# Patient Record
Sex: Male | Born: 1940 | Race: Black or African American | Hispanic: No | Smoking: Never smoker
Health system: Southern US, Community
[De-identification: ages and names within clinical notes are randomized; demographics above are authoritative.]

## PROBLEM LIST (undated history)

## (undated) DIAGNOSIS — G4730 Sleep apnea, unspecified: Secondary | ICD-10-CM

## (undated) DIAGNOSIS — M109 Gout, unspecified: Secondary | ICD-10-CM

## (undated) DIAGNOSIS — N529 Male erectile dysfunction, unspecified: Secondary | ICD-10-CM

## (undated) DIAGNOSIS — K219 Gastro-esophageal reflux disease without esophagitis: Secondary | ICD-10-CM

## (undated) DIAGNOSIS — E785 Hyperlipidemia, unspecified: Secondary | ICD-10-CM

## (undated) DIAGNOSIS — I10 Essential (primary) hypertension: Secondary | ICD-10-CM

## (undated) DIAGNOSIS — J449 Chronic obstructive pulmonary disease, unspecified: Secondary | ICD-10-CM

## (undated) DIAGNOSIS — I2510 Atherosclerotic heart disease of native coronary artery without angina pectoris: Secondary | ICD-10-CM

## (undated) DIAGNOSIS — K6289 Other specified diseases of anus and rectum: Secondary | ICD-10-CM

## (undated) DIAGNOSIS — R0600 Dyspnea, unspecified: Secondary | ICD-10-CM

## (undated) DIAGNOSIS — E119 Type 2 diabetes mellitus without complications: Secondary | ICD-10-CM

## (undated) DIAGNOSIS — D649 Anemia, unspecified: Secondary | ICD-10-CM

## (undated) DIAGNOSIS — Z87891 Personal history of nicotine dependence: Secondary | ICD-10-CM

## (undated) DIAGNOSIS — F411 Generalized anxiety disorder: Secondary | ICD-10-CM

## (undated) DIAGNOSIS — M1990 Unspecified osteoarthritis, unspecified site: Secondary | ICD-10-CM

## (undated) DIAGNOSIS — K921 Melena: Secondary | ICD-10-CM

## (undated) DIAGNOSIS — J4489 Other specified chronic obstructive pulmonary disease: Secondary | ICD-10-CM

## (undated) DIAGNOSIS — I25810 Atherosclerosis of coronary artery bypass graft(s) without angina pectoris: Secondary | ICD-10-CM

## (undated) HISTORY — PX: KNEE SURGERY: SHX244

## (undated) HISTORY — DX: Gastro-esophageal reflux disease without esophagitis: K21.9

## (undated) HISTORY — DX: Other specified chronic obstructive pulmonary disease: J44.89

## (undated) HISTORY — PX: ROTATOR CUFF REPAIR: SHX139

## (undated) HISTORY — DX: Hyperlipidemia, unspecified: E78.5

## (undated) HISTORY — DX: Generalized anxiety disorder: F41.1

## (undated) HISTORY — DX: Unspecified osteoarthritis, unspecified site: M19.90

## (undated) HISTORY — DX: Type 2 diabetes mellitus without complications: E11.9

## (undated) HISTORY — DX: Essential (primary) hypertension: I10

## (undated) HISTORY — DX: Chronic obstructive pulmonary disease, unspecified: J44.9

## (undated) HISTORY — DX: Personal history of nicotine dependence: Z87.891

## (undated) HISTORY — DX: Male erectile dysfunction, unspecified: N52.9

## (undated) HISTORY — DX: Atherosclerosis of coronary artery bypass graft(s) without angina pectoris: I25.810

## (undated) HISTORY — DX: Gout, unspecified: M10.9

## (undated) HISTORY — DX: Other specified diseases of anus and rectum: K62.89

## (undated) HISTORY — DX: Melena: K92.1

---

## 1995-04-10 HISTORY — PX: CORONARY ARTERY BYPASS GRAFT: SHX141

## 1995-12-05 ENCOUNTER — Encounter: Payer: Self-pay | Admitting: Gastroenterology

## 1995-12-26 ENCOUNTER — Encounter: Payer: Self-pay | Admitting: Gastroenterology

## 2000-03-27 ENCOUNTER — Encounter: Payer: Self-pay | Admitting: Internal Medicine

## 2000-03-27 ENCOUNTER — Inpatient Hospital Stay (HOSPITAL_COMMUNITY): Admission: AD | Admit: 2000-03-27 | Discharge: 2000-03-28 | Payer: Self-pay | Admitting: Internal Medicine

## 2000-03-28 ENCOUNTER — Encounter: Payer: Self-pay | Admitting: Internal Medicine

## 2001-06-19 ENCOUNTER — Ambulatory Visit (HOSPITAL_BASED_OUTPATIENT_CLINIC_OR_DEPARTMENT_OTHER): Admission: RE | Admit: 2001-06-19 | Discharge: 2001-06-19 | Payer: Self-pay | Admitting: Pulmonary Disease

## 2004-02-18 ENCOUNTER — Ambulatory Visit: Payer: Self-pay | Admitting: Pulmonary Disease

## 2004-03-14 ENCOUNTER — Ambulatory Visit: Payer: Self-pay | Admitting: Internal Medicine

## 2004-04-05 ENCOUNTER — Ambulatory Visit: Payer: Self-pay | Admitting: Internal Medicine

## 2004-04-11 ENCOUNTER — Ambulatory Visit: Payer: Self-pay | Admitting: *Deleted

## 2004-04-19 ENCOUNTER — Ambulatory Visit: Payer: Self-pay

## 2004-06-12 ENCOUNTER — Ambulatory Visit: Payer: Self-pay | Admitting: Internal Medicine

## 2004-07-13 ENCOUNTER — Ambulatory Visit: Payer: Self-pay | Admitting: Internal Medicine

## 2004-07-18 ENCOUNTER — Ambulatory Visit: Payer: Self-pay | Admitting: Pulmonary Disease

## 2004-10-12 ENCOUNTER — Ambulatory Visit: Payer: Self-pay | Admitting: Internal Medicine

## 2004-10-16 ENCOUNTER — Ambulatory Visit: Payer: Self-pay | Admitting: *Deleted

## 2004-11-28 ENCOUNTER — Ambulatory Visit: Payer: Self-pay | Admitting: *Deleted

## 2005-01-11 ENCOUNTER — Ambulatory Visit: Payer: Self-pay | Admitting: Internal Medicine

## 2005-01-16 ENCOUNTER — Ambulatory Visit: Payer: Self-pay | Admitting: Pulmonary Disease

## 2005-04-11 ENCOUNTER — Ambulatory Visit: Payer: Self-pay | Admitting: Internal Medicine

## 2005-04-18 ENCOUNTER — Ambulatory Visit: Payer: Self-pay | Admitting: *Deleted

## 2005-04-20 ENCOUNTER — Ambulatory Visit: Payer: Self-pay | Admitting: *Deleted

## 2005-07-17 ENCOUNTER — Ambulatory Visit: Payer: Self-pay | Admitting: Internal Medicine

## 2005-09-12 ENCOUNTER — Ambulatory Visit: Payer: Self-pay | Admitting: Pulmonary Disease

## 2005-10-03 ENCOUNTER — Ambulatory Visit (HOSPITAL_COMMUNITY): Admission: RE | Admit: 2005-10-03 | Discharge: 2005-10-03 | Payer: Self-pay | Admitting: Orthopedic Surgery

## 2005-10-16 ENCOUNTER — Ambulatory Visit: Payer: Self-pay | Admitting: Internal Medicine

## 2005-11-06 ENCOUNTER — Ambulatory Visit: Payer: Self-pay | Admitting: *Deleted

## 2005-11-07 ENCOUNTER — Ambulatory Visit: Payer: Self-pay

## 2005-11-12 ENCOUNTER — Ambulatory Visit: Payer: Self-pay | Admitting: Internal Medicine

## 2005-12-17 ENCOUNTER — Ambulatory Visit: Payer: Self-pay | Admitting: *Deleted

## 2005-12-18 ENCOUNTER — Ambulatory Visit: Payer: Self-pay | Admitting: *Deleted

## 2006-01-28 ENCOUNTER — Ambulatory Visit: Payer: Self-pay | Admitting: *Deleted

## 2006-01-28 LAB — CONVERTED CEMR LAB
ALT: 29 units/L (ref 0–40)
AST: 32 units/L (ref 0–37)
Albumin: 3.9 g/dL (ref 3.5–5.2)
Alkaline Phosphatase: 77 units/L (ref 39–117)
BUN: 8 mg/dL (ref 6–23)
CO2: 31 meq/L (ref 19–32)
Calcium: 8.6 mg/dL (ref 8.4–10.5)
Chloride: 110 meq/L (ref 96–112)
Chol/HDL Ratio, serum: 4.1
Cholesterol: 122 mg/dL (ref 0–200)
Creatinine, Ser: 0.9 mg/dL (ref 0.4–1.5)
GFR calc non Af Amer: 90 mL/min
Glomerular Filtration Rate, Af Am: 109 mL/min/{1.73_m2}
Glucose, Bld: 142 mg/dL — ABNORMAL HIGH (ref 70–99)
HDL: 29.8 mg/dL — ABNORMAL LOW (ref >39.0)
LDL Cholesterol: 70 mg/dL (ref 0–99)
Potassium: 4.1 meq/L (ref 3.5–5.1)
Sodium: 146 meq/L — ABNORMAL HIGH (ref 135–145)
Total Bilirubin: 0.5 mg/dL (ref 0.3–1.2)
Total Protein: 6.3 g/dL (ref 6.0–8.3)
Triglyceride fasting, serum: 109 mg/dL (ref 0–149)
VLDL: 22 mg/dL (ref 0–40)

## 2006-03-04 ENCOUNTER — Ambulatory Visit: Payer: Self-pay | Admitting: Internal Medicine

## 2006-05-17 ENCOUNTER — Ambulatory Visit: Payer: Self-pay | Admitting: *Deleted

## 2006-06-11 ENCOUNTER — Ambulatory Visit: Payer: Self-pay | Admitting: Internal Medicine

## 2006-08-21 ENCOUNTER — Ambulatory Visit: Payer: Self-pay | Admitting: *Deleted

## 2006-08-21 LAB — CONVERTED CEMR LAB
Albumin: 3.8 g/dL (ref 3.5–5.2)
Alkaline Phosphatase: 82 units/L (ref 39–117)
BUN: 9 mg/dL (ref 6–23)
CO2: 32 meq/L (ref 19–32)
Cholesterol: 118 mg/dL (ref 0–200)
GFR calc Af Amer: 109 mL/min
GFR calc non Af Amer: 90 mL/min
HDL: 25.1 mg/dL — ABNORMAL LOW (ref >39.0)
Potassium: 3.6 meq/L (ref 3.5–5.1)
Total Protein: 6.3 g/dL (ref 6.0–8.3)
VLDL: 17 mg/dL (ref 0–40)

## 2006-08-28 ENCOUNTER — Ambulatory Visit: Payer: Self-pay

## 2006-09-11 ENCOUNTER — Ambulatory Visit: Payer: Self-pay | Admitting: Internal Medicine

## 2006-09-13 ENCOUNTER — Ambulatory Visit: Payer: Self-pay | Admitting: Pulmonary Disease

## 2006-12-03 ENCOUNTER — Ambulatory Visit: Payer: Self-pay | Admitting: Internal Medicine

## 2007-02-01 ENCOUNTER — Encounter: Payer: Self-pay | Admitting: Internal Medicine

## 2007-02-01 DIAGNOSIS — M109 Gout, unspecified: Secondary | ICD-10-CM | POA: Insufficient documentation

## 2007-02-01 DIAGNOSIS — K219 Gastro-esophageal reflux disease without esophagitis: Secondary | ICD-10-CM

## 2007-02-01 DIAGNOSIS — E785 Hyperlipidemia, unspecified: Secondary | ICD-10-CM | POA: Insufficient documentation

## 2007-02-01 DIAGNOSIS — M1990 Unspecified osteoarthritis, unspecified site: Secondary | ICD-10-CM

## 2007-02-01 DIAGNOSIS — I10 Essential (primary) hypertension: Secondary | ICD-10-CM | POA: Insufficient documentation

## 2007-03-04 ENCOUNTER — Ambulatory Visit: Payer: Self-pay | Admitting: Internal Medicine

## 2007-03-04 DIAGNOSIS — G4700 Insomnia, unspecified: Secondary | ICD-10-CM

## 2007-03-05 LAB — CONVERTED CEMR LAB
Albumin: 4 g/dL (ref 3.5–5.2)
Creatinine, Ser: 0.9 mg/dL (ref 0.4–1.5)
HDL: 26.3 mg/dL — ABNORMAL LOW (ref >39.0)
Hgb A1c MFr Bld: 6.5 % — ABNORMAL HIGH (ref 4.6–6.0)
Potassium: 4.2 meq/L (ref 3.5–5.1)
Sodium: 146 meq/L — ABNORMAL HIGH (ref 135–145)
TSH: 0.55 microintl units/mL (ref 0.35–5.50)
Total Bilirubin: 1.1 mg/dL (ref 0.3–1.2)
Total CHOL/HDL Ratio: 4.5
Total Protein: 6.7 g/dL (ref 6.0–8.3)
Triglycerides: 127 mg/dL (ref 0–149)
VLDL: 25 mg/dL (ref 0–40)

## 2007-04-25 ENCOUNTER — Ambulatory Visit: Payer: Self-pay | Admitting: Internal Medicine

## 2007-04-28 ENCOUNTER — Ambulatory Visit: Payer: Self-pay | Admitting: Internal Medicine

## 2007-04-30 ENCOUNTER — Ambulatory Visit: Payer: Self-pay | Admitting: Cardiovascular Disease

## 2007-05-01 LAB — CONVERTED CEMR LAB
AST: 19 units/L (ref 0–37)
Albumin: 3.7 g/dL (ref 3.5–5.2)
Alkaline Phosphatase: 81 units/L (ref 39–117)
BUN: 10 mg/dL (ref 6–23)
Bacteria, UA: NEGATIVE
Chloride: 105 meq/L (ref 96–112)
Creatinine, Ser: 1 mg/dL (ref 0.4–1.5)
Leukocytes, UA: NEGATIVE
Nitrite: NEGATIVE
PSA: 1.28 ng/mL (ref 0.10–4.00)
Potassium: 4.3 meq/L (ref 3.5–5.1)
Specific Gravity, Urine: 1.02 (ref 1.000–1.03)
Total Bilirubin: 0.9 mg/dL (ref 0.3–1.2)
Total Protein, Urine: 100 mg/dL — AB
Triglycerides: 169 mg/dL — ABNORMAL HIGH (ref 0–149)

## 2007-05-20 DIAGNOSIS — F411 Generalized anxiety disorder: Secondary | ICD-10-CM | POA: Insufficient documentation

## 2007-05-21 ENCOUNTER — Ambulatory Visit: Payer: Self-pay | Admitting: Pulmonary Disease

## 2007-05-21 DIAGNOSIS — J449 Chronic obstructive pulmonary disease, unspecified: Secondary | ICD-10-CM | POA: Insufficient documentation

## 2007-05-25 ENCOUNTER — Encounter: Payer: Self-pay | Admitting: Internal Medicine

## 2007-06-06 ENCOUNTER — Encounter: Payer: Self-pay | Admitting: Internal Medicine

## 2007-08-25 ENCOUNTER — Ambulatory Visit: Payer: Self-pay | Admitting: Internal Medicine

## 2007-08-25 DIAGNOSIS — R498 Other voice and resonance disorders: Secondary | ICD-10-CM

## 2007-09-29 ENCOUNTER — Encounter: Payer: Self-pay | Admitting: Internal Medicine

## 2007-09-30 ENCOUNTER — Telehealth (INDEPENDENT_AMBULATORY_CARE_PROVIDER_SITE_OTHER): Payer: Self-pay | Admitting: *Deleted

## 2007-10-06 ENCOUNTER — Encounter: Payer: Self-pay | Admitting: Internal Medicine

## 2007-12-01 ENCOUNTER — Encounter (INDEPENDENT_AMBULATORY_CARE_PROVIDER_SITE_OTHER): Payer: Self-pay | Admitting: *Deleted

## 2007-12-01 ENCOUNTER — Ambulatory Visit: Payer: Self-pay | Admitting: Internal Medicine

## 2007-12-03 LAB — CONVERTED CEMR LAB
GFR calc Af Amer: 96 mL/min
Glucose, Bld: 209 mg/dL — ABNORMAL HIGH (ref 70–99)
Hgb A1c MFr Bld: 6.6 % — ABNORMAL HIGH (ref 4.6–6.0)
Potassium: 4.6 meq/L (ref 3.5–5.1)
Sodium: 150 meq/L — ABNORMAL HIGH (ref 135–145)
TSH: 0.66 microintl units/mL (ref 0.35–5.50)

## 2007-12-12 ENCOUNTER — Encounter: Payer: Self-pay | Admitting: Internal Medicine

## 2008-01-21 ENCOUNTER — Ambulatory Visit: Payer: Self-pay | Admitting: Internal Medicine

## 2008-01-21 DIAGNOSIS — L600 Ingrowing nail: Secondary | ICD-10-CM

## 2008-01-30 ENCOUNTER — Ambulatory Visit: Payer: Self-pay | Admitting: Gastroenterology

## 2008-02-02 ENCOUNTER — Telehealth: Payer: Self-pay | Admitting: Gastroenterology

## 2008-02-24 ENCOUNTER — Encounter: Payer: Self-pay | Admitting: Gastroenterology

## 2008-02-24 ENCOUNTER — Ambulatory Visit: Payer: Self-pay | Admitting: Gastroenterology

## 2008-02-24 LAB — HM COLONOSCOPY

## 2008-03-01 ENCOUNTER — Encounter: Payer: Self-pay | Admitting: Gastroenterology

## 2008-03-07 ENCOUNTER — Emergency Department (HOSPITAL_COMMUNITY): Admission: EM | Admit: 2008-03-07 | Discharge: 2008-03-07 | Payer: Self-pay | Admitting: Family Medicine

## 2008-03-16 ENCOUNTER — Ambulatory Visit: Payer: Self-pay | Admitting: Pulmonary Disease

## 2008-04-30 ENCOUNTER — Ambulatory Visit: Payer: Self-pay | Admitting: Cardiovascular Disease

## 2008-04-30 ENCOUNTER — Encounter: Payer: Self-pay | Admitting: Cardiovascular Disease

## 2008-04-30 DIAGNOSIS — I2510 Atherosclerotic heart disease of native coronary artery without angina pectoris: Secondary | ICD-10-CM | POA: Insufficient documentation

## 2008-04-30 DIAGNOSIS — F528 Other sexual dysfunction not due to a substance or known physiological condition: Secondary | ICD-10-CM | POA: Insufficient documentation

## 2008-06-18 ENCOUNTER — Encounter: Payer: Self-pay | Admitting: Internal Medicine

## 2008-10-15 ENCOUNTER — Ambulatory Visit: Payer: Self-pay | Admitting: Internal Medicine

## 2008-10-15 DIAGNOSIS — H9190 Unspecified hearing loss, unspecified ear: Secondary | ICD-10-CM | POA: Insufficient documentation

## 2008-10-15 DIAGNOSIS — H6122 Impacted cerumen, left ear: Secondary | ICD-10-CM

## 2008-10-26 ENCOUNTER — Telehealth: Payer: Self-pay | Admitting: Internal Medicine

## 2008-11-02 ENCOUNTER — Telehealth: Payer: Self-pay | Admitting: Internal Medicine

## 2008-11-16 ENCOUNTER — Encounter: Payer: Self-pay | Admitting: Internal Medicine

## 2009-02-08 ENCOUNTER — Encounter: Payer: Self-pay | Admitting: Internal Medicine

## 2009-02-11 ENCOUNTER — Ambulatory Visit: Payer: Self-pay | Admitting: Internal Medicine

## 2009-02-11 LAB — CONVERTED CEMR LAB
Alkaline Phosphatase: 92 units/L (ref 39–117)
Basophils Absolute: 0 10*3/uL (ref 0.0–0.1)
Bilirubin, Direct: 0.1 mg/dL (ref 0.0–0.3)
CO2: 30 meq/L (ref 19–32)
Calcium: 9.3 mg/dL (ref 8.4–10.5)
Eosinophils Absolute: 0.1 10*3/uL (ref 0.0–0.7)
GFR calc non Af Amer: 95.61 mL/min (ref >60)
HDL: 29.9 mg/dL — ABNORMAL LOW (ref >39.00)
Hgb A1c MFr Bld: 5.8 % (ref 4.6–6.5)
LDL Cholesterol: 70 mg/dL (ref 0–99)
Lymphocytes Relative: 41 % (ref 12.0–46.0)
MCHC: 34.4 g/dL (ref 30.0–36.0)
Monocytes Relative: 10.2 % (ref 3.0–12.0)
Neutrophils Relative %: 45.8 % (ref 43.0–77.0)
Platelets: 140 10*3/uL — ABNORMAL LOW (ref 150.0–400.0)
RDW: 15 % — ABNORMAL HIGH (ref 11.5–14.6)
Sodium: 146 meq/L — ABNORMAL HIGH (ref 135–145)
TSH: 0.55 microintl units/mL (ref 0.35–5.50)
Total Bilirubin: 1 mg/dL (ref 0.3–1.2)
Total CHOL/HDL Ratio: 4
Total Protein: 6.7 g/dL (ref 6.0–8.3)
VLDL: 27.4 mg/dL (ref 0.0–40.0)

## 2009-02-16 ENCOUNTER — Ambulatory Visit: Payer: Self-pay | Admitting: Internal Medicine

## 2009-02-16 DIAGNOSIS — R05 Cough: Secondary | ICD-10-CM | POA: Insufficient documentation

## 2009-02-23 ENCOUNTER — Ambulatory Visit: Payer: Self-pay | Admitting: Internal Medicine

## 2009-02-23 DIAGNOSIS — K6289 Other specified diseases of anus and rectum: Secondary | ICD-10-CM | POA: Insufficient documentation

## 2009-02-23 DIAGNOSIS — Z87891 Personal history of nicotine dependence: Secondary | ICD-10-CM

## 2009-03-08 ENCOUNTER — Telehealth: Payer: Self-pay | Admitting: Internal Medicine

## 2009-03-15 ENCOUNTER — Ambulatory Visit: Payer: Self-pay | Admitting: Internal Medicine

## 2009-04-11 ENCOUNTER — Ambulatory Visit: Payer: Self-pay | Admitting: Cardiovascular Disease

## 2009-06-07 ENCOUNTER — Telehealth: Payer: Self-pay | Admitting: Cardiovascular Disease

## 2009-06-09 ENCOUNTER — Telehealth (INDEPENDENT_AMBULATORY_CARE_PROVIDER_SITE_OTHER): Payer: Self-pay | Admitting: *Deleted

## 2009-07-01 ENCOUNTER — Ambulatory Visit: Payer: Self-pay | Admitting: Pulmonary Disease

## 2009-07-02 DIAGNOSIS — K921 Melena: Secondary | ICD-10-CM | POA: Insufficient documentation

## 2009-07-08 ENCOUNTER — Telehealth (INDEPENDENT_AMBULATORY_CARE_PROVIDER_SITE_OTHER): Payer: Self-pay | Admitting: *Deleted

## 2009-07-11 ENCOUNTER — Ambulatory Visit: Payer: Self-pay | Admitting: Cardiovascular Disease

## 2009-07-13 LAB — CONVERTED CEMR LAB
Chloride: 106 meq/L (ref 96–112)
GFR calc non Af Amer: 95.5 mL/min (ref >60)
Potassium: 3.9 meq/L (ref 3.5–5.1)

## 2009-12-14 ENCOUNTER — Ambulatory Visit: Payer: Self-pay | Admitting: Internal Medicine

## 2009-12-14 LAB — CONVERTED CEMR LAB
CO2: 29 meq/L (ref 19–32)
Calcium: 9.1 mg/dL (ref 8.4–10.5)
Chloride: 103 meq/L (ref 96–112)
Creatinine, Ser: 1 mg/dL (ref 0.4–1.5)
Hgb A1c MFr Bld: 6.7 % — ABNORMAL HIGH (ref 4.6–6.5)
Sodium: 141 meq/L (ref 135–145)

## 2009-12-27 ENCOUNTER — Telehealth (INDEPENDENT_AMBULATORY_CARE_PROVIDER_SITE_OTHER): Payer: Self-pay | Admitting: *Deleted

## 2010-01-11 ENCOUNTER — Telehealth (INDEPENDENT_AMBULATORY_CARE_PROVIDER_SITE_OTHER): Payer: Self-pay | Admitting: *Deleted

## 2010-02-01 ENCOUNTER — Telehealth: Payer: Self-pay | Admitting: Internal Medicine

## 2010-02-15 ENCOUNTER — Telehealth (INDEPENDENT_AMBULATORY_CARE_PROVIDER_SITE_OTHER): Payer: Self-pay | Admitting: *Deleted

## 2010-02-20 ENCOUNTER — Ambulatory Visit: Payer: Self-pay | Admitting: Internal Medicine

## 2010-02-20 DIAGNOSIS — M7710 Lateral epicondylitis, unspecified elbow: Secondary | ICD-10-CM | POA: Insufficient documentation

## 2010-03-20 ENCOUNTER — Ambulatory Visit: Payer: Self-pay | Admitting: Internal Medicine

## 2010-03-21 ENCOUNTER — Ambulatory Visit: Payer: Self-pay | Admitting: Internal Medicine

## 2010-03-23 LAB — CONVERTED CEMR LAB
AST: 20 units/L (ref 0–37)
Alkaline Phosphatase: 63 units/L (ref 39–117)
BUN: 12 mg/dL (ref 6–23)
Bilirubin Urine: NEGATIVE
Bilirubin, Direct: 0.1 mg/dL (ref 0.0–0.3)
Chloride: 108 meq/L (ref 96–112)
Cholesterol: 105 mg/dL (ref 0–200)
Eosinophils Absolute: 0.1 10*3/uL (ref 0.0–0.7)
Eosinophils Relative: 2 % (ref 0.0–5.0)
GFR calc non Af Amer: 102.35 mL/min (ref >60.00)
Hgb A1c MFr Bld: 6.4 % (ref 4.6–6.5)
Ketones, ur: NEGATIVE mg/dL
LDL Cholesterol: 64 mg/dL (ref 0–99)
Lymphocytes Relative: 38.7 % (ref 12.0–46.0)
MCHC: 33.8 g/dL (ref 30.0–36.0)
MCV: 82.6 fL (ref 78.0–100.0)
Monocytes Absolute: 0.5 10*3/uL (ref 0.1–1.0)
Neutrophils Relative %: 49.4 % (ref 43.0–77.0)
PSA: 1.31 ng/mL (ref 0.10–4.00)
Platelets: 146 10*3/uL — ABNORMAL LOW (ref 150.0–400.0)
Potassium: 3.9 meq/L (ref 3.5–5.1)
Sodium: 143 meq/L (ref 135–145)
Total Bilirubin: 0.7 mg/dL (ref 0.3–1.2)
Total CHOL/HDL Ratio: 4
Total Protein, Urine: 100 mg/dL
VLDL: 16.8 mg/dL (ref 0.0–40.0)
WBC: 5 10*3/uL (ref 4.5–10.5)
pH: 6 (ref 5.0–8.0)

## 2010-04-18 ENCOUNTER — Encounter: Payer: Self-pay | Admitting: Cardiovascular Disease

## 2010-04-18 ENCOUNTER — Ambulatory Visit
Admission: RE | Admit: 2010-04-18 | Discharge: 2010-04-18 | Payer: Self-pay | Source: Home / Self Care | Attending: Cardiovascular Disease | Admitting: Cardiovascular Disease

## 2010-05-10 ENCOUNTER — Telehealth: Payer: Self-pay | Admitting: Internal Medicine

## 2010-05-11 NOTE — Progress Notes (Signed)
   Request recieved from [facility] sent to [facility]. Joelene Millin Mesiemore  December 27, 2009 10:44 AM     Appended Document:  Request for records received from [facility]. Request forwarded to [facility]. (Plotnikov)  Appended Document:  Requese for Records recieved from [facility] sent to [facility]

## 2010-05-11 NOTE — Assessment & Plan Note (Signed)
Summary: rov/apc   Primary Care Provider:  Tyrone Apple Plotnikov,MD  CC:  61 month ROV & f/u COPD....  History of Present Illness: 70 y/o BM here for a follow up visit... he is followed by DrPlotnikov for primary care, and DrCooper for Cardiology...   ~  March 16, 2008:  returns for Pulm f/u- doing well, no new complaints or concerns... he has stopped his Advair in the interim due to $$$... we discussed switching to [facility] + QVAR to save money...   ~  July 01, 2009:  Pulm f/u & he continues to do well on QVar80 & Proair HFA... he saw DrTJones 11/10 w/ acute bronchitic episode requiring Avelox & cough syrup> resolved... he is an ex-smoker, quit 1997, see prev PFTs below... denies cough, sputum, dyspnea, CP, etc...    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 baseline CXR w/ evid of prev CABG and mild interstitial fibrosis... prev PFT's 11/05 showed FVC=3.22 (59%), FEV1=2.30 (53%), %1sec=71 & mid-flow=40%>> all c/w mild obstruction & poss mild superimposed restrictive dis... he has been stable on QVAR 80- 2sp Bid & PROAIR HFA- 2 sp Bid... states breathing well-  he has min DOE and he walks freq going 2x around the block in 83min, no CP, palpit, etc... denies cough, sputum, hemoptysis, worsening dyspnea,  wheezing, chest pains, snoring, daytime hypersomnolence, etc...  ~  CXR 3/11 showed prior CABG, clear lungs, NAD...  HYPERTENSION (ICD-401.9) - on COREG 25mg Bid, AMLODIPINE 10mg /d, BENAZEPRIL 20mg /d, FUROSEMIDE 20mg /d... CLONIDINE 0.1mg  Bid added 11/10 by DrCooper. CORONARY ARTERY DISEASE (ICD-414.00) - he also takes ASA 81mg /d, & followed by DrCooper- last seen 1/11 (note reviewed)... s/p CABG 1997.  HYPERLIPIDEMIA (ICD-272.4) - on SIMVASTATIN 80mg /d... followed by DrPlotnikov. DIABETES MELLITUS (ICD-250.00) - on METFORMIN 500mg  Bid...  ***GERD (ICD-530.81) - he also has a hx of GERD and LER... he takes  PRILOSEC 20mg Qam and Zantac 300mg Qhs... he has the [facility] elevated and denies reflux symptoms, nocturnal cough, etc...  OSTEOARTHRITIS (ICD-715.90) - on INDOCIN under the direction of DrPlotnikov... GOUT (ICD-274.9) INSOMNIA, PERSISTENT (ICD-307.42) ANXIETY (ICD-300.00)   Allergies (verified): No Known Drug Allergies  Comments:  Nurse/Medical Assistant: The patient's medications and allergies were reviewed with the patient and were updated in the Medication and Allergy Lists.  Past History:  Past Medical History:  COPD, MILD (ICD-496) TOBACCO USE, QUIT (ICD-V15.82) HYPERTENSION (ICD-401.9) CAD, ARTERY BYPASS GRAFT (ICD-414.04) HYPERLIPIDEMIA (ICD-272.4) DIABETES MELLITUS (ICD-250.00) GERD (ICD-530.81) Hx of HEMATOCHEZIA (ICD-578.1) PROCTITIS (ICD-569.49) ERECTILE DYSFUNCTION (ICD-302.72) OSTEOARTHRITIS (ICD-715.90) GOUT (ICD-274.9) ANXIETY (ICD-300.00) INSOMNIA, PERSISTENT (ICD-307.42)  Past Surgical History: Coronary artery bypass graft  97.  LIMA to LAD, sequential saphenous vein graft to the first and second diagonal, sequential saphenous vein graft to the intermediate OM and circumflex and SVG to RCA. Rotator cuff repair Bilateral knee surgery  Family History: Reviewed history from 03/28/2009 and no changes required. Family History of CAD Male 1st degree relative <50 Family History Hypertension No FH of Colon Cancer: Family History of Diabetes:  Sister Family History of Heart Disease: Sister  Social History: Reviewed history from 03/28/2009 and no changes required. Retired Single widower Former Smoker- stopped 1997 Alcohol Use - no-stopped in 1997 Daily Caffeine Use-6 Illicit Drug Use - no Patient does not get regular exercise.   Review of Systems      See HPI  The patient denies anorexia, fever, weight loss, weight gain, vision loss, decreased hearing, hoarseness, chest pain,  syncope, dyspnea on exertion, peripheral edema, prolonged cough, headaches,  hemoptysis, abdominal pain, melena, hematochezia, severe indigestion/heartburn, hematuria, incontinence, muscle weakness, suspicious skin lesions, transient blindness, difficulty walking, depression, unusual weight change, abnormal bleeding, enlarged lymph nodes, and angioedema.    Vital Signs:  Patient profile:   70 year old male Height:      73 inches Weight:      206.50 pounds BMI:     27.34 O2 Sat:      97 % on Room air Temp:     97.6 degrees F oral Pulse rate:   74 / minute BP sitting:   144 / 84  (left arm) Cuff size:   regular  Vitals Entered By: Elita Boone CMA (July 01, 2009 11:53 AM)  O2 Sat at Rest %:  97 O2 Flow:  Room air CC: 16 month ROV & f/u COPD... Is Patient Diabetic? Yes Pain Assessment Patient in pain? no      Comments meds updated today   Physical Exam  Additional Exam:  WD, WN, 70 y/o BM in NAD... GENERAL:  Alert & oriented; pleasant & cooperative... HEENT:  [place]/AT, EOM-wnl, PERRLA, EACs-clear, TMs-wnl, NOSE-clear, THROAT-clear & wnl. NECK:  Supple w/ fairROM; no JVD; normal carotid impulses w/o bruits; no thyromegaly or nodules palpated; no lymphadenopathy. CHEST:  Clear to P & A; without wheezes/ rales/ or rhonchi heard... HEART:  Regular Rhythm; without murmurs/ rubs/ or gallops detected... ABDOMEN:  Soft & nontender; normal bowel sounds; no organomegaly or masses palpated... EXT: without deformities or arthritic changes; no varicose veins/ venous insuffic/ or edema. NEURO:  CN's intact; motor testing normal; sensory testing normal; gait normal & balance OK. DERM:  No lesions noted; no rash etc...    CXR  Procedure date:  07/01/2009  Findings:      CHEST - 2 VIEW Comparison: 02/16/2009   Findings: Heart size appears normal. No pleural effusion or pulmonary edema. No airspace consolidation identified.   IMPRESSION: 1.  No active cardiopulmonary disease.   Read By:  Angelita Ingles,  M.D.       Impression &  Recommendations:  Problem # 1:  COPD, MILD (ICD-496) Clinically stable... continue inhalers... we reviewed his inhaler technique today & decided to go w/ an AEROCHAMBER to affect better deposition & efficacy...  His updated medication list for this problem includes:    Proair Hfa 108 (90 Base) Mcg/act Aers (Albuterol sulfate) .Marland Kitchen... 2 inhalations two times a day    Qvar 80 Mcg/act Aers (Beclomethasone dipropionate) .Marland Kitchen... 2 inhalations two times a day  Orders: T-2 View CXR (71020TC) >> clear, NAD...  Problem # 2:  TOBACCO USE, QUIT (ICD-V15.82) Quit smoking 1997 (at time of his CABG)...   Problem # 3:  CARDIAC>>> Hx HBP, CAD, s/p CABG >> followed by DrCooper & his note 04/11/09 is reviewed...  Problem # 4:  MEDICAL PROBLEMS>>> Hx DM, Hyperchol, DJD, etc >> followed by DrPlotnikov & his note from 02/16/09 & 02/23/09 are reviewed...  Complete Medication List: 1)  Proair Hfa 108 (90 Base) Mcg/act Aers (Albuterol sulfate) .... 2 inhalations two times a day 2)  Qvar 80 Mcg/act Aers (Beclomethasone dipropionate) .... 2 inhalations two times a day 3)  Adult Aspirin Low Strength 81 Mg Tbdp (Aspirin) .... Take 1 tablet by mouth once a day 4)  Carvedilol 25 Mg Tabs (Carvedilol) .Marland Kitchen.. 1 pill by mouth 2 times daily 5)  Amlodipine Besylate 10 Mg Tabs (Amlodipine besylate) .Marland Kitchen.. 1 once daily 6)  Benazepril Hcl 40 Mg  Tabs (Benazepril hcl) .... Take one tablet by mouth daily 7)  Furosemide 20 Mg Tabs (Furosemide) .... Take 1 tablet by mouth once a day 8)  Simvastatin 80 Mg Tabs (Simvastatin) .... Take 1 tab by mouth at bedtime 9)  Metformin Hcl 500 Mg Tabs (Metformin hcl) .Marland Kitchen.. 1 by mouth bid 10)  Omeprazole 20 Mg Cpdr (Omeprazole) .Marland Kitchen.. 1 tab by mouth once daily - taken 30 min before first meal of the day. 11)  Ranitidine Hcl 300 Mg Caps (Ranitidine hcl) .Marland Kitchen.. 1 tab by mouth at bedtime 12)  Indomethacin 25 Mg Caps (Indomethacin) .... 2 tabs po tid as needed for gout attacks 13)  Vitamin D3 1000 Unit Tabs  (Cholecalciferol) .... Take 2 tablets by mouth once daily 14)  Cialis 10 Mg Tabs (Tadalafil) .... Take one tablet by mouth as needed  Patient Instructions: 1)  Today we updated your med list- see below.... 2)  We refilled your Proair & QVar... plus we gave you a spacer device called an Aerochamber to help w/ your inhalers... 3)  We also did a follow up CXR today... please call the "phone tree" in a few days for your results.Marland KitchenMarland Kitchen 4)  Call for any problems.Marland KitchenMarland Kitchen 5)  Please schedule a follow-up appointment in 1 year, sooner as needed. Prescriptions: QVAR 80 MCG/ACT AERS (BECLOMETHASONE DIPROPIONATE) 2 inhalations two times a day  #1 x prn   Entered and Authorized by:   Noralee Space MD   Signed by:   Noralee Space MD on 07/01/2009   Method used:   Print then Give to Patient   RxID:   [id number] PROAIR HFA 108 (90 BASE) MCG/ACT AERS (ALBUTEROL SULFATE) 2 inhalations two times a day  #1 x prn   Entered and Authorized by:   Noralee Space MD   Signed by:   Noralee Space MD on 07/01/2009   Method used:   Print then Give to Patient   RxID:   [id number]

## 2010-05-11 NOTE — Progress Notes (Signed)
Summary: refill  Phone Note Call from Patient Call back at Home Phone [phone]   Caller: Patient Reason for Call: Refill Medication Summary of Call: request refill on indomethacin 25mg , please fax to [facility] on [address], please make pt aware once sent Initial call taken by: Darnell Level,  February 01, 2010 11:36 AM  Follow-up for Phone Call        ok  Additional Follow-up for Phone Call Additional follow up Details #1::        Left vm for patient that rx was called in Additional Follow-up by: Charlsie Quest, CMA,  February 01, 2010 5:42 PM    Prescriptions: INDOMETHACIN 25 MG  CAPS (INDOMETHACIN) 2 tabs po tid as needed for gout attacks  #90 x 3   Entered and Authorized by:   Cassandria Anger MD   Signed by:   Charlsie Quest, CMA on 02/01/2010   Method used:   Electronically to        [facility] (retail)       [address] [place], [place]  [address]       Ph: [phone]       Fax: [phone]   RxID:   [id number]

## 2010-05-11 NOTE — Progress Notes (Signed)
Summary: omeprazole- refilled x 1 only  Phone Note Call from Patient Call back at [facility] Phone [phone]   Caller: Patient Call For: nadel Reason for Call: Refill Medication Summary of Call: Omeprazole rx needs to be called in to [facility] has appt 03/25 Initial call taken by: Zigmund Gottron,  June 09, 2009 8:31 AM  Follow-up for Phone Call        Spoke with pt and made aware that we refilled med x 1 only and he needs to keep appt this month for future refills.  Pt verbalized understanding. Follow-up by: Tilden Dome,  June 09, 2009 9:26 AM    Prescriptions: OMEPRAZOLE 20 MG  CPDR (OMEPRAZOLE) 1 tab by mouth once daily - taken 30 min before first meal of the day.  #30 x 0   Entered by:   Tilden Dome   Authorized by:   Noralee Space MD   Signed by:   Tilden Dome on 06/09/2009   Method used:   Electronically to        [facility] (retail)       [address] [place], [place]  [address]       Ph: [phone]       Fax: [phone]   RxID:   [id number]

## 2010-05-11 NOTE — Assessment & Plan Note (Signed)
Summary: cpx-lb   Vital Signs:  Patient profile:   70 year old male Height:      73 inches Weight:      203 pounds BMI:     26.88 Temp:     96.9 degrees F oral Pulse rate:   72 / minute Pulse rhythm:   regular Resp:     16 per minute BP sitting:   140 / 70  (left arm) Cuff size:   regular  Vitals Entered By: Jonathon Resides, Gabrielle Dare) (March 20, 2010 10:47 AM) CC: [facility]V Is Patient Diabetic? Yes   Primary Care Provider:  Tyrone Apple Plotnikov,MD  CC:  [facility].  History of Present Illness: The patient presents for a preventive health examination  Patient past medical history, social history, and family history reviewed in detail no significant changes.  Patient is physically active. Depression is negative and mood is good. Hearing is normal, and able to perform activities of daily living. Risk of falling is negligible and home safety has been reviewed and is appropriate. Patient has normal height, he is a little overweight, and visual acuity is nl. Patient has been counseled on age-appropriate routine health concerns for screening and prevention. Education, counseling done.  Cognition is  nl. The patient presents for a follow up of hypertension, diabetes, hyperlipidemia, CAD C/o insomnia  Preventive Screening-Counseling & Management  Alcohol-Tobacco     Alcohol drinks/day: 0     Smoking Status: quit     Year Quit: 1997  Caffeine-Diet-Exercise     Caffeine use/day: 1-2     Does Patient Exercise: yes     Type of exercise: walking   Hep-HIV-STD-Contraception     Hepatitis Risk: no risk noted     Dental Visit-last 6 months yes     TSE monthly: no     Sun Exposure-Excessive: no  Safety-Violence-Falls     Seat Belt Use: yes     Helmet Use: n/a     Firearms in the Home: firearms in the home     Smoke Detectors: yes     Violence in the Home: no risk noted     Sexual Abuse: no     Fall Risk: no  Current Medications (verified): 1)  Proair Hfa 108 (90 Base) Mcg/act Aers  (Albuterol Sulfate) .... 2 Inhalations Two Times A Day 2)  Qvar 80 Mcg/act Aers (Beclomethasone Dipropionate) .... 2 Inhalations Two Times A Day 3)  Adult Aspirin Low Strength 81 Mg  Tbdp (Aspirin) .... Take 1 Tablet By Mouth Once A Day 4)  Carvedilol 25 Mg  Tabs (Carvedilol) .Marland Kitchen.. 1 Pill By Mouth 2 Times Daily 5)  Amlodipine Besylate 10 Mg  Tabs (Amlodipine Besylate) .Marland Kitchen.. 1 Once Daily 6)  Benazepril Hcl 40 Mg Tabs (Benazepril Hcl) .... Take One Tablet By Mouth Daily 7)  Furosemide 20 Mg Tabs (Furosemide) .... Take 1 Tablet By Mouth Once A Day 8)  Simvastatin 80 Mg  Tabs (Simvastatin) .... Take 1 Tab By Mouth At Bedtime 9)  Metformin Hcl 500 Mg Tabs (Metformin Hcl) .Marland Kitchen.. 1 By Mouth Bid 10)  Omeprazole 20 Mg  Cpdr (Omeprazole) .Marland Kitchen.. 1 Tab By Mouth Once Daily - Taken 30 Min Before First Meal of The Day. 11)  Ranitidine Hcl 300 Mg  Caps (Ranitidine Hcl) .Marland Kitchen.. 1 Tab By Mouth At Bedtime 12)  Indomethacin 25 Mg  Caps (Indomethacin) .... 2 Tabs Po Tid As Needed For Gout Attacks 13)  Vitamin D3 1000 Unit  Tabs (Cholecalciferol) .... Take 2 Tablets By Mouth  Once Daily 14)  Cialis 10 Mg Tabs (Tadalafil) .... Take One Tablet By Mouth As Needed  Allergies (verified): No Known Drug Allergies  Past History:  Past Medical History: Last updated: 02/20/2010 COPD, MILD (ICD-496) TOBACCO USE, QUIT (ICD-V15.82) HYPERTENSION (ICD-401.9) CAD, ARTERY BYPASS GRAFT (ICD-414.04) HYPERLIPIDEMIA (ICD-272.4) DIABETES MELLITUS (ICD-250.00) GERD (ICD-530.81) Hx of HEMATOCHEZIA (ICD-578.1) PROCTITIS [id number]) ERECTILE DYSFUNCTION (ICD-302.72) OSTEOARTHRITIS (ICD-715.90) GOUT (ICD-274.9) ANXIETY (ICD-300.00)  Past Surgical History: Last updated: 07/01/2009 Coronary artery bypass graft  97.  LIMA to LAD, sequential saphenous vein graft to the first and second diagonal, sequential saphenous vein graft to the intermediate OM and circumflex and SVG to RCA. Rotator cuff repair Bilateral knee surgery  Family  History: Last updated: 03/28/2009 Family History of CAD Male 1st degree relative <50 Family History Hypertension No FH of Colon Cancer: Family History of Diabetes:  Sister Family History of Heart Disease: Sister  Social History: Last updated: 03/28/2009 Retired Single widower Former Smoker- stopped 1997 Alcohol Use - no-stopped in 1997 Daily Caffeine Use-6 Illicit Drug Use - no Patient does not get regular exercise.   Social History: Caffeine use/day:  1-2 Does Patient Exercise:  yes Dental Care w/in 6 mos.:  yes Sun Exposure-Excessive:  no Seat Belt Use:  yes Fall Risk:  no Hepatitis Risk:  no risk noted  Review of Systems  The patient denies anorexia, fever, weight loss, weight gain, vision loss, decreased hearing, hoarseness, chest pain, syncope, dyspnea on exertion, peripheral edema, prolonged cough, headaches, hemoptysis, abdominal pain, melena, hematochezia, severe indigestion/heartburn, hematuria, incontinence, genital sores, muscle weakness, suspicious skin lesions, transient blindness, difficulty walking, depression, unusual weight change, abnormal bleeding, enlarged lymph nodes, angioedema, and breast masses.    Physical Exam  General:  alert, well-developed, well-nourished, well-hydrated, appropriate dress, normal appearance, healthy-appearing, cooperative to examination, and good hygiene.  His voice is raspy but there is no distress. Head:  normocephalic and atraumatic.   Eyes:  No corneal or conjunctival inflammation noted. EOMI. Perrla.  Ears:  R ear normal and L ear normal.   Nose:  no nasal polyps, no nasal mucosal lesions, no mucosal friability, no active bleeding or clots, no sinus percussion tenderness, no septum abnormalities, nasal dischargemucosal pallor, and mucosal edema.   Mouth:  WNL Neck:  supple, full ROM, no masses, no thyromegaly, no JVD, no carotid bruits, no cervical lymphadenopathy, and no neck tenderness.   Lungs:  normal respiratory effort,  no intercostal retractions, no accessory muscle use, and normal breath sounds.   Heart:  normal rate, regular rhythm, no murmur, no gallop, and no rub.   Abdomen:  soft, non-tender, normal bowel sounds, no distention, no masses, no guarding, no hepatomegaly, and no splenomegaly.   Rectal:  No external abnormalities noted. Normal sphincter tone. No rectal masses or tenderness. Genitalia:  Testes bilaterally descended without nodularity, tenderness or masses. No scrotal masses or lesions. No penis lesions or urethral discharge. Prostate:  Prostate gland firm and smooth, no enlargement, nodularity, tenderness, mass, asymmetry or induration. Msk:  normal ROM, no joint tenderness, and no joint swelling.   Pulses:  R and L carotid,radial,femoral,dorsalis pedis and posterior tibial pulses are full and equal bilaterally Extremities:  No clubbing, cyanosis, edema, or deformity noted with normal full range of motion of all joints.   Neurologic:  alert & oriented X3 and cranial nerves II-XII intact.   Skin:  turgor normal, color normal, no rashes, and no suspicious lesions.   Cervical Nodes:  no anterior cervical adenopathy and no posterior cervical adenopathy.  Inguinal Nodes:  no R inguinal adenopathy and no L inguinal adenopathy.   Psych:  Cognition and judgment appear intact. Alert and cooperative with normal attention span and concentration. No apparent delusions, illusions, hallucinations   Impression & Recommendations:  Problem # 1:  HEALTH MAINTENANCE EXAM (ICD-V70.0) Assessment New  Overall doing well, age appropriate education and counseling updated and referral for appropriate preventive services done unless declined, immunizations up to date or declined, diet counseling done if overweight, urged to quit smoking if smokes, most recent labs reviewed and current ordered if appropriate, ecg reviewed or declined (interpretation per ECG scanned in the EMR if done); information regarding Medicare  Preventation requirements given if appropriate.  I have personally reviewed the Medicare Annual Wellness questionnaire and have noted 1.   The patient's medical and social history 2.   Their use of alcohol, tobacco or illicit drugs 3.   Their current medications and supplements 4.   The patient's functional ability including ADL's, fall risks, home safety risks and hearing or visual             impairment. 5.   Diet and physical activities 6.   Evidence for depression or mood disorders The patients weight, height, BMI and visual acuity have been recorded in the chart I have made referrals, counseling and provided education to the patient based review of the above and I have provided the pt with a written personalized care plan for preventive services. He had a colon about 6 years ago  Orders: Medicare -1st Annual Wellness Visit [phone])  Problem # 2:  HYPERLIPIDEMIA (ICD-272.4) Assessment: Unchanged  His updated medication list for this problem includes:    Simvastatin 80 Mg Tabs (Simvastatin) .Marland Kitchen... Take 1 tab by mouth at bedtime  Problem # 3:  HYPERTENSION (ICD-401.9) Assessment: Unchanged  His updated medication list for this problem includes:    Carvedilol 25 Mg Tabs (Carvedilol) .Marland Kitchen... 1 pill by mouth 2 times daily    Amlodipine Besylate 10 Mg Tabs (Amlodipine besylate) .Marland Kitchen... 1 once daily    Benazepril Hcl 40 Mg Tabs (Benazepril hcl) .Marland Kitchen... Take one tablet by mouth daily    Furosemide 20 Mg Tabs (Furosemide) .Marland Kitchen... Take 1 tablet by mouth once a day  Problem # 4:  DIABETES MELLITUS (ICD-250.00) Assessment: Unchanged  His updated medication list for this problem includes:    Adult Aspirin Low Strength 81 Mg Tbdp (Aspirin) .Marland Kitchen... Take 1 tablet by mouth once a day    Benazepril Hcl 40 Mg Tabs (Benazepril hcl) .Marland Kitchen... Take one tablet by mouth daily    Metformin Hcl 500 Mg Tabs (Metformin hcl) .Marland Kitchen... 1 by mouth bid  Labs Reviewed: Creat: 1.0 (12/14/2009)    Reviewed HgBA1c results: 6.7  (12/14/2009)  5.8 (02/11/2009)  Problem # 5:  CORONARY ATHEROSCLEROSIS NATIVE CORONARY ARTERY (ICD-414.01) Assessment: Unchanged Card appt is pending in Jan His updated medication list for this problem includes:    Adult Aspirin Low Strength 81 Mg Tbdp (Aspirin) .Marland Kitchen... Take 1 tablet by mouth once a day    Carvedilol 25 Mg Tabs (Carvedilol) .Marland Kitchen... 1 pill by mouth 2 times daily    Amlodipine Besylate 10 Mg Tabs (Amlodipine besylate) .Marland Kitchen... 1 once daily    Benazepril Hcl 40 Mg Tabs (Benazepril hcl) .Marland Kitchen... Take one tablet by mouth daily    Furosemide 20 Mg Tabs (Furosemide) .Marland Kitchen... Take 1 tablet by mouth once a day  Problem # 6:  GOUT (ICD-274.9) Assessment: Improved  Complete Medication List: 1)  Proair Hfa  108 (90 Base) Mcg/act Aers (Albuterol sulfate) .... 2 inhalations two times a day 2)  Qvar 80 Mcg/act Aers (Beclomethasone dipropionate) .... 2 inhalations two times a day 3)  Adult Aspirin Low Strength 81 Mg Tbdp (Aspirin) .... Take 1 tablet by mouth once a day 4)  Carvedilol 25 Mg Tabs (Carvedilol) .Marland Kitchen.. 1 pill by mouth 2 times daily 5)  Amlodipine Besylate 10 Mg Tabs (Amlodipine besylate) .Marland Kitchen.. 1 once daily 6)  Benazepril Hcl 40 Mg Tabs (Benazepril hcl) .... Take one tablet by mouth daily 7)  Furosemide 20 Mg Tabs (Furosemide) .... Take 1 tablet by mouth once a day 8)  Simvastatin 80 Mg Tabs (Simvastatin) .... Take 1 tab by mouth at bedtime 9)  Metformin Hcl 500 Mg Tabs (Metformin hcl) .Marland Kitchen.. 1 by mouth bid 10)  Omeprazole 20 Mg Cpdr (Omeprazole) .Marland Kitchen.. 1 tab by mouth once daily - taken 30 min before first meal of the day. 11)  Ranitidine Hcl 300 Mg Caps (Ranitidine hcl) .Marland Kitchen.. 1 tab by mouth at bedtime 12)  Indomethacin 25 Mg Caps (Indomethacin) .... 2 tabs po tid as needed for gout attacks 13)  Vitamin D3 1000 Unit Tabs (Cholecalciferol) .... Take 2 tablets by mouth once daily 14)  Cialis 10 Mg Tabs (Tadalafil) .... Take one tablet by mouth as needed 15)  Nortriptyline Hcl 10 Mg Caps  (Nortriptyline hcl) .Marland Kitchen.. 1-2 by mouth qhs 16)  Cialis 5 Mg Tabs (Tadalafil) .Marland Kitchen.. 1 by mouth once daily prn   Patient Instructions: 1)  Please schedule a follow-up appointment in 4 months. 2)  BMP prior to visit, ICD-9: 3)  HbgA1C prior to visit, ICD-9:250.00 4)  Labs this week 5)  v70.0  250.00 6)  BMP prior to visit, ICD-9: 7)  Hepatic Panel prior to visit, ICD-9: 8)  Lipid Panel prior to visit, ICD-9: 9)  TSH prior to visit, ICD-9: 10)  CBC w/ Diff prior to visit, ICD-9: 11)  Urine-dip prior to visit, ICD-9: 12)  PSA prior to visit, ICD-9: 13)  HbgA1C prior to visit, ICD-9: 14)  Urine Microalbumin prior to visit, ICD-9: Prescriptions: NORTRIPTYLINE HCL 10 MG CAPS (NORTRIPTYLINE HCL) 1-2 by mouth qhs  #60 x 6   Entered and Authorized by:   Cassandria Anger MD   Signed by:   Cassandria Anger MD on 03/20/2010   Method used:   Electronically to        [facility] (retail)       [address] [place], [place]  [address]       Ph: [phone]       Fax: [phone]   RxID:   [id number] CIALIS 5 MG TABS (TADALAFIL) 1 by mouth once daily prn  #30 x 0   Entered and Authorized by:   Cassandria Anger MD   Signed by:   Cassandria Anger MD on 03/20/2010   Method used:   Print then Give to Patient   RxID:   [id number] NORTRIPTYLINE HCL 10 MG CAPS (NORTRIPTYLINE HCL) 1-2 by mouth qhs  #60 x 6   Entered and Authorized by:   Cassandria Anger MD   Signed by:   Cassandria Anger MD on 03/20/2010   Method used:   Print then Give to Patient   RxID:   [id number]    Orders Added: 1)  Medicare -1st Annual Wellness Visit [id number] 2)  Est. Patient Level IV [  [id number]   Immunization History:  Pneumovax Immunization History:    Pneumovax:  historical (12/20/2004)   Immunization History:  Pneumovax Immunization History:    Pneumovax:  Historical (12/20/2004)

## 2010-05-11 NOTE — Progress Notes (Signed)
Summary: BP meds  Medications Added FUROSEMIDE 20 MG TABS (FUROSEMIDE)  ADULT ASPIRIN LOW STRENGTH 81 MG  TBDP (ASPIRIN) Take 1 tablet by mouth once a day NIASPAN 500 MG TBCR (NIACIN (ANTIHYPERLIPIDEMIC))  NIASPAN 500 MG TBCR (NIACIN (ANTIHYPERLIPIDEMIC))  PRILOSEC 20 MG CPDR (OMEPRAZOLE)  PRILOSEC 20 MG CPDR (OMEPRAZOLE)  TOPROL XL 100 MG TB24 (METOPROLOL SUCCINATE) Take 1 tablet by mouth once a day TOPROL XL 100 MG TB24 (METOPROLOL SUCCINATE) Take 1 tablet by mouth once a day TOPROL XL 100 MG TB24 (METOPROLOL SUCCINATE)  TOPROL XL 50 MG TB24 (METOPROLOL SUCCINATE)  TOPROL XL 50 MG TB24 (METOPROLOL SUCCINATE)  TRAZODONE HCL 150 MG TABS (TRAZODONE HCL)  TRAZODONE HCL 150 MG TABS (TRAZODONE HCL)  CARVEDILOL 25 MG  TABS (CARVEDILOL) 1 pill by mouth 2 times daily AMLODIPINE BESYLATE 10 MG  TABS (AMLODIPINE BESYLATE) 1 once daily OMEPRAZOLE 20 MG  CPDR (OMEPRAZOLE) once daily RANITIDINE HCL 300 MG  CAPS (RANITIDINE HCL) once daily LOTREL 10-20 MG  CAPS (AMLODIPINE BESY-BENAZEPRIL HCL) once daily LOTREL 10-20 MG  CAPS (AMLODIPINE BESY-BENAZEPRIL HCL) once daily ZOLOFT 50 MG  TABS (SERTRALINE HCL) once daily ZOLOFT 50 MG  TABS (SERTRALINE HCL) once daily BENAZEPRIL HCL 40 MG TABS (BENAZEPRIL HCL) take one tablet by mouth daily SIMVASTATIN 80 MG  TABS (SIMVASTATIN) once daily BENAZEPRIL HCL 20 MG  TABS (BENAZEPRIL HCL) 1 by mouth daily FUROSEMIDE 20 MG TABS (FUROSEMIDE) Take 1 tablet by mouth once a day INDOMETHACIN 25 MG  CAPS (INDOMETHACIN) as needed SIMVASTATIN 80 MG  TABS (SIMVASTATIN) Take 1 tab by mouth at bedtime METFORMIN HCL 500 MG TABS (METFORMIN HCL) 1 by mouth bid NAPROXEN DR 500 MG  TBEC (NAPROXEN) as needed NAPROXEN DR 500 MG  TBEC (NAPROXEN) as needed ROBAXIN 500 MG  TABS (METHOCARBAMOL) as needed ROBAXIN 500 MG  TABS (METHOCARBAMOL) as needed PERCOCET 5-325 MG  TABS (OXYCODONE-ACETAMINOPHEN) as needed PERCOCET 5-325 MG  TABS (OXYCODONE-ACETAMINOPHEN) as needed TEMAZEPAM  30 MG  CAPS (TEMAZEPAM) at bedtime as needed TEMAZEPAM 30 MG  CAPS (TEMAZEPAM) at bedtime as needed ADVAIR DISKUS 250-50 MCG/DOSE  MISC (FLUTICASONE-SALMETEROL) 1 inhalation two times a day ADVAIR DISKUS 250-50 MCG/DOSE  MISC (FLUTICASONE-SALMETEROL) 1 inhalation two times a day OMEPRAZOLE 20 MG  CPDR (OMEPRAZOLE) 1 tab by mouth once daily - taken 30 min before first meal of the day. METFORMIN HCL 500 MG TABS (METFORMIN HCL) 1 by mouth qam NAPROSYN 500 MG TABS (NAPROXEN) 1 two times a day pc prn NAPROSYN 500 MG TABS (NAPROXEN) 1 two times a day pc prn RANITIDINE HCL 300 MG  CAPS (RANITIDINE HCL) 1 tab by mouth at bedtime INDOMETHACIN 25 MG  CAPS (INDOMETHACIN) 2 tabs po tid as needed for gout attacks ADULT ASPIRIN LOW STRENGTH 81 MG  TBDP (ASPIRIN) once daily PROPOXYPHENE N-APAP 100-650 MG  TABS (PROPOXYPHENE N-APAP) as needed PROPOXYPHENE N-APAP 100-650 MG  TABS (PROPOXYPHENE N-APAP) as needed VITAMIN D3 1000 UNIT  TABS (CHOLECALCIFEROL) 1 qd DARVOCET-N 100 100-650 MG TABS (PROPOXYPHENE N-APAP) 1 by mouth four times a day as needed VIAGRA 100 MG  TABS (SILDENAFIL CITRATE) use as directed... VITAMIN D3 1000 UNIT  TABS (CHOLECALCIFEROL) Take 2 tablets by mouth once daily AUGMENTIN 875-125 MG TABS (AMOXICILLIN-POT CLAVULANATE) 1 by mouth bid AUGMENTIN 875-125 MG TABS (AMOXICILLIN-POT CLAVULANATE) 1 by mouth bid MIRALAX   POWD (POLYETHYLENE GLYCOL 3350) As per prep  instructions. REGLAN 10 MG  TABS (METOCLOPRAMIDE HCL) As per prep instructions. DULCOLAX 5 MG  TBEC (BISACODYL) Day before procedure take 2  at 3pm and 2 at 8pm. QVAR 80 MCG/ACT AERS (BECLOMETHASONE DIPROPIONATE) 2 inhalations two times a day PROAIR HFA 108 (90 BASE) MCG/ACT AERS (ALBUTEROL SULFATE) 2 inhalations two times a day AVELOX ABC PACK 400 MG TABS (MOXIFLOXACIN HCL) once daily for 5 days AVELOX ABC PACK 400 MG TABS (MOXIFLOXACIN HCL) once daily for 5 days MYTUSSIN AC 100-10 MG/5ML SYRP (GUAIFENESIN-CODEINE) 5-10 ml by mouth  QID as needed for cough MYTUSSIN AC 100-10 MG/5ML SYRP (GUAIFENESIN-CODEINE) 5-10 ml by mouth QID as needed for cough METRONIDAZOLE 250 MG TABS (METRONIDAZOLE) 1 by mouth qid x 7 d ANUSOL-HC 2.5 % CREA (HYDROCORTISONE) apply to rectum prn ANUSOL-HC 2.5 % CREA (HYDROCORTISONE) apply to rectum prn ANUSOL-HC 25 MG SUPP (HYDROCORTISONE ACETATE) apply to rectum bid ANUSOL-HC 25 MG SUPP (HYDROCORTISONE ACETATE) apply to rectum bid CLONIDINE HCL 0.1 MG TABS (CLONIDINE HCL) Take one tablet by mouth twice a day CLONIDINE HCL 0.1 MG TABS (CLONIDINE HCL) Take one tablet by mouth twice a day CIALIS 10 MG TABS (TADALAFIL) take one tablet by mouth as needed       Phone Note Call from Patient Call back at Home Phone [phone]   Caller: Patient Reason for Call: Talk to Nurse Summary of Call: stopped taking clonidine due to dryness in the mouth and sleepy all the time, since he stopped taking this med all problems have stopped Initial call taken by: Darnell Level,  June 07, 2009 10:45 AM  Follow-up for Phone Call        I spoke with the pt and he stopped Clonidine 3 weeks ago due to dry mouth and drowsiness. Symptoms have resolved since stopping this medication. The pt does not have a way to monitor his BP at home.  I told the pt that I would speak with Dr Burt Knack about his medications and call him back if a medication adjustment was needed.  Theodosia Quay, RN, BSN  June 07, 2009 11:47 AM  Additional Follow-up for Phone Call Additional follow up Details #1::        recommend increase benazepril to 40 mg daily and discontinue clonidine. repeat bmet in one month with dose change of ace-inhibitor Additional Follow-up by: Neale Burly, MD,  June 07, 2009 4:58 PM    Additional Follow-up for Phone Call Additional follow up Details #2::    I spoke with the pt and increased Benazepril to 40mg  once a day.  The pt will have a BMP rechecked on 07/11/09.  Follow-up by: Theodosia Quay, RN, BSN,   June 08, 2009 7:16 PM  New/Updated Medications: BENAZEPRIL HCL 40 MG TABS (BENAZEPRIL HCL) take one tablet by mouth daily Prescriptions: BENAZEPRIL HCL 40 MG TABS (BENAZEPRIL HCL) take one tablet by mouth daily  #30 x 6   Entered by:   Theodosia Quay, RN, BSN   Authorized by:   Neale Burly, MD   Signed by:   Theodosia Quay, RN, BSN on 06/08/2009   Method used:   Electronically to        [facility] (retail)       [address] [place], [place]  [address]       Ph: [phone]       Fax: [phone]   RxID:   [id number]

## 2010-05-11 NOTE — Assessment & Plan Note (Signed)
Summary: f/u appt/#/cd   Vital Signs:  Patient profile:   70 year old male Height:      73 inches Weight:      203 pounds BMI:     26.88 O2 Sat:      97 % on Room air Temp:     98.1 degrees F oral Pulse rate:   67 / minute Pulse rhythm:   regular Resp:     16 per minute BP sitting:   140 / 78  (left arm) Cuff size:   regular  Vitals Entered By: Jonathon Resides, CMA(AAMA) (December 14, 2009 1:11 PM)  O2 Flow:  Room air CC: f/u Is Patient Diabetic? Yes Comments pt is not taking ProAir or Qvar   Primary Care Provider:  Tyrone Apple Plotnikov,MD  CC:  f/u.  History of Present Illness: The patient presents for a follow up of hypertension, CAD, hyperlipidemia, DM   Current Medications (verified): 1)  Proair Hfa 108 (90 Base) Mcg/act Aers (Albuterol Sulfate) .... 2 Inhalations Two Times A Day 2)  Qvar 80 Mcg/act Aers (Beclomethasone Dipropionate) .... 2 Inhalations Two Times A Day 3)  Adult Aspirin Low Strength 81 Mg  Tbdp (Aspirin) .... Take 1 Tablet By Mouth Once A Day 4)  Carvedilol 25 Mg  Tabs (Carvedilol) .Marland Kitchen.. 1 Pill By Mouth 2 Times Daily 5)  Amlodipine Besylate 10 Mg  Tabs (Amlodipine Besylate) .Marland Kitchen.. 1 Once Daily 6)  Benazepril Hcl 40 Mg Tabs (Benazepril Hcl) .... Take One Tablet By Mouth Daily 7)  Furosemide 20 Mg Tabs (Furosemide) .... Take 1 Tablet By Mouth Once A Day 8)  Simvastatin 80 Mg  Tabs (Simvastatin) .... Take 1 Tab By Mouth At Bedtime 9)  Metformin Hcl 500 Mg Tabs (Metformin Hcl) .Marland Kitchen.. 1 By Mouth Bid 10)  Omeprazole 20 Mg  Cpdr (Omeprazole) .Marland Kitchen.. 1 Tab By Mouth Once Daily - Taken 30 Min Before First Meal of The Day. 11)  Ranitidine Hcl 300 Mg  Caps (Ranitidine Hcl) .Marland Kitchen.. 1 Tab By Mouth At Bedtime 12)  Indomethacin 25 Mg  Caps (Indomethacin) .... 2 Tabs Po Tid As Needed For Gout Attacks 13)  Vitamin D3 1000 Unit  Tabs (Cholecalciferol) .... Take 2 Tablets By Mouth Once Daily 14)  Cialis 10 Mg Tabs (Tadalafil) .... Take One Tablet By Mouth As Needed  Allergies  (verified): No Known Drug Allergies  Past History:  Past Medical History: Last updated: 07/01/2009  COPD, MILD (ICD-496) TOBACCO USE, QUIT (ICD-V15.82) HYPERTENSION (ICD-401.9) CAD, ARTERY BYPASS GRAFT (ICD-414.04) HYPERLIPIDEMIA (ICD-272.4) DIABETES MELLITUS (ICD-250.00) GERD (ICD-530.81) Hx of HEMATOCHEZIA (ICD-578.1) PROCTITIS [id number]) ERECTILE DYSFUNCTION (ICD-302.72) OSTEOARTHRITIS (ICD-715.90) GOUT (ICD-274.9) ANXIETY (ICD-300.00) INSOMNIA, PERSISTENT (ICD-307.42)  Social History: Last updated: 03/28/2009 Retired Single widower Former Smoker- stopped 1997 Alcohol Use - no-stopped in 1997 Daily Caffeine Use-6 Illicit Drug Use - no Patient does not get regular exercise.   Review of Systems  The patient denies fever, hoarseness, chest pain, dyspnea on exertion, prolonged cough, abdominal pain, and melena.    Physical Exam  General:  alert, well-developed, well-nourished, well-hydrated, appropriate dress, normal appearance, healthy-appearing, cooperative to examination, and good hygiene.  His voice is raspy but there is no distress. Ears:  R ear normal and L ear normal.   Nose:  no nasal polyps, no nasal mucosal lesions, no mucosal friability, no active bleeding or clots, no sinus percussion tenderness, no septum abnormalities, nasal dischargemucosal pallor, and mucosal edema.   Mouth:  WNL Neck:  supple, full ROM, no masses, no thyromegaly, no  JVD, no carotid bruits, no cervical lymphadenopathy, and no neck tenderness.   Lungs:  normal respiratory effort, no intercostal retractions, no accessory muscle use, and normal breath sounds.   Heart:  normal rate, regular rhythm, no murmur, no gallop, and no rub.   Abdomen:  soft, non-tender, normal bowel sounds, no distention, no masses, no guarding, no hepatomegaly, and no splenomegaly.   Msk:  normal ROM, no joint tenderness, and no joint swelling.   Extremities:  No clubbing, cyanosis, edema, or deformity noted with  normal full range of motion of all joints.   Neurologic:  alert & oriented X3 and cranial nerves II-XII intact.   Skin:  turgor normal, color normal, no rashes, and no suspicious lesions.   Psych:  Cognition and judgment appear intact. Alert and cooperative with normal attention span and concentration. No apparent delusions, illusions, hallucinations   Impression & Recommendations:  Problem # 1:  CORONARY ATHEROSCLEROSIS NATIVE CORONARY ARTERY (ICD-414.01) Assessment Unchanged  His updated medication list for this problem includes:    Adult Aspirin Low Strength 81 Mg Tbdp (Aspirin) .Marland Kitchen... Take 1 tablet by mouth once a day    Carvedilol 25 Mg Tabs (Carvedilol) .Marland Kitchen... 1 pill by mouth 2 times daily    Amlodipine Besylate 10 Mg Tabs (Amlodipine besylate) .Marland Kitchen... 1 once daily    Benazepril Hcl 40 Mg Tabs (Benazepril hcl) .Marland Kitchen... Take one tablet by mouth daily    Furosemide 20 Mg Tabs (Furosemide) .Marland Kitchen... Take 1 tablet by mouth once a day  Problem # 2:  HYPERTENSION (ICD-401.9) Assessment: Unchanged BP OK at home His updated medication list for this problem includes:    Carvedilol 25 Mg Tabs (Carvedilol) .Marland Kitchen... 1 pill by mouth 2 times daily    Amlodipine Besylate 10 Mg Tabs (Amlodipine besylate) .Marland Kitchen... 1 once daily    Benazepril Hcl 40 Mg Tabs (Benazepril hcl) .Marland Kitchen... Take one tablet by mouth daily    Furosemide 20 Mg Tabs (Furosemide) .Marland Kitchen... Take 1 tablet by mouth once a day  Problem # 3:  DIABETES MELLITUS (ICD-250.00) Assessment: Unchanged  His updated medication list for this problem includes:    Adult Aspirin Low Strength 81 Mg Tbdp (Aspirin) .Marland Kitchen... Take 1 tablet by mouth once a day    Benazepril Hcl 40 Mg Tabs (Benazepril hcl) .Marland Kitchen... Take one tablet by mouth daily    Metformin Hcl 500 Mg Tabs (Metformin hcl) .Marland Kitchen... 1 by mouth bid  Orders: TLB-A1C / Hgb A1C (Glycohemoglobin) (83036-A1C) TLB-BMP (Basic Metabolic Panel-BMET) (99991111)  Problem # 4:  GOUT (ICD-274.9) Assessment:  Improved  Problem # 5:  GERD (ICD-530.81) Assessment: Unchanged  His updated medication list for this problem includes:    Omeprazole 20 Mg Cpdr (Omeprazole) .Marland Kitchen... 1 tab by mouth once daily - taken 30 min before first meal of the day.    Ranitidine Hcl 300 Mg Caps (Ranitidine hcl) .Marland Kitchen... 1 tab by mouth at bedtime  Complete Medication List: 1)  Proair Hfa 108 (90 Base) Mcg/act Aers (Albuterol sulfate) .... 2 inhalations two times a day 2)  Qvar 80 Mcg/act Aers (Beclomethasone dipropionate) .... 2 inhalations two times a day 3)  Adult Aspirin Low Strength 81 Mg Tbdp (Aspirin) .... Take 1 tablet by mouth once a day 4)  Carvedilol 25 Mg Tabs (Carvedilol) .Marland Kitchen.. 1 pill by mouth 2 times daily 5)  Amlodipine Besylate 10 Mg Tabs (Amlodipine besylate) .Marland Kitchen.. 1 once daily 6)  Benazepril Hcl 40 Mg Tabs (Benazepril hcl) .... Take one tablet by mouth daily 7)  Furosemide 20 Mg Tabs (Furosemide) .... Take 1 tablet by mouth once a day 8)  Simvastatin 80 Mg Tabs (Simvastatin) .... Take 1 tab by mouth at bedtime 9)  Metformin Hcl 500 Mg Tabs (Metformin hcl) .Marland Kitchen.. 1 by mouth bid 10)  Omeprazole 20 Mg Cpdr (Omeprazole) .Marland Kitchen.. 1 tab by mouth once daily - taken 30 min before first meal of the day. 11)  Ranitidine Hcl 300 Mg Caps (Ranitidine hcl) .Marland Kitchen.. 1 tab by mouth at bedtime 12)  Indomethacin 25 Mg Caps (Indomethacin) .... 2 tabs po tid as needed for gout attacks 13)  Vitamin D3 1000 Unit Tabs (Cholecalciferol) .... Take 2 tablets by mouth once daily 14)  Cialis 10 Mg Tabs (Tadalafil) .... Take one tablet by mouth as needed  Other Orders: Flu Vaccine 31yrs + MEDICARE PATIENTS [id number]) Administration Flu vaccine - MCR [id number])  Patient Instructions: 1)  Please schedule a follow-up appointment in 3 months well w/labs. 2)  HbgA1C prior to visit, ICD-9: 3)  uric acid 995.20  .[email or web address]   Flu Vaccine Consent Questions     Do you have a history of severe allergic reactions to this vaccine? no    Any prior history of  allergic reactions to egg and/or gelatin? no    Do you have a sensitivity to the preservative Thimersol? no    Do you have a past history of Guillan-Barre Syndrome? no    Do you currently have an acute febrile illness? no    Have you ever had a severe reaction to latex? no    Vaccine information given and explained to patient? yes    Are you currently pregnant? no    Lot Number:[id number]   Exp Date:10/07/2010   Site Given  Left Deltoid IM Jonathon Resides, [facility])  December 14, 2009 3:58 PM

## 2010-05-11 NOTE — Assessment & Plan Note (Signed)
Summary: DR AVP PT/NO SLOT--PER PT D/T--L SWOLLEN ELBOW  STC   Vital Signs:  Patient profile:   70 year old male Height:      73 inches (185.42 cm) Weight:      203.8 pounds (92.64 kg) O2 Sat:      96 % on Room air Temp:     98.6 degrees F (37.00 degrees C) oral Pulse rate:   68 / minute BP sitting:   128 / 80  (left arm) Cuff size:   large  Vitals Entered By: Tomma Lightning RMA (February 20, 2010 9:58 AM)  O2 Flow:  Room air CC: Swellign (L) elbow Is Patient Diabetic? No Pain Assessment Patient in pain? no      Comments Pt states about month ago (L) elbow starting swelling & hurting. Sxs subside then 2 weeks ago swelling/pain started again. Pt ? gout. At this moment elbow is not swollen   Primary Care Provider:  Tyrone Apple Plotnikov,MD  CC:  Swellign (L) elbow.  History of Present Illness: here with c/o left elbow pain has occured x 2 in past 4 weeks but no symptoms at present time (last was 1 week ago) desrcibes as pain and swelling  over lateral elbow - no precipitating trauma or repetitive activity - improved with aleve or advil - different pain than usual gout symptoms  no other joints affected when elbow swells  Current Medications (verified): 1)  Proair Hfa 108 (90 Base) Mcg/act Aers (Albuterol Sulfate) .... 2 Inhalations Two Times A Day 2)  Qvar 80 Mcg/act Aers (Beclomethasone Dipropionate) .... 2 Inhalations Two Times A Day 3)  Adult Aspirin Low Strength 81 Mg  Tbdp (Aspirin) .... Take 1 Tablet By Mouth Once A Day 4)  Carvedilol 25 Mg  Tabs (Carvedilol) .Marland Kitchen.. 1 Pill By Mouth 2 Times Daily 5)  Amlodipine Besylate 10 Mg  Tabs (Amlodipine Besylate) .Marland Kitchen.. 1 Once Daily 6)  Benazepril Hcl 40 Mg Tabs (Benazepril Hcl) .... Take One Tablet By Mouth Daily 7)  Furosemide 20 Mg Tabs (Furosemide) .... Take 1 Tablet By Mouth Once A Day 8)  Simvastatin 80 Mg  Tabs (Simvastatin) .... Take 1 Tab By Mouth At Bedtime 9)  Metformin Hcl 500 Mg Tabs (Metformin Hcl) .Marland Kitchen.. 1 By Mouth Bid 10)   Omeprazole 20 Mg  Cpdr (Omeprazole) .Marland Kitchen.. 1 Tab By Mouth Once Daily - Taken 30 Min Before First Meal of The Day. 11)  Ranitidine Hcl 300 Mg  Caps (Ranitidine Hcl) .Marland Kitchen.. 1 Tab By Mouth At Bedtime 12)  Indomethacin 25 Mg  Caps (Indomethacin) .... 2 Tabs Po Tid As Needed For Gout Attacks 13)  Vitamin D3 1000 Unit  Tabs (Cholecalciferol) .... Take 2 Tablets By Mouth Once Daily 14)  Cialis 10 Mg Tabs (Tadalafil) .... Take One Tablet By Mouth As Needed  Allergies (verified): No Known Drug Allergies  Past History:  Past Medical History: COPD, MILD (ICD-496) TOBACCO USE, QUIT (ICD-V15.82) HYPERTENSION (ICD-401.9) CAD, ARTERY BYPASS GRAFT (ICD-414.04) HYPERLIPIDEMIA (ICD-272.4) DIABETES MELLITUS (ICD-250.00) GERD (ICD-530.81) Hx of HEMATOCHEZIA (ICD-578.1) PROCTITIS (ICD-569.49) ERECTILE DYSFUNCTION (ICD-302.72) OSTEOARTHRITIS (ICD-715.90) GOUT (ICD-274.9) ANXIETY (ICD-300.00)  Review of Systems  The patient denies fever, chest pain, and headaches.    Physical Exam  General:  alert, well-developed, well-nourished, well-hydrated, appropriate dress, normal appearance, healthy-appearing, cooperative to examination, and good hygiene.  His voice is raspy but there is no distress. Lungs:  normal respiratory effort, no intercostal retractions, no accessory muscle use, and normal breath sounds.   Heart:  normal rate, regular  rhythm, no murmur, no gallop, and no rub.     Shoulder/Elbow Exam  Elbow Exam:    Right:    Inspection:  Normal    Palpation:  Normal    Stability:  stable    Tenderness:  no    Swelling:  no    Erythema:  no    Range of Motion:       Flexion-Active: 135       Extension-Active: 0       Flexion-Passive: 135       Extension-Passive: 0       Elbow Flexion: > 60 seconds    Left:    Inspection:  Normal    Palpation:  Normal    Stability:  stable    Tenderness:  left lateral epicondyle    Swelling:  no    Erythema:  no    Range of Motion:        Flexion-Active: 135       Extension-Active: 0       Flexion-Passive: 135       Extension-Passive: 0       Elbow Flexion: > 60 seconds   Impression & Recommendations:  Problem # 1:  LATERAL EPICONDYLITIS, LEFT (ICD-726.32)  dx described - xray today r/o bony abn - FROM and min pain to palp today explained use elbow band and use indomethacin in place of OTC meds next episode - pt understands and agrees  Orders: T-Elbow Left 2 View (73070TC)  Complete Medication List: 1)  Proair Hfa 108 (90 Base) Mcg/act Aers (Albuterol sulfate) .... 2 inhalations two times a day 2)  Qvar 80 Mcg/act Aers (Beclomethasone dipropionate) .... 2 inhalations two times a day 3)  Adult Aspirin Low Strength 81 Mg Tbdp (Aspirin) .... Take 1 tablet by mouth once a day 4)  Carvedilol 25 Mg Tabs (Carvedilol) .Marland Kitchen.. 1 pill by mouth 2 times daily 5)  Amlodipine Besylate 10 Mg Tabs (Amlodipine besylate) .Marland Kitchen.. 1 once daily 6)  Benazepril Hcl 40 Mg Tabs (Benazepril hcl) .... Take one tablet by mouth daily 7)  Furosemide 20 Mg Tabs (Furosemide) .... Take 1 tablet by mouth once a day 8)  Simvastatin 80 Mg Tabs (Simvastatin) .... Take 1 tab by mouth at bedtime 9)  Metformin Hcl 500 Mg Tabs (Metformin hcl) .Marland Kitchen.. 1 by mouth bid 10)  Omeprazole 20 Mg Cpdr (Omeprazole) .Marland Kitchen.. 1 tab by mouth once daily - taken 30 min before first meal of the day. 11)  Ranitidine Hcl 300 Mg Caps (Ranitidine hcl) .Marland Kitchen.. 1 tab by mouth at bedtime 12)  Indomethacin 25 Mg Caps (Indomethacin) .... 2 tabs po tid as needed for gout attacks 13)  Vitamin D3 1000 Unit Tabs (Cholecalciferol) .... Take 2 tablets by mouth once daily 14)  Cialis 10 Mg Tabs (Tadalafil) .... Take one tablet by mouth as needed  Patient Instructions: 1)  it was good to see you today. 2)  xray ordered today - your results will be called to you after review in 48 hours from the time of test completion 3)  use elbow band as described and indomethacin for next episode or flare - call if  symptoms not improved or getting worse with this treatment 4)  Please schedule a follow-up appointment as needed.   Orders Added: 1)  Est. Patient Level IV [id number] 2)  T-Elbow Left 2 View [id number]

## 2010-05-11 NOTE — Assessment & Plan Note (Signed)
Summary: yearly  Medications Added PRAVASTATIN SODIUM 80 MG TABS (PRAVASTATIN SODIUM) Take one tablet by mouth daily at bedtime HYDRALAZINE HCL 25 MG TABS (HYDRALAZINE HCL) Take one tablet by mouth three times a day      Allergies Added: NKDA  Visit Type:  1 year follow up Primary Provider:  Tyrone Apple Plotnikov,MD  CC:  No complaints.  History of Present Illness: 70 year old gentleman with coronary artery disease and previous bypass surgery presenting for routine followup. Harold Pittman has done remarkably well since his surgery  in 1997. He underwent multivessel CABG that included a LIMA to LAD.   The patient denies chest pain, dyspnea, orthopnea, PND, edema, palpitations, lightheadedness, or syncope.  He walks regularly and has no symptoms with activity.  Current Medications (verified): 1)  Proair Hfa 108 (90 Base) Mcg/act Aers (Albuterol Sulfate) .... 2 Inhalations Two Times A Day 2)  Qvar 80 Mcg/act Aers (Beclomethasone Dipropionate) .... 2 Inhalations Two Times A Day 3)  Adult Aspirin Low Strength 81 Mg  Tbdp (Aspirin) .... Take 1 Tablet By Mouth Once A Day 4)  Carvedilol 25 Mg  Tabs (Carvedilol) .Marland Kitchen.. 1 Pill By Mouth 2 Times Daily 5)  Amlodipine Besylate 10 Mg  Tabs (Amlodipine Besylate) .Marland Kitchen.. 1 Once Daily 6)  Benazepril Hcl 40 Mg Tabs (Benazepril Hcl) .... Take One Tablet By Mouth Daily 7)  Furosemide 20 Mg Tabs (Furosemide) .... Take 1 Tablet By Mouth Once A Day 8)  Simvastatin 80 Mg  Tabs (Simvastatin) .... Take 1 Tab By Mouth At Bedtime 9)  Metformin Hcl 500 Mg Tabs (Metformin Hcl) .Marland Kitchen.. 1 By Mouth Bid 10)  Omeprazole 20 Mg  Cpdr (Omeprazole) .Marland Kitchen.. 1 Tab By Mouth Once Daily - Taken 30 Min Before First Meal of The Day. 11)  Ranitidine Hcl 300 Mg  Caps (Ranitidine Hcl) .Marland Kitchen.. 1 Tab By Mouth At Bedtime 12)  Indomethacin 25 Mg  Caps (Indomethacin) .... 2 Tabs Po Tid As Needed For Gout Attacks 13)  Vitamin D3 1000 Unit  Tabs (Cholecalciferol) .... Take 2 Tablets By Mouth Once  Daily  Allergies (verified): No Known Drug Allergies  Past History:  Past medical history reviewed for relevance to current acute and chronic problems.  Past Medical History: Reviewed history from 02/20/2010 and no changes required. COPD, MILD (ICD-496) TOBACCO USE, QUIT (ICD-V15.82) HYPERTENSION (ICD-401.9) CAD, ARTERY BYPASS GRAFT (ICD-414.04) HYPERLIPIDEMIA (ICD-272.4) DIABETES MELLITUS (ICD-250.00) GERD (ICD-530.81) Hx of HEMATOCHEZIA (ICD-578.1) PROCTITIS (ICD-569.49) ERECTILE DYSFUNCTION (ICD-302.72) OSTEOARTHRITIS (ICD-715.90) GOUT (ICD-274.9) ANXIETY (ICD-300.00)  Review of Systems       Negative except as per HPI   Vital Signs:  Patient profile:   70 year old male Height:      73 inches Weight:      203.50 pounds BMI:     26.95 Pulse rate:   66 / minute Pulse rhythm:   regular Resp:     18 per minute BP sitting:   153 / 82  (left arm) Cuff size:   large  Vitals Entered By: Sidney Ace (April 18, 2010 11:16 AM)  Physical Exam  General:  Pt is alert and oriented, in no acute distress. HEENT: normal Neck: normal carotid upstrokes without bruits, JVP normal Lungs: CTA CV: RRR without murmur or gallop Abd: soft, NT, positive BS, no bruit, no organomegaly Ext: no clubbing, cyanosis, or edema. peripheral pulses 2+ and equal Skin: warm and dry without rash    EKG  Procedure date:  04/18/2010  Findings:      NSR 65 bpm,  nonspecific T wave abnormality.  Impression & Recommendations:  Problem # 1:  CAD, ARTERY BYPASS GRAFT (ICD-414.04) Stable without angina. Continue current medical program as below. See other problems below for med changes.  His updated medication list for this problem includes:    Adult Aspirin Low Strength 81 Mg Tbdp (Aspirin) .Marland Kitchen... Take 1 tablet by mouth once a day    Carvedilol 25 Mg Tabs (Carvedilol) .Marland Kitchen... 1 pill by mouth 2 times daily    Amlodipine Besylate 10 Mg Tabs (Amlodipine besylate) .Marland Kitchen... 1 once daily     Benazepril Hcl 40 Mg Tabs (Benazepril hcl) .Marland Kitchen... Take one tablet by mouth daily  Problem # 2:  HYPERLIPIDEMIA (ICD-272.4) Change simvastatin to pravastatin as he is on amlodipine.  His updated medication list for this problem includes:    Pravastatin Sodium 80 Mg Tabs (Pravastatin sodium) .Marland Kitchen... Take one tablet by mouth daily at bedtime  Orders: EKG w/ Interpretation (93000)  Problem # 3:  HYPERTENSION (ICD-401.9) BP elevated. Pt on multidrug Rx. Add hydralazine 25 mg three times a day.  His updated medication list for this problem includes:    Adult Aspirin Low Strength 81 Mg Tbdp (Aspirin) .Marland Kitchen... Take 1 tablet by mouth once a day    Carvedilol 25 Mg Tabs (Carvedilol) .Marland Kitchen... 1 pill by mouth 2 times daily    Amlodipine Besylate 10 Mg Tabs (Amlodipine besylate) .Marland Kitchen... 1 once daily    Benazepril Hcl 40 Mg Tabs (Benazepril hcl) .Marland Kitchen... Take one tablet by mouth daily    Furosemide 20 Mg Tabs (Furosemide) .Marland Kitchen... Take 1 tablet by mouth once a day    Hydralazine Hcl 25 Mg Tabs (Hydralazine hcl) .Marland Kitchen... Take one tablet by mouth three times a day  Orders: EKG w/ Interpretation (93000)  BP today: 153/82 Prior BP: 140/70 (03/20/2010)  Labs Reviewed: K+: 3.9 (03/22/2010) Creat: : 0.9 (03/22/2010)   Chol: 105 (03/22/2010)   HDL: 24.20 (03/22/2010)   LDL: 64 (03/22/2010)   TG: 84.0 (03/22/2010)  Patient Instructions: 1)  Your physician recommends that you return for a FASTING LIPID AND LIVER Profile in 3 MONTHS (414.02, 272.0, 401.1)--Nothing to eat or drink after midnight, lab opens at 8:30--due 07/24/10--change to 07/11/10 due to labs with Plotnikov 2)  Your physician has recommended you make the following change in your medication: START Hydralazine 25mg  take one tablet by mouth three times a day, STOP Simvastatin, START Pravastatin 80mg  take one by mouth at bedtime  3)  Your physician wants you to follow-up in:  1 YEAR.  You will receive a reminder letter in the mail two months in advance. If you don't  receive a letter, please call our office to schedule the follow-up appointment. Prescriptions: HYDRALAZINE HCL 25 MG TABS (HYDRALAZINE HCL) Take one tablet by mouth three times a day  #270 x 3   Entered by:   Theodosia Quay, RN, BSN   Authorized by:   Neale Burly, MD   Signed by:   Theodosia Quay, RN, BSN on 04/18/2010   Method used:   Electronically to        [facility] (retail)       [address] [place], [place]  [address]       Ph: [phone]       Fax: [phone]   RxID:   [id number] PRAVASTATIN SODIUM 80 MG TABS (PRAVASTATIN SODIUM) Take one tablet by mouth daily at bedtime  #90  x 3   Entered by:   Theodosia Quay, RN, BSN   Authorized by:   Neale Burly, MD   Signed by:   Theodosia Quay, RN, BSN on 04/18/2010   Method used:   Electronically to        [facility] (retail)       [address] [place], [place]  [address]       Ph: [phone]       Fax: [phone]   RxID:   [id number]

## 2010-05-11 NOTE — Assessment & Plan Note (Signed)
Summary: f1y  Medications Added CLONIDINE HCL 0.1 MG TABS (CLONIDINE HCL) Take one tablet by mouth twice a day CIALIS 10 MG TABS (TADALAFIL) take one tablet by mouth as needed      Allergies Added: NKDA  Visit Type:  Follow-up Primary Provider:  Tyrone Apple Plotnikov,MD  CC:  none.  History of Present Illness: 70 year old gentleman with coronary artery disease and previous bypass surgery presenting for routine followup. Harold Pittman has done remarkably well since his surgery  in 1997. He underwent multivessel CABG that included a LIMA to LAD.   The patient denies chest pain, dyspnea, orthopnea, PND, edema, palpitations, lightheadedness, or syncope.  He does not participate in regular exercise.  Current Medications (verified): 1)  Adult Aspirin Low Strength 81 Mg  Tbdp (Aspirin) .... Take 1 Tablet By Mouth Once A Day 2)  Carvedilol 25 Mg  Tabs (Carvedilol) .Marland Kitchen.. 1 Pill By Mouth 2 Times Daily 3)  Amlodipine Besylate 10 Mg  Tabs (Amlodipine Besylate) .Marland Kitchen.. 1 Once Daily 4)  Benazepril Hcl 20 Mg  Tabs (Benazepril Hcl) .Marland Kitchen.. 1 By Mouth Daily 5)  Furosemide 20 Mg Tabs (Furosemide) .... Take 1 Tablet By Mouth Once A Day 6)  Simvastatin 80 Mg  Tabs (Simvastatin) .... Take 1 Tab By Mouth At Bedtime 7)  Metformin Hcl 500 Mg Tabs (Metformin Hcl) .Marland Kitchen.. 1 By Mouth Bid 8)  Omeprazole 20 Mg  Cpdr (Omeprazole) .Marland Kitchen.. 1 Tab By Mouth Once Daily - Taken 30 Min Before First Meal of The Day. 9)  Ranitidine Hcl 300 Mg  Caps (Ranitidine Hcl) .Marland Kitchen.. 1 Tab By Mouth At Bedtime 10)  Indomethacin 25 Mg  Caps (Indomethacin) .... 2 Tabs Po Tid As Needed For Gout Attacks 11)  Darvocet-N 100 100-650 Mg Tabs (Propoxyphene N-Apap) .Marland Kitchen.. 1 By Mouth Four Times A Day As Needed 12)  Viagra 100 Mg  Tabs (Sildenafil Citrate) .... Use As Directed... 13)  Vitamin D3 1000 Unit  Tabs (Cholecalciferol) .... Take 2 Tablets By Mouth Once Daily 14)  Qvar 80 Mcg/act Aers (Beclomethasone Dipropionate) .... 2 Inhalations Two Times A Day 15)   Proair Hfa 108 (90 Base) Mcg/act Aers (Albuterol Sulfate) .... 2 Inhalations Two Times A Day 16)  Metronidazole 250 Mg Tabs (Metronidazole) .Marland Kitchen.. 1 By Mouth Qid X 7 D  Allergies (verified): No Known Drug Allergies  Past History:  Past medical history reviewed for relevance to current acute and chronic problems.  Past Medical History: Current Problems:  CAD, ARTERY BYPASS GRAFT (ICD-414.04) 1997 HYPERTENSION (ICD-401.9) HYPERLIPIDEMIA (ICD-272.4) GERD (ICD-530.81) DIABETES MELLITUS (ICD-250.00) TOBACCO USE, QUIT (ICD-V15.82) ANXIETY (ICD-300.00) HEMATOCHEZIA (ICD-578.1) PROCTITIS (ICD-569.49) COUGH (ICD-786.2) CERUMEN IMPACTION (ICD-380.4) DECREASED HEARING (ICD-389.9) ERECTILE DYSFUNCTION (ICD-302.72) SPECIAL SCREENING FOR MALIGNANT NEOPLASMS COLON (ICD-V76.51) INGROWN TOENAIL, INFECTED (ICD-703.0) WELL ADULT EXAM (ICD-V70.0) HOARSENESS (ICD-784.49) COPD, MILD (ICD-496) OSTEOARTHRITIS (ICD-715.90) GOUT (ICD-274.9) INSOMNIA, PERSISTENT (ICD-307.42)  Review of Systems       positive for erectile dysfunction, otherwise negative except as per history of present illness  Vital Signs:  Patient profile:   70 year old male Height:      73 inches Weight:      205 pounds BMI:     27.14 Pulse rate:   68 / minute Resp:     16 per minute BP sitting:   154 / 86  (right arm)  Vitals Entered By: Levora Angel, CNA (April 11, 2009 9:51 AM)  Physical Exam  General:  Pt is alert and oriented, in no acute distress. HEENT: normal Neck: normal carotid upstrokes  without bruits, JVP normal Lungs: CTA CV: RRR without murmur or gallop Abd: soft, NT, positive BS, no bruit, no organomegaly Ext: no clubbing, cyanosis, or edema. peripheral pulses 2+ and equal Skin: warm and dry without rash    EKG  Procedure date:  04/11/2009  Findings:      Normal sinus rhythm, nonspecific T wave abnormality, heart rate 68  Impression & Recommendations:  Problem # 1:  CAD, ARTERY BYPASS  GRAFT (ICD-414.04) The patient is stable without angina. He is on appropriate medical regimen, which includes aspirin, beta blocker, and ACE inhibitor. Encouraged increased activity and exercise. His updated medication list for this problem includes:    Adult Aspirin Low Strength 81 Mg Tbdp (Aspirin) .Marland Kitchen... Take 1 tablet by mouth once a day    Carvedilol 25 Mg Tabs (Carvedilol) .Marland Kitchen... 1 pill by mouth 2 times daily    Amlodipine Besylate 10 Mg Tabs (Amlodipine besylate) .Marland Kitchen... 1 once daily    Benazepril Hcl 20 Mg Tabs (Benazepril hcl) .Marland Kitchen... 1 by mouth daily  Orders: EKG w/ Interpretation (93000)  Problem # 2:  HYPERTENSION (ICD-401.9) Blood pressure control remains suboptimal. Add clonidine 0.1 mg twice daily. His updated medication list for this problem includes:    Adult Aspirin Low Strength 81 Mg Tbdp (Aspirin) .Marland Kitchen... Take 1 tablet by mouth once a day    Carvedilol 25 Mg Tabs (Carvedilol) .Marland Kitchen... 1 pill by mouth 2 times daily    Amlodipine Besylate 10 Mg Tabs (Amlodipine besylate) .Marland Kitchen... 1 once daily    Benazepril Hcl 20 Mg Tabs (Benazepril hcl) .Marland Kitchen... 1 by mouth daily    Furosemide 20 Mg Tabs (Furosemide) .Marland Kitchen... Take 1 tablet by mouth once a day    Clonidine Hcl 0.1 Mg Tabs (Clonidine hcl) .Marland Kitchen... Take one tablet by mouth twice a day  BP today: 154/86 Prior BP: 164/98 (02/23/2009)  Labs Reviewed: K+: 4.6 (02/11/2009) Creat: : 1.0 (02/11/2009)   Chol: 127 (02/11/2009)   HDL: 29.90 (02/11/2009)   LDL: 70 (02/11/2009)   TG: 137.0 (02/11/2009)  Problem # 3:  HYPERLIPIDEMIA (ICD-272.4) Limited goal with an LDL of 70. His updated medication list for this problem includes:    Simvastatin 80 Mg Tabs (Simvastatin) .Marland Kitchen... Take 1 tab by mouth at bedtime  CHOL: 127 (02/11/2009)   LDL: 70 (02/11/2009)   HDL: 29.90 (02/11/2009)   TG: 137.0 (02/11/2009)  Patient Instructions: 1)  Your physician recommends that you schedule a follow-up appointment in: 12 months.The office will mail a reminder 2 months  prior appointment date. 2)  Your physician has recommended you make the following change in your medication: Start new meication, Clonidine 0.1 mg twice a day. Prescriptions: CIALIS 10 MG TABS (TADALAFIL) take one tablet by mouth as needed  #10 x 6   Entered by:   Carollee Sires, RN, BSN   Authorized by:   Neale Burly, MD   Signed by:   Carollee Sires, RN, BSN on 04/11/2009   Method used:   Print then Give to Patient   RxID:   [id number] CLONIDINE HCL 0.1 MG TABS (CLONIDINE HCL) Take one tablet by mouth twice a day  #60 x 6   Entered by:   Carollee Sires, RN, BSN   Authorized by:   Neale Burly, MD   Signed by:   Carollee Sires, RN, BSN on 04/11/2009   Method used:   Print then Give to Patient   RxID:   [id number]

## 2010-05-11 NOTE — Progress Notes (Signed)
Summary: refill  Phone Note Call from Patient Call back at [facility] Phone [phone]   Caller: Patient Call For: nadel Summary of Call: Need refill for omeprazole 20 mg./[facility] Initial call taken by: Netta Neat,  July 08, 2009 3:41 PM  Follow-up for Phone Call        ov w/ SN 07-01-09.  rx sent to pharmacy, pt is aware. Parke Poisson CNA  July 08, 2009 3:44 PM     Prescriptions: OMEPRAZOLE 20 MG  CPDR (OMEPRAZOLE) 1 tab by mouth once daily - taken 30 min before first meal of the day.  #30 x 6   Entered by:   Parke Poisson CNA   Authorized by:   Noralee Space MD   Signed by:   Parke Poisson CNA on 07/08/2009   Method used:   Electronically to        [facility] (retail)       [address] [place],   [address]       Ph: [phone]       Fax: [phone]   RxID:   [id number]

## 2010-05-11 NOTE — Progress Notes (Signed)
  Phone Note Other Incoming   Request: Send information Summary of Call: Request for records received from [facility]. Request forwarded to [facility]. ( Plotnikov )

## 2010-05-11 NOTE — Progress Notes (Signed)
  Phone Note Other Incoming   Request: Send information Summary of Call: Request for records received from [facility]. Request forwarded to [facility].     

## 2010-05-17 NOTE — Progress Notes (Signed)
Summary: RF   Phone Note Call from Patient   Summary of Call: Pt left vm - req refill to [facility] to [facility]. Says pharm has req refill but med did not give name of med.  Initial call taken by: Charlsie Quest, [place],  May 10, 2010 4:55 PM  Follow-up for Phone Call        left another vm - wants refill - again no name of med.............Marland KitchenCharlsie Quest, CMA  May 10, 2010 4:56 PM     Prescriptions: AMLODIPINE BESYLATE 10 MG  TABS (AMLODIPINE BESYLATE) 1 once daily  #90 x 1   Entered by:   Charlsie Quest, CMA   Authorized by:   Cassandria Anger MD   Signed by:   Charlsie Quest, CMA on 05/10/2010   Method used:   Electronically to        [facility] (retail)       [address] [place], [place]  [address]       Ph: [phone]       Fax: [phone]   RxID:   [id number]

## 2010-05-23 ENCOUNTER — Encounter: Payer: Self-pay | Admitting: Internal Medicine

## 2010-05-30 ENCOUNTER — Telehealth: Payer: Self-pay | Admitting: Internal Medicine

## 2010-05-31 NOTE — Medication Information (Signed)
Summary: Metformin / [facility]  Metformin / [facility]   Imported By: Rise Patience 05/25/2010 16:32:05  _____________________________________________________________________  External Attachment:    Type:   Image     Comment:   External Document

## 2010-06-06 NOTE — Progress Notes (Signed)
Summary: REQ FOR RX  Phone Note Call from Patient Call back at [facility] Phone [phone]   Summary of Call: Patient is requesting rx for cough med. He has "cold" and cough at  night.  Initial call taken by: Charlsie Quest, [place],  May 30, 2010 9:53 AM  Follow-up for Phone Call        OK Follow-up by: Cassandria Anger MD,  May 30, 2010 9:18 PM  Additional Follow-up for Phone Call Additional follow up Details #1::        left vm on pt's hm # Additional Follow-up by: Charlsie Quest, CMA,  May 31, 2010 2:38 PM    New/Updated Medications: PROMETHAZINE-CODEINE 6.25-10 MG/5ML SYRP (PROMETHAZINE-CODEINE) 5-10 ml by mouth q id as needed cough Prescriptions: PROMETHAZINE-CODEINE 6.25-10 MG/5ML SYRP (PROMETHAZINE-CODEINE) 5-10 ml by mouth q id as needed cough  #300 ml x 0   Entered by:   Charlsie Quest, CMA   Authorized by:   Cassandria Anger MD   Signed by:   Charlsie Quest, CMA on 05/31/2010   Method used:   Telephoned to ...       [facility] (retail)       [address] [place], [place]  [address]       Ph: [phone]       Fax: [phone]   RxID:   [id number]

## 2010-06-09 ENCOUNTER — Encounter: Payer: Self-pay | Admitting: Internal Medicine

## 2010-06-15 ENCOUNTER — Encounter: Payer: Self-pay | Admitting: Internal Medicine

## 2010-06-20 NOTE — Medication Information (Signed)
Summary: [facility]  [facility]   Imported By: Phillis Knack 06/12/2010 09:58:03  _____________________________________________________________________  External Attachment:    Type:   Image     Comment:   External Document

## 2010-06-27 NOTE — Letter (Signed)
Summary: [facility]  [facility]   Imported By: Phillis Knack 06/20/2010 12:22:32  _____________________________________________________________________  External Attachment:    Type:   Image     Comment:   External Document

## 2010-07-10 ENCOUNTER — Other Ambulatory Visit (INDEPENDENT_AMBULATORY_CARE_PROVIDER_SITE_OTHER): Payer: Medicare Other

## 2010-07-10 DIAGNOSIS — E119 Type 2 diabetes mellitus without complications: Secondary | ICD-10-CM

## 2010-07-10 LAB — BASIC METABOLIC PANEL
BUN: 17 mg/dL (ref 6–23)
Chloride: 106 mEq/L (ref 96–112)
Potassium: 3.8 mEq/L (ref 3.5–5.1)
Sodium: 141 mEq/L (ref 135–145)

## 2010-07-10 LAB — HEMOGLOBIN A1C: Hgb A1c MFr Bld: 6.8 % — ABNORMAL HIGH (ref 4.6–6.5)

## 2010-07-11 ENCOUNTER — Other Ambulatory Visit: Payer: Medicare Other | Admitting: *Deleted

## 2010-07-17 ENCOUNTER — Other Ambulatory Visit: Payer: Self-pay | Admitting: Cardiovascular Disease

## 2010-07-17 ENCOUNTER — Ambulatory Visit (INDEPENDENT_AMBULATORY_CARE_PROVIDER_SITE_OTHER): Payer: Medicare Other | Admitting: Internal Medicine

## 2010-07-17 ENCOUNTER — Encounter: Payer: Self-pay | Admitting: Internal Medicine

## 2010-07-17 DIAGNOSIS — M109 Gout, unspecified: Secondary | ICD-10-CM

## 2010-07-17 DIAGNOSIS — I25810 Atherosclerosis of coronary artery bypass graft(s) without angina pectoris: Secondary | ICD-10-CM

## 2010-07-17 DIAGNOSIS — I10 Essential (primary) hypertension: Secondary | ICD-10-CM

## 2010-07-17 DIAGNOSIS — E119 Type 2 diabetes mellitus without complications: Secondary | ICD-10-CM

## 2010-07-17 NOTE — Progress Notes (Signed)
  Subjective:    Patient ID: Harold Pittman, male    DOB: Jan 17, 1941, 70 y.o.   MRN: [id number]  HPI  The patient presents for a follow-up of  chronic hypertension, chronic dyslipidemia, type 2 diabetes controlled with medicines    Review of Systems  Constitutional: Negative for diaphoresis.  HENT: Negative for neck stiffness and postnasal drip.   Eyes: Negative for pain.  Respiratory: Negative for chest tightness, shortness of breath and wheezing.   Gastrointestinal: Negative for abdominal distention.  Genitourinary: Negative for dysuria.  Musculoskeletal: Negative for arthralgias.  Neurological: Positive for dizziness.  Psychiatric/Behavioral: Negative for self-injury.       Objective:   Physical Exam  Constitutional: He is oriented to person, place, and time. He appears well-developed.  HENT:  Mouth/Throat: Oropharynx is clear and moist.  Eyes: Conjunctivae are normal. Pupils are equal, round, and reactive to light.  Neck: Normal range of motion. No JVD present. No thyromegaly present.  Cardiovascular: Normal rate, regular rhythm, normal heart sounds and intact distal pulses.  Exam reveals no gallop and no friction rub.   No murmur heard. Pulmonary/Chest: Effort normal and breath sounds normal. No respiratory distress. He has no wheezes. He has no rales. He exhibits no tenderness.  Abdominal: Soft. Bowel sounds are normal. He exhibits no distension and no mass. There is no tenderness. There is no rebound and no guarding.  Musculoskeletal: Normal range of motion. He exhibits no edema and no tenderness.  Lymphadenopathy:    He has no cervical adenopathy.  Neurological: He is alert and oriented to person, place, and time. He has normal reflexes. No cranial nerve deficit. He exhibits normal muscle tone. Coordination normal.  Skin: Skin is warm and dry. No rash noted.  Psychiatric: He has a normal mood and affect. His behavior is normal. Judgment and thought content normal.         Assessment & Plan:  CAD, ARTERY BYPASS GRAFT Cont with meds  DIABETES MELLITUS Cont with meds - labs reviewed  GOUT No gout flare-ups lately  HYPERTENSION Suboptimal control. BP is nl at home per pt

## 2010-07-17 NOTE — Assessment & Plan Note (Signed)
Cont with meds - labs reviewed

## 2010-07-17 NOTE — Assessment & Plan Note (Signed)
Suboptimal control. BP is nl at home per pt

## 2010-07-17 NOTE — Assessment & Plan Note (Signed)
Cont with meds

## 2010-07-17 NOTE — Assessment & Plan Note (Signed)
No gout flare-ups lately

## 2010-07-19 ENCOUNTER — Telehealth: Payer: Self-pay | Admitting: Cardiovascular Disease

## 2010-07-19 MED ORDER — BENAZEPRIL HCL 40 MG PO TABS: ORAL_TABLET | ORAL | Status: DC

## 2010-07-24 ENCOUNTER — Other Ambulatory Visit: Payer: Self-pay | Admitting: Internal Medicine

## 2010-08-22 NOTE — Assessment & Plan Note (Signed)
[place] OFFICE NOTE   NAME:Northrup, WEBBER CURREN                      MRN:          [id number]  DATE:08/21/2006                            DOB:          1941-03-30    Mr. Mcmoore is a very pleasant 70 year old black male with coronary artery  disease, status post coronary artery bypass graft surgery by Dr. Merleen Nicely.  He also has hypertension and hyperlipidemia.  The surgery  included the following.  LIMA to LAD, sequential saphenous vein graft to  the first and second diagonal, sequential saphenous vein graft to the  intermediate OM and circumflex and SVG to RCA.   Most recent stress test, November 07, 2005, revealed low-risk nuclear study  with inferior thinning, but no ischemia.  The EF was 56%.  Patient has  no cardiac symptoms, feeling quite well and remaining quite active.   He was changed from Crestor 20 to Simvastatin 80 last time.  He is also  on omeprazole 81, metoprolol ER 100, Niaspan 500, ranitidine 300,  furosemide 20, Zoloft 50.   He had a BMP, lipids and LFTs performed this morning.  The results are  pending.   PHYSICAL EXAM:  Reveals blood pressure 138/84, pulse 57, normal sinus  rhythm.  GENERAL APPEARANCE:  Normal.  JVP is not elevated.  Carotid pulses palpable and equal, without bruits.  LUNGS:  Clear.  CARDIAC EXAM:  Reveals no murmur or gallop.  ABDOMINAL EXAM:  Unremarkable, except for slight fullness in  epigastrium.  There is no bruit.  EXTREMITIES:  Normal.   EKG:  Sinus bradycardia, minor nonspecific T changes.  This is improved  from the previous EKG of December 18, 2005.   IMPRESSION:  Diagnoses as above.  Patient is one and a half years post-  CABG and is asymptomatic.  His lipids and hypertension are well-  controlled.   We plan abdominal ultrasound to rule out abdominal aneurysm, and I will  have him see Dr. Sherren Mocha for followup in three to four months.     Signa Kell, MD, [facility]     EJL/MedQ  DD: 08/21/2006  DT: 08/21/2006  Job #: [id number]

## 2010-08-22 NOTE — Assessment & Plan Note (Signed)
Harold Pittman   NAME:Harold Pittman, Harold Pittman                      MRN:          [id number]  DATE:04/30/2007                            DOB:          30-Jul-1940    Harold Pittman was seen in follow-up at [facility] office on  April 30, 2007.  Harold Pittman is a very nice 70 year old gentleman with  coronary artery disease status post CABG approximately 10 years ago.  He  had multivessel bypass with a LIMA to LAD and multiple saphenous vein  grafts.  He has done extremely well since bypass surgery.  He is  currently asymptomatic.  He specifically denies chest pain, dyspnea,  edema, orthopnea, PND, palpitations, lightheadedness or syncope.  He is  relatively active.  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  He does not do much regular exercise, but occasionally  walks for exercise.  He had a Myoview stress study in August 2007 that  was low risk.  His LVEF was calculated at 56%.  Harold Pittman follows  regularly with Dr. Alain Marion and was just seen yesterday and had blood  work done.   CURRENT MEDICATIONS:  1. Omeprazole 20 mg.  2. Aspirin 81 mg.  3. Metoprolol ER 100 mg.  4. Ranitidine 300 mg.  5. Furosemide 20 mg.  6. Simvastatin 80 mg.  7. Vitamin D 1000 units.  8. Amlodipine, question dose.   ALLERGIES:  NKDA.   PHYSICAL EXAMINATION:  The patient is alert and oriented.  He is in no  acute distress. Weight is 212, blood pressure 118/64, heart rate 60,  respiratory rate 16.  HEENT:  Normal.  NECK:  Normal carotid upstrokes without bruits.  Jugular venous pressure  is normal.  LUNGS:  Clear to auscultation bilaterally.  HEART:  Regular rate and rhythm without murmurs or gallops.  ABDOMEN: Soft, nontender, no organomegaly.  No bruits.  EXTREMITIES:  No clubbing, cyanosis or edema.  Peripheral pulses 2+ and  equal throughout.   EKG shows normal sinus rhythm with a single PVC.   There are no  significant ST-segment or T-wave changes.  There is a nonspecific T-wave  abnormality present.   ASSESSMENT:  1. Coronary artery disease status post coronary artery bypass graft.      The patient is asymptomatic.  Last stress study approximately 2      years ago.  Continue current medical therapy to include aspirin,      beta blocker and simvastatin.  2. Dyslipidemia.  The patient is on simvastatin 80 mg.  Lipids in May      2008 showed cholesterol of 118, triglycerides 84, HDL 25, LDL 76.      Continue current therapy as directed by Dr. Alain Marion.  3. Hypertension.  Blood pressure is in the ideal range on his current      therapy.   For follow-up I would like to see Harold Pittman back on a yearly basis.  If  he has problems in the interim, I would be happy to see him sooner.     Juanda Bond.  Burt Knack, MD  Electronically Signed    MDC/MedQ  DD: 04/30/2007  DT: 04/30/2007  Job #: [id number]   cc:   Evie Lacks. Plotnikov, MD

## 2010-08-25 NOTE — Discharge Summary (Signed)
[place] [address] [facility]  Patient:    Harold Pittman, Harold Pittman                        MRN: [id number] Adm. Date:  [id number] Disc. Date: 03/28/00 Attending:  Cristopher Peru Dictator:   Richardson Dopp, P.A. CC:         Walker Kehr, M.D. [facility]   Discharge Summary  DATE OF BIRTH:  April 14, 1938  REASON FOR ADMISSION:  Chest discomfort with typical and atypical features.  DISCHARGE DIAGNOSES: 1. Coronary artery disease. 2. Status post coronary artery bypass grafting in September 1997 with    postoperative pneumonia and pulmonary embolism and pneumothorax. 3. Hyperlipidemia. 4. Hypertension. 5. History of tobacco use. 6. Status post bilateral knee surgery.  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  ADMISSION HISTORY:  This 70 year old male with known CAD presented to the office for evaluation of chest discomfort on March 27, 2000.  His cardiac history is notable for a seven vessel CABG by Dr. Redmond Pulling in 1997.  He was complaining of a one week history of chest discomfort.  Initially these were sharp and very brief pains localized to the left anterior chest region occurring at rest.  However, over the past two days he was awakened by a heaviness sensation in the left chest.  He took nitroglycerin.  He noted some relief with nitroglycerin but there was no resolution of his discomfort.  He denied any recent exertional chest discomfort and denied any symptoms suggestive of heart failure.  He denied any recent fever or upper respiratory infection symptoms.  ALLERGIES:  No known drug allergies.  Denied any iodine or shellfish allergies.  PHYSICAL EXAMINATION:  GENERAL:  Well-nourished, well-developed male in no acute distress.  VITAL SIGNS:  Blood pressure 122/74, pulse 70s and regular, weight 205 pounds.  NECK:  Without bruits.  LUNGS:  Clear to  auscultation bilaterally.  CHEST:  No tenderness to palpation.  HEART:  Regular rate and rhythm.  Normal S1, S2.  No murmurs, rubs, or gallops.  ABDOMEN:  Soft, nontender.  EXTREMITIES:  Without edema.  LABORATORIES:  EKG:  Normal sinus rhythm, 70 beats per minute with normal axis, no ischemic changes.  White count 6,900, hemoglobin 12.8, hematocrit 36.8, platelet count 273,000. INR 1.0.  Sodium 138, potassium 3.5, chloride 105, CO2 26, BUN 20, creatinine 1.1, glucose 138.  LFTs normal.  TSH 0.574.  HOSPITAL COURSE:  Patient was admitted for evaluation of chest pain.  He ruled out for myocardial infarction by enzymes.  He went for gaited exercise treadmill Cardiolite on March 28, 2000.  He exercised for six minutes and 30 seconds and achieved a heart rate of 155.  There were no EKG changes.  The images are as above.  Therefore, on the afternoon of March 28, 2000 it was felt he was stable enough for discharge to home.  DISCHARGE MEDICATIONS: 1. Enteric coated aspirin 325 mg q.d. 2. Lipitor 10 mg q.h.s. 3. Lotensin 40 mg q.d. 4. Prilosec 20 mg q.h.s. 5. HCTZ 37.5/25 q.d. 6. Vitamin E. 7. Calcium 500 mg q.d. 8. Nitroglycerin sublingual p.r.n. chest pain.  ACTIVITY:  As tolerated.  DIET:  Low fat, low sodium.  FOLLOW-UP:  Patient is to follow up with Dr. Velora Heckler as scheduled in January 2002.  He is to see Dr. Alain Marion as needed. DD:  03/28/00 TD:  03/28/00 Job: [id number]  [id number]

## 2010-08-25 NOTE — Assessment & Plan Note (Signed)
[place] OFFICE NOTE   NAME:Harold Pittman                      MRN:          [id number]  DATE:05/17/2006                            DOB:          1940-07-26    Mr. Harold Pittman is a very pleasant 70 year old black male with coronary artery  disease, 2-1/2 years post CABG by Dr. Redmond Pulling.  He also has hypertension  and hyperlipidemia.  He is having no cardiac symptoms.  The surgery  included the following grafts:  LIMA to LAD, sequential to the diagonal  #1 and #2, saphenous vein graft to intermediate, OM and circumflex, SVG  to RCA.   At the time of cath, preoperatively the patient had normal abdominal  angiogram.  The most recent stress test, April 19, 2004, revealed no  evidence of ischemia and some mild inferior thinning.   MEDICATIONS:  1. Crestor 20 mg.  2. Amlodipine/benazepril 5/20 mg.  3. Omeprazole 20 mg.  4. Aspirin 81 mg.  5. Ambien.  6. Metoprolol ER 100 mg.  7. Niaspan 500 mg.  8. Ranitidine 300 mg.  9. Furosemide 20 mg.  10.Zoloft 5 mg.   PHYSICAL EXAMINATION:  Blood pressure 148/81, pulse 69 and normal sinus  rhythm.  GENERAL APPEARANCE:  Normal.  JVP is not elevated.  Carotid pulse palpable, without bruits.  LUNGS:  Clear.  CARDIAC:  No murmur or gallop.  ABDOMEN:  Normal.  No masses.  No pulsatile sensation.  Liver, spleen  and kidneys not palpable.  EXTREMITIES:  Normal.   IMPRESSION:  1. Ten and a half years post coronary artery bypass graft x7 with Dr.      Merleen Nicely -- asymptomatic.  2. Hypertension, moderately controlled.  3. Hyperlipidemia, controlled.   PLAN:  We plan to change him from Crestor 20 mg to simvastatin 80 mg  because of cost.  Also, we plan, because of the hypertension, to  increase his Lotrel to 10/20.  I will see him back in 3 months.  Prior  to that, we will get a BMP, lipids and LFTs.     Signa Kell, MD, [facility]  Electronically Signed    EJL/MedQ  DD: 05/17/2006  DT: 05/17/2006  Job #: [id number]

## 2010-08-25 NOTE — Assessment & Plan Note (Signed)
[facility] OFFICE NOTE   NAME:Harold Pittman, Harold Pittman                      MRN:          [id number]  DATE:12/18/2005                            DOB:          Aug 25, 1940    A very pleasant 70 year old black male 10 years post CABG by Dr. Merleen Nicely.  He has had no recurrent symptoms.  He continues to feel well.  He  is status post right shoulder surgery by Dr. Onnie Graham.  He is doing well with  rehabilitation.   He has a history of hypertension and hyperlipidemia and a history of  postoperative pulmonary embolus.   A recent preoperative stress test on November 07, 2005 was a low risk study  with _________ but no ischemia.   MEDICATIONS:  1. Aspirin 325.  2. Multivitamin.  3. Caltrate.  4. Niaspan.  5. Prilosec.  6. Flonase.  7. Advair.  8. Furosemide 40.  9. Diazepam.  10.Toprol-XL 100.   PHYSICAL EXAMINATION:  VITAL SIGNS:  Blood pressure 150/83, pulse 67, normal  sinus rhythm.  GENERAL APPEARANCE:  Normal.  NECK:  The JVP is not elevated.  Carotid pulses are palpable without bruits.  LUNGS:  Clear.  CARDIAC:  Normal.  ABDOMEN:  Normal, no masses.  No pulsation.  The liver, spleen and kidney  not palpable.  EXTREMITIES:  Normal pulses, equal bilaterally, with no edema.   EKG reveals sinus bradycardia, __________ abnormality, more significant  since January of 2007.   IMPRESSION/DIAGNOSIS:  As above.   Because of the hypertension, I suggested Lotrel 5/10.  Also, his LDL is 86.  We may need to increase his Crestor from 15-20.   Will plan to check a BNP and lipid profile and LFT's in 6 weeks.  I will see  him back in 3-4 months.                              Signa Kell, MD, [facility]    EJL/MedQ  DD:  12/18/2005  DT:  12/19/2005  Job #:  [id number]

## 2010-08-25 NOTE — Assessment & Plan Note (Signed)
[facility] OFFICE NOTE   NAME:[name], [name]                      MRN:          [id number]  DATE:11/06/2005                            DOB:          20-Mar-1941    CARDIOLOGY FOLLOWUP AND PRE-OP EVALUATION:  Mr. Harold Pittman is a very  pleasant 70 year old black male with coronary artery disease, 10 years post  CABG by Dr. Redmond Pulling, at which time he had 7 bypasses.  He has had no  recurrent symptoms.  He feels quite well.  In January 2006, he had an  ejection fraction of 58%, mild thinning of antero-septal wall, but no  evidence of ischemia on his stress Myoview.  The patient recently fell  injuring his right shoulder and states he is scheduled to have a right  shoulder surgery for a torn rotator cuff in about 2 weeks.   He is on:  1.  Crestor 10 with fair control.  2.  Aspirin 325.  3.  Caltrate.  4.  Niaspan 500.  5.  Prilosec 20.  6.  Flonase.  7.  Advair.  8.  Toprol XL 100.  9.  Furosemide 40.  10. Diazepam.   Recent LDL was 94.   PHYSICAL EXAMINATION:  VITAL SIGNS:  Blood pressure 144/84, pulse 56 normal  sinus rhythm.  NECK:  JVP is not elevated.  Carotid pulses palpable without bruit.  LUNGS:  Clear.  CARDIAC:  Normal.  ABDOMEN:  Normal.  EXTREMITIES:  Normal.   EKG reveals T wave inversion in the lateral leads suggesting ischemia. This  is new as compared to the EKG of April 18, 2005.   IMPRESSION:  1.  Coronary artery disease, 10 years post coronary artery bypass graft,      asymptomatic however, with new T wave changes.  2.  Hyperlipidemia on therapy.  3.  Hypertension, fairly well controlled.  4.  History of postoperative pulmonary embolus.   I suggest the patient have a stress Myoview, also increase his Crestor to 15  mg a day.  We will review the Myoview before the surgery.                              Signa Kell, MD, [facility]    EJL/MedQ  DD:  11/06/2005  DT:   11/06/2005  Job #:  [id number]

## 2010-08-25 NOTE — Discharge Summary (Signed)
[place]  Patient:    MATIN, HOLK                        MRN: [id number] Adm. Date:  [id number] Disc. Date: 03/28/00 Attending:  Cristopher Peru Dictator:   Richardson Dopp, P.A.                           Discharge Summary  ADDENDUM  Patient had a chest CT performed this admission as well and this was negative pulmonary embolism. DD:  03/28/00 TD:  03/28/00 Job: [id number]

## 2010-09-26 ENCOUNTER — Ambulatory Visit (INDEPENDENT_AMBULATORY_CARE_PROVIDER_SITE_OTHER)
Admission: RE | Admit: 2010-09-26 | Discharge: 2010-09-26 | Disposition: A | Discharge status: Home / Self Care | Payer: Medicare Other | Source: Ambulatory Visit | Attending: Pulmonary Disease | Admitting: Pulmonary Disease

## 2010-09-26 ENCOUNTER — Ambulatory Visit (INDEPENDENT_AMBULATORY_CARE_PROVIDER_SITE_OTHER): Payer: Medicare Other | Admitting: Pulmonary Disease

## 2010-09-26 ENCOUNTER — Other Ambulatory Visit: Payer: Self-pay | Admitting: Pulmonary Disease

## 2010-09-26 ENCOUNTER — Encounter: Payer: Self-pay | Admitting: Pulmonary Disease

## 2010-09-26 VITALS — BP 116/76 | HR 66 | Temp 97.6°F | Ht 73.0 in | Wt 206.8 lb

## 2010-09-26 DIAGNOSIS — F411 Generalized anxiety disorder: Secondary | ICD-10-CM

## 2010-09-26 DIAGNOSIS — E119 Type 2 diabetes mellitus without complications: Secondary | ICD-10-CM

## 2010-09-26 DIAGNOSIS — J449 Chronic obstructive pulmonary disease, unspecified: Secondary | ICD-10-CM

## 2010-09-26 DIAGNOSIS — M1990 Unspecified osteoarthritis, unspecified site: Secondary | ICD-10-CM

## 2010-09-26 DIAGNOSIS — F528 Other sexual dysfunction not due to a substance or known physiological condition: Secondary | ICD-10-CM

## 2010-09-26 DIAGNOSIS — I2510 Atherosclerotic heart disease of native coronary artery without angina pectoris: Secondary | ICD-10-CM

## 2010-09-26 DIAGNOSIS — E785 Hyperlipidemia, unspecified: Secondary | ICD-10-CM

## 2010-09-26 DIAGNOSIS — K219 Gastro-esophageal reflux disease without esophagitis: Secondary | ICD-10-CM

## 2010-09-26 DIAGNOSIS — I10 Essential (primary) hypertension: Secondary | ICD-10-CM

## 2010-09-26 MED ORDER — ALBUTEROL SULFATE HFA 108 (90 BASE) MCG/ACT IN AERS: 2.0000 | INHALATION_SPRAY | Freq: Two times a day (BID) | RESPIRATORY_TRACT | Status: DC

## 2010-09-26 MED ORDER — BECLOMETHASONE DIPROPIONATE 80 MCG/ACT IN AERS: 2.0000 | INHALATION_SPRAY | Freq: Two times a day (BID) | RESPIRATORY_TRACT | Status: DC

## 2010-09-26 MED ORDER — TADALAFIL 20 MG PO TABS: ORAL_TABLET | ORAL | Status: DC

## 2010-09-26 NOTE — Patient Instructions (Signed)
Today we updated your med list in EPIC>    We refllled your QVAR & PROAIR today...  We reviewed the results of your breathing test...  Today we did your follow up CXR as well...    Please call the PHONE TREE in a few days for your results...    Dial [id number] & when prompted enter your patient number followed by the # symbol...    Your patient number is:  [id number]  Call for any problems.Marland KitchenMarland Kitchen

## 2010-09-26 NOTE — Progress Notes (Signed)
Subjective:    Patient ID: Harold Pittman, male    DOB: 12-Mar-1941, 70 y.o.   MRN: [id number]  HPI 70 y/o BM here for a follow up visit... he is followed by DrPlotnikov for primary care, and DrCooper for Cardiology...  ~  March 16, 2008:  returns for Pulm f/u- doing well, no new complaints or concerns... he has stopped his Advair in the interim due to $$$... we discussed switching to [facility] + QVAR to save money...  ~  July 01, 2009:  Pulm f/u & he continues to do well on QVar80 & Proair HFA... he saw DrTJones 11/10 w/ acute bronchitic episode requiring Avelox & cough syrup> resolved... he is an ex-smoker, quit 1997, see prev PFTs below... denies cough, sputum, dyspnea, CP, etc...   ~  September 26, 2010:  33mo ROV & he continues to do well> breathing OK & denies cough, sputum, congestion, dyspnea, edema, etc;  Stable on QVAR 80- 2spBid & Proair prn (ave 1-2 per month, usually w/ exercise);  He continues to see DrPlotnikov for [facility] & DrCooper for Cards;  We discussed f/u PFT & CXR today> see below...   Problem List:   COPD, MILD (ICD-496) - he has mild COPD and is an ex-smoker, having quit in 1997 when he had his CABG... he has been stable on QVAR 80- 2sp Bid & PROAIR HFA- 2 sp Prn... states breathing well-  he has min DOE and he walks freq going 2x around the block in 38min, no CP, palpit, etc... denies cough, sputum, hemoptysis, worsening dyspnea,  wheezing, chest pains, snoring, daytime hypersomnolence, etc... ~  baseline CXR w/ evid of prev CABG and mild interstitial fibrosis...  ~  prev PFT's 11/05 showed FVC=3.22 (59%), FEV1=2.30 (53%), %1sec=71 & mid-flow=40%>> all c/w mild obstruction & poss mild superimposed restrictive dis... ~  CXR 3/11 showed prior CABG, clear lungs, NAD... ~  PFT 6/12 showed FVC=2.90 (68%), FEV1=2.34 (72%), %1sec=81, mid-flows=76%pred... Doing well on QVar & Proair. ~  CXR 6/12 showed prev CABG, norm heart size, clear lungs, NAD...  HYPERTENSION (ICD-401.9) - on  COREG 25mg Bid, AMLODIPINE 10mg /d, BENAZEPRIL 40mg /d, FUROSEMIDE 20mg /d & HYDRALAZINE 25mg  Tid...  CORONARY ARTERY DISEASE (ICD-414.00) - he also takes ASA 81mg /d, s/p CABG 1997, followed regularly by DrCooper for Cards.  HYPERLIPIDEMIA (ICD-272.4) - on PRAVASTATIN 80mg /d... followed by DrPlotnikov.  DIABETES MELLITUS (ICD-250.00) - on METFORMIN 500mg  Bid...  GERD (ICD-530.81) - he also has a hx of GERD and LER... he takes PRILOSEC 20mg Qam and Zantac 300mg Qhs... he has the [facility] elevated and denies reflux symptoms, nocturnal cough, etc...  OSTEOARTHRITIS (ICD-715.90) - on INDOCIN under the direction of DrPlotnikov... GOUT (ICD-274.9)  INSOMNIA, PERSISTENT (ICD-307.42) ANXIETY (ICD-300.00)   Past Surgical History  Procedure Date  . Coronary artery bypass graft 97    LIMA to LAD, sequential saphenous vein graft to the first and second diagonal, sequential saphenous vein graft to the intermediate OM and circumflex and SVG to RCA  . Rotator cuff repair   . Knee surgery     BILATERAL    Outpatient Encounter Prescriptions as of 09/26/2010  Medication Sig Dispense Refill  . albuterol (PROAIR HFA) 108 (90 BASE) MCG/ACT inhaler Inhale 2 puffs into the lungs 2 (two) times daily.        Marland Kitchen amLODipine (NORVASC) 10 MG tablet Take 10 mg by mouth daily.        Marland Kitchen aspirin 81 MG EC tablet Take 81 mg by mouth daily.        Marland Kitchen  beclomethasone (QVAR) 80 MCG/ACT inhaler Inhale 2 puffs into the lungs 2 (two) times daily.        . benazepril (LOTENSIN) 40 MG tablet 1 tab po qd  90 tablet  3  . carvedilol (COREG) 25 MG tablet Take 25 mg by mouth 2 (two) times daily with a meal.        . Cholecalciferol 1000 UNITS tablet Take 2,000 Units by mouth daily.        . furosemide (LASIX) 20 MG tablet Take 20 mg by mouth daily.        . hydrALAZINE (APRESOLINE) 25 MG tablet Take 25 mg by mouth 3 (three) times daily.        . indomethacin (INDOCIN) 25 MG capsule Take 25 mg by mouth 3 (three) times daily as needed. For  gout attacks       . metFORMIN (GLUCOPHAGE) 500 MG tablet Take 500 mg by mouth 2 (two) times daily.        Marland Kitchen omeprazole (PRILOSEC) 20 MG capsule Take 20 mg by mouth daily.        . pravastatin (PRAVACHOL) 80 MG tablet Take 80 mg by mouth at bedtime.        . promethazine-codeine (PHENERGAN WITH CODEINE) 6.25-10 MG/5ML syrup Take 5-10 mLs by mouth 4 (four) times daily as needed. For cough       . ranitidine (ZANTAC) 300 MG capsule Take 300 mg by mouth every evening.          No Known Allergies   Review of Systems       See HPI - all other systems neg except as noted...  The patient denies anorexia, fever, weight loss, weight gain, vision loss, decreased hearing, hoarseness, chest pain, syncope, dyspnea on exertion, peripheral edema, prolonged cough, headaches, hemoptysis, abdominal pain, melena, hematochezia, severe indigestion/heartburn, hematuria, incontinence, muscle weakness, suspicious skin lesions, transient blindness, difficulty walking, depression, unusual weight change, abnormal bleeding, enlarged lymph nodes, and angioedema.    Objective:   Physical Exam     WD, WN, 70 y/o BM in NAD... GENERAL:  Alert & oriented; pleasant & cooperative... HEENT:  [place]/AT, EOM-wnl, PERRLA, EACs-clear, TMs-wnl, NOSE-clear, THROAT-clear & wnl. NECK:  Supple w/ fairROM; no JVD; normal carotid impulses w/o bruits; no thyromegaly or nodules palpated; no lymphadenopathy. CHEST:  Clear to P & A; without wheezes/ rales/ or rhonchi heard... HEART:  Regular Rhythm; without murmurs/ rubs/ or gallops detected... ABDOMEN:  Soft & nontender; normal bowel sounds; no organomegaly or masses palpated... EXT: without deformities or arthritic changes; no varicose veins/ venous insuffic/ or edema. NEURO:  CN's intact; motor testing normal; sensory testing normal; gait normal & balance OK. DERM:  No lesions noted; no rash etc...   Assessment & Plan:   Hx Mild COPD>  He remains stable on QVAR & Proair;  He hasn't  smoked since 1997;  PFTs show mild restriction & sm airways disease w/ FEV1 stable over the last 104yrs;  Continue same & encouraged to increase exercise program...  HBP>  Stable on his 5 med regimen followed by DrCooper & DrPlotnikov...  CAD>  Stable w/o angina & followed by DrCooper for Cards...  CHOL>  On Prav80 & FLP followed by DrPlotnikov...  DM>  On Metformin Bid & followed by DrPlot...  GERD>  Stable on OMEP + ZANTAC & antireflux regimen...  Other medical problems as noted.Marland KitchenMarland Kitchen

## 2010-10-10 ENCOUNTER — Other Ambulatory Visit: Payer: Self-pay | Admitting: Internal Medicine

## 2010-10-11 ENCOUNTER — Encounter: Payer: Self-pay | Admitting: Pulmonary Disease

## 2010-10-24 ENCOUNTER — Telehealth: Payer: Self-pay | Admitting: Pulmonary Disease

## 2010-10-24 NOTE — Telephone Encounter (Signed)
I looked through refills in triage and did not see anything for this pt. Leigh please advise if you have seen this form. If  Not we will need to have pharmacy refax form. [place] Bing, CMA

## 2010-10-24 NOTE — Telephone Encounter (Signed)
Form has been signed by SN and i have faxed this back to [facility]. thanks

## 2010-10-26 ENCOUNTER — Other Ambulatory Visit: Payer: Self-pay | Admitting: Internal Medicine

## 2010-11-10 ENCOUNTER — Telehealth: Payer: Self-pay | Admitting: Pulmonary Disease

## 2010-11-10 NOTE — Telephone Encounter (Signed)
lmomtcb x1 for [employer]

## 2010-11-13 ENCOUNTER — Encounter: Payer: Self-pay | Admitting: Internal Medicine

## 2010-11-13 ENCOUNTER — Other Ambulatory Visit (INDEPENDENT_AMBULATORY_CARE_PROVIDER_SITE_OTHER): Payer: Medicare Other

## 2010-11-13 ENCOUNTER — Ambulatory Visit (INDEPENDENT_AMBULATORY_CARE_PROVIDER_SITE_OTHER): Payer: Medicare Other | Admitting: Internal Medicine

## 2010-11-13 DIAGNOSIS — I10 Essential (primary) hypertension: Secondary | ICD-10-CM

## 2010-11-13 DIAGNOSIS — M109 Gout, unspecified: Secondary | ICD-10-CM

## 2010-11-13 DIAGNOSIS — E785 Hyperlipidemia, unspecified: Secondary | ICD-10-CM

## 2010-11-13 DIAGNOSIS — E119 Type 2 diabetes mellitus without complications: Secondary | ICD-10-CM

## 2010-11-13 DIAGNOSIS — K219 Gastro-esophageal reflux disease without esophagitis: Secondary | ICD-10-CM

## 2010-11-13 DIAGNOSIS — I25810 Atherosclerosis of coronary artery bypass graft(s) without angina pectoris: Secondary | ICD-10-CM

## 2010-11-13 LAB — BASIC METABOLIC PANEL
BUN: 11 mg/dL (ref 6–23)
CO2: 26 mEq/L (ref 19–32)
Calcium: 8.7 mg/dL (ref 8.4–10.5)
GFR: 116.32 mL/min (ref >60.00)
Glucose, Bld: 171 mg/dL — ABNORMAL HIGH (ref 70–99)
Potassium: 3.7 mEq/L (ref 3.5–5.1)
Sodium: 141 mEq/L (ref 135–145)

## 2010-11-13 NOTE — Assessment & Plan Note (Signed)
Elev BP today. Check at home BP Readings from Last 3 Encounters:  11/13/10 150/98  09/26/10 116/76  07/17/10 148/90

## 2010-11-13 NOTE — Progress Notes (Signed)
  Subjective:    Patient ID: Harold Pittman, male    DOB: [birth year], 70 y.o.   MRN: [id number]  HPI   The patient presents for a follow-up of  chronic hypertension, CAD, chronic dyslipidemia, type 2 diabetes controlled with medicines   Review of Systems  Constitutional: Negative for appetite change, fatigue and unexpected weight change.  HENT: Negative for nosebleeds, congestion, sore throat, sneezing, trouble swallowing and neck pain.   Eyes: Negative for itching and visual disturbance.  Respiratory: Negative for cough.   Cardiovascular: Negative for chest pain, palpitations and leg swelling.  Gastrointestinal: Negative for nausea, diarrhea, blood in stool and abdominal distention.  Genitourinary: Negative for frequency and hematuria.  Musculoskeletal: Negative for back pain, joint swelling and gait problem.  Skin: Negative for rash.  Neurological: Negative for dizziness, tremors, speech difficulty and weakness.  Psychiatric/Behavioral: Negative for sleep disturbance, dysphoric mood and agitation. The patient is not nervous/anxious.        Objective:   Physical Exam  Constitutional: He is oriented to person, place, and time. He appears well-developed.  HENT:  Mouth/Throat: Oropharynx is clear and moist.  Eyes: Conjunctivae are normal. Pupils are equal, round, and reactive to light.  Neck: Normal range of motion. No JVD present. No thyromegaly present.  Cardiovascular: Normal rate, regular rhythm, normal heart sounds and intact distal pulses.  Exam reveals no gallop and no friction rub.   No murmur heard. Pulmonary/Chest: Effort normal and breath sounds normal. No respiratory distress. He has no wheezes. He has no rales. He exhibits no tenderness.  Abdominal: Soft. Bowel sounds are normal. He exhibits no distension and no mass. There is no tenderness. There is no rebound and no guarding.  Musculoskeletal: Normal range of motion. He exhibits no edema and no tenderness.    Lymphadenopathy:    He has no cervical adenopathy.  Neurological: He is alert and oriented to person, place, and time. He has normal reflexes. No cranial nerve deficit. He exhibits normal muscle tone. Coordination normal.  Skin: Skin is warm and dry. No rash noted.       1 cm seb cyst in R axilla  Psychiatric: He has a normal mood and affect. His behavior is normal. Judgment and thought content normal.          Assessment & Plan:

## 2010-11-13 NOTE — Assessment & Plan Note (Signed)
On Rx 

## 2010-11-13 NOTE — Assessment & Plan Note (Signed)
Doing well lately 

## 2010-11-13 NOTE — Telephone Encounter (Signed)
ALISHA RETURNED CALL.  PLEASE CALL BACK AT [phone]

## 2010-11-13 NOTE — Telephone Encounter (Signed)
lmomtcb for [employer]

## 2010-11-14 NOTE — Telephone Encounter (Signed)
[name] returning call can be reached at [phone].Harold Pittman

## 2010-11-14 NOTE — Telephone Encounter (Signed)
Spoke to Harold [facility]man at [facility] she states pt is having trouble with his breathing and they wanted to order a ONO, i told Harold Pittman if the patient was having problems we would see them here in the office and order any necc. Test that was needed i also told her i was going to call the pt and speak with him about any problems he might be having. I called Harold Pittman and ask if he was having any problems with his breathing and he said no that he felt harassed by [facility] and told them to contact us if they wanted him to do any test. Pt is fine and i have told Harold Pittman at [facility] to NOT contact our pt's anymore for things like this if our pt's needed something they would call us--Harold Pittman verbalized understanding and Harold Pittman thanked me for telling them to quit calling him.

## 2010-12-05 ENCOUNTER — Other Ambulatory Visit: Payer: Self-pay | Admitting: *Deleted

## 2010-12-05 MED ORDER — RANITIDINE HCL 300 MG PO CAPS: 300.0000 mg | ORAL_CAPSULE | Freq: Every evening | ORAL | Status: DC

## 2010-12-21 ENCOUNTER — Ambulatory Visit (INDEPENDENT_AMBULATORY_CARE_PROVIDER_SITE_OTHER): Payer: Medicare Other | Admitting: Internal Medicine

## 2010-12-21 ENCOUNTER — Encounter: Payer: Self-pay | Admitting: Internal Medicine

## 2010-12-21 DIAGNOSIS — IMO0002 Reserved for concepts with insufficient information to code with codable children: Secondary | ICD-10-CM

## 2010-12-21 DIAGNOSIS — L02411 Cutaneous abscess of right axilla: Secondary | ICD-10-CM

## 2010-12-21 MED ORDER — AMOXICILLIN-POT CLAVULANATE 875-125 MG PO TABS: 1.0000 | ORAL_TABLET | Freq: Two times a day (BID) | ORAL | Status: DC

## 2010-12-21 MED ORDER — MUPIROCIN 2 % EX OINT: TOPICAL_OINTMENT | CUTANEOUS | Status: AC

## 2010-12-21 MED ORDER — AMOXICILLIN-POT CLAVULANATE 875-125 MG PO TABS: 1.0000 | ORAL_TABLET | Freq: Two times a day (BID) | ORAL | Status: AC

## 2010-12-21 NOTE — Patient Instructions (Signed)

## 2010-12-24 ENCOUNTER — Encounter: Payer: Self-pay | Admitting: Internal Medicine

## 2010-12-24 DIAGNOSIS — L02411 Cutaneous abscess of right axilla: Secondary | ICD-10-CM | POA: Insufficient documentation

## 2010-12-24 NOTE — Assessment & Plan Note (Signed)
Procedure note:  Incision and Drainage of an Abscess   Indication : R axilla localized collection of pus that is tender and not spontaneously resolving.    Risks including unsuccessful procedure , possible need for a repeat procedure due to pus accumulation, scar formation, and others as well as benefits were explained to the patient in detail. Written consent was obtained/signed.    The patient was placed in a decubitus position. The area of an abscess was prepped with povidone-iodine and draped in a sterile fashion. Local anesthesia with   1    cc of 2% lidocaine and epinephrine  was administered.  1 cm incision with #11strait blade was made. About 2 cc of purulent material was expressed. The abscess cavity was explored with a sterile hemostat and the walled- off pockets and septae were broken down bluntly. The cavity was irrigated with the rest of the anesthetic in the syringe and packed with 2 inches of  the iodoform gauze.   The wound was dressed with antibiotic ointment and Telfa pad.  Tolerated well. Complications: None.   Wound instructions provided.   Wound instructions : change dressing once a day or twice a day is needed. Change dressing after  shower in the morning.  Pat dry the wound with gauze. Pull out one inch of packing everyday and cut it off. Re-dress wound with antibiotic ointment and Telfa pad or a Band-Aid of appropriate size.   Please contact us if you notice a recollection of pus in the abscess fever and chills increased pain redness red streaks near the abscess increased swelling in the area.

## 2010-12-24 NOTE — Progress Notes (Signed)
  Subjective:    Patient ID: Harold Pittman, male    DOB: 1941/01/31, 70 y.o.   MRN: [id number]  HPI  C/o a boil in R underarm x 2-3 d  Review of Systems  Constitutional: Positive for fever. Negative for fatigue.  Respiratory: Negative for cough.   Skin: Positive for wound (boil in R underarm ).  Hematological: Negative for adenopathy.       Objective:   Physical Exam  Constitutional: He is oriented to person, place, and time. He appears well-developed.  HENT:  Mouth/Throat: Oropharynx is clear and moist.  Eyes: Conjunctivae are normal. Pupils are equal, round, and reactive to light.  Neck: Normal range of motion. No JVD present. No thyromegaly present.  Cardiovascular: Normal rate, regular rhythm, normal heart sounds and intact distal pulses.  Exam reveals no gallop and no friction rub.   No murmur heard. Pulmonary/Chest: Effort normal and breath sounds normal. No respiratory distress. He exhibits no tenderness.  Abdominal: Soft. Bowel sounds are normal. He exhibits no distension.  Musculoskeletal: Normal range of motion. He exhibits no edema and no tenderness.  Lymphadenopathy:    He has no cervical adenopathy.  Neurological: He is alert and oriented to person, place, and time. He has normal reflexes. No cranial nerve deficit. He exhibits normal muscle tone. Coordination normal.  Skin: Skin is warm and dry.       1x2 cm boil in R axilla          Assessment & Plan:

## 2011-01-09 LAB — CULTURE, ROUTINE-ABSCESS

## 2011-01-16 ENCOUNTER — Other Ambulatory Visit: Payer: Self-pay | Admitting: Internal Medicine

## 2011-03-15 ENCOUNTER — Other Ambulatory Visit (INDEPENDENT_AMBULATORY_CARE_PROVIDER_SITE_OTHER): Payer: Medicare Other

## 2011-03-15 ENCOUNTER — Ambulatory Visit (INDEPENDENT_AMBULATORY_CARE_PROVIDER_SITE_OTHER): Payer: Medicare Other | Admitting: Internal Medicine

## 2011-03-15 ENCOUNTER — Encounter: Payer: Self-pay | Admitting: Internal Medicine

## 2011-03-15 ENCOUNTER — Telehealth: Payer: Self-pay | Admitting: Internal Medicine

## 2011-03-15 VITALS — BP 164/90 | HR 80 | Temp 98.6°F | Resp 16 | Wt 209.0 lb

## 2011-03-15 DIAGNOSIS — I2510 Atherosclerotic heart disease of native coronary artery without angina pectoris: Secondary | ICD-10-CM

## 2011-03-15 DIAGNOSIS — E119 Type 2 diabetes mellitus without complications: Secondary | ICD-10-CM

## 2011-03-15 DIAGNOSIS — M109 Gout, unspecified: Secondary | ICD-10-CM

## 2011-03-15 DIAGNOSIS — H6120 Impacted cerumen, unspecified ear: Secondary | ICD-10-CM

## 2011-03-15 DIAGNOSIS — E785 Hyperlipidemia, unspecified: Secondary | ICD-10-CM

## 2011-03-15 DIAGNOSIS — Z23 Encounter for immunization: Secondary | ICD-10-CM

## 2011-03-15 LAB — HEPATIC FUNCTION PANEL
AST: 20 U/L (ref 0–37)
Albumin: 4 g/dL (ref 3.5–5.2)
Alkaline Phosphatase: 67 U/L (ref 39–117)
Total Bilirubin: 0.5 mg/dL (ref 0.3–1.2)

## 2011-03-15 LAB — HEMOGLOBIN A1C: Hgb A1c MFr Bld: 7.2 % — ABNORMAL HIGH (ref 4.6–6.5)

## 2011-03-15 LAB — BASIC METABOLIC PANEL
CO2: 29 mEq/L (ref 19–32)
Calcium: 9.2 mg/dL (ref 8.4–10.5)
Creatinine, Ser: 1 mg/dL (ref 0.4–1.5)

## 2011-03-15 LAB — LIPID PANEL
Total CHOL/HDL Ratio: 4
Triglycerides: 106 mg/dL (ref 0.0–149.0)

## 2011-03-15 NOTE — Assessment & Plan Note (Signed)
Will irrigate 

## 2011-03-15 NOTE — Assessment & Plan Note (Signed)
Continue with current prescription therapy as reflected on the Med list.  

## 2011-03-15 NOTE — Progress Notes (Signed)
  Subjective:    Patient ID: Harold Pittman, male    DOB: 23-Mar-1941, 70 y.o.   MRN: [id number]  HPI  The patient presents for a follow-up of  chronic hypertension, chronic dyslipidemia, type 2 diabetes controlled with medicines  SBP 100-107 at home    Review of Systems  Constitutional: Negative for appetite change, fatigue and unexpected weight change.  HENT: Negative for nosebleeds, congestion, sore throat, sneezing, trouble swallowing and neck pain.   Eyes: Negative for itching and visual disturbance.  Respiratory: Negative for cough.   Cardiovascular: Negative for chest pain, palpitations and leg swelling.  Gastrointestinal: Negative for nausea, diarrhea, blood in stool and abdominal distention.  Genitourinary: Negative for frequency and hematuria.  Musculoskeletal: Negative for back pain, joint swelling and gait problem.  Skin: Negative for rash.  Neurological: Negative for dizziness, tremors, speech difficulty and weakness.  Psychiatric/Behavioral: Negative for sleep disturbance, dysphoric mood and agitation. The patient is not nervous/anxious.    BP Readings from Last 3 Encounters:  03/15/11 164/90  12/21/10 140/86  11/13/10 150/98       Objective:   Physical Exam  Constitutional: He is oriented to person, place, and time. He appears well-developed.  HENT:       B wax  Eyes: Conjunctivae are normal. Pupils are equal, round, and reactive to light.  Neck: Normal range of motion. No JVD present. No thyromegaly present.  Cardiovascular: Normal rate, regular rhythm, normal heart sounds and intact distal pulses.  Exam reveals no gallop and no friction rub.   No murmur heard. Pulmonary/Chest: Effort normal and breath sounds normal. No respiratory distress. He has no wheezes. He has no rales. He exhibits no tenderness.  Abdominal: Soft. Bowel sounds are normal. He exhibits no distension and no mass. There is no tenderness. There is no rebound and no guarding.  Musculoskeletal:  Normal range of motion. He exhibits no edema and no tenderness.  Lymphadenopathy:    He has no cervical adenopathy.  Neurological: He is alert and oriented to person, place, and time. He has normal reflexes. No cranial nerve deficit. He exhibits normal muscle tone. Coordination normal.  Skin: Skin is warm and dry. No rash noted.  Psychiatric: He has a normal mood and affect. His behavior is normal. Judgment and thought content normal.    Procedure Note :     Procedure :  Ear irrigation   Indication:  Cerumen impaction B   Risks, including pain, dizziness, eardrum perforation, bleeding, infection and others as well as benefits were explained to the patient in detail. Verbal consent was obtained and the patient agreed to proceed.    We used "[employer]" field with lukewarm water for irrigation. A large amount wax was recovered. Procedure has also required manual wax removal with an ear loop.   Tolerated well. Complications: None.   Postprocedure instructions :  Call if problems.         Assessment & Plan:

## 2011-03-15 NOTE — Assessment & Plan Note (Signed)
A1c is ok 8/12 Continue with current prescription therapy as reflected on the Med list.

## 2011-03-15 NOTE — Patient Instructions (Signed)
BP Readings from Last 3 Encounters:  03/15/11 164/90  12/21/10 140/86  11/13/10 150/98

## 2011-03-15 NOTE — Telephone Encounter (Signed)
Harold Pittman, please, inform patient that all labs are normal except for elev glu: cut back on sugars as we discussed Thx

## 2011-03-16 NOTE — Telephone Encounter (Signed)
Left detailed mess informing pt of below.  

## 2011-04-16 ENCOUNTER — Ambulatory Visit (INDEPENDENT_AMBULATORY_CARE_PROVIDER_SITE_OTHER): Payer: Self-pay | Admitting: Cardiovascular Disease

## 2011-04-16 ENCOUNTER — Encounter: Payer: Self-pay | Admitting: Cardiovascular Disease

## 2011-04-16 DIAGNOSIS — I2510 Atherosclerotic heart disease of native coronary artery without angina pectoris: Secondary | ICD-10-CM

## 2011-04-16 DIAGNOSIS — E785 Hyperlipidemia, unspecified: Secondary | ICD-10-CM

## 2011-04-16 DIAGNOSIS — I10 Essential (primary) hypertension: Secondary | ICD-10-CM

## 2011-04-16 MED ORDER — HYDRALAZINE HCL 50 MG PO TABS: 50.0000 mg | ORAL_TABLET | Freq: Three times a day (TID) | ORAL | Status: DC

## 2011-04-16 NOTE — Progress Notes (Signed)
HPI:  This is a 71 year old gentleman presented for follow up of coronary artery disease. The patient had coronary bypass surgery in 1997.  He is also followed for hypertension and hyperlipidemia. His last lipid panel was March 15, 2011 and it showed a cholesterol of 129, triglycerides 106, HDL 35, and LDL 73. The patient's hemoglobin A1c was 7.2.  The patient continues with regular exercise and he has no exertional symptoms. He specifically denies chest pain or pressure. He denies dyspnea, edema, orthopnea, PND, palpitations, lightheadedness, or syncope.  Outpatient Encounter Prescriptions as of 04/16/2011  Medication Sig Dispense Refill  . albuterol (PROAIR HFA) 108 (90 BASE) MCG/ACT inhaler Inhale 2 puffs into the lungs 2 (two) times daily.  1 Inhaler  11  . amLODipine (NORVASC) 10 MG tablet TAKE ONE TABLET BY MOUTH EVERY DAY  30 tablet  5  . aspirin 81 MG EC tablet Take 81 mg by mouth daily.        . beclomethasone (QVAR) 80 MCG/ACT inhaler Inhale 2 puffs into the lungs 2 (two) times daily.  1 Inhaler  11  . benazepril (LOTENSIN) 40 MG tablet 1 tab po qd  90 tablet  3  . carvedilol (COREG) 25 MG tablet TAKE ONE TABLET BY MOUTH TWICE DAILY  60 tablet  3  . Cholecalciferol 1000 UNITS tablet Take 2,000 Units by mouth daily.        . furosemide (LASIX) 20 MG tablet TAKE ONE TABLET BY MOUTH IN THE MORNING  90 tablet  1  . hydrALAZINE (APRESOLINE) 25 MG tablet Take 25 mg by mouth 3 (three) times daily.        . indomethacin (INDOCIN) 25 MG capsule Take 25 mg by mouth 3 (three) times daily as needed. For gout attacks       . metFORMIN (GLUCOPHAGE) 500 MG tablet TAKE ONE TABLET BY MOUTH TWICE DAILY  60 tablet  3  . omeprazole (PRILOSEC) 20 MG capsule TAKE ONE CAPSULE BY MOUTH EVERY DAY **TAKEN  30  MINUTES  BEFORE  FIRST  MEAL  OF  THE  DAY**  30 capsule  6  . pravastatin (PRAVACHOL) 80 MG tablet Take 80 mg by mouth at bedtime.        . promethazine-codeine (PHENERGAN WITH CODEINE) 6.25-10 MG/5ML  syrup Take 5-10 mLs by mouth 4 (four) times daily as needed. For cough       . ranitidine (ZANTAC) 300 MG capsule Take 1 capsule (300 mg total) by mouth every evening.  30 capsule  5  . tadalafil (CIALIS) 20 MG tablet Take as directed  10 tablet  5    No Known Allergies  Past Medical History  Diagnosis Date  . Chronic airway obstruction, not elsewhere classified   . Personal history of tobacco use, presenting hazards to health   . Unspecified essential hypertension   . Coronary atherosclerosis of artery bypass graft   . Other and unspecified hyperlipidemia   . Type II or unspecified type diabetes mellitus without mention of complication, not stated as uncontrolled   . Esophageal reflux   . Blood in stool   . Other specified disorder of rectum and anus   . ED (erectile dysfunction)   . Osteoarthrosis, unspecified whether generalized or localized, unspecified site   . Gout, unspecified   . Anxiety state, unspecified     ROS: Negative except as per HPI  BP 160/74  Pulse 69  Ht 6\' 2"  (1.88 m)  Wt 93.042 kg (205 lb 1.9 oz)  BMI 26.34 kg/m2  PHYSICAL EXAM: Pt is alert and oriented, NAD HEENT: normal Neck: JVP - normal, carotids 2+= without bruits Lungs: CTA bilaterally CV: RRR without murmur or gallop Abd: soft, NT, Positive BS, no hepatomegaly Ext: no C/C/E, distal pulses intact and equal Skin: warm/dry no rash  EKG:  Normal sinus rhythm 69 beats per minute, nonspecific T wave abnormality.  ASSESSMENT AND PLAN:

## 2011-04-16 NOTE — Patient Instructions (Signed)
Your physician has recommended you make the following change in your medication: INCREASE Hydralazine to 50mg  take one by mouth three times a day  Your physician has requested that you have a renal artery duplex. During this test, an ultrasound is used to evaluate blood flow to the kidneys. Allow one hour for this exam. Do not eat after midnight the day before and avoid carbonated beverages. Take your medications as you usually do.  Your physician wants you to follow-up in: 1 YEAR.  You will receive a reminder letter in the mail two months in advance. If you don't receive a letter, please call our office to schedule the follow-up appointment.

## 2011-04-22 NOTE — Assessment & Plan Note (Signed)
The patient is stable without angina. He exercises regularly and is following a good medical regimen. I recommended that he continue his same cardiac medications with the exception of increasing his antihypertensive regimen. He will followup in 12 months.

## 2011-04-22 NOTE — Assessment & Plan Note (Signed)
The patient's LDL is at goal on pravastatin 80 mg daily. His HDL remains low. There is no evidence that additional pharmacotherapy for HDL raising and statin patient's provides additional risk reduction. Will continue the same program.

## 2011-04-22 NOTE — Assessment & Plan Note (Signed)
He will increase hydralazine to 50 mg 3 times daily. I recommended a renal arterial duplex to rule out renal artery stenosis as he is on multidrug therapy.

## 2011-04-23 ENCOUNTER — Other Ambulatory Visit: Payer: Self-pay | Admitting: Internal Medicine

## 2011-05-02 ENCOUNTER — Encounter (INDEPENDENT_AMBULATORY_CARE_PROVIDER_SITE_OTHER): Payer: Medicare Other | Admitting: Cardiology

## 2011-05-02 DIAGNOSIS — I10 Essential (primary) hypertension: Secondary | ICD-10-CM

## 2011-05-02 DIAGNOSIS — I700 Atherosclerosis of aorta: Secondary | ICD-10-CM

## 2011-05-02 DIAGNOSIS — I2510 Atherosclerotic heart disease of native coronary artery without angina pectoris: Secondary | ICD-10-CM

## 2011-05-09 ENCOUNTER — Other Ambulatory Visit: Payer: Self-pay | Admitting: Pulmonary Disease

## 2011-05-09 ENCOUNTER — Other Ambulatory Visit: Payer: Self-pay | Admitting: Internal Medicine

## 2011-05-09 ENCOUNTER — Other Ambulatory Visit: Payer: Self-pay | Admitting: Cardiovascular Disease

## 2011-05-09 ENCOUNTER — Other Ambulatory Visit: Payer: Self-pay | Admitting: *Deleted

## 2011-05-09 MED ORDER — AMLODIPINE BESYLATE 10 MG PO TABS: 10.0000 mg | ORAL_TABLET | Freq: Every day | ORAL | Status: DC

## 2011-05-25 ENCOUNTER — Other Ambulatory Visit: Payer: Self-pay | Admitting: Internal Medicine

## 2011-06-14 ENCOUNTER — Ambulatory Visit: Payer: Medicare Other | Admitting: Internal Medicine

## 2011-06-21 ENCOUNTER — Encounter: Payer: Self-pay | Admitting: Internal Medicine

## 2011-06-29 ENCOUNTER — Ambulatory Visit (INDEPENDENT_AMBULATORY_CARE_PROVIDER_SITE_OTHER): Payer: Medicare Other | Admitting: Internal Medicine

## 2011-06-29 ENCOUNTER — Other Ambulatory Visit (INDEPENDENT_AMBULATORY_CARE_PROVIDER_SITE_OTHER): Payer: Medicare Other

## 2011-06-29 ENCOUNTER — Encounter: Payer: Self-pay | Admitting: Internal Medicine

## 2011-06-29 VITALS — BP 150/80 | HR 92 | Resp 16 | Wt 204.0 lb

## 2011-06-29 DIAGNOSIS — I2510 Atherosclerotic heart disease of native coronary artery without angina pectoris: Secondary | ICD-10-CM

## 2011-06-29 DIAGNOSIS — I10 Essential (primary) hypertension: Secondary | ICD-10-CM

## 2011-06-29 DIAGNOSIS — I25810 Atherosclerosis of coronary artery bypass graft(s) without angina pectoris: Secondary | ICD-10-CM

## 2011-06-29 DIAGNOSIS — J449 Chronic obstructive pulmonary disease, unspecified: Secondary | ICD-10-CM

## 2011-06-29 DIAGNOSIS — E119 Type 2 diabetes mellitus without complications: Secondary | ICD-10-CM

## 2011-06-29 DIAGNOSIS — F411 Generalized anxiety disorder: Secondary | ICD-10-CM

## 2011-06-29 DIAGNOSIS — J4489 Other specified chronic obstructive pulmonary disease: Secondary | ICD-10-CM

## 2011-06-29 DIAGNOSIS — R0989 Other specified symptoms and signs involving the circulatory and respiratory systems: Secondary | ICD-10-CM

## 2011-06-29 DIAGNOSIS — R0609 Other forms of dyspnea: Secondary | ICD-10-CM

## 2011-06-29 LAB — BASIC METABOLIC PANEL
BUN: 11 mg/dL (ref 6–23)
CO2: 28 mEq/L (ref 19–32)
Chloride: 109 mEq/L (ref 96–112)
Creatinine, Ser: 1.1 mg/dL (ref 0.4–1.5)

## 2011-06-29 LAB — CBC WITH DIFFERENTIAL/PLATELET
Basophils Relative: 0.4 % (ref 0.0–3.0)
Hemoglobin: 11.8 g/dL — ABNORMAL LOW (ref 13.0–17.0)
Lymphocytes Relative: 27.3 % (ref 12.0–46.0)
MCHC: 32.3 g/dL (ref 30.0–36.0)
Monocytes Relative: 13.8 % — ABNORMAL HIGH (ref 3.0–12.0)
Neutro Abs: 3 10*3/uL (ref 1.4–7.7)
RBC: 4.64 Mil/uL (ref 4.22–5.81)

## 2011-06-29 LAB — HEMOGLOBIN A1C: Hgb A1c MFr Bld: 6.8 % — ABNORMAL HIGH (ref 4.6–6.5)

## 2011-06-29 NOTE — Progress Notes (Signed)
Patient ID: Harold Pittman, male   DOB: 1940/07/22, 71 y.o.   MRN: [id number]  Subjective:    Patient ID: Harold Pittman, male    DOB: May 14, 1940, 71 y.o.   MRN: [id number]  HPI  The patient presents for a follow-up of  chronic hypertension, chronic dyslipidemia, type 2 diabetes controlled with medicines C/o SOB w/exertion x months; card check up was good w/Dr Burt Knack SBP 100-107 at home    Review of Systems  Constitutional: Negative for appetite change, fatigue and unexpected weight change.  HENT: Negative for nosebleeds, congestion, sore throat, sneezing, trouble swallowing and neck pain.   Eyes: Negative for itching and visual disturbance.  Respiratory: Negative for cough.   Cardiovascular: Negative for chest pain, palpitations and leg swelling.  Gastrointestinal: Negative for nausea, diarrhea, blood in stool and abdominal distention.  Genitourinary: Negative for frequency and hematuria.  Musculoskeletal: Negative for back pain, joint swelling and gait problem.  Skin: Negative for rash.  Neurological: Negative for dizziness, tremors, speech difficulty and weakness.  Psychiatric/Behavioral: Negative for sleep disturbance, dysphoric mood and agitation. The patient is not nervous/anxious.    BP Readings from Last 3 Encounters:  06/29/11 170/90  04/16/11 160/74  03/15/11 164/90   Wt Readings from Last 3 Encounters:  06/29/11 204 lb (92.534 kg)  04/16/11 205 lb 1.9 oz (93.042 kg)  03/15/11 209 lb (94.802 kg)        Objective:   Physical Exam  Constitutional: He is oriented to person, place, and time. He appears well-developed.  Eyes: Conjunctivae are normal. Pupils are equal, round, and reactive to light.  Neck: Normal range of motion. No JVD present. No thyromegaly present.  Cardiovascular: Normal rate, regular rhythm, normal heart sounds and intact distal pulses.  Exam reveals no gallop and no friction rub.   No murmur heard. Pulmonary/Chest: Effort normal and breath sounds  normal. No respiratory distress. He has no wheezes. He has no rales. He exhibits no tenderness.  Abdominal: Soft. Bowel sounds are normal. He exhibits no distension and no mass. There is no tenderness. There is no rebound and no guarding.  Musculoskeletal: Normal range of motion. He exhibits no edema and no tenderness.  Lymphadenopathy:    He has no cervical adenopathy.  Neurological: He is alert and oriented to person, place, and time. He has normal reflexes. No cranial nerve deficit. He exhibits normal muscle tone. Coordination normal.  Skin: Skin is warm and dry. No rash noted.  Psychiatric: He has a normal mood and affect. His behavior is normal. Judgment and thought content normal.    Lab Results  Component Value Date   WBC 5.0 03/22/2010   HGB 12.4* 03/22/2010   HCT 36.5* 03/22/2010   PLT 146.0* 03/22/2010   GLUCOSE 185* 03/15/2011   CHOL 129 03/15/2011   TRIG 106.0 03/15/2011   HDL 35.10* 03/15/2011   LDLCALC 73 03/15/2011   ALT 17 03/15/2011   AST 20 03/15/2011   NA 144 03/15/2011   K 4.2 03/15/2011   CL 108 03/15/2011   CREATININE 1.0 03/15/2011   BUN 15 03/15/2011   CO2 29 03/15/2011   TSH 0.51 03/22/2010   PSA 1.31 03/22/2010   HGBA1C 7.2 Repeated and verified X2.* 03/15/2011   MICROALBUR 57.7* 03/22/2010         Assessment & Plan:

## 2011-06-29 NOTE — Assessment & Plan Note (Signed)
Continue with current prescription therapy as reflected on the Med list.  

## 2011-06-29 NOTE — Assessment & Plan Note (Signed)
Start exercising

## 2011-06-29 NOTE — Assessment & Plan Note (Signed)
Chronic BP is nl at home Continue with current prescription therapy as reflected on the Med list.

## 2011-07-25 ENCOUNTER — Other Ambulatory Visit: Payer: Self-pay | Admitting: Internal Medicine

## 2011-10-08 ENCOUNTER — Telehealth: Payer: Self-pay | Admitting: Internal Medicine

## 2011-10-08 ENCOUNTER — Ambulatory Visit (INDEPENDENT_AMBULATORY_CARE_PROVIDER_SITE_OTHER): Payer: Medicare Other | Admitting: Internal Medicine

## 2011-10-08 ENCOUNTER — Other Ambulatory Visit (INDEPENDENT_AMBULATORY_CARE_PROVIDER_SITE_OTHER): Payer: Medicare Other

## 2011-10-08 ENCOUNTER — Encounter: Payer: Self-pay | Admitting: Internal Medicine

## 2011-10-08 VITALS — BP 150/90 | HR 84 | Temp 98.5°F | Resp 16 | Wt 200.0 lb

## 2011-10-08 DIAGNOSIS — I10 Essential (primary) hypertension: Secondary | ICD-10-CM

## 2011-10-08 DIAGNOSIS — E119 Type 2 diabetes mellitus without complications: Secondary | ICD-10-CM

## 2011-10-08 DIAGNOSIS — M109 Gout, unspecified: Secondary | ICD-10-CM

## 2011-10-08 DIAGNOSIS — I25810 Atherosclerosis of coronary artery bypass graft(s) without angina pectoris: Secondary | ICD-10-CM

## 2011-10-08 DIAGNOSIS — R609 Edema, unspecified: Secondary | ICD-10-CM

## 2011-10-08 DIAGNOSIS — R0609 Other forms of dyspnea: Secondary | ICD-10-CM

## 2011-10-08 DIAGNOSIS — R0989 Other specified symptoms and signs involving the circulatory and respiratory systems: Secondary | ICD-10-CM

## 2011-10-08 LAB — HEMOGLOBIN A1C: Hgb A1c MFr Bld: 6.8 % — ABNORMAL HIGH (ref 4.6–6.5)

## 2011-10-08 LAB — BASIC METABOLIC PANEL
BUN: 11 mg/dL (ref 6–23)
Chloride: 104 mEq/L (ref 96–112)
Glucose, Bld: 161 mg/dL — ABNORMAL HIGH (ref 70–99)
Potassium: 3.8 mEq/L (ref 3.5–5.1)

## 2011-10-08 LAB — TSH: TSH: 0.91 u[IU]/mL (ref 0.35–5.50)

## 2011-10-08 MED ORDER — SPIRONOLACTONE 25 MG PO TABS: 25.0000 mg | ORAL_TABLET | Freq: Every day | ORAL | Status: DC

## 2011-10-08 NOTE — Progress Notes (Signed)
  Subjective:    Patient ID: Harold Pittman, male    DOB: 1940-09-02, 71 y.o.   MRN: [id number]  HPI  The patient presents for a follow-up of  chronic hypertension, chronic dyslipidemia, type 2 diabetes controlled with medicines  SBP 100-107 at home C/o B feet swelling x 1 wk,     Review of Systems  Constitutional: Negative for appetite change, fatigue and unexpected weight change.  HENT: Negative for nosebleeds, congestion, sore throat, sneezing, trouble swallowing and neck pain.   Eyes: Negative for itching and visual disturbance.  Respiratory: Negative for cough.   Cardiovascular: Negative for chest pain, palpitations and leg swelling.  Gastrointestinal: Negative for nausea, diarrhea, blood in stool and abdominal distention.  Genitourinary: Negative for frequency and hematuria.  Musculoskeletal: Negative for back pain, joint swelling and gait problem.  Skin: Negative for rash.  Neurological: Negative for dizziness, tremors, speech difficulty and weakness.  Psychiatric/Behavioral: Negative for disturbed wake/sleep cycle, dysphoric mood and agitation. The patient is not nervous/anxious.    BP Readings from Last 3 Encounters:  10/08/11 150/90  06/29/11 150/80  04/16/11 160/74   Wt Readings from Last 3 Encounters:  10/08/11 200 lb (90.719 kg)  06/29/11 204 lb (92.534 kg)  04/16/11 205 lb 1.9 oz (93.042 kg)        Objective:   Physical Exam  Constitutional: He is oriented to person, place, and time. He appears well-developed.  Eyes: Conjunctivae are normal. Pupils are equal, round, and reactive to light.  Neck: Normal range of motion. No JVD present. No thyromegaly present.  Cardiovascular: Normal rate, regular rhythm, normal heart sounds and intact distal pulses.  Exam reveals no gallop and no friction rub.   No murmur heard. Pulmonary/Chest: Effort normal and breath sounds normal. No respiratory distress. He has no wheezes. He has no rales. He exhibits no tenderness.    Abdominal: Soft. Bowel sounds are normal. He exhibits no distension and no mass. There is no tenderness. There is no rebound and no guarding.  Musculoskeletal: Normal range of motion. He exhibits no edema and no tenderness.  Lymphadenopathy:    He has no cervical adenopathy.  Neurological: He is alert and oriented to person, place, and time. He has normal reflexes. No cranial nerve deficit. He exhibits normal muscle tone. Coordination normal.  Skin: Skin is warm and dry. No rash noted.  Psychiatric: He has a normal mood and affect. His behavior is normal. Judgment and thought content normal.  B lat ankles are with trace to 1+ edema, NT  Lab Results  Component Value Date   WBC 5.4 06/29/2011   HGB 11.8* 06/29/2011   HCT 36.5* 06/29/2011   PLT 155.0 06/29/2011   GLUCOSE 141* 06/29/2011   CHOL 129 03/15/2011   TRIG 106.0 03/15/2011   HDL 35.10* 03/15/2011   LDLCALC 73 03/15/2011   ALT 17 03/15/2011   AST 20 03/15/2011   NA 144 06/29/2011   K 3.7 06/29/2011   CL 109 06/29/2011   CREATININE 1.1 06/29/2011   BUN 11 06/29/2011   CO2 28 06/29/2011   TSH 0.86 06/29/2011   PSA 1.31 03/22/2010   HGBA1C 6.8* 06/29/2011   MICROALBUR 57.7* 03/22/2010         Assessment & Plan:

## 2011-10-08 NOTE — Telephone Encounter (Signed)
Pt informed

## 2011-10-08 NOTE — Assessment & Plan Note (Signed)
See meds 

## 2011-10-08 NOTE — Assessment & Plan Note (Signed)
Continue with current prescription therapy as reflected on the Med list.  

## 2011-10-08 NOTE — Assessment & Plan Note (Signed)
Continue with current prescription therapy prn as reflected on the Med list.

## 2011-10-08 NOTE — Assessment & Plan Note (Signed)
deconditioning; chronic x years

## 2011-10-08 NOTE — Assessment & Plan Note (Signed)
7/13 B feet, mild. Poss related to Norvasc Spironolactone prn w/caution Labs

## 2011-10-08 NOTE — Telephone Encounter (Signed)
Stacey, please, inform patient that all labs are ok Thx 

## 2011-10-23 ENCOUNTER — Other Ambulatory Visit: Payer: Self-pay | Admitting: Internal Medicine

## 2011-10-23 ENCOUNTER — Other Ambulatory Visit: Payer: Self-pay | Admitting: Pulmonary Disease

## 2011-12-11 ENCOUNTER — Other Ambulatory Visit: Payer: Self-pay | Admitting: Internal Medicine

## 2011-12-11 ENCOUNTER — Other Ambulatory Visit: Payer: Self-pay | Admitting: Pulmonary Disease

## 2012-01-28 ENCOUNTER — Other Ambulatory Visit: Payer: Self-pay | Admitting: Pulmonary Disease

## 2012-02-04 ENCOUNTER — Other Ambulatory Visit: Payer: Self-pay | Admitting: Pulmonary Disease

## 2012-02-08 ENCOUNTER — Encounter: Payer: Self-pay | Admitting: Internal Medicine

## 2012-02-08 ENCOUNTER — Ambulatory Visit (INDEPENDENT_AMBULATORY_CARE_PROVIDER_SITE_OTHER): Payer: Medicare Other | Admitting: Internal Medicine

## 2012-02-08 ENCOUNTER — Other Ambulatory Visit (INDEPENDENT_AMBULATORY_CARE_PROVIDER_SITE_OTHER): Payer: Medicare Other

## 2012-02-08 VITALS — BP 122/64 | HR 69 | Temp 97.2°F | Resp 16 | Wt 200.0 lb

## 2012-02-08 DIAGNOSIS — I2510 Atherosclerotic heart disease of native coronary artery without angina pectoris: Secondary | ICD-10-CM

## 2012-02-08 DIAGNOSIS — R0609 Other forms of dyspnea: Secondary | ICD-10-CM

## 2012-02-08 DIAGNOSIS — K219 Gastro-esophageal reflux disease without esophagitis: Secondary | ICD-10-CM

## 2012-02-08 DIAGNOSIS — E119 Type 2 diabetes mellitus without complications: Secondary | ICD-10-CM

## 2012-02-08 DIAGNOSIS — M109 Gout, unspecified: Secondary | ICD-10-CM

## 2012-02-08 DIAGNOSIS — Z23 Encounter for immunization: Secondary | ICD-10-CM

## 2012-02-08 DIAGNOSIS — R0989 Other specified symptoms and signs involving the circulatory and respiratory systems: Secondary | ICD-10-CM

## 2012-02-08 DIAGNOSIS — R609 Edema, unspecified: Secondary | ICD-10-CM

## 2012-02-08 DIAGNOSIS — Z79899 Other long term (current) drug therapy: Secondary | ICD-10-CM

## 2012-02-08 LAB — HEMOGLOBIN A1C: Hgb A1c MFr Bld: 6.5 % (ref 4.6–6.5)

## 2012-02-08 LAB — BASIC METABOLIC PANEL
CO2: 29 mEq/L (ref 19–32)
Chloride: 108 mEq/L (ref 96–112)
Potassium: 3.7 mEq/L (ref 3.5–5.1)
Sodium: 144 mEq/L (ref 135–145)

## 2012-02-08 NOTE — Assessment & Plan Note (Signed)
No change 

## 2012-02-08 NOTE — Assessment & Plan Note (Signed)
Continue with current prescription therapy as reflected on the Med list.  

## 2012-02-08 NOTE — Assessment & Plan Note (Signed)
Doing well Continue with current prescription therapy as reflected on the Med list.  

## 2012-02-08 NOTE — Assessment & Plan Note (Signed)
Resolved

## 2012-02-08 NOTE — Progress Notes (Signed)
Patient ID: Harold Pittman, male   DOB: [birth year], 71 y.o.   MRN: [id number]  Subjective:    Patient ID: Harold Pittman, male    DOB: 11/23/1940, 71 y.o.   MRN: [id number]  HPI  The patient presents for a follow-up of  chronic hypertension, chronic dyslipidemia, type 2 diabetes controlled with medicines  SBP 100-107 at home F/u B feet swelling - resolved    Review of Systems  Constitutional: Negative for appetite change, fatigue and unexpected weight change.  HENT: Negative for nosebleeds, congestion, sore throat, sneezing, trouble swallowing and neck pain.   Eyes: Negative for itching and visual disturbance.  Respiratory: Negative for cough.   Cardiovascular: Negative for chest pain, palpitations and leg swelling.  Gastrointestinal: Negative for nausea, diarrhea, blood in stool and abdominal distention.  Genitourinary: Negative for frequency and hematuria.  Musculoskeletal: Negative for back pain, joint swelling and gait problem.  Skin: Negative for rash.  Neurological: Negative for dizziness, tremors, speech difficulty and weakness.  Psychiatric/Behavioral: Negative for disturbed wake/sleep cycle, dysphoric mood and agitation. The patient is not nervous/anxious.    BP Readings from Last 3 Encounters:  02/08/12 122/64  10/08/11 150/90  06/29/11 150/80   Wt Readings from Last 3 Encounters:  02/08/12 200 lb (90.719 kg)  10/08/11 200 lb (90.719 kg)  06/29/11 204 lb (92.534 kg)        Objective:   Physical Exam  Constitutional: He is oriented to person, place, and time. He appears well-developed.  Eyes: Conjunctivae normal are normal. Pupils are equal, round, and reactive to light.  Neck: Normal range of motion. No JVD present. No thyromegaly present.  Cardiovascular: Normal rate, regular rhythm, normal heart sounds and intact distal pulses.  Exam reveals no gallop and no friction rub.   No murmur heard. Pulmonary/Chest: Effort normal and breath sounds normal. No respiratory  distress. He has no wheezes. He has no rales. He exhibits no tenderness.  Abdominal: Soft. Bowel sounds are normal. He exhibits no distension and no mass. There is no tenderness. There is no rebound and no guarding.  Musculoskeletal: Normal range of motion. He exhibits no edema and no tenderness.  Lymphadenopathy:    He has no cervical adenopathy.  Neurological: He is alert and oriented to person, place, and time. He has normal reflexes. No cranial nerve deficit. He exhibits normal muscle tone. Coordination normal.  Skin: Skin is warm and dry. No rash noted.  Psychiatric: He has a normal mood and affect. His behavior is normal. Judgment and thought content normal.  B lat ankles are with no edema  Lab Results  Component Value Date   WBC 5.4 06/29/2011   HGB 11.8* 06/29/2011   HCT 36.5* 06/29/2011   PLT 155.0 06/29/2011   GLUCOSE 161* 10/08/2011   CHOL 129 03/15/2011   TRIG 106.0 03/15/2011   HDL 35.10* 03/15/2011   LDLCALC 73 03/15/2011   ALT 17 03/15/2011   AST 20 03/15/2011   NA 142 10/08/2011   K 3.8 10/08/2011   CL 104 10/08/2011   CREATININE 0.9 10/08/2011   BUN 11 10/08/2011   CO2 28 10/08/2011   TSH 0.91 10/08/2011   PSA 1.31 03/22/2010   HGBA1C 6.8* 10/08/2011   MICROALBUR 57.7* 03/22/2010         Assessment & Plan:

## 2012-02-08 NOTE — Assessment & Plan Note (Signed)
Xx

## 2012-03-03 ENCOUNTER — Other Ambulatory Visit: Payer: Self-pay | Admitting: Pulmonary Disease

## 2012-03-13 ENCOUNTER — Other Ambulatory Visit: Payer: Self-pay | Admitting: Internal Medicine

## 2012-04-14 ENCOUNTER — Other Ambulatory Visit: Payer: Self-pay | Admitting: Internal Medicine

## 2012-04-14 ENCOUNTER — Other Ambulatory Visit: Payer: Self-pay | Admitting: Pulmonary Disease

## 2012-05-02 ENCOUNTER — Encounter: Payer: Self-pay | Admitting: Cardiovascular Disease

## 2012-05-02 ENCOUNTER — Ambulatory Visit (INDEPENDENT_AMBULATORY_CARE_PROVIDER_SITE_OTHER): Payer: Medicare Other | Admitting: Cardiovascular Disease

## 2012-05-02 VITALS — BP 143/82 | HR 65 | Ht 73.0 in | Wt 204.4 lb

## 2012-05-02 DIAGNOSIS — I10 Essential (primary) hypertension: Secondary | ICD-10-CM

## 2012-05-02 NOTE — Progress Notes (Signed)
HPI:  72 year old gentleman presenting for followup evaluation. The patient is followed for CAD status post CABG in 1997. He also has hyperlipidemia, hypertension, and chronic dyspnea. He has mild COPD. Last spirometry was done in 2012. When he was seen last year he was noted to be hypertensive on multiple medications. Renal arterial duplex was performed and showed no significant renal artery stenosis. Hydralazine was increased to 50 mg 3 times daily.  Overall the patient is doing well. He denies chest pain or pressure. He has chronic dyspnea with exertion which is essentially unchanged over time. He notes dyspnea with yard work and climbing stairs. He denies orthopnea or PND. He's had no leg swelling. His anginal equivalent in the past has been "indigestion" but he has not had this problem since his bypass surgery in 1997.  Outpatient Encounter Prescriptions as of 05/02/2012  Medication Sig Dispense Refill  . albuterol (PROAIR HFA) 108 (90 BASE) MCG/ACT inhaler Inhale 2 puffs into the lungs 2 (two) times daily.  1 Inhaler  11  . amLODipine (NORVASC) 10 MG tablet TAKE ONE TABLET BY MOUTH EVERY DAY  30 tablet  5  . aspirin 81 MG EC tablet Take 81 mg by mouth daily.        . beclomethasone (QVAR) 80 MCG/ACT inhaler Inhale 2 puffs into the lungs 2 (two) times daily.  1 Inhaler  11  . benazepril (LOTENSIN) 40 MG tablet 1 tab po qd  90 tablet  3  . carvedilol (COREG) 25 MG tablet TAKE ONE TABLET BY MOUTH TWICE DAILY  60 tablet  5  . Cholecalciferol 1000 UNITS tablet Take 2,000 Units by mouth daily.        . furosemide (LASIX) 20 MG tablet TAKE ONE TABLET BY MOUTH EVERY DAY IN THE MORNING  90 tablet  1  . hydrALAZINE (APRESOLINE) 50 MG tablet Take 1 tablet (50 mg total) by mouth 3 (three) times daily.  270 tablet  3  . indomethacin (INDOCIN) 25 MG capsule TAKE TWO CAPSULES BY MOUTH THREE TIMES DAILY AS NEEDED FOR  GOUT  ATTACKS  90 capsule  3  . metFORMIN (GLUCOPHAGE) 500 MG tablet TAKE ONE TABLET BY  MOUTH TWICE DAILY  60 tablet  3  . omeprazole (PRILOSEC) 20 MG capsule TAKE 1 CAP EVERY DAY (TAKEN 30 MINUTES BEFORE FIRST MEAL OF DAY)  30 capsule  0  . omeprazole (PRILOSEC) 20 MG capsule TAKE 1 CAP EVERY DAY 30 MINUTES BEFORE THE FIRST MEAL OF THE DAY  30 capsule  1  . pravastatin (PRAVACHOL) 80 MG tablet       . promethazine-codeine (PHENERGAN WITH CODEINE) 6.25-10 MG/5ML syrup Take 5-10 mLs by mouth 4 (four) times daily as needed. For cough       . ranitidine (ZANTAC) 300 MG tablet TAKE ONE TABLET BY MOUTH EVERY DAY IN THE EVENING  30 tablet  5  . tadalafil (CIALIS) 20 MG tablet Take as directed  10 tablet  5    No Known Allergies  Past Medical History  Diagnosis Date  . Chronic airway obstruction, not elsewhere classified   . Personal history of tobacco use, presenting hazards to health   . Unspecified essential hypertension   . Coronary atherosclerosis of artery bypass graft   . Other and unspecified hyperlipidemia   . Type II or unspecified type diabetes mellitus without mention of complication, not stated as uncontrolled   . Esophageal reflux   . Blood in stool   . Other specified disorder  of rectum and anus   . ED (erectile dysfunction)   . Osteoarthrosis, unspecified whether generalized or localized, unspecified site   . Gout, unspecified   . Anxiety state, unspecified     ROS: Negative except as per HPI  BP 143/82  Pulse 65  Ht 6\' 1"  (1.854 m)  Wt 92.715 kg (204 lb 6.4 oz)  BMI 26.97 kg/m2  PHYSICAL EXAM: Pt is alert and oriented, NAD HEENT: normal Neck: JVP - normal, carotids 2+= without bruits Lungs: CTA bilaterally CV: RRR without murmur or gallop Abd: soft, NT Ext: no C/C/E, distal pulses intact and equal Skin: warm/dry no rash  EKG:  Normal sinus rhythm 64 beats per minute, nonspecific T wave flattening, otherwise within normal limits.  ASSESSMENT AND PLAN: 1. CAD status post CABG. The patient remained stable without anginal symptoms. His blood  pressure is controlled. His medications were reviewed and they are appropriate. Will continue his same program without changes. I will see him back in 12 months.  2. Hypertension. Blood pressure is well controlled on multidrug therapy with amlodipine, benazepril, carvedilol, and hydralazine. Renal arterial duplex last year was negative for renal artery stenosis.  3. Hypercholesterolemia. The patient is on pravastatin. He will be due for lipids and LFTs this year.  For followup I will see him back in 12 months.  Sherren Mocha 05/02/2012 10:56 AM

## 2012-05-02 NOTE — Patient Instructions (Addendum)
Your physician recommends that you continue on your current medications as directed. Please refer to the Current Medication list given to you today.  Your physician wants you to follow-up in: 1 year. You will receive a reminder letter in the mail two months in advance. If you don't receive a letter, please call our office to schedule the follow-up appointment.  

## 2012-05-12 ENCOUNTER — Other Ambulatory Visit: Payer: Self-pay | Admitting: Internal Medicine

## 2012-05-23 ENCOUNTER — Other Ambulatory Visit: Payer: Self-pay | Admitting: Cardiovascular Disease

## 2012-05-27 NOTE — Telephone Encounter (Signed)
New Problem   Refill Request Hydralazine 50 mg to [facility] Trazastatin 80 mg to [facility]

## 2012-06-09 ENCOUNTER — Encounter: Payer: Self-pay | Admitting: Internal Medicine

## 2012-06-09 ENCOUNTER — Ambulatory Visit (INDEPENDENT_AMBULATORY_CARE_PROVIDER_SITE_OTHER): Payer: Medicare Other | Admitting: Internal Medicine

## 2012-06-09 VITALS — BP 168/82 | HR 76 | Temp 98.0°F | Resp 16 | Wt 203.0 lb

## 2012-06-09 DIAGNOSIS — I2510 Atherosclerotic heart disease of native coronary artery without angina pectoris: Secondary | ICD-10-CM

## 2012-06-09 DIAGNOSIS — I10 Essential (primary) hypertension: Secondary | ICD-10-CM

## 2012-06-09 NOTE — Assessment & Plan Note (Signed)
No CP 

## 2012-06-09 NOTE — Progress Notes (Signed)
   Subjective:    Patient ID: Harold Pittman, male    DOB: March 10, 1941, 72 y.o.   MRN: [id number]  HPI  The patient presents for a follow-up of  chronic hypertension, chronic dyslipidemia, type 2 diabetes controlled with medicines  SBP 100-107 at home F/u B feet swelling - resolved    Review of Systems  Constitutional: Negative for appetite change, fatigue and unexpected weight change.  HENT: Negative for nosebleeds, congestion, sore throat, sneezing, trouble swallowing and neck pain.   Eyes: Negative for itching and visual disturbance.  Respiratory: Negative for cough.   Cardiovascular: Negative for chest pain, palpitations and leg swelling.  Gastrointestinal: Negative for nausea, diarrhea, blood in stool and abdominal distention.  Genitourinary: Negative for frequency and hematuria.  Musculoskeletal: Negative for back pain, joint swelling and gait problem.  Skin: Negative for rash.  Neurological: Negative for dizziness, tremors, speech difficulty and weakness.  Psychiatric/Behavioral: Negative for sleep disturbance, dysphoric mood and agitation. The patient is not nervous/anxious.    BP Readings from Last 3 Encounters:  06/09/12 168/82  05/02/12 143/82  02/08/12 122/64   Wt Readings from Last 3 Encounters:  06/09/12 203 lb (92.08 kg)  05/02/12 204 lb 6.4 oz (92.715 kg)  02/08/12 200 lb (90.719 kg)        Objective:   Physical Exam  Constitutional: He is oriented to person, place, and time. He appears well-developed.  Eyes: Conjunctivae are normal. Pupils are equal, round, and reactive to light.  Neck: Normal range of motion. No JVD present. No thyromegaly present.  Cardiovascular: Normal rate, regular rhythm, normal heart sounds and intact distal pulses.  Exam reveals no gallop and no friction rub.   No murmur heard. Pulmonary/Chest: Effort normal and breath sounds normal. No respiratory distress. He has no wheezes. He has no rales. He exhibits no tenderness.   Abdominal: Soft. Bowel sounds are normal. He exhibits no distension and no mass. There is no tenderness. There is no rebound and no guarding.  Musculoskeletal: Normal range of motion. He exhibits no edema and no tenderness.  Lymphadenopathy:    He has no cervical adenopathy.  Neurological: He is alert and oriented to person, place, and time. He has normal reflexes. No cranial nerve deficit. He exhibits normal muscle tone. Coordination normal.  Skin: Skin is warm and dry. No rash noted.  Psychiatric: He has a normal mood and affect. His behavior is normal. Judgment and thought content normal.  B lat ankles are with no edema  Lab Results  Component Value Date   WBC 5.4 06/29/2011   HGB 11.8* 06/29/2011   HCT 36.5* 06/29/2011   PLT 155.0 06/29/2011   GLUCOSE 148* 02/08/2012   CHOL 129 03/15/2011   TRIG 106.0 03/15/2011   HDL 35.10* 03/15/2011   LDLCALC 73 03/15/2011   ALT 17 03/15/2011   AST 20 03/15/2011   NA 144 02/08/2012   K 3.7 02/08/2012   CL 108 02/08/2012   CREATININE 1.0 02/08/2012   BUN 11 02/08/2012   CO2 29 02/08/2012   TSH 0.91 10/08/2011   PSA 1.31 03/22/2010   HGBA1C 6.5 02/08/2012   MICROALBUR 57.7* 03/22/2010         Assessment & Plan:

## 2012-06-09 NOTE — Assessment & Plan Note (Signed)
Continue with current prescription therapy as reflected on the Med list.  

## 2012-06-17 ENCOUNTER — Other Ambulatory Visit: Payer: Self-pay | Admitting: Pulmonary Disease

## 2012-09-24 ENCOUNTER — Other Ambulatory Visit: Payer: Self-pay | Admitting: Internal Medicine

## 2012-10-08 ENCOUNTER — Encounter: Payer: Self-pay | Admitting: Internal Medicine

## 2012-10-08 ENCOUNTER — Ambulatory Visit (INDEPENDENT_AMBULATORY_CARE_PROVIDER_SITE_OTHER): Payer: Medicare Other | Admitting: Internal Medicine

## 2012-10-08 ENCOUNTER — Other Ambulatory Visit (INDEPENDENT_AMBULATORY_CARE_PROVIDER_SITE_OTHER): Payer: Medicare Other

## 2012-10-08 VITALS — BP 140/90 | HR 80 | Temp 98.0°F | Resp 16 | Wt 199.0 lb

## 2012-10-08 DIAGNOSIS — R0609 Other forms of dyspnea: Secondary | ICD-10-CM

## 2012-10-08 DIAGNOSIS — I2510 Atherosclerotic heart disease of native coronary artery without angina pectoris: Secondary | ICD-10-CM

## 2012-10-08 DIAGNOSIS — R197 Diarrhea, unspecified: Secondary | ICD-10-CM | POA: Insufficient documentation

## 2012-10-08 DIAGNOSIS — I10 Essential (primary) hypertension: Secondary | ICD-10-CM

## 2012-10-08 DIAGNOSIS — M109 Gout, unspecified: Secondary | ICD-10-CM

## 2012-10-08 DIAGNOSIS — R0989 Other specified symptoms and signs involving the circulatory and respiratory systems: Secondary | ICD-10-CM

## 2012-10-08 DIAGNOSIS — E119 Type 2 diabetes mellitus without complications: Secondary | ICD-10-CM

## 2012-10-08 DIAGNOSIS — G4700 Insomnia, unspecified: Secondary | ICD-10-CM

## 2012-10-08 DIAGNOSIS — I25810 Atherosclerosis of coronary artery bypass graft(s) without angina pectoris: Secondary | ICD-10-CM

## 2012-10-08 DIAGNOSIS — F411 Generalized anxiety disorder: Secondary | ICD-10-CM

## 2012-10-08 DIAGNOSIS — K219 Gastro-esophageal reflux disease without esophagitis: Secondary | ICD-10-CM

## 2012-10-08 LAB — BASIC METABOLIC PANEL
BUN: 14 mg/dL (ref 6–23)
Chloride: 106 mEq/L (ref 96–112)
Potassium: 4.1 mEq/L (ref 3.5–5.1)

## 2012-10-08 LAB — HEMOGLOBIN A1C: Hgb A1c MFr Bld: 7.3 % — ABNORMAL HIGH (ref 4.6–6.5)

## 2012-10-08 MED ORDER — LOPERAMIDE HCL 2 MG PO TABS: 2.0000 mg | ORAL_TABLET | Freq: Four times a day (QID) | ORAL | Status: DC | PRN

## 2012-10-08 NOTE — Assessment & Plan Note (Signed)
Continue with current prescription therapy as reflected on the Med list.  

## 2012-10-08 NOTE — Assessment & Plan Note (Signed)
Only when eating out - he needs to figure out what gives diarrhea Imodium prn

## 2012-10-08 NOTE — Assessment & Plan Note (Signed)
Stable

## 2012-10-08 NOTE — Progress Notes (Signed)
  Subjective:    HPI  The patient presents for a follow-up of  chronic hypertension, chronic dyslipidemia, type 2 diabetes controlled with medicines  SBP 100-107 at home F/u B feet swelling - resolved    Review of Systems  Constitutional: Negative for appetite change, fatigue and unexpected weight change.  HENT: Negative for nosebleeds, congestion, sore throat, sneezing, trouble swallowing and neck pain.   Eyes: Negative for itching and visual disturbance.  Respiratory: Negative for cough.   Cardiovascular: Negative for chest pain, palpitations and leg swelling.  Gastrointestinal: Negative for nausea, diarrhea, blood in stool and abdominal distention.  Genitourinary: Negative for frequency and hematuria.  Musculoskeletal: Negative for back pain, joint swelling and gait problem.  Skin: Negative for rash.  Neurological: Negative for dizziness, tremors, speech difficulty and weakness.  Psychiatric/Behavioral: Negative for sleep disturbance, dysphoric mood and agitation. The patient is not nervous/anxious.    BP Readings from Last 3 Encounters:  10/08/12 160/90  06/09/12 168/82  05/02/12 143/82   Wt Readings from Last 3 Encounters:  10/08/12 199 lb (90.266 kg)  06/09/12 203 lb (92.08 kg)  05/02/12 204 lb 6.4 oz (92.715 kg)        Objective:   Physical Exam  Constitutional: He is oriented to person, place, and time. He appears well-developed.  Eyes: Conjunctivae are normal. Pupils are equal, round, and reactive to light.  Neck: Normal range of motion. No JVD present. No thyromegaly present.  Cardiovascular: Normal rate, regular rhythm, normal heart sounds and intact distal pulses.  Exam reveals no gallop and no friction rub.   No murmur heard. Pulmonary/Chest: Effort normal and breath sounds normal. No respiratory distress. He has no wheezes. He has no rales. He exhibits no tenderness.  Abdominal: Soft. Bowel sounds are normal. He exhibits no distension and no mass. There is  no tenderness. There is no rebound and no guarding.  Musculoskeletal: Normal range of motion. He exhibits no edema and no tenderness.  Lymphadenopathy:    He has no cervical adenopathy.  Neurological: He is alert and oriented to person, place, and time. He has normal reflexes. No cranial nerve deficit. He exhibits normal muscle tone. Coordination normal.  Skin: Skin is warm and dry. No rash noted.  Psychiatric: He has a normal mood and affect. His behavior is normal. Judgment and thought content normal.  B lat ankles are with no edema  Lab Results  Component Value Date   WBC 5.4 06/29/2011   HGB 11.8* 06/29/2011   HCT 36.5* 06/29/2011   PLT 155.0 06/29/2011   GLUCOSE 148* 02/08/2012   CHOL 129 03/15/2011   TRIG 106.0 03/15/2011   HDL 35.10* 03/15/2011   LDLCALC 73 03/15/2011   ALT 17 03/15/2011   AST 20 03/15/2011   NA 144 02/08/2012   K 3.7 02/08/2012   CL 108 02/08/2012   CREATININE 1.0 02/08/2012   BUN 11 02/08/2012   CO2 29 02/08/2012   TSH 0.91 10/08/2011   PSA 1.31 03/22/2010   HGBA1C 6.5 02/08/2012   MICROALBUR 57.7* 03/22/2010         Assessment & Plan:

## 2012-10-08 NOTE — Assessment & Plan Note (Signed)
No relapse 

## 2012-10-09 ENCOUNTER — Telehealth: Payer: Self-pay | Admitting: Internal Medicine

## 2012-10-09 ENCOUNTER — Other Ambulatory Visit: Payer: Self-pay | Admitting: Internal Medicine

## 2012-10-09 NOTE — Telephone Encounter (Signed)
Pt is requesting test results from yesterday. [id number]

## 2012-10-09 NOTE — Telephone Encounter (Signed)
Pt informed of results.

## 2012-10-29 ENCOUNTER — Other Ambulatory Visit: Payer: Self-pay | Admitting: Internal Medicine

## 2013-01-09 ENCOUNTER — Telehealth: Payer: Self-pay

## 2013-01-09 NOTE — Telephone Encounter (Signed)
Patent advised via identified voicemail.

## 2013-01-09 NOTE — Telephone Encounter (Signed)
No more than once a week due to "advil" in it. Tylenol pm would be safer with coumadin and ok to take more often Thx

## 2013-01-09 NOTE — Telephone Encounter (Signed)
Phone call from patient wanting to know if it's okay to take Advil pm? Please advise.

## 2013-01-27 ENCOUNTER — Other Ambulatory Visit: Payer: Self-pay | Admitting: *Deleted

## 2013-01-27 MED ORDER — OMEPRAZOLE 20 MG PO CPDR: 20.0000 mg | DELAYED_RELEASE_CAPSULE | Freq: Every day | ORAL | Status: DC

## 2013-02-10 ENCOUNTER — Ambulatory Visit: Payer: Medicare Other | Admitting: Internal Medicine

## 2013-02-10 DIAGNOSIS — Z0289 Encounter for other administrative examinations: Secondary | ICD-10-CM

## 2013-03-06 ENCOUNTER — Telehealth: Payer: Self-pay | Admitting: *Deleted

## 2013-03-06 MED ORDER — METFORMIN HCL 500 MG PO TABS: ORAL_TABLET | ORAL | Status: DC

## 2013-03-06 NOTE — Telephone Encounter (Signed)
Pt called requesting a 90-day supply refill on metformin sent to [facility].  Refill done.

## 2013-03-16 ENCOUNTER — Other Ambulatory Visit (INDEPENDENT_AMBULATORY_CARE_PROVIDER_SITE_OTHER): Payer: Medicare Other

## 2013-03-16 ENCOUNTER — Ambulatory Visit (INDEPENDENT_AMBULATORY_CARE_PROVIDER_SITE_OTHER): Payer: Medicare Other | Admitting: Internal Medicine

## 2013-03-16 ENCOUNTER — Encounter: Payer: Self-pay | Admitting: Internal Medicine

## 2013-03-16 VITALS — BP 160/96 | HR 80 | Temp 98.2°F | Resp 16 | Wt 204.0 lb

## 2013-03-16 DIAGNOSIS — H6122 Impacted cerumen, left ear: Secondary | ICD-10-CM

## 2013-03-16 DIAGNOSIS — I2510 Atherosclerotic heart disease of native coronary artery without angina pectoris: Secondary | ICD-10-CM

## 2013-03-16 DIAGNOSIS — I10 Essential (primary) hypertension: Secondary | ICD-10-CM

## 2013-03-16 DIAGNOSIS — E119 Type 2 diabetes mellitus without complications: Secondary | ICD-10-CM

## 2013-03-16 DIAGNOSIS — H6120 Impacted cerumen, unspecified ear: Secondary | ICD-10-CM

## 2013-03-16 DIAGNOSIS — M1990 Unspecified osteoarthritis, unspecified site: Secondary | ICD-10-CM

## 2013-03-16 DIAGNOSIS — Z23 Encounter for immunization: Secondary | ICD-10-CM

## 2013-03-16 LAB — HEMOGLOBIN A1C: Hgb A1c MFr Bld: 6.8 % — ABNORMAL HIGH (ref 4.6–6.5)

## 2013-03-16 LAB — BASIC METABOLIC PANEL
BUN: 10 mg/dL (ref 6–23)
Calcium: 8.8 mg/dL (ref 8.4–10.5)
Creatinine, Ser: 0.9 mg/dL (ref 0.4–1.5)

## 2013-03-16 LAB — HEPATIC FUNCTION PANEL
ALT: 18 U/L (ref 0–53)
AST: 20 U/L (ref 0–37)
Albumin: 4 g/dL (ref 3.5–5.2)
Alkaline Phosphatase: 59 U/L (ref 39–117)
Total Protein: 7 g/dL (ref 6.0–8.3)

## 2013-03-16 MED ORDER — PRAVASTATIN SODIUM 80 MG PO TABS: 80.0000 mg | ORAL_TABLET | Freq: Every day | ORAL | Status: DC

## 2013-03-16 MED ORDER — CARVEDILOL 25 MG PO TABS: 25.0000 mg | ORAL_TABLET | Freq: Two times a day (BID) | ORAL | Status: DC

## 2013-03-16 NOTE — Assessment & Plan Note (Signed)
Continue with current prescription therapy as reflected on the Med list.  

## 2013-03-16 NOTE — Assessment & Plan Note (Signed)
Chronic BP is nl at home Continue with current prescription therapy as reflected on the Med list.

## 2013-03-16 NOTE — Progress Notes (Signed)
Subjective:    HPI  The patient presents for a follow-up of  chronic hypertension, chronic dyslipidemia, type 2 diabetes controlled with medicines  SBP 100-107 at home F/u B feet swelling - resolved    Review of Systems  Constitutional: Negative for appetite change, fatigue and unexpected weight change.  HENT: Negative for congestion, nosebleeds, sneezing, sore throat and trouble swallowing.   Eyes: Negative for itching and visual disturbance.  Respiratory: Negative for cough.   Cardiovascular: Negative for chest pain, palpitations and leg swelling.  Gastrointestinal: Negative for nausea, diarrhea, blood in stool and abdominal distention.  Genitourinary: Negative for frequency and hematuria.  Musculoskeletal: Negative for back pain, gait problem, joint swelling and neck pain.  Skin: Negative for rash.  Neurological: Negative for dizziness, tremors, speech difficulty and weakness.  Psychiatric/Behavioral: Negative for sleep disturbance, dysphoric mood and agitation. The patient is not nervous/anxious.    BP Readings from Last 3 Encounters:  03/16/13 160/96  10/08/12 140/90  06/09/12 168/82   Wt Readings from Last 3 Encounters:  03/16/13 204 lb (92.534 kg)  10/08/12 199 lb (90.266 kg)  06/09/12 203 lb (92.08 kg)        Objective:   Physical Exam  Constitutional: He is oriented to person, place, and time. He appears well-developed.  Eyes: Conjunctivae are normal. Pupils are equal, round, and reactive to light.  Neck: Normal range of motion. No JVD present. No thyromegaly present.  Cardiovascular: Normal rate, regular rhythm, normal heart sounds and intact distal pulses.  Exam reveals no gallop and no friction rub.   No murmur heard. Pulmonary/Chest: Effort normal and breath sounds normal. No respiratory distress. He has no wheezes. He has no rales. He exhibits no tenderness.  Abdominal: Soft. Bowel sounds are normal. He exhibits no distension and no mass. There is no  tenderness. There is no rebound and no guarding.  Musculoskeletal: Normal range of motion. He exhibits no edema and no tenderness.  Lymphadenopathy:    He has no cervical adenopathy.  Neurological: He is alert and oriented to person, place, and time. He has normal reflexes. No cranial nerve deficit. He exhibits normal muscle tone. Coordination normal.  Skin: Skin is warm and dry. No rash noted.  Psychiatric: He has a normal mood and affect. His behavior is normal. Judgment and thought content normal.  B lat ankles are with no edema L ear - wax imp  Lab Results  Component Value Date   WBC 5.4 06/29/2011   HGB 11.8* 06/29/2011   HCT 36.5* 06/29/2011   PLT 155.0 06/29/2011   GLUCOSE 160* 10/08/2012   CHOL 129 03/15/2011   TRIG 106.0 03/15/2011   HDL 35.10* 03/15/2011   LDLCALC 73 03/15/2011   ALT 17 03/15/2011   AST 20 03/15/2011   NA 143 10/08/2012   K 4.1 10/08/2012   CL 106 10/08/2012   CREATININE 1.0 10/08/2012   BUN 14 10/08/2012   CO2 30 10/08/2012   TSH 0.91 10/08/2011   PSA 1.31 03/22/2010   HGBA1C 7.3* 10/08/2012   MICROALBUR 57.7* 03/22/2010     Procedure Note :     Procedure :  Ear irrigation   Indication:  Cerumen impaction L   Risks, including pain, dizziness, eardrum perforation, bleeding, infection and others as well as benefits were explained to the patient in detail. Verbal consent was obtained and the patient agreed to proceed.    We used "The Elephant Ear Irrigation Device" filled with lukewarm water for irrigation. A large amount wax was  recovered. Procedure has also required manual wax removal with an ear loop.   Tolerated well. Complications: None.   Postprocedure instructions :  Call if problems.       Assessment & Plan:

## 2013-03-16 NOTE — Progress Notes (Signed)
Pre visit review using our clinic review tool, if applicable. No additional management support is needed unless otherwise documented below in the visit note. 

## 2013-03-16 NOTE — Assessment & Plan Note (Signed)
12/14 left See procedure

## 2013-03-16 NOTE — Assessment & Plan Note (Signed)
Doing well 

## 2013-03-25 ENCOUNTER — Ambulatory Visit (INDEPENDENT_AMBULATORY_CARE_PROVIDER_SITE_OTHER): Payer: Medicare Other | Admitting: Internal Medicine

## 2013-03-25 ENCOUNTER — Encounter: Payer: Self-pay | Admitting: Internal Medicine

## 2013-03-25 VITALS — BP 138/70 | HR 89 | Temp 100.3°F | Wt 200.0 lb

## 2013-03-25 DIAGNOSIS — J069 Acute upper respiratory infection, unspecified: Secondary | ICD-10-CM

## 2013-03-25 DIAGNOSIS — J449 Chronic obstructive pulmonary disease, unspecified: Secondary | ICD-10-CM

## 2013-03-25 DIAGNOSIS — J4489 Other specified chronic obstructive pulmonary disease: Secondary | ICD-10-CM

## 2013-03-25 DIAGNOSIS — I10 Essential (primary) hypertension: Secondary | ICD-10-CM

## 2013-03-25 MED ORDER — ZOLPIDEM TARTRATE 5 MG PO TABS: 5.0000 mg | ORAL_TABLET | Freq: Every evening | ORAL | Status: DC | PRN

## 2013-03-25 MED ORDER — HYDROCODONE-HOMATROPINE 5-1.5 MG/5ML PO SYRP: 5.0000 mL | ORAL_SOLUTION | Freq: Four times a day (QID) | ORAL | Status: DC | PRN

## 2013-03-25 MED ORDER — AZITHROMYCIN 250 MG PO TABS: ORAL_TABLET | ORAL | Status: DC

## 2013-03-25 NOTE — Patient Instructions (Addendum)
Please take all new medication as prescribed - the antibiotic, cough medicine, and ambien for sleep Please continue all other medications as before Please have the pharmacy call with any other refills you may need.  Please remember to sign up for My Chart if you have not done so, as this will be important to you in the future with finding out test results, communicating by private email, and scheduling acute appointments online when needed.

## 2013-03-25 NOTE — Assessment & Plan Note (Signed)
Mild to mod, for antibx course,  to f/u any worsening symptoms or concerns 

## 2013-03-25 NOTE — Assessment & Plan Note (Signed)
stable overall by history and exam, recent data reviewed with pt, and pt to continue medical treatment as before,  to f/u any worsening symptoms or concerns BP Readings from Last 3 Encounters:  03/25/13 138/70  03/16/13 160/96  10/08/12 140/90

## 2013-03-25 NOTE — Assessment & Plan Note (Signed)
stable overall by history and exam, recent data reviewed with pt, and pt to continue medical treatment as before,  to f/u any worsening symptoms or concerns SpO2 Readings from Last 3 Encounters:  03/25/13 94%  02/08/12 97%  09/26/10 96%

## 2013-03-25 NOTE — Progress Notes (Signed)
Pre-visit discussion using our clinic review tool. No additional management support is needed unless otherwise documented below in the visit note.  

## 2013-03-25 NOTE — Progress Notes (Signed)
Subjective:    Patient ID: Harold Pittman, male    DOB: October 11, 1940, 72 y.o.   MRN: [id number]  HPI   Here with 2-3 days acute onset fever, facial pain, pressure, headache, general weakness and malaise, and greenish d/c, with mild ST and mild prod cough, but pt denies chest pain, wheezing, increased sob or doe, orthopnea, PND, increased LE swelling, palpitations, dizziness or syncope.  Pt denies new neurological symptoms such as new headache, or facial or extremity weakness or numbness   Pt denies polydipsia, polyuria.  Also with signficant insomina recently, Denies worsening depressive symptoms, suicidal ideation. Past Medical History  Diagnosis Date  . Chronic airway obstruction, not elsewhere classified   . Personal history of tobacco use, presenting hazards to health   . Unspecified essential hypertension   . Coronary atherosclerosis of artery bypass graft   . Other and unspecified hyperlipidemia   . Type II or unspecified type diabetes mellitus without mention of complication, not stated as uncontrolled   . Esophageal reflux   . Blood in stool   . Other specified disorder of rectum and anus   . ED (erectile dysfunction)   . Osteoarthrosis, unspecified whether generalized or localized, unspecified site   . Gout, unspecified   . Anxiety state, unspecified    Past Surgical History  Procedure Laterality Date  . Coronary artery bypass graft  97    LIMA to LAD, sequential saphenous vein graft to the first and second diagonal, sequential saphenous vein graft to the intermediate OM and circumflex and SVG to RCA  . Rotator cuff repair    . Knee surgery      BILATERAL    reports that he has quit smoking. He has never used smokeless tobacco. He reports that he does not drink alcohol or use illicit drugs. family history includes Coronary artery disease in his other; Depression in his sister; Heart disease in his mother and sister; Hypertension in his other; Mental illness in his father. There  is no history of Colon cancer. No Known Allergies Current Outpatient Prescriptions on File Prior to Visit  Medication Sig Dispense Refill  . albuterol (PROAIR HFA) 108 (90 BASE) MCG/ACT inhaler Inhale 2 puffs into the lungs 2 (two) times daily.  1 Inhaler  11  . amLODipine (NORVASC) 10 MG tablet TAKE ONE TABLET BY MOUTH EVERY DAY  30 tablet  5  . aspirin 81 MG EC tablet Take 81 mg by mouth daily.        . beclomethasone (QVAR) 80 MCG/ACT inhaler Inhale 2 puffs into the lungs 2 (two) times daily.  1 Inhaler  11  . benazepril (LOTENSIN) 40 MG tablet 1 tab po qd  90 tablet  3  . carvedilol (COREG) 25 MG tablet Take 1 tablet (25 mg total) by mouth 2 (two) times daily with a meal.  180 tablet  3  . Cholecalciferol 1000 UNITS tablet Take 2,000 Units by mouth daily.        . furosemide (LASIX) 20 MG tablet TAKE ONE TABLET BY MOUTH IN THE MORNING  90 tablet  2  . hydrALAZINE (APRESOLINE) 50 MG tablet TAKE ONE TABLET BY MOUTH THREE TIMES DAILY  270 tablet  3  . indomethacin (INDOCIN) 25 MG capsule TAKE TWO CAPSULES BY MOUTH THREE TIMES DAILY AS NEEDED FOR  GOUT  ATTACKS  90 capsule  3  . loperamide (IMODIUM A-D) 2 MG tablet Take 1 tablet (2 mg total) by mouth 4 (four) times daily as needed  for diarrhea or loose stools.  30 tablet  1  . metFORMIN (GLUCOPHAGE) 500 MG tablet TAKE ONE TABLET BY MOUTH TWICE DAILY  180 tablet  0  . omeprazole (PRILOSEC) 20 MG capsule Take 1 capsule (20 mg total) by mouth daily.  30 capsule  5  . pravastatin (PRAVACHOL) 80 MG tablet Take 1 tablet (80 mg total) by mouth at bedtime.  90 tablet  3  . promethazine-codeine (PHENERGAN WITH CODEINE) 6.25-10 MG/5ML syrup Take 5-10 mLs by mouth 4 (four) times daily as needed. For cough       . ranitidine (ZANTAC) 300 MG tablet TAKE ONE TABLET BY MOUTH IN THE EVENING  30 tablet  5  . tadalafil (CIALIS) 20 MG tablet Take as directed  10 tablet  5   No current facility-administered medications on file prior to visit.    Review of  Systems  Constitutional: Negative for unexpected weight change, or unusual diaphoresis  HENT: Negative for tinnitus.   Eyes: Negative for photophobia and visual disturbance.  Respiratory: Negative for choking and stridor.   Gastrointestinal: Negative for vomiting and blood in stool.  Genitourinary: Negative for hematuria and decreased urine volume.  Musculoskeletal: Negative for acute joint swelling Skin: Negative for color change and wound.  Neurological: Negative for tremors and numbness other than noted  Psychiatric/Behavioral: Negative for decreased concentration or  hyperactivity.       Objective:   Physical Exam BP 138/70  Pulse 89  Temp(Src) 100.3 F (37.9 C) (Oral)  Wt 200 lb (90.719 kg)  SpO2 94% VS noted, mild ill Constitutional: Pt appears well-developed and well-nourished.  HENT: Head: NCAT.  Right Ear: External ear normal.  Left Ear: External ear normal.  Bilat tm's with mild erythema.  Max sinus areas mild tender.  Pharynx with mild erythema, no exudate Eyes: Conjunctivae and EOM are normal. Pupils are equal, round, and reactive to light.  Neck: Normal range of motion. Neck supple.  Cardiovascular: Normal rate and regular rhythm.   Pulmonary/Chest: Effort normal and breath sounds normal.  Neurological: Pt is alert. Not confused  Skin: Skin is warm. No erythema.  Psychiatric: Pt behavior is normal. Thought content normal. not depressed affect    Assessment & Plan:

## 2013-04-08 ENCOUNTER — Ambulatory Visit: Payer: Medicare Other | Admitting: Pulmonary Disease

## 2013-05-04 ENCOUNTER — Other Ambulatory Visit: Payer: Self-pay | Admitting: Internal Medicine

## 2013-05-05 ENCOUNTER — Encounter: Payer: Self-pay | Admitting: Cardiovascular Disease

## 2013-05-05 ENCOUNTER — Ambulatory Visit (INDEPENDENT_AMBULATORY_CARE_PROVIDER_SITE_OTHER): Payer: Medicare Other | Admitting: Cardiovascular Disease

## 2013-05-05 ENCOUNTER — Encounter (INDEPENDENT_AMBULATORY_CARE_PROVIDER_SITE_OTHER): Payer: Self-pay

## 2013-05-05 VITALS — BP 140/76 | HR 67 | Ht 74.0 in | Wt 201.0 lb

## 2013-05-05 DIAGNOSIS — I2510 Atherosclerotic heart disease of native coronary artery without angina pectoris: Secondary | ICD-10-CM

## 2013-05-05 NOTE — Progress Notes (Signed)
HPI:  73 year old gentleman presenting for followup of coronary artery disease with history of coronary bypass surgery in 1997. He's also followed for hypertension, hyperlipidemia, and chronic dyspnea. Blood pressure has been better controlled since adding hydralazine a few years ago. Lipids from 2012 showed a cholesterol of 129, triglycerides 106, HDL 35, and LDL 73. LFTs were checked recently and they were within normal limits. Last hemoglobin A1c was 6.8.  The patient feels well. He has mild dyspnea with exertion and this is been present for several years. This occurs with walking up stairs. Otherwise he is having no problems. He denies chest pain or pressure, palpitations, or leg swelling. His blood pressure has been better controlled. He is compliant with his medications.  Outpatient Encounter Prescriptions as of 05/05/2013  Medication Sig  . albuterol (PROAIR HFA) 108 (90 BASE) MCG/ACT inhaler Inhale 2 puffs into the lungs 2 (two) times daily.  Marland Kitchen amLODipine (NORVASC) 10 MG tablet TAKE ONE TABLET BY MOUTH ONCE DAILY  . aspirin 81 MG EC tablet Take 81 mg by mouth daily.    . beclomethasone (QVAR) 80 MCG/ACT inhaler Inhale 2 puffs into the lungs 2 (two) times daily.  . benazepril (LOTENSIN) 40 MG tablet 1 tab po qd  . carvedilol (COREG) 25 MG tablet Take 1 tablet (25 mg total) by mouth 2 (two) times daily with a meal.  . Cholecalciferol 1000 UNITS tablet Take 2,000 Units by mouth daily.    . furosemide (LASIX) 20 MG tablet TAKE ONE TABLET BY MOUTH IN THE MORNING  . hydrALAZINE (APRESOLINE) 50 MG tablet TAKE ONE TABLET BY MOUTH THREE TIMES DAILY  . HYDROcodone-homatropine (HYCODAN) 5-1.5 MG/5ML syrup Take 5 mLs by mouth every 6 (six) hours as needed for cough.  . indomethacin (INDOCIN) 25 MG capsule TAKE TWO CAPSULES BY MOUTH THREE TIMES DAILY AS NEEDED FOR  GOUT  ATTACKS  . loperamide (IMODIUM A-D) 2 MG tablet Take 1 tablet (2 mg total) by mouth 4 (four) times daily as needed for diarrhea or  loose stools.  . metFORMIN (GLUCOPHAGE) 500 MG tablet TAKE ONE TABLET BY MOUTH TWICE DAILY  . omeprazole (PRILOSEC) 20 MG capsule Take 1 capsule (20 mg total) by mouth daily.  . pravastatin (PRAVACHOL) 80 MG tablet Take 1 tablet (80 mg total) by mouth at bedtime.  . promethazine-codeine (PHENERGAN WITH CODEINE) 6.25-10 MG/5ML syrup Take 5-10 mLs by mouth 4 (four) times daily as needed. For cough   . ranitidine (ZANTAC) 300 MG tablet TAKE ONE TABLET BY MOUTH IN THE EVENING  . tadalafil (CIALIS) 20 MG tablet Take as directed  . zolpidem (AMBIEN) 5 MG tablet Take 1 tablet (5 mg total) by mouth at bedtime as needed for sleep.  . [DISCONTINUED] azithromycin (ZITHROMAX Z-PAK) 250 MG tablet Use as directed    No Known Allergies  Past Medical History  Diagnosis Date  . Chronic airway obstruction, not elsewhere classified   . Personal history of tobacco use, presenting hazards to health   . Unspecified essential hypertension   . Coronary atherosclerosis of artery bypass graft   . Other and unspecified hyperlipidemia   . Type II or unspecified type diabetes mellitus without mention of complication, not stated as uncontrolled   . Esophageal reflux   . Blood in stool   . Other specified disorder of rectum and anus   . ED (erectile dysfunction)   . Osteoarthrosis, unspecified whether generalized or localized, unspecified site   . Gout, unspecified   . Anxiety state, unspecified  ROS: Negative except as per HPI  BP 140/76  Pulse 67  Ht 6\' 2"  (1.88 m)  Wt 201 lb (91.173 kg)  BMI 25.80 kg/m2  PHYSICAL EXAM: Pt is alert and oriented, NAD HEENT: normal Neck: JVP - normal, carotids 2+= without bruits Lungs: CTA bilaterally CV: RRR without murmur or gallop Abd: soft, NT, Positive BS, no hepatomegaly Ext: no C/C/E, distal pulses intact and equal Skin: warm/dry no rash  EKG:  Normal sinus rhythm 67 beats per minute, voltage criteria for LVH, nonspecific T wave abnormality.  ASSESSMENT  AND PLAN: 1. Coronary artery disease status post CABG. The patient is stable without symptoms of angina. His medical program was reviewed and will be continued. He was advised to watch for symptoms reminiscent of his previous angina now he is 18 years out from coronary bypass. I will see him back in one year for followup.  2. Essential hypertension. Blood pressure is controlled on combination of amlodipine, benazepril, carvedilol, and hydralazine.  3. Hyperlipidemia. He continues on pravastatin 80 mg. Recent LFTs were normal.  For followup I will see him back in one year.  Sherren Mocha 05/05/2013 9:43 AM

## 2013-05-05 NOTE — Patient Instructions (Signed)
Your physician recommends that you continue on your current medications as directed. Please refer to the Current Medication list given to you today.  Your physician wants you to follow-up in: 1 year with Dr. Cooper.  You will receive a reminder letter in the mail two months in advance. If you don't receive a letter, please call our office to schedule the follow-up appointment.   

## 2013-05-11 ENCOUNTER — Other Ambulatory Visit: Payer: Self-pay | Admitting: Internal Medicine

## 2013-06-15 ENCOUNTER — Other Ambulatory Visit: Payer: Self-pay | Admitting: Cardiovascular Disease

## 2013-06-16 ENCOUNTER — Ambulatory Visit (INDEPENDENT_AMBULATORY_CARE_PROVIDER_SITE_OTHER): Payer: Medicare Other | Admitting: Internal Medicine

## 2013-06-16 ENCOUNTER — Encounter: Payer: Self-pay | Admitting: Internal Medicine

## 2013-06-16 ENCOUNTER — Other Ambulatory Visit: Payer: Self-pay | Admitting: *Deleted

## 2013-06-16 VITALS — BP 158/72 | HR 84 | Temp 97.6°F | Resp 16 | Wt 200.0 lb

## 2013-06-16 DIAGNOSIS — K529 Noninfective gastroenteritis and colitis, unspecified: Secondary | ICD-10-CM

## 2013-06-16 DIAGNOSIS — F411 Generalized anxiety disorder: Secondary | ICD-10-CM

## 2013-06-16 DIAGNOSIS — K5289 Other specified noninfective gastroenteritis and colitis: Secondary | ICD-10-CM

## 2013-06-16 DIAGNOSIS — I10 Essential (primary) hypertension: Secondary | ICD-10-CM

## 2013-06-16 DIAGNOSIS — I2510 Atherosclerotic heart disease of native coronary artery without angina pectoris: Secondary | ICD-10-CM

## 2013-06-16 DIAGNOSIS — E119 Type 2 diabetes mellitus without complications: Secondary | ICD-10-CM

## 2013-06-16 MED ORDER — DIPHENOXYLATE-ATROPINE 2.5-0.025 MG PO TABS: 1.0000 | ORAL_TABLET | Freq: Four times a day (QID) | ORAL | Status: DC | PRN

## 2013-06-16 MED ORDER — METFORMIN HCL 500 MG PO TABS: ORAL_TABLET | ORAL | Status: DC

## 2013-06-16 MED ORDER — PROMETHAZINE HCL 25 MG PO TABS: 25.0000 mg | ORAL_TABLET | Freq: Three times a day (TID) | ORAL | Status: DC | PRN

## 2013-06-16 NOTE — Assessment & Plan Note (Signed)
Continue with current prescription therapy as reflected on the Med list.  

## 2013-06-16 NOTE — Assessment & Plan Note (Signed)
3/15 ?viral See meds

## 2013-06-16 NOTE — Assessment & Plan Note (Signed)
Continue with current prescription prn therapy as reflected on the Med list.  

## 2013-06-16 NOTE — Progress Notes (Signed)
Pre visit review using our clinic review tool, if applicable. No additional management support is needed unless otherwise documented below in the visit note. 

## 2013-06-16 NOTE — Progress Notes (Signed)
Patient ID: Harold Pittman, male   DOB: 1941/01/22, 73 y.o.   MRN: [id number]  Subjective:    Diarrhea  This is a new problem. The current episode started yesterday. The problem has been gradually improving. Associated symptoms include vomiting. Pertinent negatives include no abdominal pain or coughing. He has tried analgesics for the symptoms. The treatment provided no relief. There is no history of bowel resection or malabsorption.    The patient presents for a follow-up of  chronic hypertension, chronic dyslipidemia, type 2 diabetes controlled with medicines  SBP 100-107 at home F/u B feet swelling - resolved    Review of Systems  Constitutional: Negative for appetite change, fatigue and unexpected weight change.  HENT: Negative for congestion, nosebleeds, sneezing, sore throat and trouble swallowing.   Eyes: Negative for itching and visual disturbance.  Respiratory: Negative for cough.   Cardiovascular: Negative for chest pain, palpitations and leg swelling.  Gastrointestinal: Positive for vomiting. Negative for nausea, abdominal pain, diarrhea, blood in stool and abdominal distention.  Genitourinary: Negative for frequency and hematuria.  Musculoskeletal: Negative for back pain, gait problem, joint swelling and neck pain.  Skin: Negative for rash.  Neurological: Negative for dizziness, tremors, speech difficulty and weakness.  Psychiatric/Behavioral: Negative for sleep disturbance, dysphoric mood and agitation. The patient is not nervous/anxious.    BP Readings from Last 3 Encounters:  06/16/13 158/72  05/05/13 140/76  03/25/13 138/70   Wt Readings from Last 3 Encounters:  06/16/13 200 lb (90.719 kg)  05/05/13 201 lb (91.173 kg)  03/25/13 200 lb (90.719 kg)        Objective:   Physical Exam  Constitutional: He is oriented to person, place, and time. He appears well-developed.  Eyes: Conjunctivae are normal. Pupils are equal, round, and reactive to light.  Neck: Normal  range of motion. No JVD present. No thyromegaly present.  Cardiovascular: Normal rate, regular rhythm, normal heart sounds and intact distal pulses.  Exam reveals no gallop and no friction rub.   No murmur heard. Pulmonary/Chest: Effort normal and breath sounds normal. No respiratory distress. He has no wheezes. He has no rales. He exhibits no tenderness.  Abdominal: Soft. Bowel sounds are normal. He exhibits no distension and no mass. There is no tenderness. There is no rebound and no guarding.  Musculoskeletal: Normal range of motion. He exhibits no edema and no tenderness.  Lymphadenopathy:    He has no cervical adenopathy.  Neurological: He is alert and oriented to person, place, and time. He has normal reflexes. No cranial nerve deficit. He exhibits normal muscle tone. Coordination normal.  Skin: Skin is warm and dry. No rash noted.  Psychiatric: He has a normal mood and affect. His behavior is normal. Judgment and thought content normal.  B lat ankles are with no edema   Lab Results  Component Value Date   WBC 5.4 06/29/2011   HGB 11.8* 06/29/2011   HCT 36.5* 06/29/2011   PLT 155.0 06/29/2011   GLUCOSE 173* 03/16/2013   CHOL 129 03/15/2011   TRIG 106.0 03/15/2011   HDL 35.10* 03/15/2011   LDLCALC 73 03/15/2011   ALT 18 03/16/2013   AST 20 03/16/2013   NA 142 03/16/2013   K 3.6 03/16/2013   CL 107 03/16/2013   CREATININE 0.9 03/16/2013   BUN 10 03/16/2013   CO2 29 03/16/2013   TSH 0.91 10/08/2011   PSA 1.31 03/22/2010   HGBA1C 6.8* 03/16/2013   MICROALBUR 57.7* 03/22/2010      Assessment &  Plan:

## 2013-06-19 ENCOUNTER — Telehealth: Payer: Self-pay

## 2013-06-19 NOTE — Telephone Encounter (Signed)
Relevant patient education mailed to patient.  

## 2013-06-29 ENCOUNTER — Other Ambulatory Visit: Payer: Self-pay | Admitting: Internal Medicine

## 2013-06-30 ENCOUNTER — Other Ambulatory Visit: Payer: Self-pay | Admitting: Internal Medicine

## 2013-08-03 ENCOUNTER — Other Ambulatory Visit: Payer: Self-pay | Admitting: Internal Medicine

## 2013-08-03 NOTE — Telephone Encounter (Signed)
Rx request to pharmacy/[facility]  

## 2013-09-14 ENCOUNTER — Other Ambulatory Visit: Payer: Self-pay | Admitting: Internal Medicine

## 2013-09-16 ENCOUNTER — Other Ambulatory Visit (INDEPENDENT_AMBULATORY_CARE_PROVIDER_SITE_OTHER): Payer: Medicare Other

## 2013-09-16 ENCOUNTER — Encounter: Payer: Self-pay | Admitting: Internal Medicine

## 2013-09-16 ENCOUNTER — Ambulatory Visit (INDEPENDENT_AMBULATORY_CARE_PROVIDER_SITE_OTHER): Payer: Medicare Other | Admitting: Internal Medicine

## 2013-09-16 VITALS — BP 158/90 | HR 76 | Temp 98.7°F | Resp 16 | Wt 201.0 lb

## 2013-09-16 DIAGNOSIS — M109 Gout, unspecified: Secondary | ICD-10-CM

## 2013-09-16 DIAGNOSIS — E1151 Type 2 diabetes mellitus with diabetic peripheral angiopathy without gangrene: Secondary | ICD-10-CM | POA: Insufficient documentation

## 2013-09-16 DIAGNOSIS — H6122 Impacted cerumen, left ear: Secondary | ICD-10-CM

## 2013-09-16 DIAGNOSIS — I2510 Atherosclerotic heart disease of native coronary artery without angina pectoris: Secondary | ICD-10-CM

## 2013-09-16 DIAGNOSIS — E785 Hyperlipidemia, unspecified: Secondary | ICD-10-CM

## 2013-09-16 DIAGNOSIS — E119 Type 2 diabetes mellitus without complications: Secondary | ICD-10-CM

## 2013-09-16 DIAGNOSIS — I10 Essential (primary) hypertension: Secondary | ICD-10-CM

## 2013-09-16 DIAGNOSIS — H6120 Impacted cerumen, unspecified ear: Secondary | ICD-10-CM

## 2013-09-16 DIAGNOSIS — Z23 Encounter for immunization: Secondary | ICD-10-CM

## 2013-09-16 DIAGNOSIS — E1159 Type 2 diabetes mellitus with other circulatory complications: Secondary | ICD-10-CM

## 2013-09-16 DIAGNOSIS — M1990 Unspecified osteoarthritis, unspecified site: Secondary | ICD-10-CM

## 2013-09-16 LAB — BASIC METABOLIC PANEL
BUN: 10 mg/dL (ref 6–23)
CO2: 29 meq/L (ref 19–32)
Calcium: 9.3 mg/dL (ref 8.4–10.5)
Chloride: 107 mEq/L (ref 96–112)
Creatinine, Ser: 1 mg/dL (ref 0.4–1.5)
GFR: 96.57 mL/min (ref >60.00)
Glucose, Bld: 148 mg/dL — ABNORMAL HIGH (ref 70–99)
POTASSIUM: 4.7 meq/L (ref 3.5–5.1)
SODIUM: 143 meq/L (ref 135–145)

## 2013-09-16 LAB — LIPID PANEL
CHOLESTEROL: 103 mg/dL (ref 0–200)
HDL: 33.1 mg/dL — ABNORMAL LOW (ref >39.00)
LDL CALC: 57 mg/dL (ref 0–99)
NonHDL: 69.9
TRIGLYCERIDES: 64 mg/dL (ref 0.0–149.0)
Total CHOL/HDL Ratio: 3
VLDL: 12.8 mg/dL (ref 0.0–40.0)

## 2013-09-16 LAB — HEMOGLOBIN A1C: HEMOGLOBIN A1C: 6.9 % — AB (ref 4.6–6.5)

## 2013-09-16 NOTE — Assessment & Plan Note (Signed)
Chronic w/PVD, CA Continue with current prescription therapy as reflected on the Med list. Labs

## 2013-09-16 NOTE — Progress Notes (Signed)
Pre visit review using our clinic review tool, if applicable. No additional management support is needed unless otherwise documented below in the visit note. 

## 2013-09-16 NOTE — Assessment & Plan Note (Signed)
Chronic BP is nl at home Continue with current prescription therapy as reflected on the Med list.

## 2013-09-16 NOTE — Assessment & Plan Note (Signed)
Continue with current prescription therapy as reflected on the Med list.  

## 2013-09-16 NOTE — Assessment & Plan Note (Signed)
No relapses

## 2013-09-16 NOTE — Progress Notes (Signed)
   Subjective:    HPI  The patient presents for a follow-up of  chronic hypertension, chronic dyslipidemia, type 2 diabetes controlled with medicines  SBP 100-107 at home F/u B feet swelling - resolved    Review of Systems  Constitutional: Negative for appetite change, fatigue and unexpected weight change.  HENT: Negative for congestion, nosebleeds, sneezing, sore throat and trouble swallowing.   Eyes: Negative for itching and visual disturbance.  Cardiovascular: Negative for chest pain, palpitations and leg swelling.  Gastrointestinal: Negative for nausea, blood in stool and abdominal distention.  Genitourinary: Negative for frequency and hematuria.  Musculoskeletal: Negative for back pain, gait problem, joint swelling and neck pain.  Skin: Negative for rash.  Neurological: Negative for dizziness, tremors, speech difficulty and weakness.  Psychiatric/Behavioral: Negative for sleep disturbance, dysphoric mood and agitation. The patient is not nervous/anxious.    BP Readings from Last 3 Encounters:  09/16/13 158/90  06/16/13 158/72  05/05/13 140/76   Wt Readings from Last 3 Encounters:  09/16/13 201 lb (91.173 kg)  06/16/13 200 lb (90.719 kg)  05/05/13 201 lb (91.173 kg)        Objective:   Physical Exam  Constitutional: He is oriented to person, place, and time. He appears well-developed.  Eyes: Conjunctivae are normal. Pupils are equal, round, and reactive to light.  Neck: Normal range of motion. No JVD present. No thyromegaly present.  Cardiovascular: Normal rate, regular rhythm, normal heart sounds and intact distal pulses.  Exam reveals no gallop and no friction rub.   No murmur heard. Pulmonary/Chest: Effort normal and breath sounds normal. No respiratory distress. He has no wheezes. He has no rales. He exhibits no tenderness.  Abdominal: Soft. Bowel sounds are normal. He exhibits no distension and no mass. There is no tenderness. There is no rebound and no  guarding.  Musculoskeletal: Normal range of motion. He exhibits no edema and no tenderness.  Lymphadenopathy:    He has no cervical adenopathy.  Neurological: He is alert and oriented to person, place, and time. He has normal reflexes. No cranial nerve deficit. He exhibits normal muscle tone. Coordination normal.  Skin: Skin is warm and dry. No rash noted.  Psychiatric: He has a normal mood and affect. His behavior is normal. Judgment and thought content normal.  B lat ankles are with no edema   Lab Results  Component Value Date   WBC 5.4 06/29/2011   HGB 11.8* 06/29/2011   HCT 36.5* 06/29/2011   PLT 155.0 06/29/2011   GLUCOSE 173* 03/16/2013   CHOL 129 03/15/2011   TRIG 106.0 03/15/2011   HDL 35.10* 03/15/2011   LDLCALC 73 03/15/2011   ALT 18 03/16/2013   AST 20 03/16/2013   NA 142 03/16/2013   K 3.6 03/16/2013   CL 107 03/16/2013   CREATININE 0.9 03/16/2013   BUN 10 03/16/2013   CO2 29 03/16/2013   TSH 0.91 10/08/2011   PSA 1.31 03/22/2010   HGBA1C 6.8* 03/16/2013   MICROALBUR 57.7* 03/22/2010      Assessment & Plan:

## 2013-10-12 ENCOUNTER — Encounter: Payer: Self-pay | Admitting: Internal Medicine

## 2013-10-30 ENCOUNTER — Other Ambulatory Visit: Payer: Self-pay | Admitting: Internal Medicine

## 2013-10-30 NOTE — Telephone Encounter (Signed)
Please advise refill? 

## 2013-11-09 ENCOUNTER — Other Ambulatory Visit: Payer: Self-pay | Admitting: Internal Medicine

## 2013-11-24 ENCOUNTER — Encounter: Payer: Self-pay | Admitting: Nurse Practitioner

## 2013-11-24 ENCOUNTER — Other Ambulatory Visit: Payer: Medicare Other

## 2013-11-24 ENCOUNTER — Ambulatory Visit (INDEPENDENT_AMBULATORY_CARE_PROVIDER_SITE_OTHER): Payer: Medicare Other | Admitting: Nurse Practitioner

## 2013-11-24 VITALS — BP 152/76 | HR 65 | Temp 98.4°F | Ht 73.0 in | Wt 201.0 lb

## 2013-11-24 DIAGNOSIS — Z0000 Encounter for general adult medical examination without abnormal findings: Secondary | ICD-10-CM

## 2013-11-24 DIAGNOSIS — E119 Type 2 diabetes mellitus without complications: Secondary | ICD-10-CM

## 2013-11-24 DIAGNOSIS — Z23 Encounter for immunization: Secondary | ICD-10-CM

## 2013-11-24 LAB — MICROALBUMIN / CREATININE URINE RATIO
CREATININE, U: 167.7 mg/dL
MICROALB UR: 38.7 mg/dL — AB (ref 0.0–1.9)
Microalb Creat Ratio: 23.1 mg/g (ref 0.0–30.0)

## 2013-11-24 NOTE — Progress Notes (Signed)
Subjective:    Harold Pittman is a 73 y.o. male who presents for Medicare Annual/Subsequent preventive examination.   Preventive Screening-Counseling & Management  Tobacco History  Smoking status  . Former Smoker  Smokeless tobacco  . Never Used    Comment: Stopped 1997    Problems Prior to Visit 1.   Current Problems (verified) Patient Active Problem List   Diagnosis Date Noted  . DM (diabetes mellitus), type 2 with peripheral vascular complications [id number]  . Gastroenteritis 06/16/2013  . Acute upper respiratory infections of unspecified site 03/25/2013  . Diarrhea 10/08/2012  . DOE (dyspnea on exertion) 06/29/2011  . Abscess of axilla, right 12/24/2010  . LATERAL EPICONDYLITIS, LEFT 02/20/2010  . CORONARY ATHEROSCLEROSIS NATIVE CORONARY ARTERY 07/11/2009  . HEMATOCHEZIA 07/02/2009  . PROCTITIS 02/23/2009  . TOBACCO USE, QUIT 02/23/2009  . Cough 02/16/2009  . DECREASED HEARING 10/15/2008  . ERECTILE DYSFUNCTION 04/30/2008  . CAD, ARTERY BYPASS GRAFT 04/30/2008  . HOARSENESS 08/25/2007  . 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 ANXIETY 05/20/2007  . INSOMNIA, PERSISTENT 03/04/2007  . HYPERLIPIDEMIA 02/01/2007  . GOUT 02/01/2007  . HYPERTENSION 02/01/2007  . GERD 02/01/2007  . OSTEOARTHRITIS 02/01/2007    Medications Prior to Visit Current Outpatient Prescriptions on File Prior to Visit  Medication Sig Dispense Refill  . amLODipine (NORVASC) 10 MG tablet TAKE ONE TABLET BY MOUTH ONCE DAILY  30 tablet  0  . aspirin 81 MG EC tablet Take 81 mg by mouth daily.        . benazepril (LOTENSIN) 40 MG tablet 1 tab po qd  90 tablet  3  . carvedilol (COREG) 25 MG tablet Take 1 tablet (25 mg total) by mouth 2 (two) times daily with a meal.  180 tablet  3  . Cholecalciferol 1000 UNITS tablet Take 2,000 Units by mouth daily.        . diphenoxylate-atropine (LOMOTIL) 2.5-0.025 MG per tablet Take 1 tablet by mouth 4 (four) times daily as needed for diarrhea or loose stools.  30 tablet   0  . furosemide (LASIX) 20 MG tablet TAKE ONE TABLET BY MOUTH IN THE MORNING  90 tablet  0  . hydrALAZINE (APRESOLINE) 50 MG tablet TAKE ONE TABLET BY MOUTH THREE TIMES DAILY  270 tablet  2  . indomethacin (INDOCIN) 25 MG capsule TAKE TWO CAPSULES BY MOUTH THREE TIMES DAILY AS NEEDED FOR  GOUT  ATTACKS  90 capsule  3  . loperamide (IMODIUM A-D) 2 MG tablet Take 1 tablet (2 mg total) by mouth 4 (four) times daily as needed for diarrhea or loose stools.  30 tablet  1  . metFORMIN (GLUCOPHAGE) 500 MG tablet TAKE ONE TABLET BY MOUTH TWICE DAILY  180 tablet  0  . omeprazole (PRILOSEC) 20 MG capsule TAKE ONE CAPSULE BY MOUTH ONCE DAILY  30 capsule  2  . pravastatin (PRAVACHOL) 80 MG tablet Take 1 tablet (80 mg total) by mouth at bedtime.  90 tablet  3  . promethazine (PHENERGAN) 25 MG tablet Take 1 tablet (25 mg total) by mouth every 8 (eight) hours as needed for nausea or vomiting.  30 tablet  0  . ranitidine (ZANTAC) 300 MG tablet TAKE ONE TABLET BY MOUTH ONCE DAILY IN THE EVENING  30 tablet  5  . tadalafil (CIALIS) 20 MG tablet Take as directed  10 tablet  5  . zolpidem (AMBIEN) 5 MG tablet Take 1 tablet (5 mg total) by mouth at bedtime as needed for sleep.  30 tablet  5  . albuterol (PROAIR HFA) 108 (90 BASE) MCG/ACT inhaler Inhale 2 puffs into the lungs 2 (two) times daily.  1 Inhaler  11  . beclomethasone (QVAR) 80 MCG/ACT inhaler Inhale 2 puffs into the lungs 2 (two) times daily.  1 Inhaler  11  . HYDROcodone-homatropine (HYCODAN) 5-1.5 MG/5ML syrup Take 5 mLs by mouth every 6 (six) hours as needed for cough.  180 mL  0  . promethazine-codeine (PHENERGAN WITH CODEINE) 6.25-10 MG/5ML syrup Take 5-10 mLs by mouth 4 (four) times daily as needed. For cough        No current facility-administered medications on file prior to visit.    Current Medications (verified) Current Outpatient Prescriptions  Medication Sig Dispense Refill  . amLODipine (NORVASC) 10 MG tablet TAKE ONE TABLET BY MOUTH ONCE  DAILY  30 tablet  0  . aspirin 81 MG EC tablet Take 81 mg by mouth daily.        . benazepril (LOTENSIN) 40 MG tablet 1 tab po qd  90 tablet  3  . carvedilol (COREG) 25 MG tablet Take 1 tablet (25 mg total) by mouth 2 (two) times daily with a meal.  180 tablet  3  . Cholecalciferol 1000 UNITS tablet Take 2,000 Units by mouth daily.        . diphenoxylate-atropine (LOMOTIL) 2.5-0.025 MG per tablet Take 1 tablet by mouth 4 (four) times daily as needed for diarrhea or loose stools.  30 tablet  0  . furosemide (LASIX) 20 MG tablet TAKE ONE TABLET BY MOUTH IN THE MORNING  90 tablet  0  . hydrALAZINE (APRESOLINE) 50 MG tablet TAKE ONE TABLET BY MOUTH THREE TIMES DAILY  270 tablet  2  . indomethacin (INDOCIN) 25 MG capsule TAKE TWO CAPSULES BY MOUTH THREE TIMES DAILY AS NEEDED FOR  GOUT  ATTACKS  90 capsule  3  . loperamide (IMODIUM A-D) 2 MG tablet Take 1 tablet (2 mg total) by mouth 4 (four) times daily as needed for diarrhea or loose stools.  30 tablet  1  . metFORMIN (GLUCOPHAGE) 500 MG tablet TAKE ONE TABLET BY MOUTH TWICE DAILY  180 tablet  0  . omeprazole (PRILOSEC) 20 MG capsule TAKE ONE CAPSULE BY MOUTH ONCE DAILY  30 capsule  2  . pravastatin (PRAVACHOL) 80 MG tablet Take 1 tablet (80 mg total) by mouth at bedtime.  90 tablet  3  . promethazine (PHENERGAN) 25 MG tablet Take 1 tablet (25 mg total) by mouth every 8 (eight) hours as needed for nausea or vomiting.  30 tablet  0  . ranitidine (ZANTAC) 300 MG tablet TAKE ONE TABLET BY MOUTH ONCE DAILY IN THE EVENING  30 tablet  5  . tadalafil (CIALIS) 20 MG tablet Take as directed  10 tablet  5  . zolpidem (AMBIEN) 5 MG tablet Take 1 tablet (5 mg total) by mouth at bedtime as needed for sleep.  30 tablet  5  . albuterol (PROAIR HFA) 108 (90 BASE) MCG/ACT inhaler Inhale 2 puffs into the lungs 2 (two) times daily.  1 Inhaler  11  . beclomethasone (QVAR) 80 MCG/ACT inhaler Inhale 2 puffs into the lungs 2 (two) times daily.  1 Inhaler  11  .  HYDROcodone-homatropine (HYCODAN) 5-1.5 MG/5ML syrup Take 5 mLs by mouth every 6 (six) hours as needed for cough.  180 mL  0  . promethazine-codeine (PHENERGAN WITH CODEINE) 6.25-10 MG/5ML syrup Take 5-10 mLs by mouth 4 (four) times daily as needed. For  cough        No current facility-administered medications for this visit.     Allergies (verified) Review of patient's allergies indicates no known allergies.   PAST HISTORY  Family History Family History  Problem Relation Age of Onset  . Depression Sister   . Heart disease Sister   . Colon cancer Neg Hx   . Hypertension Other   . Coronary artery disease Other     Male 1st degree relative <50  . Heart disease Mother 45    CAD  . Mental illness Father     Alzheimer's    Social History History  Substance Use Topics  . Smoking status: Former Research scientist (life sciences)  . Smokeless tobacco: Never Used     Comment: Stopped 1997  . Alcohol Use: No     Comment: Stopped 1997    Are there smokers in your home (other than you)?  No  Risk Factors Current exercise habits: The patient does not participate in regular exercise at present.  Dietary issues discussed: eats a well balanced diet   Cardiac risk factors: advanced age (older than 68 for men, 62 for women), diabetes mellitus, dyslipidemia, hypertension, male gender, sedentary lifestyle and smoking/ tobacco exposure.  Depression Screen (Note: if answer to either of the following is "Yes", a more complete depression screening is indicated)   Q1: Over the past two weeks, have you felt down, depressed or hopeless? Yes  Q2: Over the past two weeks, have you felt little interest or pleasure in doing things? No  Have you lost interest or pleasure in daily life? No  Do you often feel hopeless? No  Do you cry easily over simple problems? No  Activities of Daily Living In your present state of health, do you have any difficulty performing the following activities?:  Driving? No Managing money?   No Feeding yourself? No Getting from bed to chair? No Climbing a flight of stairs? Yes Preparing food and eating?: No Bathing or showering? No Getting dressed: No Getting to the toilet? No Using the toilet:No Moving around from place to place: No In the past year have you fallen or had a near fall?:Yes   Are you sexually active?  No  Do you have more than one partner?  No  Hearing Difficulties: Yes Do you often ask people to speak up or repeat themselves? Yes Do you experience ringing or noises in your ears? Yes Do you have difficulty understanding soft or whispered voices? Yes   Do you feel that you have a problem with memory? No  Do you often misplace items? Yes  Do you feel safe at home?  Yes  Cognitive Testing  Alert? Yes  Normal Appearance?Yes  Oriented to person? Yes  Place? Yes   Time? Yes  Recall of three objects?  No  Can perform simple calculations? No  Displays appropriate judgment?Yes  Can read the correct time from a watch face?Yes   Advanced Directives have been discussed with the patient? Yes   List the Names of Other Physician/Practitioners you currently use: 1.  Patient Care Team: Cassandria Anger, MD as PCP - General Sherren Mocha, MD (Cardiology)   Indicate any recent Medical Services you may have received from other than [facility] providers in the past year (date may be approximate).  Immunization History  Administered Date(s) Administered  . Influenza Split 03/15/2011, 02/08/2012  . Influenza Whole 03/04/2006, 03/04/2007, 03/15/2009, 12/14/2009  . Influenza, High Dose Seasonal PF 03/16/2013  . Pneumococcal Conjugate-13 09/16/2013  .  Pneumococcal Polysaccharide-23 12/20/2004, 03/15/2011    Screening Tests Health Maintenance  Topic Date Due  . Ophthalmology Exam  04/14/1951  . Tetanus/tdap  04/13/1960  . Zostavax  04/13/2001  . Foot Exam  02/16/2010  . Urine Microalbumin  03/23/2011  . Influenza Vaccine  11/07/2013  . Hemoglobin A1c   03/18/2014  . Colonoscopy  02/23/2018  . Pneumococcal Polysaccharide Vaccine Age 43 And Over  Completed    All answers were reviewed with the patient and necessary referrals were made:  Carmelina Paddock, NP   11/24/2013   History reviewed: allergies, current medications, past family history, past medical history, past social history, past surgical history and problem list  Review of Systems Patient will follow up with Dr. Alain Marion Sept 2015    Objective:     Vision by Snellen chart: right eye:20/20, left [id number] uses reading glasses. referred to Opthalmologist for evaluation diabetic check Blood pressure 160/80, pulse 65, temperature 98.4 F (36.9 C), temperature source Oral, height 6\' 1"  (1.854 m), weight 201 lb (91.173 kg), SpO2 96.00%. Body mass index is 26.52 kg/(m^2).  No exam performed today, not indicated today. Bp rechecked 152/76.  Patient has not taken his medications this am   Diabetic foot exam performed  Urine microalbumin performed Patient declines Zostavax presently Influenza Vaccine not available today. Patient to check BP intermittently.    Assessment:        Patient presents for yearly preventative medicine examination. Medicare questionnaire was completed  All immunizations and health maintenance protocols were reviewed with the patient and needed orders were placed.  Appropriate screening laboratory values were ordered for the patient including screening of hyperlipidemia, renal function and hepatic function. If indicated by BPH, a PSA was ordered. Patient given diabetic foot exam today. Negative for neuropathy.  Medication reconciliation,  past medical history, social history, problem list and allergies were reviewed in detail with the patient  Goals were established with regard to weight loss, exercise, and  diet in compliance with medications  End of life planning was discussed.     Plan:     During the course of the visit the patient  was educated and counseled about appropriate screening and preventive services including:    Pneumococcal vaccine   Influenza vaccine  Td vaccine  Diabetes screening  Nutrition counseling   Advanced directives: has NO advanced directive  - add't info requested. Referral to SW: no  Diet review for nutrition referral? Yes ____  Not Indicated _no___   Patient Instructions (the written plan) was given to the patient.  Patient to apply warm compress  To Tetanus vaccine injection site.  Declines Zostavax.  Patient to follow up with Dr. Alain Marion in September. Call clinic with questions or conccerns  Medicare Attestation I have personally reviewed: The patient's medical and social history Their use of alcohol, tobacco or illicit drugs Their current medications and supplements The patient's functional ability including ADLs,fall risks, home safety risks, cognitive, and hearing and visual impairment Diet and physical activities Evidence for depression or mood disorders  The patient's weight, height, BMI, and visual acuity have been recorded in the chart.  I have made referrals, counseling, and provided education to the patient based on review of the above and I have provided the patient with a written personalized care plan for preventive services.     Steva Colder A, NP   11/24/2013

## 2013-11-24 NOTE — Progress Notes (Signed)
Pre visit review using our clinic review tool, if applicable. No additional management support is needed unless otherwise documented below in the visit note. 

## 2013-11-24 NOTE — Patient Instructions (Signed)
Advance Directive Advance directives are the legal documents that allow you to make choices about your health care and medical treatment if you cannot speak for yourself. Advance directives are a way for you to communicate your wishes to family, friends, and health care providers. The specified people can then convey your decisions about end-of-life care to avoid confusion if you should become unable to communicate. Ideally, the process of discussing and writing advance directives should happen over time rather than making decisions all at once. Advance directives can be modified as your situation changes, and you can change your mind at any time, even after you have signed the advance directives. Each state has its own laws regarding advance directives. You may want to check with your health care provider, attorney, or state representative about the law in your state. Below are some examples of advance directives. LIVING WILL A living will is a set of instructions documenting your wishes about medical care when you cannot care for yourself. It is used if you become:  Terminally ill.  Incapacitated.  Unable to communicate.  Unable to make decisions. Items to consider in your living will include:  The use or non-use of life-sustaining equipment, such as dialysis machines and breathing machines (ventilators).  A do not resuscitate (DNR) order, which is the instruction not to use cardiopulmonary resuscitation (CPR) if breathing or heartbeat stops.  Tube feeding.  Withholding of food and fluids.  Comfort (palliative) care when the goal becomes comfort rather than a cure.  Organ and tissue donation. A living will does not give instructions about distribution of your money and property if you should pass away. It is advisable to seek the expert advice of a lawyer in drawing up a will regarding your possessions. Decisions about taxes, beneficiaries, and asset distribution will be legally binding.  This process can relieve your family and friends of any burdens surrounding disputes or questions that may come up about the allocation of your assets. DO NOT RESUSCITATE (DNR) A do not resuscitate (DNR) order is a request to not have CPR in the event that your heart stops beating or you stop breathing. Unless given other instructions, a health care provider will try to help any patient whose heart has stopped or who has stopped breathing.  HEALTH CARE PROXY AND DURABLE POWER OF ATTORNEY FOR HEALTH CARE A health care proxy is a person (agent) appointed to make medical decisions for you if you cannot. Generally, people choose someone they know well and trust to represent their preferences when they can no longer do so. You should be sure to ask this person for agreement to act as your agent. An agent may have to exercise judgment in the event of a medical decision for which your wishes are not known. The durable power of attorney for health care is the legal document that names your health care proxy. Once written, it should be:  Signed.  Notarized.  Dated.  Copied.  Witnessed.  Incorporated into your medical record. You may also want to appoint someone to manage your financial affairs if you cannot. This is called a durable power of attorney for finances. It is a separate legal document from the durable power of attorney for health care. You may choose the same person or someone different from your health care proxy to act as your agent in financial matters. Document Released: 07/03/2007 Document Revised: 03/31/2013 Document Reviewed: 08/13/2012 Exit[employer] Patient Information 2015 [employer]. This information is not intended to replace advice   to you by your health care provider. Make sure you discuss any questions you have with your health care provider.  

## 2013-11-30 ENCOUNTER — Other Ambulatory Visit: Payer: Self-pay | Admitting: Internal Medicine

## 2013-12-16 ENCOUNTER — Other Ambulatory Visit: Payer: Self-pay | Admitting: Internal Medicine

## 2013-12-17 ENCOUNTER — Ambulatory Visit (INDEPENDENT_AMBULATORY_CARE_PROVIDER_SITE_OTHER): Payer: Medicare Other | Admitting: Internal Medicine

## 2013-12-17 ENCOUNTER — Other Ambulatory Visit (INDEPENDENT_AMBULATORY_CARE_PROVIDER_SITE_OTHER): Payer: Medicare Other

## 2013-12-17 ENCOUNTER — Encounter: Payer: Self-pay | Admitting: Internal Medicine

## 2013-12-17 VITALS — BP 140/80 | HR 68 | Temp 98.3°F | Resp 16 | Wt 202.0 lb

## 2013-12-17 DIAGNOSIS — I2510 Atherosclerotic heart disease of native coronary artery without angina pectoris: Secondary | ICD-10-CM

## 2013-12-17 DIAGNOSIS — Z86010 Personal history of colonic polyps: Secondary | ICD-10-CM

## 2013-12-17 DIAGNOSIS — E1151 Type 2 diabetes mellitus with diabetic peripheral angiopathy without gangrene: Secondary | ICD-10-CM

## 2013-12-17 DIAGNOSIS — I798 Other disorders of arteries, arterioles and capillaries in diseases classified elsewhere: Secondary | ICD-10-CM

## 2013-12-17 DIAGNOSIS — E1159 Type 2 diabetes mellitus with other circulatory complications: Secondary | ICD-10-CM

## 2013-12-17 DIAGNOSIS — Z23 Encounter for immunization: Secondary | ICD-10-CM

## 2013-12-17 DIAGNOSIS — I10 Essential (primary) hypertension: Secondary | ICD-10-CM

## 2013-12-17 DIAGNOSIS — M109 Gout, unspecified: Secondary | ICD-10-CM

## 2013-12-17 LAB — BASIC METABOLIC PANEL
BUN: 9 mg/dL (ref 6–23)
CO2: 29 mEq/L (ref 19–32)
CREATININE: 1.1 mg/dL (ref 0.4–1.5)
Calcium: 9.2 mg/dL (ref 8.4–10.5)
Chloride: 104 mEq/L (ref 96–112)
GFR: 87.2 mL/min (ref >60.00)
Glucose, Bld: 181 mg/dL — ABNORMAL HIGH (ref 70–99)
Potassium: 4.2 mEq/L (ref 3.5–5.1)
Sodium: 141 mEq/L (ref 135–145)

## 2013-12-17 LAB — HEMOGLOBIN A1C: HEMOGLOBIN A1C: 7.1 % — AB (ref 4.6–6.5)

## 2013-12-17 NOTE — Assessment & Plan Note (Signed)
Continue with current prescription therapy as reflected on the Med list.  

## 2013-12-17 NOTE — Assessment & Plan Note (Signed)
Doing well 

## 2013-12-17 NOTE — Assessment & Plan Note (Signed)
Dr Deatra Ina. Due colon 2019

## 2013-12-17 NOTE — Progress Notes (Signed)
Patient ID: Harold Pittman, male   DOB: 1940-11-02, 73 y.o.   MRN: [id number]   Subjective:    HPI  The patient presents for a follow-up of  chronic hypertension, chronic dyslipidemia, type 2 diabetes controlled with medicines  SBP 100-110 at home F/u B feet swelling - resolved    Review of Systems  Constitutional: Negative for appetite change, fatigue and unexpected weight change.  HENT: Negative for congestion, nosebleeds, sneezing, sore throat and trouble swallowing.   Eyes: Negative for itching and visual disturbance.  Cardiovascular: Negative for chest pain, palpitations and leg swelling.  Gastrointestinal: Negative for nausea, blood in stool and abdominal distention.  Genitourinary: Negative for frequency and hematuria.  Musculoskeletal: Negative for back pain, gait problem, joint swelling and neck pain.  Skin: Negative for rash.  Neurological: Negative for dizziness, tremors, speech difficulty and weakness.  Psychiatric/Behavioral: Negative for sleep disturbance, dysphoric mood and agitation. The patient is not nervous/anxious.    BP Readings from Last 3 Encounters:  12/17/13 140/80  11/25/13 152/76  09/16/13 158/90   Wt Readings from Last 3 Encounters:  12/17/13 202 lb (91.627 kg)  11/24/13 201 lb (91.173 kg)  09/16/13 201 lb (91.173 kg)        Objective:   Physical Exam  Constitutional: He is oriented to person, place, and time. He appears well-developed.  Eyes: Conjunctivae are normal. Pupils are equal, round, and reactive to light.  Neck: Normal range of motion. No JVD present. No thyromegaly present.  Cardiovascular: Normal rate, regular rhythm, normal heart sounds and intact distal pulses.  Exam reveals no gallop and no friction rub.   No murmur heard. Pulmonary/Chest: Effort normal and breath sounds normal. No respiratory distress. He has no wheezes. He has no rales. He exhibits no tenderness.  Abdominal: Soft. Bowel sounds are normal. He exhibits no  distension and no mass. There is no tenderness. There is no rebound and no guarding.  Musculoskeletal: Normal range of motion. He exhibits no edema and no tenderness.  Lymphadenopathy:    He has no cervical adenopathy.  Neurological: He is alert and oriented to person, place, and time. He has normal reflexes. No cranial nerve deficit. He exhibits normal muscle tone. Coordination normal.  Skin: Skin is warm and dry. No rash noted.  Psychiatric: He has a normal mood and affect. His behavior is normal. Judgment and thought content normal.  B lat ankles are with no edema   Lab Results  Component Value Date   WBC 5.4 06/29/2011   HGB 11.8* 06/29/2011   HCT 36.5* 06/29/2011   PLT 155.0 06/29/2011   GLUCOSE 148* 09/16/2013   CHOL 103 09/16/2013   TRIG 64.0 09/16/2013   HDL 33.10* 09/16/2013   LDLCALC 57 09/16/2013   ALT 18 03/16/2013   AST 20 03/16/2013   NA 143 09/16/2013   K 4.7 09/16/2013   CL 107 09/16/2013   CREATININE 1.0 09/16/2013   BUN 10 09/16/2013   CO2 29 09/16/2013   TSH 0.91 10/08/2011   PSA 1.31 03/22/2010   HGBA1C 6.9* 09/16/2013   MICROALBUR 38.7* 11/24/2013      Assessment & Plan:

## 2013-12-17 NOTE — Assessment & Plan Note (Signed)
Labs  Continue with current prescription therapy as reflected on the Med list.  

## 2013-12-17 NOTE — Progress Notes (Signed)
Pre visit review using our clinic review tool, if applicable. No additional management support is needed unless otherwise documented below in the visit note. 

## 2013-12-17 NOTE — Assessment & Plan Note (Signed)
Continue with current prescription therapy as reflected on the Med list. Labs  

## 2014-01-12 ENCOUNTER — Ambulatory Visit (INDEPENDENT_AMBULATORY_CARE_PROVIDER_SITE_OTHER)
Admission: RE | Admit: 2014-01-12 | Discharge: 2014-01-12 | Disposition: A | Discharge status: Home / Self Care | Payer: Medicare Other | Source: Ambulatory Visit | Attending: Family Medicine | Admitting: Family Medicine

## 2014-01-12 ENCOUNTER — Ambulatory Visit (INDEPENDENT_AMBULATORY_CARE_PROVIDER_SITE_OTHER): Payer: Medicare Other | Admitting: Family Medicine

## 2014-01-12 ENCOUNTER — Encounter: Payer: Self-pay | Admitting: Family Medicine

## 2014-01-12 VITALS — BP 136/80 | HR 76 | Ht 73.0 in | Wt 202.0 lb

## 2014-01-12 DIAGNOSIS — M545 Low back pain, unspecified: Secondary | ICD-10-CM

## 2014-01-12 DIAGNOSIS — M5416 Radiculopathy, lumbar region: Secondary | ICD-10-CM

## 2014-01-12 MED ORDER — MELOXICAM 15 MG PO TABS: 15.0000 mg | ORAL_TABLET | Freq: Every day | ORAL | Status: DC

## 2014-01-12 MED ORDER — KETOROLAC TROMETHAMINE 60 MG/2ML IM SOLN
60.0000 mg | Freq: Once | INTRAMUSCULAR | Status: AC
Start: 2014-01-12 — End: 2014-01-12
  Administered 2014-01-12: 60 mg via INTRAMUSCULAR

## 2014-01-12 MED ORDER — METHYLPREDNISOLONE ACETATE 80 MG/ML IJ SUSP
80.0000 mg | Freq: Once | INTRAMUSCULAR | Status: AC
  Administered 2014-01-12: 80 mg via INTRAMUSCULAR

## 2014-01-12 NOTE — Assessment & Plan Note (Signed)
Patient's low back pain seems to be muscular in nature. Patient was given a shot of portal as well as Depo-Medrol today. Patient will do a short course of meloxicam and was given home exercise program as well as an icing protocol. Patient will do these interventions as well as we discussed over-the-counter medications that could be beneficial. Patient is come back up in one week for further evaluation. X-rays ordered. On the patient continues to improve we'll continue with conservative therapy otherwise further workup including potential imaging and formal physical therapy may be needed. Patient is to come back if any radicular symptoms occur.

## 2014-01-12 NOTE — Patient Instructions (Addendum)
Nice to meet you Ice 20 minutes 2 times daily. Usually after activity and before bed. Exercises 3 times a week.  Meloixcam daily for 10 days then stop Stop indomethacin while on meloxicam  VItamin D 2000 IU daily.  Turmeric 500mg  twice daily.  Centrum would be great.  Xrays downstairs today.  See me again in 1 week.

## 2014-01-12 NOTE — Progress Notes (Signed)
  [facility] [address], [place] [address] Phone: [phone] Subjective:    I'm seeing this patient by the request  of:  Walker Kehr, MD   CC: back and hip pain.   [id number] Harold Pittman is a 73 y.o. male coming in with complaint of back and hip pain. Patient states most of the pain is on the lower back. States that it is on the left side but also the right side. Has been there for approximately 2-3 days. At some mild radiation into his hip for the first day but seems to have resolved since then. Patient is a severity pain is 6/10. Denies any radiation down the leg or any numbness or tingling. Denies any fevers or chills or any abnormal weight loss. Patient still able to do daily activities but has some discomfort. Patient likes to work in the yard but is unable to do so secondary to pain. Denies any nighttime awakening. Patient does not remember any inciting event. Patient has tried heat at home as well as Tylenol with mild improvement. Patient does take indomethacin fairly regularly but has not noticed any improvement.     Past medical history, social, surgical and family history all reviewed in electronic medical record.   Review of Systems: No headache, visual changes, nausea, vomiting, diarrhea, constipation, dizziness, abdominal pain, skin rash, fevers, chills, night sweats, weight loss, swollen lymph nodes, body aches, joint swelling, muscle aches, chest pain, shortness of breath, mood changes.   Objective Blood pressure 136/80, pulse 76, height 6\' 1"  (1.854 m), weight 202 lb (91.627 kg), SpO2 95.00%.  General: No apparent distress alert and oriented x3 mood and affect normal, dressed appropriately.  HEENT: Pupils equal, extraocular movements intact  Respiratory: Patient's speak in full sentences and does not appear short of breath  Cardiovascular: No lower extremity edema, non tender, no erythema  Skin: Warm dry intact with no signs  of infection or rash on extremities or on axial skeleton.  Abdomen: Soft nontender  Neuro: Cranial nerves II through XII are intact, neurovascularly intact in all extremities with 2+ DTRs and 2+ pulses.  Lymph: No lymphadenopathy of posterior or anterior cervical chain or axillae bilaterally.  Gait normal with good balance and coordination.  MSK:  Non tender with full range of motion and good stability and symmetric strength and tone of shoulders, elbows, wrist, hip, knee and ankles bilaterally.  Back Exam:  Inspection: Unremarkable  Motion: Flexion 35 deg, Extension 30 deg, Side Bending to 35 deg bilaterally,  Rotation to 35 deg bilaterally  SLR laying: Negative  XSLR laying: Negative  Palpable tenderness: Tender to palpation over the paraspinal musculature on the left side of the lumbar spine. Muscle spasm noted. FABER: negative. Sensory change: Gross sensation intact to all lumbar and sacral dermatomes.  Reflexes: 2+ at both patellar tendons, 2+ at achilles tendons, Babinski's downgoing.  Strength at foot  Plantar-flexion: 5/5 Dorsi-flexion: 5/5 Eversion: 5/5 Inversion: 5/5  Leg strength  Quad: 5/5 Hamstring: 5/5 Hip flexor: 5/5 Hip abductors: 4/5  Gait unremarkable.    Impression and Recommendations:     This case required medical decision making of moderate complexity.

## 2014-01-20 ENCOUNTER — Ambulatory Visit: Payer: Medicare Other | Admitting: Family Medicine

## 2014-01-20 ENCOUNTER — Other Ambulatory Visit: Payer: Self-pay | Admitting: Internal Medicine

## 2014-01-20 DIAGNOSIS — Z0289 Encounter for other administrative examinations: Secondary | ICD-10-CM

## 2014-03-18 ENCOUNTER — Other Ambulatory Visit (INDEPENDENT_AMBULATORY_CARE_PROVIDER_SITE_OTHER): Payer: Medicare Other

## 2014-03-18 ENCOUNTER — Other Ambulatory Visit: Payer: Self-pay | Admitting: Internal Medicine

## 2014-03-18 ENCOUNTER — Encounter: Payer: Self-pay | Admitting: Internal Medicine

## 2014-03-18 ENCOUNTER — Ambulatory Visit (INDEPENDENT_AMBULATORY_CARE_PROVIDER_SITE_OTHER): Payer: Medicare Other | Admitting: Internal Medicine

## 2014-03-18 VITALS — BP 160/84 | HR 69 | Temp 98.5°F | Wt 204.0 lb

## 2014-03-18 DIAGNOSIS — I10 Essential (primary) hypertension: Secondary | ICD-10-CM

## 2014-03-18 DIAGNOSIS — M109 Gout, unspecified: Secondary | ICD-10-CM

## 2014-03-18 DIAGNOSIS — E785 Hyperlipidemia, unspecified: Secondary | ICD-10-CM | POA: Diagnosis not present

## 2014-03-18 DIAGNOSIS — E1159 Type 2 diabetes mellitus with other circulatory complications: Secondary | ICD-10-CM

## 2014-03-18 DIAGNOSIS — I2510 Atherosclerotic heart disease of native coronary artery without angina pectoris: Secondary | ICD-10-CM

## 2014-03-18 DIAGNOSIS — R202 Paresthesia of skin: Secondary | ICD-10-CM

## 2014-03-18 DIAGNOSIS — Z79899 Other long term (current) drug therapy: Secondary | ICD-10-CM

## 2014-03-18 DIAGNOSIS — R269 Unspecified abnormalities of gait and mobility: Secondary | ICD-10-CM | POA: Insufficient documentation

## 2014-03-18 LAB — VITAMIN B12: Vitamin B-12: 292 pg/mL (ref 211–911)

## 2014-03-18 LAB — HEPATIC FUNCTION PANEL
ALT: 16 U/L (ref 0–53)
AST: 20 U/L (ref 0–37)
Albumin: 3.9 g/dL (ref 3.5–5.2)
Alkaline Phosphatase: 57 U/L (ref 39–117)
BILIRUBIN DIRECT: 0.1 mg/dL (ref 0.0–0.3)
BILIRUBIN TOTAL: 0.6 mg/dL (ref 0.2–1.2)
Total Protein: 7 g/dL (ref 6.0–8.3)

## 2014-03-18 LAB — LIPID PANEL
CHOL/HDL RATIO: 3
Cholesterol: 106 mg/dL (ref 0–200)
HDL: 35.4 mg/dL — ABNORMAL LOW (ref >39.00)
LDL Cholesterol: 50 mg/dL (ref 0–99)
NonHDL: 70.6
Triglycerides: 101 mg/dL (ref 0.0–149.0)
VLDL: 20.2 mg/dL (ref 0.0–40.0)

## 2014-03-18 LAB — BASIC METABOLIC PANEL
BUN: 10 mg/dL (ref 6–23)
CHLORIDE: 104 meq/L (ref 96–112)
CO2: 28 meq/L (ref 19–32)
CREATININE: 1 mg/dL (ref 0.4–1.5)
Calcium: 8.8 mg/dL (ref 8.4–10.5)
GFR: 97.59 mL/min (ref >60.00)
GLUCOSE: 217 mg/dL — AB (ref 70–99)
POTASSIUM: 4.1 meq/L (ref 3.5–5.1)
Sodium: 139 mEq/L (ref 135–145)

## 2014-03-18 LAB — HEMOGLOBIN A1C: Hgb A1c MFr Bld: 7.3 % — ABNORMAL HIGH (ref 4.6–6.5)

## 2014-03-18 MED ORDER — DIPHENOXYLATE-ATROPINE 2.5-0.025 MG PO TABS: 1.0000 | ORAL_TABLET | Freq: Four times a day (QID) | ORAL | Status: DC | PRN

## 2014-03-18 MED ORDER — LOPERAMIDE HCL 2 MG PO TABS: 2.0000 mg | ORAL_TABLET | Freq: Four times a day (QID) | ORAL | Status: DC | PRN

## 2014-03-18 MED ORDER — OMEPRAZOLE 20 MG PO CPDR: 20.0000 mg | DELAYED_RELEASE_CAPSULE | Freq: Every day | ORAL | Status: DC

## 2014-03-18 MED ORDER — VITAMIN B-12 1000 MCG SL SUBL: 1.0000 | SUBLINGUAL_TABLET | Freq: Every day | SUBLINGUAL | Status: DC

## 2014-03-18 NOTE — Assessment & Plan Note (Signed)
Continue with current prescription therapy as reflected on the Med list.  

## 2014-03-18 NOTE — Progress Notes (Signed)
Pre visit review using our clinic review tool, if applicable. No additional management support is needed unless otherwise documented below in the visit note. 

## 2014-03-18 NOTE — Progress Notes (Signed)
   Subjective:    HPI  The patient presents for a follow-up of  chronic hypertension, chronic dyslipidemia, type 2 diabetes controlled with medicines. Occ diarrhea - hot new. Seems to not to be triggered by milk C/o some balance issues - no ETOH, no falls...  SBP 100-110 at home F/u B feet swelling - resolved    Review of Systems  Constitutional: Negative for appetite change, fatigue and unexpected weight change.  HENT: Negative for congestion, nosebleeds, sneezing, sore throat and trouble swallowing.   Eyes: Negative for itching and visual disturbance.  Cardiovascular: Negative for chest pain, palpitations and leg swelling.  Gastrointestinal: Negative for nausea, blood in stool and abdominal distention.  Genitourinary: Negative for frequency and hematuria.  Musculoskeletal: Negative for back pain, joint swelling, gait problem and neck pain.  Skin: Negative for rash.  Neurological: Negative for dizziness, tremors, speech difficulty and weakness.  Psychiatric/Behavioral: Negative for sleep disturbance, dysphoric mood and agitation. The patient is not nervous/anxious.    BP Readings from Last 3 Encounters:  03/18/14 160/84  01/12/14 136/80  12/17/13 140/80   Wt Readings from Last 3 Encounters:  03/18/14 204 lb (92.534 kg)  01/12/14 202 lb (91.627 kg)  12/17/13 202 lb (91.627 kg)        Objective:   Physical Exam  Constitutional: He is oriented to person, place, and time. He appears well-developed. No distress.  NAD  HENT:  Mouth/Throat: Oropharynx is clear and moist.  Eyes: Conjunctivae are normal. Pupils are equal, round, and reactive to light.  Neck: Normal range of motion. No JVD present. No thyromegaly present.  Cardiovascular: Normal rate, regular rhythm, normal heart sounds and intact distal pulses.  Exam reveals no gallop and no friction rub.   No murmur heard. Pulmonary/Chest: Effort normal and breath sounds normal. No respiratory distress. He has no wheezes. He  has no rales. He exhibits no tenderness.  Abdominal: Soft. Bowel sounds are normal. He exhibits no distension and no mass. There is no tenderness. There is no rebound and no guarding.  Musculoskeletal: Normal range of motion. He exhibits no edema or tenderness.  Lymphadenopathy:    He has no cervical adenopathy.  Neurological: He is alert and oriented to person, place, and time. He has normal reflexes. No cranial nerve deficit. He exhibits normal muscle tone. He displays a negative Romberg sign. Coordination and gait normal.  No meningeal signs  Skin: Skin is warm and dry. No rash noted.  Psychiatric: He has a normal mood and affect. His behavior is normal. Judgment and thought content normal.  B lat ankles are with no edema Mild ataxia  Lab Results  Component Value Date   WBC 5.4 06/29/2011   HGB 11.8* 06/29/2011   HCT 36.5* 06/29/2011   PLT 155.0 06/29/2011   GLUCOSE 181* 12/17/2013   CHOL 103 09/16/2013   TRIG 64.0 09/16/2013   HDL 33.10* 09/16/2013   LDLCALC 57 09/16/2013   ALT 18 03/16/2013   AST 20 03/16/2013   NA 141 12/17/2013   K 4.2 12/17/2013   CL 104 12/17/2013   CREATININE 1.1 12/17/2013   BUN 9 12/17/2013   CO2 29 12/17/2013   TSH 0.91 10/08/2011   PSA 1.31 03/22/2010   HGBA1C 7.1* 12/17/2013   MICROALBUR 38.7* 11/24/2013      Assessment & Plan:

## 2014-03-18 NOTE — Assessment & Plan Note (Signed)
Chronic BP is nl at home Continue with current prescription therapy as reflected on the Med list.

## 2014-03-18 NOTE — Assessment & Plan Note (Signed)
Balance exercises Silver sneakers

## 2014-03-18 NOTE — Assessment & Plan Note (Signed)
No relapse Continue with current prescription therapy as reflected on the Med list.  

## 2014-04-05 ENCOUNTER — Other Ambulatory Visit: Payer: Self-pay | Admitting: Cardiovascular Disease

## 2014-04-12 ENCOUNTER — Other Ambulatory Visit: Payer: Self-pay

## 2014-04-12 MED ORDER — PRAVASTATIN SODIUM 80 MG PO TABS: 80.0000 mg | ORAL_TABLET | Freq: Every day | ORAL | Status: DC

## 2014-04-19 ENCOUNTER — Other Ambulatory Visit: Payer: Self-pay | Admitting: Internal Medicine

## 2014-05-26 ENCOUNTER — Other Ambulatory Visit: Payer: Self-pay | Admitting: Geriatric Medicine

## 2014-05-26 MED ORDER — AMLODIPINE BESYLATE 10 MG PO TABS: 10.0000 mg | ORAL_TABLET | Freq: Every day | ORAL | Status: DC

## 2014-05-26 MED ORDER — CARVEDILOL 25 MG PO TABS: 25.0000 mg | ORAL_TABLET | Freq: Two times a day (BID) | ORAL | Status: DC

## 2014-05-26 MED ORDER — RANITIDINE HCL 300 MG PO TABS: 300.0000 mg | ORAL_TABLET | Freq: Every evening | ORAL | Status: DC

## 2014-05-26 MED ORDER — HYDRALAZINE HCL 50 MG PO TABS: 50.0000 mg | ORAL_TABLET | Freq: Three times a day (TID) | ORAL | Status: DC

## 2014-05-26 MED ORDER — METFORMIN HCL 500 MG PO TABS: ORAL_TABLET | ORAL | Status: DC

## 2014-05-26 MED ORDER — OMEPRAZOLE 20 MG PO CPDR: 20.0000 mg | DELAYED_RELEASE_CAPSULE | Freq: Every day | ORAL | Status: DC

## 2014-05-26 MED ORDER — PRAVASTATIN SODIUM 80 MG PO TABS: 80.0000 mg | ORAL_TABLET | Freq: Every day | ORAL | Status: DC

## 2014-05-26 MED ORDER — LOPERAMIDE HCL 2 MG PO TABS: 2.0000 mg | ORAL_TABLET | Freq: Four times a day (QID) | ORAL | Status: DC | PRN

## 2014-05-26 MED ORDER — FUROSEMIDE 20 MG PO TABS: 20.0000 mg | ORAL_TABLET | Freq: Every morning | ORAL | Status: DC

## 2014-06-04 ENCOUNTER — Ambulatory Visit: Payer: Medicare Other | Admitting: Cardiovascular Disease

## 2014-06-08 ENCOUNTER — Ambulatory Visit (INDEPENDENT_AMBULATORY_CARE_PROVIDER_SITE_OTHER): Payer: Medicare Other | Admitting: Internal Medicine

## 2014-06-08 ENCOUNTER — Encounter: Payer: Self-pay | Admitting: Internal Medicine

## 2014-06-08 VITALS — BP 148/80 | HR 78 | Temp 98.0°F | Wt 202.8 lb

## 2014-06-08 DIAGNOSIS — R269 Unspecified abnormalities of gait and mobility: Secondary | ICD-10-CM

## 2014-06-08 DIAGNOSIS — I25810 Atherosclerosis of coronary artery bypass graft(s) without angina pectoris: Secondary | ICD-10-CM

## 2014-06-08 DIAGNOSIS — I10 Essential (primary) hypertension: Secondary | ICD-10-CM

## 2014-06-08 DIAGNOSIS — E1151 Type 2 diabetes mellitus with diabetic peripheral angiopathy without gangrene: Secondary | ICD-10-CM

## 2014-06-08 DIAGNOSIS — E1159 Type 2 diabetes mellitus with other circulatory complications: Secondary | ICD-10-CM | POA: Diagnosis not present

## 2014-06-08 NOTE — Progress Notes (Signed)
Pre visit review using our clinic review tool, if applicable. No additional management support is needed unless otherwise documented below in the visit note. 

## 2014-06-08 NOTE — Assessment & Plan Note (Signed)
On Metformin Rx Labs

## 2014-06-08 NOTE — Progress Notes (Signed)
   Subjective:    HPI  The patient presents for a follow-up of  chronic hypertension, chronic dyslipidemia, type 2 diabetes controlled with medicines. Occ diarrhea - hot new. Seems to not to be triggered by milk F/u some balance issues - no ETOH, no falls...   SBP 100-110 at home F/u B feet swelling - resolved    Review of Systems  Constitutional: Negative for appetite change, fatigue and unexpected weight change.  HENT: Negative for congestion, nosebleeds, sneezing, sore throat and trouble swallowing.   Eyes: Negative for itching and visual disturbance.  Cardiovascular: Negative for chest pain, palpitations and leg swelling.  Gastrointestinal: Negative for nausea, blood in stool and abdominal distention.  Genitourinary: Negative for frequency and hematuria.  Musculoskeletal: Negative for back pain, joint swelling, gait problem and neck pain.  Skin: Negative for rash.  Neurological: Negative for dizziness, tremors, speech difficulty and weakness.  Psychiatric/Behavioral: Negative for sleep disturbance, dysphoric mood and agitation. The patient is not nervous/anxious.    BP Readings from Last 3 Encounters:  06/08/14 148/80  03/18/14 160/84  01/12/14 136/80   Wt Readings from Last 3 Encounters:  06/08/14 202 lb 12 oz (91.967 kg)  03/18/14 204 lb (92.534 kg)  01/12/14 202 lb (91.627 kg)        Objective:   Physical Exam  Constitutional: He is oriented to person, place, and time. He appears well-developed. No distress.  NAD  HENT:  Mouth/Throat: Oropharynx is clear and moist.  Eyes: Conjunctivae are normal. Pupils are equal, round, and reactive to light.  Neck: Normal range of motion. No JVD present. No thyromegaly present.  Cardiovascular: Normal rate, regular rhythm, normal heart sounds and intact distal pulses.  Exam reveals no gallop and no friction rub.   No murmur heard. Pulmonary/Chest: Effort normal and breath sounds normal. No respiratory distress. He has no  wheezes. He has no rales. He exhibits no tenderness.  Abdominal: Soft. Bowel sounds are normal. He exhibits no distension and no mass. There is no tenderness. There is no rebound and no guarding.  Musculoskeletal: Normal range of motion. He exhibits no edema or tenderness.  Lymphadenopathy:    He has no cervical adenopathy.  Neurological: He is alert and oriented to person, place, and time. He has normal reflexes. No cranial nerve deficit. He exhibits normal muscle tone. He displays a negative Romberg sign. Coordination and gait normal.  No meningeal signs  Skin: Skin is warm and dry. No rash noted.  Psychiatric: He has a normal mood and affect. His behavior is normal. Judgment and thought content normal.  B lat ankles are with no edema Mild ataxia  Lab Results  Component Value Date   WBC 5.4 06/29/2011   HGB 11.8* 06/29/2011   HCT 36.5* 06/29/2011   PLT 155.0 06/29/2011   GLUCOSE 217* 03/18/2014   CHOL 106 03/18/2014   TRIG 101.0 03/18/2014   HDL 35.40* 03/18/2014   LDLCALC 50 03/18/2014   ALT 16 03/18/2014   AST 20 03/18/2014   NA 139 03/18/2014   K 4.1 03/18/2014   CL 104 03/18/2014   CREATININE 1.0 03/18/2014   BUN 10 03/18/2014   CO2 28 03/18/2014   TSH 0.91 10/08/2011   PSA 1.31 03/22/2010   HGBA1C 7.3* 03/18/2014   MICROALBUR 38.7* 11/24/2013      Assessment & Plan:

## 2014-06-08 NOTE — Assessment & Plan Note (Signed)
Lotensin, Coreg, Hydralazine Rx

## 2014-06-08 NOTE — Assessment & Plan Note (Signed)
ASA, Pravachol Rx No angina

## 2014-06-08 NOTE — Assessment & Plan Note (Signed)
Discussed - don't work on heights

## 2014-06-09 ENCOUNTER — Telehealth: Payer: Self-pay | Admitting: Internal Medicine

## 2014-06-09 NOTE — Telephone Encounter (Signed)
emmi mailed  °

## 2014-06-23 ENCOUNTER — Ambulatory Visit: Payer: Medicare Other | Admitting: Internal Medicine

## 2014-06-28 ENCOUNTER — Other Ambulatory Visit: Payer: Self-pay | Admitting: Internal Medicine

## 2014-07-19 ENCOUNTER — Ambulatory Visit (INDEPENDENT_AMBULATORY_CARE_PROVIDER_SITE_OTHER): Payer: Medicare Other | Admitting: Cardiovascular Disease

## 2014-07-19 ENCOUNTER — Encounter: Payer: Self-pay | Admitting: Cardiovascular Disease

## 2014-07-19 VITALS — BP 138/62 | HR 63 | Ht 74.0 in | Wt 200.4 lb

## 2014-07-19 DIAGNOSIS — E785 Hyperlipidemia, unspecified: Secondary | ICD-10-CM | POA: Diagnosis not present

## 2014-07-19 DIAGNOSIS — I10 Essential (primary) hypertension: Secondary | ICD-10-CM | POA: Diagnosis not present

## 2014-07-19 DIAGNOSIS — I25810 Atherosclerosis of coronary artery bypass graft(s) without angina pectoris: Secondary | ICD-10-CM | POA: Diagnosis not present

## 2014-07-19 NOTE — Patient Instructions (Signed)
Your physician wants you to follow-up in: 1 YEAR with Dr Cooper.  You will receive a reminder letter in the mail two months in advance. If you don't receive a letter, please call our office to schedule the follow-up appointment.  Your physician recommends that you continue on your current medications as directed. Please refer to the Current Medication list given to you today.  

## 2014-07-19 NOTE — Progress Notes (Signed)
Cardiology Office Note   Date:  07/19/2014   ID:  LINDBURG BANNER, DOB [birth year], MRN [id number]  PCP:  Walker Kehr, MD  Cardiologist:  Sherren Mocha, MD    Chief Complaint  Patient presents with  . Appointment    ROV     History of Present Illness: Harold Pittman is a 74 y.o. male who presents for follow-up of coronary artery disease. The patient had coronary bypass surgery in 1997. Other medical conditions include hypertension, hyperlipidemia, and chronic dyspnea. His breathing hasn't changed much over the past few years. He is dyspneic with one flight of stairs or with walking for exercise.   Overall he feels well and reports no changes in any of his symptoms since last year. He does not feel limited by any cardiovascular symptoms. He specifically denies chest pain or pressure, heart palpitations, edema, orthopnea, or PND. He has no lightheadedness or syncope. He does feel unsteady on his feet at times.   Past Medical History  Diagnosis Date  . Chronic airway obstruction, not elsewhere classified   . Personal history of tobacco use, presenting hazards to health   . Unspecified essential hypertension   . Coronary atherosclerosis of artery bypass graft   . Other and unspecified hyperlipidemia   . Type II or unspecified type diabetes mellitus without mention of complication, not stated as uncontrolled   . Esophageal reflux   . Blood in stool   . Other specified disorder of rectum and anus   . ED (erectile dysfunction)   . Osteoarthrosis, unspecified whether generalized or localized, unspecified site   . Gout, unspecified   . Anxiety state, unspecified     Past Surgical History  Procedure Laterality Date  . Coronary artery bypass graft  97    LIMA to LAD, sequential saphenous vein graft to the first and second diagonal, sequential saphenous vein graft to the intermediate OM and circumflex and SVG to RCA  . Rotator cuff repair    . Knee surgery      BILATERAL     Current Outpatient Prescriptions  Medication Sig Dispense Refill  . albuterol (PROAIR HFA) 108 (90 BASE) MCG/ACT inhaler Inhale 2 puffs into the lungs 2 (two) times daily. 1 Inhaler 11  . amLODipine (NORVASC) 10 MG tablet Take 1 tablet (10 mg total) by mouth daily. 90 tablet 3  . aspirin 81 MG EC tablet Take 81 mg by mouth daily.      . beclomethasone (QVAR) 80 MCG/ACT inhaler Inhale 2 puffs into the lungs 2 (two) times daily. 1 Inhaler 11  . benazepril (LOTENSIN) 40 MG tablet 1 tab po qd 90 tablet 3  . carvedilol (COREG) 25 MG tablet Take 1 tablet (25 mg total) by mouth 2 (two) times daily with a meal. 180 tablet 3  . Cholecalciferol 1000 UNITS tablet Take 2,000 Units by mouth daily.      . Cyanocobalamin (VITAMIN B-12) 1000 MCG SUBL Place 1 tablet (1,000 mcg total) under the tongue daily. 100 tablet 3  . furosemide (LASIX) 20 MG tablet Take 1 tablet (20 mg total) by mouth every morning. 90 tablet 3  . hydrALAZINE (APRESOLINE) 50 MG tablet Take 1 tablet (50 mg total) by mouth 3 (three) times daily. 270 tablet 3  . indomethacin (INDOCIN) 25 MG capsule TAKE TWO CAPSULES BY MOUTH THREE TIMES DAILY AS NEEDED FOR  GOUT  ATTACKS 90 capsule 3  . meloxicam (MOBIC) 15 MG tablet Take 1 tablet (15 mg total) by mouth daily.  30 tablet 0  . metFORMIN (GLUCOPHAGE) 500 MG tablet TAKE ONE TABLET BY MOUTH TWICE DAILY 180 tablet 3  . omeprazole (PRILOSEC) 20 MG capsule Take 1 capsule (20 mg total) by mouth daily. 90 capsule 3  . pravastatin (PRAVACHOL) 80 MG tablet Take 1 tablet (80 mg total) by mouth at bedtime. 90 tablet 3  . promethazine (PHENERGAN) 25 MG tablet Take 1 tablet (25 mg total) by mouth every 8 (eight) hours as needed for nausea or vomiting. 30 tablet 0  . ranitidine (ZANTAC) 300 MG tablet Take 1 tablet (300 mg total) by mouth every evening. 90 tablet 3  . tadalafil (CIALIS) 20 MG tablet Take as directed 10 tablet 5  . zolpidem (AMBIEN) 5 MG tablet Take 1 tablet (5 mg total) by mouth at  bedtime as needed for sleep. 30 tablet 5   No current facility-administered medications for this visit.    Allergies:   Review of patient's allergies indicates no known allergies.   Social History:  The patient  reports that he has quit smoking. He has never used smokeless tobacco. He reports that he does not drink alcohol or use illicit drugs.   Family History:  The patient's family history includes Coronary artery disease in his other; Depression in his sister; Heart disease in his sister; Heart disease (age of onset: 35) in his mother; Hypertension in his other; Mental illness in his father. There is no history of Colon cancer.    ROS:  Please see the history of present illness.  Otherwise, review of systems is positive for gait unsteadiness, dyspnea.  All other systems are reviewed and negative.    PHYSICAL EXAM: VS:  BP 138/62 mmHg  Pulse 63  Ht 6\' 2"  (1.88 m)  Wt 200 lb 6.4 oz (90.901 kg)  BMI 25.72 kg/m2 , BMI Body mass index is 25.72 kg/(m^2). GEN: Well nourished, well developed, in no acute distress HEENT: normal Neck: no JVD, no masses. No carotid bruits Cardiac: RRR without murmur or gallop                Respiratory:  clear to auscultation bilaterally, normal work of breathing GI: soft, nontender, nondistended, + BS MS: no deformity or atrophy Ext: no pretibial edema, pedal pulses 2+= bilaterally Skin: warm and dry, no rash Neuro:  Strength and sensation are intact Psych: euthymic mood, full affect  EKG:  EKG is ordered today. The ekg ordered today shows NSR 63 bpm, nonspecific T wave abnormality  Recent Labs: 03/18/2014: ALT 16; BUN 10; Creatinine 1.0; Potassium 4.1; Sodium 139   Lipid Panel     Component Value Date/Time   CHOL 106 03/18/2014 0922   TRIG 101.0 03/18/2014 0922   TRIG 109 01/28/2006 0841   HDL 35.40* 03/18/2014 0922   CHOLHDL 3 03/18/2014 0922   CHOLHDL 4.1 CALC 01/28/2006 0841   VLDL 20.2 03/18/2014 0922   LDLCALC 50 03/18/2014 0922       Wt Readings from Last 3 Encounters:  07/19/14 200 lb 6.4 oz (90.901 kg)  06/08/14 202 lb 12 oz (91.967 kg)  03/18/14 204 lb (92.534 kg)    ASSESSMENT AND PLAN: 1.  CAD, native vessel, with remote CABG: no angina now approaching 20 years out from CABG.  the patient's medical program was reviewed and includes aspirin for antiplatelet therapy, a statin drug, carvedilol, and an ACE inhibitor in the setting of his diabetes.  2. Essential HTN: Blood pressure is well controlled on his current medical program. Labs and  medications were reviewed with the patient today.  3. Hyperlipidemia: Lipids reviewed as above. He continues on pravastatin 80 mg daily.  4. Chronic dyspnea. No change in symptoms over many years. Suspect deconditioning as the primary issue.   Current medicines are reviewed with the patient today.  The patient does not have concerns regarding medicines.  The following changes have been made:  no change  Labs/ tests ordered today include:  No orders of the defined types were placed in this encounter.   Disposition:   FU one year  Signed, Sherren Mocha, MD  07/19/2014 12:45 PM    [address], [place], [place]  [address] Phone: [phone]; Fax: [phone]

## 2014-07-22 ENCOUNTER — Other Ambulatory Visit: Payer: Self-pay | Admitting: Internal Medicine

## 2014-09-10 ENCOUNTER — Ambulatory Visit (INDEPENDENT_AMBULATORY_CARE_PROVIDER_SITE_OTHER): Payer: Medicare Other | Admitting: Internal Medicine

## 2014-09-10 ENCOUNTER — Encounter: Payer: Self-pay | Admitting: Internal Medicine

## 2014-09-10 VITALS — BP 140/84 | HR 70 | Wt 201.0 lb

## 2014-09-10 DIAGNOSIS — I10 Essential (primary) hypertension: Secondary | ICD-10-CM

## 2014-09-10 DIAGNOSIS — R269 Unspecified abnormalities of gait and mobility: Secondary | ICD-10-CM

## 2014-09-10 DIAGNOSIS — I25810 Atherosclerosis of coronary artery bypass graft(s) without angina pectoris: Secondary | ICD-10-CM | POA: Diagnosis not present

## 2014-09-10 DIAGNOSIS — E1159 Type 2 diabetes mellitus with other circulatory complications: Secondary | ICD-10-CM

## 2014-09-10 DIAGNOSIS — E1151 Type 2 diabetes mellitus with diabetic peripheral angiopathy without gangrene: Secondary | ICD-10-CM

## 2014-09-10 NOTE — Assessment & Plan Note (Signed)
ASA, Pravachol, Coreg, Norvasc Rx No angina 

## 2014-09-10 NOTE — Progress Notes (Signed)
   Subjective:    HPI  The patient presents for a follow-up of  chronic hypertension, chronic dyslipidemia, type 2 diabetes controlled with medicines. Occ diarrhea - hot new. Seems to not to be triggered by milk.  F/u some balance issues - no ETOH, no falls...   SBP 100-110 at home F/u B feet swelling - resolved    Review of Systems  Constitutional: Negative for appetite change, fatigue and unexpected weight change.  HENT: Negative for congestion, nosebleeds, sneezing, sore throat and trouble swallowing.   Eyes: Negative for itching and visual disturbance.  Cardiovascular: Negative for chest pain, palpitations and leg swelling.  Gastrointestinal: Negative for nausea, blood in stool and abdominal distention.  Genitourinary: Negative for frequency and hematuria.  Musculoskeletal: Negative for back pain, joint swelling, gait problem and neck pain.  Skin: Negative for rash.  Neurological: Negative for dizziness, tremors, speech difficulty and weakness.  Psychiatric/Behavioral: Negative for sleep disturbance, dysphoric mood and agitation. The patient is not nervous/anxious.    BP Readings from Last 3 Encounters:  09/10/14 140/84  07/19/14 138/62  06/08/14 148/80   Wt Readings from Last 3 Encounters:  09/10/14 201 lb (91.173 kg)  07/19/14 200 lb 6.4 oz (90.901 kg)  06/08/14 202 lb 12 oz (91.967 kg)        Objective:   Physical Exam  Constitutional: He is oriented to person, place, and time. He appears well-developed. No distress.  NAD  HENT:  Mouth/Throat: Oropharynx is clear and moist.  Eyes: Conjunctivae are normal. Pupils are equal, round, and reactive to light.  Neck: Normal range of motion. No JVD present. No thyromegaly present.  Cardiovascular: Normal rate, regular rhythm, normal heart sounds and intact distal pulses.  Exam reveals no gallop and no friction rub.   No murmur heard. Pulmonary/Chest: Effort normal and breath sounds normal. No respiratory distress. He  has no wheezes. He has no rales. He exhibits no tenderness.  Abdominal: Soft. Bowel sounds are normal. He exhibits no distension and no mass. There is no tenderness. There is no rebound and no guarding.  Musculoskeletal: Normal range of motion. He exhibits no edema or tenderness.  Lymphadenopathy:    He has no cervical adenopathy.  Neurological: He is alert and oriented to person, place, and time. He has normal reflexes. No cranial nerve deficit. He exhibits normal muscle tone. He displays a negative Romberg sign. Coordination and gait normal.  No meningeal signs  Skin: Skin is warm and dry. No rash noted.  Psychiatric: He has a normal mood and affect. His behavior is normal. Judgment and thought content normal.  B lat ankles are with no edema Mild ataxia  Lab Results  Component Value Date   WBC 5.4 06/29/2011   HGB 11.8* 06/29/2011   HCT 36.5* 06/29/2011   PLT 155.0 06/29/2011   GLUCOSE 217* 03/18/2014   CHOL 106 03/18/2014   TRIG 101.0 03/18/2014   HDL 35.40* 03/18/2014   LDLCALC 50 03/18/2014   ALT 16 03/18/2014   AST 20 03/18/2014   NA 139 03/18/2014   K 4.1 03/18/2014   CL 104 03/18/2014   CREATININE 1.0 03/18/2014   BUN 10 03/18/2014   CO2 28 03/18/2014   TSH 0.91 10/08/2011   PSA 1.31 03/22/2010   HGBA1C 7.3* 03/18/2014   MICROALBUR 38.7* 11/24/2013   A1c 7.2 on 09/09/14    Assessment & Plan:  Patient ID: Harold Pittman, male   DOB: 09-07-1940, 74 y.o.   MRN: [id number]

## 2014-09-10 NOTE — Assessment & Plan Note (Signed)
Chronic BP is nl at home Lotensin, Coreg, Hydralazine, Lasix Rx

## 2014-09-10 NOTE — Assessment & Plan Note (Addendum)
A1c 7.2 on 09/09/14 Metformin, Pravastatin, ASA, Coreg, Lotensin

## 2014-09-10 NOTE — Progress Notes (Signed)
Pre visit review using our clinic review tool, if applicable. No additional management support is needed unless otherwise documented below in the visit note. 

## 2014-09-10 NOTE — Assessment & Plan Note (Signed)
Re-start balance exercises

## 2014-10-20 ENCOUNTER — Encounter: Payer: Self-pay | Admitting: Gastroenterology

## 2014-12-14 ENCOUNTER — Ambulatory Visit (INDEPENDENT_AMBULATORY_CARE_PROVIDER_SITE_OTHER): Payer: Medicare Other | Admitting: Internal Medicine

## 2014-12-14 ENCOUNTER — Encounter: Payer: Self-pay | Admitting: Internal Medicine

## 2014-12-14 ENCOUNTER — Other Ambulatory Visit (INDEPENDENT_AMBULATORY_CARE_PROVIDER_SITE_OTHER): Payer: Medicare Other

## 2014-12-14 VITALS — BP 156/88 | HR 73 | Wt 199.0 lb

## 2014-12-14 DIAGNOSIS — I10 Essential (primary) hypertension: Secondary | ICD-10-CM | POA: Diagnosis not present

## 2014-12-14 DIAGNOSIS — K219 Gastro-esophageal reflux disease without esophagitis: Secondary | ICD-10-CM | POA: Diagnosis not present

## 2014-12-14 DIAGNOSIS — I25810 Atherosclerosis of coronary artery bypass graft(s) without angina pectoris: Secondary | ICD-10-CM | POA: Diagnosis not present

## 2014-12-14 DIAGNOSIS — Z23 Encounter for immunization: Secondary | ICD-10-CM | POA: Diagnosis not present

## 2014-12-14 DIAGNOSIS — R269 Unspecified abnormalities of gait and mobility: Secondary | ICD-10-CM | POA: Diagnosis not present

## 2014-12-14 DIAGNOSIS — E1159 Type 2 diabetes mellitus with other circulatory complications: Secondary | ICD-10-CM

## 2014-12-14 DIAGNOSIS — E785 Hyperlipidemia, unspecified: Secondary | ICD-10-CM

## 2014-12-14 DIAGNOSIS — M1000 Idiopathic gout, unspecified site: Secondary | ICD-10-CM

## 2014-12-14 DIAGNOSIS — E1151 Type 2 diabetes mellitus with diabetic peripheral angiopathy without gangrene: Secondary | ICD-10-CM

## 2014-12-14 LAB — LIPID PANEL
CHOLESTEROL: 120 mg/dL (ref 0–200)
HDL: 32.3 mg/dL — ABNORMAL LOW (ref >39.00)
LDL Cholesterol: 63 mg/dL (ref 0–99)
NonHDL: 88.09
TRIGLYCERIDES: 127 mg/dL (ref 0.0–149.0)
Total CHOL/HDL Ratio: 4
VLDL: 25.4 mg/dL (ref 0.0–40.0)

## 2014-12-14 LAB — BASIC METABOLIC PANEL
BUN: 15 mg/dL (ref 6–23)
CO2: 29 mEq/L (ref 19–32)
CREATININE: 1.07 mg/dL (ref 0.40–1.50)
Calcium: 9.1 mg/dL (ref 8.4–10.5)
Chloride: 105 mEq/L (ref 96–112)
GFR: 86.96 mL/min (ref >60.00)
Glucose, Bld: 166 mg/dL — ABNORMAL HIGH (ref 70–99)
Potassium: 4.1 mEq/L (ref 3.5–5.1)
Sodium: 144 mEq/L (ref 135–145)

## 2014-12-14 LAB — HEMOGLOBIN A1C: HEMOGLOBIN A1C: 6.6 % — AB (ref 4.6–6.5)

## 2014-12-14 NOTE — Assessment & Plan Note (Signed)
Chronic w/PVD, CAD Metformin, Pravastatin, ASA, Coreg, Lotensin Labs

## 2014-12-14 NOTE — Assessment & Plan Note (Signed)
No relapse 

## 2014-12-14 NOTE — Assessment & Plan Note (Signed)
ASA, Pravachol, Coreg, Norvasc Rx No angina 

## 2014-12-14 NOTE — Assessment & Plan Note (Signed)
Chronic BP is nl at home Lotensin, Coreg, Hydralazine, Lasix Rx

## 2014-12-14 NOTE — Assessment & Plan Note (Signed)
On Omeprazole 

## 2014-12-14 NOTE — Progress Notes (Signed)
Subjective:  Patient ID: [name], male    DOB: 21-Aug-1940  Age: 74 y.o. MRN: [id number]  CC: No chief complaint on file.   HPI Jayz Mau Kees presents for CAD, HTN, dyslipidemia f/u  Outpatient Prescriptions Prior to Visit  Medication Sig Dispense Refill  . albuterol (PROAIR HFA) 108 (90 BASE) MCG/ACT inhaler Inhale 2 puffs into the lungs 2 (two) times daily. 1 Inhaler 11  . amLODipine (NORVASC) 10 MG tablet Take 1 tablet (10 mg total) by mouth daily. 90 tablet 3  . aspirin 81 MG EC tablet Take 81 mg by mouth daily.      . beclomethasone (QVAR) 80 MCG/ACT inhaler Inhale 2 puffs into the lungs 2 (two) times daily. 1 Inhaler 11  . benazepril (LOTENSIN) 40 MG tablet 1 tab po qd 90 tablet 3  . carvedilol (COREG) 25 MG tablet Take 1 tablet (25 mg total) by mouth 2 (two) times daily with a meal. 180 tablet 3  . Cholecalciferol 1000 UNITS tablet Take 2,000 Units by mouth daily.      . Cyanocobalamin (VITAMIN B-12) 1000 MCG SUBL Place 1 tablet (1,000 mcg total) under the tongue daily. 100 tablet 3  . furosemide (LASIX) 20 MG tablet Take 1 tablet (20 mg total) by mouth every morning. 90 tablet 3  . hydrALAZINE (APRESOLINE) 50 MG tablet Take 1 tablet (50 mg total) by mouth 3 (three) times daily. 270 tablet 3  . indomethacin (INDOCIN) 25 MG capsule TAKE TWO CAPSULES BY MOUTH THREE TIMES DAILY AS NEEDED FOR  GOUT  ATTACKS 90 capsule 3  . loperamide (IMODIUM) 2 MG capsule Take 1 capsule by mouth 4  times daily as needed for  diarrhea or loose stools 180 capsule 0  . meloxicam (MOBIC) 15 MG tablet Take 1 tablet (15 mg total) by mouth daily. 30 tablet 0  . metFORMIN (GLUCOPHAGE) 500 MG tablet TAKE ONE TABLET BY MOUTH TWICE DAILY 180 tablet 3  . omeprazole (PRILOSEC) 20 MG capsule Take 1 capsule (20 mg total) by mouth daily. 90 capsule 3  . pravastatin (PRAVACHOL) 80 MG tablet Take 1 tablet (80 mg total) by mouth at bedtime. 90 tablet 3  . promethazine (PHENERGAN) 25 MG tablet Take 1 tablet (25  mg total) by mouth every 8 (eight) hours as needed for nausea or vomiting. 30 tablet 0  . ranitidine (ZANTAC) 300 MG tablet Take 1 tablet (300 mg total) by mouth every evening. 90 tablet 3  . tadalafil (CIALIS) 20 MG tablet Take as directed 10 tablet 5  . zolpidem (AMBIEN) 5 MG tablet Take 1 tablet (5 mg total) by mouth at bedtime as needed for sleep. 30 tablet 5   No facility-administered medications prior to visit.    ROS Review of Systems  Constitutional: Negative for appetite change, fatigue and unexpected weight change.  HENT: Negative for congestion, nosebleeds, sneezing, sore throat and trouble swallowing.   Eyes: Negative for itching and visual disturbance.  Respiratory: Negative for cough.   Cardiovascular: Negative for chest pain, palpitations and leg swelling.  Gastrointestinal: Negative for nausea, diarrhea, blood in stool and abdominal distention.  Genitourinary: Negative for frequency and hematuria.  Musculoskeletal: Negative for back pain, joint swelling, gait problem and neck pain.  Skin: Negative for rash.  Neurological: Negative for dizziness, tremors, speech difficulty and weakness.  Psychiatric/Behavioral: Negative for sleep disturbance, dysphoric mood and agitation. The patient is not nervous/anxious.     Objective:  BP 156/88 mmHg  Pulse 73  Wt 199  lb (90.266 kg)  SpO2 94%  BP Readings from Last 3 Encounters:  12/14/14 156/88  09/10/14 140/84  07/19/14 138/62    Wt Readings from Last 3 Encounters:  12/14/14 199 lb (90.266 kg)  09/10/14 201 lb (91.173 kg)  07/19/14 200 lb 6.4 oz (90.901 kg)    Physical Exam  Constitutional: He is oriented to person, place, and time. He appears well-developed. No distress.  NAD  HENT:  Mouth/Throat: Oropharynx is clear and moist.  Eyes: Conjunctivae are normal. Pupils are equal, round, and reactive to light.  Neck: Normal range of motion. No JVD present. No thyromegaly present.  Cardiovascular: Normal rate, regular  rhythm, normal heart sounds and intact distal pulses.  Exam reveals no gallop and no friction rub.   No murmur heard. Pulmonary/Chest: Effort normal and breath sounds normal. No respiratory distress. He has no wheezes. He has no rales. He exhibits no tenderness.  Abdominal: Soft. Bowel sounds are normal. He exhibits no distension and no mass. There is no tenderness. There is no rebound and no guarding.  Musculoskeletal: Normal range of motion. He exhibits no edema or tenderness.  Lymphadenopathy:    He has no cervical adenopathy.  Neurological: He is alert and oriented to person, place, and time. He has normal reflexes. No cranial nerve deficit. He exhibits normal muscle tone. He displays a negative Romberg sign. Coordination and gait normal.  Skin: Skin is warm and dry. No rash noted.  Psychiatric: He has a normal mood and affect. His behavior is normal. Judgment and thought content normal.    Lab Results  Component Value Date   WBC 5.4 06/29/2011   HGB 11.8* 06/29/2011   HCT 36.5* 06/29/2011   PLT 155.0 06/29/2011   GLUCOSE 166* 12/14/2014   CHOL 120 12/14/2014   TRIG 127.0 12/14/2014   HDL 32.30* 12/14/2014   LDLCALC 63 12/14/2014   ALT 16 03/18/2014   AST 20 03/18/2014   NA 144 12/14/2014   K 4.1 12/14/2014   CL 105 12/14/2014   CREATININE 1.07 12/14/2014   BUN 15 12/14/2014   CO2 29 12/14/2014   TSH 0.91 10/08/2011   PSA 1.31 03/22/2010   HGBA1C 6.6* 12/14/2014   MICROALBUR 38.7* 11/24/2013    Dg Lumbar Spine Complete  01/12/2014   CLINICAL DATA:  One week history of low back pain. No known injuries.  EXAM: LUMBAR SPINE - COMPLETE 4+ VIEW  COMPARISON:  None.  FINDINGS: Five non rib-bearing lumbar vertebrae with anatomic alignment. Straightening of the usual lumbar lordosis. No fractures. Mild disc space narrowing and endplate hypertrophic changes at L3-4 and L4-5. Bridging osteophytes at L4-5. No pars defects. Right-sided facet degenerative changes at L4-5. Sacroiliac  joints intact with mild degenerative changes.  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 Right-sided facet degenerative changes at L4-5. Straightening of the usual lordosis may reflect positioning or spasm.   Electronically Signed   By: Evangeline Dakin M.D.   On: 01/12/2014 14:53    Assessment & Plan:   Diagnoses and all orders for this visit:  Need for influenza vaccination -     Flu vaccine HIGH DOSE PF  Essential hypertension  Gastroesophageal reflux disease without esophagitis  DM (diabetes mellitus), type 2 with peripheral vascular complications  Idiopathic gout, unspecified chronicity, unspecified site  Dyslipidemia   I am having Mr. Hendel maintain his aspirin, Cholecalciferol, benazepril, albuterol, beclomethasone, tadalafil, indomethacin, zolpidem, promethazine, meloxicam, Vitamin B-12, hydrALAZINE, metFORMIN, amLODipine, pravastatin, carvedilol, furosemide, omeprazole, ranitidine, and  loperamide.  No orders of the defined types were placed in this encounter.     Follow-up: Return in about 4 months (around 04/15/2015) for Wellness Exam.  Walker Kehr, MD

## 2014-12-14 NOTE — Progress Notes (Signed)
Pre visit review using our clinic review tool, if applicable. No additional management support is needed unless otherwise documented below in the visit note. 

## 2014-12-14 NOTE — Assessment & Plan Note (Signed)
Chronic  Pravachol

## 2015-01-17 ENCOUNTER — Other Ambulatory Visit: Payer: Self-pay | Admitting: Internal Medicine

## 2015-02-24 ENCOUNTER — Other Ambulatory Visit: Payer: Self-pay | Admitting: *Deleted

## 2015-02-24 MED ORDER — HYDRALAZINE HCL 50 MG PO TABS: 50.0000 mg | ORAL_TABLET | Freq: Three times a day (TID) | ORAL | Status: DC

## 2015-02-24 MED ORDER — PRAVASTATIN SODIUM 80 MG PO TABS: 80.0000 mg | ORAL_TABLET | Freq: Every day | ORAL | Status: DC

## 2015-02-24 MED ORDER — CARVEDILOL 25 MG PO TABS: 25.0000 mg | ORAL_TABLET | Freq: Two times a day (BID) | ORAL | Status: DC

## 2015-03-10 ENCOUNTER — Ambulatory Visit (INDEPENDENT_AMBULATORY_CARE_PROVIDER_SITE_OTHER): Payer: Medicare Other | Admitting: Internal Medicine

## 2015-03-10 ENCOUNTER — Encounter: Payer: Self-pay | Admitting: Internal Medicine

## 2015-03-10 VITALS — BP 148/80 | HR 77 | Temp 98.8°F | Wt 198.0 lb

## 2015-03-10 DIAGNOSIS — J069 Acute upper respiratory infection, unspecified: Secondary | ICD-10-CM

## 2015-03-10 MED ORDER — PROMETHAZINE-CODEINE 6.25-10 MG/5ML PO SYRP: 5.0000 mL | ORAL_SOLUTION | ORAL | Status: DC | PRN

## 2015-03-10 MED ORDER — AZITHROMYCIN 250 MG PO TABS: ORAL_TABLET | ORAL | Status: DC

## 2015-03-10 NOTE — Progress Notes (Signed)
Subjective:  Patient ID: [name], male    DOB: 1940-07-13  Age: 74 y.o. MRN: [id number]  CC: No chief complaint on file.   HPI Demarcos Palomares Remillard presents for cold sx's x 3 days: cough, fever, nasal d/c, fatigue  Outpatient Prescriptions Prior to Visit  Medication Sig Dispense Refill  . albuterol (PROAIR HFA) 108 (90 BASE) MCG/ACT inhaler Inhale 2 puffs into the lungs 2 (two) times daily. 1 Inhaler 11  . amLODipine (NORVASC) 10 MG tablet Take 1 tablet by mouth  daily 90 tablet 3  . aspirin 81 MG EC tablet Take 81 mg by mouth daily.      . beclomethasone (QVAR) 80 MCG/ACT inhaler Inhale 2 puffs into the lungs 2 (two) times daily. 1 Inhaler 11  . benazepril (LOTENSIN) 40 MG tablet 1 tab po qd 90 tablet 3  . carvedilol (COREG) 25 MG tablet Take 1 tablet (25 mg total) by mouth 2 (two) times daily with a meal. 180 tablet 3  . Cholecalciferol 1000 UNITS tablet Take 2,000 Units by mouth daily.      . Cyanocobalamin (VITAMIN B-12) 1000 MCG SUBL Place 1 tablet (1,000 mcg total) under the tongue daily. 100 tablet 3  . furosemide (LASIX) 20 MG tablet Take 1 tablet by mouth  every morning 90 tablet 3  . hydrALAZINE (APRESOLINE) 50 MG tablet Take 1 tablet (50 mg total) by mouth 3 (three) times daily. 270 tablet 3  . indomethacin (INDOCIN) 25 MG capsule TAKE TWO CAPSULES BY MOUTH THREE TIMES DAILY AS NEEDED FOR  GOUT  ATTACKS 90 capsule 3  . loperamide (IMODIUM) 2 MG capsule Take 1 capsule by mouth 4  times daily as needed for  diarrhea or loose stools 180 capsule 3  . meloxicam (MOBIC) 15 MG tablet Take 1 tablet (15 mg total) by mouth daily. 30 tablet 0  . metFORMIN (GLUCOPHAGE) 500 MG tablet Take 1 tablet by mouth  twice a day 180 tablet 3  . omeprazole (PRILOSEC) 20 MG capsule Take 1 capsule by mouth  daily 90 capsule 3  . pravastatin (PRAVACHOL) 80 MG tablet Take 1 tablet (80 mg total) by mouth at bedtime. 90 tablet 3  . promethazine (PHENERGAN) 25 MG tablet Take 1 tablet (25 mg total) by  mouth every 8 (eight) hours as needed for nausea or vomiting. 30 tablet 0  . ranitidine (ZANTAC) 300 MG tablet Take 1 tablet by mouth  every evening 90 tablet 3  . tadalafil (CIALIS) 20 MG tablet Take as directed 10 tablet 5  . zolpidem (AMBIEN) 5 MG tablet Take 1 tablet (5 mg total) by mouth at bedtime as needed for sleep. 30 tablet 5   No facility-administered medications prior to visit.    ROS Review of Systems  Constitutional: Positive for chills and fatigue. Negative for appetite change and unexpected weight change.  HENT: Positive for congestion, postnasal drip, rhinorrhea and sore throat. Negative for nosebleeds, sneezing and trouble swallowing.   Eyes: Negative for itching and visual disturbance.  Respiratory: Positive for cough.   Cardiovascular: Negative for chest pain, palpitations and leg swelling.  Gastrointestinal: Negative for nausea, diarrhea, blood in stool and abdominal distention.  Genitourinary: Negative for frequency and hematuria.  Musculoskeletal: Negative for back pain, joint swelling, gait problem and neck pain.  Skin: Negative for rash.  Neurological: Positive for weakness. Negative for dizziness, tremors and speech difficulty.  Psychiatric/Behavioral: Negative for sleep disturbance, dysphoric mood and agitation. The patient is not nervous/anxious.  Objective:  BP 148/80 mmHg  Pulse 77  Temp(Src) 98.8 F (37.1 C) (Oral)  Wt 198 lb (89.812 kg)  SpO2 94%  BP Readings from Last 3 Encounters:  03/10/15 148/80  12/14/14 156/88  09/10/14 140/84    Wt Readings from Last 3 Encounters:  03/10/15 198 lb (89.812 kg)  12/14/14 199 lb (90.266 kg)  09/10/14 201 lb (91.173 kg)    Physical Exam  Constitutional: He is oriented to person, place, and time. He appears well-developed. No distress.  NAD  HENT:  Mouth/Throat: Oropharynx is clear and moist. No oropharyngeal exudate.  Eyes: Conjunctivae are normal. Pupils are equal, round, and reactive to light.    Neck: Normal range of motion. No JVD present. No thyromegaly present.  Cardiovascular: Normal rate, regular rhythm, normal heart sounds and intact distal pulses.  Exam reveals no gallop and no friction rub.   No murmur heard. Pulmonary/Chest: Effort normal and breath sounds normal. No respiratory distress. He has no wheezes. He has no rales. He exhibits no tenderness.  Abdominal: Soft. Bowel sounds are normal. He exhibits no distension and no mass. There is no tenderness. There is no rebound and no guarding.  Musculoskeletal: Normal range of motion. He exhibits no edema or tenderness.  Lymphadenopathy:    He has no cervical adenopathy.  Neurological: He is alert and oriented to person, place, and time. He has normal reflexes. No cranial nerve deficit. He exhibits normal muscle tone. He displays a negative Romberg sign. Coordination and gait normal.  Skin: Skin is warm and dry. No rash noted.  Psychiatric: He has a normal mood and affect. His behavior is normal. Judgment and thought content normal.  eryth throat, nares  Lab Results  Component Value Date   WBC 5.4 06/29/2011   HGB 11.8* 06/29/2011   HCT 36.5* 06/29/2011   PLT 155.0 06/29/2011   GLUCOSE 166* 12/14/2014   CHOL 120 12/14/2014   TRIG 127.0 12/14/2014   HDL 32.30* 12/14/2014   LDLCALC 63 12/14/2014   ALT 16 03/18/2014   AST 20 03/18/2014   NA 144 12/14/2014   K 4.1 12/14/2014   CL 105 12/14/2014   CREATININE 1.07 12/14/2014   BUN 15 12/14/2014   CO2 29 12/14/2014   TSH 0.91 10/08/2011   PSA 1.31 03/22/2010   HGBA1C 6.6* 12/14/2014   MICROALBUR 38.7* 11/24/2013    Dg Lumbar Spine Complete  01/12/2014  CLINICAL DATA:  One week history of low back pain. No known injuries. EXAM: LUMBAR SPINE - COMPLETE 4+ VIEW COMPARISON:  None. FINDINGS: Five non rib-bearing lumbar vertebrae with anatomic alignment. Straightening of the usual lumbar lordosis. No fractures. Mild disc space narrowing and endplate hypertrophic changes at  L3-4 and L4-5. Bridging osteophytes at L4-5. No pars defects. Right-sided facet degenerative changes at L4-5. Sacroiliac joints intact with mild degenerative changes. 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 Right-sided facet degenerative changes at L4-5. Straightening of the usual lordosis may reflect positioning or spasm. Electronically Signed   By: Evangeline Dakin M.D.   On: 01/12/2014 14:53    Assessment & Plan:   Diagnoses and all orders for this visit:  Acute upper respiratory infection  Other orders -     promethazine-codeine (PHENERGAN WITH CODEINE) 6.25-10 MG/5ML syrup; Take 5 mLs by mouth every 4 (four) hours as needed. -     azithromycin (ZITHROMAX Z-PAK) 250 MG tablet; As directed  I am having Mr. Pheng start on promethazine-codeine and azithromycin. I am also  having him maintain his aspirin, Cholecalciferol, benazepril, albuterol, beclomethasone, tadalafil, indomethacin, zolpidem, promethazine, meloxicam, Vitamin B-12, ranitidine, amLODipine, loperamide, furosemide, omeprazole, metFORMIN, carvedilol, pravastatin, and hydrALAZINE.  Meds ordered this encounter  Medications  . promethazine-codeine (PHENERGAN WITH CODEINE) 6.25-10 MG/5ML syrup    Sig: Take 5 mLs by mouth every 4 (four) hours as needed.    Dispense:  300 mL    Refill:  0  . azithromycin (ZITHROMAX Z-PAK) 250 MG tablet    Sig: As directed    Dispense:  6 each    Refill:  0     Follow-up: No Follow-up on file.  Walker Kehr, MD

## 2015-03-10 NOTE — Assessment & Plan Note (Signed)
Prom-cod syr Zpac if worse 

## 2015-03-10 NOTE — Progress Notes (Signed)
Pre visit review using our clinic review tool, if applicable. No additional management support is needed unless otherwise documented below in the visit note. 

## 2015-03-10 NOTE — Patient Instructions (Signed)
Use over-the-counter  "Afrin" nasal spray for nasal congestion as directed instead. Use" Delsym" or" Robitussin" cough syrup varietis for cough.  You can use plain "Tylenol" or "Advil" for fever, chills and achyness. Use Halls or Ricola cough drops.   "Common cold" symptoms are usually triggered by a virus.  The antibiotics are usually not necessary. On average, a" viral cold" illness would take 4-7 days to resolve. Please, make an appointment if you are not better or if you're worse.

## 2015-04-15 ENCOUNTER — Encounter: Payer: Self-pay | Admitting: Internal Medicine

## 2015-04-15 ENCOUNTER — Ambulatory Visit (INDEPENDENT_AMBULATORY_CARE_PROVIDER_SITE_OTHER): Payer: Medicare Other | Admitting: Internal Medicine

## 2015-04-15 VITALS — BP 139/76 | HR 68 | Wt 197.0 lb

## 2015-04-15 DIAGNOSIS — R05 Cough: Secondary | ICD-10-CM

## 2015-04-15 DIAGNOSIS — E785 Hyperlipidemia, unspecified: Secondary | ICD-10-CM | POA: Diagnosis not present

## 2015-04-15 DIAGNOSIS — M1000 Idiopathic gout, unspecified site: Secondary | ICD-10-CM | POA: Diagnosis not present

## 2015-04-15 DIAGNOSIS — I25810 Atherosclerosis of coronary artery bypass graft(s) without angina pectoris: Secondary | ICD-10-CM | POA: Diagnosis not present

## 2015-04-15 DIAGNOSIS — E1151 Type 2 diabetes mellitus with diabetic peripheral angiopathy without gangrene: Secondary | ICD-10-CM

## 2015-04-15 DIAGNOSIS — R059 Cough, unspecified: Secondary | ICD-10-CM

## 2015-04-15 NOTE — Assessment & Plan Note (Signed)
Chronic w/PVD, CAD Metformin, Pravastatin, ASA, Coreg, Lotensin 

## 2015-04-15 NOTE — Assessment & Plan Note (Signed)
URI resolved 1/17

## 2015-04-15 NOTE — Progress Notes (Signed)
Pre visit review using our clinic review tool, if applicable. No additional management support is needed unless otherwise documented below in the visit note. 

## 2015-04-15 NOTE — Assessment & Plan Note (Signed)
Doing well now 

## 2015-04-15 NOTE — Progress Notes (Signed)
Subjective:  Patient ID: Harold Pittman, male    DOB: [birth year]  Age: 75 y.o. MRN: [id number]  CC: No chief complaint on file.   HPI Harold Pittman presents for CAD, HTN, dyslipidemia. F/u URI - resolved  Outpatient Prescriptions Prior to Visit  Medication Sig Dispense Refill  . albuterol (PROAIR HFA) 108 (90 BASE) MCG/ACT inhaler Inhale 2 puffs into the lungs 2 (two) times daily. 1 Inhaler 11  . amLODipine (NORVASC) 10 MG tablet Take 1 tablet by mouth  daily 90 tablet 3  . aspirin 81 MG EC tablet Take 81 mg by mouth daily.      . beclomethasone (QVAR) 80 MCG/ACT inhaler Inhale 2 puffs into the lungs 2 (two) times daily. 1 Inhaler 11  . benazepril (LOTENSIN) 40 MG tablet 1 tab po qd 90 tablet 3  . carvedilol (COREG) 25 MG tablet Take 1 tablet (25 mg total) by mouth 2 (two) times daily with a meal. 180 tablet 3  . Cholecalciferol 1000 UNITS tablet Take 2,000 Units by mouth daily.      . Cyanocobalamin (VITAMIN B-12) 1000 MCG SUBL Place 1 tablet (1,000 mcg total) under the tongue daily. 100 tablet 3  . furosemide (LASIX) 20 MG tablet Take 1 tablet by mouth  every morning 90 tablet 3  . hydrALAZINE (APRESOLINE) 50 MG tablet Take 1 tablet (50 mg total) by mouth 3 (three) times daily. 270 tablet 3  . indomethacin (INDOCIN) 25 MG capsule TAKE TWO CAPSULES BY MOUTH THREE TIMES DAILY AS NEEDED FOR  GOUT  ATTACKS 90 capsule 3  . loperamide (IMODIUM) 2 MG capsule Take 1 capsule by mouth 4  times daily as needed for  diarrhea or loose stools 180 capsule 3  . meloxicam (MOBIC) 15 MG tablet Take 1 tablet (15 mg total) by mouth daily. 30 tablet 0  . metFORMIN (GLUCOPHAGE) 500 MG tablet Take 1 tablet by mouth  twice a day 180 tablet 3  . omeprazole (PRILOSEC) 20 MG capsule Take 1 capsule by mouth  daily 90 capsule 3  . pravastatin (PRAVACHOL) 80 MG tablet Take 1 tablet (80 mg total) by mouth at bedtime. 90 tablet 3  . promethazine (PHENERGAN) 25 MG tablet Take 1 tablet (25 mg total) by mouth every 8  (eight) hours as needed for nausea or vomiting. 30 tablet 0  . ranitidine (ZANTAC) 300 MG tablet Take 1 tablet by mouth  every evening 90 tablet 3  . tadalafil (CIALIS) 20 MG tablet Take as directed 10 tablet 5  . zolpidem (AMBIEN) 5 MG tablet Take 1 tablet (5 mg total) by mouth at bedtime as needed for sleep. 30 tablet 5  . promethazine-codeine (PHENERGAN WITH CODEINE) 6.25-10 MG/5ML syrup Take 5 mLs by mouth every 4 (four) hours as needed. 300 mL 0  . azithromycin (ZITHROMAX Z-PAK) 250 MG tablet As directed (Patient not taking: Reported on 04/15/2015) 6 each 0   No facility-administered medications prior to visit.    ROS Review of Systems  Constitutional: Negative for appetite change, fatigue and unexpected weight change.  HENT: Negative for congestion, nosebleeds, sneezing, sore throat and trouble swallowing.   Eyes: Negative for itching and visual disturbance.  Respiratory: Negative for cough.   Cardiovascular: Negative for chest pain, palpitations and leg swelling.  Gastrointestinal: Negative for nausea, diarrhea, blood in stool and abdominal distention.  Genitourinary: Negative for frequency and hematuria.  Musculoskeletal: Negative for back pain, joint swelling, gait problem and neck pain.  Skin: Negative for rash.  Neurological: Negative for dizziness, tremors, speech difficulty and weakness.  Psychiatric/Behavioral: Negative for sleep disturbance, dysphoric mood and agitation. The patient is not nervous/anxious.     Objective:  BP 139/76 mmHg  Pulse 68  Wt 197 lb (89.359 kg)  SpO2 95%  BP Readings from Last 3 Encounters:  04/15/15 139/76  03/10/15 148/80  12/14/14 156/88    Wt Readings from Last 3 Encounters:  04/15/15 197 lb (89.359 kg)  03/10/15 198 lb (89.812 kg)  12/14/14 199 lb (90.266 kg)    Physical Exam  Constitutional: He is oriented to person, place, and time. He appears well-developed. No distress.  NAD  HENT:  Mouth/Throat: Oropharynx is clear and  moist.  Eyes: Conjunctivae are normal. Pupils are equal, round, and reactive to light.  Neck: Normal range of motion. No JVD present. No thyromegaly present.  Cardiovascular: Normal rate, regular rhythm, normal heart sounds and intact distal pulses.  Exam reveals no gallop and no friction rub.   No murmur heard. Pulmonary/Chest: Effort normal and breath sounds normal. No respiratory distress. He has no wheezes. He has no rales. He exhibits no tenderness.  Abdominal: Soft. Bowel sounds are normal. He exhibits no distension and no mass. There is no tenderness. There is no rebound and no guarding.  Musculoskeletal: Normal range of motion. He exhibits no edema or tenderness.  Lymphadenopathy:    He has no cervical adenopathy.  Neurological: He is alert and oriented to person, place, and time. He has normal reflexes. No cranial nerve deficit. He exhibits normal muscle tone. He displays a negative Romberg sign. Coordination and gait normal.  Skin: Skin is warm and dry. No rash noted.  Psychiatric: He has a normal mood and affect. His behavior is normal. Judgment and thought content normal.    Lab Results  Component Value Date   WBC 5.4 06/29/2011   HGB 11.8* 06/29/2011   HCT 36.5* 06/29/2011   PLT 155.0 06/29/2011   GLUCOSE 166* 12/14/2014   CHOL 120 12/14/2014   TRIG 127.0 12/14/2014   HDL 32.30* 12/14/2014   LDLCALC 63 12/14/2014   ALT 16 03/18/2014   AST 20 03/18/2014   NA 144 12/14/2014   K 4.1 12/14/2014   CL 105 12/14/2014   CREATININE 1.07 12/14/2014   BUN 15 12/14/2014   CO2 29 12/14/2014   TSH 0.91 10/08/2011   PSA 1.31 03/22/2010   HGBA1C 6.6* 12/14/2014   MICROALBUR 38.7* 11/24/2013    Dg Lumbar Spine Complete  01/12/2014  CLINICAL DATA:  One week history of low back pain. No known injuries. EXAM: LUMBAR SPINE - COMPLETE 4+ VIEW COMPARISON:  None. FINDINGS: Five non rib-bearing lumbar vertebrae with anatomic alignment. Straightening of the usual lumbar lordosis. No  fractures. Mild disc space narrowing and endplate hypertrophic changes at L3-4 and L4-5. Bridging osteophytes at L4-5. No pars defects. Right-sided facet degenerative changes at L4-5. Sacroiliac joints intact with mild degenerative changes. 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 Right-sided facet degenerative changes at L4-5. Straightening of the usual lordosis may reflect positioning or spasm. Electronically Signed   By: Evangeline Dakin M.D.   On: 01/12/2014 14:53    Assessment & Plan:   Diagnoses and all orders for this visit:  Idiopathic gout, unspecified chronicity, unspecified site  DM (diabetes mellitus), type 2 with peripheral vascular complications ([place])  Atherosclerosis of coronary artery bypass graft of native heart without angina pectoris  Dyslipidemia  I have discontinued Mr. Fornash promethazine-codeine and azithromycin. I  am also having him maintain his aspirin, Cholecalciferol, benazepril, albuterol, beclomethasone, tadalafil, indomethacin, zolpidem, promethazine, meloxicam, Vitamin B-12, ranitidine, amLODipine, loperamide, furosemide, omeprazole, metFORMIN, carvedilol, pravastatin, and hydrALAZINE.  No orders of the defined types were placed in this encounter.     Follow-up: Return in about 4 months (around 08/13/2015) for a follow-up visit.  Walker Kehr, MD

## 2015-04-15 NOTE — Assessment & Plan Note (Signed)
Pravachol

## 2015-05-20 DIAGNOSIS — E119 Type 2 diabetes mellitus without complications: Secondary | ICD-10-CM | POA: Diagnosis not present

## 2015-07-22 ENCOUNTER — Emergency Department (HOSPITAL_COMMUNITY): Payer: Medicare Other

## 2015-07-22 ENCOUNTER — Encounter (HOSPITAL_COMMUNITY): Payer: Self-pay | Admitting: Emergency Medicine

## 2015-07-22 ENCOUNTER — Observation Stay (HOSPITAL_COMMUNITY)
Admission: EM | Admit: 2015-07-22 | Discharge: 2015-07-26 | Disposition: A | Discharge status: Home / Self Care | Payer: Medicare Other | Attending: Internal Medicine | Admitting: Internal Medicine

## 2015-07-22 DIAGNOSIS — I10 Essential (primary) hypertension: Secondary | ICD-10-CM | POA: Diagnosis not present

## 2015-07-22 DIAGNOSIS — R2689 Other abnormalities of gait and mobility: Secondary | ICD-10-CM | POA: Insufficient documentation

## 2015-07-22 DIAGNOSIS — N3000 Acute cystitis without hematuria: Secondary | ICD-10-CM

## 2015-07-22 DIAGNOSIS — E1151 Type 2 diabetes mellitus with diabetic peripheral angiopathy without gangrene: Secondary | ICD-10-CM | POA: Diagnosis not present

## 2015-07-22 DIAGNOSIS — J441 Chronic obstructive pulmonary disease with (acute) exacerbation: Secondary | ICD-10-CM | POA: Diagnosis not present

## 2015-07-22 DIAGNOSIS — R0609 Other forms of dyspnea: Secondary | ICD-10-CM | POA: Diagnosis present

## 2015-07-22 DIAGNOSIS — M1990 Unspecified osteoarthritis, unspecified site: Secondary | ICD-10-CM | POA: Diagnosis not present

## 2015-07-22 DIAGNOSIS — E785 Hyperlipidemia, unspecified: Secondary | ICD-10-CM | POA: Diagnosis present

## 2015-07-22 DIAGNOSIS — I25810 Atherosclerosis of coronary artery bypass graft(s) without angina pectoris: Secondary | ICD-10-CM | POA: Diagnosis not present

## 2015-07-22 DIAGNOSIS — Z791 Long term (current) use of non-steroidal anti-inflammatories (NSAID): Secondary | ICD-10-CM | POA: Diagnosis not present

## 2015-07-22 DIAGNOSIS — K921 Melena: Secondary | ICD-10-CM | POA: Insufficient documentation

## 2015-07-22 DIAGNOSIS — Z7982 Long term (current) use of aspirin: Secondary | ICD-10-CM | POA: Insufficient documentation

## 2015-07-22 DIAGNOSIS — Z79899 Other long term (current) drug therapy: Secondary | ICD-10-CM | POA: Insufficient documentation

## 2015-07-22 DIAGNOSIS — R0602 Shortness of breath: Secondary | ICD-10-CM

## 2015-07-22 DIAGNOSIS — K219 Gastro-esophageal reflux disease without esophagitis: Secondary | ICD-10-CM | POA: Insufficient documentation

## 2015-07-22 DIAGNOSIS — R531 Weakness: Principal | ICD-10-CM

## 2015-07-22 DIAGNOSIS — F411 Generalized anxiety disorder: Secondary | ICD-10-CM | POA: Insufficient documentation

## 2015-07-22 DIAGNOSIS — I2510 Atherosclerotic heart disease of native coronary artery without angina pectoris: Secondary | ICD-10-CM | POA: Diagnosis present

## 2015-07-22 DIAGNOSIS — M109 Gout, unspecified: Secondary | ICD-10-CM | POA: Insufficient documentation

## 2015-07-22 DIAGNOSIS — N529 Male erectile dysfunction, unspecified: Secondary | ICD-10-CM | POA: Insufficient documentation

## 2015-07-22 DIAGNOSIS — E119 Type 2 diabetes mellitus without complications: Secondary | ICD-10-CM | POA: Diagnosis not present

## 2015-07-22 DIAGNOSIS — Z7984 Long term (current) use of oral hypoglycemic drugs: Secondary | ICD-10-CM | POA: Insufficient documentation

## 2015-07-22 DIAGNOSIS — N390 Urinary tract infection, site not specified: Secondary | ICD-10-CM | POA: Diagnosis not present

## 2015-07-22 DIAGNOSIS — R42 Dizziness and giddiness: Secondary | ICD-10-CM | POA: Diagnosis not present

## 2015-07-22 LAB — CBC WITH DIFFERENTIAL/PLATELET
BASOS PCT: 0 %
Basophils Absolute: 0 10*3/uL (ref 0.0–0.1)
EOS ABS: 0 10*3/uL (ref 0.0–0.7)
Eosinophils Relative: 0 %
HCT: 31.9 % — ABNORMAL LOW (ref 39.0–52.0)
Hemoglobin: 9.9 g/dL — ABNORMAL LOW (ref 13.0–17.0)
Lymphocytes Relative: 11 %
Lymphs Abs: 1.6 10*3/uL (ref 0.7–4.0)
MCH: 21.5 pg — AB (ref 26.0–34.0)
MCHC: 31 g/dL (ref 30.0–36.0)
MCV: 69.2 fL — ABNORMAL LOW (ref 78.0–100.0)
MONO ABS: 1.6 10*3/uL — AB (ref 0.1–1.0)
Monocytes Relative: 11 %
NEUTROS ABS: 10.9 10*3/uL — AB (ref 1.7–7.7)
Neutrophils Relative %: 78 %
PLATELETS: 251 10*3/uL (ref 150–400)
RBC: 4.61 MIL/uL (ref 4.22–5.81)
RDW: 16.8 % — ABNORMAL HIGH (ref 11.5–15.5)
WBC: 14.1 10*3/uL — ABNORMAL HIGH (ref 4.0–10.5)

## 2015-07-22 LAB — URINALYSIS, ROUTINE W REFLEX MICROSCOPIC
BILIRUBIN URINE: NEGATIVE
GLUCOSE, UA: NEGATIVE mg/dL
HGB URINE DIPSTICK: NEGATIVE
Ketones, ur: NEGATIVE mg/dL
Nitrite: POSITIVE — AB
PH: 6 (ref 5.0–8.0)
Protein, ur: 100 mg/dL — AB
SPECIFIC GRAVITY, URINE: 1.01 (ref 1.005–1.030)

## 2015-07-22 LAB — COMPREHENSIVE METABOLIC PANEL
ALK PHOS: 68 U/L (ref 38–126)
ALT: 14 U/L — ABNORMAL LOW (ref 17–63)
ANION GAP: 12 (ref 5–15)
AST: 20 U/L (ref 15–41)
Albumin: 4 g/dL (ref 3.5–5.0)
BUN: 9 mg/dL (ref 6–20)
CALCIUM: 9.4 mg/dL (ref 8.9–10.3)
CO2: 26 mmol/L (ref 22–32)
Chloride: 102 mmol/L (ref 101–111)
Creatinine, Ser: 1.2 mg/dL (ref 0.61–1.24)
GFR calc non Af Amer: 58 mL/min — ABNORMAL LOW (ref >60)
Glucose, Bld: 119 mg/dL — ABNORMAL HIGH (ref 65–99)
Potassium: 3.5 mmol/L (ref 3.5–5.1)
SODIUM: 140 mmol/L (ref 135–145)
Total Bilirubin: 0.7 mg/dL (ref 0.3–1.2)
Total Protein: 7.3 g/dL (ref 6.5–8.1)

## 2015-07-22 LAB — FOLATE: Folate: 6.8 ng/mL (ref >5.9)

## 2015-07-22 LAB — RETICULOCYTES
RBC.: 4.34 MIL/uL (ref 4.22–5.81)
RETIC COUNT ABSOLUTE: 60.8 10*3/uL (ref 19.0–186.0)
RETIC CT PCT: 1.4 % (ref 0.4–3.1)

## 2015-07-22 LAB — URINE MICROSCOPIC-ADD ON: Squamous Epithelial / LPF: NONE SEEN

## 2015-07-22 LAB — I-STAT TROPONIN, ED: TROPONIN I, POC: 0.01 ng/mL (ref 0.00–0.08)

## 2015-07-22 LAB — PROTIME-INR
INR: 1.2 (ref 0.00–1.49)
Prothrombin Time: 15.4 seconds — ABNORMAL HIGH (ref 11.6–15.2)

## 2015-07-22 LAB — IRON AND TIBC
Iron: 24 ug/dL — ABNORMAL LOW (ref 45–182)
Saturation Ratios: 5 % — ABNORMAL LOW (ref 17.9–39.5)
TIBC: 445 ug/dL (ref 250–450)
UIBC: 421 ug/dL

## 2015-07-22 LAB — VITAMIN B12: Vitamin B-12: 230 pg/mL (ref 180–914)

## 2015-07-22 LAB — GLUCOSE, CAPILLARY
GLUCOSE-CAPILLARY: 177 mg/dL — AB (ref 65–99)
Glucose-Capillary: 107 mg/dL — ABNORMAL HIGH (ref 65–99)

## 2015-07-22 LAB — FERRITIN: FERRITIN: 10 ng/mL — AB (ref 24–336)

## 2015-07-22 LAB — CBG MONITORING, ED: GLUCOSE-CAPILLARY: 123 mg/dL — AB (ref 65–99)

## 2015-07-22 LAB — BRAIN NATRIURETIC PEPTIDE: B NATRIURETIC PEPTIDE 5: 126.1 pg/mL — AB (ref 0.0–100.0)

## 2015-07-22 LAB — APTT: APTT: 35 s (ref 24–37)

## 2015-07-22 MED ORDER — CARVEDILOL 25 MG PO TABS
25.0000 mg | ORAL_TABLET | Freq: Two times a day (BID) | ORAL | Status: DC
  Administered 2015-07-22 – 2015-07-26 (×8): 25 mg via ORAL
  Filled 2015-07-22 (×8): qty 1

## 2015-07-22 MED ORDER — ZOLPIDEM TARTRATE 5 MG PO TABS
5.0000 mg | ORAL_TABLET | Freq: Every evening | ORAL | Status: DC | PRN
  Administered 2015-07-23 – 2015-07-25 (×2): 5 mg via ORAL
  Filled 2015-07-22 (×2): qty 1

## 2015-07-22 MED ORDER — HYDRALAZINE HCL 50 MG PO TABS
50.0000 mg | ORAL_TABLET | Freq: Three times a day (TID) | ORAL | Status: DC
  Administered 2015-07-23 – 2015-07-26 (×10): 50 mg via ORAL
  Filled 2015-07-22 (×10): qty 1

## 2015-07-22 MED ORDER — ASPIRIN 81 MG PO TBEC: 81.0000 mg | DELAYED_RELEASE_TABLET | Freq: Every day | ORAL | Status: DC

## 2015-07-22 MED ORDER — DEXTROSE 5 % IV SOLN
1.0000 g | INTRAVENOUS | Status: DC
  Administered 2015-07-23 – 2015-07-25 (×3): 1 g via INTRAVENOUS
  Filled 2015-07-22 (×4): qty 10

## 2015-07-22 MED ORDER — ASPIRIN 81 MG PO CHEW
243.0000 mg | CHEWABLE_TABLET | Freq: Once | ORAL | Status: AC
  Administered 2015-07-22: 243 mg via ORAL
  Filled 2015-07-22: qty 3

## 2015-07-22 MED ORDER — PROMETHAZINE HCL 25 MG PO TABS: 25.0000 mg | ORAL_TABLET | Freq: Three times a day (TID) | ORAL | Status: DC | PRN

## 2015-07-22 MED ORDER — PRAVASTATIN SODIUM 40 MG PO TABS
80.0000 mg | ORAL_TABLET | Freq: Every day | ORAL | Status: DC
  Administered 2015-07-22 – 2015-07-25 (×4): 80 mg via ORAL
  Filled 2015-07-22 (×5): qty 2

## 2015-07-22 MED ORDER — SODIUM CHLORIDE 0.9 % IV SOLN
INTRAVENOUS | Status: DC
  Administered 2015-07-22: 19:00:00 via INTRAVENOUS

## 2015-07-22 MED ORDER — MORPHINE SULFATE (PF) 2 MG/ML IV SOLN: 1.0000 mg | INTRAVENOUS | Status: DC | PRN

## 2015-07-22 MED ORDER — ACETAMINOPHEN 650 MG RE SUPP: 650.0000 mg | Freq: Four times a day (QID) | RECTAL | Status: DC | PRN

## 2015-07-22 MED ORDER — MELOXICAM 7.5 MG PO TABS
15.0000 mg | ORAL_TABLET | Freq: Every day | ORAL | Status: DC
  Administered 2015-07-23 – 2015-07-26 (×4): 15 mg via ORAL
  Filled 2015-07-22 (×3): qty 2
  Filled 2015-07-22: qty 1
  Filled 2015-07-22: qty 2

## 2015-07-22 MED ORDER — ASPIRIN 81 MG PO CHEW
81.0000 mg | CHEWABLE_TABLET | Freq: Every day | ORAL | Status: DC
  Administered 2015-07-23 – 2015-07-26 (×4): 81 mg via ORAL
  Filled 2015-07-22 (×5): qty 1

## 2015-07-22 MED ORDER — LORAZEPAM 2 MG/ML IJ SOLN
1.0000 mg | Freq: Once | INTRAMUSCULAR | Status: AC | PRN
  Administered 2015-07-23: 1 mg via INTRAVENOUS
  Filled 2015-07-22: qty 1

## 2015-07-22 MED ORDER — BENAZEPRIL HCL 40 MG PO TABS
40.0000 mg | ORAL_TABLET | Freq: Every day | ORAL | Status: DC
  Administered 2015-07-23 – 2015-07-26 (×4): 40 mg via ORAL
  Filled 2015-07-22 (×5): qty 1

## 2015-07-22 MED ORDER — DEXTROSE 5 % IV SOLN
1.0000 g | Freq: Once | INTRAVENOUS | Status: AC
  Administered 2015-07-22: 1 g via INTRAVENOUS
  Filled 2015-07-22: qty 10

## 2015-07-22 MED ORDER — FUROSEMIDE 20 MG PO TABS
20.0000 mg | ORAL_TABLET | Freq: Every day | ORAL | Status: DC
  Administered 2015-07-22 – 2015-07-26 (×5): 20 mg via ORAL
  Filled 2015-07-22 (×6): qty 1

## 2015-07-22 MED ORDER — HYDROCODONE-ACETAMINOPHEN 5-325 MG PO TABS: 1.0000 | ORAL_TABLET | ORAL | Status: DC | PRN

## 2015-07-22 MED ORDER — ACETAMINOPHEN 325 MG PO TABS
650.0000 mg | ORAL_TABLET | Freq: Four times a day (QID) | ORAL | Status: DC | PRN
  Administered 2015-07-23 – 2015-07-24 (×3): 650 mg via ORAL
  Filled 2015-07-22 (×3): qty 2

## 2015-07-22 MED ORDER — AMLODIPINE BESYLATE 10 MG PO TABS
10.0000 mg | ORAL_TABLET | Freq: Every day | ORAL | Status: DC
  Administered 2015-07-23 – 2015-07-26 (×4): 10 mg via ORAL
  Filled 2015-07-22 (×4): qty 1

## 2015-07-22 MED ORDER — SODIUM CHLORIDE 0.9% FLUSH
3.0000 mL | Freq: Two times a day (BID) | INTRAVENOUS | Status: DC
  Administered 2015-07-23 – 2015-07-25 (×6): 3 mL via INTRAVENOUS

## 2015-07-22 MED ORDER — PANTOPRAZOLE SODIUM 40 MG PO TBEC: 40.0000 mg | DELAYED_RELEASE_TABLET | Freq: Every day | ORAL | Status: DC

## 2015-07-22 MED ORDER — PANTOPRAZOLE SODIUM 40 MG PO TBEC
40.0000 mg | DELAYED_RELEASE_TABLET | Freq: Every day | ORAL | Status: DC
  Administered 2015-07-23 – 2015-07-26 (×4): 40 mg via ORAL
  Filled 2015-07-22 (×4): qty 1

## 2015-07-22 MED ORDER — CARVEDILOL 25 MG PO TABS: 25.0000 mg | ORAL_TABLET | Freq: Two times a day (BID) | ORAL | Status: DC

## 2015-07-22 NOTE — Progress Notes (Signed)
Pharmacy Antibiotic Note  Harold Pittman is a 75 y.o. male admitted on 07/22/2015 with generalized weakness. WBC = 14.1, afebrile and urinalysis noted nitrite positive.  Pharmacy has been consulted for rocephin dosing for UTI. -rocephin 1gm given today at about 4pm  Plan: -Rocephin 1gm IV q24hr -no dose adjustments anticipated -Will sign off. Please contact pharmacy with any other needs.   Temp (24hrs), Avg:99.5 F (37.5 C), Min:99.5 F (37.5 C), Max:99.5 F (37.5 C)   Recent Labs Lab 07/22/15 1317  WBC 14.1*  CREATININE 1.20    CrCl cannot be calculated (Unknown ideal weight.).    No Known Allergies  Antimicrobials this admission: 4/14 rocephin>>   Microbiology results: 4/14 urinr  Thank you for allowing pharmacy to be a part of this patient's care.  Hildred Laser, Pharm D 07/22/2015 5:27 PM

## 2015-07-22 NOTE — ED Provider Notes (Signed)
CSN: [id number]     Arrival date & time 07/22/15  1206 History   First MD Initiated Contact with Patient 07/22/15 1240     Chief Complaint  Patient presents with  . Weakness     (Consider location/radiation/quality/duration/timing/severity/associated sxs/prior Treatment) HPI Patient presents with shortness of breath on exertion and weakness upon waking this morning at 0755. Last seen normal yesterday evening when he went to sleep at 11 PM. States he is having trouble ambulating. He denies any recent falls, headache or neck pain. Denies any cough or chest pain. Patient states he sleeps with multiple pillows. No new lower extremity swelling for pain. Past Medical History  Diagnosis Date  . Chronic airway obstruction, not elsewhere classified   . Personal history of tobacco use, presenting hazards to health   . Unspecified essential hypertension   . Coronary atherosclerosis of artery bypass graft   . Other and unspecified hyperlipidemia   . Type II or unspecified type diabetes mellitus without mention of complication, not stated as uncontrolled   . Esophageal reflux   . Blood in stool   . Other specified disorder of rectum and anus   . ED (erectile dysfunction)   . Osteoarthrosis, unspecified whether generalized or localized, unspecified site   . Gout, unspecified   . Anxiety state, unspecified    Past Surgical History  Procedure Laterality Date  . Coronary artery bypass graft  97    LIMA to LAD, sequential saphenous vein graft to the first and second diagonal, sequential saphenous vein graft to the intermediate OM and circumflex and SVG to RCA  . Rotator cuff repair    . Knee surgery      BILATERAL   Family History  Problem Relation Age of Onset  . Depression Sister   . Heart disease Sister   . Colon cancer Neg Hx   . Hypertension Other   . Coronary artery disease Other     Male 1st degree relative <50  . Heart disease Mother 64    CAD  . Mental illness Father      Alzheimer's   Social History  Substance Use Topics  . Smoking status: Former Research scientist (life sciences)  . Smokeless tobacco: Never Used     Comment: Stopped 1997  . Alcohol Use: No     Comment: Stopped 1997    Review of Systems  Constitutional: Negative for fever and chills.  Eyes: Negative for visual disturbance.  Respiratory: Positive for shortness of breath. Negative for cough.   Cardiovascular: Negative for chest pain, palpitations and leg swelling.  Gastrointestinal: Negative for nausea, vomiting, abdominal pain and diarrhea.  Musculoskeletal: Positive for gait problem. Negative for myalgias, back pain, arthralgias, neck pain and neck stiffness.  Skin: Negative for rash and wound.  Neurological: Positive for weakness. Negative for dizziness, syncope, speech difficulty, light-headedness and headaches.  All other systems reviewed and are negative.     Allergies  Review of patient's allergies indicates no known allergies.  Home Medications   Prior to Admission medications   Medication Sig Start Date End Date Taking? Authorizing Provider  amLODipine (NORVASC) 10 MG tablet Take 1 tablet by mouth  daily 01/17/15  Yes Aleksei Plotnikov V, MD  aspirin 81 MG EC tablet Take 81 mg by mouth daily.     Yes Historical Provider, MD  benazepril (LOTENSIN) 40 MG tablet 1 tab po qd Patient taking differently: Take 40 mg by mouth daily.  07/19/10  Yes Sherren Mocha, MD  carvedilol (COREG) 25 MG tablet  Take 1 tablet (25 mg total) by mouth 2 (two) times daily with a meal. 02/24/15  Yes Aleksei Plotnikov V, MD  Cholecalciferol 1000 UNITS tablet Take 2,000 Units by mouth daily.     Yes Historical Provider, MD  Cyanocobalamin (VITAMIN B-12) 1000 MCG SUBL Place 1 tablet (1,000 mcg total) under the tongue daily. 03/18/14  Yes Aleksei Plotnikov V, MD  furosemide (LASIX) 20 MG tablet Take 1 tablet by mouth  every morning 01/17/15  Yes Aleksei Plotnikov V, MD  hydrALAZINE (APRESOLINE) 50 MG tablet Take 1 tablet (50 mg  total) by mouth 3 (three) times daily. 02/24/15  Yes Aleksei Plotnikov V, MD  indomethacin (INDOCIN) 25 MG capsule TAKE TWO CAPSULES BY MOUTH THREE TIMES DAILY AS NEEDED FOR  GOUT  ATTACKS 12/11/11  Yes Aleksei Plotnikov V, MD  loperamide (IMODIUM) 2 MG capsule Take 1 capsule by mouth 4  times daily as needed for  diarrhea or loose stools 01/17/15  Yes Aleksei Plotnikov V, MD  meloxicam (MOBIC) 15 MG tablet Take 1 tablet (15 mg total) by mouth daily. 01/12/14  Yes Lyndal Pulley, DO  metFORMIN (GLUCOPHAGE) 500 MG tablet Take 1 tablet by mouth  twice a day 01/17/15  Yes Aleksei Plotnikov V, MD  omeprazole (PRILOSEC) 20 MG capsule Take 1 capsule by mouth  daily 01/17/15  Yes Aleksei Plotnikov V, MD  pravastatin (PRAVACHOL) 80 MG tablet Take 1 tablet (80 mg total) by mouth at bedtime. 02/24/15  Yes Aleksei Plotnikov V, MD  promethazine (PHENERGAN) 25 MG tablet Take 1 tablet (25 mg total) by mouth every 8 (eight) hours as needed for nausea or vomiting. 06/16/13  Yes Aleksei Plotnikov V, MD  ranitidine (ZANTAC) 300 MG tablet Take 1 tablet by mouth  every evening 01/17/15  Yes Aleksei Plotnikov V, MD  tadalafil (CIALIS) 20 MG tablet Take as directed 09/26/10  Yes Noralee Space, MD  zolpidem (AMBIEN) 5 MG tablet Take 1 tablet (5 mg total) by mouth at bedtime as needed for sleep. 03/25/13  Yes Biagio Borg, MD  albuterol (PROAIR HFA) 108 (90 BASE) MCG/ACT inhaler Inhale 2 puffs into the lungs 2 (two) times daily. Patient not taking: Reported on 07/22/2015 09/26/10   Noralee Space, MD  beclomethasone (QVAR) 80 MCG/ACT inhaler Inhale 2 puffs into the lungs 2 (two) times daily. Patient not taking: Reported on 07/22/2015 09/26/10   Noralee Space, MD   BP 103/48 mmHg  Pulse 70  Temp(Src) 98.6 F (37 C) (Oral)  Resp 20  Ht 5\' 9"  (1.753 m)  Wt 188 lb 4.4 oz (85.4 kg)  BMI 27.79 kg/m2  SpO2 97% Physical Exam  Constitutional: He is oriented to person, place, and time. He appears well-developed and well-nourished.  No distress.  HENT:  Head: Normocephalic and atraumatic.  Mouth/Throat: Oropharynx is clear and moist. No oropharyngeal exudate.  Eyes: EOM are normal. Pupils are equal, round, and reactive to light.  Neck: Normal range of motion. Neck supple.  No posterior midline cervical tenderness to palpation. No meningismus.  Cardiovascular: Normal rate and regular rhythm.  Exam reveals no gallop and no friction rub.   No murmur heard. Pulmonary/Chest: Effort normal and breath sounds normal. No respiratory distress. He has no wheezes. He has no rales. He exhibits no tenderness.  Abdominal: Soft. Bowel sounds are normal. He exhibits no distension and no mass. There is no tenderness. There is no rebound and no guarding.  Musculoskeletal: Normal range of motion. He exhibits no edema or tenderness.  No lower extremities swelling, asymmetry or tenderness. No midline thoracic or lumbar tenderness. No CVA tenderness bilaterally.  Neurological: He is alert and oriented to person, place, and time.  Patient is alert and oriented x3 with clear, goal oriented speech. Patient has 5/5 motor in bilateral upper and left lower extremity. 4/5 motor in the right lower extremity Sensation is intact to light touch. Bilateral finger-to-nose is normal with no signs of dysmetria.  Skin: Skin is warm and dry. No rash noted. No erythema.  Psychiatric: He has a normal mood and affect. His behavior is normal.  Nursing note and vitals reviewed.   ED Course  Procedures (including critical care time) Labs Review Labs Reviewed  URINE CULTURE - Abnormal; Notable for the following:    Culture 1,000 COLONIES/mL INSIGNIFICANT GROWTH (*)    All other components within normal limits  CBC WITH DIFFERENTIAL/PLATELET - Abnormal; Notable for the following:    WBC 14.1 (*)    Hemoglobin 9.9 (*)    HCT 31.9 (*)    MCV 69.2 (*)    MCH 21.5 (*)    RDW 16.8 (*)    Neutro Abs 10.9 (*)    Monocytes Absolute 1.6 (*)    All other components  within normal limits  COMPREHENSIVE METABOLIC PANEL - Abnormal; Notable for the following:    Glucose, Bld 119 (*)    ALT 14 (*)    GFR calc non Af Amer 58 (*)    All other components within normal limits  BRAIN NATRIURETIC PEPTIDE - Abnormal; Notable for the following:    B Natriuretic Peptide 126.1 (*)    All other components within normal limits  PROTIME-INR - Abnormal; Notable for the following:    Prothrombin Time 15.4 (*)    All other components within normal limits  URINALYSIS, ROUTINE W REFLEX MICROSCOPIC (NOT AT [facility]) - Abnormal; Notable for the following:    Protein, ur 100 (*)    Nitrite POSITIVE (*)    Leukocytes, UA TRACE (*)    All other components within normal limits  URINE MICROSCOPIC-ADD ON - Abnormal; Notable for the following:    Bacteria, UA MANY (*)    All other components within normal limits  COMPREHENSIVE METABOLIC PANEL - Abnormal; Notable for the following:    Potassium 3.0 (*)    Glucose, Bld 151 (*)    Creatinine, Ser 1.26 (*)    ALT 12 (*)    GFR calc non Af Amer 54 (*)    All other components within normal limits  CBC - Abnormal; Notable for the following:    WBC 12.4 (*)    Hemoglobin 9.2 (*)    HCT 30.9 (*)    MCV 69.0 (*)    MCH 20.5 (*)    MCHC 29.8 (*)    RDW 16.8 (*)    All other components within normal limits  HEMOGLOBIN A1C - Abnormal; Notable for the following:    Hgb A1c MFr Bld 6.9 (*)    All other components within normal limits  IRON AND TIBC - Abnormal; Notable for the following:    Iron 24 (*)    Saturation Ratios 5 (*)    All other components within normal limits  FERRITIN - Abnormal; Notable for the following:    Ferritin 10 (*)    All other components within normal limits  LIPID PANEL - Abnormal; Notable for the following:    HDL 31 (*)    All other components within normal limits  GLUCOSE, CAPILLARY - Abnormal; Notable for the following:    Glucose-Capillary 177 (*)    All other components within normal limits   GLUCOSE, CAPILLARY - Abnormal; Notable for the following:    Glucose-Capillary 107 (*)    All other components within normal limits  PROTIME-INR - Abnormal; Notable for the following:    Prothrombin Time 16.8 (*)    All other components within normal limits  APTT - Abnormal; Notable for the following:    aPTT 40 (*)    All other components within normal limits  GLUCOSE, CAPILLARY - Abnormal; Notable for the following:    Glucose-Capillary 142 (*)    All other components within normal limits  GLUCOSE, CAPILLARY - Abnormal; Notable for the following:    Glucose-Capillary 158 (*)    All other components within normal limits  BASIC METABOLIC PANEL - Abnormal; Notable for the following:    Potassium 3.2 (*)    Glucose, Bld 138 (*)    Creatinine, Ser 1.44 (*)    Calcium 8.6 (*)    GFR calc non Af Amer 46 (*)    GFR calc Af Amer 54 (*)    All other components within normal limits  CBC - Abnormal; Notable for the following:    WBC 17.6 (*)    RBC 4.14 (*)    Hemoglobin 8.6 (*)    HCT 28.6 (*)    MCV 69.1 (*)    MCH 20.8 (*)    RDW 17.0 (*)    All other components within normal limits  GLUCOSE, CAPILLARY - Abnormal; Notable for the following:    Glucose-Capillary 127 (*)    All other components within normal limits  GLUCOSE, CAPILLARY - Abnormal; Notable for the following:    Glucose-Capillary 144 (*)    All other components within normal limits  GLUCOSE, CAPILLARY - Abnormal; Notable for the following:    Glucose-Capillary 228 (*)    All other components within normal limits  CBG MONITORING, ED - Abnormal; Notable for the following:    Glucose-Capillary 123 (*)    All other components within normal limits  CULTURE, BLOOD (SINGLE)  APTT  VITAMIN B12  FOLATE  RETICULOCYTES  LACTIC ACID, PLASMA  LACTIC ACID, PLASMA  LACTIC ACID, PLASMA  LACTIC ACID, PLASMA  PROCALCITONIN  I-STAT TROPOININ, ED    Imaging Review Mr Brain Wo Contrast  07/23/2015  CLINICAL DATA:   Increased generalized weakness. Shortness of breath on exertion. Dizziness. EXAM: MRI HEAD WITHOUT CONTRAST TECHNIQUE: Multiplanar, multiecho pulse sequences of the brain and surrounding structures were obtained without intravenous contrast. COMPARISON:  CT head 07/22/2015. FINDINGS: No acute stroke or hemorrhage. Global atrophy with hydrocephalus ex vacuo. No mass lesion, or extra-axial fluid. Minor white matter disease, nonspecific. Flow voids are maintained. No midline abnormality. No extracranial soft tissue abnormality of significance. Visualized upper cervical region unremarkable. IMPRESSION: Atrophy. Minor white matter disease. No acute intracranial findings. Specifically, no cause seen for lower extremity weakness. Electronically Signed   By: Staci Righter M.D.   On: 07/23/2015 10:31   Dg Chest Port 1 View  07/23/2015  CLINICAL DATA:  75 year old male with history of shortness of breath. Prior history of open heart surgery. EXAM: PORTABLE CHEST 1 VIEW COMPARISON:  Chest x-ray 07/22/2015. FINDINGS: Lung volumes are normal. No consolidative airspace disease. No pleural effusions. No pneumothorax. No pulmonary nodule or mass noted. Pulmonary vasculature and the cardiomediastinal silhouette are within normal limits. Atherosclerosis in the thoracic aorta. Status post median sternotomy for  CABG. IMPRESSION: 1. No radiographic evidence of acute cardiopulmonary disease. 2. Atherosclerosis. Electronically Signed   By: Vinnie Langton M.D.   On: 07/23/2015 14:44   I have personally reviewed and evaluated these images and lab results as part of my medical decision-making.   EKG Interpretation   Date/Time:  Friday July 22 2015 12:18:55 EDT Ventricular Rate:  82 PR Interval:  187 QRS Duration: 90 QT Interval:  375 QTC Calculation: 438 R Axis:   53 Text Interpretation:  Sinus rhythm ED PHYSICIAN INTERPRETATION AVAILABLE  IN [place] Confirmed by TEST, Record ([id number]) on 07/23/2015 9:08:21  AM       MDM   Final diagnoses:  Weakness  UTI (lower urinary tract infection)    Patient with evidence of urinary tract infection. Initiated Rocephin in the emergency department. Discussed with hospitalist and we'll admit.    Julianne Rice, MD 07/24/15 1538

## 2015-07-22 NOTE — ED Notes (Signed)
Got up this am 0755 feeling weak and has been stumbling around and some sob, more so than usual, no cpor h/a,  States loses his balance denies dizziness

## 2015-07-22 NOTE — H&P (Signed)
[facility] History and Physical  DARCELL GERBINO [id number] DOB: 1940/06/15 DOA: 07/22/2015  Referring physician:  PCP: Walker Kehr, MD   Chief Complaint: Generalized weakness  HPI: Harold Pittman is a 75 y.o. male with a history of CAD status post CABG, diabetes type 2, osteoarthritis,brought to the emergency department, due to increased generalized weakness with shortness of breath on exertion and mild dizziness upon waking up this morning. Last seen normal yesterday evening around 11 PM.he states that he could not move, because his legs "were very weak". Did not fall.  Denies rhinorrhea or productive sputum. Denies any hemoptysis. Denies fevers, chills, night sweats or mucositis. Denies any chest pain, chest wall pain or palpitations. He denies any abdominal pain. Appetite is normal and drinks plenty fluids. He denies dysuria but reports urinary incontinence, hesitancy and frequency for a few months..Denies lower extremity swelling, but does complain of some arthritic pain controlled with meds. No confusion was reported. He denies any vision changes or double vision. He denies any headaches.Denies any sick contacts or recent long distance travels. No recent hospitalizations.  At the ED, his Vitals are stable, OSATs normal on RA.  Tmax 99.5  UA remarkable for positive nitrities, CBC remarkable for a white count of 14.1, hemoglobin 9.9, normal platelets.and CMET essentially unremarkable. with neg Tn at 0.1. BNP 126.1 EKG NSR QTC 438.  A chest x ray NAD. CT head no acute intracranial abnormalities.    Review of Systems:   See HPI for significant positives. All other systems were reviewed and are negative.  Past Medical History  Diagnosis Date  . Chronic airway obstruction, not elsewhere classified   . Personal history of tobacco use, presenting hazards to health   . Unspecified essential hypertension   . Coronary atherosclerosis of artery bypass graft   . Other and  unspecified hyperlipidemia   . Type II or unspecified type diabetes mellitus without mention of complication, not stated as uncontrolled   . Esophageal reflux   . Blood in stool   . Other specified disorder of rectum and anus   . ED (erectile dysfunction)   . Osteoarthrosis, unspecified whether generalized or localized, unspecified site   . Gout, unspecified   . Anxiety state, unspecified    Past Surgical History  Procedure Laterality Date  . Coronary artery bypass graft  97    LIMA to LAD, sequential saphenous vein graft to the first and second diagonal, sequential saphenous vein graft to the intermediate OM and circumflex and SVG to RCA  . Rotator cuff repair    . Knee surgery      BILATERAL   Social History:  reports that he has quit smoking. He has never used smokeless tobacco. He reports that he does not drink alcohol or use illicit drugs.  No Known Allergies  Family History  Problem Relation Age of Onset  . Depression Sister   . Heart disease Sister   . Colon cancer Neg Hx   . Hypertension Other   . Coronary artery disease Other     Male 1st degree relative <50  . Heart disease Mother 1    CAD  . Mental illness Father     Alzheimer's     Prior to Admission medications   Medication Sig Start Date End Date Taking? Authorizing Provider  amLODipine (NORVASC) 10 MG tablet Take 1 tablet by mouth  daily 01/17/15  Yes Aleksei Plotnikov V, MD  aspirin 81 MG EC tablet Take 81 mg by mouth daily.  Yes Historical Provider, MD  benazepril (LOTENSIN) 40 MG tablet 1 tab po qd Patient taking differently: Take 40 mg by mouth daily.  07/19/10  Yes Sherren Mocha, MD  carvedilol (COREG) 25 MG tablet Take 1 tablet (25 mg total) by mouth 2 (two) times daily with a meal. 02/24/15  Yes Aleksei Plotnikov V, MD  Cholecalciferol 1000 UNITS tablet Take 2,000 Units by mouth daily.     Yes Historical Provider, MD  Cyanocobalamin (VITAMIN B-12) 1000 MCG SUBL Place 1 tablet (1,000 mcg total)  under the tongue daily. 03/18/14  Yes Aleksei Plotnikov V, MD  furosemide (LASIX) 20 MG tablet Take 1 tablet by mouth  every morning 01/17/15  Yes Aleksei Plotnikov V, MD  hydrALAZINE (APRESOLINE) 50 MG tablet Take 1 tablet (50 mg total) by mouth 3 (three) times daily. 02/24/15  Yes Aleksei Plotnikov V, MD  indomethacin (INDOCIN) 25 MG capsule TAKE TWO CAPSULES BY MOUTH THREE TIMES DAILY AS NEEDED FOR  GOUT  ATTACKS 12/11/11  Yes Aleksei Plotnikov V, MD  loperamide (IMODIUM) 2 MG capsule Take 1 capsule by mouth 4  times daily as needed for  diarrhea or loose stools 01/17/15  Yes Aleksei Plotnikov V, MD  meloxicam (MOBIC) 15 MG tablet Take 1 tablet (15 mg total) by mouth daily. 01/12/14  Yes Lyndal Pulley, DO  metFORMIN (GLUCOPHAGE) 500 MG tablet Take 1 tablet by mouth  twice a day 01/17/15  Yes Aleksei Plotnikov V, MD  omeprazole (PRILOSEC) 20 MG capsule Take 1 capsule by mouth  daily 01/17/15  Yes Aleksei Plotnikov V, MD  pravastatin (PRAVACHOL) 80 MG tablet Take 1 tablet (80 mg total) by mouth at bedtime. 02/24/15  Yes Aleksei Plotnikov V, MD  promethazine (PHENERGAN) 25 MG tablet Take 1 tablet (25 mg total) by mouth every 8 (eight) hours as needed for nausea or vomiting. 06/16/13  Yes Aleksei Plotnikov V, MD  ranitidine (ZANTAC) 300 MG tablet Take 1 tablet by mouth  every evening 01/17/15  Yes Aleksei Plotnikov V, MD  tadalafil (CIALIS) 20 MG tablet Take as directed 09/26/10  Yes Noralee Space, MD  zolpidem (AMBIEN) 5 MG tablet Take 1 tablet (5 mg total) by mouth at bedtime as needed for sleep. 03/25/13  Yes Biagio Borg, MD  albuterol (PROAIR HFA) 108 (90 BASE) MCG/ACT inhaler Inhale 2 puffs into the lungs 2 (two) times daily. Patient not taking: Reported on 07/22/2015 09/26/10   Noralee Space, MD  beclomethasone (QVAR) 80 MCG/ACT inhaler Inhale 2 puffs into the lungs 2 (two) times daily. Patient not taking: Reported on 07/22/2015 09/26/10   Noralee Space, MD   Physical Exam: Filed Vitals:    07/22/15 1345 07/22/15 1503 07/22/15 1515 07/22/15 1545  BP: 154/74 173/87 154/76 150/77  Pulse: 82 81 83 85  Temp:      TempSrc:      Resp: 21 21 27 20   SpO2: 95% 98% 93% 95%    Wt Readings from Last 3 Encounters:  04/15/15 89.359 kg (197 lb)  03/10/15 89.812 kg (198 lb)  12/14/14 90.266 kg (199 lb)    General: Appears calm and comfortable, chronic ill appearing Eyes:  PERRL, EOMI, normal lids, iris ENT: grossly normal hearing, lips & tongue Neck: no lymphadenopathy, masses or thyromegaly Cardiovascular: regular rate and rythm, no murmurs, rubs or gallops. No lower extremity edema   Respiratory: clear to auscultation bilaterally, no wheezing, rhonhci or rales. Normal respiratory effort. Abdomen: soft,non-tender, normal bowel sounds. Mild suprapubic tenderness on deep palpation.  Skin: no rash or induration seen on limited exam. No open lesions. Musculoskeletal:  grossly normal tone in both upper and lower extremities Psychiatric: grossly normal mood and affect, speech fluent and appropriate Neurologic: CN 2-12 grossly intact, moves all extremities in coordinated fashion.          Labs on Admission:  Basic Metabolic Panel:  Recent Labs Lab 07/22/15 1317  NA 140  K 3.5  CL 102  CO2 26  GLUCOSE 119*  BUN 9  CREATININE 1.20  CALCIUM 9.4    Liver Function Tests:  Recent Labs Lab 07/22/15 1317  AST 20  ALT 14*  ALKPHOS 68  BILITOT 0.7  PROT 7.3  ALBUMIN 4.0   No results for input(s): LIPASE, AMYLASE in the last 168 hours. No results for input(s): AMMONIA in the last 168 hours.  CBC:  Recent Labs Lab 07/22/15 1317  WBC 14.1*  NEUTROABS 10.9*  HGB 9.9*  HCT 31.9*  MCV 69.2*  PLT 251    Cardiac Enzymes: No results for input(s): CKTOTAL, CKMB, CKMBINDEX, TROPONINI in the last 168 hours.  BNP (last 3 results)  Recent Labs  07/22/15 1317  BNP 126.1*    ProBNP (last 3 results) No results for input(s): PROBNP in the last 8760  hours.   Creatinine clearance cannot be calculated (Unknown ideal weight.)  CBG:  Recent Labs Lab 07/22/15 1220  GLUCAP 123*    Radiological Exams on Admission: Ct Head Wo Contrast  07/22/2015  CLINICAL DATA:  Weakness and dizziness for 1 day, unsteady gait, diabetes mellitus, hypertension, coronary artery disease post CABG, former smoker EXAM: CT HEAD WITHOUT CONTRAST TECHNIQUE: Contiguous axial images were obtained from the base of the skull through the vertex without intravenous contrast. COMPARISON:  None FINDINGS: Generalized atrophy. Normal ventricular morphology. No midline shift or mass effect. Otherwise normal appearance of brain parenchyma. No intracranial hemorrhage, mass lesion or evidence acute infarction. No extra-axial fluid collections. Visualized paranasal sinuses and mastoid air cells clear. Osseous structures unremarkable. IMPRESSION: Generalized atrophy. No acute intracranial abnormalities. Electronically Signed   By: Lavonia Dana M.D.   On: 07/22/2015 14:13   Dg Chest Port 1 View  07/22/2015  CLINICAL DATA:  Weakness and dizziness EXAM: PORTABLE CHEST 1 VIEW COMPARISON:  09/26/2010 FINDINGS: Status post CABG. Normal heart size and mediastinal contours. There is no edema, consolidation, effusion, or pneumothorax. IMPRESSION: Stable.  No active disease. Electronically Signed   By: Monte Fantasia M.D.   On: 07/22/2015 13:26    EKG: Independently reviewed.    Assessment/Plan Principal Problem:   Generalized weakness Active Problems:   Dyslipidemia   Essential hypertension   Coronary atherosclerosis of artery bypass graft   Osteoarthritis   DOE (dyspnea on exertion)   DM (diabetes mellitus), type 2 with peripheral vascular complications (HCC)   Weakness   Urinary tract infection  Generalized weakness with dizziness, acute in the setting of UTI, arthritis, deconditioning, anemia. CT head is negative. EKG SR. CXR negative.  Will order MRI rule out any intracranial  abnormalities Tele / Obs Allow permissive HTN for now until MRI returns Hold antihypertensives if BP low less than 100 syst and less than 55 diast -Echo -PT/OT/SLP  lipid panel  Check A1C -Aspirin   UTI suggested by  Nitrites in urine and urinary frequency -  F/u urine culture -  Ceftriaxone 1 g x1 received in ER.  Continue therapy  Type II Diabetes Current blood sugar level is 123 Lab Results  Component Value Date  HGBA1C 6.6* 12/14/2014  Hgb A1C Hold home oral diabetic medications.   SSI Heart healthy carb modified diet.  Leukocytosis, likely related to underlying infection -  abx as above -  Repeat WBC in AM  Symptomatic anemia.  Hemoglobin 9.9 on admission with MCV of 69.2, likely Iron deficient. No apparent blood loss  No indication for transfusion at this time Check in am Add Iron studies  Hypertension BP 142/66 mmHg  Pulse 92  Temp(Src) 99.5 F (37.5 C) (Oral)  Resp 27  SpO2 94% Continue home anti-hypertensive medications after MRI head returns nornmal Add Hydralazine Q6 hours as needed for SBP >160 and /or DBP >110.      Code Status: Full Code DVT Prophylaxis: SCDs for now Family Communication:  No Family at bedside Disposition Plan: Pending Improvement. Admitted for observation in tele bed. Expected LOS 24-48 hrs    [facility] E,PA-C [facility] [email or web address] Password [email or web address]

## 2015-07-22 NOTE — Progress Notes (Signed)
Notified [facility] on call M. Donnal Debar, NP that patient has fever of 101.6. He is (+) for UTI and is on Rocephin. No new orders at this time.

## 2015-07-22 NOTE — Evaluation (Signed)
Physical Therapy Evaluation Patient Details Name: Harold Pittman MRN: [id number] DOB: 05-12-1940 Today's Date: 07/22/2015   History of Present Illness  Patient is a 75 yo male admitted 07/22/15 with RLE weakness, r/o TIA/CVA and UTI.   PMH:  CAD, CABG, DM, OA, HTN  Clinical Impression  Patient presents with problems listed below.  Will benefit from acute PT to maximize functional mobility prior to discharge.  Patient was independent pta.  Required mod assist with gait today.  Recommend Inpatient Rehab consult with goal to reach Mod I functional level to return home safely.    Follow Up Recommendations CIR;Supervision for mobility/OOB    Equipment Recommendations  Rolling walker with 5" wheels    Recommendations for Other Services Rehab consult     Precautions / Restrictions Precautions Precautions: Fall Restrictions Weight Bearing Restrictions: No      Mobility  Bed Mobility Overal bed mobility: Modified Independent             General bed mobility comments: Increased time.  Verbal cues to move RLE off of bed - patient sitting EOB with LLE on floor and RLE partially on bed.  Transfers Overall transfer level: Needs assistance Equipment used: None;Rolling walker (2 wheeled) Transfers: Sit to/from Stand Sit to Stand: Min assist         General transfer comment: Initially moved to standing with no assistive device.  Patient leaning to right side.  Worked on midline positioning in stance.  Returned to sitting with use of RW - balance improved.  Ambulation/Gait Ambulation/Gait assistance: Mod assist Ambulation Distance (Feet): 20 Feet Assistive device: None;Rolling walker (2 wheeled) Gait Pattern/deviations: Step-to pattern;Decreased stance time - right;Decreased step length - left;Decreased stride length;Decreased weight shift to left;Shuffle;Staggering right Gait velocity: Decreased Gait velocity interpretation: Below normal speed for age/gender General Gait Details:  Attempted ambulation with no assistive device.  Patient required mod assist to maintain balance due to strong lean/loss of balance to right.  Provided RW and balance improved - requiring less assistance.  Stairs            Wheelchair Mobility    Modified Rankin (Stroke Patients Only) Modified Rankin (Stroke Patients Only) Pre-Morbid Rankin Score: No symptoms Modified Rankin: Moderately severe disability     Balance Overall balance assessment: Needs assistance Sitting-balance support: No upper extremity supported;Feet supported Sitting balance-Leahy Scale: Good     Standing balance support: No upper extremity supported Standing balance-Leahy Scale: Poor Standing balance comment: Patient with lean/loss of balance to right side.                             Pertinent Vitals/Pain Pain Assessment: No/denies pain    Home Living Family/patient expects to be discharged to:: Private residence Living Arrangements: Alone Available Help at Discharge: Family;Available PRN/intermittently (daughter and son work) Type of Home: House Home Access: Stairs to enter Entrance Stairs-Rails: None Technical brewer of Steps: 2 Home Layout: One level Home Equipment: None      Prior Function Level of Independence: Independent               Hand Dominance        Extremity/Trunk Assessment   Upper Extremity Assessment: Overall WFL for tasks assessed (Strength symmetrical)           Lower Extremity Assessment: RLE deficits/detail RLE Deficits / Details: Decreased strength compared to LLE.  Strength grossly 4/5.  Decreased coordination    Cervical / Trunk Assessment:  Normal  Communication   Communication: No difficulties  Cognition Arousal/Alertness: Awake/alert Behavior During Therapy: WFL for tasks assessed/performed Overall Cognitive Status: Within Functional Limits for tasks assessed                      General Comments      Exercises         Assessment/Plan    PT Assessment Patient needs continued PT services  PT Diagnosis Difficulty walking;Generalized weakness (RLE weakness)   PT Problem List Decreased strength;Decreased activity tolerance;Decreased balance;Decreased mobility;Decreased knowledge of use of DME  PT Treatment Interventions DME instruction;Gait training;Stair training;Functional mobility training;Therapeutic activities;Balance training;Neuromuscular re-education;Patient/family education   PT Goals (Current goals can be found in the Care Plan section) Acute Rehab PT Goals Patient Stated Goal: To return home PT Goal Formulation: With patient Time For Goal Achievement: 07/29/15 Potential to Achieve Goals: Good    Frequency Min 4X/week   Barriers to discharge Decreased caregiver support Family can provide prn assist    Co-evaluation               End of Session Equipment Utilized During Treatment: Gait belt Activity Tolerance: Patient limited by fatigue Patient left: in bed;with call bell/phone within reach;with bed alarm set Nurse Communication: Mobility status    Functional Assessment Tool Used: Clinical judgement Functional Limitation: Mobility: Walking and moving around Mobility: Walking and Moving Around Current Status [id number]): At least 20 percent but less than 40 percent impaired, limited or restricted Mobility: Walking and Moving Around Goal Status [phone]): At least 1 percent but less than 20 percent impaired, limited or restricted    Time: [id number] PT Time Calculation (min) (ACUTE ONLY): 17 min   Charges:   PT Evaluation $PT Eval Moderate Complexity: 1 Procedure     PT G Codes:   PT G-Codes **NOT FOR INPATIENT CLASS** Functional Assessment Tool Used: Clinical judgement Functional Limitation: Mobility: Walking and moving around Mobility: Walking and Moving Around Current Status [id number]): At least 20 percent but less than 40 percent impaired, limited or restricted Mobility:  Walking and Moving Around Goal Status [id number]): At least 1 percent but less than 20 percent impaired, limited or restricted    Despina Pole 07/22/2015, 8:10 PM Carita Pian. Sanjuana Kava, [place] Pager [phone]

## 2015-07-22 NOTE — ED Notes (Signed)
MD at bedside. 

## 2015-07-23 ENCOUNTER — Observation Stay (HOSPITAL_BASED_OUTPATIENT_CLINIC_OR_DEPARTMENT_OTHER): Payer: Medicare Other

## 2015-07-23 ENCOUNTER — Observation Stay (HOSPITAL_COMMUNITY): Payer: Medicare Other

## 2015-07-23 DIAGNOSIS — R531 Weakness: Secondary | ICD-10-CM

## 2015-07-23 DIAGNOSIS — R0609 Other forms of dyspnea: Secondary | ICD-10-CM | POA: Diagnosis not present

## 2015-07-23 DIAGNOSIS — I10 Essential (primary) hypertension: Secondary | ICD-10-CM | POA: Diagnosis not present

## 2015-07-23 DIAGNOSIS — R0602 Shortness of breath: Secondary | ICD-10-CM | POA: Diagnosis not present

## 2015-07-23 DIAGNOSIS — E785 Hyperlipidemia, unspecified: Secondary | ICD-10-CM

## 2015-07-23 DIAGNOSIS — R42 Dizziness and giddiness: Secondary | ICD-10-CM | POA: Diagnosis not present

## 2015-07-23 DIAGNOSIS — R0600 Dyspnea, unspecified: Secondary | ICD-10-CM

## 2015-07-23 LAB — LACTIC ACID, PLASMA
Lactic Acid, Venous: 0.9 mmol/L (ref 0.5–2.0)
Lactic Acid, Venous: 0.9 mmol/L (ref 0.5–2.0)
Lactic Acid, Venous: 1.1 mmol/L (ref 0.5–2.0)
Lactic Acid, Venous: 1.2 mmol/L (ref 0.5–2.0)

## 2015-07-23 LAB — COMPREHENSIVE METABOLIC PANEL
ALBUMIN: 3.5 g/dL (ref 3.5–5.0)
ALT: 12 U/L — AB (ref 17–63)
AST: 15 U/L (ref 15–41)
Alkaline Phosphatase: 61 U/L (ref 38–126)
Anion gap: 13 (ref 5–15)
BILIRUBIN TOTAL: 0.8 mg/dL (ref 0.3–1.2)
BUN: 9 mg/dL (ref 6–20)
CO2: 26 mmol/L (ref 22–32)
Calcium: 8.9 mg/dL (ref 8.9–10.3)
Chloride: 103 mmol/L (ref 101–111)
Creatinine, Ser: 1.26 mg/dL — ABNORMAL HIGH (ref 0.61–1.24)
GFR calc Af Amer: >60 mL/min (ref >60)
GFR calc non Af Amer: 54 mL/min — ABNORMAL LOW (ref >60)
GLUCOSE: 151 mg/dL — AB (ref 65–99)
POTASSIUM: 3 mmol/L — AB (ref 3.5–5.1)
Sodium: 142 mmol/L (ref 135–145)
TOTAL PROTEIN: 6.6 g/dL (ref 6.5–8.1)

## 2015-07-23 LAB — CBC
HEMATOCRIT: 30.9 % — AB (ref 39.0–52.0)
Hemoglobin: 9.2 g/dL — ABNORMAL LOW (ref 13.0–17.0)
MCH: 20.5 pg — ABNORMAL LOW (ref 26.0–34.0)
MCHC: 29.8 g/dL — AB (ref 30.0–36.0)
MCV: 69 fL — AB (ref 78.0–100.0)
Platelets: 202 10*3/uL (ref 150–400)
RBC: 4.48 MIL/uL (ref 4.22–5.81)
RDW: 16.8 % — AB (ref 11.5–15.5)
WBC: 12.4 10*3/uL — ABNORMAL HIGH (ref 4.0–10.5)

## 2015-07-23 LAB — GLUCOSE, CAPILLARY
GLUCOSE-CAPILLARY: 142 mg/dL — AB (ref 65–99)
GLUCOSE-CAPILLARY: 158 mg/dL — AB (ref 65–99)
GLUCOSE-CAPILLARY: 127 mg/dL — AB (ref 65–99)

## 2015-07-23 LAB — APTT: aPTT: 40 seconds — ABNORMAL HIGH (ref 24–37)

## 2015-07-23 LAB — LIPID PANEL
CHOL/HDL RATIO: 3.3 ratio
CHOLESTEROL: 103 mg/dL (ref 0–200)
HDL: 31 mg/dL — ABNORMAL LOW (ref >40)
LDL Cholesterol: 61 mg/dL (ref 0–99)
TRIGLYCERIDES: 57 mg/dL (ref <150)
VLDL: 11 mg/dL (ref 0–40)

## 2015-07-23 LAB — HEMOGLOBIN A1C
Hgb A1c MFr Bld: 6.9 % — ABNORMAL HIGH (ref 4.8–5.6)
MEAN PLASMA GLUCOSE: 151 mg/dL

## 2015-07-23 LAB — ECHOCARDIOGRAM COMPLETE
Height: 69 in
Weight: 3037.06 oz

## 2015-07-23 LAB — PROCALCITONIN: Procalcitonin: 1.7 ng/mL

## 2015-07-23 LAB — PROTIME-INR
INR: 1.35 (ref 0.00–1.49)
PROTHROMBIN TIME: 16.8 s — AB (ref 11.6–15.2)

## 2015-07-23 MED ORDER — SODIUM CHLORIDE 0.9 % IV BOLUS (SEPSIS)
250.0000 mL | Freq: Once | INTRAVENOUS | Status: AC
  Administered 2015-07-23: 250 mL via INTRAVENOUS

## 2015-07-23 MED ORDER — POTASSIUM CHLORIDE 10 MEQ/100ML IV SOLN
10.0000 meq | INTRAVENOUS | Status: AC
  Administered 2015-07-23 (×4): 10 meq via INTRAVENOUS
  Filled 2015-07-23 (×3): qty 100

## 2015-07-23 NOTE — Progress Notes (Signed)
Inpatient Rehabilitation  Received request for prescreen for possible CIR.  Pt. currently with generalized weakness and positive UTI.  CT and MRI of head negative for stroke or hemorrhage.  Pt. is admitted under observation status at this time.  Given his current diagnoses, his [facility] Medicare is unlikely to authorize an inpatient CIR admission.  We are not recommending IP Rehab consult at this time.    [place] Admissions Coordinator Cell [phone] Office [phone]

## 2015-07-23 NOTE — Progress Notes (Signed)
PROGRESS NOTE    Harold Pittman  [id number] DOB: 1940-11-03 DOA: 07/22/2015 PCP: Walker Kehr, MD  Outpatient Specialists:     Brief Narrative:  75 y.o. male with a history of CAD status post CABG, diabetes type 2, osteoarthritis,brought to the emergency department, due to increased generalized weakness with shortness of breath on exertion and mild dizziness upon waking up. In the ED, patient was found to have a UA suggestive of UTI with findings worrisome for sepsis. Patient was admitted for further work up.   Assessment & Plan:   Principal Problem:   Generalized weakness Active Problems:   Dyslipidemia   Essential hypertension   Coronary atherosclerosis of artery bypass graft   Osteoarthritis   DOE (dyspnea on exertion)   DM (diabetes mellitus), type 2 with peripheral vascular complications (HCC)   Weakness   Urinary tract infection   UTI with sepsis present on admission suggested by Nitrites in urine and urinary frequency - F/u urine culture - Ceftriaxone was started on admission, would continue - Patient remains febrile and with tachypnea - CXR clear. Lactate normal  Type II Diabetes Current blood sugar level is 123  Recent Labs    Lab Results  Component Value Date   HGBA1C 6.6* 12/14/2014    Hgb A1C Hold home oral diabetic medications.  SSI Heart healthy carb modified diet.  Leukocytosis, likely related to underlying infection - abx as above - follow cultures and CBC trend  Symptomatic anemia. Hemoglobin 9.9 on admission with MCV of 69.2, likely Iron deficient. No apparent blood loss  No indication for transfusion at this time Iron studies with low iron and low ferritin, suggesting iron deficiency  Hypertension BP 142/66 mmHg  Pulse 92  Temp(Src) 99.5 F (37.5 C) (Oral)  Resp 27  SpO2 94% Continue home anti-hypertensive medications after MRI head returns nornmal Add Hydralazine Q6 hours as needed for SBP >160 and /or DBP >110.           DVT prophylaxis: SCD's Code Status: Full Family Communication: Patient in room, family at bedside Disposition Plan: Uncertain at this time, pt still acutely ill   Consultants:     Procedures:     Antimicrobials: Rocephin 4/14>>>   Subjective: Patient reports to be feeling better today. Family at bedside reports increase in energy and cognition since admission  Objective: Filed Vitals:   07/22/15 2103 07/23/15 0500 07/23/15 0510 07/23/15 1655  BP: 147/68  152/68 141/56  Pulse: 82   96  Temp: 101.6 F (38.7 C)  103.1 F (39.5 C) 101.3 F (38.5 C)  TempSrc: Oral  Oral Oral  Resp:   28 22  Height:      Weight:  86.1 kg (189 lb 13.1 oz)    SpO2: 95%  94%     Intake/Output Summary (Last 24 hours) at 07/23/15 1711 Last data filed at 07/23/15 1200  Gross per 24 hour  Intake   1555 ml  Output   1000 ml  Net    555 ml   Filed Weights   07/22/15 2000 07/23/15 0500  Weight: 84.55 kg (186 lb 6.4 oz) 86.1 kg (189 lb 13.1 oz)    Examination:  General exam: Appears calm and comfortable, laying in bed Respiratory system: Clear to auscultation. Respiratory effort normal. Cardiovascular system: Tachycardic. Marland Kitchen No pedal edema. Gastrointestinal system: Abdomen is nondistended, soft and nontender. No organomegaly or masses felt. Normal bowel sounds heard. Central nervous system: Alert and oriented. No focal neurological deficits. Extremities: Symmetric 5 x  5 power. Skin: No rashes, lesions or ulcers Psychiatry: Judgement and insight appear normal. Mood & affect appropriate.     Data Reviewed: I have personally reviewed following labs and imaging studies  CBC:  Recent Labs Lab 07/22/15 1317 07/23/15 0411  WBC 14.1* 12.4*  NEUTROABS 10.9*  --   HGB 9.9* 9.2*  HCT 31.9* 30.9*  MCV 69.2* 69.0*  PLT 251 [id number]   Basic Metabolic Panel:  Recent Labs Lab 07/22/15 1317 07/23/15 0411  NA 140 142  K 3.5 3.0*  CL 102 103  CO2 26 26  GLUCOSE 119* 151*   BUN 9 9  CREATININE 1.20 1.26*  CALCIUM 9.4 8.9   GFR: Estimated Creatinine Clearance: 55.9 mL/min (by C-G formula based on Cr of 1.26). Liver Function Tests:  Recent Labs Lab 07/22/15 1317 07/23/15 0411  AST 20 15  ALT 14* 12*  ALKPHOS 68 61  BILITOT 0.7 0.8  PROT 7.3 6.6  ALBUMIN 4.0 3.5   No results for input(s): LIPASE, AMYLASE in the last 168 hours. No results for input(s): AMMONIA in the last 168 hours. Coagulation Profile:  Recent Labs Lab 07/22/15 1317 07/23/15 0807  INR 1.20 1.35   Cardiac Enzymes: No results for input(s): CKTOTAL, CKMB, CKMBINDEX, TROPONINI in the last 168 hours. BNP (last 3 results) No results for input(s): PROBNP in the last 8760 hours. HbA1C:  Recent Labs  07/22/15 1818  HGBA1C 6.9*   CBG:  Recent Labs Lab 07/22/15 1220 07/22/15 1816 07/22/15 2126 07/23/15 0755 07/23/15 1150  GLUCAP 123* 177* 107* 142* 158*   Lipid Profile:  Recent Labs  07/23/15 0405  CHOL 103  HDL 31*  LDLCALC 61  TRIG 57  CHOLHDL 3.3   Thyroid Function Tests: No results for input(s): TSH, T4TOTAL, FREET4, T3FREE, THYROIDAB in the last 72 hours. Anemia Panel:  Recent Labs  07/22/15 1818  VITAMINB12 230  FOLATE 6.8  FERRITIN 10*  TIBC 445  IRON 24*  RETICCTPCT 1.4   Urine analysis:    Component Value Date/Time   COLORURINE YELLOW 07/22/2015 1427   APPEARANCEUR CLEAR 07/22/2015 1427   LABSPEC 1.010 07/22/2015 1427   PHURINE 6.0 07/22/2015 1427   GLUCOSEU NEGATIVE 07/22/2015 1427   GLUCOSEU NEGATIVE 03/22/2010 0841   HGBUR NEGATIVE 07/22/2015 1427   BILIRUBINUR NEGATIVE 07/22/2015 1427   KETONESUR NEGATIVE 07/22/2015 1427   PROTEINUR 100* 07/22/2015 1427   UROBILINOGEN 0.2 03/22/2010 0841   NITRITE POSITIVE* 07/22/2015 1427   LEUKOCYTESUR TRACE* 07/22/2015 1427   Sepsis Labs: @LABRCNTIP (procalcitonin:4,lacticidven:4)  )No results found for this or any previous visit (from the past 240 hour(s)).       Radiology  Studies: Ct Head Wo Contrast  07/22/2015  CLINICAL DATA:  Weakness and dizziness for 1 day, unsteady gait, diabetes mellitus, hypertension, coronary artery disease post CABG, former smoker EXAM: CT HEAD WITHOUT CONTRAST TECHNIQUE: Contiguous axial images were obtained from the base of the skull through the vertex without intravenous contrast. COMPARISON:  None FINDINGS: Generalized atrophy. Normal ventricular morphology. No midline shift or mass effect. Otherwise normal appearance of brain parenchyma. No intracranial hemorrhage, mass lesion or evidence acute infarction. No extra-axial fluid collections. Visualized paranasal sinuses and mastoid air cells clear. Osseous structures unremarkable. IMPRESSION: Generalized atrophy. No acute intracranial abnormalities. Electronically Signed   By: Lavonia Dana M.D.   On: 07/22/2015 14:13   Mr Brain Wo Contrast  07/23/2015  CLINICAL DATA:  Increased generalized weakness. Shortness of breath on exertion. Dizziness. EXAM: MRI HEAD WITHOUT CONTRAST TECHNIQUE: Multiplanar,  multiecho pulse sequences of the brain and surrounding structures were obtained without intravenous contrast. COMPARISON:  CT head 07/22/2015. FINDINGS: No acute stroke or hemorrhage. Global atrophy with hydrocephalus ex vacuo. No mass lesion, or extra-axial fluid. Minor white matter disease, nonspecific. Flow voids are maintained. No midline abnormality. No extracranial soft tissue abnormality of significance. Visualized upper cervical region unremarkable. IMPRESSION: Atrophy. Minor white matter disease. No acute intracranial findings. Specifically, no cause seen for lower extremity weakness. Electronically Signed   By: Staci Righter M.D.   On: 07/23/2015 10:31   Dg Chest Port 1 View  07/23/2015  CLINICAL DATA:  75 year old male with history of shortness of breath. Prior history of open heart surgery. EXAM: PORTABLE CHEST 1 VIEW COMPARISON:  Chest x-ray 07/22/2015. FINDINGS: Lung volumes are normal. No  consolidative airspace disease. No pleural effusions. No pneumothorax. No pulmonary nodule or mass noted. Pulmonary vasculature and the cardiomediastinal silhouette are within normal limits. Atherosclerosis in the thoracic aorta. Status post median sternotomy for CABG. IMPRESSION: 1. No radiographic evidence of acute cardiopulmonary disease. 2. Atherosclerosis. Electronically Signed   By: Vinnie Langton M.D.   On: 07/23/2015 14:44   Dg Chest Port 1 View  07/22/2015  CLINICAL DATA:  Weakness and dizziness EXAM: PORTABLE CHEST 1 VIEW COMPARISON:  09/26/2010 FINDINGS: Status post CABG. Normal heart size and mediastinal contours. There is no edema, consolidation, effusion, or pneumothorax. IMPRESSION: Stable.  No active disease. Electronically Signed   By: Monte Fantasia M.D.   On: 07/22/2015 13:26        Scheduled Meds: . amLODipine  10 mg Oral Daily  . aspirin  81 mg Oral Daily  . benazepril  40 mg Oral Daily  . carvedilol  25 mg Oral BID WC  . cefTRIAXone (ROCEPHIN)  IV  1 g Intravenous Q24H  . furosemide  20 mg Oral Daily  . hydrALAZINE  50 mg Oral TID  . meloxicam  15 mg Oral Daily  . pantoprazole  40 mg Oral Daily  . pravastatin  80 mg Oral QHS  . sodium chloride flush  3 mL Intravenous Q12H   Continuous Infusions:      Murline Weigel, Orpah Melter, MD [facility] Pager [phone]  If 7PM-7AM, please contact night-coverage [email or web address] Password [email or web address] 07/23/2015, 5:11 PM

## 2015-07-23 NOTE — Progress Notes (Signed)
Granddaughter called nurse to the room because patient had vomited and needed to be cleaned up. Patient stated he need to go to the bathroom. Patient was too weak to get out of bed. He had already had incontinent episode of stool. He felt very warm and his temp was 103.1 orally. Harold Pittman with [facility] was notified of the findings and that patient was progressively weaker. He is arousable and able to answer questions but appears lethargic. Granddaughter is at the bedside. New orders received. Will continue to monitor closely and notify [facility] as needed.

## 2015-07-23 NOTE — Evaluation (Signed)
Speech Language Pathology Evaluation Patient Details Name: ROSELL FASOLINO MRN: [id number] DOB: 25-Jul-1940 Today's Date: 07/23/2015 Time: [id number] SLP Time Calculation (min) (ACUTE ONLY): 16 min  Problem List:  Patient Active Problem List   Diagnosis Date Noted  . Weakness 07/22/2015  . Generalized weakness 07/22/2015  . Urinary tract infection 07/22/2015  . Gait disorder 03/18/2014  . Acute low back pain 01/12/2014  . Personal history of colonic polyps 12/17/2013  . DM (diabetes mellitus), type 2 with peripheral vascular complications ([place]) [id number]  . Gastroenteritis 06/16/2013  . Acute upper respiratory infection 03/25/2013  . Diarrhea 10/08/2012  . DOE (dyspnea on exertion) 06/29/2011  . Abscess of axilla, right 12/24/2010  . LATERAL EPICONDYLITIS, LEFT 02/20/2010  . HEMATOCHEZIA 07/02/2009  . PROCTITIS 02/23/2009  . TOBACCO USE, QUIT 02/23/2009  . Cough 02/16/2009  . DECREASED HEARING 10/15/2008  . ERECTILE DYSFUNCTION 04/30/2008  . 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 HOARSENESS 08/25/2007  . 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 ANXIETY 05/20/2007  . INSOMNIA, PERSISTENT 03/04/2007  . Dyslipidemia 02/01/2007  . Gout 02/01/2007  . Essential hypertension 02/01/2007  . GERD 02/01/2007  . Osteoarthritis 02/01/2007   Past Medical History:  Past Medical History  Diagnosis Date  . Chronic airway obstruction, not elsewhere classified   . Personal history of tobacco use, presenting hazards to health   . Unspecified essential hypertension   . Coronary atherosclerosis of artery bypass graft   . Other and unspecified hyperlipidemia   . Type II or unspecified type diabetes mellitus without mention of complication, not stated as uncontrolled   . Esophageal reflux   . Blood in stool   . Other specified disorder of rectum and anus   . ED (erectile dysfunction)   . Osteoarthrosis, unspecified whether generalized or localized, unspecified site   . Gout,  unspecified   . Anxiety state, unspecified    Past Surgical History:  Past Surgical History  Procedure Laterality Date  . Coronary artery bypass graft  97    LIMA to LAD, sequential saphenous vein graft to the first and second diagonal, sequential saphenous vein graft to the intermediate OM and circumflex and SVG to RCA  . Rotator cuff repair    . Knee surgery      BILATERAL   HPI:  Pt is a 75 y.o. male with a history of CAD status post CABG, diabetes type 2, osteoarthritis,brought to the emergency department, due to increased generalized weakness with shortness of breath on exertion and mild dizziness. CT of head and MRI of brain negative for acute intracranial findings with minor white matetr disease and atrophy. UA positive for UTI   Assessment / Plan / Recommendation Clinical Impression  Pt presenting with mild to moderate cognitive deficits most likely exacerbated by acute onset of UTI. Current deficits characterized by decreased novel recall, mild temporal disorientation, and decreased executive functioning skills.  Neurological imaging negative for acute intracranial abnormalities. Oral motor exam unremarkable with intact speech and language abilities. Suspect pt to have mild cognitive deficits at baseline and that pt will return to baseline skills once UTI is resolved. No ST needs identified at this time.     SLP Assessment  Patient does not need any further Speech Lanaguage Pathology Services    Follow Up Recommendations       Frequency and Duration           SLP Evaluation Prior Functioning  Cognitive/Linguistic Baseline: Within functional limits Type of Home: House  Lives With: Alone  Available Help at Discharge: Family;Available PRN/intermittently Education: 11th grade Vocation: Retired   Associate Professor  Overall Cognitive Status:  (Family reports pt at baseline) Arousal/Alertness: Lethargic Orientation Level: Disoriented to time;Oriented to person;Oriented to  place;Oriented to situation Attention: Focused Focused Attention: Appears intact Memory: Impaired Memory Impairment: Decreased recall of new information Awareness: Appears intact Problem Solving: Appears intact Safety/Judgment: Appears intact    Comprehension  Auditory Comprehension Overall Auditory Comprehension: Appears within functional limits for tasks assessed Visual Recognition/Discrimination Discrimination: Within Function Limits Reading Comprehension Reading Status: Within funtional limits    Expression Expression Primary Mode of Expression: Verbal Verbal Expression Overall Verbal Expression: Appears within functional limits for tasks assessed Written Expression Dominant Hand: Right   Oral / Motor  Oral Motor/Sensory Function Overall Oral Motor/Sensory Function: Within functional limits Motor Speech Overall Motor Speech: Appears within functional limits for tasks assessed   GO                   Arvil Chaco MA, [place] Pathologist    Levi Aland 07/23/2015, 1:53 PM

## 2015-07-23 NOTE — Progress Notes (Signed)
  Echocardiogram 2D Echocardiogram has been performed.  Diamond Nickel 07/23/2015, 4:29 PM

## 2015-07-24 DIAGNOSIS — R531 Weakness: Secondary | ICD-10-CM | POA: Diagnosis not present

## 2015-07-24 DIAGNOSIS — I10 Essential (primary) hypertension: Secondary | ICD-10-CM | POA: Diagnosis not present

## 2015-07-24 DIAGNOSIS — E785 Hyperlipidemia, unspecified: Secondary | ICD-10-CM | POA: Diagnosis not present

## 2015-07-24 DIAGNOSIS — R0609 Other forms of dyspnea: Secondary | ICD-10-CM | POA: Diagnosis not present

## 2015-07-24 LAB — GLUCOSE, CAPILLARY
GLUCOSE-CAPILLARY: 169 mg/dL — AB (ref 65–99)
GLUCOSE-CAPILLARY: 124 mg/dL — AB (ref 65–99)
Glucose-Capillary: 144 mg/dL — ABNORMAL HIGH (ref 65–99)
Glucose-Capillary: 228 mg/dL — ABNORMAL HIGH (ref 65–99)

## 2015-07-24 LAB — CBC
HEMATOCRIT: 28.6 % — AB (ref 39.0–52.0)
HEMOGLOBIN: 8.6 g/dL — AB (ref 13.0–17.0)
MCH: 20.8 pg — AB (ref 26.0–34.0)
MCHC: 30.1 g/dL (ref 30.0–36.0)
MCV: 69.1 fL — AB (ref 78.0–100.0)
PLATELETS: 166 10*3/uL (ref 150–400)
RBC: 4.14 MIL/uL — AB (ref 4.22–5.81)
RDW: 17 % — ABNORMAL HIGH (ref 11.5–15.5)
WBC: 17.6 10*3/uL — AB (ref 4.0–10.5)

## 2015-07-24 LAB — BASIC METABOLIC PANEL
ANION GAP: 12 (ref 5–15)
BUN: 13 mg/dL (ref 6–20)
CHLORIDE: 102 mmol/L (ref 101–111)
CO2: 25 mmol/L (ref 22–32)
Calcium: 8.6 mg/dL — ABNORMAL LOW (ref 8.9–10.3)
Creatinine, Ser: 1.44 mg/dL — ABNORMAL HIGH (ref 0.61–1.24)
GFR calc non Af Amer: 46 mL/min — ABNORMAL LOW (ref >60)
GFR, EST AFRICAN AMERICAN: 54 mL/min — AB (ref >60)
Glucose, Bld: 138 mg/dL — ABNORMAL HIGH (ref 65–99)
POTASSIUM: 3.2 mmol/L — AB (ref 3.5–5.1)
SODIUM: 139 mmol/L (ref 135–145)

## 2015-07-24 LAB — URINE CULTURE: Culture: 1000 — AB

## 2015-07-24 NOTE — Evaluation (Addendum)
Occupational Therapy Evaluation Patient Details Name: Harold Pittman MRN: [id number] DOB: [birth year] Today's Date: 07/24/2015    History of Present Illness 75 y.o. male with a history of CAD status post CABG, diabetes type 2, osteoarthritis, HTN, gout, Right rotator cuff repair, brought to the emergency department, due to increased generalized weakness with shortness of breath on exertion and mild dizziness upon waking up. In the ED, patient was found to have a UA suggestive of UTI with findings worrisome for sepsis. MRI negative for stroke.    Clinical Impression   Pt admitted with above. Pt independent with ADLs, PTA. Feel pt will benefit from acute OT to increase independence prior to d/c.     Follow Up Recommendations  Home health OT;Supervision/Assistance - 24 hour    Equipment Recommendations  3 in 1 bedside comode    Recommendations for Other Services       Precautions / Restrictions Precautions Precautions: Fall Restrictions Weight Bearing Restrictions: No      Mobility Bed Mobility               General bed mobility comments: not assessed  Transfers Overall transfer level: Needs assistance Equipment used: None Transfers: Sit to/from Stand Sit to Stand: Min guard              Balance      LOB with ambulation-Mod assist. Pt also ambulated with RW with Min guard assist given.   Simulated functional task with single leg stance standing and pt needing UE supported.                                    ADL Overall ADL's : Needs assistance/impaired Eating/Feeding: Independent;Sitting   Grooming: Oral care;Standing;Set up;Supervision/safety       Lower Body Bathing: Min guard (standing with UE supported)       Lower Body Dressing: Min guard;Sit to/from stand   Toilet Transfer: Moderate assistance;Ambulation (ambulated with RW-Min guard; Mod assist walking without RW) Toilet Transfer Details (indicate cue type and reason): sit to  stand from chair         Functional mobility during ADLs: Moderate assistance (Mod assist for ambulation without RW; Min guard with RW) General ADL Comments: Discussed getting 3 in 1 to use as shower chair.     Vision     Perception     Praxis      Pertinent Vitals/Pain Pain Assessment: No/denies pain     Hand Dominance     Extremity/Trunk Assessment Upper Extremity Assessment Upper Extremity Assessment: Generalized weakness (in bilateral shoulder flexors)   Lower Extremity Assessment Lower Extremity Assessment: Defer to PT evaluation       Communication Communication Communication: No difficulties   Cognition Arousal/Alertness: Awake/alert Behavior During Therapy: WFL for tasks assessed/performed Overall Cognitive Status:  (unsure of baseline; decreased safety awareness)                     General Comments       Exercises       Shoulder Instructions      Home Living Family/patient expects to be discharged to:: Private residence Living Arrangements: Alone Available Help at Discharge: Family;Available PRN/intermittently Type of Home: House Home Access: Stairs to enter [employer] of Steps: 2 Entrance Stairs-Rails: None Home Layout: One level     Bathroom Shower/Tub: Tub/shower unit;Walk-in shower   Bathroom Toilet: Standard     Home  Equipment: None      Lives With: Alone    Prior Functioning/Environment Level of Independence: Independent             OT Diagnosis: Generalized weakness   OT Problem List: Decreased strength;Decreased safety awareness;Impaired balance (sitting and/or standing);Decreased knowledge of precautions;Decreased knowledge of use of DME or AE   OT Treatment/Interventions: Self-care/ADL training;Therapeutic exercise;DME and/or AE instruction;Therapeutic activities;Patient/family education;Balance training;Cognitive remediation/compensation    OT Goals(Current goals can be found in the care plan  section) Acute Rehab OT Goals Patient Stated Goal: go home OT Goal Formulation: With patient Time For Goal Achievement: 07/31/15 Potential to Achieve Goals: Good ADL Goals Pt Will Perform Lower Body Dressing: sit to/from stand;with modified independence (including going to gather items) Pt Will Transfer to Toilet: with modified independence;ambulating;regular height toilet (with RW) Pt Will Perform Toileting - Clothing Manipulation and hygiene: with modified independence;sit to/from stand Additional ADL Goal #1: Pt will independently perform HEP for bilateral UEs to increase strength.  OT Frequency: Min 2X/week   Barriers to D/C:            Co-evaluation              End of Session Equipment Utilized During Treatment: Gait belt Nurse Communication: Mobility status;Other (comment) (pt reported he thought it was okay to get up alone); recommending HHOT  Activity Tolerance: Patient tolerated treatment well Patient left: in chair;with call bell/phone within reach;with chair alarm set;with family/visitor present   Time: [id number] OT Time Calculation (min): 16 min Charges:  OT General Charges $OT Visit: 1 Procedure OT Evaluation $OT Eval Moderate Complexity: 1 Procedure G-Codes: OT G-codes **NOT FOR INPATIENT CLASS** Functional Assessment Tool Used: clinical judgment Functional Limitation: Self care Self Care Current Status [id number]): At least 1 percent but less than 20 percent impaired, limited or restricted Self Care Goal Status [id number]): 0 percent impaired, limited or restricted  Benito Mccreedy OTR/L [id number] 07/24/2015, 3:51 PM

## 2015-07-24 NOTE — Progress Notes (Signed)
PROGRESS NOTE    Harold Pittman  [id number] DOB: 1940-11-03 DOA: 07/22/2015 PCP: Walker Kehr, MD  Outpatient Specialists:     Brief Narrative:  75 y.o. male with a history of CAD status post CABG, diabetes type 2, osteoarthritis,brought to the emergency department, due to increased generalized weakness with shortness of breath on exertion and mild dizziness upon waking up. In the ED, patient was found to have a UA suggestive of UTI with findings worrisome for sepsis. Patient was admitted for further work up.   Assessment & Plan:   Principal Problem:   Generalized weakness Active Problems:   Dyslipidemia   Essential hypertension   Coronary atherosclerosis of artery bypass graft   Osteoarthritis   DOE (dyspnea on exertion)   DM (diabetes mellitus), type 2 with peripheral vascular complications (HCC)   Weakness   Urinary tract infection   UTI with sepsis present on admission suggested by Nitrites in urine and urinary frequency - urine culture with insignificant growth, although urine cx was obtained AFTER starting abx - Ceftriaxone was started on admission, would continue - Patient has clinically improved markedly overnight. Fevers are improving - CXR clear. Lactate normal - Pt reports feeling much better and asking about going home - WBC up to 17.6k this AM. Will cont to monitor  Type II Diabetes Current blood sugar level is 123  Recent Labs    Lab Results  Component Value Date   HGBA1C 6.6* 12/14/2014    Hgb A1C 6.9 Holding home oral diabetic medications.  Cont SSI Heart healthy carb modified diet.  Leukocytosis, likely related to underlying infection - abx as above -Anticipate gradual improvement of WBC as pt reports feeling much improved today and fevers seem to be improving  Symptomatic anemia. Hemoglobin 9.9 on admission with MCV of 69.2, likely Iron deficient. No apparent blood loss  No indication for transfusion at this time Iron  studies with low iron and low ferritin, suggesting iron deficiency  Hypertension BP 142/66 mmHg  Pulse 92  Temp(Src) 99.5 F (37.5 C) (Oral)  Resp 27  SpO2 94% On home hydralazine and benazepril Coreg on hold for now BP this AM soft, so will cont to monitor        DVT prophylaxis: SCD's Code Status: Full Family Communication: Patient in room, son at bedside Disposition Plan: Plan d/c home when afebrile x 24hrs and off IV meds, possible in 2-3 days   Consultants:     Procedures:     Antimicrobials: Rocephin 4/14>>>   Subjective: Feels much better today and asking about going home  Objective: Filed Vitals:   07/24/15 0851 07/24/15 0930 07/24/15 1310 07/24/15 1550  BP:  130/56 103/48 109/54  Pulse:   70   Temp: 100.5 F (38.1 C) 99.1 F (37.3 C) 98.6 F (37 C)   TempSrc: Oral Oral Oral   Resp:   20   Height:      Weight:      SpO2:   97%     Intake/Output Summary (Last 24 hours) at 07/24/15 1657 Last data filed at 07/24/15 0937  Gross per 24 hour  Intake    703 ml  Output    925 ml  Net   -222 ml   Filed Weights   07/22/15 2000 07/23/15 0500 07/24/15 0420  Weight: 84.55 kg (186 lb 6.4 oz) 86.1 kg (189 lb 13.1 oz) 85.4 kg (188 lb 4.4 oz)    Examination:  General exam: Appears calm and comfortable, sitting in chair  eating lunch Respiratory system: Clear to auscultation. Respiratory effort normal. Cardiovascular system: Regular, s,1, s2. Marland Kitchen No pedal edema. Gastrointestinal system: Abdomen is nondistended, soft and nontender. No organomegaly or masses felt. Normal bowel sounds heard. Central nervous system: Alert and oriented. No focal neurological deficits. Extremities: Symmetric 5 x 5 power. Skin: No rashes, lesions or ulcers Psychiatry: Judgement and insight appear normal. Mood & affect appropriate.     Data Reviewed: I have personally reviewed following labs and imaging studies  CBC:  Recent Labs Lab 07/22/15 1317 07/23/15 0411  07/24/15 0530  WBC 14.1* 12.4* 17.6*  NEUTROABS 10.9*  --   --   HGB 9.9* 9.2* 8.6*  HCT 31.9* 30.9* 28.6*  MCV 69.2* 69.0* 69.1*  PLT 251 202 [id number]   Basic Metabolic Panel:  Recent Labs Lab 07/22/15 1317 07/23/15 0411 07/24/15 0530  NA 140 142 139  K 3.5 3.0* 3.2*  CL 102 103 102  CO2 26 26 25   GLUCOSE 119* 151* 138*  BUN 9 9 13   CREATININE 1.20 1.26* 1.44*  CALCIUM 9.4 8.9 8.6*   GFR: Estimated Creatinine Clearance: 48.8 mL/min (by C-G formula based on Cr of 1.44). Liver Function Tests:  Recent Labs Lab 07/22/15 1317 07/23/15 0411  AST 20 15  ALT 14* 12*  ALKPHOS 68 61  BILITOT 0.7 0.8  PROT 7.3 6.6  ALBUMIN 4.0 3.5   No results for input(s): LIPASE, AMYLASE in the last 168 hours. No results for input(s): AMMONIA in the last 168 hours. Coagulation Profile:  Recent Labs Lab 07/22/15 1317 07/23/15 0807  INR 1.20 1.35   Cardiac Enzymes: No results for input(s): CKTOTAL, CKMB, CKMBINDEX, TROPONINI in the last 168 hours. BNP (last 3 results) No results for input(s): PROBNP in the last 8760 hours. HbA1C:  Recent Labs  07/22/15 1818  HGBA1C 6.9*   CBG:  Recent Labs Lab 07/23/15 0755 07/23/15 1150 07/23/15 2144 07/24/15 0744 07/24/15 1153  GLUCAP 142* 158* 127* 144* 228*   Lipid Profile:  Recent Labs  07/23/15 0405  CHOL 103  HDL 31*  LDLCALC 61  TRIG 57  CHOLHDL 3.3   Thyroid Function Tests: No results for input(s): TSH, T4TOTAL, FREET4, T3FREE, THYROIDAB in the last 72 hours. Anemia Panel:  Recent Labs  07/22/15 1818  VITAMINB12 230  FOLATE 6.8  FERRITIN 10*  TIBC 445  IRON 24*  RETICCTPCT 1.4   Urine analysis:    Component Value Date/Time   COLORURINE YELLOW 07/22/2015 1427   APPEARANCEUR CLEAR 07/22/2015 1427   LABSPEC 1.010 07/22/2015 1427   PHURINE 6.0 07/22/2015 1427   GLUCOSEU NEGATIVE 07/22/2015 1427   GLUCOSEU NEGATIVE 03/22/2010 0841   HGBUR NEGATIVE 07/22/2015 1427   BILIRUBINUR NEGATIVE 07/22/2015 1427    KETONESUR NEGATIVE 07/22/2015 1427   PROTEINUR 100* 07/22/2015 1427   UROBILINOGEN 0.2 03/22/2010 0841   NITRITE POSITIVE* 07/22/2015 1427   LEUKOCYTESUR TRACE* 07/22/2015 1427   Sepsis Labs: @LABRCNTIP (procalcitonin:4,lacticidven:4)  ) Recent Results (from the past 240 hour(s))  Urine culture     Status: Abnormal   Collection Time: 07/23/15  5:10 PM  Result Value Ref Range Status   Specimen Description URINE, RANDOM  Final   Special Requests NONE  Final   Culture 1,000 COLONIES/mL INSIGNIFICANT GROWTH (A)  Final   Report Status 07/24/2015 FINAL  Final         Radiology Studies: Mr Brain Wo Contrast  07/23/2015  CLINICAL DATA:  Increased generalized weakness. Shortness of breath on exertion. Dizziness. EXAM: MRI HEAD WITHOUT  CONTRAST TECHNIQUE: Multiplanar, multiecho pulse sequences of the brain and surrounding structures were obtained without intravenous contrast. COMPARISON:  CT head 07/22/2015. FINDINGS: No acute stroke or hemorrhage. Global atrophy with hydrocephalus ex vacuo. No mass lesion, or extra-axial fluid. Minor white matter disease, nonspecific. Flow voids are maintained. No midline abnormality. No extracranial soft tissue abnormality of significance. Visualized upper cervical region unremarkable. IMPRESSION: Atrophy. Minor white matter disease. No acute intracranial findings. Specifically, no cause seen for lower extremity weakness. Electronically Signed   By: Staci Righter M.D.   On: 07/23/2015 10:31   Dg Chest Port 1 View  07/23/2015  CLINICAL DATA:  75 year old male with history of shortness of breath. Prior history of open heart surgery. EXAM: PORTABLE CHEST 1 VIEW COMPARISON:  Chest x-ray 07/22/2015. FINDINGS: Lung volumes are normal. No consolidative airspace disease. No pleural effusions. No pneumothorax. No pulmonary nodule or mass noted. Pulmonary vasculature and the cardiomediastinal silhouette are within normal limits. Atherosclerosis in the thoracic aorta.  Status post median sternotomy for CABG. IMPRESSION: 1. No radiographic evidence of acute cardiopulmonary disease. 2. Atherosclerosis. Electronically Signed   By: Vinnie Langton M.D.   On: 07/23/2015 14:44        Scheduled Meds: . amLODipine  10 mg Oral Daily  . aspirin  81 mg Oral Daily  . benazepril  40 mg Oral Daily  . carvedilol  25 mg Oral BID WC  . cefTRIAXone (ROCEPHIN)  IV  1 g Intravenous Q24H  . furosemide  20 mg Oral Daily  . hydrALAZINE  50 mg Oral TID  . meloxicam  15 mg Oral Daily  . pantoprazole  40 mg Oral Daily  . pravastatin  80 mg Oral QHS  . sodium chloride flush  3 mL Intravenous Q12H   Continuous Infusions:      CHIU, Orpah Melter, MD [facility] Pager [phone]  If 7PM-7AM, please contact night-coverage [email or web address] Password [facility] 07/24/2015, 4:57 PM

## 2015-07-25 DIAGNOSIS — R0609 Other forms of dyspnea: Secondary | ICD-10-CM | POA: Diagnosis not present

## 2015-07-25 DIAGNOSIS — M545 Low back pain: Secondary | ICD-10-CM | POA: Diagnosis not present

## 2015-07-25 DIAGNOSIS — M1712 Unilateral primary osteoarthritis, left knee: Secondary | ICD-10-CM | POA: Diagnosis not present

## 2015-07-25 DIAGNOSIS — R531 Weakness: Secondary | ICD-10-CM | POA: Diagnosis not present

## 2015-07-25 DIAGNOSIS — R269 Unspecified abnormalities of gait and mobility: Secondary | ICD-10-CM | POA: Diagnosis not present

## 2015-07-25 DIAGNOSIS — E785 Hyperlipidemia, unspecified: Secondary | ICD-10-CM | POA: Diagnosis not present

## 2015-07-25 DIAGNOSIS — M1711 Unilateral primary osteoarthritis, right knee: Secondary | ICD-10-CM | POA: Diagnosis not present

## 2015-07-25 DIAGNOSIS — I10 Essential (primary) hypertension: Secondary | ICD-10-CM | POA: Diagnosis not present

## 2015-07-25 LAB — GLUCOSE, CAPILLARY
GLUCOSE-CAPILLARY: 136 mg/dL — AB (ref 65–99)
GLUCOSE-CAPILLARY: 131 mg/dL — AB (ref 65–99)
Glucose-Capillary: 217 mg/dL — ABNORMAL HIGH (ref 65–99)
Glucose-Capillary: 130 mg/dL — ABNORMAL HIGH (ref 65–99)

## 2015-07-25 LAB — BASIC METABOLIC PANEL
Anion gap: 11 (ref 5–15)
BUN: 16 mg/dL (ref 6–20)
CALCIUM: 8.5 mg/dL — AB (ref 8.9–10.3)
CO2: 26 mmol/L (ref 22–32)
Chloride: 104 mmol/L (ref 101–111)
Creatinine, Ser: 1.41 mg/dL — ABNORMAL HIGH (ref 0.61–1.24)
GFR calc Af Amer: 55 mL/min — ABNORMAL LOW (ref >60)
GFR, EST NON AFRICAN AMERICAN: 48 mL/min — AB (ref >60)
GLUCOSE: 147 mg/dL — AB (ref 65–99)
Potassium: 3.6 mmol/L (ref 3.5–5.1)
Sodium: 141 mmol/L (ref 135–145)

## 2015-07-25 LAB — CBC
HCT: 27.4 % — ABNORMAL LOW (ref 39.0–52.0)
Hemoglobin: 8.2 g/dL — ABNORMAL LOW (ref 13.0–17.0)
MCH: 20.6 pg — AB (ref 26.0–34.0)
MCHC: 29.9 g/dL — AB (ref 30.0–36.0)
MCV: 68.8 fL — ABNORMAL LOW (ref 78.0–100.0)
Platelets: 204 10*3/uL (ref 150–400)
RBC: 3.98 MIL/uL — ABNORMAL LOW (ref 4.22–5.81)
RDW: 16.8 % — AB (ref 11.5–15.5)
WBC: 10.7 10*3/uL — ABNORMAL HIGH (ref 4.0–10.5)

## 2015-07-25 NOTE — Progress Notes (Signed)
Occupational Therapy Treatment Patient Details Name: Harold Pittman MRN: [id number] DOB: 06-30-1940 Today's Date: 07/25/2015    History of present illness 75 y.o. male with a history of CAD status post CABG, diabetes type 2, osteoarthritis, HTN, gout, Right rotator cuff repair, brought to the emergency department, due to increased generalized weakness with shortness of breath on exertion and mild dizziness upon waking up. In the ED, patient was found to have a UA suggestive of UTI with findings worrisome for sepsis. MRI negative for stroke.    OT comments  Pt progressing toward goals. Family  Present during session. Dicussed pt needing to mobilize with RW at all times. Pt/verbalized understanding. Also educated on reducing risk of falls and given written information. Continue to recommend pt follow up with [place].    Follow Up Recommendations  Home health OT;Supervision/Assistance - 24 hour    Equipment Recommendations  3 in 1 bedside comode    Recommendations for Other Services      Precautions / Restrictions Precautions Precautions: Fall       Mobility Bed Mobility               General bed mobility comments: OOB in chair  Transfers Overall transfer level: Needs assistance Equipment used: Rolling walker (2 wheeled) Transfers: Sit to/from Stand Sit to Stand: Supervision              Balance     Sitting balance-Leahy Scale: Good       Standing balance-Leahy Scale: Poor                     ADL Overall ADL's : Needs assistance/impaired     Grooming: Supervision/safety;Standing                   Toilet Transfer: Supervision/safety;RW           Functional mobility during ADLs: Supervision/safety;Rolling walker;Cueing for safety General ADL Comments: pt asking if he needed HHOT/PT. Pt is unsafe to mobilize without a RW at this time. discussed his PLOF and feel that he would benefit from [facility] services to return to his PLOF and reduce risk of  falls. Daughter present. also discussed home modifications and supervising activity to assure safety after D/C. Recommend pt install grab bars in shower.       Vision                     Perception     Praxis      Cognition   Behavior During Therapy: WFL for tasks assessed/performed Overall Cognitive Status: Within Functional Limits for tasks assessed                       Extremity/Trunk Assessment               Exercises     Shoulder Instructions       General Comments      Pertinent Vitals/ Pain       Pain Assessment: No/denies pain  Home Living                                          Prior Functioning/Environment              Frequency Min 2X/week     Progress Toward Goals  OT Goals(current goals can now be found in  the care plan section)  Progress towards OT goals: Progressing toward goals  Acute Rehab OT Goals Patient Stated Goal: go home OT Goal Formulation: With patient Time For Goal Achievement: 07/31/15 Potential to Achieve Goals: Good ADL Goals Pt Will Perform Lower Body Dressing: sit to/from stand;with modified independence Pt Will Transfer to Toilet: with modified independence;ambulating;regular height toilet Pt Will Perform Toileting - Clothing Manipulation and hygiene: with modified independence;sit to/from stand Additional ADL Goal #1: Pt will independently perform HEP for bilateral UEs to increase strength.  Plan Discharge plan remains appropriate    Co-evaluation                 End of Session Equipment Utilized During Treatment: Gait belt;Rolling walker   Activity Tolerance Patient tolerated treatment well   Patient Left in chair;with call bell/phone within reach;with chair alarm set;with family/visitor present   Nurse Communication Mobility status        Time: 1530-1550 OT Time Calculation (min): 20 min  Charges: OT General Charges $OT Visit: 1 Procedure OT  Treatments $Self Care/Home Management : 8-22 mins  Anayah Arvanitis,HILLARY 07/25/2015, 4:02 PM   [facility], OTR/L  [phone] 07/25/2015

## 2015-07-25 NOTE — Progress Notes (Signed)
PROGRESS NOTE    Harold Pittman  [id number] DOB: 05/14/1940 DOA: 07/22/2015 PCP: Walker Kehr, MD  Outpatient Specialists:     Brief Narrative:  75 y.o. male with a history of CAD status post CABG, diabetes type 2, osteoarthritis,brought to the emergency department, due to increased generalized weakness with shortness of breath on exertion and mild dizziness upon waking up. In the ED, patient was found to have a UA suggestive of UTI with findings worrisome for sepsis. Patient was admitted for further work up.   Assessment & Plan:   Principal Problem:   Generalized weakness Active Problems:   Dyslipidemia   Essential hypertension   Coronary atherosclerosis of artery bypass graft   Osteoarthritis   DOE (dyspnea on exertion)   DM (diabetes mellitus), type 2 with peripheral vascular complications (HCC)   Weakness   Urinary tract infection   UTI with sepsis present on admission suggested by Nitrites in urine and urinary frequency - urine culture with insignificant growth, although urine cx was obtained AFTER starting abx - Ceftriaxone was started on admission, would continue - Patient has clinically improved markedly overnight. Tmax of 100F this AM, but afebrile afterwards. Overall fevers improving - CXR clear. Lactate normal - Pt reports feeling much better and asking about going home - WBC improving  Type II Diabetes Current blood sugar level is 123  Recent Labs    Lab Results  Component Value Date   HGBA1C 6.6* 12/14/2014    Hgb A1C 6.9 Holding home oral diabetic medications.  Cont SSI Heart healthy carb modified diet.  Leukocytosis, likely related to underlying infection - abx as above -Anticipate gradual improvement of WBC as pt reports feeling much improved today and fevers seem to be improving  Symptomatic anemia. Hemoglobin 9.9 on admission with MCV of 69.2, likely Iron deficient. No apparent blood loss  No indication for transfusion at  this time Iron studies with low iron and low ferritin, suggesting iron deficiency  Hypertension BP 142/66 mmHg  Pulse 92  Temp(Src) 99.5 F (37.5 C) (Oral)  Resp 27  SpO2 94% On home hydralazine and benazepril oreg on hold for now BP this AM soft, so will cont to monitor        DVT prophylaxis: SCD's Code Status: Full Family Communication: Patient in room, grandson at bedside Disposition Plan: Plan d/c home when afebrile x 24hrs and off IV meds, possible in 24hrs   Consultants:     Procedures:     Antimicrobials: Rocephin 4/14>>>   Subjective: Patient is very eager to go home today  Objective: Filed Vitals:   07/24/15 2238 07/25/15 0517 07/25/15 0741 07/25/15 1308  BP: 119/54 137/66  115/55  Pulse: 83 77  66  Temp: 100 F (37.8 C) 100.6 F (38.1 C) 98.7 F (37.1 C) 97.8 F (36.6 C)  TempSrc: Oral Oral Oral Oral  Resp: 18   20  Height:      Weight:  82.192 kg (181 lb 3.2 oz)    SpO2: 97% 97%  98%    Intake/Output Summary (Last 24 hours) at 07/25/15 1644 Last data filed at 07/25/15 1307  Gross per 24 hour  Intake    840 ml  Output    725 ml  Net    115 ml   Filed Weights   07/23/15 0500 07/24/15 0420 07/25/15 0517  Weight: 86.1 kg (189 lb 13.1 oz) 85.4 kg (188 lb 4.4 oz) 82.192 kg (181 lb 3.2 oz)    Examination:  General exam:  Appears calm and comfortable, sitting in chair Respiratory system: Clear to auscultation. Respiratory effort normal. Cardiovascular system: Regular, s,1, s2. Marland Kitchen No pedal edema. Gastrointestinal system: Abdomen is nondistended, soft and nontender. No organomegaly or masses felt. Normal bowel sounds heard. Central nervous system: Alert and oriented. No focal neurological deficits. Extremities: Symmetric 5 x 5 power. Skin: No rashes, lesions or ulcers Psychiatry: Judgement and insight appear normal. Mood & affect appropriate.     Data Reviewed: I have personally reviewed following labs and imaging  studies  CBC:  Recent Labs Lab 07/22/15 1317 07/23/15 0411 07/24/15 0530 07/25/15 0412  WBC 14.1* 12.4* 17.6* 10.7*  NEUTROABS 10.9*  --   --   --   HGB 9.9* 9.2* 8.6* 8.2*  HCT 31.9* 30.9* 28.6* 27.4*  MCV 69.2* 69.0* 69.1* 68.8*  PLT 251 202 166 0000000   Basic Metabolic Panel:  Recent Labs Lab 07/22/15 1317 07/23/15 0411 07/24/15 0530 07/25/15 0412  NA 140 142 139 141  K 3.5 3.0* 3.2* 3.6  CL 102 103 102 104  CO2 26 26 25 26   GLUCOSE 119* 151* 138* 147*  BUN 9 9 13 16   CREATININE 1.20 1.26* 1.44* 1.41*  CALCIUM 9.4 8.9 8.6* 8.5*   GFR: Estimated Creatinine Clearance: 46 mL/min (by C-G formula based on Cr of 1.41). Liver Function Tests:  Recent Labs Lab 07/22/15 1317 07/23/15 0411  AST 20 15  ALT 14* 12*  ALKPHOS 68 61  BILITOT 0.7 0.8  PROT 7.3 6.6  ALBUMIN 4.0 3.5   No results for input(s): LIPASE, AMYLASE in the last 168 hours. No results for input(s): AMMONIA in the last 168 hours. Coagulation Profile:  Recent Labs Lab 07/22/15 1317 07/23/15 0807  INR 1.20 1.35   Cardiac Enzymes: No results for input(s): CKTOTAL, CKMB, CKMBINDEX, TROPONINI in the last 168 hours. BNP (last 3 results) No results for input(s): PROBNP in the last 8760 hours. HbA1C:  Recent Labs  07/22/15 1818  HGBA1C 6.9*   CBG:  Recent Labs Lab 07/24/15 1706 07/24/15 2240 07/25/15 0737 07/25/15 1157 07/25/15 1615  GLUCAP 169* 124* 136* 217* 130*   Lipid Profile:  Recent Labs  07/23/15 0405  CHOL 103  HDL 31*  LDLCALC 61  TRIG 57  CHOLHDL 3.3   Thyroid Function Tests: No results for input(s): TSH, T4TOTAL, FREET4, T3FREE, THYROIDAB in the last 72 hours. Anemia Panel:  Recent Labs  07/22/15 1818  VITAMINB12 230  FOLATE 6.8  FERRITIN 10*  TIBC 445  IRON 24*  RETICCTPCT 1.4   Urine analysis:    Component Value Date/Time   COLORURINE YELLOW 07/22/2015 1427   APPEARANCEUR CLEAR 07/22/2015 1427   LABSPEC 1.010 07/22/2015 1427   PHURINE 6.0  07/22/2015 1427   GLUCOSEU NEGATIVE 07/22/2015 1427   GLUCOSEU NEGATIVE 03/22/2010 0841   HGBUR NEGATIVE 07/22/2015 1427   BILIRUBINUR NEGATIVE 07/22/2015 1427   KETONESUR NEGATIVE 07/22/2015 1427   PROTEINUR 100* 07/22/2015 1427   UROBILINOGEN 0.2 03/22/2010 0841   NITRITE POSITIVE* 07/22/2015 1427   LEUKOCYTESUR TRACE* 07/22/2015 1427   Sepsis Labs: @LABRCNTIP (procalcitonin:4,lacticidven:4)  ) Recent Results (from the past 240 hour(s))  Culture, blood (single)     Status: None (Preliminary result)   Collection Time: 07/23/15  6:45 AM  Result Value Ref Range Status   Specimen Description BLOOD LEFT ASSIST CONTROL  Final   Special Requests BOTTLES DRAWN AEROBIC AND ANAEROBIC 10CC EA  Final   Culture NO GROWTH 2 DAYS  Final   Report Status PENDING  Incomplete  Urine culture     Status: Abnormal   Collection Time: 07/23/15  5:10 PM  Result Value Ref Range Status   Specimen Description URINE, RANDOM  Final   Special Requests NONE  Final   Culture 1,000 COLONIES/mL INSIGNIFICANT GROWTH (A)  Final   Report Status 07/24/2015 FINAL  Final         Radiology Studies: No results found.      Scheduled Meds: . amLODipine  10 mg Oral Daily  . aspirin  81 mg Oral Daily  . benazepril  40 mg Oral Daily  . carvedilol  25 mg Oral BID WC  . cefTRIAXone (ROCEPHIN)  IV  1 g Intravenous Q24H  . furosemide  20 mg Oral Daily  . hydrALAZINE  50 mg Oral TID  . meloxicam  15 mg Oral Daily  . pantoprazole  40 mg Oral Daily  . pravastatin  80 mg Oral QHS  . sodium chloride flush  3 mL Intravenous Q12H   Continuous Infusions:      CHIU, Orpah Melter, MD [facility] Pager [phone]  If 7PM-7AM, please contact night-coverage [email or web address] Password [facility] 07/25/2015, 4:44 PM

## 2015-07-25 NOTE — Care Management Obs Status (Signed)
[place] NOTIFICATION   Patient Details  Name: Harold Pittman MRN: [id number] Date of Birth: 1940-04-30   Medicare Observation Status Notification Given:  Yes    Bethena Roys, RN 07/25/2015, 11:21 AM

## 2015-07-25 NOTE — Care Management Note (Addendum)
Case Management Note  Patient Details  Name: Harold Pittman MRN: [id number] Date of Birth: 11/27/1940  Subjective/Objective: Pt in for general weakness. Pt is from home alone, however has support of son.                    Action/Plan: Pt is agreeable to [facility]. Agency List provided and pt wants to wait for son to choose agency. Pt did choose [facility] for DME RW and 3n1. Shower Stool out of pocket cost. DME to be delivered to room before d/c. CM will make [facility] referral once agency provided. No further needs from CM at this time.    Expected Discharge Date:                  Expected Discharge Plan:  [place]  In-House Referral:  NA  Discharge planning Services  CM Consult  Post Acute Care Choice:  Home Health, Durable Medical Equipment Choice offered to:  Patient  DME Arranged:  Walker rolling, Shower stool, 3n1 DME Agency:  [place]:  PT, OT [place] Agency:   [place]  Status of Service:  Completed, signed off  Medicare Important Message Given:    Date Medicare IM Given:    Medicare IM give by:    Date Additional Medicare IM Given:    Additional Medicare Important Message give by:     If discussed at [place] of Stay Meetings, dates discussed:    Additional Comments: [name], RN,BSN [phone] Agency Choice is [facility]. Referral for [facility]- made with [facility] and SOC to begin within 24-48 hours post d/c.  Adelfa Koh Ocie Cornfield, RN 07/25/2015, 11:24 AM

## 2015-07-25 NOTE — Progress Notes (Signed)
Inpatient Diabetes Program Recommendations  AACE/ADA: New Consensus Statement on Inpatient Glycemic Control (2015)  Target Ranges:  Prepandial:   less than 140 mg/dL      Peak postprandial:   less than 180 mg/dL (1-2 hours)      Critically ill patients:  140 - 180 mg/dL  Results for Harold Pittman, Harold Pittman (MRN [id number]) as of 07/25/2015 10:10  Ref. Range 07/24/2015 07:44 07/24/2015 11:53 07/24/2015 17:06 07/24/2015 22:40 07/25/2015 07:37  Glucose-Capillary Latest Ref Range: 65-99 mg/dL 144 (H) 228 (H) 169 (H) 124 (H) 136 (H)   Review of Glycemic Control  Diabetes history: DM2 Outpatient Diabetes medications: Metformin 500 mg BID Current orders for Inpatient glycemic control: None  Inpatient Diabetes Program Recommendations: Correction (SSI): Glucose ranged from 124-228 mg/dl on 07/24/15 and fasting glucose is 136 mg/dl this morning. While inpatient, please consider ordering CBGs with Novolog correction scale ACHS.   Thanks, Barnie Alderman, RN, MSN, CDE Diabetes Coordinator Inpatient Diabetes Program [phone] (Team Pager from [place] to [place]) [phone] ([facility] office) [phone] [facility] office) [phone] [facility] office)

## 2015-07-25 NOTE — Progress Notes (Addendum)
Physical Therapy Treatment Patient Details Name: Harold Pittman MRN: [id number] DOB: February 22, 1941 Today's Date: 07/25/2015    History of Present Illness 75 y.o. male with a history of CAD status post CABG, diabetes type 2, osteoarthritis, HTN, gout, Right rotator cuff repair, brought to the emergency department, due to increased generalized weakness with shortness of breath on exertion and mild dizziness upon waking up. In the ED, patient was found to have a UA suggestive of UTI with findings worrisome for sepsis. MRI negative for stroke.     PT Comments    Pt performed with improved mobility and function.  Pt remains to present with balance deficits and will require use of RW at home.  PTA spoke with patient in regards to home set-up pt refused shower chair (due to out of pocket expense).  Pt educated to use 3:1 in walk-in shower to assist with balance.  Will inform supervising PT of need for change in d/c recommendations.  Pt will require, RW, 3:1 BSC, and HHPT for safe d/c.     Follow Up Recommendations  Home health PT;Supervision for mobility/OOB     Equipment Recommendations  Rolling walker with 5" wheels;3in1 (PT)    Recommendations for Other Services       Precautions / Restrictions Precautions Precautions: Fall Restrictions Weight Bearing Restrictions: No    Mobility  Bed Mobility Overal bed mobility: Modified Independent             General bed mobility comments: no use of rail with good technique.   Transfers Overall transfer level: Needs assistance Equipment used: Rolling walker (2 wheeled);None Transfers: Sit to/from Stand Sit to Stand: Supervision         General transfer comment: Cues for hand placement to push from seated surface.  Pt demonstrated good forward weight shifting and eccentric loading.    Ambulation/Gait Ambulation/Gait assistance: Min assist;Supervision (require min assist without AD and S with RW.  ) Ambulation Distance (Feet): 340 Feet  (without device and 240 ft with RW.  ) Assistive device: Rolling walker (2 wheeled);None Gait Pattern/deviations: Step-through pattern;Staggering left;Staggering right;Decreased stride length Gait velocity: Decreased   General Gait Details: Attempted ambulation with no assistive device.  Patient required min assist to maintain balance due to lean/loss of balance to slow response to right.  Provided RW and balance improved - requiring less assistance.   Stairs Stairs: Yes   Stair Management: Two rails;No rails Number of Stairs: 7 (x5 with B rails, x1 (x two trials)) General stair comments: Pt performed 5 stairs with B rails and min guard.  Pt performed x1 stair with no rails and required min assist with heavy LOB forward.  Pt performed additonal trial and able to perform without LOB with cues for weight shifting and pacing.    Wheelchair Mobility    Modified Rankin (Stroke Patients Only)       Balance Overall balance assessment: Needs assistance   Sitting balance-Leahy Scale: Good     Standing balance support: No upper extremity supported Standing balance-Leahy Scale: Poor (fair with RW. )                      Cognition Arousal/Alertness: Awake/alert Behavior During Therapy: WFL for tasks assessed/performed Overall Cognitive Status: Within Functional Limits for tasks assessed                      Exercises      General Comments  Pertinent Vitals/Pain Pain Assessment: No/denies pain    Home Living                      Prior Function            PT Goals (current goals can now be found in the care plan section) Acute Rehab PT Goals Patient Stated Goal: go home Potential to Achieve Goals: Good Progress towards PT goals: Progressing toward goals    Frequency  Min 4X/week    PT Plan Discharge plan needs to be updated    Co-evaluation             End of Session Equipment Utilized During Treatment: Gait belt Activity  Tolerance: Patient tolerated treatment well Patient left: with call bell/phone within reach;in chair;with chair alarm set     Time: [id number] PT Time Calculation (min) (ACUTE ONLY): 23 min  Charges:  $Gait Training: 23-37 mins                    G Codes:      Cristela Blue 08/12/15, 11:33 AM  Governor Rooks, PTA pager [phone]

## 2015-07-25 NOTE — Plan of Care (Signed)
Problem: Physical Regulation: Goal: Ability to maintain clinical measurements within normal limits will improve Outcome: Progressing Patient maintaining stable vital signs with low-grade fever still present at last check. Not exhibiting other signs/symptoms at this time.  Recent vital signs: Filed Vitals:    07/24/15 0930 07/24/15 1310 07/24/15 1550 07/24/15 2238  BP: 130/56 103/48 109/54 119/54  Pulse:   70   83  Temp: 99.1 F (37.3 C) 98.6 F (37 C)   100 F (37.8 C)  TempSrc: Oral Oral   Oral  Resp:   20   18  Height:          Weight:          SpO2:   97%   97%    Briefly discussed plan for overnight monitoring and potential discharge in AM with patient at hour of sleep. Unable to educate further, as he was preparing for bed; requested lights-out and door closed after receiving nightly medications.  Patient had no questions or concerns at that time.  Continuing to monitor.

## 2015-07-26 DIAGNOSIS — E785 Hyperlipidemia, unspecified: Secondary | ICD-10-CM | POA: Diagnosis not present

## 2015-07-26 DIAGNOSIS — R531 Weakness: Secondary | ICD-10-CM | POA: Diagnosis not present

## 2015-07-26 DIAGNOSIS — I10 Essential (primary) hypertension: Secondary | ICD-10-CM | POA: Diagnosis not present

## 2015-07-26 LAB — OCCULT BLOOD X 1 CARD TO LAB, STOOL: FECAL OCCULT BLD: NEGATIVE

## 2015-07-26 LAB — TYPE AND SCREEN
ABO/RH(D): O POS
Antibody Screen: NEGATIVE

## 2015-07-26 LAB — BASIC METABOLIC PANEL
ANION GAP: 12 (ref 5–15)
BUN: 17 mg/dL (ref 6–20)
CALCIUM: 8.5 mg/dL — AB (ref 8.9–10.3)
CHLORIDE: 106 mmol/L (ref 101–111)
CO2: 24 mmol/L (ref 22–32)
Creatinine, Ser: 1.14 mg/dL (ref 0.61–1.24)
GFR calc non Af Amer: >60 mL/min (ref >60)
Glucose, Bld: 134 mg/dL — ABNORMAL HIGH (ref 65–99)
POTASSIUM: 4 mmol/L (ref 3.5–5.1)
Sodium: 142 mmol/L (ref 135–145)

## 2015-07-26 LAB — CBC
HEMATOCRIT: 25.1 % — AB (ref 39.0–52.0)
HEMOGLOBIN: 7.7 g/dL — AB (ref 13.0–17.0)
MCH: 21.3 pg — ABNORMAL LOW (ref 26.0–34.0)
MCHC: 30.7 g/dL (ref 30.0–36.0)
MCV: 69.3 fL — AB (ref 78.0–100.0)
Platelets: ADEQUATE 10*3/uL (ref 150–400)
RBC: 3.62 MIL/uL — AB (ref 4.22–5.81)
RDW: 16.9 % — AB (ref 11.5–15.5)
WBC: 8.7 10*3/uL (ref 4.0–10.5)

## 2015-07-26 LAB — HEMOGLOBIN AND HEMATOCRIT, BLOOD
HEMATOCRIT: 25.8 % — AB (ref 39.0–52.0)
Hemoglobin: 8 g/dL — ABNORMAL LOW (ref 13.0–17.0)

## 2015-07-26 LAB — ABO/RH: ABO/RH(D): O POS

## 2015-07-26 MED ORDER — FERROUS SULFATE 325 (65 FE) MG PO TABS: 325.0000 mg | ORAL_TABLET | Freq: Every day | ORAL | Status: DC

## 2015-07-26 MED ORDER — DOCUSATE SODIUM 100 MG PO CAPS: 100.0000 mg | ORAL_CAPSULE | Freq: Two times a day (BID) | ORAL | Status: DC

## 2015-07-26 MED ORDER — CEFUROXIME AXETIL 250 MG PO TABS: 250.0000 mg | ORAL_TABLET | Freq: Two times a day (BID) | ORAL | Status: DC

## 2015-07-26 NOTE — Progress Notes (Signed)
Pt discharged home with family.  Reviewed discharge instructions and education, all questions answered.  Assessment unchanged from earlier.  

## 2015-07-26 NOTE — Progress Notes (Signed)
Physical Therapy Treatment Patient Details Name: Harold Pittman MRN: [id number] DOB: June 14, 1940 Today's Date: 07/26/2015    History of Present Illness 75 y.o. male with a history of CAD status post CABG, diabetes type 2, osteoarthritis, HTN, gout, Right rotator cuff repair, brought to the emergency department, due to increased generalized weakness with shortness of breath on exertion and mild dizziness upon waking up. In the ED, patient was found to have a UA suggestive of UTI with findings worrisome for sepsis. MRI negative for stroke.     PT Comments    Pt performed increased activity including, gait, high level balance, and standing therapeutic activities.  Balance deficits remain and pt will require HHPT and RW for use at home to improve balance and maintain safety.    Follow Up Recommendations  Home health PT;Supervision for mobility/OOB     Equipment Recommendations  Rolling walker with 5" wheels;3in1 (PT)    Recommendations for Other Services       Precautions / Restrictions Precautions Precautions: Fall Restrictions Weight Bearing Restrictions: No    Mobility  Bed Mobility Overal bed mobility: Modified Independent             General bed mobility comments: up standing in room.    Transfers Overall transfer level: Needs assistance Equipment used: None Transfers: Sit to/from Stand Sit to Stand: Supervision         General transfer comment: Cues for hand placement and close supervision due to balance deficits.    Ambulation/Gait Ambulation/Gait assistance: Min guard Ambulation Distance (Feet): 340 Feet Assistive device: None Gait Pattern/deviations: Step-through pattern;Staggering left;Staggering right;Decreased stride length Gait velocity: Decreased   General Gait Details: Pt remains to require min guard.  LOB noted with head turns but right response intact during gait training.  pt grabs to rails for support.  pt required cues for upper trunk control,  increasing step length and reciprocal armswing.    Stairs Stairs: Yes Stairs assistance: Min guard Stair Management: Two rails;No rails Number of Stairs: 7 General stair comments: Pt performed 5 stairs with B rails and S.  Pt performed x1 stair with no rails and required min guard.  Pt performed additonal trial and more stable with technique and weight shifting to maintain balance.    Wheelchair Mobility    Modified Rankin (Stroke Patients Only)       Balance Overall balance assessment: Needs assistance   Sitting balance-Leahy Scale: Normal       Standing balance-Leahy Scale: Fair Standing balance comment: Pt had LOB to R and L with gait training without device.               High level balance activites: Side stepping;Backward walking;Direction changes;Turns;Sudden stops;Head turns (tandem walking.  ) High Level Balance Comments: Righting intact require min gaurd to min assist during balance activities.      Cognition Arousal/Alertness: Awake/alert Behavior During Therapy: WFL for tasks assessed/performed Overall Cognitive Status: Within Functional Limits for tasks assessed                      Exercises General Exercises - Lower Extremity Hip ABduction/ADduction: AROM;Standing;Both;15 reps Hip Flexion/Marching: AROM;Both;Standing;15 reps Heel Raises: AROM;Both;Standing;15 reps Mini-Sqauts: AROM;Standing;Both;15 reps Other Exercises Other Exercises: chair push ups 2x5 reps.  Fatigues from activity.      General Comments        Pertinent Vitals/Pain Pain Assessment: No/denies pain    Home Living  Prior Function            PT Goals (current goals can now be found in the care plan section) Acute Rehab PT Goals Patient Stated Goal: go home Potential to Achieve Goals: Good Progress towards PT goals: Progressing toward goals    Frequency  Min 4X/week    PT Plan      Co-evaluation             End of  Session Equipment Utilized During Treatment: Gait belt Activity Tolerance: Patient tolerated treatment well Patient left: with call bell/phone within reach;in chair     Time: 0921-0940 PT Time Calculation (min) (ACUTE ONLY): 19 min  Charges:  $Therapeutic Exercise: 8-22 mins                    G Codes:      Cristela Blue Aug 12, 2015, 9:53 AM  Governor Rooks, PTA pager [phone]

## 2015-07-26 NOTE — Progress Notes (Signed)
Report received via Merleen Nicely RN in patient's room using SBAR format, reviewed orders, labs, VS, meds and patient's general condition, assumed care of patient.

## 2015-07-26 NOTE — Plan of Care (Signed)
Problem: Education: Goal: Knowledge of [facility] Education information/materials will improve Outcome: Completed/Met Date Met:  07/26/15 Pt educated throughout entire admission on medications, pain management, [place] values, procedures, lab results   Problem: Safety: Goal: Ability to remain free from injury will improve Outcome: Completed/Met Date Met:  07/26/15 Pt remains free from injury during this admission this admission   Problem: Physical Regulation: Goal: Will remain free from infection Outcome: Completed/Met Date Met:  07/26/15 Pt has UTI and is being treated with IV abx but has no new infection during this admission   Problem: Bowel/Gastric: Goal: Will not experience complications related to bowel motility Outcome: Completed/Met Date Met:  07/26/15 Pt has no difficulties with bowel motility during this admission

## 2015-07-26 NOTE — Progress Notes (Signed)
Speech Addendum     07/23/15 1300  SLP G-Codes **NOT FOR INPATIENT CLASS**  Functional Assessment Tool Used (skilled clinical judgement)  Functional Limitations Spoken language expressive  Spoken Language Expression Current Status [phone]) [place]  Spoken Language Expression Goal Status [id number]) [place]  Spoken Language Expression Discharge Status [id number]) [place]  SLP Evaluations  $ SLP Speech Visit 1 Procedure  SLP Evaluations  $ SLP EVAL LANGUAGE/SOUND PRODUCTION 1 Procedure    Orbie Pyo Anaysia Germer M.Ed [employer] [phone]

## 2015-07-26 NOTE — Progress Notes (Signed)
Instructed patient early in the shift to use the urinal so that we could measure his urine for more accurate intact and outpt but he kept forgetting, he did tell me how many times that he went throughout the night, VSS and no complaints of pain or discomfort for my shift.

## 2015-07-26 NOTE — Discharge Summary (Signed)
Physician Discharge Summary  Harold Pittman [id number] DOB: 09-06-1940 DOA: 07/22/2015  PCP: Walker Kehr, MD  Admit date: 07/22/2015 Discharge date: 07/26/2015  Time spent: 20 minutes  Recommendations for Outpatient Follow-up:  1. Follow up with PCP in 2-3 weeks   Discharge Diagnoses:  Principal Problem:   Generalized weakness Active Problems:   Dyslipidemia   Essential hypertension   Coronary atherosclerosis of artery bypass graft   Osteoarthritis   DOE (dyspnea on exertion)   DM (diabetes mellitus), type 2 with peripheral vascular complications (HCC)   Weakness   Urinary tract infection   Discharge Condition: Improved  Diet recommendation: Heart healthy, diabetic  Filed Weights   07/24/15 0420 07/25/15 0517 07/26/15 0400  Weight: 85.4 kg (188 lb 4.4 oz) 82.192 kg (181 lb 3.2 oz) 86.047 kg (189 lb 11.2 oz)    History of present illness:  Please review dictated H and P from 4/14 for details. Briefly, 75 y.o. male with a history of CAD status post CABG, diabetes type 2, osteoarthritis,brought to the emergency department, due to increased generalized weakness with shortness of breath on exertion and mild dizziness upon waking up. In the ED, patient was found to have a UA suggestive of UTI with findings worrisome for sepsis. Patient was admitted for further work up.  Hospital Course:  UTI with sepsis present on admission suggested by Nitrites in urine and urinary frequency - urine culture with insignificant growth, although urine cx was obtained AFTER starting abx -Ceftriaxone was started on admission - Patient has clinically improved markedly overnight. Tmax of 100F this AM, but afebrile afterwards. Overall fevers improving - CXR clear. Lactate normal - Pt reports feeling much better and asking about going home - WBC normalized - As patient has improved with rocephin, would complete course with ceftin on d/c x 2 more days  Type II Diabetes Current blood sugar level  is 123  Recent Labs    Lab Results  Component Value Date   HGBA1C 6.6* 12/14/2014    Hgb A1C 6.9 Holding home oral diabetic medications.  Cont SSI Heart healthy carb modified diet.  Leukocytosis, likely related to underlying infection - abx as above -resolved  Symptomatic anemia. Hemoglobin 9.9 on admission with MCV of 69.2, likely Iron deficient. No apparent blood loss  Iron studies with low iron and low ferritin, suggesting iron deficiency Pt reports multiple normal colonoscopies prior to admission Stools are heme neg Repeat hgb was improved to 8.0. Patient asymptomatic Recommend continue iron replacement as outpatient, follow up with PCP  Hypertension BP 142/66 mmHg  Pulse 92  Temp(Src) 99.5 F (37.5 C) (Oral)  Resp 27  SpO2 94% On home hydralazine and benazepril  BP remained stable          Discharge Exam: Filed Vitals:   07/25/15 2130 07/25/15 2141 07/26/15 0400 07/26/15 0830  BP: 135/77 142/76 126/59   Pulse: 74  72   Temp: 97.7 F (36.5 C)  98.8 F (37.1 C) 98.9 F (37.2 C)  TempSrc: Oral  Oral Oral  Resp: 20  18   Height:      Weight:   86.047 kg (189 lb 11.2 oz)   SpO2: 96%  96%     General: Awake, in nad Cardiovascular: regular, s1, s2 Respiratory: normal resp effort, no wheezing  Discharge Instructions     Medication List    TAKE these medications        albuterol 108 (90 Base) MCG/ACT inhaler  Commonly known as:  PROAIR HFA  Inhale 2 puffs into the lungs 2 (two) times daily.     amLODipine 10 MG tablet  Commonly known as:  NORVASC  Take 1 tablet by mouth  daily     aspirin 81 MG EC tablet  Take 81 mg by mouth daily.     beclomethasone 80 MCG/ACT inhaler  Commonly known as:  QVAR  Inhale 2 puffs into the lungs 2 (two) times daily.     benazepril 40 MG tablet  Commonly known as:  LOTENSIN  1 tab po qd     carvedilol 25 MG tablet  Commonly known as:  COREG  Take 1 tablet (25 mg total) by mouth 2  (two) times daily with a meal.     cefUROXime 250 MG tablet  Commonly known as:  CEFTIN  Take 1 tablet (250 mg total) by mouth 2 (two) times daily with a meal.  Start taking on:  07/27/2015     Cholecalciferol 1000 units tablet  Take 2,000 Units by mouth daily.     docusate sodium 100 MG capsule  Commonly known as:  COLACE  Take 1 capsule (100 mg total) by mouth 2 (two) times daily.     ferrous sulfate 325 (65 FE) MG tablet  Take 1 tablet (325 mg total) by mouth daily with breakfast.     furosemide 20 MG tablet  Commonly known as:  LASIX  Take 1 tablet by mouth  every morning     hydrALAZINE 50 MG tablet  Commonly known as:  APRESOLINE  Take 1 tablet (50 mg total) by mouth 3 (three) times daily.     indomethacin 25 MG capsule  Commonly known as:  INDOCIN  TAKE TWO CAPSULES BY MOUTH THREE TIMES DAILY AS NEEDED FOR  GOUT  ATTACKS     loperamide 2 MG capsule  Commonly known as:  IMODIUM  Take 1 capsule by mouth 4  times daily as needed for  diarrhea or loose stools     meloxicam 15 MG tablet  Commonly known as:  MOBIC  Take 1 tablet (15 mg total) by mouth daily.     metFORMIN 500 MG tablet  Commonly known as:  GLUCOPHAGE  Take 1 tablet by mouth  twice a day     omeprazole 20 MG capsule  Commonly known as:  PRILOSEC  Take 1 capsule by mouth  daily     pravastatin 80 MG tablet  Commonly known as:  PRAVACHOL  Take 1 tablet (80 mg total) by mouth at bedtime.     promethazine 25 MG tablet  Commonly known as:  PHENERGAN  Take 1 tablet (25 mg total) by mouth every 8 (eight) hours as needed for nausea or vomiting.     ranitidine 300 MG tablet  Commonly known as:  ZANTAC  Take 1 tablet by mouth  every evening     tadalafil 20 MG tablet  Commonly known as:  CIALIS  Take as directed     Vitamin B-12 1000 MCG Subl  Place 1 tablet (1,000 mcg total) under the tongue daily.     zolpidem 5 MG tablet  Commonly known as:  AMBIEN  Take 1 tablet (5 mg total) by mouth at  bedtime as needed for sleep.       No Known Allergies Follow-up Information    Follow up with [facility].   Why:  Civil engineer, contracting, [employer] and Games developer information:   [address]  [place] [address] [phone]       Follow up with [facility].   Why:  Physical and Occupational Therapy   Contact information:   [address] [place] [address] [phone]       Follow up with Walker Kehr, MD. Schedule an appointment as soon as possible for a visit in 2 weeks.   Specialty:  Internal Medicine   Why:  Hospital follow up   Contact information:   [place] [address] [phone]        The results of significant diagnostics from this hospitalization (including imaging, microbiology, ancillary and laboratory) are listed below for reference.    Significant Diagnostic Studies: Ct Head Wo Contrast  07/22/2015  CLINICAL DATA:  Weakness and dizziness for 1 day, unsteady gait, diabetes mellitus, hypertension, coronary artery disease post CABG, former smoker EXAM: CT HEAD WITHOUT CONTRAST TECHNIQUE: Contiguous axial images were obtained from the base of the skull through the vertex without intravenous contrast. COMPARISON:  None FINDINGS: Generalized atrophy. Normal ventricular morphology. No midline shift or mass effect. Otherwise normal appearance of brain parenchyma. No intracranial hemorrhage, mass lesion or evidence acute infarction. No extra-axial fluid collections. Visualized paranasal sinuses and mastoid air cells clear. Osseous structures unremarkable. IMPRESSION: Generalized atrophy. No acute intracranial abnormalities. Electronically Signed   By: Lavonia Dana M.D.   On: 07/22/2015 14:13   Mr Brain Wo Contrast  07/23/2015  CLINICAL DATA:  Increased generalized weakness. Shortness of breath on exertion. Dizziness. EXAM: MRI HEAD WITHOUT CONTRAST TECHNIQUE: Multiplanar, multiecho pulse sequences of  the brain and surrounding structures were obtained without intravenous contrast. COMPARISON:  CT head 07/22/2015. FINDINGS: No acute stroke or hemorrhage. Global atrophy with hydrocephalus ex vacuo. No mass lesion, or extra-axial fluid. Minor white matter disease, nonspecific. Flow voids are maintained. No midline abnormality. No extracranial soft tissue abnormality of significance. Visualized upper cervical region unremarkable. IMPRESSION: Atrophy. Minor white matter disease. No acute intracranial findings. Specifically, no cause seen for lower extremity weakness. Electronically Signed   By: Staci Righter M.D.   On: 07/23/2015 10:31   Dg Chest Port 1 View  07/23/2015  CLINICAL DATA:  75 year old male with history of shortness of breath. Prior history of open heart surgery. EXAM: PORTABLE CHEST 1 VIEW COMPARISON:  Chest x-ray 07/22/2015. FINDINGS: Lung volumes are normal. No consolidative airspace disease. No pleural effusions. No pneumothorax. No pulmonary nodule or mass noted. Pulmonary vasculature and the cardiomediastinal silhouette are within normal limits. Atherosclerosis in the thoracic aorta. Status post median sternotomy for CABG. IMPRESSION: 1. No radiographic evidence of acute cardiopulmonary disease. 2. Atherosclerosis. Electronically Signed   By: Vinnie Langton M.D.   On: 07/23/2015 14:44   Dg Chest Port 1 View  07/22/2015  CLINICAL DATA:  Weakness and dizziness EXAM: PORTABLE CHEST 1 VIEW COMPARISON:  09/26/2010 FINDINGS: Status post CABG. Normal heart size and mediastinal contours. There is no edema, consolidation, effusion, or pneumothorax. IMPRESSION: Stable.  No active disease. Electronically Signed   By: Monte Fantasia M.D.   On: 07/22/2015 13:26    Microbiology: Recent Results (from the past 240 hour(s))  Culture, blood (single)     Status: None (Preliminary result)   Collection Time: 07/23/15  6:45 AM  Result Value Ref Range Status   Specimen Description BLOOD LEFT ASSIST  CONTROL  Final   Special Requests BOTTLES DRAWN AEROBIC AND ANAEROBIC 10CC EA  Final   Culture NO GROWTH 2 DAYS  Final   Report Status  PENDING  Incomplete  Urine culture     Status: Abnormal   Collection Time: 07/23/15  5:10 PM  Result Value Ref Range Status   Specimen Description URINE, RANDOM  Final   Special Requests NONE  Final   Culture 1,000 COLONIES/mL INSIGNIFICANT GROWTH (A)  Final   Report Status 07/24/2015 FINAL  Final     Labs: Basic Metabolic Panel:  Recent Labs Lab 07/22/15 1317 07/23/15 0411 07/24/15 0530 07/25/15 0412 07/26/15 0433  NA 140 142 139 141 142  K 3.5 3.0* 3.2* 3.6 4.0  CL 102 103 102 104 106  CO2 26 26 25 26 24   GLUCOSE 119* 151* 138* 147* 134*  BUN 9 9 13 16 17   CREATININE 1.20 1.26* 1.44* 1.41* 1.14  CALCIUM 9.4 8.9 8.6* 8.5* 8.5*   Liver Function Tests:  Recent Labs Lab 07/22/15 1317 07/23/15 0411  AST 20 15  ALT 14* 12*  ALKPHOS 68 61  BILITOT 0.7 0.8  PROT 7.3 6.6  ALBUMIN 4.0 3.5   No results for input(s): LIPASE, AMYLASE in the last 168 hours. No results for input(s): AMMONIA in the last 168 hours. CBC:  Recent Labs Lab 07/22/15 1317 07/23/15 0411 07/24/15 0530 07/25/15 0412 07/26/15 0433 07/26/15 1225  WBC 14.1* 12.4* 17.6* 10.7* 8.7  --   NEUTROABS 10.9*  --   --   --   --   --   HGB 9.9* 9.2* 8.6* 8.2* 7.7* 8.0*  HCT 31.9* 30.9* 28.6* 27.4* 25.1* 25.8*  MCV 69.2* 69.0* 69.1* 68.8* 69.3*  --   PLT 251 202 166 204 PLATELET CLUMPS NOTED ON SMEAR, COUNT APPEARS ADEQUATE  --    Cardiac Enzymes: No results for input(s): CKTOTAL, CKMB, CKMBINDEX, TROPONINI in the last 168 hours. BNP: BNP (last 3 results)  Recent Labs  07/22/15 1317  BNP 126.1*    ProBNP (last 3 results) No results for input(s): PROBNP in the last 8760 hours.  CBG:  Recent Labs Lab 07/24/15 2240 07/25/15 0737 07/25/15 1157 07/25/15 1615 07/25/15 2136  GLUCAP 124* 136* 217* 130* 131*    Signed:  Harl Wiechmann K  [facility] 07/26/2015, 12:50 PM

## 2015-07-27 ENCOUNTER — Telehealth: Payer: Self-pay | Admitting: *Deleted

## 2015-07-27 NOTE — Telephone Encounter (Signed)
Transition Care Management Follow-up Telephone Call   Date discharged? 07/26/15   How have you been since you were released from the hospital? Pt states he is feeling better   Do you understand why you were in the hospital? YES   Do you understand the discharge instructions? YES   Where were you discharged to? Home   Items Reviewed:  Medications reviewed: YES  Allergies reviewed: YES  Dietary changes reviewed: YES. Heart Healthy & Diabetic  Referrals reviewed: Pt states advance has contacted him   Functional Questionnaire:   Activities of Daily Living (ADLs):   He states he are independent in the following: ambulation, bathing and hygiene, feeding, continence, grooming, toileting and dressing States he doesn't require assistance    Any transportation issues/concerns?: NO   Any patient concerns? NO   Confirmed importance and date/time of follow-up visits scheduled YES, pt already had appt for 08/17/15 want to keep same date made appt for 30 mins  Provider Appointment booked with Dr. Walker Kehr   Confirmed with patient if condition begins to worsen call PCP or go to the ER.  Patient was given the office number and encouraged to call back with question or concerns.  : YES

## 2015-07-28 LAB — CULTURE, BLOOD (SINGLE): CULTURE: NO GROWTH

## 2015-08-02 ENCOUNTER — Telehealth: Payer: Self-pay | Admitting: Internal Medicine

## 2015-08-02 NOTE — Telephone Encounter (Signed)
Error

## 2015-08-13 ENCOUNTER — Emergency Department (HOSPITAL_COMMUNITY)
Admission: EM | Admit: 2015-08-13 | Discharge: 2015-08-13 | Disposition: A | Discharge status: Home / Self Care | Payer: Medicare Other | Attending: Emergency Medicine | Admitting: Emergency Medicine

## 2015-08-13 ENCOUNTER — Encounter (HOSPITAL_COMMUNITY): Payer: Self-pay

## 2015-08-13 DIAGNOSIS — K219 Gastro-esophageal reflux disease without esophagitis: Secondary | ICD-10-CM | POA: Insufficient documentation

## 2015-08-13 DIAGNOSIS — I10 Essential (primary) hypertension: Secondary | ICD-10-CM | POA: Diagnosis not present

## 2015-08-13 DIAGNOSIS — Z87891 Personal history of nicotine dependence: Secondary | ICD-10-CM | POA: Diagnosis not present

## 2015-08-13 DIAGNOSIS — Z7982 Long term (current) use of aspirin: Secondary | ICD-10-CM | POA: Diagnosis not present

## 2015-08-13 DIAGNOSIS — I25810 Atherosclerosis of coronary artery bypass graft(s) without angina pectoris: Secondary | ICD-10-CM | POA: Diagnosis not present

## 2015-08-13 DIAGNOSIS — Z7984 Long term (current) use of oral hypoglycemic drugs: Secondary | ICD-10-CM | POA: Diagnosis not present

## 2015-08-13 DIAGNOSIS — N390 Urinary tract infection, site not specified: Secondary | ICD-10-CM | POA: Diagnosis not present

## 2015-08-13 DIAGNOSIS — Z79899 Other long term (current) drug therapy: Secondary | ICD-10-CM | POA: Diagnosis not present

## 2015-08-13 DIAGNOSIS — R531 Weakness: Secondary | ICD-10-CM | POA: Diagnosis present

## 2015-08-13 DIAGNOSIS — Z951 Presence of aortocoronary bypass graft: Secondary | ICD-10-CM | POA: Diagnosis not present

## 2015-08-13 DIAGNOSIS — Z791 Long term (current) use of non-steroidal anti-inflammatories (NSAID): Secondary | ICD-10-CM | POA: Diagnosis not present

## 2015-08-13 DIAGNOSIS — Z87438 Personal history of other diseases of male genital organs: Secondary | ICD-10-CM | POA: Diagnosis not present

## 2015-08-13 DIAGNOSIS — F419 Anxiety disorder, unspecified: Secondary | ICD-10-CM | POA: Diagnosis not present

## 2015-08-13 DIAGNOSIS — M1990 Unspecified osteoarthritis, unspecified site: Secondary | ICD-10-CM | POA: Diagnosis not present

## 2015-08-13 DIAGNOSIS — E119 Type 2 diabetes mellitus without complications: Secondary | ICD-10-CM | POA: Diagnosis not present

## 2015-08-13 DIAGNOSIS — E785 Hyperlipidemia, unspecified: Secondary | ICD-10-CM | POA: Diagnosis not present

## 2015-08-13 DIAGNOSIS — J441 Chronic obstructive pulmonary disease with (acute) exacerbation: Secondary | ICD-10-CM | POA: Diagnosis not present

## 2015-08-13 LAB — GRAM STAIN: Special Requests: NORMAL

## 2015-08-13 LAB — BASIC METABOLIC PANEL
ANION GAP: 12 (ref 5–15)
BUN: 12 mg/dL (ref 6–20)
CO2: 28 mmol/L (ref 22–32)
Calcium: 9.8 mg/dL (ref 8.9–10.3)
Chloride: 101 mmol/L (ref 101–111)
Creatinine, Ser: 1.31 mg/dL — ABNORMAL HIGH (ref 0.61–1.24)
GFR, EST NON AFRICAN AMERICAN: 52 mL/min — AB (ref >60)
GLUCOSE: 149 mg/dL — AB (ref 65–99)
POTASSIUM: 3.8 mmol/L (ref 3.5–5.1)
Sodium: 141 mmol/L (ref 135–145)

## 2015-08-13 LAB — CBC WITH DIFFERENTIAL/PLATELET
BASOS ABS: 0 10*3/uL (ref 0.0–0.1)
Basophils Relative: 0 %
EOS ABS: 0 10*3/uL (ref 0.0–0.7)
Eosinophils Relative: 0 %
HEMATOCRIT: 32.6 % — AB (ref 39.0–52.0)
HEMOGLOBIN: 9.7 g/dL — AB (ref 13.0–17.0)
LYMPHS PCT: 19 %
Lymphs Abs: 1.4 10*3/uL (ref 0.7–4.0)
MCH: 20.9 pg — ABNORMAL LOW (ref 26.0–34.0)
MCHC: 29.8 g/dL — ABNORMAL LOW (ref 30.0–36.0)
MCV: 70.1 fL — ABNORMAL LOW (ref 78.0–100.0)
MONOS PCT: 12 %
Monocytes Absolute: 0.9 10*3/uL (ref 0.1–1.0)
NEUTROS ABS: 5.3 10*3/uL (ref 1.7–7.7)
NEUTROS PCT: 69 %
Platelets: 269 10*3/uL (ref 150–400)
RBC: 4.65 MIL/uL (ref 4.22–5.81)
RDW: 17.4 % — ABNORMAL HIGH (ref 11.5–15.5)
WBC: 7.6 10*3/uL (ref 4.0–10.5)

## 2015-08-13 LAB — URINALYSIS, ROUTINE W REFLEX MICROSCOPIC
Bilirubin Urine: NEGATIVE
Glucose, UA: NEGATIVE mg/dL
KETONES UR: NEGATIVE mg/dL
Nitrite: POSITIVE — AB
PROTEIN: 100 mg/dL — AB
SPECIFIC GRAVITY, URINE: 1.014 (ref 1.005–1.030)
pH: 5.5 (ref 5.0–8.0)

## 2015-08-13 LAB — URINE MICROSCOPIC-ADD ON

## 2015-08-13 LAB — I-STAT CREATININE, ED: CREATININE: 1.2 mg/dL (ref 0.61–1.24)

## 2015-08-13 LAB — CBG MONITORING, ED: GLUCOSE-CAPILLARY: 99 mg/dL (ref 65–99)

## 2015-08-13 MED ORDER — SODIUM CHLORIDE 0.9 % IV BOLUS (SEPSIS)
1000.0000 mL | Freq: Once | INTRAVENOUS | Status: AC
  Administered 2015-08-13: 1000 mL via INTRAVENOUS

## 2015-08-13 MED ORDER — CIPROFLOXACIN HCL 500 MG PO TABS: 500.0000 mg | ORAL_TABLET | Freq: Two times a day (BID) | ORAL | Status: DC

## 2015-08-13 MED ORDER — CIPROFLOXACIN IN D5W 400 MG/200ML IV SOLN
400.0000 mg | Freq: Once | INTRAVENOUS | Status: AC
  Administered 2015-08-13: 400 mg via INTRAVENOUS
  Filled 2015-08-13: qty 200

## 2015-08-13 NOTE — ED Notes (Signed)
Ambulated patient approx. 133ft.  Patient reports unsteady gait but no difficulty walking. Pt heart rate while ambulating was 90bpm lowest SpO2 sat was 93%.

## 2015-08-13 NOTE — ED Notes (Signed)
Pt. Drank 215ml of Ginger Ale.  Reports no nausea.

## 2015-08-13 NOTE — Discharge Instructions (Signed)
You likely have a urinary tract infection causing your symptoms, in addition to being dehydrated. We gave you some IV fluids and will treat your infection with Ciprofloxacin. Please take this for the next 7 days. You will be contacted with regard to your urine culture if you need to change your antibiotic. Please keep well hydrated. If you develop fevers, nausea/vomiting, worsening symptoms, please return for evaluation.

## 2015-08-13 NOTE — ED Notes (Signed)
Patient here with complaint of recurrent bladder infection. Admitted 3 weeks ago for same and after finishing antibiotics the symptoms have returned. Urinary frequency

## 2015-08-13 NOTE — ED Notes (Signed)
Pt verbalized understanding of d/c instructions and has no further questions. Pt stable and NAD.  

## 2015-08-13 NOTE — ED Provider Notes (Signed)
CSN: [id number]     Arrival date & time 08/13/15  1028 History   First MD Initiated Contact with Patient 08/13/15 1139     Chief Complaint  Patient presents with  . possible uti      (Consider location/radiation/quality/duration/timing/severity/associated sxs/prior Treatment) HPI  Patient presents to the [facility] ED with symptoms of feeling off balance since yesterday. He reports similar symptoms last month when he was diagnosed with a UTI. He states he has symptoms generally when he gets up from a seated position. No associated lightheadedness, dizziness or chest pain, palpitations but does feel weak in his legs. He has dyspnea at baseline. He reports no dysuria, frequency or hesitancy. No flank pain. No history of fevers, nausea or vomiting, but does have decreased appetite.   Past Medical History  Diagnosis Date  . Chronic airway obstruction, not elsewhere classified   . Personal history of tobacco use, presenting hazards to health   . Unspecified essential hypertension   . Coronary atherosclerosis of artery bypass graft   . Other and unspecified hyperlipidemia   . Type II or unspecified type diabetes mellitus without mention of complication, not stated as uncontrolled   . Esophageal reflux   . Blood in stool   . Other specified disorder of rectum and anus   . ED (erectile dysfunction)   . Osteoarthrosis, unspecified whether generalized or localized, unspecified site   . Gout, unspecified   . Anxiety state, unspecified    Past Surgical History  Procedure Laterality Date  . Coronary artery bypass graft  97    LIMA to LAD, sequential saphenous vein graft to the first and second diagonal, sequential saphenous vein graft to the intermediate OM and circumflex and SVG to RCA  . Rotator cuff repair    . Knee surgery      BILATERAL   Family History  Problem Relation Age of Onset  . Depression Sister   . Heart disease Sister   . Colon cancer Neg Hx   . Hypertension Other   . Coronary  artery disease Other     Male 1st degree relative <50  . Heart disease Mother 69    CAD  . Mental illness Father     Alzheimer's   Social History  Substance Use Topics  . Smoking status: Former Research scientist (life sciences)  . Smokeless tobacco: Never Used     Comment: Stopped 1997  . Alcohol Use: No     Comment: Stopped 1997    Review of Systems  Constitutional: Negative for chills.  Respiratory: Positive for shortness of breath. Negative for cough, chest tightness and wheezing.   Cardiovascular: Negative for chest pain, palpitations and leg swelling.  Gastrointestinal: Negative for nausea, vomiting, abdominal pain and diarrhea.  Genitourinary: Negative for dysuria, urgency, frequency, hematuria, flank pain and difficulty urinating.  Neurological: Positive for weakness.  All other systems reviewed and are negative.     Allergies  Review of patient's allergies indicates no known allergies.  Home Medications   Prior to Admission medications   Medication Sig Start Date End Date Taking? Authorizing Provider  amLODipine (NORVASC) 10 MG tablet Take 1 tablet by mouth  daily 01/17/15  Yes Aleksei Plotnikov V, MD  aspirin 81 MG EC tablet Take 81 mg by mouth daily.     Yes Historical Provider, MD  benazepril (LOTENSIN) 40 MG tablet 1 tab po qd Patient taking differently: Take 40 mg by mouth daily.  07/19/10  Yes Sherren Mocha, MD  carvedilol (COREG) 25 MG tablet  Take 1 tablet (25 mg total) by mouth 2 (two) times daily with a meal. 02/24/15  Yes Aleksei Plotnikov V, MD  Cholecalciferol 1000 UNITS tablet Take 2,000 Units by mouth daily.     Yes Historical Provider, MD  Cyanocobalamin (VITAMIN B-12) 1000 MCG SUBL Place 1 tablet (1,000 mcg total) under the tongue daily. 03/18/14  Yes Aleksei Plotnikov V, MD  docusate sodium (COLACE) 100 MG capsule Take 1 capsule (100 mg total) by mouth 2 (two) times daily. 07/26/15  Yes Donne Hazel, MD  ferrous sulfate 325 (65 FE) MG tablet Take 1 tablet (325 mg total) by  mouth daily with breakfast. 07/26/15  Yes Donne Hazel, MD  furosemide (LASIX) 20 MG tablet Take 1 tablet by mouth  every morning 01/17/15  Yes Aleksei Plotnikov V, MD  hydrALAZINE (APRESOLINE) 50 MG tablet Take 1 tablet (50 mg total) by mouth 3 (three) times daily. 02/24/15  Yes Aleksei Plotnikov V, MD  loperamide (IMODIUM) 2 MG capsule Take 1 capsule by mouth 4  times daily as needed for  diarrhea or loose stools 01/17/15  Yes Aleksei Plotnikov V, MD  meloxicam (MOBIC) 15 MG tablet Take 1 tablet (15 mg total) by mouth daily. 01/12/14  Yes Lyndal Pulley, DO  metFORMIN (GLUCOPHAGE) 500 MG tablet Take 1 tablet by mouth  twice a day 01/17/15  Yes Aleksei Plotnikov V, MD  omeprazole (PRILOSEC) 20 MG capsule Take 1 capsule by mouth  daily 01/17/15  Yes Aleksei Plotnikov V, MD  pravastatin (PRAVACHOL) 80 MG tablet Take 1 tablet (80 mg total) by mouth at bedtime. 02/24/15  Yes Aleksei Plotnikov V, MD  ranitidine (ZANTAC) 300 MG tablet Take 1 tablet by mouth  every evening 01/17/15  Yes Aleksei Plotnikov V, MD  zolpidem (AMBIEN) 5 MG tablet Take 1 tablet (5 mg total) by mouth at bedtime as needed for sleep. 03/25/13  Yes Biagio Borg, MD  albuterol (PROAIR HFA) 108 (90 BASE) MCG/ACT inhaler Inhale 2 puffs into the lungs 2 (two) times daily. Patient taking differently: Inhale 2 puffs into the lungs every 4 (four) hours as needed for wheezing.  09/26/10   Noralee Space, MD  beclomethasone (QVAR) 80 MCG/ACT inhaler Inhale 2 puffs into the lungs 2 (two) times daily. Patient not taking: Reported on 07/22/2015 09/26/10   Noralee Space, MD  cefUROXime (CEFTIN) 250 MG tablet Take 1 tablet (250 mg total) by mouth 2 (two) times daily with a meal. Patient not taking: Reported on 08/13/2015 07/27/15   Donne Hazel, MD  indomethacin (INDOCIN) 25 MG capsule TAKE TWO CAPSULES BY MOUTH THREE TIMES DAILY AS NEEDED FOR  GOUT  ATTACKS 12/11/11   Cassandria Anger, MD  promethazine (PHENERGAN) 25 MG tablet Take 1 tablet (25 mg  total) by mouth every 8 (eight) hours as needed for nausea or vomiting. 06/16/13   Cassandria Anger, MD  tadalafil (CIALIS) 20 MG tablet Take as directed 09/26/10   Noralee Space, MD   BP 146/70 mmHg  Pulse 78  Temp(Src) 98.6 F (37 C) (Oral)  Resp 26  Wt 85.73 kg  SpO2 95% Physical Exam  Constitutional: He is oriented to person, place, and time. He appears well-developed and well-nourished.  HENT:  Head: Normocephalic.  Mouth/Throat: Mucous membranes are dry.  Eyes: Conjunctivae and EOM are normal. Pupils are equal, round, and reactive to light.  Neck: Normal range of motion. Neck supple.  Cardiovascular: Normal rate, regular rhythm and normal heart sounds.  No murmur heard. Prolonged capillary refill  Pulmonary/Chest: Effort normal and breath sounds normal. No respiratory distress. He has no wheezes.  Abdominal: Soft. Bowel sounds are normal. He exhibits no distension. There is no tenderness. There is no rebound and no guarding.  Musculoskeletal: Normal range of motion. He exhibits no edema or tenderness.  Neurological: He is alert and oriented to person, place, and time. No cranial nerve deficit or sensory deficit. He exhibits normal muscle tone.  Skin: Skin is warm and dry.  Decreased skin turgor   Orthostatic VS for the past 24 hrs:  BP- Lying Pulse- Lying BP- Sitting Pulse- Sitting BP- Standing at 0 minutes Pulse- Standing at 0 minutes  08/13/15 1311 143/71 mmHg 77 143/68 mmHg 79 141/66 mmHg 81      ED Course  Procedures (including critical care time) Labs Review Labs Reviewed  URINALYSIS, ROUTINE W REFLEX MICROSCOPIC (NOT AT [facility]) - Abnormal; Notable for the following:    APPearance TURBID (*)    Hgb urine dipstick SMALL (*)    Protein, ur 100 (*)    Nitrite POSITIVE (*)    Leukocytes, UA LARGE (*)    All other components within normal limits  URINE MICROSCOPIC-ADD ON - Abnormal; Notable for the following:    Squamous Epithelial / LPF 0-5 (*)    Bacteria, UA  MANY (*)    All other components within normal limits  CBC WITH DIFFERENTIAL/PLATELET - Abnormal; Notable for the following:    Hemoglobin 9.7 (*)    HCT 32.6 (*)    MCV 70.1 (*)    MCH 20.9 (*)    MCHC 29.8 (*)    RDW 17.4 (*)    All other components within normal limits  GRAM STAIN  URINE CULTURE  BASIC METABOLIC PANEL  CBG MONITORING, ED    Imaging Review No results found. I have personally reviewed and evaluated these images and lab results as part of my medical decision-making.   EKG Interpretation   Date/Time:  Saturday Aug 13 2015 12:31:44 EDT Ventricular Rate:  76 PR Interval:  191 QRS Duration: 88 QT Interval:  367 QTC Calculation: 413 R Axis:   51 Text Interpretation:  Sinus rhythm No significant change since last  tracing Confirmed by BEATON  MD, ROBERT ([id number]) on 08/13/2015 12:37:20 PM  Also confirmed by Audie Pinto  MD, ROBERT [phone]), editor [place] CT, Leda Gauze  [phone])  on 08/13/2015 1:16:39 PM      MDM   Final diagnoses:  UTI (lower urinary tract infection)   Patient symptoms improved with NS bolus. Patient tolerated PO challenge and ambulated well in the hall. Orthostatic vitals normal and EKG unremarkable. Creatinine improved after bolus to 1.20. Hemoglobin of 9.7 which is above baseline. Urinalysis significant for likely UTI. Gram stain shows gram negative rods. Previous regimen should have treated this, but possible there is resistance to previous regimen. Will start on ciprofloxacin. IV given in the ED with outpatient prescription given for total 10 day course. Questions answered. Patient afebrile and stable for discharge home.  Mariel Aloe, MD 08/13/15 1504  Leonard Schwartz, MD 08/13/15 [phone]

## 2015-08-15 LAB — URINE CULTURE: SPECIAL REQUESTS: NORMAL

## 2015-08-16 ENCOUNTER — Telehealth (HOSPITAL_BASED_OUTPATIENT_CLINIC_OR_DEPARTMENT_OTHER): Payer: Self-pay | Admitting: Emergency Medicine

## 2015-08-16 NOTE — Telephone Encounter (Signed)
Post ED Visit - Positive Culture Follow-up  Culture report reviewed by antimicrobial stewardship pharmacist:  []  Elenor Quinones, Pharm.D. []  Heide Guile, Pharm.D., BCPS []  Parks Neptune, Pharm.D. []  Alycia Rossetti, Pharm.D., BCPS []  [place], [place].D., BCPS, AAHIVP []  [name], Pharm.D., BCPS, AAHIVP [x]  Milus Glazier, Pharm.D. []  Stephens November, Pharm.D.  Positive urine culture Treated with ciprofloxacin, organism sensitive to the same and no further patient follow-up is required at this time.  Hazle Nordmann 08/16/2015, 9:19 AM

## 2015-08-17 ENCOUNTER — Ambulatory Visit (INDEPENDENT_AMBULATORY_CARE_PROVIDER_SITE_OTHER): Payer: Medicare Other | Admitting: Internal Medicine

## 2015-08-17 ENCOUNTER — Encounter: Payer: Self-pay | Admitting: Internal Medicine

## 2015-08-17 VITALS — BP 144/82 | HR 69 | Temp 98.2°F | Wt 184.0 lb

## 2015-08-17 DIAGNOSIS — R0609 Other forms of dyspnea: Secondary | ICD-10-CM

## 2015-08-17 DIAGNOSIS — E876 Hypokalemia: Secondary | ICD-10-CM | POA: Insufficient documentation

## 2015-08-17 DIAGNOSIS — E1151 Type 2 diabetes mellitus with diabetic peripheral angiopathy without gangrene: Secondary | ICD-10-CM | POA: Diagnosis not present

## 2015-08-17 DIAGNOSIS — I10 Essential (primary) hypertension: Secondary | ICD-10-CM

## 2015-08-17 DIAGNOSIS — N3000 Acute cystitis without hematuria: Secondary | ICD-10-CM

## 2015-08-17 DIAGNOSIS — D6489 Other specified anemias: Secondary | ICD-10-CM

## 2015-08-17 MED ORDER — FERROUS SULFATE 325 (65 FE) MG PO TABS: 325.0000 mg | ORAL_TABLET | Freq: Every day | ORAL | Status: DC

## 2015-08-17 MED ORDER — VITAMIN B-12 1000 MCG SL SUBL: 1.0000 | SUBLINGUAL_TABLET | Freq: Every day | SUBLINGUAL | Status: DC

## 2015-08-17 MED ORDER — CYANOCOBALAMIN 1000 MCG/ML IJ SOLN
1000.0000 ug | Freq: Once | INTRAMUSCULAR | Status: AC
  Administered 2015-08-17: 1000 ug via INTRAMUSCULAR

## 2015-08-17 NOTE — Assessment & Plan Note (Signed)
Multifactorial: anemia, CAD, COPD, OA

## 2015-08-17 NOTE — Progress Notes (Signed)
Subjective:  Patient ID: Harold Pittman, male    DOB: July 20, 1940  Age: 75 y.o. MRN: [id number]  CC: No chief complaint on file.   HPI Harold Pittman presents for a post-hosp f/u 4/14-4/18 for urosepsis. Records were reviewed. F/u CAD, DM, HTN  Outpatient Prescriptions Prior to Visit  Medication Sig Dispense Refill  . albuterol (PROAIR HFA) 108 (90 BASE) MCG/ACT inhaler Inhale 2 puffs into the lungs 2 (two) times daily. (Patient taking differently: Inhale 2 puffs into the lungs every 4 (four) hours as needed for wheezing. ) 1 Inhaler 11  . amLODipine (NORVASC) 10 MG tablet Take 1 tablet by mouth  daily 90 tablet 3  . aspirin 81 MG EC tablet Take 81 mg by mouth daily.      . beclomethasone (QVAR) 80 MCG/ACT inhaler Inhale 2 puffs into the lungs 2 (two) times daily. 1 Inhaler 11  . benazepril (LOTENSIN) 40 MG tablet 1 tab po qd (Patient taking differently: Take 40 mg by mouth daily. ) 90 tablet 3  . carvedilol (COREG) 25 MG tablet Take 1 tablet (25 mg total) by mouth 2 (two) times daily with a meal. 180 tablet 3  . Cholecalciferol 1000 UNITS tablet Take 2,000 Units by mouth daily.      . ciprofloxacin (CIPRO) 500 MG tablet Take 1 tablet (500 mg total) by mouth 2 (two) times daily. 13 tablet 0  . Cyanocobalamin (VITAMIN B-12) 1000 MCG SUBL Place 1 tablet (1,000 mcg total) under the tongue daily. 100 tablet 3  . docusate sodium (COLACE) 100 MG capsule Take 1 capsule (100 mg total) by mouth 2 (two) times daily. 10 capsule 0  . ferrous sulfate 325 (65 FE) MG tablet Take 1 tablet (325 mg total) by mouth daily with breakfast. 30 tablet 1  . furosemide (LASIX) 20 MG tablet Take 1 tablet by mouth  every morning 90 tablet 3  . hydrALAZINE (APRESOLINE) 50 MG tablet Take 1 tablet (50 mg total) by mouth 3 (three) times daily. 270 tablet 3  . indomethacin (INDOCIN) 25 MG capsule TAKE TWO CAPSULES BY MOUTH THREE TIMES DAILY AS NEEDED FOR  GOUT  ATTACKS 90 capsule 3  . loperamide (IMODIUM) 2 MG capsule  Take 1 capsule by mouth 4  times daily as needed for  diarrhea or loose stools 180 capsule 3  . meloxicam (MOBIC) 15 MG tablet Take 1 tablet (15 mg total) by mouth daily. 30 tablet 0  . metFORMIN (GLUCOPHAGE) 500 MG tablet Take 1 tablet by mouth  twice a day 180 tablet 3  . omeprazole (PRILOSEC) 20 MG capsule Take 1 capsule by mouth  daily 90 capsule 3  . pravastatin (PRAVACHOL) 80 MG tablet Take 1 tablet (80 mg total) by mouth at bedtime. 90 tablet 3  . promethazine (PHENERGAN) 25 MG tablet Take 1 tablet (25 mg total) by mouth every 8 (eight) hours as needed for nausea or vomiting. 30 tablet 0  . ranitidine (ZANTAC) 300 MG tablet Take 1 tablet by mouth  every evening 90 tablet 3  . tadalafil (CIALIS) 20 MG tablet Take as directed 10 tablet 5  . zolpidem (AMBIEN) 5 MG tablet Take 1 tablet (5 mg total) by mouth at bedtime as needed for sleep. 30 tablet 5   No facility-administered medications prior to visit.    ROS Review of Systems  Constitutional: Positive for fatigue. Negative for appetite change and unexpected weight change.  HENT: Negative for congestion, nosebleeds, sneezing, sore throat and trouble swallowing.  Eyes: Negative for itching and visual disturbance.  Respiratory: Positive for shortness of breath. Negative for cough.   Cardiovascular: Negative for chest pain, palpitations and leg swelling.  Gastrointestinal: Negative for nausea, diarrhea, blood in stool and abdominal distention.  Genitourinary: Negative for frequency and hematuria.  Musculoskeletal: Positive for back pain, arthralgias and gait problem. Negative for joint swelling and neck pain.  Skin: Negative for rash.  Neurological: Negative for dizziness, tremors, speech difficulty, weakness and light-headedness.  Psychiatric/Behavioral: Negative for sleep disturbance, dysphoric mood and agitation. The patient is not nervous/anxious.     Objective:  BP 144/82 mmHg  Pulse 69  Temp(Src) 98.2 F (36.8 C) (Oral)  Wt  184 lb (83.462 kg)  SpO2 96%  BP Readings from Last 3 Encounters:  08/17/15 144/82  08/13/15 138/68  07/26/15 126/59    Wt Readings from Last 3 Encounters:  08/17/15 184 lb (83.462 kg)  08/13/15 189 lb (85.73 kg)  07/26/15 189 lb 11.2 oz (86.047 kg)    Physical Exam  Constitutional: He is oriented to person, place, and time. He appears well-developed. No distress.  NAD  HENT:  Mouth/Throat: Oropharynx is clear and moist.  Eyes: Conjunctivae are normal. Pupils are equal, round, and reactive to light.  Neck: Normal range of motion. No JVD present. No thyromegaly present.  Cardiovascular: Normal rate, regular rhythm, normal heart sounds and intact distal pulses.  Exam reveals no gallop and no friction rub.   No murmur heard. Pulmonary/Chest: Effort normal and breath sounds normal. No respiratory distress. He has no wheezes. He has no rales. He exhibits no tenderness.  Abdominal: Soft. Bowel sounds are normal. He exhibits no distension and no mass. There is no tenderness. There is no rebound and no guarding.  Musculoskeletal: Normal range of motion. He exhibits tenderness. He exhibits no edema.  Lymphadenopathy:    He has no cervical adenopathy.  Neurological: He is alert and oriented to person, place, and time. He has normal reflexes. No cranial nerve deficit. He exhibits normal muscle tone. He displays a negative Romberg sign. Coordination abnormal. Gait normal.  Skin: Skin is warm and dry. No rash noted.  Psychiatric: He has a normal mood and affect. His behavior is normal. Judgment and thought content normal.  Cane  Lab Results  Component Value Date   WBC 7.6 08/13/2015   HGB 9.7* 08/13/2015   HCT 32.6* 08/13/2015   PLT 269 08/13/2015   GLUCOSE 149* 08/13/2015   CHOL 103 07/23/2015   TRIG 57 07/23/2015   HDL 31* 07/23/2015   LDLCALC 61 07/23/2015   ALT 12* 07/23/2015   AST 15 07/23/2015   NA 141 08/13/2015   K 3.8 08/13/2015   CL 101 08/13/2015   CREATININE 1.20  08/13/2015   BUN 12 08/13/2015   CO2 28 08/13/2015   TSH 0.91 10/08/2011   PSA 1.31 03/22/2010   INR 1.35 07/23/2015   HGBA1C 6.9* 07/22/2015   MICROALBUR 38.7* 11/24/2013    No results found.  Assessment & Plan:   There are no diagnoses linked to this encounter. I am having Mr. Ficco maintain his aspirin, Cholecalciferol, benazepril, albuterol, beclomethasone, tadalafil, indomethacin, zolpidem, promethazine, meloxicam, Vitamin B-12, ranitidine, amLODipine, loperamide, furosemide, omeprazole, metFORMIN, carvedilol, pravastatin, hydrALAZINE, ferrous sulfate, docusate sodium, ciprofloxacin, and cefUROXime.  Meds ordered this encounter  Medications  . cefUROXime (CEFTIN) 250 MG tablet    Sig: Take 1 tablet by mouth 2 (two) times daily.    Refill:  0     Follow-up: No Follow-up  on file.  Walker Kehr, MD

## 2015-08-17 NOTE — Assessment & Plan Note (Addendum)
Chronic: low iron, low B12, elev creat Colon due 2019 Start Iron, B12

## 2015-08-17 NOTE — Assessment & Plan Note (Signed)
BP is nl at home Lotensin, Coreg, Hydralazine, Lasix Rx 

## 2015-08-17 NOTE — Assessment & Plan Note (Signed)
Ampicillin resistant E coli 4/17 - urosepsis UA

## 2015-08-17 NOTE — Progress Notes (Signed)
Pre visit review using our clinic review tool, if applicable. No additional management support is needed unless otherwise documented below in the visit note. 

## 2015-08-17 NOTE — Assessment & Plan Note (Addendum)
Start  Klor-con if needed

## 2015-08-17 NOTE — Assessment & Plan Note (Signed)
Chronic w/PVD, CAD Metformin, Pravastatin, ASA, Coreg, Lotensin 

## 2015-08-18 DIAGNOSIS — M1712 Unilateral primary osteoarthritis, left knee: Secondary | ICD-10-CM | POA: Diagnosis not present

## 2015-08-18 DIAGNOSIS — M1711 Unilateral primary osteoarthritis, right knee: Secondary | ICD-10-CM | POA: Diagnosis not present

## 2015-08-18 DIAGNOSIS — M5136 Other intervertebral disc degeneration, lumbar region: Secondary | ICD-10-CM | POA: Diagnosis not present

## 2015-08-18 DIAGNOSIS — M545 Low back pain: Secondary | ICD-10-CM | POA: Diagnosis not present

## 2015-09-21 DIAGNOSIS — E119 Type 2 diabetes mellitus without complications: Secondary | ICD-10-CM | POA: Diagnosis not present

## 2015-09-28 ENCOUNTER — Ambulatory Visit (INDEPENDENT_AMBULATORY_CARE_PROVIDER_SITE_OTHER): Payer: Medicare Other | Admitting: Internal Medicine

## 2015-09-28 ENCOUNTER — Encounter: Payer: Self-pay | Admitting: Internal Medicine

## 2015-09-28 VITALS — BP 138/78 | HR 71 | Wt 194.0 lb

## 2015-09-28 DIAGNOSIS — E1151 Type 2 diabetes mellitus with diabetic peripheral angiopathy without gangrene: Secondary | ICD-10-CM

## 2015-09-28 DIAGNOSIS — E785 Hyperlipidemia, unspecified: Secondary | ICD-10-CM

## 2015-09-28 DIAGNOSIS — I25810 Atherosclerosis of coronary artery bypass graft(s) without angina pectoris: Secondary | ICD-10-CM

## 2015-09-28 DIAGNOSIS — I10 Essential (primary) hypertension: Secondary | ICD-10-CM

## 2015-09-28 NOTE — Assessment & Plan Note (Signed)
Lotensin, Coreg, Hydralazine, Lasix Rx

## 2015-09-28 NOTE — Progress Notes (Signed)
Pre visit review using our clinic review tool, if applicable. No additional management support is needed unless otherwise documented below in the visit note. 

## 2015-09-28 NOTE — Assessment & Plan Note (Signed)
Metformin, Pravastatin, ASA, Coreg, Lotensin Labs

## 2015-09-28 NOTE — Progress Notes (Signed)
Subjective:  Patient ID: Harold Pittman, male    DOB: 1940-12-26  Age: 75 y.o. MRN: [id number]  CC: No chief complaint on file.   HPI Harold Pittman presents for CAD, HTN, DM f/u  Outpatient Prescriptions Prior to Visit  Medication Sig Dispense Refill  . albuterol (PROAIR HFA) 108 (90 BASE) MCG/ACT inhaler Inhale 2 puffs into the lungs 2 (two) times daily. (Patient taking differently: Inhale 2 puffs into the lungs every 4 (four) hours as needed for wheezing. ) 1 Inhaler 11  . amLODipine (NORVASC) 10 MG tablet Take 1 tablet by mouth  daily 90 tablet 3  . aspirin 81 MG EC tablet Take 81 mg by mouth daily.      . beclomethasone (QVAR) 80 MCG/ACT inhaler Inhale 2 puffs into the lungs 2 (two) times daily. 1 Inhaler 11  . benazepril (LOTENSIN) 40 MG tablet 1 tab po qd (Patient taking differently: Take 40 mg by mouth daily. ) 90 tablet 3  . carvedilol (COREG) 25 MG tablet Take 1 tablet (25 mg total) by mouth 2 (two) times daily with a meal. 180 tablet 3  . cefUROXime (CEFTIN) 250 MG tablet Take 1 tablet by mouth 2 (two) times daily.  0  . Cholecalciferol 1000 UNITS tablet Take 2,000 Units by mouth daily.      . Cyanocobalamin (VITAMIN B-12) 1000 MCG SUBL Place 1 tablet (1,000 mcg total) under the tongue daily. 100 tablet 3  . docusate sodium (COLACE) 100 MG capsule Take 1 capsule (100 mg total) by mouth 2 (two) times daily. 10 capsule 0  . ferrous sulfate 325 (65 FE) MG tablet Take 1 tablet (325 mg total) by mouth daily. 30 tablet 6  . furosemide (LASIX) 20 MG tablet Take 1 tablet by mouth  every morning 90 tablet 3  . hydrALAZINE (APRESOLINE) 50 MG tablet Take 1 tablet (50 mg total) by mouth 3 (three) times daily. 270 tablet 3  . indomethacin (INDOCIN) 25 MG capsule TAKE TWO CAPSULES BY MOUTH THREE TIMES DAILY AS NEEDED FOR  GOUT  ATTACKS 90 capsule 3  . loperamide (IMODIUM) 2 MG capsule Take 1 capsule by mouth 4  times daily as needed for  diarrhea or loose stools 180 capsule 3  . meloxicam  (MOBIC) 15 MG tablet Take 1 tablet (15 mg total) by mouth daily. 30 tablet 0  . metFORMIN (GLUCOPHAGE) 500 MG tablet Take 1 tablet by mouth  twice a day 180 tablet 3  . omeprazole (PRILOSEC) 20 MG capsule Take 1 capsule by mouth  daily 90 capsule 3  . pravastatin (PRAVACHOL) 80 MG tablet Take 1 tablet (80 mg total) by mouth at bedtime. 90 tablet 3  . promethazine (PHENERGAN) 25 MG tablet Take 1 tablet (25 mg total) by mouth every 8 (eight) hours as needed for nausea or vomiting. 30 tablet 0  . ranitidine (ZANTAC) 300 MG tablet Take 1 tablet by mouth  every evening 90 tablet 3  . tadalafil (CIALIS) 20 MG tablet Take as directed 10 tablet 5  . zolpidem (AMBIEN) 5 MG tablet Take 1 tablet (5 mg total) by mouth at bedtime as needed for sleep. 30 tablet 5   No facility-administered medications prior to visit.    ROS Review of Systems  Constitutional: Negative for appetite change, fatigue and unexpected weight change.  HENT: Negative for congestion, nosebleeds, sneezing, sore throat and trouble swallowing.   Eyes: Negative for itching and visual disturbance.  Respiratory: Negative for cough.   Cardiovascular: Negative  for chest pain, palpitations and leg swelling.  Gastrointestinal: Negative for nausea, diarrhea, blood in stool and abdominal distention.  Genitourinary: Negative for frequency and hematuria.  Musculoskeletal: Negative for back pain, joint swelling, gait problem and neck pain.  Skin: Negative for rash.  Neurological: Negative for dizziness, tremors, speech difficulty and weakness.  Psychiatric/Behavioral: Negative for sleep disturbance, dysphoric mood and agitation. The patient is not nervous/anxious.     Objective:  BP 138/78 mmHg  Pulse 71  Wt 194 lb (87.998 kg)  SpO2 96%  BP Readings from Last 3 Encounters:  09/28/15 138/78  08/17/15 144/82  08/13/15 138/68    Wt Readings from Last 3 Encounters:  09/28/15 194 lb (87.998 kg)  08/17/15 184 lb (83.462 kg)  08/13/15  189 lb (85.73 kg)    Physical Exam  Constitutional: He is oriented to person, place, and time. He appears well-developed. No distress.  NAD  HENT:  Mouth/Throat: Oropharynx is clear and moist.  Eyes: Conjunctivae are normal. Pupils are equal, round, and reactive to light.  Neck: Normal range of motion. No JVD present. No thyromegaly present.  Cardiovascular: Normal rate, regular rhythm, normal heart sounds and intact distal pulses.  Exam reveals no gallop and no friction rub.   No murmur heard. Pulmonary/Chest: Effort normal and breath sounds normal. No respiratory distress. He has no wheezes. He has no rales. He exhibits no tenderness.  Abdominal: Soft. Bowel sounds are normal. He exhibits no distension and no mass. There is no tenderness. There is no rebound and no guarding.  Musculoskeletal: Normal range of motion. He exhibits no edema or tenderness.  Lymphadenopathy:    He has no cervical adenopathy.  Neurological: He is alert and oriented to person, place, and time. He has normal reflexes. No cranial nerve deficit. He exhibits normal muscle tone. He displays a negative Romberg sign. Coordination and gait normal.  Skin: Skin is warm and dry. No rash noted.  Psychiatric: He has a normal mood and affect. His behavior is normal. Judgment and thought content normal.    Lab Results  Component Value Date   WBC 7.6 08/13/2015   HGB 9.7* 08/13/2015   HCT 32.6* 08/13/2015   PLT 269 08/13/2015   GLUCOSE 149* 08/13/2015   CHOL 103 07/23/2015   TRIG 57 07/23/2015   HDL 31* 07/23/2015   LDLCALC 61 07/23/2015   ALT 12* 07/23/2015   AST 15 07/23/2015   NA 141 08/13/2015   K 3.8 08/13/2015   CL 101 08/13/2015   CREATININE 1.20 08/13/2015   BUN 12 08/13/2015   CO2 28 08/13/2015   TSH 0.91 10/08/2011   PSA 1.31 03/22/2010   INR 1.35 07/23/2015   HGBA1C 6.9* 07/22/2015   MICROALBUR 38.7* 11/24/2013    No results found.  Assessment & Plan:   There are no diagnoses linked to this  encounter. I am having Mr. Pule maintain his aspirin, Cholecalciferol, benazepril, albuterol, beclomethasone, tadalafil, indomethacin, zolpidem, promethazine, meloxicam, ranitidine, amLODipine, loperamide, furosemide, omeprazole, metFORMIN, carvedilol, pravastatin, hydrALAZINE, docusate sodium, cefUROXime, ferrous sulfate, and Vitamin B-12.  No orders of the defined types were placed in this encounter.     Follow-up: No Follow-up on file.  Walker Kehr, MD

## 2015-09-28 NOTE — Assessment & Plan Note (Signed)
ASA, Pravachol, Coreg, Norvasc Rx No angina 

## 2015-09-28 NOTE — Assessment & Plan Note (Signed)
Pravachol

## 2015-10-28 ENCOUNTER — Telehealth: Payer: Self-pay

## 2015-11-01 NOTE — Progress Notes (Addendum)
Subjective:   Harold Pittman is a 75 y.o. male who presents for Medicare Annual/Subsequent preventive examination.  HRA assessment completed during this visit with Harold Pittman   The Patient was informed that the wellness visit is to identify future health risk and educate and initiate measures that can reduce risk for increased disease through the lifespan.    Describes health has good Takes his medicine; follows up as recommended   NO ROS; Medicare Wellness Visit Last OV:  09/28/2015 Labs completed: Hospital labs  12/14/2014 Lipids; chol 120; Trig 127; HDL 32; LDL 63 Hx CABG; quit smoking 97;  Knee surgery  Rotator cuff surgery   BS in am 165;   Psychosocial: wife passed in 07; never remarried  4 children; 4 grand children;  6 great grand;  Hangs out with children and grandchildren; watches ballgames  Loves to see kids   Medications reviewed for issues; compliance;  Advised to take B12 and Vit d   Typical day; Harold Pittman gets out and goes; is not sedentary  BMI: 25 Diet;  breakfast; cereal; coffee Lunch; peanut butter and crackers;  Supper; fry some chicken; beans;  Goes out to eat often;   Teeth or Denture issues? No dental  Can go to [facility] for teeth cleaning  Exercise; Has 2 yards Harold Pittman cut;   Out and about; moving;  Discussed pedometer to track walking and set goals; this is something Harold Pittman is willing to do;  Feels sob; get out of shower; doing some activity;  No chest pain; will call and make apt with Dr. Burt Knack as Harold Pittman seen him in 07/2014;  Time for annual and will make apt.  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 No  Fall hx; no  UA or BOWEL incontinence; no  Functional  losses from last year to this year? no Given education on "Fall Prevention in the Home" for more safety tips the patient can apply as appropriate.   Risk for Depression reviewed: Any emotional problems? Anxious, depressed, irritable, sad or blue? No  Denies feeling depressed or hopeless; voices pleasure in daily life  Who would help you with chores; illness; shopping other? Harold Pittman   Sleep: sometimes sleeps good and sometimes not but overall gets his rest; may nap during the day if tired   Cognitive; no issues verbalized;  Manages checkbook, medications; no failures of task; appropriate engagement at [place] score reviewed for issues;  Issues making decisions; no  Less interest in hobbies / activities" no  Repeats questions, stories; Harold Pittman complaining: NO  Trouble using ordinary gadgets; microwave; computer: no  Forgets the month or year: no  Mismanaging finances: no  Missing apt: no but does write them down  Daily problems with thinking of memory NO Ad8 score is 0   Advanced Directive addressed; will talk to Harold Pittman and Harold Pittman; Have completed on paper but will discuss with his children   Counseling Health Maintenance Gaps: Foot; check done by nurse from insurance company Simple foot exam neg today    Colonoscopy; 02/24/2008; Dr. Alain Marion 02/2018  EKG: 08/12/2015  Prostate cancer screening: no issues verbalized    Hearing: has hearing aids Does not wear; needs another one  Ophthalmology exam; Need  one; will schedule  [facility]; checked for glaucoma but it has been awhile. Will make apt with them soon.  Immunizations Due: (Vaccines reviewed and educated regarding any overdue)    Established and updated Risk reviewed and appropriate referral made or health recommendations: Thinks Harold Pittman had shingles; will consider taking zostavax but will check with insurance under part D; Was told by carrier that Harold Pittman did not have coverage for Zostavax  Call go to the pharmacy  and check there for coverage and oop.  Current Care Team reviewed and updated       Barriers to Success None noted    Cardiac Risk Factors include: advanced age (>45men, >55 women);diabetes mellitus;dyslipidemia;Harold Pittman history of premature cardiovascular disease;hypertension;male gender     Objective:    Vitals: BP 130/70   Ht 6\' 1"  (1.854 m)   Wt 192 lb (87.1 kg)   BMI 25.33 kg/m   Body mass index is 25.33 kg/m.  Tobacco History  Smoking Status  . Former Smoker  Smokeless Tobacco  . Never Used    Comment: Stopped 1997     Counseling given: Not Answered   Past Medical History:  Diagnosis Date  . Anxiety state, unspecified   . Blood in stool   . Chronic airway obstruction, not elsewhere classified   . Coronary atherosclerosis of artery bypass graft   . ED (erectile dysfunction)   . Esophageal reflux   . Gout, unspecified   . Osteoarthrosis, unspecified whether generalized or localized, unspecified site   . Other and unspecified hyperlipidemia   . Other specified disorder of rectum and anus   . Personal history of tobacco use, presenting hazards to health   . Type II or unspecified type diabetes mellitus without mention of complication, not stated as uncontrolled   . Unspecified essential hypertension    Past Surgical History:  Procedure Laterality Date  . CORONARY ARTERY BYPASS GRAFT  56   LIMA to LAD, sequential saphenous vein graft to the first and second diagonal, sequential saphenous vein graft to the intermediate OM and circumflex and SVG to RCA  . KNEE SURGERY     BILATERAL  . ROTATOR CUFF REPAIR     Harold Pittman History  Problem Relation Age of Onset  . Depression Sister   . Heart disease Sister   . Heart disease Mother 41    CAD  . Mental illness Father     Alzheimer's  . Hypertension Other   . Coronary artery disease Other     Male 1st degree relative <50  . Colon cancer Neg Hx    History  Sexual Activity  . Sexual activity: Not Currently      Outpatient Encounter Prescriptions as of 11/02/2015  Medication Sig  . amLODipine (NORVASC) 10 MG tablet Take 1 tablet by mouth  daily  . aspirin 81 MG EC tablet Take 81 mg by mouth daily.    . benazepril (LOTENSIN) 40 MG tablet 1 tab po qd (Patient taking differently: Take 40 mg by mouth daily. )  . carvedilol (COREG) 25 MG tablet Take 1 tablet (25 mg total) by mouth 2 (two) times daily with a meal.  . Cyanocobalamin (VITAMIN B-12) 1000 MCG SUBL Place 1 tablet (1,000 mcg total) under the tongue daily.  Marland Kitchen docusate sodium (COLACE) 100 MG capsule Take 1 capsule (100 mg total) by mouth 2 (two) times daily.  . ferrous sulfate 325 (65 FE) MG tablet Take 1 tablet (325 mg total) by mouth daily.  . furosemide (  LASIX) 20 MG tablet Take 1 tablet by mouth  every morning  . hydrALAZINE (APRESOLINE) 50 MG tablet Take 1 tablet (50 mg total) by mouth 3 (three) times daily.  . indomethacin (INDOCIN) 25 MG capsule TAKE TWO CAPSULES BY MOUTH THREE TIMES DAILY AS NEEDED FOR  GOUT  ATTACKS  . loperamide (IMODIUM) 2 MG capsule Take 1 capsule by mouth 4  times daily as needed for  diarrhea or loose stools  . meloxicam (MOBIC) 15 MG tablet Take 1 tablet (15 mg total) by mouth daily.  . metFORMIN (GLUCOPHAGE) 500 MG tablet Take 1 tablet by mouth  twice a day  . omeprazole (PRILOSEC) 20 MG capsule Take 1 capsule by mouth  daily  . pravastatin (PRAVACHOL) 80 MG tablet Take 1 tablet (80 mg total) by mouth at bedtime.  . promethazine (PHENERGAN) 25 MG tablet Take 1 tablet (25 mg total) by mouth every 8 (eight) hours as needed for nausea or vomiting.  . ranitidine (ZANTAC) 300 MG tablet Take 1 tablet by mouth  every evening  . tadalafil (CIALIS) 20 MG tablet Take as directed  . zolpidem (AMBIEN) 5 MG tablet Take 1 tablet (5 mg total) by mouth at bedtime as needed for sleep.  Marland Kitchen albuterol (PROAIR HFA) 108 (90 BASE) MCG/ACT inhaler Inhale 2 puffs into the lungs 2 (two) times daily. (Patient not taking: Reported on  11/02/2015)  . beclomethasone (QVAR) 80 MCG/ACT inhaler Inhale 2 puffs into the lungs 2 (two) times daily. (Patient not taking: Reported on 11/02/2015)  . cefUROXime (CEFTIN) 250 MG tablet Take 1 tablet by mouth 2 (two) times daily.  . Cholecalciferol 1000 UNITS tablet Take 2,000 Units by mouth daily.     No facility-administered encounter medications on file as of 11/02/2015.     Activities of Daily Living In your present state of health, do you have any difficulty performing the following activities: 11/02/2015 07/22/2015  Hearing? (No Data) N  Vision? N N  Difficulty concentrating or making decisions? N N  Walking or climbing stairs? N Y  Dressing or bathing? N N  Doing errands, shopping? N N  Preparing Food and eating ? N -  Using the Toilet? N -  In the past six months, have you accidently leaked urine? N -  Do you have problems with loss of bowel control? N -  Managing your Medications? N -  Managing your Finances? N -  Housekeeping or managing your Housekeeping? N -  Some recent data might be hidden    Patient Care Team: Cassandria Anger, MD as PCP - General Sherren Mocha, MD (Cardiology)   Assessment:    Risk for CV disease reviewed; including BP; Cholesterol (hdl 31) ; discussed using pedometer to track steps and increase exercise   Diabetes / A1c not drawn as yet; Glucose elevated inpatient Hospital / on no meds  Discussed A1c; taking BS fasting and pre meal and post meal 2 to 3 times a week As well as one at hs; will bring to the doctor at his next fup apt.  Discussed brisk walking x 30 minutes 3 to 5 times a week;   Will see Dr. Burt Knack prior to planning futher exercise  Prostate Screening- deferred  Smoking Cessation; former smoker/ stopped in 97;   Exercise Activities and Dietary recommendations Current Exercise Habits: Home exercise routine, Time (Minutes): 60, Frequency (Times/Week): 4, Weekly Exercise (Minutes/Week): 240, Intensity: Mild (is not sedentary;  this is an estimate but asked him to get a podometer; )  Goals    . Exercise 150 minutes per week (moderate activity)          Will consider getting a podometer from [facility] or drug store and track walking Try to increase 1000 steps per month may have access to walking tracks at ball fields  while watching the grand children       Fall Risk Fall Risk  11/02/2015 08/17/2015 06/08/2014 11/24/2013  Falls in the past year? No No No (No Data)   Depression Screen PHQ 2/9 Scores 11/02/2015 08/17/2015 06/08/2014 11/24/2013  PHQ - 2 Score 0 0 0 0    Cognitive Testing MMSE - Mini Mental State Exam 11/02/2015  Not completed: (No Data)   Ad8 score is 0   Immunization History  Administered Date(s) Administered  . Influenza Split 03/15/2011, 02/08/2012  . Influenza Whole 03/04/2006, 03/04/2007, 03/15/2009, 12/14/2009  . Influenza, High Dose Seasonal PF 03/16/2013, 12/14/2014  . Influenza,inj,Quad PF,36+ Mos 12/17/2013  . Pneumococcal Conjugate-13 09/16/2013  . Pneumococcal Polysaccharide-23 12/20/2004, 03/15/2011  . Tetanus 11/24/2013   Screening Tests Health Maintenance  Topic Date Due  . OPHTHALMOLOGY EXAM  04/14/1950  . ZOSTAVAX  04/13/2001  . FOOT EXAM  11/25/2014  . INFLUENZA VACCINE  11/08/2015  . HEMOGLOBIN A1C  01/21/2016  . COLONOSCOPY  02/23/2018  . TETANUS/TDAP  11/25/2023  . PNA vac Low Risk Adult  Completed      Plan:     Risk for CV disease reviewed; including BP; Cholesterol (hdl 31) ; discussed using pedometer to track steps and increase exercise   Diabetes / A1c not drawn as yet; Glucose elevated inpatient Hospital / on no meds  Discussed A1c; taking BS fasting and pre meal and post meal 2 to 3 times a week As well as one at hs; will bring to the doctor at his next fup apt.  Discussed brisk walking x 30 minutes 3 to 5 times a week;   Educated regarding shingles vaccination and coverage  [facility] for dental cleaning free   Will see Dr. Burt Knack prior to planning futher  exercise  Prostate Screening- deferred  Smoking Cessation; former smoker/ stopped in 97;   Eye exam tbs; Given information regarding assistance with glasses via [employer]  Barriers to Success None noted   During the course of the visit the patient was educated and counseled about the following appropriate screening and preventive services:   Vaccines to include Pneumoccal, Influenza, Hepatitis B, Td, Zostavax, HCV  Electrocardiogram/ deferred cardiology  Cardiovascular Disease/ will make fup apt with cardiology   Colorectal cancer screening/ 02/2018  Diabetes screening/ checking BS am and a couple of times pre largest meal and 2 hours post meal   Prostate Cancer Screening-deferred   Glaucoma screening/ eye exam tbs  Nutrition counseling / reviewed   Smoking cessation counseling/ quit when Harold Pittman had cabg  Patient Instructions (the written plan) was given to the patient.    Wynetta Fines, RN  11/02/2015  Medical screening examination/treatment/procedure(s) were performed by non-physician practitioner and as supervising physician I was immediately available for consultation/collaboration. I agree with above. Walker Kehr, MD

## 2015-11-02 ENCOUNTER — Ambulatory Visit (INDEPENDENT_AMBULATORY_CARE_PROVIDER_SITE_OTHER): Payer: Medicare Other

## 2015-11-02 VITALS — BP 130/70 | Ht 73.0 in | Wt 192.0 lb

## 2015-11-02 DIAGNOSIS — Z0000 Encounter for general adult medical examination without abnormal findings: Secondary | ICD-10-CM

## 2015-11-02 NOTE — Patient Instructions (Addendum)
Mr. Fontaine , Thank you for taking time to come for your Medicare Wellness Visit. I appreciate your ongoing commitment to your health goals. Please review the following plan we discussed and let me know if I can assist you in the future.    Diabetes / Educated regarding  Glucose elevated inpatient Hospital / on Metformin Discussed A1c; taking BS fasting and pre meal and post meal 2 to 3 times a week; bring your bs report to Dr. Alain Marion at the next visit;   Will see Dr. Burt Knack prior to planning futher exercise  Will make his annual apt now due  [facility] [place] - for free hearing aid No reviews  [facility]  [place] [address]  [phone]  [place] for dental cleaning;   Reviewed for annual vision exam; [facility] assistance for eyewear is coordinated through [facility]; Please call Cherlyn Labella at [phone]  Educated to check with insurance regarding coverage of Shingles vaccination on Part D or Part B and may have lower co-pay if provided on the Part D side       ----   These are the goals we discussed: Goals    . Exercise 150 minutes per week (moderate activity)          Will consider getting a podometer from [facility] or drug store and track walking Try to increase 1000 steps per month may have access to walking tracks at ball fields  while watching the grand children        This is a list of the screening recommended for you and due dates:  Health Maintenance  Topic Date Due  . Eye exam for diabetics  04/14/1950  . Shingles Vaccine  04/13/2001  . Complete foot exam   11/25/2014  . Flu Shot  11/08/2015  . Hemoglobin A1C  01/21/2016  . Colon Cancer Screening  02/23/2018  . Tetanus Vaccine  11/25/2023  . Pneumonia vaccines  Completed       Fall Prevention in the Home  Falls can cause injuries. They can happen to people of all ages. There are many things you can do to make your home safe and to help prevent falls.  WHAT CAN I DO  ON THE OUTSIDE OF MY HOME?  Regularly fix the edges of walkways and driveways and fix any cracks.  Remove anything that might make you trip as you walk through a door, such as a raised step or threshold.  Trim any bushes or trees on the path to your home.  Use bright outdoor lighting.  Clear any walking paths of anything that might make someone trip, such as rocks or tools.  Regularly check to see if handrails are loose or broken. Make sure that both sides of any steps have handrails.  Any raised decks and porches should have guardrails on the edges.  Have any leaves, snow, or ice cleared regularly.  Use sand or salt on walking paths during winter.  Clean up any spills in your garage right away. This includes oil or grease spills. WHAT CAN I DO IN THE BATHROOM?   Use night lights.  Install grab bars by the toilet and in the tub and shower. Do not use towel bars as grab bars.  Use non-skid mats or decals in the tub or shower.  If you need to sit down in the shower, use a plastic, non-slip stool.  Keep the floor dry. Clean up any water that spills on  the floor as soon as it happens.  Remove soap buildup in the tub or shower regularly.  Attach bath mats securely with double-sided non-slip rug tape.  Do not have throw rugs and other things on the floor that can make you trip. WHAT CAN I DO IN THE BEDROOM?  Use night lights.  Make sure that you have a light by your bed that is easy to reach.  Do not use any sheets or blankets that are too big for your bed. They should not hang down onto the floor.  Have a firm chair that has side arms. You can use this for support while you get dressed.  Do not have throw rugs and other things on the floor that can make you trip. WHAT CAN I DO IN THE KITCHEN?  Clean up any spills right away.  Avoid walking on wet floors.  Keep items that you use a lot in easy-to-reach places.  If you need to reach something above you, use a strong  step stool that has a grab bar.  Keep electrical cords out of the way.  Do not use floor polish or wax that makes floors slippery. If you must use wax, use non-skid floor wax.  Do not have throw rugs and other things on the floor that can make you trip. WHAT CAN I DO WITH MY STAIRS?  Do not leave any items on the stairs.  Make sure that there are handrails on both sides of the stairs and use them. Fix handrails that are broken or loose. Make sure that handrails are as long as the stairways.  Check any carpeting to make sure that it is firmly attached to the stairs. Fix any carpet that is loose or worn.  Avoid having throw rugs at the top or bottom of the stairs. If you do have throw rugs, attach them to the floor with carpet tape.  Make sure that you have a light switch at the top of the stairs and the bottom of the stairs. If you do not have them, ask someone to add them for you. WHAT ELSE CAN I DO TO HELP PREVENT FALLS?  Wear shoes that:  Do not have high heels.  Have rubber bottoms.  Are comfortable and fit you well.  Are closed at the toe. Do not wear sandals.  If you use a stepladder:  Make sure that it is fully opened. Do not climb a closed stepladder.  Make sure that both sides of the stepladder are locked into place.  Ask someone to hold it for you, if possible.  Clearly mark and make sure that you can see:  Any grab bars or handrails.  First and last steps.  Where the edge of each step is.  Use tools that help you move around (mobility aids) if they are needed. These include:  Canes.  Walkers.  Scooters.  Crutches.  Turn on the lights when you go into a dark area. Replace any light bulbs as soon as they burn out.  Set up your furniture so you have a clear path. Avoid moving your furniture around.  If any of your floors are uneven, fix them.  If there are any pets around you, be aware of where they are.  Review your medicines with your doctor.  Some medicines can make you feel dizzy. This can increase your chance of falling. Ask your doctor what other things that you can do to help prevent falls.   This information is not  intended to replace advice given to you by your health care provider. Make sure you discuss any questions you have with your health care provider.   Document Released: 01/20/2009 Document Revised: 08/10/2014 Document Reviewed: 04/30/2014 Elsevier Interactive Patient Education 2016 [place] Maintenance, Male A healthy lifestyle and preventative care can promote health and wellness.  Maintain regular health, dental, and eye exams.  Eat a healthy diet. Foods like vegetables, fruits, whole grains, low-fat dairy products, and lean protein foods contain the nutrients you need and are low in calories. Decrease your intake of foods high in solid fats, added sugars, and salt. Get information about a proper diet from your health care provider, if necessary.  Regular physical exercise is one of the most important things you can do for your health. Most adults should get at least 150 minutes of moderate-intensity exercise (any activity that increases your heart rate and causes you to sweat) each week. In addition, most adults need muscle-strengthening exercises on 2 or more days a week.   Maintain a healthy weight. The body mass index (BMI) is a screening tool to identify possible weight problems. It provides an estimate of body fat based on height and weight. Your health care provider can find your BMI and can help you achieve or maintain a healthy weight. For males 20 years and older:  A BMI below 18.5 is considered underweight.  A BMI of 18.5 to 24.9 is normal.  A BMI of 25 to 29.9 is considered overweight.  A BMI of 30 and above is considered obese.  Maintain normal blood lipids and cholesterol by exercising and minimizing your intake of saturated fat. Eat a balanced diet with plenty of fruits and  vegetables. Blood tests for lipids and cholesterol should begin at age 26 and be repeated every 5 years. If your lipid or cholesterol levels are high, you are over age 61, or you are at high risk for heart disease, you may need your cholesterol levels checked more frequently.Ongoing high lipid and cholesterol levels should be treated with medicines if diet and exercise are not working.  If you smoke, find out from your health care provider how to quit. If you do not use tobacco, do not start.  Lung cancer screening is recommended for adults aged 51-80 years who are at high risk for developing lung cancer because of a history of smoking. A yearly low-dose CT scan of the lungs is recommended for people who have at least a 30-pack-year history of smoking and are current smokers or have quit within the past 15 years. A pack year of smoking is smoking an average of 1 pack of cigarettes a day for 1 year (for example, a 30-pack-year history of smoking could mean smoking 1 pack a day for 30 years or 2 packs a day for 15 years). Yearly screening should continue until the smoker has stopped smoking for at least 15 years. Yearly screening should be stopped for people who develop a health problem that would prevent them from having lung cancer treatment.  If you choose to drink alcohol, do not have more than 2 drinks per day. One drink is considered to be 12 oz (360 mL) of beer, 5 oz (150 mL) of wine, or 1.5 oz (45 mL) of liquor.  Avoid the use of street drugs. Do not share needles with anyone. Ask for help if you need support or instructions about stopping the use of drugs.  High blood pressure causes heart disease and  increases the risk of stroke. High blood pressure is more likely to develop in:  People who have blood pressure in the end of the normal range (100-139/85-89 mm Hg).  People who are overweight or obese.  People who are African American.  If you are 27-61 years of age, have your blood pressure  checked every 3-5 years. If you are 24 years of age or older, have your blood pressure checked every year. You should have your blood pressure measured twice--once when you are at a hospital or clinic, and once when you are not at a hospital or clinic. Record the average of the two measurements. To check your blood pressure when you are not at a hospital or clinic, you can use:  An automated blood pressure machine at a pharmacy.  A home blood pressure monitor.  If you are 57-80 years old, ask your health care provider if you should take aspirin to prevent heart disease.  Diabetes screening involves taking a blood sample to check your fasting blood sugar level. This should be done once every 3 years after age 64 if you are at a normal weight and without risk factors for diabetes. Testing should be considered at a younger age or be carried out more frequently if you are overweight and have at least 1 risk factor for diabetes.  Colorectal cancer can be detected and often prevented. Most routine colorectal cancer screening begins at the age of 66 and continues through age 30. However, your health care provider may recommend screening at an earlier age if you have risk factors for colon cancer. On a yearly basis, your health care provider may provide home test kits to check for hidden blood in the stool. A small camera at the end of a tube may be used to directly examine the colon (sigmoidoscopy or colonoscopy) to detect the earliest forms of colorectal cancer. Talk to your health care provider about this at age 79 when routine screening begins. A direct exam of the colon should be repeated every 5-10 years through age 33, unless early forms of precancerous polyps or small growths are found.  People who are at an increased risk for hepatitis B should be screened for this virus. You are considered at high risk for hepatitis B if:  You were born in a country where hepatitis B occurs often. Talk with your  health care provider about which countries are considered high risk.  Your parents were born in a high-risk country and you have not received a shot to protect against hepatitis B (hepatitis B vaccine).  You have HIV or AIDS.  You use needles to inject street drugs.  You live with, or have sex with, someone who has hepatitis B.  You are a man who has sex with other men (MSM).  You get hemodialysis treatment.  You take certain medicines for conditions like cancer, organ transplantation, and autoimmune conditions.  Hepatitis C blood testing is recommended for all people born from [birth year] through 1964 and any individual with known risk factors for hepatitis C.  Healthy men should no longer receive prostate-specific antigen (PSA) blood tests as part of routine cancer screening. Talk to your health care provider about prostate cancer screening.  Testicular cancer screening is not recommended for adolescents or adult males who have no symptoms. Screening includes self-exam, a health care provider exam, and other screening tests. Consult with your health care provider about any symptoms you have or any concerns you have about testicular cancer.  Practice safe sex. Use condoms and avoid high-risk sexual practices to reduce the spread of sexually transmitted infections (STIs).  You should be screened for STIs, including gonorrhea and chlamydia if:  You are sexually active and are younger than 24 years.  You are older than 24 years, and your health care provider tells you that you are at risk for this type of infection.  Your sexual activity has changed since you were last screened, and you are at an increased risk for chlamydia or gonorrhea. Ask your health care provider if you are at risk.  If you are at risk of being infected with HIV, it is recommended that you take a prescription medicine daily to prevent HIV infection. This is called pre-exposure prophylaxis (PrEP). You are considered at  risk if:  You are a man who has sex with other men (MSM).  You are a heterosexual man who is sexually active with multiple partners.  You take drugs by injection.  You are sexually active with a partner who has HIV.  Talk with your health care provider about whether you are at high risk of being infected with HIV. If you choose to begin PrEP, you should first be tested for HIV. You should then be tested every 3 months for as long as you are taking PrEP.  Use sunscreen. Apply sunscreen liberally and repeatedly throughout the day. You should seek shade when your shadow is shorter than you. Protect yourself by wearing long sleeves, pants, a wide-brimmed hat, and sunglasses year round whenever you are outdoors.  Tell your health care provider of new moles or changes in moles, especially if there is a change in shape or color. Also, tell your health care provider if a mole is larger than the size of a pencil eraser.  A one-time screening for abdominal aortic aneurysm (AAA) and surgical repair of large AAAs by ultrasound is recommended for men aged 27-75 years who are current or former smokers.  Stay current with your vaccines (immunizations).   This information is not intended to replace advice given to you by your health care provider. Make sure you discuss any questions you have with your health care provider.   Document Released: 09/22/2007 Document Revised: 04/16/2014 Document Reviewed: 08/21/2010 Elsevier Interactive Patient Education [employer].

## 2015-11-21 DIAGNOSIS — H524 Presbyopia: Secondary | ICD-10-CM | POA: Diagnosis not present

## 2015-11-21 DIAGNOSIS — H2512 Age-related nuclear cataract, left eye: Secondary | ICD-10-CM | POA: Diagnosis not present

## 2015-11-21 LAB — HM DIABETES EYE EXAM

## 2015-11-24 ENCOUNTER — Encounter: Payer: Self-pay | Admitting: Internal Medicine

## 2015-12-21 ENCOUNTER — Other Ambulatory Visit: Payer: Self-pay | Admitting: Internal Medicine

## 2015-12-22 NOTE — Telephone Encounter (Signed)
Error - disregard note

## 2016-01-12 ENCOUNTER — Other Ambulatory Visit: Payer: Self-pay | Admitting: Internal Medicine

## 2016-01-30 ENCOUNTER — Ambulatory Visit (INDEPENDENT_AMBULATORY_CARE_PROVIDER_SITE_OTHER): Payer: Medicare Other | Admitting: Internal Medicine

## 2016-01-30 ENCOUNTER — Encounter: Payer: Self-pay | Admitting: Internal Medicine

## 2016-01-30 ENCOUNTER — Other Ambulatory Visit (INDEPENDENT_AMBULATORY_CARE_PROVIDER_SITE_OTHER): Payer: Medicare Other

## 2016-01-30 DIAGNOSIS — E785 Hyperlipidemia, unspecified: Secondary | ICD-10-CM | POA: Diagnosis not present

## 2016-01-30 DIAGNOSIS — I25810 Atherosclerosis of coronary artery bypass graft(s) without angina pectoris: Secondary | ICD-10-CM

## 2016-01-30 DIAGNOSIS — I10 Essential (primary) hypertension: Secondary | ICD-10-CM

## 2016-01-30 DIAGNOSIS — Z23 Encounter for immunization: Secondary | ICD-10-CM | POA: Diagnosis not present

## 2016-01-30 DIAGNOSIS — E1151 Type 2 diabetes mellitus with diabetic peripheral angiopathy without gangrene: Secondary | ICD-10-CM | POA: Diagnosis not present

## 2016-01-30 DIAGNOSIS — D6489 Other specified anemias: Secondary | ICD-10-CM | POA: Diagnosis not present

## 2016-01-30 DIAGNOSIS — M545 Low back pain, unspecified: Secondary | ICD-10-CM

## 2016-01-30 LAB — BASIC METABOLIC PANEL
BUN: 18 mg/dL (ref 6–23)
CALCIUM: 9.5 mg/dL (ref 8.4–10.5)
CO2: 29 mEq/L (ref 19–32)
Chloride: 110 mEq/L (ref 96–112)
Creatinine, Ser: 1.26 mg/dL (ref 0.40–1.50)
GFR: 71.79 mL/min (ref >60.00)
Glucose, Bld: 143 mg/dL — ABNORMAL HIGH (ref 70–99)
POTASSIUM: 4.3 meq/L (ref 3.5–5.1)
SODIUM: 146 meq/L — AB (ref 135–145)

## 2016-01-30 LAB — HEMOGLOBIN A1C: HEMOGLOBIN A1C: 6.3 % (ref 4.6–6.5)

## 2016-01-30 LAB — URINALYSIS, ROUTINE W REFLEX MICROSCOPIC
Bilirubin Urine: NEGATIVE
HGB URINE DIPSTICK: NEGATIVE
Ketones, ur: NEGATIVE
LEUKOCYTES UA: NEGATIVE
Nitrite: NEGATIVE
PH: 6 (ref 5.0–8.0)
RBC / HPF: NONE SEEN (ref 0–?)
Specific Gravity, Urine: 1.02 (ref 1.000–1.030)
TOTAL PROTEIN, URINE-UPE24: 100 — AB
URINE GLUCOSE: NEGATIVE
Urobilinogen, UA: 0.2 (ref 0.0–1.0)
WBC, UA: NONE SEEN (ref 0–?)

## 2016-01-30 LAB — CBC WITH DIFFERENTIAL/PLATELET
BASOS PCT: 0.2 % (ref 0.0–3.0)
Basophils Absolute: 0 10*3/uL (ref 0.0–0.1)
EOS ABS: 0.3 10*3/uL (ref 0.0–0.7)
Eosinophils Relative: 4.2 % (ref 0.0–5.0)
HEMATOCRIT: 28.3 % — AB (ref 39.0–52.0)
Hemoglobin: 9 g/dL — ABNORMAL LOW (ref 13.0–17.0)
Lymphocytes Relative: 19.4 % (ref 12.0–46.0)
Lymphs Abs: 1.4 10*3/uL (ref 0.7–4.0)
MCHC: 31.7 g/dL (ref 30.0–36.0)
MCV: 65.4 fl — ABNORMAL LOW (ref 78.0–100.0)
MONO ABS: 0.8 10*3/uL (ref 0.1–1.0)
Monocytes Relative: 11 % (ref 3.0–12.0)
NEUTROS ABS: 4.6 10*3/uL (ref 1.4–7.7)
Neutrophils Relative %: 65.2 % (ref 43.0–77.0)
PLATELETS: 257 10*3/uL (ref 150.0–400.0)
RBC: 4.33 Mil/uL (ref 4.22–5.81)
RDW: 18.2 % — AB (ref 11.5–15.5)
WBC: 7.1 10*3/uL (ref 4.0–10.5)

## 2016-01-30 MED ORDER — ACETAMINOPHEN-CODEINE 300-30 MG PO TABS: 0.5000 | ORAL_TABLET | Freq: Four times a day (QID) | ORAL | 1 refills | Status: DC | PRN

## 2016-01-30 NOTE — Assessment & Plan Note (Signed)
BP is nl at home Lotensin, Coreg, Hydralazine, Lasix Rx

## 2016-01-30 NOTE — Addendum Note (Signed)
Addended by: Cresenciano Lick on: 01/30/2016 10:01 AM   Modules accepted: Orders

## 2016-01-30 NOTE — Assessment & Plan Note (Signed)
T#3 prn Potential benefits of opioids use as well as potential risks (i.e. addiction risk, apnea etc) and complications (i.e. Somnolence, constipation and others) were explained to the patient and were aknowledged.

## 2016-01-30 NOTE — Assessment & Plan Note (Signed)
ASA, Pravachol, Coreg, Norvasc Rx No angina

## 2016-01-30 NOTE — Assessment & Plan Note (Signed)
Pravachol Labs

## 2016-01-30 NOTE — Progress Notes (Signed)
Pre visit review using our clinic review tool, if applicable. No additional management support is needed unless otherwise documented below in the visit note. 

## 2016-01-30 NOTE — Assessment & Plan Note (Signed)
Metformin, Pravastatin, ASA, Coreg, Lotensin

## 2016-01-30 NOTE — Progress Notes (Signed)
Subjective:  Patient ID: Harold Pittman, male    DOB: 1940/08/17  Age: 75 y.o. MRN: [id number]  CC: No chief complaint on file.   HPI Harold Pittman presents for HTN, CAD, DM2, gout f/u. C/o LBP started last week - no irradiation; moderate; pt took Tylenol  Outpatient Medications Prior to Visit  Medication Sig Dispense Refill  . albuterol (PROAIR HFA) 108 (90 BASE) MCG/ACT inhaler Inhale 2 puffs into the lungs 2 (two) times daily. 1 Inhaler 11  . amLODipine (NORVASC) 10 MG tablet Take 1 tablet by mouth  daily 90 tablet 2  . aspirin 81 MG EC tablet Take 81 mg by mouth daily.      . beclomethasone (QVAR) 80 MCG/ACT inhaler Inhale 2 puffs into the lungs 2 (two) times daily. 1 Inhaler 11  . benazepril (LOTENSIN) 40 MG tablet 1 tab po qd (Patient taking differently: Take 40 mg by mouth daily. ) 90 tablet 3  . carvedilol (COREG) 25 MG tablet TAKE 1 TABLET BY MOUTH TWO  TIMES DAILY WITH MEALS 180 tablet 2  . Cholecalciferol 1000 UNITS tablet Take 2,000 Units by mouth daily.      . Cyanocobalamin (VITAMIN B-12) 1000 MCG SUBL Place 1 tablet (1,000 mcg total) under the tongue daily. 100 tablet 3  . docusate sodium (COLACE) 100 MG capsule Take 1 capsule (100 mg total) by mouth 2 (two) times daily. 10 capsule 0  . ferrous sulfate 325 (65 FE) MG tablet Take 1 tablet (325 mg total) by mouth daily. 30 tablet 6  . furosemide (LASIX) 20 MG tablet TAKE 1 TABLET BY MOUTH  EVERY MORNING 90 tablet 2  . hydrALAZINE (APRESOLINE) 50 MG tablet TAKE 1 TABLET BY MOUTH 3  TIMES DAILY 270 tablet 2  . indomethacin (INDOCIN) 25 MG capsule TAKE TWO CAPSULES BY MOUTH THREE TIMES DAILY AS NEEDED FOR  GOUT  ATTACKS 90 capsule 3  . loperamide (IMODIUM) 2 MG capsule Take 1 capsule by mouth 4  times daily as needed for  diarrhea or loose stools 180 capsule 3  . meloxicam (MOBIC) 15 MG tablet Take 1 tablet (15 mg total) by mouth daily. 30 tablet 0  . metFORMIN (GLUCOPHAGE) 500 MG tablet TAKE 1 TABLET BY MOUTH  TWICE A DAY 180  tablet 2  . omeprazole (PRILOSEC) 20 MG capsule Take 1 capsule by mouth  daily 90 capsule 2  . pravastatin (PRAVACHOL) 80 MG tablet TAKE 1 TABLET BY MOUTH AT  BEDTIME 90 tablet 2  . promethazine (PHENERGAN) 25 MG tablet Take 1 tablet (25 mg total) by mouth every 8 (eight) hours as needed for nausea or vomiting. 30 tablet 0  . ranitidine (ZANTAC) 300 MG tablet TAKE 1 TABLET BY MOUTH  EVERY EVENING 90 tablet 2  . tadalafil (CIALIS) 20 MG tablet Take as directed 10 tablet 5  . zolpidem (AMBIEN) 5 MG tablet Take 1 tablet (5 mg total) by mouth at bedtime as needed for sleep. 30 tablet 5  . cefUROXime (CEFTIN) 250 MG tablet Take 1 tablet by mouth 2 (two) times daily.  0   No facility-administered medications prior to visit.     ROS Review of Systems  Constitutional: Negative for appetite change, fatigue and unexpected weight change.  HENT: Negative for congestion, nosebleeds, sneezing, sore throat and trouble swallowing.   Eyes: Negative for itching and visual disturbance.  Respiratory: Negative for cough.   Cardiovascular: Negative for chest pain, palpitations and leg swelling.  Gastrointestinal: Negative for abdominal distention,  blood in stool, diarrhea and nausea.  Genitourinary: Negative for frequency and hematuria.  Musculoskeletal: Positive for back pain. Negative for gait problem, joint swelling and neck pain.  Skin: Negative for rash.  Neurological: Negative for dizziness, tremors, speech difficulty and weakness.  Psychiatric/Behavioral: Negative for agitation, dysphoric mood and sleep disturbance. The patient is not nervous/anxious.     Objective:  BP (!) 144/80   Pulse 69   Temp 98.9 F (37.2 C) (Oral)   Wt 190 lb (86.2 kg)   SpO2 96%   BMI 25.07 kg/m   BP Readings from Last 3 Encounters:  01/30/16 (!) 144/80  11/02/15 130/70  09/28/15 138/78    Wt Readings from Last 3 Encounters:  01/30/16 190 lb (86.2 kg)  11/02/15 192 lb (87.1 kg)  09/28/15 194 lb (88 kg)     Physical Exam  Constitutional: He is oriented to person, place, and time. He appears well-developed. No distress.  NAD  HENT:  Mouth/Throat: Oropharynx is clear and moist.  Eyes: Conjunctivae are normal. Pupils are equal, round, and reactive to light.  Neck: Normal range of motion. No JVD present. No thyromegaly present.  Cardiovascular: Normal rate, regular rhythm, normal heart sounds and intact distal pulses.  Exam reveals no gallop and no friction rub.   No murmur heard. Pulmonary/Chest: Effort normal and breath sounds normal. No respiratory distress. He has no wheezes. He has no rales. He exhibits no tenderness.  Abdominal: Soft. Bowel sounds are normal. He exhibits no distension and no mass. There is no tenderness. There is no rebound and no guarding.  Musculoskeletal: Normal range of motion. He exhibits tenderness. He exhibits no edema.  Lymphadenopathy:    He has no cervical adenopathy.  Neurological: He is alert and oriented to person, place, and time. He has normal reflexes. No cranial nerve deficit. He exhibits normal muscle tone. He displays a negative Romberg sign. Coordination and gait normal.  Skin: Skin is warm and dry. No rash noted.  Psychiatric: He has a normal mood and affect. His behavior is normal. Judgment and thought content normal.  LS is tender  Lab Results  Component Value Date   WBC 7.6 08/13/2015   HGB 9.7 (L) 08/13/2015   HCT 32.6 (L) 08/13/2015   PLT 269 08/13/2015   GLUCOSE 149 (H) 08/13/2015   CHOL 103 07/23/2015   TRIG 57 07/23/2015   HDL 31 (L) 07/23/2015   LDLCALC 61 07/23/2015   ALT 12 (L) 07/23/2015   AST 15 07/23/2015   NA 141 08/13/2015   K 3.8 08/13/2015   CL 101 08/13/2015   CREATININE 1.20 08/13/2015   BUN 12 08/13/2015   CO2 28 08/13/2015   TSH 0.91 10/08/2011   PSA 1.31 03/22/2010   INR 1.35 07/23/2015   HGBA1C 6.9 (H) 07/22/2015   MICROALBUR 38.7 (H) 11/24/2013    No results found.  Assessment & Plan:   There are no  diagnoses linked to this encounter. I have discontinued Mr. Lucchesi cefUROXime. I am also having him maintain his aspirin, Cholecalciferol, benazepril, albuterol, beclomethasone, tadalafil, indomethacin, zolpidem, promethazine, meloxicam, loperamide, docusate sodium, ferrous sulfate, Vitamin B-12, omeprazole, amLODipine, furosemide, ranitidine, pravastatin, carvedilol, hydrALAZINE, and metFORMIN.  No orders of the defined types were placed in this encounter.    Follow-up: No Follow-up on file.  Walker Kehr, MD

## 2016-01-31 ENCOUNTER — Other Ambulatory Visit: Payer: Self-pay | Admitting: Internal Medicine

## 2016-01-31 MED ORDER — FERROUS SULFATE 325 (65 FE) MG PO TABS: 325.0000 mg | ORAL_TABLET | Freq: Every day | ORAL | 3 refills | Status: DC

## 2016-01-31 MED ORDER — FERROUS SULFATE 325 (65 FE) MG PO TABS: 325.0000 mg | ORAL_TABLET | Freq: Every day | ORAL | 6 refills | Status: DC

## 2016-02-01 ENCOUNTER — Telehealth: Payer: Self-pay | Admitting: Internal Medicine

## 2016-02-01 NOTE — Telephone Encounter (Signed)
Please follow up with on labs.  Patient called back.  I gave md response on labs but patient has more questions.

## 2016-02-02 NOTE — Telephone Encounter (Signed)
I called pt back- no answer. I left a detailed mess informing him labs are stable, except worsening anemia and to take the iron rx that PCP sent to pharmacy. See meds.

## 2016-02-02 NOTE — Telephone Encounter (Signed)
Pt request to speak to the assistant concern about lab work. Please call him back

## 2016-02-02 NOTE — Telephone Encounter (Signed)
Notes Recorded by Cresenciano Lick, CMA on 02/01/2016 at 11:47 AM EDT Left mess for patient to call back. ------  Notes Recorded by Cassandria Anger, MD on 01/31/2016 at 8:40 PM EDT Erline Levine,  Please inform patient that all labs are stable except for anemia - it is worse. Please take iron (Rx emailed) Thx

## 2016-02-09 ENCOUNTER — Ambulatory Visit (INDEPENDENT_AMBULATORY_CARE_PROVIDER_SITE_OTHER): Payer: Medicare Other | Admitting: Nurse Practitioner

## 2016-02-09 ENCOUNTER — Encounter: Payer: Self-pay | Admitting: Nurse Practitioner

## 2016-02-09 VITALS — BP 167/92 | HR 88 | Ht 73.0 in | Wt 189.0 lb

## 2016-02-09 DIAGNOSIS — M5417 Radiculopathy, lumbosacral region: Secondary | ICD-10-CM

## 2016-02-09 DIAGNOSIS — M5416 Radiculopathy, lumbar region: Secondary | ICD-10-CM

## 2016-02-09 MED ORDER — CYCLOBENZAPRINE HCL 10 MG PO TABS: 10.0000 mg | ORAL_TABLET | Freq: Every evening | ORAL | 0 refills | Status: DC | PRN

## 2016-02-09 MED ORDER — METHYLPREDNISOLONE ACETATE 40 MG/ML IJ SUSP
40.0000 mg | Freq: Once | INTRAMUSCULAR | Status: AC
  Administered 2016-02-09: 40 mg via INTRAMUSCULAR

## 2016-02-09 MED ORDER — METHYLPREDNISOLONE 4 MG PO TBPK: ORAL_TABLET | ORAL | 0 refills | Status: DC

## 2016-02-09 NOTE — Progress Notes (Signed)
Subjective:  Patient ID: Harold Pittman, male    DOB: [birth year]  Age: 75 y.o. MRN: [id number]  CC: Leg Pain (Pt stated Lt back of the leg having burning sensation/pain)   Leg Pain   The incident occurred 5 to 7 days ago. There was no injury mechanism. The pain is present in the right thigh (buttocks to right posterior thigh). The quality of the pain is described as cramping and burning. The pain is moderate. The pain has been fluctuating since onset. Pertinent negatives include no inability to bear weight, loss of motion, loss of sensation, muscle weakness, numbness or tingling. The symptoms are aggravated by weight bearing (laying supine or on right side). He has tried heat and acetaminophen (codeine and meloxicam) for the symptoms. The treatment provided mild relief.  no change in GI or GU function.  Outpatient Medications Prior to Visit  Medication Sig Dispense Refill  . Acetaminophen-Codeine (TYLENOL/CODEINE #3) 300-30 MG tablet Take 0.5-1 tablets by mouth every 6 (six) hours as needed for pain. 60 tablet 1  . albuterol (PROAIR HFA) 108 (90 BASE) MCG/ACT inhaler Inhale 2 puffs into the lungs 2 (two) times daily. 1 Inhaler 11  . amLODipine (NORVASC) 10 MG tablet Take 1 tablet by mouth  daily 90 tablet 2  . aspirin 81 MG EC tablet Take 81 mg by mouth daily.      . beclomethasone (QVAR) 80 MCG/ACT inhaler Inhale 2 puffs into the lungs 2 (two) times daily. 1 Inhaler 11  . benazepril (LOTENSIN) 40 MG tablet 1 tab po qd (Patient taking differently: Take 40 mg by mouth daily. ) 90 tablet 3  . carvedilol (COREG) 25 MG tablet TAKE 1 TABLET BY MOUTH TWO  TIMES DAILY WITH MEALS 180 tablet 2  . Cholecalciferol 1000 UNITS tablet Take 2,000 Units by mouth daily.      . Cyanocobalamin (VITAMIN B-12) 1000 MCG SUBL Place 1 tablet (1,000 mcg total) under the tongue daily. 100 tablet 3  . docusate sodium (COLACE) 100 MG capsule Take 1 capsule (100 mg total) by mouth 2 (two) times daily. 10 capsule 0  .  ferrous sulfate 325 (65 FE) MG tablet Take 1 tablet (325 mg total) by mouth daily. 90 tablet 3  . furosemide (LASIX) 20 MG tablet TAKE 1 TABLET BY MOUTH  EVERY MORNING 90 tablet 2  . hydrALAZINE (APRESOLINE) 50 MG tablet TAKE 1 TABLET BY MOUTH 3  TIMES DAILY 270 tablet 2  . loperamide (IMODIUM) 2 MG capsule Take 1 capsule by mouth 4  times daily as needed for  diarrhea or loose stools 180 capsule 3  . meloxicam (MOBIC) 15 MG tablet Take 1 tablet (15 mg total) by mouth daily. 30 tablet 0  . metFORMIN (GLUCOPHAGE) 500 MG tablet TAKE 1 TABLET BY MOUTH  TWICE A DAY 180 tablet 2  . omeprazole (PRILOSEC) 20 MG capsule Take 1 capsule by mouth  daily 90 capsule 2  . pravastatin (PRAVACHOL) 80 MG tablet TAKE 1 TABLET BY MOUTH AT  BEDTIME 90 tablet 2  . promethazine (PHENERGAN) 25 MG tablet Take 1 tablet (25 mg total) by mouth every 8 (eight) hours as needed for nausea or vomiting. 30 tablet 0  . ranitidine (ZANTAC) 300 MG tablet TAKE 1 TABLET BY MOUTH  EVERY EVENING 90 tablet 2  . tadalafil (CIALIS) 20 MG tablet Take as directed 10 tablet 5  . zolpidem (AMBIEN) 5 MG tablet Take 1 tablet (5 mg total) by mouth at bedtime as needed for  sleep. 30 tablet 5  . indomethacin (INDOCIN) 25 MG capsule TAKE TWO CAPSULES BY MOUTH THREE TIMES DAILY AS NEEDED FOR  GOUT  ATTACKS 90 capsule 3   No facility-administered medications prior to visit.     ROS See HPI  Objective:  BP (!) 167/92 (BP Location: Left Arm, Patient Position: Sitting, Cuff Size: Normal)   Pulse 88   Ht 6\' 1"  (1.854 m)   Wt 189 lb (85.7 kg)   BMI 24.94 kg/m   BP Readings from Last 3 Encounters:  02/09/16 (!) 167/92  01/30/16 (!) 144/80  11/02/15 130/70    Wt Readings from Last 3 Encounters:  02/09/16 189 lb (85.7 kg)  01/30/16 190 lb (86.2 kg)  11/02/15 192 lb (87.1 kg)    Physical Exam  Constitutional: He is oriented to person, place, and time. No distress.  Cardiovascular: Normal rate.   Pulmonary/Chest: Effort normal.    Abdominal: Soft. Bowel sounds are normal. He exhibits no distension. There is no tenderness.  Musculoskeletal: Normal range of motion. He exhibits no edema or tenderness.       Right hip: Normal.       Right knee: Normal.       Right ankle: Normal.       Lumbar back: Normal.  Tenderness at right posterior thigh with knee flexion. Negative straight leg raise.  Neurological: He is alert and oriented to person, place, and time. He has normal reflexes.  Skin: Skin is warm and dry. No rash noted. No erythema.  Vitals reviewed.   Lab Results  Component Value Date   WBC 7.1 01/30/2016   HGB 9.0 (L) 01/30/2016   HCT 28.3 (L) 01/30/2016   PLT 257.0 01/30/2016   GLUCOSE 143 (H) 01/30/2016   CHOL 103 07/23/2015   TRIG 57 07/23/2015   HDL 31 (L) 07/23/2015   LDLCALC 61 07/23/2015   ALT 12 (L) 07/23/2015   AST 15 07/23/2015   NA 146 (H) 01/30/2016   K 4.3 01/30/2016   CL 110 01/30/2016   CREATININE 1.26 01/30/2016   BUN 18 01/30/2016   CO2 29 01/30/2016   TSH 0.91 10/08/2011   PSA 1.31 03/22/2010   INR 1.35 07/23/2015   HGBA1C 6.3 01/30/2016   MICROALBUR 38.7 (H) 11/24/2013    No results found.  Assessment & Plan:   Braidyn was seen today for leg pain.  Diagnoses and all orders for this visit:  Lumbar back pain with radiculopathy affecting right lower extremity -     Discontinue: methylPREDNISolone (MEDROL DOSEPAK) 4 MG TBPK tablet; Take as directed on package -     Discontinue: cyclobenzaprine (FLEXERIL) 10 MG tablet; Take 1 tablet (10 mg total) by mouth at bedtime as needed for muscle spasms. -     methylPREDNISolone (MEDROL DOSEPAK) 4 MG TBPK tablet; Take as directed on package -     cyclobenzaprine (FLEXERIL) 10 MG tablet; Take 1 tablet (10 mg total) by mouth at bedtime as needed for muscle spasms. -     methylPREDNISolone acetate (DEPO-MEDROL) injection 40 mg; Inject 1 mL (40 mg total) into the muscle once.   I have discontinued Mr. Perotti indomethacin. I am also  having him maintain his aspirin, Cholecalciferol, benazepril, albuterol, beclomethasone, tadalafil, zolpidem, promethazine, meloxicam, loperamide, docusate sodium, Vitamin B-12, omeprazole, amLODipine, furosemide, ranitidine, pravastatin, carvedilol, hydrALAZINE, metFORMIN, Acetaminophen-Codeine, ferrous sulfate, methylPREDNISolone, and cyclobenzaprine. We will continue to administer methylPREDNISolone acetate.  Meds ordered this encounter  Medications  . DISCONTD: methylPREDNISolone (MEDROL DOSEPAK) 4 MG TBPK  tablet    Sig: Take as directed on package    Dispense:  21 tablet    Refill:  0    Order Specific Question:   Supervising Provider    Answer:   Cassandria Anger [[id number]]  . DISCONTD: cyclobenzaprine (FLEXERIL) 10 MG tablet    Sig: Take 1 tablet (10 mg total) by mouth at bedtime as needed for muscle spasms.    Dispense:  14 tablet    Refill:  0    Order Specific Question:   Supervising Provider    Answer:   Cassandria Anger [[id number]]  . methylPREDNISolone (MEDROL DOSEPAK) 4 MG TBPK tablet    Sig: Take as directed on package    Dispense:  21 tablet    Refill:  0    Order Specific Question:   Supervising Provider    Answer:   Cassandria Anger [[id number]]  . cyclobenzaprine (FLEXERIL) 10 MG tablet    Sig: Take 1 tablet (10 mg total) by mouth at bedtime as needed for muscle spasms.    Dispense:  14 tablet    Refill:  0    Order Specific Question:   Supervising Provider    Answer:   Cassandria Anger [[id number]]  . methylPREDNISolone acetate (DEPO-MEDROL) injection 40 mg    Follow-up: Return if symptoms worsen or fail to improve.  Wilfred Lacy, NP

## 2016-02-09 NOTE — Patient Instructions (Signed)
Return to office if no improvement in 1week. Start medrol dose pack tomorrow (take with food) Continue use of warm compress as needed.  Monitor blood sugar at least once a day. Call office if blood sugar is persistently greater than 200 for more than 2days. Maintain low sugar and low carb diet. Encourage adequate oral hydration.

## 2016-02-09 NOTE — Progress Notes (Signed)
Pre visit review using our clinic review tool, if applicable. No additional management support is needed unless otherwise documented below in the visit note. 

## 2016-03-06 DIAGNOSIS — E119 Type 2 diabetes mellitus without complications: Secondary | ICD-10-CM | POA: Diagnosis not present

## 2016-04-20 DIAGNOSIS — M545 Low back pain: Secondary | ICD-10-CM | POA: Diagnosis not present

## 2016-04-20 DIAGNOSIS — M532X7 Spinal instabilities, lumbosacral region: Secondary | ICD-10-CM | POA: Diagnosis not present

## 2016-04-25 DIAGNOSIS — M545 Low back pain: Secondary | ICD-10-CM | POA: Diagnosis not present

## 2016-05-07 ENCOUNTER — Other Ambulatory Visit: Payer: Self-pay | Admitting: *Deleted

## 2016-05-07 DIAGNOSIS — M5416 Radiculopathy, lumbar region: Secondary | ICD-10-CM

## 2016-05-07 MED ORDER — CYCLOBENZAPRINE HCL 10 MG PO TABS: 10.0000 mg | ORAL_TABLET | Freq: Every evening | ORAL | 0 refills | Status: DC | PRN

## 2016-05-17 ENCOUNTER — Telehealth: Payer: Self-pay | Admitting: Emergency Medicine

## 2016-05-17 NOTE — Telephone Encounter (Signed)
[facility] was out doing a home visit and states patient has protein in his urine. She would like you to give her a call back about this. Please advise thanks.

## 2016-05-18 NOTE — Telephone Encounter (Signed)
Pls sch OV Thx 

## 2016-05-18 NOTE — Telephone Encounter (Signed)
Made an appointment on Tuesday.

## 2016-05-18 NOTE — Telephone Encounter (Signed)
Is this something that patient should be worried about

## 2016-05-22 ENCOUNTER — Ambulatory Visit (INDEPENDENT_AMBULATORY_CARE_PROVIDER_SITE_OTHER): Payer: Medicare Other | Admitting: Internal Medicine

## 2016-05-22 ENCOUNTER — Encounter: Payer: Self-pay | Admitting: Internal Medicine

## 2016-05-22 ENCOUNTER — Other Ambulatory Visit (INDEPENDENT_AMBULATORY_CARE_PROVIDER_SITE_OTHER): Payer: Medicare Other

## 2016-05-22 DIAGNOSIS — I10 Essential (primary) hypertension: Secondary | ICD-10-CM

## 2016-05-22 DIAGNOSIS — I25810 Atherosclerosis of coronary artery bypass graft(s) without angina pectoris: Secondary | ICD-10-CM

## 2016-05-22 DIAGNOSIS — E785 Hyperlipidemia, unspecified: Secondary | ICD-10-CM

## 2016-05-22 DIAGNOSIS — E1151 Type 2 diabetes mellitus with diabetic peripheral angiopathy without gangrene: Secondary | ICD-10-CM

## 2016-05-22 DIAGNOSIS — R269 Unspecified abnormalities of gait and mobility: Secondary | ICD-10-CM

## 2016-05-22 DIAGNOSIS — M1000 Idiopathic gout, unspecified site: Secondary | ICD-10-CM | POA: Diagnosis not present

## 2016-05-22 DIAGNOSIS — K219 Gastro-esophageal reflux disease without esophagitis: Secondary | ICD-10-CM

## 2016-05-22 LAB — LIPID PANEL
CHOLESTEROL: 128 mg/dL (ref 0–200)
HDL: 36.7 mg/dL — AB (ref >39.00)
LDL Cholesterol: 66 mg/dL (ref 0–99)
NonHDL: 90.9
Total CHOL/HDL Ratio: 3
Triglycerides: 123 mg/dL (ref 0.0–149.0)
VLDL: 24.6 mg/dL (ref 0.0–40.0)

## 2016-05-22 LAB — BASIC METABOLIC PANEL
BUN: 15 mg/dL (ref 6–23)
CO2: 30 mEq/L (ref 19–32)
Calcium: 9.8 mg/dL (ref 8.4–10.5)
Chloride: 105 mEq/L (ref 96–112)
Creatinine, Ser: 1.1 mg/dL (ref 0.40–1.50)
GFR: 83.9 mL/min (ref >60.00)
Glucose, Bld: 110 mg/dL — ABNORMAL HIGH (ref 70–99)
POTASSIUM: 4.6 meq/L (ref 3.5–5.1)
SODIUM: 142 meq/L (ref 135–145)

## 2016-05-22 LAB — HEMOGLOBIN A1C: Hgb A1c MFr Bld: 6.6 % — ABNORMAL HIGH (ref 4.6–6.5)

## 2016-05-22 NOTE — Progress Notes (Signed)
Pre-visit discussion using our clinic review tool. No additional management support is needed unless otherwise documented below in the visit note.  

## 2016-05-22 NOTE — Progress Notes (Signed)
Subjective:  Patient ID: Harold Pittman, male    DOB: 03/15/1941  Age: 76 y.o. MRN: [id number]  CC: Follow-up (protein in urine, no sx of UTI)   HPI Harold Pittman presents for HTN, GERD, DM f/u  Outpatient Medications Prior to Visit  Medication Sig Dispense Refill  . Acetaminophen-Codeine (TYLENOL/CODEINE #3) 300-30 MG tablet Take 0.5-1 tablets by mouth every 6 (six) hours as needed for pain. 60 tablet 1  . albuterol (PROAIR HFA) 108 (90 BASE) MCG/ACT inhaler Inhale 2 puffs into the lungs 2 (two) times daily. 1 Inhaler 11  . amLODipine (NORVASC) 10 MG tablet Take 1 tablet by mouth  daily 90 tablet 2  . aspirin 81 MG EC tablet Take 81 mg by mouth daily.      . beclomethasone (QVAR) 80 MCG/ACT inhaler Inhale 2 puffs into the lungs 2 (two) times daily. 1 Inhaler 11  . benazepril (LOTENSIN) 40 MG tablet 1 tab po qd (Patient taking differently: Take 40 mg by mouth daily. ) 90 tablet 3  . carvedilol (COREG) 25 MG tablet TAKE 1 TABLET BY MOUTH TWO  TIMES DAILY WITH MEALS 180 tablet 2  . Cholecalciferol 1000 UNITS tablet Take 2,000 Units by mouth daily.      . Cyanocobalamin (VITAMIN B-12) 1000 MCG SUBL Place 1 tablet (1,000 mcg total) under the tongue daily. 100 tablet 3  . cyclobenzaprine (FLEXERIL) 10 MG tablet Take 1 tablet (10 mg total) by mouth at bedtime as needed for muscle spasms. 90 tablet 0  . docusate sodium (COLACE) 100 MG capsule Take 1 capsule (100 mg total) by mouth 2 (two) times daily. 10 capsule 0  . ferrous sulfate 325 (65 FE) MG tablet Take 1 tablet (325 mg total) by mouth daily. 90 tablet 3  . furosemide (LASIX) 20 MG tablet TAKE 1 TABLET BY MOUTH  EVERY MORNING 90 tablet 2  . hydrALAZINE (APRESOLINE) 50 MG tablet TAKE 1 TABLET BY MOUTH 3  TIMES DAILY 270 tablet 2  . loperamide (IMODIUM) 2 MG capsule Take 1 capsule by mouth 4  times daily as needed for  diarrhea or loose stools 180 capsule 3  . metFORMIN (GLUCOPHAGE) 500 MG tablet TAKE 1 TABLET BY MOUTH  TWICE A DAY 180  tablet 2  . methylPREDNISolone (MEDROL DOSEPAK) 4 MG TBPK tablet Take as directed on package 21 tablet 0  . omeprazole (PRILOSEC) 20 MG capsule Take 1 capsule by mouth  daily 90 capsule 2  . pravastatin (PRAVACHOL) 80 MG tablet TAKE 1 TABLET BY MOUTH AT  BEDTIME 90 tablet 2  . promethazine (PHENERGAN) 25 MG tablet Take 1 tablet (25 mg total) by mouth every 8 (eight) hours as needed for nausea or vomiting. 30 tablet 0  . ranitidine (ZANTAC) 300 MG tablet TAKE 1 TABLET BY MOUTH  EVERY EVENING 90 tablet 2  . tadalafil (CIALIS) 20 MG tablet Take as directed 10 tablet 5  . zolpidem (AMBIEN) 5 MG tablet Take 1 tablet (5 mg total) by mouth at bedtime as needed for sleep. 30 tablet 5   No facility-administered medications prior to visit.     ROS Review of Systems  Constitutional: Negative for appetite change, fatigue and unexpected weight change.  HENT: Negative for congestion, nosebleeds, sneezing, sore throat and trouble swallowing.   Eyes: Negative for itching and visual disturbance.  Respiratory: Negative for cough.   Cardiovascular: Negative for chest pain, palpitations and leg swelling.  Gastrointestinal: Negative for abdominal distention, blood in stool, diarrhea and nausea.  Genitourinary: Negative for frequency and hematuria.  Musculoskeletal: Negative for back pain, gait problem, joint swelling and neck pain.  Skin: Negative for rash.  Neurological: Negative for dizziness, tremors, speech difficulty and weakness.  Psychiatric/Behavioral: Negative for agitation, dysphoric mood, sleep disturbance and suicidal ideas. The patient is not nervous/anxious.     Objective:  BP (!) 160/88 (BP Location: Right Arm, Patient Position: Sitting, Cuff Size: Normal)   Pulse 60   Temp 97.7 F (36.5 C) (Oral)   Resp 16   Ht 6\' 1"  (1.854 m)   Wt 190 lb (86.2 kg)   SpO2 98%   BMI 25.07 kg/m   BP Readings from Last 3 Encounters:  05/22/16 (!) 160/88  02/09/16 (!) 167/92  01/30/16 (!) 144/80     Wt Readings from Last 3 Encounters:  05/22/16 190 lb (86.2 kg)  02/09/16 189 lb (85.7 kg)  01/30/16 190 lb (86.2 kg)    Physical Exam  Constitutional: He is oriented to person, place, and time. He appears well-developed. No distress.  NAD  HENT:  Mouth/Throat: Oropharynx is clear and moist.  Eyes: Conjunctivae are normal. Pupils are equal, round, and reactive to light.  Neck: Normal range of motion. No JVD present. No thyromegaly present.  Cardiovascular: Normal rate, regular rhythm, normal heart sounds and intact distal pulses.  Exam reveals no gallop and no friction rub.   No murmur heard. Pulmonary/Chest: Effort normal and breath sounds normal. No respiratory distress. He has no wheezes. He has no rales. He exhibits no tenderness.  Abdominal: Soft. Bowel sounds are normal. He exhibits no distension and no mass. There is no tenderness. There is no rebound and no guarding.  Musculoskeletal: Normal range of motion. He exhibits no edema or tenderness.  Lymphadenopathy:    He has no cervical adenopathy.  Neurological: He is alert and oriented to person, place, and time. He has normal reflexes. No cranial nerve deficit. He exhibits normal muscle tone. He displays a negative Romberg sign. Coordination and gait normal.  Skin: Skin is warm and dry. No rash noted.  Psychiatric: He has a normal mood and affect. His behavior is normal. Judgment and thought content normal.    Lab Results  Component Value Date   WBC 7.1 01/30/2016   HGB 9.0 (L) 01/30/2016   HCT 28.3 (L) 01/30/2016   PLT 257.0 01/30/2016   GLUCOSE 143 (H) 01/30/2016   CHOL 103 07/23/2015   TRIG 57 07/23/2015   HDL 31 (L) 07/23/2015   LDLCALC 61 07/23/2015   ALT 12 (L) 07/23/2015   AST 15 07/23/2015   NA 146 (H) 01/30/2016   K 4.3 01/30/2016   CL 110 01/30/2016   CREATININE 1.26 01/30/2016   BUN 18 01/30/2016   CO2 29 01/30/2016   TSH 0.91 10/08/2011   PSA 1.31 03/22/2010   INR 1.35 07/23/2015   HGBA1C 6.3  01/30/2016   MICROALBUR 38.7 (H) 11/24/2013    No results found.  Assessment & Plan:   There are no diagnoses linked to this encounter. I am having Harold Pittman maintain his aspirin, Cholecalciferol, benazepril, albuterol, beclomethasone, tadalafil, zolpidem, promethazine, loperamide, docusate sodium, Vitamin B-12, omeprazole, amLODipine, furosemide, ranitidine, pravastatin, carvedilol, hydrALAZINE, metFORMIN, Acetaminophen-Codeine, ferrous sulfate, methylPREDNISolone, and cyclobenzaprine.  No orders of the defined types were placed in this encounter.    Follow-up: No Follow-up on file.  Walker Kehr, MD

## 2016-05-22 NOTE — Assessment & Plan Note (Signed)
No relapse 

## 2016-05-22 NOTE — Assessment & Plan Note (Signed)
ASA, Pravachol, Norvasc and Coreg

## 2016-05-22 NOTE — Assessment & Plan Note (Signed)
Lotensin, Coreg, Hydralazine BP re-checked - nl

## 2016-05-22 NOTE — Assessment & Plan Note (Signed)
Hold Zantac - see if ok on Omeprazole only

## 2016-05-22 NOTE — Assessment & Plan Note (Signed)
Pravachol

## 2016-06-01 ENCOUNTER — Ambulatory Visit: Payer: Medicare Other | Admitting: Internal Medicine

## 2016-06-12 ENCOUNTER — Ambulatory Visit (INDEPENDENT_AMBULATORY_CARE_PROVIDER_SITE_OTHER): Payer: Medicare Other | Admitting: Nurse Practitioner

## 2016-06-12 ENCOUNTER — Encounter: Payer: Self-pay | Admitting: Nurse Practitioner

## 2016-06-12 VITALS — BP 146/64 | HR 75 | Temp 98.1°F | Ht 73.0 in | Wt 193.0 lb

## 2016-06-12 DIAGNOSIS — J069 Acute upper respiratory infection, unspecified: Secondary | ICD-10-CM

## 2016-06-12 MED ORDER — AZITHROMYCIN 250 MG PO TABS: 250.0000 mg | ORAL_TABLET | Freq: Every day | ORAL | 0 refills | Status: DC

## 2016-06-12 MED ORDER — GUAIFENESIN ER 600 MG PO TB12: 600.0000 mg | ORAL_TABLET | Freq: Two times a day (BID) | ORAL | 0 refills | Status: DC | PRN

## 2016-06-12 MED ORDER — IPRATROPIUM BROMIDE 0.03 % NA SOLN: 2.0000 | Freq: Two times a day (BID) | NASAL | 0 refills | Status: DC

## 2016-06-12 MED ORDER — PROMETHAZINE-DM 6.25-15 MG/5ML PO SYRP: 5.0000 mL | ORAL_SOLUTION | Freq: Three times a day (TID) | ORAL | 0 refills | Status: DC | PRN

## 2016-06-12 NOTE — Patient Instructions (Signed)
Start azithromycin if no improvement in 2days.  Encourage adequate oral hydration.

## 2016-06-12 NOTE — Progress Notes (Signed)
Subjective:  Patient ID: Harold Pittman, male    DOB: 1941-01-28  Age: 76 y.o. MRN: [id number]  CC: Cough (coughing yellow mucus for 1 wk. took OTC)   Cough  This is a new problem. The current episode started in the past 7 days. The problem has been gradually worsening. The problem occurs constantly. The cough is productive of purulent sputum. Associated symptoms include headaches, nasal congestion, postnasal drip, rhinorrhea and shortness of breath. Pertinent negatives include no chest pain, chills, ear congestion, ear pain, fever, heartburn, hemoptysis, myalgias, rash, sore throat, sweats, weight loss or wheezing. The symptoms are aggravated by lying down. He has tried OTC cough suppressant for the symptoms. The treatment provided no relief. His past medical history is significant for bronchitis and COPD.    Outpatient Medications Prior to Visit  Medication Sig Dispense Refill  . Acetaminophen-Codeine (TYLENOL/CODEINE #3) 300-30 MG tablet Take 0.5-1 tablets by mouth every 6 (six) hours as needed for pain. 60 tablet 1  . albuterol (PROAIR HFA) 108 (90 BASE) MCG/ACT inhaler Inhale 2 puffs into the lungs 2 (two) times daily. 1 Inhaler 11  . amLODipine (NORVASC) 10 MG tablet Take 1 tablet by mouth  daily 90 tablet 2  . aspirin 81 MG EC tablet Take 81 mg by mouth daily.      . beclomethasone (QVAR) 80 MCG/ACT inhaler Inhale 2 puffs into the lungs 2 (two) times daily. 1 Inhaler 11  . benazepril (LOTENSIN) 40 MG tablet 1 tab po qd (Patient taking differently: Take 40 mg by mouth daily. ) 90 tablet 3  . carvedilol (COREG) 25 MG tablet TAKE 1 TABLET BY MOUTH TWO  TIMES DAILY WITH MEALS 180 tablet 2  . Cholecalciferol 1000 UNITS tablet Take 2,000 Units by mouth daily.      . Cyanocobalamin (VITAMIN B-12) 1000 MCG SUBL Place 1 tablet (1,000 mcg total) under the tongue daily. 100 tablet 3  . docusate sodium (COLACE) 100 MG capsule Take 1 capsule (100 mg total) by mouth 2 (two) times daily. 10 capsule 0    . ferrous sulfate 325 (65 FE) MG tablet Take 1 tablet (325 mg total) by mouth daily. 90 tablet 3  . furosemide (LASIX) 20 MG tablet TAKE 1 TABLET BY MOUTH  EVERY MORNING 90 tablet 2  . hydrALAZINE (APRESOLINE) 50 MG tablet TAKE 1 TABLET BY MOUTH 3  TIMES DAILY 270 tablet 2  . loperamide (IMODIUM) 2 MG capsule Take 1 capsule by mouth 4  times daily as needed for  diarrhea or loose stools 180 capsule 3  . metFORMIN (GLUCOPHAGE) 500 MG tablet TAKE 1 TABLET BY MOUTH  TWICE A DAY 180 tablet 2  . omeprazole (PRILOSEC) 20 MG capsule Take 1 capsule by mouth  daily 90 capsule 2  . pravastatin (PRAVACHOL) 80 MG tablet TAKE 1 TABLET BY MOUTH AT  BEDTIME 90 tablet 2  . ranitidine (ZANTAC) 300 MG tablet TAKE 1 TABLET BY MOUTH  EVERY EVENING 90 tablet 2  . tadalafil (CIALIS) 20 MG tablet Take as directed 10 tablet 5  . zolpidem (AMBIEN) 5 MG tablet Take 1 tablet (5 mg total) by mouth at bedtime as needed for sleep. 30 tablet 5  . cyclobenzaprine (FLEXERIL) 10 MG tablet Take 1 tablet (10 mg total) by mouth at bedtime as needed for muscle spasms. 90 tablet 0  . methylPREDNISolone (MEDROL DOSEPAK) 4 MG TBPK tablet Take as directed on package 21 tablet 0  . promethazine (PHENERGAN) 25 MG tablet Take 1 tablet (  25 mg total) by mouth every 8 (eight) hours as needed for nausea or vomiting. 30 tablet 0   No facility-administered medications prior to visit.     ROS See HPI  Objective:  BP (!) 146/64   Pulse 75   Temp 98.1 F (36.7 C)   Ht 6\' 1"  (1.854 m)   Wt 193 lb (87.5 kg)   SpO2 96%   BMI 25.46 kg/m   BP Readings from Last 3 Encounters:  06/12/16 (!) 146/64  05/22/16 124/75  02/09/16 (!) 167/92    Wt Readings from Last 3 Encounters:  06/12/16 193 lb (87.5 kg)  05/22/16 190 lb (86.2 kg)  02/09/16 189 lb (85.7 kg)    Physical Exam  Constitutional: He is oriented to person, place, and time. No distress.  HENT:  Right Ear: Tympanic membrane, external ear and ear canal normal.  Left Ear:  Tympanic membrane and ear canal normal.  Nose: Mucosal edema and rhinorrhea present. Right sinus exhibits maxillary sinus tenderness. Right sinus exhibits no frontal sinus tenderness. Left sinus exhibits maxillary sinus tenderness. Left sinus exhibits no frontal sinus tenderness.  Mouth/Throat: Uvula is midline. Posterior oropharyngeal erythema present. No oropharyngeal exudate.  Eyes: No scleral icterus.  Neck: Normal range of motion. Neck supple.  Cardiovascular: Normal rate and regular rhythm.   Pulmonary/Chest: Effort normal and breath sounds normal. No respiratory distress. He has no wheezes. He has no rales. He exhibits no tenderness.  Lymphadenopathy:    He has no cervical adenopathy.  Neurological: He is alert and oriented to person, place, and time.  Vitals reviewed.   Lab Results  Component Value Date   WBC 7.1 01/30/2016   HGB 9.0 (L) 01/30/2016   HCT 28.3 (L) 01/30/2016   PLT 257.0 01/30/2016   GLUCOSE 110 (H) 05/22/2016   CHOL 128 05/22/2016   TRIG 123.0 05/22/2016   HDL 36.70 (L) 05/22/2016   LDLCALC 66 05/22/2016   ALT 12 (L) 07/23/2015   AST 15 07/23/2015   NA 142 05/22/2016   K 4.6 05/22/2016   CL 105 05/22/2016   CREATININE 1.10 05/22/2016   BUN 15 05/22/2016   CO2 30 05/22/2016   TSH 0.91 10/08/2011   PSA 1.31 03/22/2010   INR 1.35 07/23/2015   HGBA1C 6.6 (H) 05/22/2016   MICROALBUR 38.7 (H) 11/24/2013    No results found.  Assessment & Plan:   Harold Pittman was seen today for cough.  Diagnoses and all orders for this visit:  Acute URI -     promethazine-dextromethorphan (PROMETHAZINE-DM) 6.25-15 MG/5ML syrup; Take 5 mLs by mouth 3 (three) times daily as needed for cough. -     guaiFENesin (MUCINEX) 600 MG 12 hr tablet; Take 1 tablet (600 mg total) by mouth 2 (two) times daily as needed for cough or to loosen phlegm. -     azithromycin (ZITHROMAX Z-PAK) 250 MG tablet; Take 1 tablet (250 mg total) by mouth daily. Take 2tabs on first day, then 1tab once a  day till complete -     ipratropium (ATROVENT) 0.03 % nasal spray; Place 2 sprays into both nostrils 2 (two) times daily. Do not use for more than 5days.   I have discontinued Mr. Harold Pittman promethazine, methylPREDNISolone, and cyclobenzaprine. I am also having him start on promethazine-dextromethorphan, guaiFENesin, azithromycin, and ipratropium. Additionally, I am having him maintain his aspirin, Cholecalciferol, benazepril, albuterol, beclomethasone, tadalafil, zolpidem, loperamide, docusate sodium, Vitamin B-12, omeprazole, amLODipine, furosemide, ranitidine, pravastatin, carvedilol, hydrALAZINE, metFORMIN, Acetaminophen-Codeine, and ferrous sulfate.  Meds ordered  this encounter  Medications  . promethazine-dextromethorphan (PROMETHAZINE-DM) 6.25-15 MG/5ML syrup    Sig: Take 5 mLs by mouth 3 (three) times daily as needed for cough.    Dispense:  240 mL    Refill:  0    Order Specific Question:   Supervising Provider    Answer:   Cassandria Anger [[id number]]  . guaiFENesin (MUCINEX) 600 MG 12 hr tablet    Sig: Take 1 tablet (600 mg total) by mouth 2 (two) times daily as needed for cough or to loosen phlegm.    Dispense:  14 tablet    Refill:  0    Order Specific Question:   Supervising Provider    Answer:   Cassandria Anger [[id number]]  . azithromycin (ZITHROMAX Z-PAK) 250 MG tablet    Sig: Take 1 tablet (250 mg total) by mouth daily. Take 2tabs on first day, then 1tab once a day till complete    Dispense:  6 tablet    Refill:  0    Order Specific Question:   Supervising Provider    Answer:   Cassandria Anger [[id number]]  . ipratropium (ATROVENT) 0.03 % nasal spray    Sig: Place 2 sprays into both nostrils 2 (two) times daily. Do not use for more than 5days.    Dispense:  30 mL    Refill:  0    Order Specific Question:   Supervising Provider    Answer:   Cassandria Anger [[id number]]    Follow-up: Return if symptoms worsen or fail to improve.  Wilfred Lacy, NP

## 2016-06-12 NOTE — Progress Notes (Signed)
Pre visit review using our clinic review tool, if applicable. No additional management support is needed unless otherwise documented below in the visit note. 

## 2016-09-19 ENCOUNTER — Ambulatory Visit (INDEPENDENT_AMBULATORY_CARE_PROVIDER_SITE_OTHER): Payer: Medicare Other | Admitting: Internal Medicine

## 2016-09-19 ENCOUNTER — Encounter: Payer: Self-pay | Admitting: Internal Medicine

## 2016-09-19 ENCOUNTER — Other Ambulatory Visit (INDEPENDENT_AMBULATORY_CARE_PROVIDER_SITE_OTHER): Payer: Medicare Other

## 2016-09-19 DIAGNOSIS — I10 Essential (primary) hypertension: Secondary | ICD-10-CM

## 2016-09-19 DIAGNOSIS — E785 Hyperlipidemia, unspecified: Secondary | ICD-10-CM | POA: Diagnosis not present

## 2016-09-19 DIAGNOSIS — E1151 Type 2 diabetes mellitus with diabetic peripheral angiopathy without gangrene: Secondary | ICD-10-CM

## 2016-09-19 DIAGNOSIS — R269 Unspecified abnormalities of gait and mobility: Secondary | ICD-10-CM

## 2016-09-19 DIAGNOSIS — M1000 Idiopathic gout, unspecified site: Secondary | ICD-10-CM | POA: Diagnosis not present

## 2016-09-19 DIAGNOSIS — I25810 Atherosclerosis of coronary artery bypass graft(s) without angina pectoris: Secondary | ICD-10-CM | POA: Diagnosis not present

## 2016-09-19 LAB — BASIC METABOLIC PANEL
BUN: 16 mg/dL (ref 6–23)
CHLORIDE: 107 meq/L (ref 96–112)
CO2: 28 mEq/L (ref 19–32)
Calcium: 9.1 mg/dL (ref 8.4–10.5)
Creatinine, Ser: 1.08 mg/dL (ref 0.40–1.50)
GFR: 85.62 mL/min (ref >60.00)
GLUCOSE: 161 mg/dL — AB (ref 70–99)
POTASSIUM: 3.9 meq/L (ref 3.5–5.1)
Sodium: 143 mEq/L (ref 135–145)

## 2016-09-19 LAB — LIPID PANEL
CHOLESTEROL: 112 mg/dL (ref 0–200)
HDL: 32.5 mg/dL — AB (ref >39.00)
LDL CALC: 65 mg/dL (ref 0–99)
NONHDL: 79.89
Total CHOL/HDL Ratio: 3
Triglycerides: 72 mg/dL (ref 0.0–149.0)
VLDL: 14.4 mg/dL (ref 0.0–40.0)

## 2016-09-19 LAB — HEMOGLOBIN A1C: Hgb A1c MFr Bld: 6.4 % (ref 4.6–6.5)

## 2016-09-19 MED ORDER — ZOSTER VAC RECOMB ADJUVANTED 50 MCG/0.5ML IM SUSR: 0.5000 mL | Freq: Once | INTRAMUSCULAR | 1 refills | Status: AC

## 2016-09-19 NOTE — Assessment & Plan Note (Signed)
Rare attacks lately

## 2016-09-19 NOTE — Addendum Note (Signed)
Addended by: Cassandria Anger on: 09/19/2016 09:07 AM   Modules accepted: Orders

## 2016-09-19 NOTE — Assessment & Plan Note (Signed)
Lotensin, Coreg, Hydralazine, Lasix [facility]

## 2016-09-19 NOTE — Progress Notes (Signed)
Subjective:  Patient ID: Harold Pittman, male    DOB: July 21, 1940  Age: 76 y.o. MRN: [id number]  CC: No chief complaint on file.   HPI Harold Pittman presents for DM, CAD, HTN f/u  Outpatient Medications Prior to Visit  Medication Sig Dispense Refill  . Acetaminophen-Codeine (TYLENOL/CODEINE #3) 300-30 MG tablet Take 0.5-1 tablets by mouth every 6 (six) hours as needed for pain. 60 tablet 1  . albuterol (PROAIR HFA) 108 (90 BASE) MCG/ACT inhaler Inhale 2 puffs into the lungs 2 (two) times daily. 1 Inhaler 11  . amLODipine (NORVASC) 10 MG tablet Take 1 tablet by mouth  daily 90 tablet 2  . aspirin 81 MG EC tablet Take 81 mg by mouth daily.      Marland Kitchen azithromycin (ZITHROMAX Z-PAK) 250 MG tablet Take 1 tablet (250 mg total) by mouth daily. Take 2tabs on first day, then 1tab once a day till complete 6 tablet 0  . beclomethasone (QVAR) 80 MCG/ACT inhaler Inhale 2 puffs into the lungs 2 (two) times daily. 1 Inhaler 11  . benazepril (LOTENSIN) 40 MG tablet 1 tab po qd (Patient taking differently: Take 40 mg by mouth daily. ) 90 tablet 3  . carvedilol (COREG) 25 MG tablet TAKE 1 TABLET BY MOUTH TWO  TIMES DAILY WITH MEALS 180 tablet 2  . Cholecalciferol 1000 UNITS tablet Take 2,000 Units by mouth daily.      . Cyanocobalamin (VITAMIN B-12) 1000 MCG SUBL Place 1 tablet (1,000 mcg total) under the tongue daily. 100 tablet 3  . docusate sodium (COLACE) 100 MG capsule Take 1 capsule (100 mg total) by mouth 2 (two) times daily. 10 capsule 0  . ferrous sulfate 325 (65 FE) MG tablet Take 1 tablet (325 mg total) by mouth daily. 90 tablet 3  . furosemide (LASIX) 20 MG tablet TAKE 1 TABLET BY MOUTH  EVERY MORNING 90 tablet 2  . guaiFENesin (MUCINEX) 600 MG 12 hr tablet Take 1 tablet (600 mg total) by mouth 2 (two) times daily as needed for cough or to loosen phlegm. 14 tablet 0  . hydrALAZINE (APRESOLINE) 50 MG tablet TAKE 1 TABLET BY MOUTH 3  TIMES DAILY 270 tablet 2  . ipratropium (ATROVENT) 0.03 % nasal  spray Place 2 sprays into both nostrils 2 (two) times daily. Do not use for more than 5days. 30 mL 0  . loperamide (IMODIUM) 2 MG capsule Take 1 capsule by mouth 4  times daily as needed for  diarrhea or loose stools 180 capsule 3  . metFORMIN (GLUCOPHAGE) 500 MG tablet TAKE 1 TABLET BY MOUTH  TWICE A DAY 180 tablet 2  . omeprazole (PRILOSEC) 20 MG capsule Take 1 capsule by mouth  daily 90 capsule 2  . pravastatin (PRAVACHOL) 80 MG tablet TAKE 1 TABLET BY MOUTH AT  BEDTIME 90 tablet 2  . promethazine-dextromethorphan (PROMETHAZINE-DM) 6.25-15 MG/5ML syrup Take 5 mLs by mouth 3 (three) times daily as needed for cough. 240 mL 0  . ranitidine (ZANTAC) 300 MG tablet TAKE 1 TABLET BY MOUTH  EVERY EVENING 90 tablet 2  . tadalafil (CIALIS) 20 MG tablet Take as directed 10 tablet 5  . zolpidem (AMBIEN) 5 MG tablet Take 1 tablet (5 mg total) by mouth at bedtime as needed for sleep. 30 tablet 5   No facility-administered medications prior to visit.     ROS Review of Systems  Constitutional: Negative for appetite change, fatigue and unexpected weight change.  HENT: Negative for congestion, nosebleeds, sneezing,  sore throat and trouble swallowing.   Eyes: Negative for itching and visual disturbance.  Respiratory: Negative for cough.   Cardiovascular: Negative for chest pain, palpitations and leg swelling.  Gastrointestinal: Negative for abdominal distention, blood in stool, diarrhea and nausea.  Genitourinary: Negative for frequency and hematuria.  Musculoskeletal: Positive for gait problem. Negative for back pain, joint swelling and neck pain.  Skin: Negative for rash.  Neurological: Negative for dizziness, tremors, speech difficulty and weakness.  Psychiatric/Behavioral: Negative for agitation, dysphoric mood and sleep disturbance. The patient is not nervous/anxious.     Objective:  BP 140/80 (BP Location: Left Arm, Patient Position: Sitting, Cuff Size: Normal)   Pulse 69   Temp 98.1 F (36.7  C) (Oral)   Ht 6\' 1"  (1.854 m)   Wt 188 lb (85.3 kg)   SpO2 98%   BMI 24.80 kg/m   BP Readings from Last 3 Encounters:  09/19/16 140/80  06/12/16 (!) 146/64  05/22/16 124/75    Wt Readings from Last 3 Encounters:  09/19/16 188 lb (85.3 kg)  06/12/16 193 lb (87.5 kg)  05/22/16 190 lb (86.2 kg)    Physical Exam  Constitutional: He is oriented to person, place, and time. He appears well-developed. No distress.  NAD  HENT:  Mouth/Throat: Oropharynx is clear and moist.  Eyes: Conjunctivae are normal. Pupils are equal, round, and reactive to light.  Neck: Normal range of motion. No JVD present. No thyromegaly present.  Cardiovascular: Normal rate, regular rhythm, normal heart sounds and intact distal pulses.  Exam reveals no gallop and no friction rub.   No murmur heard. Pulmonary/Chest: Effort normal and breath sounds normal. No respiratory distress. He has no wheezes. He has no rales. He exhibits no tenderness.  Abdominal: Soft. Bowel sounds are normal. He exhibits no distension and no mass. There is no tenderness. There is no rebound and no guarding.  Musculoskeletal: Normal range of motion. He exhibits no edema or tenderness.  Lymphadenopathy:    He has no cervical adenopathy.  Neurological: He is alert and oriented to person, place, and time. He has normal reflexes. No cranial nerve deficit. He exhibits normal muscle tone. He displays a negative Romberg sign. Coordination and gait normal.  Skin: Skin is warm and dry. No rash noted.  Psychiatric: He has a normal mood and affect. His behavior is normal. Judgment and thought content normal.    Lab Results  Component Value Date   WBC 7.1 01/30/2016   HGB 9.0 (L) 01/30/2016   HCT 28.3 (L) 01/30/2016   PLT 257.0 01/30/2016   GLUCOSE 110 (H) 05/22/2016   CHOL 128 05/22/2016   TRIG 123.0 05/22/2016   HDL 36.70 (L) 05/22/2016   LDLCALC 66 05/22/2016   ALT 12 (L) 07/23/2015   AST 15 07/23/2015   NA 142 05/22/2016   K 4.6  05/22/2016   CL 105 05/22/2016   CREATININE 1.10 05/22/2016   BUN 15 05/22/2016   CO2 30 05/22/2016   TSH 0.91 10/08/2011   PSA 1.31 03/22/2010   INR 1.35 07/23/2015   HGBA1C 6.6 (H) 05/22/2016   MICROALBUR 38.7 (H) 11/24/2013    No results found.  Assessment & Plan:   There are no diagnoses linked to this encounter. I am having Mr. Romanoff maintain his aspirin, Cholecalciferol, benazepril, albuterol, beclomethasone, tadalafil, zolpidem, loperamide, docusate sodium, Vitamin B-12, omeprazole, amLODipine, furosemide, ranitidine, pravastatin, carvedilol, hydrALAZINE, metFORMIN, Acetaminophen-Codeine, ferrous sulfate, promethazine-dextromethorphan, guaiFENesin, azithromycin, and ipratropium.  No orders of the defined types were placed in  this encounter.    Follow-up: No Follow-up on file.  Walker Kehr, MD

## 2016-09-19 NOTE — Assessment & Plan Note (Signed)
ASA, Pravachol, Coreg, Norvasc Rx No angina

## 2016-09-19 NOTE — Assessment & Plan Note (Signed)
Chronic w/PVD, CAD Metformin, Pravastatin, ASA, Coreg, Lotensin

## 2016-09-19 NOTE — Assessment & Plan Note (Signed)
Keep walking daily

## 2016-09-19 NOTE — Assessment & Plan Note (Signed)
labs

## 2016-10-02 DIAGNOSIS — H2513 Age-related nuclear cataract, bilateral: Secondary | ICD-10-CM | POA: Diagnosis not present

## 2016-10-02 DIAGNOSIS — H40013 Open angle with borderline findings, low risk, bilateral: Secondary | ICD-10-CM | POA: Diagnosis not present

## 2016-10-02 DIAGNOSIS — E119 Type 2 diabetes mellitus without complications: Secondary | ICD-10-CM | POA: Diagnosis not present

## 2016-10-02 DIAGNOSIS — H11153 Pinguecula, bilateral: Secondary | ICD-10-CM | POA: Diagnosis not present

## 2016-10-02 DIAGNOSIS — H04123 Dry eye syndrome of bilateral lacrimal glands: Secondary | ICD-10-CM | POA: Diagnosis not present

## 2016-12-10 ENCOUNTER — Other Ambulatory Visit: Payer: Self-pay | Admitting: Internal Medicine

## 2016-12-11 ENCOUNTER — Other Ambulatory Visit: Payer: Self-pay | Admitting: General Practice

## 2016-12-11 MED ORDER — METFORMIN HCL 500 MG PO TABS: 500.0000 mg | ORAL_TABLET | Freq: Two times a day (BID) | ORAL | 1 refills | Status: DC

## 2016-12-11 MED ORDER — HYDRALAZINE HCL 50 MG PO TABS: 50.0000 mg | ORAL_TABLET | Freq: Three times a day (TID) | ORAL | 1 refills | Status: DC

## 2016-12-11 MED ORDER — PRAVASTATIN SODIUM 80 MG PO TABS: 80.0000 mg | ORAL_TABLET | Freq: Every day | ORAL | 1 refills | Status: DC

## 2016-12-11 MED ORDER — OMEPRAZOLE 20 MG PO CPDR: 20.0000 mg | DELAYED_RELEASE_CAPSULE | Freq: Every day | ORAL | 1 refills | Status: DC

## 2016-12-11 MED ORDER — AMLODIPINE BESYLATE 10 MG PO TABS: 10.0000 mg | ORAL_TABLET | Freq: Every day | ORAL | 1 refills | Status: DC

## 2016-12-11 MED ORDER — FUROSEMIDE 20 MG PO TABS: 20.0000 mg | ORAL_TABLET | Freq: Every morning | ORAL | 1 refills | Status: DC

## 2016-12-11 MED ORDER — RANITIDINE HCL 300 MG PO TABS: 300.0000 mg | ORAL_TABLET | Freq: Every evening | ORAL | 1 refills | Status: DC

## 2016-12-11 MED ORDER — CARVEDILOL 25 MG PO TABS: ORAL_TABLET | ORAL | 1 refills | Status: DC

## 2016-12-19 ENCOUNTER — Ambulatory Visit: Payer: Medicare Other | Admitting: Internal Medicine

## 2016-12-31 ENCOUNTER — Encounter: Payer: Self-pay | Admitting: Internal Medicine

## 2016-12-31 ENCOUNTER — Ambulatory Visit (INDEPENDENT_AMBULATORY_CARE_PROVIDER_SITE_OTHER)
Admission: RE | Admit: 2016-12-31 | Discharge: 2016-12-31 | Disposition: A | Discharge status: Home / Self Care | Payer: Medicare Other | Source: Ambulatory Visit | Attending: Internal Medicine | Admitting: Internal Medicine

## 2016-12-31 ENCOUNTER — Ambulatory Visit (INDEPENDENT_AMBULATORY_CARE_PROVIDER_SITE_OTHER): Payer: Medicare Other | Admitting: Internal Medicine

## 2016-12-31 DIAGNOSIS — R05 Cough: Secondary | ICD-10-CM

## 2016-12-31 DIAGNOSIS — M1000 Idiopathic gout, unspecified site: Secondary | ICD-10-CM | POA: Diagnosis not present

## 2016-12-31 DIAGNOSIS — I10 Essential (primary) hypertension: Secondary | ICD-10-CM | POA: Diagnosis not present

## 2016-12-31 DIAGNOSIS — Z23 Encounter for immunization: Secondary | ICD-10-CM

## 2016-12-31 DIAGNOSIS — R059 Cough, unspecified: Secondary | ICD-10-CM

## 2016-12-31 MED ORDER — IRBESARTAN 150 MG PO TABS: 150.0000 mg | ORAL_TABLET | Freq: Every day | ORAL | 11 refills | Status: DC

## 2016-12-31 MED ORDER — METFORMIN HCL ER 750 MG PO TB24: 750.0000 mg | ORAL_TABLET | Freq: Every day | ORAL | 11 refills | Status: DC

## 2016-12-31 NOTE — Assessment & Plan Note (Signed)
Lotensin d/c Start Irbesartan CXR

## 2016-12-31 NOTE — Progress Notes (Signed)
Subjective:  Patient ID: Harold Pittman, male    DOB: [birth year]  Age: 76 y.o. MRN: [id number]  CC: No chief complaint on file.   HPI Harold Pittman presents for COPD, GERD, DM C/o diarrhea  Outpatient Medications Prior to Visit  Medication Sig Dispense Refill  . Acetaminophen-Codeine (TYLENOL/CODEINE #3) 300-30 MG tablet Take 0.5-1 tablets by mouth every 6 (six) hours as needed for pain. 60 tablet 1  . albuterol (PROAIR HFA) 108 (90 BASE) MCG/ACT inhaler Inhale 2 puffs into the lungs 2 (two) times daily. 1 Inhaler 11  . amLODipine (NORVASC) 10 MG tablet Take 1 tablet (10 mg total) by mouth daily. 90 tablet 1  . aspirin 81 MG EC tablet Take 81 mg by mouth daily.      . beclomethasone (QVAR) 80 MCG/ACT inhaler Inhale 2 puffs into the lungs 2 (two) times daily. 1 Inhaler 11  . benazepril (LOTENSIN) 40 MG tablet 1 tab po qd (Patient taking differently: Take 40 mg by mouth daily. ) 90 tablet 3  . carvedilol (COREG) 25 MG tablet TAKE 1 TABLET BY MOUTH TWO  TIMES DAILY WITH MEALS 180 tablet 1  . Cholecalciferol 1000 UNITS tablet Take 2,000 Units by mouth daily.      . Cyanocobalamin (VITAMIN B-12) 1000 MCG SUBL Place 1 tablet (1,000 mcg total) under the tongue daily. 100 tablet 3  . docusate sodium (COLACE) 100 MG capsule Take 1 capsule (100 mg total) by mouth 2 (two) times daily. 10 capsule 0  . ferrous sulfate 325 (65 FE) MG tablet Take 1 tablet (325 mg total) by mouth daily. 90 tablet 3  . furosemide (LASIX) 20 MG tablet Take 1 tablet (20 mg total) by mouth every morning. 90 tablet 1  . guaiFENesin (MUCINEX) 600 MG 12 hr tablet Take 1 tablet (600 mg total) by mouth 2 (two) times daily as needed for cough or to loosen phlegm. 14 tablet 0  . hydrALAZINE (APRESOLINE) 50 MG tablet Take 1 tablet (50 mg total) by mouth 3 (three) times daily. 270 tablet 1  . ipratropium (ATROVENT) 0.03 % nasal spray Place 2 sprays into both nostrils 2 (two) times daily. Do not use for more than 5days. 30 mL 0  .  loperamide (IMODIUM) 2 MG capsule Take 1 capsule by mouth 4  times daily as needed for  diarrhea or loose stools 180 capsule 3  . metFORMIN (GLUCOPHAGE) 500 MG tablet Take 1 tablet (500 mg total) by mouth 2 (two) times daily. 180 tablet 1  . omeprazole (PRILOSEC) 20 MG capsule Take 1 capsule (20 mg total) by mouth daily. 90 capsule 1  . pravastatin (PRAVACHOL) 80 MG tablet Take 1 tablet (80 mg total) by mouth at bedtime. 90 tablet 1  . promethazine-dextromethorphan (PROMETHAZINE-DM) 6.25-15 MG/5ML syrup Take 5 mLs by mouth 3 (three) times daily as needed for cough. 240 mL 0  . ranitidine (ZANTAC) 300 MG tablet Take 1 tablet (300 mg total) by mouth every evening. 90 tablet 1  . tadalafil (CIALIS) 20 MG tablet Take as directed 10 tablet 5  . zolpidem (AMBIEN) 5 MG tablet Take 1 tablet (5 mg total) by mouth at bedtime as needed for sleep. 30 tablet 5   No facility-administered medications prior to visit.     ROS Review of Systems  Constitutional: Negative for appetite change, fatigue and unexpected weight change.  HENT: Negative for congestion, nosebleeds, sneezing, sore throat and trouble swallowing.   Eyes: Negative for itching and visual disturbance.  Respiratory: Positive for cough and shortness of breath.   Cardiovascular: Negative for chest pain, palpitations and leg swelling.  Gastrointestinal: Positive for diarrhea. Negative for abdominal distention, blood in stool and nausea.  Genitourinary: Negative for frequency and hematuria.  Musculoskeletal: Negative for back pain, gait problem, joint swelling and neck pain.  Skin: Negative for rash.  Neurological: Negative for dizziness, tremors, speech difficulty and weakness.  Psychiatric/Behavioral: Negative for agitation, dysphoric mood and sleep disturbance. The patient is not nervous/anxious.     Objective:  BP 138/64 (BP Location: Left Arm, Patient Position: Sitting, Cuff Size: Large)   Pulse 67   Temp 98 F (36.7 C) (Oral)   Ht 6'  1" (1.854 m)   Wt 188 lb (85.3 kg)   SpO2 98%   BMI 24.80 kg/m   BP Readings from Last 3 Encounters:  12/31/16 138/64  09/19/16 140/80  06/12/16 (!) 146/64    Wt Readings from Last 3 Encounters:  12/31/16 188 lb (85.3 kg)  09/19/16 188 lb (85.3 kg)  06/12/16 193 lb (87.5 kg)    Physical Exam  Constitutional: He is oriented to person, place, and time. He appears well-developed. No distress.  NAD  HENT:  Mouth/Throat: Oropharynx is clear and moist.  Eyes: Pupils are equal, round, and reactive to light. Conjunctivae are normal.  Neck: Normal range of motion. No JVD present. No thyromegaly present.  Cardiovascular: Normal rate, regular rhythm, normal heart sounds and intact distal pulses.  Exam reveals no gallop and no friction rub.   No murmur heard. Pulmonary/Chest: Effort normal and breath sounds normal. No respiratory distress. He has no wheezes. He has no rales. He exhibits no tenderness.  Abdominal: Soft. Bowel sounds are normal. He exhibits no distension and no mass. There is no tenderness. There is no rebound and no guarding.  Musculoskeletal: Normal range of motion. He exhibits no edema or tenderness.  Lymphadenopathy:    He has no cervical adenopathy.  Neurological: He is alert and oriented to person, place, and time. He has normal reflexes. No cranial nerve deficit. He exhibits normal muscle tone. He displays a negative Romberg sign. Coordination and gait normal.  Skin: Skin is warm and dry. No rash noted.  Psychiatric: He has a normal mood and affect. His behavior is normal. Judgment and thought content normal.    Lab Results  Component Value Date   WBC 7.1 01/30/2016   HGB 9.0 (L) 01/30/2016   HCT 28.3 (L) 01/30/2016   PLT 257.0 01/30/2016   GLUCOSE 161 (H) 09/19/2016   CHOL 112 09/19/2016   TRIG 72.0 09/19/2016   HDL 32.50 (L) 09/19/2016   LDLCALC 65 09/19/2016   ALT 12 (L) 07/23/2015   AST 15 07/23/2015   NA 143 09/19/2016   K 3.9 09/19/2016   CL 107  09/19/2016   CREATININE 1.08 09/19/2016   BUN 16 09/19/2016   CO2 28 09/19/2016   TSH 0.91 10/08/2011   PSA 1.31 03/22/2010   INR 1.35 07/23/2015   HGBA1C 6.4 09/19/2016   MICROALBUR 38.7 (H) 11/24/2013    No results found.  Assessment & Plan:   There are no diagnoses linked to this encounter. I am having Mr. Freimark maintain his aspirin, Cholecalciferol, benazepril, albuterol, beclomethasone, tadalafil, zolpidem, loperamide, docusate sodium, Vitamin B-12, Acetaminophen-Codeine, ferrous sulfate, promethazine-dextromethorphan, guaiFENesin, ipratropium, hydrALAZINE, ranitidine, omeprazole, amLODipine, metFORMIN, pravastatin, furosemide, and carvedilol.  No orders of the defined types were placed in this encounter.    Follow-up: No Follow-up on file.  Alex Plotnikov,  MD

## 2016-12-31 NOTE — Patient Instructions (Signed)
Balance exercises 

## 2016-12-31 NOTE — Assessment & Plan Note (Signed)
No relapse 

## 2016-12-31 NOTE — Assessment & Plan Note (Signed)
Lotensin d/c, Coreg, Hydralazine, Lasix Rx Start Irbesartan

## 2017-02-11 ENCOUNTER — Ambulatory Visit: Payer: Medicare Other | Admitting: Internal Medicine

## 2017-02-11 ENCOUNTER — Encounter: Payer: Self-pay | Admitting: Internal Medicine

## 2017-02-11 VITALS — BP 142/68 | HR 85 | Temp 98.7°F | Ht 73.0 in | Wt 189.0 lb

## 2017-02-11 DIAGNOSIS — I10 Essential (primary) hypertension: Secondary | ICD-10-CM

## 2017-02-11 DIAGNOSIS — J069 Acute upper respiratory infection, unspecified: Secondary | ICD-10-CM | POA: Diagnosis not present

## 2017-02-11 DIAGNOSIS — E1151 Type 2 diabetes mellitus with diabetic peripheral angiopathy without gangrene: Secondary | ICD-10-CM

## 2017-02-11 DIAGNOSIS — J209 Acute bronchitis, unspecified: Secondary | ICD-10-CM | POA: Diagnosis not present

## 2017-02-11 MED ORDER — PROMETHAZINE-CODEINE 6.25-10 MG/5ML PO SYRP: 5.0000 mL | ORAL_SOLUTION | ORAL | 0 refills | Status: DC | PRN

## 2017-02-11 MED ORDER — AZITHROMYCIN 250 MG PO TABS: ORAL_TABLET | ORAL | 0 refills | Status: DC

## 2017-02-11 NOTE — Patient Instructions (Signed)
You can use over-the-counter  "cold" medicines  such as  "Afrin" nasal spray for nasal congestion as directed. Use " Delsym" or" Robitussin" cough syrup varietis for cough.  You can use plain "Tylenol" or "Advil" for fever, chills and achyness. Use Halls or Ricola cough drops.     Please, make an appointment if you are not better or if you're worse.  

## 2017-02-11 NOTE — Progress Notes (Signed)
Subjective:  Patient ID: Harold Pittman, male    DOB: [birth year]  Age: 76 y.o. MRN: [id number]  CC: No chief complaint on file.   HPI Harold Pittman presents for URI sx's x 1 week; can't sleep F/u HTN, CAD  Outpatient Medications Prior to Visit  Medication Sig Dispense Refill  . Acetaminophen-Codeine (TYLENOL/CODEINE #3) 300-30 MG tablet Take 0.5-1 tablets by mouth every 6 (six) hours as needed for pain. 60 tablet 1  . albuterol (PROAIR HFA) 108 (90 BASE) MCG/ACT inhaler Inhale 2 puffs into the lungs 2 (two) times daily. 1 Inhaler 11  . amLODipine (NORVASC) 10 MG tablet Take 1 tablet (10 mg total) by mouth daily. 90 tablet 1  . aspirin 81 MG EC tablet Take 81 mg by mouth daily.      . beclomethasone (QVAR) 80 MCG/ACT inhaler Inhale 2 puffs into the lungs 2 (two) times daily. 1 Inhaler 11  . carvedilol (COREG) 25 MG tablet TAKE 1 TABLET BY MOUTH TWO  TIMES DAILY WITH MEALS 180 tablet 1  . Cholecalciferol 1000 UNITS tablet Take 2,000 Units by mouth daily.      . Cyanocobalamin (VITAMIN B-12) 1000 MCG SUBL Place 1 tablet (1,000 mcg total) under the tongue daily. 100 tablet 3  . docusate sodium (COLACE) 100 MG capsule Take 1 capsule (100 mg total) by mouth 2 (two) times daily. 10 capsule 0  . furosemide (LASIX) 20 MG tablet Take 1 tablet (20 mg total) by mouth every morning. 90 tablet 1  . guaiFENesin (MUCINEX) 600 MG 12 hr tablet Take 1 tablet (600 mg total) by mouth 2 (two) times daily as needed for cough or to loosen phlegm. 14 tablet 0  . hydrALAZINE (APRESOLINE) 50 MG tablet Take 1 tablet (50 mg total) by mouth 3 (three) times daily. 270 tablet 1  . ipratropium (ATROVENT) 0.03 % nasal spray Place 2 sprays into both nostrils 2 (two) times daily. Do not use for more than 5days. 30 mL 0  . irbesartan (AVAPRO) 150 MG tablet Take 1 tablet (150 mg total) by mouth daily. 30 tablet 11  . loperamide (IMODIUM) 2 MG capsule Take 1 capsule by mouth 4  times daily as needed for  diarrhea or loose  stools 180 capsule 3  . metFORMIN (GLUCOPHAGE XR) 750 MG 24 hr tablet Take 1 tablet (750 mg total) by mouth daily with breakfast. 30 tablet 11  . omeprazole (PRILOSEC) 20 MG capsule Take 1 capsule (20 mg total) by mouth daily. 90 capsule 1  . pravastatin (PRAVACHOL) 80 MG tablet Take 1 tablet (80 mg total) by mouth at bedtime. 90 tablet 1  . promethazine-dextromethorphan (PROMETHAZINE-DM) 6.25-15 MG/5ML syrup Take 5 mLs by mouth 3 (three) times daily as needed for cough. 240 mL 0  . ranitidine (ZANTAC) 300 MG tablet Take 1 tablet (300 mg total) by mouth every evening. 90 tablet 1  . tadalafil (CIALIS) 20 MG tablet Take as directed 10 tablet 5  . zolpidem (AMBIEN) 5 MG tablet Take 1 tablet (5 mg total) by mouth at bedtime as needed for sleep. 30 tablet 5  . ferrous sulfate 325 (65 FE) MG tablet Take 1 tablet (325 mg total) by mouth daily. 90 tablet 3   No facility-administered medications prior to visit.     ROS Review of Systems  Constitutional: Positive for chills. Negative for appetite change, fatigue and unexpected weight change.  HENT: Positive for congestion, postnasal drip, rhinorrhea and sore throat. Negative for nosebleeds, sneezing and trouble  swallowing.   Eyes: Negative for itching and visual disturbance.  Respiratory: Positive for cough.   Cardiovascular: Negative for chest pain, palpitations and leg swelling.  Gastrointestinal: Negative for abdominal distention, blood in stool, diarrhea and nausea.  Genitourinary: Negative for frequency and hematuria.  Musculoskeletal: Negative for back pain, gait problem, joint swelling and neck pain.  Skin: Negative for rash.  Neurological: Negative for dizziness, tremors, speech difficulty and weakness.  Psychiatric/Behavioral: Negative for agitation, dysphoric mood and sleep disturbance. The patient is not nervous/anxious.     Objective:  BP (!) 142/68 (BP Location: Left Arm, Patient Position: Sitting, Cuff Size: Large)   Pulse 85    Temp 98.7 F (37.1 C) (Oral)   Ht 6\' 1"  (1.854 m)   Wt 189 lb (85.7 kg)   SpO2 99%   BMI 24.94 kg/m   BP Readings from Last 3 Encounters:  02/11/17 (!) 142/68  12/31/16 138/64  09/19/16 140/80    Wt Readings from Last 3 Encounters:  02/11/17 189 lb (85.7 kg)  12/31/16 188 lb (85.3 kg)  09/19/16 188 lb (85.3 kg)    Physical Exam  Constitutional: He is oriented to person, place, and time. He appears well-developed. No distress.  NAD  HENT:  Mouth/Throat: Oropharynx is clear and moist.  Eyes: Conjunctivae are normal. Pupils are equal, round, and reactive to light.  Neck: Normal range of motion. No JVD present. No thyromegaly present.  Cardiovascular: Normal rate, regular rhythm, normal heart sounds and intact distal pulses. Exam reveals no gallop and no friction rub.  No murmur heard. Pulmonary/Chest: Effort normal and breath sounds normal. No respiratory distress. He has no wheezes. He has no rales. He exhibits no tenderness.  Abdominal: Soft. Bowel sounds are normal. He exhibits no distension and no mass. There is no tenderness. There is no rebound and no guarding.  Musculoskeletal: Normal range of motion. He exhibits no edema or tenderness.  Lymphadenopathy:    He has no cervical adenopathy.  Neurological: He is alert and oriented to person, place, and time. He has normal reflexes. No cranial nerve deficit. He exhibits normal muscle tone. He displays a negative Romberg sign. Coordination and gait normal.  Skin: Skin is warm and dry. No rash noted.  Psychiatric: He has a normal mood and affect. His behavior is normal. Judgment and thought content normal.  eryth throat  Lab Results  Component Value Date   WBC 7.1 01/30/2016   HGB 9.0 (L) 01/30/2016   HCT 28.3 (L) 01/30/2016   PLT 257.0 01/30/2016   GLUCOSE 161 (H) 09/19/2016   CHOL 112 09/19/2016   TRIG 72.0 09/19/2016   HDL 32.50 (L) 09/19/2016   LDLCALC 65 09/19/2016   ALT 12 (L) 07/23/2015   AST 15 07/23/2015   NA  143 09/19/2016   K 3.9 09/19/2016   CL 107 09/19/2016   CREATININE 1.08 09/19/2016   BUN 16 09/19/2016   CO2 28 09/19/2016   TSH 0.91 10/08/2011   PSA 1.31 03/22/2010   INR 1.35 07/23/2015   HGBA1C 6.4 09/19/2016   MICROALBUR 38.7 (H) 11/24/2013    Dg Chest 2 View  Result Date: 01/01/2017 CLINICAL DATA:  Wheezing, dry cough for 1 week, hypertension, diabetes mellitus, former smoker, prior CABG EXAM: CHEST  2 VIEW COMPARISON:  07/23/2015 FINDINGS: Normal heart size post CABG. Atherosclerotic calcification aorta. Mediastinal contours and pulmonary vascularity normal. Lungs clear. No pulmonary infiltrate, pleural effusion or pneumothorax. Osseous structures unremarkable. IMPRESSION: Post CABG. No acute abnormalities. Electronically Signed  By: Lavonia Dana M.D.   On: 01/01/2017 08:37    Assessment & Plan:   There are no diagnoses linked to this encounter. I am having Marsh Dolly Pies maintain his aspirin, Cholecalciferol, albuterol, beclomethasone, tadalafil, zolpidem, loperamide, docusate sodium, Vitamin B-12, Acetaminophen-Codeine, ferrous sulfate, promethazine-dextromethorphan, guaiFENesin, ipratropium, hydrALAZINE, ranitidine, omeprazole, amLODipine, pravastatin, furosemide, carvedilol, metFORMIN, and irbesartan.  No orders of the defined types were placed in this encounter.    Follow-up: No Follow-up on file.  Walker Kehr, MD

## 2017-02-11 NOTE — Assessment & Plan Note (Signed)
Metformin 

## 2017-02-11 NOTE — Assessment & Plan Note (Signed)
Prom-cod syr Zpac

## 2017-02-11 NOTE — Assessment & Plan Note (Signed)
Irbesartan

## 2017-03-21 DIAGNOSIS — M545 Low back pain: Secondary | ICD-10-CM | POA: Diagnosis not present

## 2017-03-21 DIAGNOSIS — M19032 Primary osteoarthritis, left wrist: Secondary | ICD-10-CM | POA: Diagnosis not present

## 2017-04-03 ENCOUNTER — Ambulatory Visit: Payer: Medicare Other | Admitting: Internal Medicine

## 2017-04-10 ENCOUNTER — Other Ambulatory Visit: Payer: Self-pay | Admitting: Internal Medicine

## 2017-04-10 ENCOUNTER — Ambulatory Visit: Payer: Medicare Other | Admitting: Internal Medicine

## 2017-04-10 ENCOUNTER — Ambulatory Visit (INDEPENDENT_AMBULATORY_CARE_PROVIDER_SITE_OTHER)
Admission: RE | Admit: 2017-04-10 | Discharge: 2017-04-10 | Disposition: A | Discharge status: Home / Self Care | Payer: Medicare Other | Source: Ambulatory Visit | Attending: Internal Medicine | Admitting: Internal Medicine

## 2017-04-10 ENCOUNTER — Encounter: Payer: Self-pay | Admitting: Internal Medicine

## 2017-04-10 VITALS — BP 120/70 | HR 70 | Temp 97.7°F | Resp 16 | Ht 73.0 in | Wt 193.0 lb

## 2017-04-10 DIAGNOSIS — M25552 Pain in left hip: Secondary | ICD-10-CM

## 2017-04-10 MED ORDER — OXYCODONE HCL 5 MG PO TABS: 5.0000 mg | ORAL_TABLET | Freq: Four times a day (QID) | ORAL | 0 refills | Status: DC | PRN

## 2017-04-10 NOTE — Progress Notes (Signed)
Subjective:  Patient ID: Harold Pittman, male    DOB: 18-Jul-1940  Age: 77 y.o. MRN: [id number]  CC: Hip Pain  NEW TO ME  HPI Harold Pittman presents for a 3-week history of nontraumatic left hip pain.  The pain is so severe that he is using a walker and is limping.  He has tried to control the pain with Tylenol and ibuprofen but is not gotten any symptom relief.  The pain interferes with his sleep and activity level.  There is no low back pain and no pain that radiates into his lower extremities.  He denies numbness, weakness, or tingling in his lower extremities.  Outpatient Medications Prior to Visit  Medication Sig Dispense Refill  . albuterol (PROAIR HFA) 108 (90 BASE) MCG/ACT inhaler Inhale 2 puffs into the lungs 2 (two) times daily. 1 Inhaler 11  . amLODipine (NORVASC) 10 MG tablet Take 1 tablet (10 mg total) by mouth daily. 90 tablet 1  . aspirin 81 MG EC tablet Take 81 mg by mouth daily.      . beclomethasone (QVAR) 80 MCG/ACT inhaler Inhale 2 puffs into the lungs 2 (two) times daily. 1 Inhaler 11  . carvedilol (COREG) 25 MG tablet TAKE 1 TABLET BY MOUTH TWO  TIMES DAILY WITH MEALS 180 tablet 1  . Cholecalciferol 1000 UNITS tablet Take 2,000 Units by mouth daily.      . Cyanocobalamin (VITAMIN B-12) 1000 MCG SUBL Place 1 tablet (1,000 mcg total) under the tongue daily. 100 tablet 3  . docusate sodium (COLACE) 100 MG capsule Take 1 capsule (100 mg total) by mouth 2 (two) times daily. 10 capsule 0  . furosemide (LASIX) 20 MG tablet Take 1 tablet (20 mg total) by mouth every morning. 90 tablet 1  . guaiFENesin (MUCINEX) 600 MG 12 hr tablet Take 1 tablet (600 mg total) by mouth 2 (two) times daily as needed for cough or to loosen phlegm. 14 tablet 0  . hydrALAZINE (APRESOLINE) 50 MG tablet Take 1 tablet (50 mg total) by mouth 3 (three) times daily. 270 tablet 1  . ipratropium (ATROVENT) 0.03 % nasal spray Place 2 sprays into both nostrils 2 (two) times daily. Do not use for more than  5days. 30 mL 0  . irbesartan (AVAPRO) 150 MG tablet Take 1 tablet (150 mg total) by mouth daily. 30 tablet 11  . loperamide (IMODIUM) 2 MG capsule Take 1 capsule by mouth 4  times daily as needed for  diarrhea or loose stools 180 capsule 3  . metFORMIN (GLUCOPHAGE XR) 750 MG 24 hr tablet Take 1 tablet (750 mg total) by mouth daily with breakfast. 30 tablet 11  . omeprazole (PRILOSEC) 20 MG capsule Take 1 capsule (20 mg total) by mouth daily. 90 capsule 1  . pravastatin (PRAVACHOL) 80 MG tablet Take 1 tablet (80 mg total) by mouth at bedtime. 90 tablet 1  . ranitidine (ZANTAC) 300 MG tablet Take 1 tablet (300 mg total) by mouth every evening. 90 tablet 1  . tadalafil (CIALIS) 20 MG tablet Take as directed 10 tablet 5  . zolpidem (AMBIEN) 5 MG tablet Take 1 tablet (5 mg total) by mouth at bedtime as needed for sleep. 30 tablet 5  . ferrous sulfate 325 (65 FE) MG tablet Take 1 tablet (325 mg total) by mouth daily. 90 tablet 3  . azithromycin (ZITHROMAX Z-PAK) 250 MG tablet As directed 6 tablet 0  . promethazine-codeine (PHENERGAN WITH CODEINE) 6.25-10 MG/5ML syrup Take 5 mLs  every 4 (four) hours as needed by mouth. 300 mL 0   No facility-administered medications prior to visit.     ROS Review of Systems  Constitutional: Negative for chills, fatigue and fever.  HENT: Negative.   Eyes: Negative.  Negative for visual disturbance.  Respiratory: Negative.  Negative for cough, chest tightness, shortness of breath and wheezing.   Cardiovascular: Negative for chest pain and palpitations.  Gastrointestinal: Negative for abdominal pain, constipation, diarrhea, nausea and vomiting.  Endocrine: Negative.   Genitourinary: Negative.  Negative for difficulty urinating.  Musculoskeletal: Positive for arthralgias. Negative for back pain, joint swelling, myalgias and neck pain.  Skin: Negative for color change and rash.  Allergic/Immunologic: Negative.   Neurological: Negative.  Negative for weakness and  numbness.  Hematological: Negative for adenopathy. Does not bruise/bleed easily.  Psychiatric/Behavioral: Negative.     Objective:  BP 120/70 (BP Location: Left Arm, Patient Position: Sitting, Cuff Size: Large)   Pulse 70   Temp 97.7 F (36.5 C) (Oral)   Resp 16   Ht 6\' 1"  (1.854 m)   Wt 193 lb (87.5 kg)   SpO2 97%   BMI 25.46 kg/m   BP Readings from Last 3 Encounters:  04/10/17 120/70  02/11/17 (!) 142/68  12/31/16 138/64    Wt Readings from Last 3 Encounters:  04/10/17 193 lb (87.5 kg)  02/11/17 189 lb (85.7 kg)  12/31/16 188 lb (85.3 kg)    Physical Exam  Constitutional: He is oriented to person, place, and time. No distress.  HENT:  Mouth/Throat: Oropharynx is clear and moist. No oropharyngeal exudate.  Eyes: Conjunctivae are normal. Left eye exhibits no discharge. No scleral icterus.  Neck: Normal range of motion. Neck supple. No JVD present. No thyromegaly present.  Cardiovascular: Normal rate, regular rhythm and normal heart sounds.  No murmur heard. Pulmonary/Chest: Effort normal and breath sounds normal. No respiratory distress. He has no wheezes. He has no rales.  Abdominal: Soft. Bowel sounds are normal. He exhibits no mass. There is no tenderness. There is no guarding.  Musculoskeletal: Normal range of motion. He exhibits no edema, tenderness or deformity.       Left hip: Normal. He exhibits normal range of motion, no tenderness, no bony tenderness, no swelling, no crepitus and no deformity.  His gait is antalgic His neuro exam is intact  Neurological: He is alert and oriented to person, place, and time.  Skin: Skin is warm. No rash noted. He is not diaphoretic.  Vitals reviewed.   Lab Results  Component Value Date   WBC 7.1 01/30/2016   HGB 9.0 (L) 01/30/2016   HCT 28.3 (L) 01/30/2016   PLT 257.0 01/30/2016   GLUCOSE 161 (H) 09/19/2016   CHOL 112 09/19/2016   TRIG 72.0 09/19/2016   HDL 32.50 (L) 09/19/2016   LDLCALC 65 09/19/2016   ALT 12 (L)  07/23/2015   AST 15 07/23/2015   NA 143 09/19/2016   K 3.9 09/19/2016   CL 107 09/19/2016   CREATININE 1.08 09/19/2016   BUN 16 09/19/2016   CO2 28 09/19/2016   TSH 0.91 10/08/2011   PSA 1.31 03/22/2010   INR 1.35 07/23/2015   HGBA1C 6.4 09/19/2016   MICROALBUR 38.7 (H) 11/24/2013    Dg Chest 2 View  Result Date: 01/01/2017 CLINICAL DATA:  Wheezing, dry cough for 1 week, hypertension, diabetes mellitus, former smoker, prior CABG EXAM: CHEST  2 VIEW COMPARISON:  07/23/2015 FINDINGS: Normal heart size post CABG. Atherosclerotic calcification aorta. Mediastinal contours and  pulmonary vascularity normal. Lungs clear. No pulmonary infiltrate, pleural effusion or pneumothorax. Osseous structures unremarkable. IMPRESSION: Post CABG. No acute abnormalities. Electronically Signed   By: Lavonia Dana M.D.   On: 01/01/2017 08:37   Dg Hip Unilat With Pelvis 2-3 Views Left  Result Date: 04/10/2017 CLINICAL DATA:  Left hip pain for 2 weeks, no known injury, initial encounter EXAM: DG HIP (WITH OR WITHOUT PELVIS) 2V LEFT COMPARISON:  None. FINDINGS: There is no evidence of hip fracture or dislocation. There is no evidence of arthropathy or other focal bone abnormality. IMPRESSION: No acute abnormality noted. Electronically Signed   By: Inez Catalina M.D.   On: 04/10/2017 13:41    Assessment & Plan:   Harold Pittman was seen today for hip pain.  Diagnoses and all orders for this visit:  Acute hip pain, left- he has severe pain that is interfering with his activities and making it difficult for him to walk.  He has tried to control the pain with acetaminophen and ibuprofen but still has significant pain.  Will offer oxycodone for additional symptom relief.  Plain x-ray is not revealing for any pathology.  I have asked him to see sports medicine for further evaluation as to the cause of the hip pain. -     Cancel: DG HIP UNILAT WITH PELVIS MIN 4 VIEWS LEFT; Future -     oxyCODONE (OXY IR/ROXICODONE) 5 MG  immediate release tablet; Take 1 tablet (5 mg total) by mouth every 6 (six) hours as needed for severe pain. -     Ambulatory referral to Sports Medicine   I have discontinued Harold Pittman's promethazine-codeine and azithromycin. I am also having him start on oxyCODONE. Additionally, I am having him maintain his aspirin, Cholecalciferol, albuterol, beclomethasone, tadalafil, zolpidem, loperamide, docusate sodium, Vitamin B-12, ferrous sulfate, guaiFENesin, ipratropium, hydrALAZINE, ranitidine, omeprazole, amLODipine, pravastatin, furosemide, carvedilol, metFORMIN, and irbesartan.  Meds ordered this encounter  Medications  . oxyCODONE (OXY IR/ROXICODONE) 5 MG immediate release tablet    Sig: Take 1 tablet (5 mg total) by mouth every 6 (six) hours as needed for severe pain.    Dispense:  30 tablet    Refill:  0     Follow-up: No Follow-up on file.  Scarlette Calico, MD

## 2017-04-11 ENCOUNTER — Encounter: Payer: Self-pay | Admitting: Internal Medicine

## 2017-04-13 ENCOUNTER — Encounter: Payer: Self-pay | Admitting: Internal Medicine

## 2017-04-13 NOTE — Patient Instructions (Signed)

## 2017-04-26 ENCOUNTER — Ambulatory Visit: Payer: Medicare Other | Admitting: Internal Medicine

## 2017-04-26 ENCOUNTER — Encounter: Payer: Self-pay | Admitting: Internal Medicine

## 2017-04-26 DIAGNOSIS — H6122 Impacted cerumen, left ear: Secondary | ICD-10-CM | POA: Diagnosis not present

## 2017-04-26 DIAGNOSIS — M25512 Pain in left shoulder: Secondary | ICD-10-CM | POA: Diagnosis not present

## 2017-04-26 DIAGNOSIS — I10 Essential (primary) hypertension: Secondary | ICD-10-CM

## 2017-04-26 DIAGNOSIS — E1151 Type 2 diabetes mellitus with diabetic peripheral angiopathy without gangrene: Secondary | ICD-10-CM | POA: Diagnosis not present

## 2017-04-26 MED ORDER — MELOXICAM 7.5 MG PO TABS: 7.5000 mg | ORAL_TABLET | Freq: Every day | ORAL | 1 refills | Status: DC

## 2017-04-26 MED ORDER — METHYLPREDNISOLONE ACETATE 80 MG/ML IJ SUSP
80.0000 mg | Freq: Once | INTRAMUSCULAR | Status: AC
  Administered 2017-04-26: 80 mg via INTRA_ARTICULAR

## 2017-04-26 NOTE — Progress Notes (Signed)
Subjective:  Patient ID: Harold Pittman, male    DOB: February 12, 1941  Age: 77 y.o. MRN: [id number]  CC: No chief complaint on file.   HPI Harold Pittman presents for L shoulder and arm pain x 3 weeks. Pain is constant, worse w/ROM.  C/o L ear itching, discomfort.  F/u HTN  Outpatient Medications Prior to Visit  Medication Sig Dispense Refill  . albuterol (PROAIR HFA) 108 (90 BASE) MCG/ACT inhaler Inhale 2 puffs into the lungs 2 (two) times daily. 1 Inhaler 11  . amLODipine (NORVASC) 10 MG tablet Take 1 tablet (10 mg total) by mouth daily. 90 tablet 1  . aspirin 81 MG EC tablet Take 81 mg by mouth daily.      . beclomethasone (QVAR) 80 MCG/ACT inhaler Inhale 2 puffs into the lungs 2 (two) times daily. 1 Inhaler 11  . carvedilol (COREG) 25 MG tablet TAKE 1 TABLET BY MOUTH TWO  TIMES DAILY WITH MEALS 180 tablet 1  . Cholecalciferol 1000 UNITS tablet Take 2,000 Units by mouth daily.      . Cyanocobalamin (VITAMIN B-12) 1000 MCG SUBL Place 1 tablet (1,000 mcg total) under the tongue daily. 100 tablet 3  . docusate sodium (COLACE) 100 MG capsule Take 1 capsule (100 mg total) by mouth 2 (two) times daily. 10 capsule 0  . furosemide (LASIX) 20 MG tablet Take 1 tablet (20 mg total) by mouth every morning. 90 tablet 1  . guaiFENesin (MUCINEX) 600 MG 12 hr tablet Take 1 tablet (600 mg total) by mouth 2 (two) times daily as needed for cough or to loosen phlegm. 14 tablet 0  . hydrALAZINE (APRESOLINE) 50 MG tablet Take 1 tablet (50 mg total) by mouth 3 (three) times daily. 270 tablet 1  . ipratropium (ATROVENT) 0.03 % nasal spray Place 2 sprays into both nostrils 2 (two) times daily. Do not use for more than 5days. 30 mL 0  . irbesartan (AVAPRO) 150 MG tablet Take 1 tablet (150 mg total) by mouth daily. 30 tablet 11  . loperamide (IMODIUM) 2 MG capsule Take 1 capsule by mouth 4  times daily as needed for  diarrhea or loose stools 180 capsule 3  . metFORMIN (GLUCOPHAGE XR) 750 MG 24 hr tablet Take 1  tablet (750 mg total) by mouth daily with breakfast. 30 tablet 11  . omeprazole (PRILOSEC) 20 MG capsule Take 1 capsule (20 mg total) by mouth daily. 90 capsule 1  . oxyCODONE (OXY IR/ROXICODONE) 5 MG immediate release tablet Take 1 tablet (5 mg total) by mouth every 6 (six) hours as needed for severe pain. 30 tablet 0  . pravastatin (PRAVACHOL) 80 MG tablet Take 1 tablet (80 mg total) by mouth at bedtime. 90 tablet 1  . ranitidine (ZANTAC) 300 MG tablet Take 1 tablet (300 mg total) by mouth every evening. 90 tablet 1  . tadalafil (CIALIS) 20 MG tablet Take as directed 10 tablet 5  . zolpidem (AMBIEN) 5 MG tablet Take 1 tablet (5 mg total) by mouth at bedtime as needed for sleep. 30 tablet 5  . ferrous sulfate 325 (65 FE) MG tablet Take 1 tablet (325 mg total) by mouth daily. 90 tablet 3   No facility-administered medications prior to visit.     ROS Review of Systems  Constitutional: Negative for appetite change, fatigue and unexpected weight change.  HENT: Positive for hearing loss. Negative for congestion, nosebleeds, sneezing, sore throat and trouble swallowing.   Eyes: Negative for itching and visual disturbance.  Respiratory: Negative for cough.   Cardiovascular: Negative for chest pain, palpitations and leg swelling.  Gastrointestinal: Negative for abdominal distention, blood in stool, diarrhea and nausea.  Genitourinary: Negative for frequency and hematuria.  Musculoskeletal: Positive for arthralgias. Negative for back pain, gait problem, joint swelling and neck pain.  Skin: Negative for rash.  Neurological: Negative for dizziness, tremors, speech difficulty and weakness.  Psychiatric/Behavioral: Negative for agitation, dysphoric mood and sleep disturbance. The patient is not nervous/anxious.     Objective:  BP 126/72 (BP Location: Left Arm, Patient Position: Sitting, Cuff Size: Large)   Pulse 64   Temp 98.4 F (36.9 C) (Oral)   Ht 6\' 1"  (1.854 m)   Wt 194 lb (88 kg)   SpO2  99%   BMI 25.60 kg/m   BP Readings from Last 3 Encounters:  04/26/17 126/72  04/10/17 120/70  02/11/17 (!) 142/68    Wt Readings from Last 3 Encounters:  04/26/17 194 lb (88 kg)  04/10/17 193 lb (87.5 kg)  02/11/17 189 lb (85.7 kg)    Physical Exam  Constitutional: He is oriented to person, place, and time. He appears well-developed. No distress.  NAD  HENT:  Mouth/Throat: Oropharynx is clear and moist.  Eyes: Conjunctivae are normal. Pupils are equal, round, and reactive to light.  Neck: Normal range of motion. No JVD present. No thyromegaly present.  Cardiovascular: Normal rate, regular rhythm, normal heart sounds and intact distal pulses. Exam reveals no gallop and no friction rub.  No murmur heard. Pulmonary/Chest: Effort normal and breath sounds normal. No respiratory distress. He has no wheezes. He has no rales. He exhibits no tenderness.  Abdominal: Soft. Bowel sounds are normal. He exhibits no distension and no mass. There is no tenderness. There is no rebound and no guarding.  Musculoskeletal: Normal range of motion. He exhibits tenderness. He exhibits no edema.  Lymphadenopathy:    He has no cervical adenopathy.  Neurological: He is alert and oriented to person, place, and time. He has normal reflexes. No cranial nerve deficit. He exhibits normal muscle tone. He displays a negative Romberg sign. Coordination and gait normal.  Skin: Skin is warm and dry. No rash noted.  Psychiatric: He has a normal mood and affect. His behavior is normal. Judgment and thought content normal.    L ear wax  L shoulder w/ROM   Procedure Note :     Procedure :  Ear irrigation   Indication:  Cerumen impaction L   Risks, including pain, dizziness, eardrum perforation, bleeding, infection and others as well as benefits were explained to the patient in detail. Verbal consent was obtained and the patient agreed to proceed.    We used "The Elephant Ear Irrigation Device" filled with  lukewarm water for irrigation. A large amount wax was recovered. Procedure has also required manual wax removal with an ear wax curette and ear forceps.   Tolerated well. Complications: None.      Postprocedure instructions :  Call if problems.   Procedure :Joint Injection,  L shoulder   Indication:  Subacromial bursitis with refractory  chronic pain.   Risks including unsuccessful procedure , bleeding, infection, bruising, skin atrophy, "steroid flare-up" and others were explained to the patient in detail as well as the benefits. Informed consent was obtained and signed.   Tthe patient was placed in a comfortable position. Lateral approach was used. Skin was prepped with Betadine and alcohol  and anesthetized with a cooling spray. Then, a 5 cc syringe with  a 2 inch long 24-gauge needle was used for a joint injection.. The needle was advanced  Into the subacromial space.The bursa was injected with 3 mL of 2% lidocaine and 80 mg of Depo-Medrol .  Band-Aid was applied.   Tolerated well. Complications: None. Good pain relief following the procedure.      Lab Results  Component Value Date   WBC 7.1 01/30/2016   HGB 9.0 (L) 01/30/2016   HCT 28.3 (L) 01/30/2016   PLT 257.0 01/30/2016   GLUCOSE 161 (H) 09/19/2016   CHOL 112 09/19/2016   TRIG 72.0 09/19/2016   HDL 32.50 (L) 09/19/2016   LDLCALC 65 09/19/2016   ALT 12 (L) 07/23/2015   AST 15 07/23/2015   NA 143 09/19/2016   K 3.9 09/19/2016   CL 107 09/19/2016   CREATININE 1.08 09/19/2016   BUN 16 09/19/2016   CO2 28 09/19/2016   TSH 0.91 10/08/2011   PSA 1.31 03/22/2010   INR 1.35 07/23/2015   HGBA1C 6.4 09/19/2016   MICROALBUR 38.7 (H) 11/24/2013    Dg Hip Unilat With Pelvis 2-3 Views Left  Result Date: 04/10/2017 CLINICAL DATA:  Left hip pain for 2 weeks, no known injury, initial encounter EXAM: DG HIP (WITH OR WITHOUT PELVIS) 2V LEFT COMPARISON:  None. FINDINGS: There is no evidence of hip fracture or dislocation. There is  no evidence of arthropathy or other focal bone abnormality. IMPRESSION: No acute abnormality noted. Electronically Signed   By: Inez Catalina M.D.   On: 04/10/2017 13:41    Assessment & Plan:   There are no diagnoses linked to this encounter. I am having Harold Pittman maintain his aspirin, Cholecalciferol, albuterol, beclomethasone, tadalafil, zolpidem, loperamide, docusate sodium, Vitamin B-12, ferrous sulfate, guaiFENesin, ipratropium, hydrALAZINE, ranitidine, omeprazole, amLODipine, pravastatin, furosemide, carvedilol, metFORMIN, irbesartan, and oxyCODONE.  No orders of the defined types were placed in this encounter.    Follow-up: No Follow-up on file.  Walker Kehr, MD

## 2017-04-26 NOTE — Assessment & Plan Note (Signed)
Coreg, Hydralazine, Lasix

## 2017-04-26 NOTE — Addendum Note (Signed)
Addended by: Karren Cobble on: 04/26/2017 04:46 PM   Modules accepted: Orders

## 2017-04-26 NOTE — Assessment & Plan Note (Signed)
See procedure 

## 2017-04-26 NOTE — Assessment & Plan Note (Signed)
Metformin, Pravastatin, ASA, Coreg 

## 2017-04-26 NOTE — Assessment & Plan Note (Signed)
See Procedure Meloxicam Rx w/caution

## 2017-04-26 NOTE — Patient Instructions (Signed)
Postprocedure instructions :    A Band-Aid should be left on for 12 hours. Injection therapy is not a cure itself. It is used in conjunction with other modalities. You can use nonsteroidal anti-inflammatories like ibuprofen , hot and cold compresses. Rest is recommended in the next 24 hours. You need to report immediately  if fever, chills or any signs of infection develop. 

## 2017-05-03 ENCOUNTER — Ambulatory Visit: Payer: Self-pay | Admitting: *Deleted

## 2017-05-03 DIAGNOSIS — M25552 Pain in left hip: Secondary | ICD-10-CM

## 2017-05-03 NOTE — Telephone Encounter (Signed)
Please advise 

## 2017-05-03 NOTE — Telephone Encounter (Signed)
Pt is complaining of left arm pain between left shoulder and elbow. Pt currently rating pain 8 on a scale of 1-10. Pt had OV on 04/26/17 with Dr. Alain Marion and was prescribed Meloxicam, which the pt states has not been helping. Pt was seen in the office by Dr. Ronnald Ramp priot to the 1/18 appt  for hip pain and was prescribed oxycodone, which the pt has been taking for his arm pain as well with some improvement. Pt states he has about 15 pills left of oxycodone. Pt asking if he could possibly have a refill of oxycodone to help with his arm pain or if he could be prescribed another medication due to the Meloxicam not helping. Reason for Disposition . Arm pain  Answer Assessment - Initial Assessment Questions 1. ONSET: "When did the pain start?"     About a week or longer 2. LOCATION: "Where is the pain located?"     Between shoulder and elbow 3. PAIN: "How bad is the pain?" (Scale 1-10; or mild, moderate, severe)   - MILD (1-3): doesn't interfere with normal activities   - MODERATE (4-7): interferes with normal activities (e.g., work or school) or awakens from sleep   - SEVERE (8-10): excruciating pain, unable to do any normal activities, unable to hold a cup of water     8 4. WORK OR EXERCISE: "Has there been any recent work or exercise that involved this part of the body?"     No 5. CAUSE: "What do you think is causing the arm pain?"     Unsure 6. OTHER SYMPTOMS: "Do you have any other symptoms?" (e.g., neck pain, swelling, rash, fever, numbness, weakness)     No  Protocols used: ARM PAIN-A-AH

## 2017-05-06 MED ORDER — OXYCODONE HCL 5 MG PO TABS: 5.0000 mg | ORAL_TABLET | Freq: Four times a day (QID) | ORAL | 0 refills | Status: DC | PRN

## 2017-05-06 NOTE — Telephone Encounter (Signed)
I will email a prescription for oxycodone.  Follow-up with me if not better.  Thank you,

## 2017-05-06 NOTE — Addendum Note (Signed)
Addended by: Cassandria Anger on: 05/06/2017 11:54 PM   Modules accepted: Orders

## 2017-05-07 NOTE — Telephone Encounter (Signed)
Pt.notified

## 2017-07-01 ENCOUNTER — Other Ambulatory Visit: Payer: Self-pay | Admitting: Internal Medicine

## 2017-07-25 ENCOUNTER — Encounter: Payer: Self-pay | Admitting: Internal Medicine

## 2017-07-25 ENCOUNTER — Other Ambulatory Visit (INDEPENDENT_AMBULATORY_CARE_PROVIDER_SITE_OTHER): Payer: Medicare Other

## 2017-07-25 ENCOUNTER — Ambulatory Visit: Payer: Medicare Other | Admitting: Internal Medicine

## 2017-07-25 VITALS — BP 124/66 | HR 63 | Temp 98.4°F | Ht 73.0 in | Wt 195.0 lb

## 2017-07-25 DIAGNOSIS — Z86010 Personal history of colonic polyps: Secondary | ICD-10-CM | POA: Diagnosis not present

## 2017-07-25 DIAGNOSIS — M1000 Idiopathic gout, unspecified site: Secondary | ICD-10-CM

## 2017-07-25 DIAGNOSIS — K635 Polyp of colon: Secondary | ICD-10-CM | POA: Diagnosis not present

## 2017-07-25 DIAGNOSIS — F411 Generalized anxiety disorder: Secondary | ICD-10-CM

## 2017-07-25 DIAGNOSIS — M545 Low back pain, unspecified: Secondary | ICD-10-CM

## 2017-07-25 DIAGNOSIS — E1151 Type 2 diabetes mellitus with diabetic peripheral angiopathy without gangrene: Secondary | ICD-10-CM

## 2017-07-25 DIAGNOSIS — I25810 Atherosclerosis of coronary artery bypass graft(s) without angina pectoris: Secondary | ICD-10-CM | POA: Diagnosis not present

## 2017-07-25 DIAGNOSIS — I10 Essential (primary) hypertension: Secondary | ICD-10-CM

## 2017-07-25 DIAGNOSIS — R269 Unspecified abnormalities of gait and mobility: Secondary | ICD-10-CM

## 2017-07-25 LAB — LIPID PANEL
CHOLESTEROL: 102 mg/dL (ref 0–200)
HDL: 33.2 mg/dL — AB (ref >39.00)
LDL Cholesterol: 53 mg/dL (ref 0–99)
NonHDL: 68.73
Total CHOL/HDL Ratio: 3
Triglycerides: 78 mg/dL (ref 0.0–149.0)
VLDL: 15.6 mg/dL (ref 0.0–40.0)

## 2017-07-25 LAB — BASIC METABOLIC PANEL
BUN: 11 mg/dL (ref 6–23)
CO2: 29 mEq/L (ref 19–32)
CREATININE: 1.34 mg/dL (ref 0.40–1.50)
Calcium: 8.9 mg/dL (ref 8.4–10.5)
Chloride: 106 mEq/L (ref 96–112)
GFR: 66.6 mL/min (ref >60.00)
Glucose, Bld: 169 mg/dL — ABNORMAL HIGH (ref 70–99)
POTASSIUM: 3.8 meq/L (ref 3.5–5.1)
Sodium: 143 mEq/L (ref 135–145)

## 2017-07-25 LAB — HEMOGLOBIN A1C: HEMOGLOBIN A1C: 6.7 % — AB (ref 4.6–6.5)

## 2017-07-25 NOTE — Assessment & Plan Note (Signed)
No relapse recently

## 2017-07-25 NOTE — Assessment & Plan Note (Signed)
Doing well 

## 2017-07-25 NOTE — Assessment & Plan Note (Signed)
Metformin, Pravastatin, ASA, Coreg Labs

## 2017-07-25 NOTE — Assessment & Plan Note (Signed)
GI ref Dr Hilarie Fredrickson

## 2017-07-25 NOTE — Progress Notes (Signed)
Subjective:  Patient ID: Harold Pittman, male    DOB: Nov 08, 1940  Age: 77 y.o. MRN: [id number]  CC: No chief complaint on file.   HPI Harold Pittman presents for HTN, CAD, DM f/u  Outpatient Medications Prior to Visit  Medication Sig Dispense Refill  . albuterol (PROAIR HFA) 108 (90 BASE) MCG/ACT inhaler Inhale 2 puffs into the lungs 2 (two) times daily. 1 Inhaler 11  . amLODipine (NORVASC) 10 MG tablet TAKE 1 TABLET BY MOUTH  DAILY 90 tablet 3  . aspirin 81 MG EC tablet Take 81 mg by mouth daily.      . beclomethasone (QVAR) 80 MCG/ACT inhaler Inhale 2 puffs into the lungs 2 (two) times daily. 1 Inhaler 11  . carvedilol (COREG) 25 MG tablet TAKE 1 TABLET BY MOUTH TWO  TIMES DAILY WITH MEALS 180 tablet 3  . Cholecalciferol 1000 UNITS tablet Take 2,000 Units by mouth daily.      . Cyanocobalamin (VITAMIN B-12) 1000 MCG SUBL Place 1 tablet (1,000 mcg total) under the tongue daily. 100 tablet 3  . docusate sodium (COLACE) 100 MG capsule Take 1 capsule (100 mg total) by mouth 2 (two) times daily. 10 capsule 0  . furosemide (LASIX) 20 MG tablet TAKE 1 TABLET BY MOUTH  EVERY MORNING 90 tablet 3  . guaiFENesin (MUCINEX) 600 MG 12 hr tablet Take 1 tablet (600 mg total) by mouth 2 (two) times daily as needed for cough or to loosen phlegm. 14 tablet 0  . hydrALAZINE (APRESOLINE) 50 MG tablet TAKE 1 TABLET BY MOUTH 3  TIMES DAILY 270 tablet 3  . ipratropium (ATROVENT) 0.03 % nasal spray Place 2 sprays into both nostrils 2 (two) times daily. Do not use for more than 5days. 30 mL 0  . irbesartan (AVAPRO) 150 MG tablet Take 1 tablet (150 mg total) by mouth daily. 30 tablet 11  . loperamide (IMODIUM) 2 MG capsule Take 1 capsule by mouth 4  times daily as needed for  diarrhea or loose stools 180 capsule 3  . meloxicam (MOBIC) 7.5 MG tablet Take 1 tablet (7.5 mg total) by mouth daily. 15 tablet 1  . metFORMIN (GLUCOPHAGE XR) 750 MG 24 hr tablet Take 1 tablet (750 mg total) by mouth daily with breakfast.  30 tablet 11  . omeprazole (PRILOSEC) 20 MG capsule TAKE 1 CAPSULE BY MOUTH  DAILY 90 capsule 3  . oxyCODONE (OXY IR/ROXICODONE) 5 MG immediate release tablet Take 1 tablet (5 mg total) by mouth every 6 (six) hours as needed for severe pain. 28 tablet 0  . pravastatin (PRAVACHOL) 80 MG tablet TAKE 1 TABLET BY MOUTH AT  BEDTIME 90 tablet 3  . ranitidine (ZANTAC) 300 MG tablet TAKE 1 TABLET BY MOUTH  EVERY EVENING 90 tablet 3  . tadalafil (CIALIS) 20 MG tablet Take as directed 10 tablet 5  . zolpidem (AMBIEN) 5 MG tablet Take 1 tablet (5 mg total) by mouth at bedtime as needed for sleep. 30 tablet 5  . ferrous sulfate 325 (65 FE) MG tablet Take 1 tablet (325 mg total) by mouth daily. 90 tablet 3   No facility-administered medications prior to visit.     ROS Review of Systems  Constitutional: Negative for appetite change, fatigue and unexpected weight change.  HENT: Negative for congestion, nosebleeds, sneezing, sore throat and trouble swallowing.   Eyes: Negative for itching and visual disturbance.  Respiratory: Negative for cough.   Cardiovascular: Negative for chest pain, palpitations and leg swelling.  Gastrointestinal: Negative for abdominal distention, blood in stool, diarrhea and nausea.  Genitourinary: Negative for frequency and hematuria.  Musculoskeletal: Positive for arthralgias. Negative for back pain, gait problem, joint swelling and neck pain.  Skin: Negative for rash.  Neurological: Negative for dizziness, tremors, speech difficulty and weakness.  Psychiatric/Behavioral: Negative for agitation, dysphoric mood and sleep disturbance. The patient is not nervous/anxious.     Objective:  BP 124/66 (BP Location: Left Arm, Patient Position: Sitting, Cuff Size: Large)   Pulse 63   Temp 98.4 F (36.9 C) (Oral)   Ht 6\' 1"  (1.854 m)   Wt 195 lb (88.5 kg)   SpO2 98%   BMI 25.73 kg/m   BP Readings from Last 3 Encounters:  07/25/17 124/66  04/26/17 126/72  04/10/17 120/70     Wt Readings from Last 3 Encounters:  07/25/17 195 lb (88.5 kg)  04/26/17 194 lb (88 kg)  04/10/17 193 lb (87.5 kg)    Physical Exam  Constitutional: He is oriented to person, place, and time. He appears well-developed. No distress.  NAD  HENT:  Mouth/Throat: Oropharynx is clear and moist.  Eyes: Pupils are equal, round, and reactive to light. Conjunctivae are normal.  Neck: Normal range of motion. No JVD present. No thyromegaly present.  Cardiovascular: Normal rate, regular rhythm, normal heart sounds and intact distal pulses. Exam reveals no gallop and no friction rub.  No murmur heard. Pulmonary/Chest: Effort normal and breath sounds normal. No respiratory distress. He has no wheezes. He has no rales. He exhibits no tenderness.  Abdominal: Soft. Bowel sounds are normal. He exhibits no distension and no mass. There is no tenderness. There is no rebound and no guarding.  Musculoskeletal: Normal range of motion. He exhibits no edema or tenderness.  Lymphadenopathy:    He has no cervical adenopathy.  Neurological: He is alert and oriented to person, place, and time. He has normal reflexes. No cranial nerve deficit. He exhibits normal muscle tone. He displays a negative Romberg sign. Coordination and gait normal.  Skin: Skin is warm and dry. No rash noted.  Psychiatric: He has a normal mood and affect. His behavior is normal. Judgment and thought content normal.    Lab Results  Component Value Date   WBC 7.1 01/30/2016   HGB 9.0 (L) 01/30/2016   HCT 28.3 (L) 01/30/2016   PLT 257.0 01/30/2016   GLUCOSE 161 (H) 09/19/2016   CHOL 112 09/19/2016   TRIG 72.0 09/19/2016   HDL 32.50 (L) 09/19/2016   LDLCALC 65 09/19/2016   ALT 12 (L) 07/23/2015   AST 15 07/23/2015   NA 143 09/19/2016   K 3.9 09/19/2016   CL 107 09/19/2016   CREATININE 1.08 09/19/2016   BUN 16 09/19/2016   CO2 28 09/19/2016   TSH 0.91 10/08/2011   PSA 1.31 03/22/2010   INR 1.35 07/23/2015   HGBA1C 6.4  09/19/2016   MICROALBUR 38.7 (H) 11/24/2013    Dg Hip Unilat With Pelvis 2-3 Views Left  Result Date: 04/10/2017 CLINICAL DATA:  Left hip pain for 2 weeks, no known injury, initial encounter EXAM: DG HIP (WITH OR WITHOUT PELVIS) 2V LEFT COMPARISON:  None. FINDINGS: There is no evidence of hip fracture or dislocation. There is no evidence of arthropathy or other focal bone abnormality. IMPRESSION: No acute abnormality noted. Electronically Signed   By: Inez Catalina M.D.   On: 04/10/2017 13:41    Assessment & Plan:   There are no diagnoses linked to this encounter. I am  having Harold Pittman maintain his aspirin, Cholecalciferol, albuterol, beclomethasone, tadalafil, zolpidem, loperamide, docusate sodium, Vitamin B-12, ferrous sulfate, guaiFENesin, ipratropium, metFORMIN, irbesartan, meloxicam, oxyCODONE, furosemide, ranitidine, omeprazole, pravastatin, amLODipine, carvedilol, and hydrALAZINE.  No orders of the defined types were placed in this encounter.    Follow-up: No follow-ups on file.  Walker Kehr, MD

## 2017-09-23 ENCOUNTER — Encounter: Payer: Self-pay | Admitting: Internal Medicine

## 2017-09-23 ENCOUNTER — Ambulatory Visit (INDEPENDENT_AMBULATORY_CARE_PROVIDER_SITE_OTHER)
Admission: RE | Admit: 2017-09-23 | Discharge: 2017-09-23 | Disposition: A | Discharge status: Home / Self Care | Payer: Medicare Other | Source: Ambulatory Visit | Attending: Internal Medicine | Admitting: Internal Medicine

## 2017-09-23 ENCOUNTER — Ambulatory Visit: Payer: Medicare Other | Admitting: Internal Medicine

## 2017-09-23 ENCOUNTER — Other Ambulatory Visit (INDEPENDENT_AMBULATORY_CARE_PROVIDER_SITE_OTHER): Payer: Medicare Other

## 2017-09-23 DIAGNOSIS — R531 Weakness: Secondary | ICD-10-CM

## 2017-09-23 DIAGNOSIS — M1000 Idiopathic gout, unspecified site: Secondary | ICD-10-CM | POA: Diagnosis not present

## 2017-09-23 DIAGNOSIS — I10 Essential (primary) hypertension: Secondary | ICD-10-CM | POA: Diagnosis not present

## 2017-09-23 DIAGNOSIS — E785 Hyperlipidemia, unspecified: Secondary | ICD-10-CM | POA: Diagnosis not present

## 2017-09-23 DIAGNOSIS — E1151 Type 2 diabetes mellitus with diabetic peripheral angiopathy without gangrene: Secondary | ICD-10-CM | POA: Diagnosis not present

## 2017-09-23 DIAGNOSIS — R0602 Shortness of breath: Secondary | ICD-10-CM | POA: Diagnosis not present

## 2017-09-23 DIAGNOSIS — R0609 Other forms of dyspnea: Secondary | ICD-10-CM | POA: Diagnosis not present

## 2017-09-23 LAB — CBC WITH DIFFERENTIAL/PLATELET
BASOS ABS: 0 10*3/uL (ref 0.0–0.1)
BASOS PCT: 0.5 % (ref 0.0–3.0)
Eosinophils Absolute: 0.1 10*3/uL (ref 0.0–0.7)
Eosinophils Relative: 1.4 % (ref 0.0–5.0)
HCT: 21.9 % — CL (ref 39.0–52.0)
Hemoglobin: 6.6 g/dL — CL (ref 13.0–17.0)
LYMPHS ABS: 1.5 10*3/uL (ref 0.7–4.0)
Lymphocytes Relative: 18.6 % (ref 12.0–46.0)
MCHC: 30 g/dL (ref 30.0–36.0)
MONOS PCT: 10.9 % (ref 3.0–12.0)
Monocytes Absolute: 0.9 10*3/uL (ref 0.1–1.0)
NEUTROS ABS: 5.6 10*3/uL (ref 1.4–7.7)
NEUTROS PCT: 68.6 % (ref 43.0–77.0)
PLATELETS: 246 10*3/uL (ref 150.0–400.0)
RBC: 3.7 Mil/uL — ABNORMAL LOW (ref 4.22–5.81)
RDW: 19.5 % — AB (ref 11.5–15.5)
WBC: 8.1 10*3/uL (ref 4.0–10.5)

## 2017-09-23 LAB — BRAIN NATRIURETIC PEPTIDE: Pro B Natriuretic peptide (BNP): 305 pg/mL — ABNORMAL HIGH (ref 0.0–100.0)

## 2017-09-23 LAB — BASIC METABOLIC PANEL
BUN: 19 mg/dL (ref 6–23)
CALCIUM: 9.6 mg/dL (ref 8.4–10.5)
CHLORIDE: 108 meq/L (ref 96–112)
CO2: 27 mEq/L (ref 19–32)
CREATININE: 1.37 mg/dL (ref 0.40–1.50)
GFR: 64.89 mL/min (ref >60.00)
Glucose, Bld: 182 mg/dL — ABNORMAL HIGH (ref 70–99)
Potassium: 4 mEq/L (ref 3.5–5.1)
Sodium: 144 mEq/L (ref 135–145)

## 2017-09-23 MED ORDER — COLCHICINE 0.6 MG PO TABS: 0.6000 mg | ORAL_TABLET | Freq: Two times a day (BID) | ORAL | 3 refills | Status: DC | PRN

## 2017-09-23 MED ORDER — FUROSEMIDE 20 MG PO TABS: 40.0000 mg | ORAL_TABLET | Freq: Every morning | ORAL | 3 refills | Status: DC

## 2017-09-23 NOTE — Assessment & Plan Note (Signed)
Colchicine

## 2017-09-23 NOTE — Assessment & Plan Note (Signed)
On Rx Labs 

## 2017-09-23 NOTE — Assessment & Plan Note (Signed)
Pravachol

## 2017-09-23 NOTE — Progress Notes (Signed)
Subjective:  Patient ID: [name], male    DOB: 01-25-1941  Age: 77 y.o. MRN: [id number]  CC: No chief complaint on file.   HPI Marsh Dolly Mattox presents for DOE, fatigue x worse in the past few month F/u OA, CAD, HTN  Outpatient Medications Prior to Visit  Medication Sig Dispense Refill  . albuterol (PROAIR HFA) 108 (90 BASE) MCG/ACT inhaler Inhale 2 puffs into the lungs 2 (two) times daily. 1 Inhaler 11  . amLODipine (NORVASC) 10 MG tablet TAKE 1 TABLET BY MOUTH  DAILY 90 tablet 3  . aspirin 81 MG EC tablet Take 81 mg by mouth daily.      . beclomethasone (QVAR) 80 MCG/ACT inhaler Inhale 2 puffs into the lungs 2 (two) times daily. 1 Inhaler 11  . carvedilol (COREG) 25 MG tablet TAKE 1 TABLET BY MOUTH TWO  TIMES DAILY WITH MEALS 180 tablet 3  . Cholecalciferol 1000 UNITS tablet Take 2,000 Units by mouth daily.      . Cyanocobalamin (VITAMIN B-12) 1000 MCG SUBL Place 1 tablet (1,000 mcg total) under the tongue daily. 100 tablet 3  . docusate sodium (COLACE) 100 MG capsule Take 1 capsule (100 mg total) by mouth 2 (two) times daily. 10 capsule 0  . furosemide (LASIX) 20 MG tablet TAKE 1 TABLET BY MOUTH  EVERY MORNING 90 tablet 3  . guaiFENesin (MUCINEX) 600 MG 12 hr tablet Take 1 tablet (600 mg total) by mouth 2 (two) times daily as needed for cough or to loosen phlegm. 14 tablet 0  . hydrALAZINE (APRESOLINE) 50 MG tablet TAKE 1 TABLET BY MOUTH 3  TIMES DAILY 270 tablet 3  . ipratropium (ATROVENT) 0.03 % nasal spray Place 2 sprays into both nostrils 2 (two) times daily. Do not use for more than 5days. 30 mL 0  . irbesartan (AVAPRO) 150 MG tablet Take 1 tablet (150 mg total) by mouth daily. 30 tablet 11  . loperamide (IMODIUM) 2 MG capsule Take 1 capsule by mouth 4  times daily as needed for  diarrhea or loose stools 180 capsule 3  . meloxicam (MOBIC) 7.5 MG tablet Take 1 tablet (7.5 mg total) by mouth daily. 15 tablet 1  . metFORMIN (GLUCOPHAGE XR) 750 MG 24 hr tablet Take 1 tablet  (750 mg total) by mouth daily with breakfast. 30 tablet 11  . omeprazole (PRILOSEC) 20 MG capsule TAKE 1 CAPSULE BY MOUTH  DAILY 90 capsule 3  . oxyCODONE (OXY IR/ROXICODONE) 5 MG immediate release tablet Take 1 tablet (5 mg total) by mouth every 6 (six) hours as needed for severe pain. 28 tablet 0  . pravastatin (PRAVACHOL) 80 MG tablet TAKE 1 TABLET BY MOUTH AT  BEDTIME 90 tablet 3  . ranitidine (ZANTAC) 300 MG tablet TAKE 1 TABLET BY MOUTH  EVERY EVENING 90 tablet 3  . tadalafil (CIALIS) 20 MG tablet Take as directed 10 tablet 5  . zolpidem (AMBIEN) 5 MG tablet Take 1 tablet (5 mg total) by mouth at bedtime as needed for sleep. 30 tablet 5  . ferrous sulfate 325 (65 FE) MG tablet Take 1 tablet (325 mg total) by mouth daily. 90 tablet 3   No facility-administered medications prior to visit.     ROS: Review of Systems  Constitutional: Positive for fatigue. Negative for appetite change and unexpected weight change.  HENT: Negative for congestion, nosebleeds, sneezing, sore throat and trouble swallowing.   Eyes: Negative for itching and visual disturbance.  Respiratory: Positive for shortness  of breath. Negative for cough and chest tightness.   Cardiovascular: Negative for chest pain, palpitations and leg swelling.  Gastrointestinal: Negative for abdominal distention, blood in stool, diarrhea and nausea.  Genitourinary: Negative for frequency and hematuria.  Musculoskeletal: Positive for arthralgias. Negative for back pain, gait problem, joint swelling and neck pain.  Skin: Negative for rash.  Neurological: Negative for dizziness, tremors, speech difficulty and weakness.  Psychiatric/Behavioral: Negative for agitation, dysphoric mood and sleep disturbance. The patient is not nervous/anxious.     Objective:  BP 132/64 (BP Location: Left Arm, Patient Position: Sitting, Cuff Size: Normal)   Pulse 68   Temp 98.6 F (37 C) (Oral)   Ht 6\' 1"  (1.854 m)   Wt 194 lb (88 kg)   SpO2 98%   BMI  25.60 kg/m   BP Readings from Last 3 Encounters:  09/23/17 132/64  07/25/17 124/66  04/26/17 126/72    Wt Readings from Last 3 Encounters:  09/23/17 194 lb (88 kg)  07/25/17 195 lb (88.5 kg)  04/26/17 194 lb (88 kg)    Physical Exam  Constitutional: He is oriented to person, place, and time. He appears well-developed. No distress.  NAD  HENT:  Mouth/Throat: Oropharynx is clear and moist.  Eyes: Pupils are equal, round, and reactive to light. Conjunctivae are normal.  Neck: Normal range of motion. No JVD present. No thyromegaly present.  Cardiovascular: Normal rate, regular rhythm, normal heart sounds and intact distal pulses. Exam reveals no gallop and no friction rub.  No murmur heard. Pulmonary/Chest: Effort normal and breath sounds normal. No respiratory distress. He has no wheezes. He has no rales. He exhibits no tenderness.  Abdominal: Soft. Bowel sounds are normal. He exhibits no distension and no mass. There is no tenderness. There is no rebound and no guarding.  Musculoskeletal: Normal range of motion. He exhibits no edema or tenderness.  Lymphadenopathy:    He has no cervical adenopathy.  Neurological: He is alert and oriented to person, place, and time. He has normal reflexes. No cranial nerve deficit. He exhibits normal muscle tone. He displays a negative Romberg sign. Coordination and gait normal.  Skin: Skin is warm and dry. No rash noted.  Psychiatric: He has a normal mood and affect. His behavior is normal. Judgment and thought content normal.   bags under eyes  Lab Results  Component Value Date   WBC 7.1 01/30/2016   HGB 9.0 (L) 01/30/2016   HCT 28.3 (L) 01/30/2016   PLT 257.0 01/30/2016   GLUCOSE 169 (H) 07/25/2017   CHOL 102 07/25/2017   TRIG 78.0 07/25/2017   HDL 33.20 (L) 07/25/2017   LDLCALC 53 07/25/2017   ALT 12 (L) 07/23/2015   AST 15 07/23/2015   NA 143 07/25/2017   K 3.8 07/25/2017   CL 106 07/25/2017   CREATININE 1.34 07/25/2017   BUN 11  07/25/2017   CO2 29 07/25/2017   TSH 0.91 10/08/2011   PSA 1.31 03/22/2010   INR 1.35 07/23/2015   HGBA1C 6.7 (H) 07/25/2017   MICROALBUR 38.7 (H) 11/24/2013    Dg Hip Unilat With Pelvis 2-3 Views Left  Result Date: 04/10/2017 CLINICAL DATA:  Left hip pain for 2 weeks, no known injury, initial encounter EXAM: DG HIP (WITH OR WITHOUT PELVIS) 2V LEFT COMPARISON:  None. FINDINGS: There is no evidence of hip fracture or dislocation. There is no evidence of arthropathy or other focal bone abnormality. IMPRESSION: No acute abnormality noted. Electronically Signed   By: Inez Catalina  M.D.   On: 04/10/2017 13:41    Assessment & Plan:   There are no diagnoses linked to this encounter.   No orders of the defined types were placed in this encounter.    Follow-up: No follow-ups on file.  Walker Kehr, MD

## 2017-09-23 NOTE — Assessment & Plan Note (Signed)
Metformin, Pravastatin, ASA, Coreg 

## 2017-09-23 NOTE — Assessment & Plan Note (Addendum)
Take Furosemide 40 mg/d Stop Meloxicam Card ref - Dr Burt Knack CXR Labs

## 2017-09-23 NOTE — Patient Instructions (Signed)
Take Furosemide 40 mg/d Stop Meloxicam

## 2017-09-23 NOTE — Assessment & Plan Note (Signed)
Multifactorial Labs Card ref

## 2017-09-24 ENCOUNTER — Emergency Department (HOSPITAL_COMMUNITY): Payer: Medicare Other

## 2017-09-24 ENCOUNTER — Encounter (HOSPITAL_COMMUNITY): Payer: Self-pay | Admitting: Emergency Medicine

## 2017-09-24 ENCOUNTER — Observation Stay (HOSPITAL_COMMUNITY)
Admission: EM | Admit: 2017-09-24 | Discharge: 2017-09-26 | Disposition: A | Discharge status: Home / Self Care | Payer: Medicare Other | Attending: Family Medicine | Admitting: Family Medicine

## 2017-09-24 ENCOUNTER — Other Ambulatory Visit: Payer: Self-pay

## 2017-09-24 ENCOUNTER — Encounter: Payer: Self-pay | Admitting: Internal Medicine

## 2017-09-24 DIAGNOSIS — Z7982 Long term (current) use of aspirin: Secondary | ICD-10-CM | POA: Insufficient documentation

## 2017-09-24 DIAGNOSIS — I10 Essential (primary) hypertension: Secondary | ICD-10-CM | POA: Diagnosis not present

## 2017-09-24 DIAGNOSIS — E1122 Type 2 diabetes mellitus with diabetic chronic kidney disease: Secondary | ICD-10-CM | POA: Diagnosis not present

## 2017-09-24 DIAGNOSIS — I5030 Unspecified diastolic (congestive) heart failure: Secondary | ICD-10-CM | POA: Diagnosis present

## 2017-09-24 DIAGNOSIS — E1151 Type 2 diabetes mellitus with diabetic peripheral angiopathy without gangrene: Secondary | ICD-10-CM | POA: Diagnosis present

## 2017-09-24 DIAGNOSIS — N183 Chronic kidney disease, stage 3 unspecified: Secondary | ICD-10-CM | POA: Diagnosis present

## 2017-09-24 DIAGNOSIS — I2510 Atherosclerotic heart disease of native coronary artery without angina pectoris: Secondary | ICD-10-CM | POA: Diagnosis present

## 2017-09-24 DIAGNOSIS — M109 Gout, unspecified: Secondary | ICD-10-CM | POA: Insufficient documentation

## 2017-09-24 DIAGNOSIS — D131 Benign neoplasm of stomach: Secondary | ICD-10-CM | POA: Insufficient documentation

## 2017-09-24 DIAGNOSIS — K449 Diaphragmatic hernia without obstruction or gangrene: Secondary | ICD-10-CM | POA: Insufficient documentation

## 2017-09-24 DIAGNOSIS — K219 Gastro-esophageal reflux disease without esophagitis: Secondary | ICD-10-CM | POA: Insufficient documentation

## 2017-09-24 DIAGNOSIS — D123 Benign neoplasm of transverse colon: Secondary | ICD-10-CM | POA: Diagnosis not present

## 2017-09-24 DIAGNOSIS — I25810 Atherosclerosis of coronary artery bypass graft(s) without angina pectoris: Secondary | ICD-10-CM | POA: Insufficient documentation

## 2017-09-24 DIAGNOSIS — D125 Benign neoplasm of sigmoid colon: Secondary | ICD-10-CM

## 2017-09-24 DIAGNOSIS — Z87891 Personal history of nicotine dependence: Secondary | ICD-10-CM | POA: Insufficient documentation

## 2017-09-24 DIAGNOSIS — J449 Chronic obstructive pulmonary disease, unspecified: Secondary | ICD-10-CM | POA: Insufficient documentation

## 2017-09-24 DIAGNOSIS — K2970 Gastritis, unspecified, without bleeding: Secondary | ICD-10-CM | POA: Insufficient documentation

## 2017-09-24 DIAGNOSIS — R195 Other fecal abnormalities: Secondary | ICD-10-CM | POA: Diagnosis present

## 2017-09-24 DIAGNOSIS — D649 Anemia, unspecified: Secondary | ICD-10-CM | POA: Diagnosis not present

## 2017-09-24 DIAGNOSIS — Z7984 Long term (current) use of oral hypoglycemic drugs: Secondary | ICD-10-CM | POA: Insufficient documentation

## 2017-09-24 DIAGNOSIS — R42 Dizziness and giddiness: Secondary | ICD-10-CM | POA: Diagnosis not present

## 2017-09-24 DIAGNOSIS — R0602 Shortness of breath: Secondary | ICD-10-CM

## 2017-09-24 DIAGNOSIS — I272 Pulmonary hypertension, unspecified: Secondary | ICD-10-CM | POA: Diagnosis not present

## 2017-09-24 DIAGNOSIS — R5383 Other fatigue: Secondary | ICD-10-CM

## 2017-09-24 DIAGNOSIS — F411 Generalized anxiety disorder: Secondary | ICD-10-CM | POA: Insufficient documentation

## 2017-09-24 DIAGNOSIS — I5032 Chronic diastolic (congestive) heart failure: Secondary | ICD-10-CM | POA: Diagnosis not present

## 2017-09-24 DIAGNOSIS — Z8249 Family history of ischemic heart disease and other diseases of the circulatory system: Secondary | ICD-10-CM | POA: Insufficient documentation

## 2017-09-24 DIAGNOSIS — G319 Degenerative disease of nervous system, unspecified: Secondary | ICD-10-CM | POA: Insufficient documentation

## 2017-09-24 DIAGNOSIS — K5730 Diverticulosis of large intestine without perforation or abscess without bleeding: Secondary | ICD-10-CM | POA: Insufficient documentation

## 2017-09-24 DIAGNOSIS — D509 Iron deficiency anemia, unspecified: Principal | ICD-10-CM

## 2017-09-24 DIAGNOSIS — M1990 Unspecified osteoarthritis, unspecified site: Secondary | ICD-10-CM | POA: Insufficient documentation

## 2017-09-24 DIAGNOSIS — I130 Hypertensive heart and chronic kidney disease with heart failure and stage 1 through stage 4 chronic kidney disease, or unspecified chronic kidney disease: Secondary | ICD-10-CM | POA: Diagnosis not present

## 2017-09-24 DIAGNOSIS — D120 Benign neoplasm of cecum: Secondary | ICD-10-CM | POA: Diagnosis not present

## 2017-09-24 LAB — BASIC METABOLIC PANEL
ANION GAP: 10 (ref 5–15)
BUN: 15 mg/dL (ref 6–20)
CHLORIDE: 109 mmol/L (ref 101–111)
CO2: 25 mmol/L (ref 22–32)
Calcium: 9.5 mg/dL (ref 8.9–10.3)
Creatinine, Ser: 1.4 mg/dL — ABNORMAL HIGH (ref 0.61–1.24)
GFR calc Af Amer: 55 mL/min — ABNORMAL LOW (ref >60)
GFR, EST NON AFRICAN AMERICAN: 47 mL/min — AB (ref >60)
GLUCOSE: 129 mg/dL — AB (ref 65–99)
POTASSIUM: 4.2 mmol/L (ref 3.5–5.1)
Sodium: 144 mmol/L (ref 135–145)

## 2017-09-24 LAB — CBC
HEMATOCRIT: 26.1 % — AB (ref 39.0–52.0)
HEMOGLOBIN: 6.8 g/dL — AB (ref 13.0–17.0)
MCH: 17.2 pg — AB (ref 26.0–34.0)
MCHC: 26.1 g/dL — AB (ref 30.0–36.0)
MCV: 66.1 fL — ABNORMAL LOW (ref 78.0–100.0)
Platelets: 305 10*3/uL (ref 150–400)
RBC: 3.95 MIL/uL — ABNORMAL LOW (ref 4.22–5.81)
RDW: 19.1 % — ABNORMAL HIGH (ref 11.5–15.5)
WBC: 6.8 10*3/uL (ref 4.0–10.5)

## 2017-09-24 LAB — PREPARE RBC (CROSSMATCH)

## 2017-09-24 LAB — I-STAT TROPONIN, ED: Troponin i, poc: 0.01 ng/mL (ref 0.00–0.08)

## 2017-09-24 LAB — IRON AND TIBC
Iron: 14 ug/dL — ABNORMAL LOW (ref 45–182)
Saturation Ratios: 3 % — ABNORMAL LOW (ref 17.9–39.5)
TIBC: 526 ug/dL — ABNORMAL HIGH (ref 250–450)
UIBC: 512 ug/dL

## 2017-09-24 LAB — POC OCCULT BLOOD, ED: Fecal Occult Bld: POSITIVE — AB

## 2017-09-24 LAB — RETICULOCYTES
RBC.: 3.95 MIL/uL — ABNORMAL LOW (ref 4.22–5.81)
RETIC COUNT ABSOLUTE: 79 10*3/uL (ref 19.0–186.0)
Retic Ct Pct: 2 % (ref 0.4–3.1)

## 2017-09-24 LAB — VITAMIN B12: Vitamin B-12: 208 pg/mL (ref 180–914)

## 2017-09-24 LAB — FOLATE: Folate: 7.9 ng/mL (ref >5.9)

## 2017-09-24 LAB — FERRITIN: Ferritin: 4 ng/mL — ABNORMAL LOW (ref 24–336)

## 2017-09-24 LAB — BRAIN NATRIURETIC PEPTIDE: B Natriuretic Peptide: 183.9 pg/mL — ABNORMAL HIGH (ref 0.0–100.0)

## 2017-09-24 MED ORDER — ACETAMINOPHEN 325 MG PO TABS: 650.0000 mg | ORAL_TABLET | Freq: Four times a day (QID) | ORAL | Status: DC | PRN

## 2017-09-24 MED ORDER — FAMOTIDINE 20 MG PO TABS
20.0000 mg | ORAL_TABLET | Freq: Every day | ORAL | Status: DC
  Administered 2017-09-25 – 2017-09-26 (×2): 20 mg via ORAL
  Filled 2017-09-24 (×2): qty 1

## 2017-09-24 MED ORDER — IRBESARTAN 300 MG PO TABS
150.0000 mg | ORAL_TABLET | Freq: Every day | ORAL | Status: DC
  Administered 2017-09-25 – 2017-09-26 (×2): 150 mg via ORAL
  Filled 2017-09-24 (×2): qty 1

## 2017-09-24 MED ORDER — VITAMIN D 1000 UNITS PO TABS
2000.0000 [IU] | ORAL_TABLET | Freq: Every day | ORAL | Status: DC
  Administered 2017-09-25 – 2017-09-26 (×2): 2000 [IU] via ORAL
  Filled 2017-09-24 (×2): qty 2

## 2017-09-24 MED ORDER — ZOLPIDEM TARTRATE 5 MG PO TABS: 5.0000 mg | ORAL_TABLET | Freq: Every evening | ORAL | Status: DC | PRN

## 2017-09-24 MED ORDER — ACETAMINOPHEN 650 MG RE SUPP: 650.0000 mg | Freq: Four times a day (QID) | RECTAL | Status: DC | PRN

## 2017-09-24 MED ORDER — AMLODIPINE BESYLATE 10 MG PO TABS
10.0000 mg | ORAL_TABLET | Freq: Every day | ORAL | Status: DC
  Administered 2017-09-25 – 2017-09-26 (×2): 10 mg via ORAL
  Filled 2017-09-24 (×2): qty 1

## 2017-09-24 MED ORDER — VITAMIN B-12 1000 MCG PO TABS
1000.0000 ug | ORAL_TABLET | Freq: Every day | ORAL | Status: DC
  Administered 2017-09-25 – 2017-09-26 (×2): 1000 ug via ORAL
  Filled 2017-09-24 (×2): qty 1

## 2017-09-24 MED ORDER — CARVEDILOL 25 MG PO TABS
25.0000 mg | ORAL_TABLET | Freq: Two times a day (BID) | ORAL | Status: DC
  Administered 2017-09-25 – 2017-09-26 (×3): 25 mg via ORAL
  Filled 2017-09-24 (×3): qty 1

## 2017-09-24 MED ORDER — SODIUM CHLORIDE 0.9 % IV SOLN: 10.0000 mL/h | Freq: Once | INTRAVENOUS | Status: DC

## 2017-09-24 MED ORDER — HYDROCODONE-ACETAMINOPHEN 5-325 MG PO TABS: 1.0000 | ORAL_TABLET | ORAL | Status: DC | PRN

## 2017-09-24 MED ORDER — HYDRALAZINE HCL 50 MG PO TABS
50.0000 mg | ORAL_TABLET | Freq: Three times a day (TID) | ORAL | Status: DC
  Administered 2017-09-25 – 2017-09-26 (×5): 50 mg via ORAL
  Filled 2017-09-24 (×2): qty 1
  Filled 2017-09-24: qty 2
  Filled 2017-09-24 (×2): qty 1

## 2017-09-24 MED ORDER — PRAVASTATIN SODIUM 80 MG PO TABS
80.0000 mg | ORAL_TABLET | Freq: Every day | ORAL | Status: DC
  Administered 2017-09-25: 80 mg via ORAL
  Filled 2017-09-24 (×2): qty 1

## 2017-09-24 MED ORDER — SODIUM CHLORIDE 0.9% FLUSH: 3.0000 mL | INTRAVENOUS | Status: DC | PRN

## 2017-09-24 MED ORDER — ONDANSETRON HCL 4 MG PO TABS: 4.0000 mg | ORAL_TABLET | Freq: Four times a day (QID) | ORAL | Status: DC | PRN

## 2017-09-24 MED ORDER — SODIUM CHLORIDE 0.9 % IV SOLN: 250.0000 mL | INTRAVENOUS | Status: DC | PRN

## 2017-09-24 MED ORDER — PANTOPRAZOLE SODIUM 40 MG PO TBEC
40.0000 mg | DELAYED_RELEASE_TABLET | Freq: Every day | ORAL | Status: DC
  Administered 2017-09-25 – 2017-09-26 (×2): 40 mg via ORAL
  Filled 2017-09-24 (×2): qty 1

## 2017-09-24 MED ORDER — SODIUM CHLORIDE 0.9% FLUSH
3.0000 mL | Freq: Two times a day (BID) | INTRAVENOUS | Status: DC
  Administered 2017-09-25 (×2): 3 mL via INTRAVENOUS

## 2017-09-24 MED ORDER — FUROSEMIDE 40 MG PO TABS
40.0000 mg | ORAL_TABLET | Freq: Every morning | ORAL | Status: DC
  Administered 2017-09-25 – 2017-09-26 (×2): 40 mg via ORAL
  Filled 2017-09-24 (×2): qty 2

## 2017-09-24 MED ORDER — SODIUM CHLORIDE 0.9% FLUSH
3.0000 mL | Freq: Two times a day (BID) | INTRAVENOUS | Status: DC
  Administered 2017-09-25 – 2017-09-26 (×2): 3 mL via INTRAVENOUS

## 2017-09-24 MED ORDER — ONDANSETRON HCL 4 MG/2ML IJ SOLN: 4.0000 mg | Freq: Four times a day (QID) | INTRAMUSCULAR | Status: DC | PRN

## 2017-09-24 MED ORDER — INSULIN ASPART 100 UNIT/ML ~~LOC~~ SOLN
0.0000 [IU] | SUBCUTANEOUS | Status: DC
  Administered 2017-09-25: 2 [IU] via SUBCUTANEOUS

## 2017-09-24 MED ORDER — GUAIFENESIN ER 600 MG PO TB12: 600.0000 mg | ORAL_TABLET | Freq: Two times a day (BID) | ORAL | Status: DC | PRN

## 2017-09-24 NOTE — ED Provider Notes (Signed)
[facility]ENC[facility] DEPARTMENT Provider Note   CSN: [id number] Arrival date & time: 09/24/17  1754     History   Chief Complaint Chief Complaint  Patient presents with  . Shortness of Breath    HPI Harold Pittman is a 77 y.o. male with a PMHx of anemia, gout, arthritis, DM2, HTN, CAD s/p CABG, and other conditions listed below, who presents to the ED with complaints of gradually worsening SOB and lightheadedness with exertion and fatigue which has been gradually getting worse over the last 4 months.  Chart review reveals that he was seen by his PCP yesterday (Dr. Alain Marion at [facility]) for these symptoms, they did labs which were notable for: BNP 305, Hgb 6.6.  He was advised to stop mobic and start lasix 40mg  daily yesterday during his visit; after the results of his labs came in, his PCP advised him to come to the ED for possible new onset CHF and anemia.  He has not yet started the lasix he was prescribed yesterday.  He states that his symptoms initially began about 4 months ago but have progressively worsened, he states that he gets short of breath and lightheaded with any exertion or activity and feels very fatigued.  He has not tried anything for symptoms, no other known aggravating factors aside from exertion/activity, and no alleviating factors reported.  He has never had a blood transfusion before, but chart review reveals that he was not anemic until his admission in April 2017 when his hemoglobin was noted to be in the 8-9 range and since then he has had hemoglobins in that range, is unclear why he has anemia as the patient denies ever being aware of this.  He had a colonoscopy by Dr. Deatra Ina of [facility] on 02/24/08 which showed a few colonic polyps and diverticulosis, he's scheduled for another one in December (he's not sure who's doing that one).  He is not on any blood thinners.  He has no history of CHF, chart review reveals that his last echo was 07/2015 which  showed EF 16-96% and no diastolic dysfunction.  He denies diaphoresis, fevers, chills, cough, CP, LE swelling, recent travel/surgery/immobilization, personal/family hx of DVT/PE, abd pain, N/V/D/C, melena, hematochezia, hematuria, dysuria, myalgias, arthralgias, numbness, tingling, focal weakness, or any other complaints at this time.   The history is provided by the patient and medical records. No language interpreter was used.    Past Medical History:  Diagnosis Date  . Anxiety state, unspecified   . Blood in stool   . Chronic airway obstruction, not elsewhere classified   . Coronary atherosclerosis of artery bypass graft   . ED (erectile dysfunction)   . Esophageal reflux   . Gout, unspecified   . Osteoarthrosis, unspecified whether generalized or localized, unspecified site   . Other and unspecified hyperlipidemia   . Other specified disorder of rectum and anus   . Personal history of tobacco use, presenting hazards to health   . Type II or unspecified type diabetes mellitus without mention of complication, not stated as uncontrolled   . Unspecified essential hypertension     Patient Active Problem List   Diagnosis Date Noted  . Shoulder pain, left 04/26/2017  . Acute hip pain, left 04/10/2017  . Anemia due to other cause 08/17/2015  . Hypokalemia 08/17/2015  . Weakness 07/22/2015  . Generalized weakness 07/22/2015  . Urinary tract infection 07/22/2015  . Gait disorder 03/18/2014  . Low back pain 01/12/2014  . History  of colon polyps 12/17/2013  . DM (diabetes mellitus), type 2 with peripheral vascular complications ([place]) 99/35/7017  . Gastroenteritis 06/16/2013  . Acute upper respiratory infection 03/25/2013  . Diarrhea 10/08/2012  . DOE (dyspnea on exertion) 06/29/2011  . Abscess of axilla, right 12/24/2010  . LATERAL EPICONDYLITIS, LEFT 02/20/2010  . HEMATOCHEZIA 07/02/2009  . PROCTITIS 02/23/2009  . TOBACCO USE, QUIT 02/23/2009  . Cough 02/16/2009  . Impacted  cerumen of left ear 10/15/2008  . DECREASED HEARING 10/15/2008  . ERECTILE DYSFUNCTION 04/30/2008  . 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 HOARSENESS 08/25/2007  . 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 Anxiety state 05/20/2007  . INSOMNIA, PERSISTENT 03/04/2007  . Dyslipidemia 02/01/2007  . Gout 02/01/2007  . Essential hypertension 02/01/2007  . GERD 02/01/2007  . Osteoarthritis 02/01/2007    Past Surgical History:  Procedure Laterality Date  . CORONARY ARTERY BYPASS GRAFT  8   LIMA to LAD, sequential saphenous vein graft to the first and second diagonal, sequential saphenous vein graft to the intermediate OM and circumflex and SVG to RCA  . KNEE SURGERY     BILATERAL  . ROTATOR CUFF REPAIR          Home Medications    Prior to Admission medications   Medication Sig Start Date End Date Taking? Authorizing Provider  amLODipine (NORVASC) 10 MG tablet TAKE 1 TABLET BY MOUTH  DAILY Patient taking differently: TAKE 1 TABLET(10mg ) BY MOUTH  DAILY 07/01/17  Yes Plotnikov, Evie Lacks, MD  aspirin 81 MG EC tablet Take 81 mg by mouth daily.     Yes [provider]  carvedilol (COREG) 25 MG tablet TAKE 1 TABLET BY MOUTH TWO  TIMES DAILY WITH MEALS Patient taking differently: TAKE 1 TABLET(25mg ) BY MOUTH TWO  TIMES DAILY WITH MEALS 07/01/17  Yes Plotnikov, Evie Lacks, MD  Cholecalciferol 1000 UNITS tablet Take 2,000 Units by mouth daily.     Yes [provider]  colchicine 0.6 MG tablet Take 1 tablet (0.6 mg total) by mouth 2 (two) times daily as needed. Patient taking differently: Take 0.6 mg by mouth 2 (two) times daily as needed (For gout).  09/23/17  Yes Plotnikov, Evie Lacks, MD  Cyanocobalamin (VITAMIN B-12) 1000 MCG SUBL Place 1 tablet (1,000 mcg total) under the tongue daily. 08/17/15  Yes Plotnikov, Evie Lacks, MD  docusate sodium (COLACE) 100 MG capsule Take 1 capsule (100 mg total) by mouth 2 (two) times daily. 07/26/15  Yes Donne Hazel, MD  ferrous  sulfate 325 (65 FE) MG tablet Take 1 tablet (325 mg total) by mouth daily. 01/31/16 09/24/17 Yes Plotnikov, Evie Lacks, MD  furosemide (LASIX) 20 MG tablet Take 2 tablets (40 mg total) by mouth every morning. 09/23/17  Yes Plotnikov, Evie Lacks, MD  guaiFENesin (MUCINEX) 600 MG 12 hr tablet Take 1 tablet (600 mg total) by mouth 2 (two) times daily as needed for cough or to loosen phlegm. 06/12/16  Yes Nche, Charlene Brooke, NP  hydrALAZINE (APRESOLINE) 50 MG tablet TAKE 1 TABLET BY MOUTH 3  TIMES DAILY Patient taking differently: TAKE 1 TABLET(50mg ) BY MOUTH 3  TIMES DAILY 07/01/17  Yes Plotnikov, Evie Lacks, MD  irbesartan (AVAPRO) 150 MG tablet Take 1 tablet (150 mg total) by mouth daily. 12/31/16  Yes Plotnikov, Evie Lacks, MD  loperamide (IMODIUM) 2 MG capsule Take 1 capsule by mouth 4  times daily as needed for  diarrhea or loose stools 01/17/15  Yes Plotnikov, Evie Lacks, MD  metFORMIN (GLUCOPHAGE  XR) 750 MG 24 hr tablet Take 1 tablet (750 mg total) by mouth daily with breakfast. 12/31/16  Yes Plotnikov, Evie Lacks, MD  omeprazole (PRILOSEC) 20 MG capsule TAKE 1 CAPSULE BY MOUTH  DAILY 07/01/17  Yes Plotnikov, Evie Lacks, MD  pravastatin (PRAVACHOL) 80 MG tablet TAKE 1 TABLET BY MOUTH AT  BEDTIME 07/01/17  Yes Plotnikov, Evie Lacks, MD  ranitidine (ZANTAC) 300 MG tablet TAKE 1 TABLET BY MOUTH  EVERY EVENING 07/01/17  Yes Plotnikov, Evie Lacks, MD  tadalafil (CIALIS) 20 MG tablet Take as directed Patient taking differently: Take 20 mg by mouth daily as needed for erectile dysfunction.  09/26/10  Yes Noralee Space, MD  zolpidem (AMBIEN) 5 MG tablet Take 1 tablet (5 mg total) by mouth at bedtime as needed for sleep. Patient taking differently: Take 5 mg by mouth at bedtime.  03/25/13  Yes Biagio Borg, MD  albuterol [facility] HFA) 108 (90 BASE) MCG/ACT inhaler Inhale 2 puffs into the lungs 2 (two) times daily. Patient not taking: Reported on 09/24/2017 09/26/10   Noralee Space, MD  beclomethasone (QVAR) 80 MCG/ACT  inhaler Inhale 2 puffs into the lungs 2 (two) times daily. Patient not taking: Reported on 09/24/2017 09/26/10   Noralee Space, MD  ipratropium (ATROVENT) 0.03 % nasal spray Place 2 sprays into both nostrils 2 (two) times daily. Do not use for more than 5days. Patient not taking: Reported on 09/24/2017 06/12/16   Nche, Charlene Brooke, NP  oxyCODONE (OXY IR/ROXICODONE) 5 MG immediate release tablet Take 1 tablet (5 mg total) by mouth every 6 (six) hours as needed for severe pain. Patient not taking: Reported on 09/24/2017 05/06/17   Plotnikov, Evie Lacks, MD    Family History Family History  Problem Relation Age of Onset  . Depression Sister   . Heart disease Sister   . Heart disease Mother 55       CAD  . Mental illness Father        Alzheimer's  . Hypertension Other   . Coronary artery disease Other        Male 1st degree relative <50  . Colon cancer Neg Hx     Social History Social History   Tobacco Use  . Smoking status: Former Research scientist (life sciences)  . Smokeless tobacco: Never Used  . Tobacco comment: Stopped 1997  Substance Use Topics  . Alcohol use: No    Comment: Stopped 1997  . Drug use: No     Allergies   Benazepril   Review of Systems Review of Systems  Constitutional: Positive for fatigue. Negative for chills, diaphoresis and fever.  Respiratory: Positive for shortness of breath (with exertion). Negative for cough.   Cardiovascular: Negative for chest pain and leg swelling.  Gastrointestinal: Negative for abdominal pain, anal bleeding, blood in stool, constipation, diarrhea, nausea and vomiting.  Genitourinary: Negative for dysuria and hematuria.  Musculoskeletal: Negative for arthralgias and myalgias.  Skin: Negative for color change.  Allergic/Immunologic: Positive for immunocompromised state (DM2).  Neurological: Positive for light-headedness. Negative for weakness and numbness.  Hematological: Does not bruise/bleed easily.  Psychiatric/Behavioral: Negative for confusion.     All other systems reviewed and are negative for acute change except as noted in the HPI.    Physical Exam Updated Vital Signs BP 136/72   Pulse 68   Temp 98.7 F (37.1 C) (Oral)   Resp 16   SpO2 97%   Physical Exam  Constitutional: He is oriented to person, place, and time. Vital  signs are normal. He appears well-developed and well-nourished.  Non-toxic appearance. No distress.  Afebrile, nontoxic, NAD  HENT:  Head: Normocephalic and atraumatic.  Mouth/Throat: Oropharynx is clear and moist and mucous membranes are normal.  Eyes: Conjunctivae and EOM are normal. Right eye exhibits no discharge. Left eye exhibits no discharge.  Mild conjunctival pallor  Neck: Normal range of motion. Neck supple.  Cardiovascular: Normal rate, regular rhythm, normal heart sounds and intact distal pulses. Exam reveals no gallop and no friction rub.  No murmur heard. RRR, nl s1/s2, no m/r/g, distal pulses intact, very trace b/l pedal edema around the ankles  Pulmonary/Chest: Effort normal and breath sounds normal. No respiratory distress. He has no decreased breath sounds. He has no wheezes. He has no rhonchi. He has no rales.  CTAB in all lung fields, no w/r/r, no hypoxia or increased WOB, speaking in full sentences, SpO2 99% on RA   Abdominal: Soft. Normal appearance and bowel sounds are normal. He exhibits no distension. There is no tenderness. There is no rigidity, no rebound, no guarding, no CVA tenderness, no tenderness at McBurney's point and negative Murphy's sign.  Soft, NTND, +BS throughout, no r/g/r, neg murphy's, neg mcburney's, no CVA TTP   Genitourinary: Prostate normal. Rectal exam shows guaiac positive stool. Rectal exam shows no external hemorrhoid, no internal hemorrhoid, no fissure, no mass, no tenderness and anal tone normal.  Genitourinary Comments: Chaperone present No gross blood noted on rectal exam, normal tone, no tenderness, no mass or fissure, no hemorrhoids. No melanotic stool.  FOBT+ Prostate without enlargement, warmth, tenderness, or bogginess.   Musculoskeletal: Normal range of motion.  MAE x4 Strength and sensation grossly intact in all extremities Distal pulses intact Very trace b/l pedal edema around the ankles, neg homan's bilaterally  Neurological: He is alert and oriented to person, place, and time. He has normal strength. No sensory deficit.  Skin: Skin is warm, dry and intact. No rash noted.  Psychiatric: He has a normal mood and affect.  Nursing note and vitals reviewed.    ED Treatments / Results  Labs (all labs ordered are listed, but only abnormal results are displayed) Labs Reviewed  BASIC METABOLIC PANEL - Abnormal; Notable for the following components:      Result Value   Glucose, Bld 129 (*)    Creatinine, Ser 1.40 (*)    GFR calc non Af Amer 47 (*)    GFR calc Af Amer 55 (*)    All other components within normal limits  CBC - Abnormal; Notable for the following components:   RBC 3.95 (*)    Hemoglobin 6.8 (*)    HCT 26.1 (*)    MCV 66.1 (*)    MCH 17.2 (*)    MCHC 26.1 (*)    RDW 19.1 (*)    All other components within normal limits  BRAIN NATRIURETIC PEPTIDE - Abnormal; Notable for the following components:   B Natriuretic Peptide 183.9 (*)    All other components within normal limits  POC OCCULT BLOOD, ED - Abnormal; Notable for the following components:   Fecal Occult Bld POSITIVE (*)    All other components within normal limits  VITAMIN B12  FOLATE  IRON AND TIBC  FERRITIN  RETICULOCYTES  I-STAT TROPONIN, ED  PREPARE RBC (CROSSMATCH)  TYPE AND SCREEN    EKG EKG Interpretation  Date/Time:  Tuesday September 24 2017 21:22:23 EDT Ventricular Rate:  82 PR Interval:    QRS Duration: 93 QT Interval:  401 QTC Calculation: 469 R Axis:   40 Text Interpretation:  Sinus rhythm Borderline prolonged PR interval Abnormal R-wave progression, early transition Borderline T wave abnormalities Confirmed by Quintella Reichert [phone])  on 09/24/2017 9:32:03 PM   Radiology Dg Chest 2 View  Result Date: 09/24/2017 CLINICAL DATA:  Shortness of breath beginning today. EXAM: CHEST - 2 VIEW COMPARISON:  09/23/2017 FINDINGS: Sternotomy wires unchanged. Lungs are adequately inflated without airspace consolidation or effusion. Cardiomediastinal silhouette and remainder of the exam is unchanged. IMPRESSION: No active cardiopulmonary disease. Electronically Signed   By: Marin Olp M.D.   On: 09/24/2017 19:24   Dg Chest 2 View  Result Date: 09/23/2017 CLINICAL DATA:  Shortness of breath with exertion EXAM: CHEST - 2 VIEW COMPARISON:  December 31, 2016 FINDINGS: There is slight atelectasis in the right base. The lungs elsewhere are clear. Heart is upper normal in size with pulmonary vascularity normal. No adenopathy. Patient is status post coronary artery bypass grafting. There is aortic atherosclerosis. No evident bone lesions. IMPRESSION: Slight right base atelectasis. No edema or consolidation. Stable cardiac silhouette. There is aortic atherosclerosis. Aortic Atherosclerosis (ICD10-I70.0). Electronically Signed   By: Lowella Grip III M.D.   On: 09/23/2017 13:11    Echo 07/23/15: Study Conclusions - Left ventricle: The cavity size was normal. There was mild   concentric hypertrophy. Systolic function was normal. The   estimated ejection fraction was in the range of 60% to 65%.   Probable akinesis of the basalinferior myocardium. Left   ventricular diastolic function parameters were normal. - Aortic valve: Trileaflet; mildly thickened, mildly calcified   leaflets. - Mitral valve: There was mild regurgitation. - Pulmonary arteries: PA peak pressure: 45 mm Hg (S). Impressions: - The right ventricular systolic pressure was increased consistent   with moderate pulmonary hypertension.   Procedures Procedures (including critical care time)  CRITICAL CARE-symptomatic anemia requiring transfusion Performed by: Reece Agar   Total critical care time: 45 minutes  Critical care time was exclusive of separately billable procedures and treating other patients.  Critical care was necessary to treat or prevent imminent or life-threatening deterioration.  Critical care was time spent personally by me on the following activities: development of treatment plan with patient and/or surrogate as well as nursing, discussions with consultants, evaluation of patient's response to treatment, examination of patient, obtaining history from patient or surrogate, ordering and performing treatments and interventions, ordering and review of laboratory studies, ordering and review of radiographic studies, pulse oximetry and re-evaluation of patient's condition.   Medications Ordered in ED Medications  0.9 %  sodium chloride infusion (has no administration in time range)     Initial Impression / Assessment and Plan / ED Course  I have reviewed the triage vital signs and the nursing notes.  Pertinent labs & imaging results that were available during my care of the patient were reviewed by me and considered in my medical decision making (see chart for details).     77 y.o. male here with worsening SOB/lightheadedness with exertion and fatigue for several months. Went to PCP yesterday, labs showed BNP 305 and Hgb 6.6, advised to come here for further eval. On exam, mild conjunctival pallor, vitals stable, clear lungs, no tachycardia or hypoxia, very trace b/l pedal edema in the ankle area, doesn't appear fluid overloaded; abdomen soft and nontender; rectal exam without obvious hemorrhoids or bloody/melanotic stools. Work up thus far reveals: BNP 183.9, trop negative, BMP with mildly elevated Cr 1.4 which is close to  his prior baseline, CBC with Hgb 6.8/Hct 26.1/MCV 66.1 which is lower than prior values (looking back, up until 07/2015 he had no anemia, but since 07/2015 his Hgb has been in the 8-9 range, unclear etiology for  anemia, doesn't look like any interventions were ever done). CXR unremarkable and without pulmonary edema or cardiomegaly. EKG not yet done, will have this done ASAP. Symptoms consistent with symptomatic anemia, unclear etiology of his anemia; FOBT sent; last colonoscopy was 02/24/08 which showed diverticulosis and a few colonic polyps. R/B/A of transfusion discussed and pt wishes to proceed. Will add on anemia panel, type and screen, and prepare for transfusion and admission. Discussed case with my attending Dr. Ralene Bathe who agrees with plan.   10:10 PM FOBT+. EKG without acute ischemic findings, fairly similar to his prior EKG in 08/2015. Dr. Myna Hidalgo of [facility] returning page and will admit. Holding orders to be placed by admitting team. Please see their notes for further documentation of care. I appreciate their help with this pleasant pt's care. Pt stable at time of admission.    Final Clinical Impressions(s) / ED Diagnoses   Final diagnoses:  Symptomatic anemia  SOB (shortness of breath) on exertion  Episodic lightheadedness  Fatigue, unspecified type  Positive occult stool blood test    ED Discharge Orders    [address] [place], [place] 09/24/17 2210    Quintella Reichert, MD 09/25/17 [phone]

## 2017-09-24 NOTE — H&P (Signed)
History and Physical    Harold Pittman NOB:[id number] DOB: [birth year] DOA: 09/24/2017  PCP: Cassandria Anger, MD   Patient coming from: Home  Chief Complaint: Fatigue, DOE, low Hgb   HPI: Harold Pittman is a 77 y.o. male with medical history significant for coronary artery disease, type 2 diabetes mellitus, hypertension, chronic diastolic CHF, and chronic anemia, now presenting to the emergency department at the direction of his PCP for evaluation of exertional dyspnea, fatigue, and low hemoglobin on outpatient blood work.  Patient reports the insidious development of progressive exertional dyspnea and fatigue over the past 4 months without cough, chest pain, fevers, or chills.  He has noted slight swelling in the bilateral lower extremities, but no lower extremity tenderness.  He denies melena or hematochezia, denies alcohol use, and denies using any NSAID.  Last colonoscopy was 10 years ago.  He takes a baby aspirin and PPI daily.  ED Course: Upon arrival to the ED, patient is found to be afebrile, saturating well on room air, and with vitals otherwise normal.  EKG features a sinus rhythm and chest x-ray is negative for acute cardiopulmonary disease.  Chemistry panel is notable for a creatinine 1.40, similar to priors.  CBC features a hemoglobin of 6.8 with MCV of 66.1.  Fecal occult blood testing is positive.  Anemia panel was sent from the ED and 2 units of packed red blood cells were ordered for immediate transfusion.  Patient remains hemodynamically stable and will be observed for ongoing evaluation and management of symptomatic anemia.  Review of Systems:  All other systems reviewed and apart from HPI, are negative.  Past Medical History:  Diagnosis Date  . Anxiety state, unspecified   . Blood in stool   . Chronic airway obstruction, not elsewhere classified   . Coronary atherosclerosis of artery bypass graft   . ED (erectile dysfunction)   . Esophageal reflux   . Gout,  unspecified   . Osteoarthrosis, unspecified whether generalized or localized, unspecified site   . Other and unspecified hyperlipidemia   . Other specified disorder of rectum and anus   . Personal history of tobacco use, presenting hazards to health   . Type II or unspecified type diabetes mellitus without mention of complication, not stated as uncontrolled   . Unspecified essential hypertension     Past Surgical History:  Procedure Laterality Date  . CORONARY ARTERY BYPASS GRAFT  53   LIMA to LAD, sequential saphenous vein graft to the first and second diagonal, sequential saphenous vein graft to the intermediate OM and circumflex and SVG to RCA  . KNEE SURGERY     BILATERAL  . ROTATOR CUFF REPAIR       reports that he has quit smoking. He has never used smokeless tobacco. He reports that he does not drink alcohol or use drugs.  Allergies  Allergen Reactions  . Benazepril Shortness Of Breath    Cough, wheezing    Family History  Problem Relation Age of Onset  . Depression Sister   . Heart disease Sister   . Heart disease Mother 79       CAD  . Mental illness Father        Alzheimer's  . Hypertension Other   . Coronary artery disease Other        Male 1st degree relative <50  . Colon cancer Neg Hx      Prior to Admission medications   Medication Sig Start Date End Date Taking? Authorizing  Provider  amLODipine (NORVASC) 10 MG tablet TAKE 1 TABLET BY MOUTH  DAILY Patient taking differently: TAKE 1 TABLET(10mg ) BY MOUTH  DAILY 07/01/17  Yes Plotnikov, Evie Lacks, MD  aspirin 81 MG EC tablet Take 81 mg by mouth daily.     Yes [provider]  carvedilol (COREG) 25 MG tablet TAKE 1 TABLET BY MOUTH TWO  TIMES DAILY WITH MEALS Patient taking differently: TAKE 1 TABLET(25mg ) BY MOUTH TWO  TIMES DAILY WITH MEALS 07/01/17  Yes Plotnikov, Evie Lacks, MD  Cholecalciferol 1000 UNITS tablet Take 2,000 Units by mouth daily.     Yes [provider]  colchicine 0.6 MG  tablet Take 1 tablet (0.6 mg total) by mouth 2 (two) times daily as needed. Patient taking differently: Take 0.6 mg by mouth 2 (two) times daily as needed (For gout).  09/23/17  Yes Plotnikov, Evie Lacks, MD  Cyanocobalamin (VITAMIN B-12) 1000 MCG SUBL Place 1 tablet (1,000 mcg total) under the tongue daily. 08/17/15  Yes Plotnikov, Evie Lacks, MD  docusate sodium (COLACE) 100 MG capsule Take 1 capsule (100 mg total) by mouth 2 (two) times daily. 07/26/15  Yes Donne Hazel, MD  ferrous sulfate 325 (65 FE) MG tablet Take 1 tablet (325 mg total) by mouth daily. 01/31/16 09/24/17 Yes Plotnikov, Evie Lacks, MD  furosemide (LASIX) 20 MG tablet Take 2 tablets (40 mg total) by mouth every morning. 09/23/17  Yes Plotnikov, Evie Lacks, MD  guaiFENesin (MUCINEX) 600 MG 12 hr tablet Take 1 tablet (600 mg total) by mouth 2 (two) times daily as needed for cough or to loosen phlegm. 06/12/16  Yes Nche, Charlene Brooke, NP  hydrALAZINE (APRESOLINE) 50 MG tablet TAKE 1 TABLET BY MOUTH 3  TIMES DAILY Patient taking differently: TAKE 1 TABLET(50mg ) BY MOUTH 3  TIMES DAILY 07/01/17  Yes Plotnikov, Evie Lacks, MD  irbesartan (AVAPRO) 150 MG tablet Take 1 tablet (150 mg total) by mouth daily. 12/31/16  Yes Plotnikov, Evie Lacks, MD  loperamide (IMODIUM) 2 MG capsule Take 1 capsule by mouth 4  times daily as needed for  diarrhea or loose stools 01/17/15  Yes Plotnikov, Evie Lacks, MD  metFORMIN (GLUCOPHAGE XR) 750 MG 24 hr tablet Take 1 tablet (750 mg total) by mouth daily with breakfast. 12/31/16  Yes Plotnikov, Evie Lacks, MD  omeprazole (PRILOSEC) 20 MG capsule TAKE 1 CAPSULE BY MOUTH  DAILY 07/01/17  Yes Plotnikov, Evie Lacks, MD  pravastatin (PRAVACHOL) 80 MG tablet TAKE 1 TABLET BY MOUTH AT  BEDTIME 07/01/17  Yes Plotnikov, Evie Lacks, MD  ranitidine (ZANTAC) 300 MG tablet TAKE 1 TABLET BY MOUTH  EVERY EVENING 07/01/17  Yes Plotnikov, Evie Lacks, MD  tadalafil (CIALIS) 20 MG tablet Take as directed Patient taking differently: Take 20 mg  by mouth daily as needed for erectile dysfunction.  09/26/10  Yes Noralee Space, MD  zolpidem (AMBIEN) 5 MG tablet Take 1 tablet (5 mg total) by mouth at bedtime as needed for sleep. Patient taking differently: Take 5 mg by mouth at bedtime.  03/25/13  Yes Biagio Borg, MD  albuterol [facility] HFA) 108 (90 BASE) MCG/ACT inhaler Inhale 2 puffs into the lungs 2 (two) times daily. Patient not taking: Reported on 09/24/2017 09/26/10   Noralee Space, MD  beclomethasone (QVAR) 80 MCG/ACT inhaler Inhale 2 puffs into the lungs 2 (two) times daily. Patient not taking: Reported on 09/24/2017 09/26/10   Noralee Space, MD  ipratropium (ATROVENT) 0.03 % nasal spray Place 2 sprays  into both nostrils 2 (two) times daily. Do not use for more than 5days. Patient not taking: Reported on 09/24/2017 06/12/16   Flossie Buffy, NP    Physical Exam: Vitals:   09/24/17 1812 09/24/17 1954 09/24/17 2100  BP: (!) 147/53 136/72 (!) 156/78  Pulse: 73 68 67  Resp: 16 16 17   Temp: 98.7 F (37.1 C)    TempSrc: Oral    SpO2: 97% 97% 99%      Constitutional: NAD, calm  Eyes: PERTLA, lids and conjunctivae normal ENMT: Mucous membranes are moist. Posterior pharynx clear of any exudate or lesions.   Neck: normal, supple, no masses, no thyromegaly Respiratory: clear to auscultation bilaterally, no wheezing, no crackles. Normal respiratory effort.   Cardiovascular: S1 & S2 heard, regular rate and rhythm. 1+ pretibial edema bilaterally. No significant JVD. Abdomen: No distension, no tenderness, soft. Bowel sounds active.  Musculoskeletal: no clubbing / cyanosis. No joint deformity upper and lower extremities.   Skin: no significant rashes, lesions, ulcers. Warm, dry, well-perfused. Neurologic: CN 2-12 grossly intact. Sensation intact. Strength 5/5 in all 4 limbs.  Psychiatric: Alert and oriented x 3. Pleasant, cooperative.     Labs on Admission: I have personally reviewed following labs and imaging  studies  CBC: Recent Labs  Lab 09/23/17 1114 09/24/17 1801  WBC 8.1 6.8  NEUTROABS 5.6  --   HGB 6.6 Repeated and verified X2.* 6.8*  HCT 21.9 Repeated and verified X2.* 26.1*  MCV 59.3 Repeated and verified X2.* 66.1*  PLT 246.0 161   Basic Metabolic Panel: Recent Labs  Lab 09/23/17 1114 09/24/17 1801  NA 144 144  K 4.0 4.2  CL 108 109  CO2 27 25  GLUCOSE 182* 129*  BUN 19 15  CREATININE 1.37 1.40*  CALCIUM 9.6 9.5   GFR: Estimated Creatinine Clearance: 50.7 mL/min (A) (by C-G formula based on SCr of 1.4 mg/dL (H)). Liver Function Tests: No results for input(s): AST, ALT, ALKPHOS, BILITOT, PROT, ALBUMIN in the last 168 hours. No results for input(s): LIPASE, AMYLASE in the last 168 hours. No results for input(s): AMMONIA in the last 168 hours. Coagulation Profile: No results for input(s): INR, PROTIME in the last 168 hours. Cardiac Enzymes: No results for input(s): CKTOTAL, CKMB, CKMBINDEX, TROPONINI in the last 168 hours. BNP (last 3 results) Recent Labs    09/23/17 1114  PROBNP 305.0*   HbA1C: No results for input(s): HGBA1C in the last 72 hours. CBG: No results for input(s): GLUCAP in the last 168 hours. Lipid Profile: No results for input(s): CHOL, HDL, LDLCALC, TRIG, CHOLHDL, LDLDIRECT in the last 72 hours. Thyroid Function Tests: No results for input(s): TSH, T4TOTAL, FREET4, T3FREE, THYROIDAB in the last 72 hours. Anemia Panel: Recent Labs    09/24/17 2114  VITAMINB12 208  FOLATE 7.9  FERRITIN 4*  TIBC 526*  IRON 14*  RETICCTPCT 2.0   Urine analysis:    Component Value Date/Time   COLORURINE YELLOW 01/30/2016 0848   APPEARANCEUR CLEAR 01/30/2016 0848   LABSPEC 1.020 01/30/2016 0848   PHURINE 6.0 01/30/2016 0848   GLUCOSEU NEGATIVE 01/30/2016 0848   HGBUR NEGATIVE 01/30/2016 0848   BILIRUBINUR NEGATIVE 01/30/2016 0848   KETONESUR NEGATIVE 01/30/2016 0848   PROTEINUR 100 (A) 08/13/2015 1040   UROBILINOGEN 0.2 01/30/2016 0848   NITRITE  NEGATIVE 01/30/2016 0848   LEUKOCYTESUR NEGATIVE 01/30/2016 0848   Sepsis Labs: @LABRCNTIP (procalcitonin:4,lacticidven:4) )No results found for this or any previous visit (from the past 240 hour(s)).   Radiological Exams  on Admission: Dg Chest 2 View  Result Date: 09/24/2017 CLINICAL DATA:  Shortness of breath beginning today. EXAM: CHEST - 2 VIEW COMPARISON:  09/23/2017 FINDINGS: Sternotomy wires unchanged. Lungs are adequately inflated without airspace consolidation or effusion. Cardiomediastinal silhouette and remainder of the exam is unchanged. IMPRESSION: No active cardiopulmonary disease. Electronically Signed   By: Marin Olp M.D.   On: 09/24/2017 19:24   Dg Chest 2 View  Result Date: 09/23/2017 CLINICAL DATA:  Shortness of breath with exertion EXAM: CHEST - 2 VIEW COMPARISON:  December 31, 2016 FINDINGS: There is slight atelectasis in the right base. The lungs elsewhere are clear. Heart is upper normal in size with pulmonary vascularity normal. No adenopathy. Patient is status post coronary artery bypass grafting. There is aortic atherosclerosis. No evident bone lesions. IMPRESSION: Slight right base atelectasis. No edema or consolidation. Stable cardiac silhouette. There is aortic atherosclerosis. Aortic Atherosclerosis (ICD10-I70.0). Electronically Signed   By: Lowella Grip III M.D.   On: 09/23/2017 13:11    EKG: Independently reviewed. Sinus rhythm.   Assessment/Plan   1. Symptomatic anemia; occult GI bleeding  - Presents with progressive DOE and fatigue over the past few months, found to have Hgb of 6.8 and FOBT positive    - He is hemodynamically stable, denies melena or hematochezia, denies abdominal pain  - Hgb had been in 8-9 range previously  - Anemia panel sent from ED and 2 units pRBCs transfusing  - Keep NPO for now, check post-transfusion CBC    2. Chronic diastolic CHF  - Appears well-compensated  - Continue Lasix, beta-blocker, ARB, daily wts    3. CKD  stage III  - SCr is 1.40 on admission, similar to priors  - Renally-dose medications, avoid nephrotoxins    4. Type II DM  - A1c was 6.7% in April   - Managed at home with metformin only, held on admission   - Check CBG's and use a low-intensity SSI with Novolog while in hospital    5. CAD  - No anginal complaints  - Continue beta-blocker, ARB, statin  - ASA held in light of GIB    6. Hypertension  - BP at goal   - Continue Norvasc, Coreg, hydralazine, and irbesartan as tolerated     DVT prophylaxis: SCDs Code Status: Full  Family Communication: Discussed with patient Consults called: None Admission status: Observation    Vianne Bulls, MD [facility] Pager [phone]  If 7PM-7AM, please contact night-coverage [email or web address] Password [facility]  09/24/2017, 11:23 PM

## 2017-09-24 NOTE — ED Triage Notes (Addendum)
Patient told to go to ED by PCP after having blood work done yesterday, showing possible new CHF. Patient denies hx of same but does have cardiac history (bypass in 1997). States he went to his doctor for progressively worsening SOB (worse with slight exertion) over the past couple of months. Patient denies CP, no dizziness or lightheadedness, no cough or fevers/chills. Resp e/u, skin warm/dry.

## 2017-09-24 NOTE — ED Notes (Signed)
Lab called with critical Hgb 6.8

## 2017-09-24 NOTE — ED Provider Notes (Signed)
Patient placed in Quick Look pathway, seen and evaluated   Chief Complaint: SOB for months  HPI:   Patient presents for SOB for the past several months. Worse with any type of movement and walking. Seen by PCP yesterday and sent for further workup for what appears to be new onset CHF due to elevated BNP. Denies chest pain, hemoptysis, cough, fever. Had cardiac bypass done in 1997 and is followed by cardiology regularly. Denies leg swelling.  ROS: SOB  Physical Exam:   Gen: No distress  Neuro: Awake and Alert  Skin: Warm    Focused Exam: Lungs CTAB. RRR. No lower extremity edema noted.  Echo done in 2017 shows EF 60-65%. Labs reviewed from yesterday's visit. BNP is 305. Hemoglobin low at 6.6. Patient was not informed of this. States he has a history of anemia. Denies hemoptysis or blood in stool. Denies any bleeding.  Will redraw labs today.  Initiation of care has begun. The patient has been counseled on the process, plan, and necessity for staying for the completion/evaluation, and the remainder of the medical screening examination    Delia Heady, PA-C 09/24/17 1833    Macarthur Critchley, MD 09/24/17 [phone]

## 2017-09-24 NOTE — Telephone Encounter (Signed)
This encounter was created in error - please disregard.

## 2017-09-25 ENCOUNTER — Encounter (HOSPITAL_COMMUNITY): Payer: Self-pay | Admitting: *Deleted

## 2017-09-25 ENCOUNTER — Other Ambulatory Visit: Payer: Self-pay

## 2017-09-25 DIAGNOSIS — D649 Anemia, unspecified: Secondary | ICD-10-CM

## 2017-09-25 DIAGNOSIS — D509 Iron deficiency anemia, unspecified: Principal | ICD-10-CM

## 2017-09-25 DIAGNOSIS — K3189 Other diseases of stomach and duodenum: Secondary | ICD-10-CM | POA: Diagnosis not present

## 2017-09-25 DIAGNOSIS — K640 First degree hemorrhoids: Secondary | ICD-10-CM | POA: Diagnosis not present

## 2017-09-25 DIAGNOSIS — I5032 Chronic diastolic (congestive) heart failure: Secondary | ICD-10-CM | POA: Diagnosis not present

## 2017-09-25 DIAGNOSIS — E1151 Type 2 diabetes mellitus with diabetic peripheral angiopathy without gangrene: Secondary | ICD-10-CM

## 2017-09-25 DIAGNOSIS — I10 Essential (primary) hypertension: Secondary | ICD-10-CM

## 2017-09-25 DIAGNOSIS — K317 Polyp of stomach and duodenum: Secondary | ICD-10-CM | POA: Diagnosis not present

## 2017-09-25 DIAGNOSIS — D120 Benign neoplasm of cecum: Secondary | ICD-10-CM | POA: Diagnosis not present

## 2017-09-25 DIAGNOSIS — N183 Chronic kidney disease, stage 3 (moderate): Secondary | ICD-10-CM | POA: Diagnosis not present

## 2017-09-25 DIAGNOSIS — R195 Other fecal abnormalities: Secondary | ICD-10-CM | POA: Diagnosis not present

## 2017-09-25 DIAGNOSIS — D125 Benign neoplasm of sigmoid colon: Secondary | ICD-10-CM | POA: Diagnosis not present

## 2017-09-25 DIAGNOSIS — I25810 Atherosclerosis of coronary artery bypass graft(s) without angina pectoris: Secondary | ICD-10-CM | POA: Diagnosis not present

## 2017-09-25 DIAGNOSIS — K5730 Diverticulosis of large intestine without perforation or abscess without bleeding: Secondary | ICD-10-CM | POA: Diagnosis not present

## 2017-09-25 DIAGNOSIS — K2970 Gastritis, unspecified, without bleeding: Secondary | ICD-10-CM | POA: Diagnosis not present

## 2017-09-25 DIAGNOSIS — D123 Benign neoplasm of transverse colon: Secondary | ICD-10-CM | POA: Diagnosis not present

## 2017-09-25 LAB — GLUCOSE, CAPILLARY
GLUCOSE-CAPILLARY: 113 mg/dL — AB (ref 65–99)
GLUCOSE-CAPILLARY: 114 mg/dL — AB (ref 65–99)
GLUCOSE-CAPILLARY: 158 mg/dL — AB (ref 65–99)
Glucose-Capillary: 120 mg/dL — ABNORMAL HIGH (ref 65–99)
Glucose-Capillary: 93 mg/dL (ref 65–99)
Glucose-Capillary: 99 mg/dL (ref 65–99)

## 2017-09-25 LAB — CBC
HCT: 30 % — ABNORMAL LOW (ref 39.0–52.0)
HEMOGLOBIN: 8.6 g/dL — AB (ref 13.0–17.0)
MCH: 20 pg — ABNORMAL LOW (ref 26.0–34.0)
MCHC: 28.7 g/dL — AB (ref 30.0–36.0)
MCV: 69.9 fL — ABNORMAL LOW (ref 78.0–100.0)
Platelets: 233 10*3/uL (ref 150–400)
RBC: 4.29 MIL/uL (ref 4.22–5.81)
RDW: 24.4 % — ABNORMAL HIGH (ref 11.5–15.5)
WBC: 7.6 10*3/uL (ref 4.0–10.5)

## 2017-09-25 LAB — BASIC METABOLIC PANEL
ANION GAP: 8 (ref 5–15)
BUN: 12 mg/dL (ref 6–20)
CO2: 26 mmol/L (ref 22–32)
Calcium: 9.1 mg/dL (ref 8.9–10.3)
Chloride: 110 mmol/L (ref 101–111)
Creatinine, Ser: 1.27 mg/dL — ABNORMAL HIGH (ref 0.61–1.24)
GFR, EST NON AFRICAN AMERICAN: 53 mL/min — AB (ref >60)
GLUCOSE: 122 mg/dL — AB (ref 65–99)
Potassium: 3.7 mmol/L (ref 3.5–5.1)
Sodium: 144 mmol/L (ref 135–145)

## 2017-09-25 MED ORDER — METOCLOPRAMIDE HCL 10 MG PO TABS
10.0000 mg | ORAL_TABLET | Freq: Once | ORAL | Status: AC
Start: 2017-09-25 — End: 2017-09-25
  Administered 2017-09-25: 10 mg via ORAL
  Filled 2017-09-25: qty 1

## 2017-09-25 MED ORDER — PEG-KCL-NACL-NASULF-NA ASC-C 100 G PO SOLR
0.5000 | Freq: Once | ORAL | Status: AC
  Administered 2017-09-25: 0.5 via ORAL
  Filled 2017-09-25 (×2): qty 1

## 2017-09-25 MED ORDER — SODIUM CHLORIDE 0.9 % IV SOLN
510.0000 mg | Freq: Once | INTRAVENOUS | Status: AC
  Administered 2017-09-25: 510 mg via INTRAVENOUS
  Filled 2017-09-25: qty 17

## 2017-09-25 MED ORDER — PEG-KCL-NACL-NASULF-NA ASC-C 100 G PO SOLR: 1.0000 | Freq: Once | ORAL | Status: DC

## 2017-09-25 MED ORDER — METOCLOPRAMIDE HCL 10 MG PO TABS
10.0000 mg | ORAL_TABLET | Freq: Once | ORAL | Status: AC
  Administered 2017-09-26: 10 mg via ORAL
  Filled 2017-09-25: qty 1

## 2017-09-25 MED ORDER — PEG-KCL-NACL-NASULF-NA ASC-C 100 G PO SOLR
0.5000 | Freq: Once | ORAL | Status: AC
  Administered 2017-09-26: 100 g via ORAL

## 2017-09-25 NOTE — Progress Notes (Signed)
Blood transfusions complete. AM labs scheduled for 0800. Will continue to monitor.

## 2017-09-25 NOTE — Progress Notes (Signed)
PROGRESS NOTE    Harold Pittman  MOQ:[id number] DOB: Sep 16, 1940 DOA: 09/24/2017 PCP: Harold Anger, MD      Brief Narrative:  Harold Pittman is a 77 y.o. M with CAD, DM, HTN, chronic diastolic CHF, and chronic anemia who presents for exertional dyspnea, fatigue, now found to have low hemoglobin.  Patient reports the insidious development of progressive exertional dyspnea and fatigue over the past 4 months without cough, chest pain, fevers, or chills.  He has noted slight swelling in the bilateral lower extremities, but no lower extremity tenderness.  He denies melena or hematochezia, denies alcohol use, and denies using any NSAID.  Last colonoscopy was 10 years ago.  He takes a baby aspirin and PPI daily.     Assessment & Plan:  Symptomatic anemia Chronic blood loss anemia Ferritin and iron saturation studies strongly suggestive of iron deficiency anemia.  This appears to been the case back in 2017.  Appropriate post-transfusion improvement in Hgb to >8. -IV iron -Consult GI, appreciate recommendations   Chronic diastolic CHF Coronary artery disease secondary prevention Hypertension -continue amlodipine, carvedilol, hydralazine, ARB -Hold aspirin -Continue statin  CKD stage III Creatinine at baseline, serum creatinine 1.4 at baseline.  Diabetes -Hold home metformin - Continue sliding scale     DVT prophylaxis: SCDs Code Status: FULL Family Communication: Wife MDM and disposition Plan: The below labs and imaging reports were reviewed and summarized above.    The patient was admitted with blood loss anemia, acute vs chronic.  To have endoscopy tomorrow.     Consultants:   [facility] [place]  Procedures:   EGD and colonoscopy planned tomorrow  Antimicrobials:   None    Subjective: Feels well.  No melena.  No hematochezia.  Feels less out of breath since transfusion.  Objective: Vitals:   09/25/17 0515 09/25/17 0841 09/25/17 1300 09/25/17 1719  BP: (!) 112/49  138/72 138/69 (!) 141/72  Pulse:  70 (!) 50 66  Resp: 16  16   Temp: 97.6 F (36.4 C)  98.6 F (37 C)   TempSrc: Oral  Oral   SpO2: 93% 97% 96% 98%  Weight:      Height:        Intake/Output Summary (Last 24 hours) at 09/25/2017 1738 Last data filed at 09/25/2017 1400 Gross per 24 hour  Intake 1020 ml  Output 500 ml  Net 520 ml   Filed Weights   09/25/17 0116  Weight: 86.5 kg (190 lb 11.2 oz)    Examination: General appearance:  adult male, alert and in no acute distress.   HEENT: Anicteric, conjunctiva pink, lids and lashes normal. No nasal deformity, discharge, epistaxis.  Lips moist, teeth normal, oropharynx moist, oral no oral lesions, hearing normal.   Skin: Warm and dry.   No suspicious rashes or lesions. Cardiac: RRR, nl S1-S2, no murmurs appreciated.  Capillary refill is brisk.  JVP normal.  No LE edema.  Radia  pulses 2+ and symmetric. Respiratory: Normal respiratory rate and rhythm.  CTAB without rales or wheezes. Abdomen: Abdomen soft.  no TTP. No ascites, distension, hepatosplenomegaly.   MSK: No deformities or effusions. Neuro: Awake and alert.  EOMI, moves all extremities. Speech fluent.    Psych: Sensorium intact and responding to questions, attention normal. Affect normal .  Judgment and insight appear normal.    Data Reviewed: I have personally reviewed following labs and imaging studies:  CBC: Recent Labs  Lab 09/23/17 1114 09/24/17 1801 09/25/17 0713  WBC 8.1 6.8 7.6  NEUTROABS 5.6  --   --   HGB 6.6 Repeated and verified X2.* 6.8* 8.6*  HCT 21.9 Repeated and verified X2.* 26.1* 30.0*  MCV 59.3 Repeated and verified X2.* 66.1* 69.9*  PLT 246.0 305 419   Basic Metabolic Panel: Recent Labs  Lab 09/23/17 1114 09/24/17 1801 09/25/17 0713  NA 144 144 144  K 4.0 4.2 3.7  CL 108 109 110  CO2 27 25 26   GLUCOSE 182* 129* 122*  BUN 19 15 12   CREATININE 1.37 1.40* 1.27*  CALCIUM 9.6 9.5 9.1   GFR: Estimated Creatinine Clearance: 54.3 mL/min  (A) (by C-G formula based on SCr of 1.27 mg/dL (H)). Liver Function Tests: No results for input(s): AST, ALT, ALKPHOS, BILITOT, PROT, ALBUMIN in the last 168 hours. No results for input(s): LIPASE, AMYLASE in the last 168 hours. No results for input(s): AMMONIA in the last 168 hours. Coagulation Profile: No results for input(s): INR, PROTIME in the last 168 hours. Cardiac Enzymes: No results for input(s): CKTOTAL, CKMB, CKMBINDEX, TROPONINI in the last 168 hours. BNP (last 3 results) Recent Labs    09/23/17 1114  PROBNP 305.0*   HbA1C: No results for input(s): HGBA1C in the last 72 hours. CBG: Recent Labs  Lab 09/25/17 0430 09/25/17 0743 09/25/17 1141 09/25/17 1626  GLUCAP 113* 114* 120* 158*   Lipid Profile: No results for input(s): CHOL, HDL, LDLCALC, TRIG, CHOLHDL, LDLDIRECT in the last 72 hours. Thyroid Function Tests: No results for input(s): TSH, T4TOTAL, FREET4, T3FREE, THYROIDAB in the last 72 hours. Anemia Panel: Recent Labs    09/24/17 2114  VITAMINB12 208  FOLATE 7.9  FERRITIN 4*  TIBC 526*  IRON 14*  RETICCTPCT 2.0   Urine analysis:    Component Value Date/Time   COLORURINE YELLOW 01/30/2016 0848   APPEARANCEUR CLEAR 01/30/2016 0848   LABSPEC 1.020 01/30/2016 0848   PHURINE 6.0 01/30/2016 0848   GLUCOSEU NEGATIVE 01/30/2016 0848   HGBUR NEGATIVE 01/30/2016 0848   BILIRUBINUR NEGATIVE 01/30/2016 0848   KETONESUR NEGATIVE 01/30/2016 0848   PROTEINUR 100 (A) 08/13/2015 1040   UROBILINOGEN 0.2 01/30/2016 0848   NITRITE NEGATIVE 01/30/2016 0848   LEUKOCYTESUR NEGATIVE 01/30/2016 0848   Sepsis Labs: @LABRCNTIP (procalcitonin:4,lacticacidven:4)  )No results found for this or any previous visit (from the past 240 hour(s)).       Radiology Studies: Dg Chest 2 View  Result Date: 09/24/2017 CLINICAL DATA:  Shortness of breath beginning today. EXAM: CHEST - 2 VIEW COMPARISON:  09/23/2017 FINDINGS: Sternotomy wires unchanged. Lungs are adequately  inflated without airspace consolidation or effusion. Cardiomediastinal silhouette and remainder of the exam is unchanged. IMPRESSION: No active cardiopulmonary disease. Electronically Signed   By: Marin Olp M.D.   On: 09/24/2017 19:24        Scheduled Meds: . amLODipine  10 mg Oral Daily  . carvedilol  25 mg Oral BID WC  . cholecalciferol  2,000 Units Oral Daily  . famotidine  20 mg Oral Daily  . furosemide  40 mg Oral q morning - 10a  . hydrALAZINE  50 mg Oral TID  . insulin aspart  0-9 Units Subcutaneous Q4H  . irbesartan  150 mg Oral Daily  . [START ON 09/26/2017] metoCLOPramide  10 mg Oral Once  . pantoprazole  40 mg Oral Daily  . peg 3350 powder  0.5 kit Oral Once   And  . [START ON 09/26/2017] peg 3350 powder  0.5 kit Oral Once  . pravastatin  80 mg Oral q1800  .  sodium chloride flush  3 mL Intravenous Q12H  . sodium chloride flush  3 mL Intravenous Q12H  . vitamin B-12  1,000 mcg Oral Daily   Continuous Infusions: . sodium chloride    . sodium chloride       LOS: 0 days    Time spent: 25 minutes    Edwin Dada, MD [facility] 09/25/2017, 5:38 PM     Pager [phone] --- please page though AMION:  [email or web address] Password [email or web address] If 7PM-7AM, please contact night-coverage

## 2017-09-25 NOTE — Care Management Obs Status (Signed)
[facility] NOTIFICATION   Patient Details  Name: MEZIAH BLASINGAME MRN: [id number] Date of Birth: Feb 25, 1941   Medicare Observation Status Notification Given:  Yes    Carles Collet, RN 09/25/2017, 2:31 PM

## 2017-09-25 NOTE — Consult Note (Addendum)
[facility] Consult: 9:28 AM 09/25/2017  LOS: 0 days    Referring Provider: Dr Loleta Books  Primary Care Physician:  Alain Marion Evie Lacks, MD Primary Gastroenterologist:  Dr. Thomasenia Sales    Reason for Consultation: FOBT positive, iron deficiency anemia.   HPI: Harold Pittman is a 77 y.o. male.  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 Takes oral iron.  DM 2 on metformin.  HTN.  CAD.  Pulmonary htn.  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   11/1995 EGD.  Normal 11/1995 colonoscopy, screening study.  Hemorrhoids, otherwise normal. 02/2008 Consult colonoscopy, screening study.  2 mm polyp (path HP) in descending colon.  Sigmoid diverticulosis.  Referred to the ED and thus to admission for evaluation of fatigue, DOE progressing over several months and anemia noted on blood work.  Patient also complains of mild, bil, lower extremity swelling. Home medications include Omeprazole 20 mg a day, Zantac 300 mg at at bedtime, aspirin 81 mg daily, iron daily.   On extensive review of systems he has no GI issues.  He is been taking iron for years.  His stool is normally brown.  Never sees blood in his stools.  No constipation or diarrhea.  Stable weight.  Good appetite.  No dysphasia.  No heartburn, nausea, abdominal pain, indigestion.  Does not use NSAIDs, no ETOH.    Labs over the past 3 days as follows:  Hgb 6.6 >> 6.8 >> 2 units PRBCs>> 8.6.  Hgb 9 in 01/2016 MCV 59 >> 69, 65 in 01/2016 Iron, iron saturation low.  Ferritin low at 4.  Elevated TIBC. FOBT positive.  No family history of sickle cell disease or trait, intestinal cancers.  Ulcer disease.  His daughter had menorrhagia associated anemia    Past Medical History:  Diagnosis Date  . Anxiety state, unspecified   . Blood in stool   . Chronic  airway obstruction, not elsewhere classified   . Coronary atherosclerosis of artery bypass graft   . ED (erectile dysfunction)   . Esophageal reflux   . Gout, unspecified   . Osteoarthrosis, unspecified whether generalized or localized, unspecified site   . Other and unspecified hyperlipidemia   . Other specified disorder of rectum and anus   . Personal history of tobacco use, presenting hazards to health   . Type II or unspecified type diabetes mellitus without mention of complication, not stated as uncontrolled   . Unspecified essential hypertension     Past Surgical History:  Procedure Laterality Date  . CORONARY ARTERY BYPASS GRAFT  55   LIMA to LAD, sequential saphenous vein graft to the first and second diagonal, sequential saphenous vein graft to the intermediate OM and circumflex and SVG to RCA  . KNEE SURGERY     BILATERAL  . ROTATOR CUFF REPAIR      Prior to Admission medications   Medication Sig Start Date End Date Taking? Authorizing Provider  amLODipine (NORVASC) 10 MG tablet TAKE 1 TABLET BY MOUTH  DAILY Patient taking differently: TAKE 1 TABLET(59m) BY MOUTH  DAILY 07/01/17  Yes Plotnikov, Aleksei V, MD  aspirin 81 MG EC tablet Take 81 mg by mouth daily.     Yes [provider]  carvedilol (COREG) 25 MG tablet TAKE 1 TABLET BY MOUTH TWO  TIMES DAILY WITH MEALS Patient taking differently: TAKE 1 TABLET(25mg) BY MOUTH TWO  TIMES DAILY WITH MEALS 07/01/17  Yes Plotnikov, Aleksei V, MD  Cholecalciferol 1000 UNITS tablet Take 2,000 Units by mouth daily.     Yes [provider]  colchicine 0.6 MG tablet Take 1 tablet (0.6 mg total) by mouth 2 (two) times daily as needed. Patient taking differently: Take 0.6 mg by mouth 2 (two) times daily as needed (For gout).  09/23/17  Yes Plotnikov, Aleksei V, MD  Cyanocobalamin (VITAMIN B-12) 1000 MCG SUBL Place 1 tablet (1,000 mcg total) under the tongue daily. 08/17/15  Yes Plotnikov, Aleksei V, MD  docusate sodium  (COLACE) 100 MG capsule Take 1 capsule (100 mg total) by mouth 2 (two) times daily. 07/26/15  Yes Chiu, Stephen K, MD  ferrous sulfate 325 (65 FE) MG tablet Take 1 tablet (325 mg total) by mouth daily. 01/31/16 09/24/17 Yes Plotnikov, Aleksei V, MD  furosemide (LASIX) 20 MG tablet Take 2 tablets (40 mg total) by mouth every morning. 09/23/17  Yes Plotnikov, Aleksei V, MD  guaiFENesin (MUCINEX) 600 MG 12 hr tablet Take 1 tablet (600 mg total) by mouth 2 (two) times daily as needed for cough or to loosen phlegm. 06/12/16  Yes Nche, Charlotte Lum, NP  hydrALAZINE (APRESOLINE) 50 MG tablet TAKE 1 TABLET BY MOUTH 3  TIMES DAILY Patient taking differently: TAKE 1 TABLET(50mg) BY MOUTH 3  TIMES DAILY 07/01/17  Yes Plotnikov, Aleksei V, MD  irbesartan (AVAPRO) 150 MG tablet Take 1 tablet (150 mg total) by mouth daily. 12/31/16  Yes Plotnikov, Aleksei V, MD  loperamide (IMODIUM) 2 MG capsule Take 1 capsule by mouth 4  times daily as needed for  diarrhea or loose stools 01/17/15  Yes Plotnikov, Aleksei V, MD  metFORMIN (GLUCOPHAGE XR) 750 MG 24 hr tablet Take 1 tablet (750 mg total) by mouth daily with breakfast. 12/31/16  Yes Plotnikov, Aleksei V, MD  omeprazole (PRILOSEC) 20 MG capsule TAKE 1 CAPSULE BY MOUTH  DAILY 07/01/17  Yes Plotnikov, Aleksei V, MD  pravastatin (PRAVACHOL) 80 MG tablet TAKE 1 TABLET BY MOUTH AT  BEDTIME 07/01/17  Yes Plotnikov, Aleksei V, MD  ranitidine (ZANTAC) 300 MG tablet TAKE 1 TABLET BY MOUTH  EVERY EVENING 07/01/17  Yes Plotnikov, Aleksei V, MD  tadalafil (CIALIS) 20 MG tablet Take as directed Patient taking differently: Take 20 mg by mouth daily as needed for erectile dysfunction.  09/26/10  Yes Nadel, Scott M, MD  zolpidem (AMBIEN) 5 MG tablet Take 1 tablet (5 mg total) by mouth at bedtime as needed for sleep. Patient taking differently: Take 5 mg by mouth at bedtime.  03/25/13  Yes John, James W, MD  albuterol (PROAIR HFA) 108 (90 BASE) MCG/ACT inhaler Inhale 2 puffs into the lungs 2  (two) times daily. Patient not taking: Reported on 09/24/2017 09/26/10   Nadel, Scott M, MD  beclomethasone (QVAR) 80 MCG/ACT inhaler Inhale 2 puffs into the lungs 2 (two) times daily. Patient not taking: Reported on 09/24/2017 09/26/10   Nadel, Scott M, MD  ipratropium (ATROVENT) 0.03 % nasal spray Place 2 sprays into both nostrils 2 (two) times daily. Do not use for more than 5days. Patient not taking: Reported on 09/24/2017 06/12/16   Nche, Charlotte Lum,   NP    Scheduled Meds: . amLODipine  10 mg Oral Daily  . carvedilol  25 mg Oral BID WC  . cholecalciferol  2,000 Units Oral Daily  . famotidine  20 mg Oral Daily  . furosemide  40 mg Oral q morning - 10a  . hydrALAZINE  50 mg Oral TID  . insulin aspart  0-9 Units Subcutaneous Q4H  . irbesartan  150 mg Oral Daily  . pantoprazole  40 mg Oral Daily  . pravastatin  80 mg Oral q1800  . sodium chloride flush  3 mL Intravenous Q12H  . sodium chloride flush  3 mL Intravenous Q12H  . vitamin B-12  1,000 mcg Oral Daily   Infusions: . sodium chloride    . sodium chloride     PRN Meds: sodium chloride, acetaminophen **OR** acetaminophen, guaiFENesin, HYDROcodone-acetaminophen, ondansetron **OR** ondansetron (ZOFRAN) IV, sodium chloride flush, zolpidem   Allergies as of 09/24/2017 - Review Complete 09/24/2017  Allergen Reaction Noted  . Benazepril Shortness Of Breath 12/31/2016    Family History  Problem Relation Age of Onset  . Depression Sister   . Heart disease Sister   . Heart disease Mother 1       CAD  . Mental illness Father        Alzheimer's  . Hypertension Other   . Coronary artery disease Other        Male 1st degree relative <50  . Colon cancer Neg Hx     Social History   Socioeconomic History  . Marital status: Widowed    Spouse name: Not on file  . Number of children: Not on file  . Years of education: Not on file  . Highest education level: Not on file  Occupational History  . Occupation: RETIRED  Social  Needs  . Financial resource strain: Not on file  . Food insecurity:    Worry: Not on file    Inability: Not on file  . Transportation needs:    Medical: Not on file    Non-medical: Not on file  Tobacco Use  . Smoking status: Former Research scientist (life sciences)  . Smokeless tobacco: Never Used  . Tobacco comment: Stopped 1997  Substance and Sexual Activity  . Alcohol use: No    Comment: Stopped 1997  . Drug use: No  . Sexual activity: Not Currently  Lifestyle  . Physical activity:    Days per week: Not on file    Minutes per session: Not on file  . Stress: Not on file  Relationships  . Social connections:    Talks on phone: Not on file    Gets together: Not on file    Attends religious service: Not on file    Active member of club or organization: Not on file    Attends meetings of clubs or organizations: Not on file    Relationship status: Not on file  . Intimate partner violence:    Fear of current or ex partner: Not on file    Emotionally abused: Not on file    Physically abused: Not on file    Forced sexual activity: Not on file  Other Topics Concern  . Not on file  Social History Narrative   Patient does not get regular exercise   Daily Caffeine use - 6      Wife decease 2005/05/25;        REVIEW OF SYSTEMS: Constitutional: Weakness, fatigue. ENT:  No nose bleeds Pulm: DOE, resolves with rest. CV:  No angina,  no LE edema.  Some tachycardia with exertion resolves with rest. GU:  No hematuria, no frequency GI:  Per HPI Heme: No unusual or excessive bleeding or bruising. Transfusions: No transfusions in the past. Neuro:  No headaches, no peripheral tingling or numbness Derm:  No itching, no rash or sores.  Endocrine:  No sweats or chills.  No polyuria or dysuria Immunization: Reviewed.  He is up-to-date on his flu, Pneumovax, tetanus. Travel:  None beyond local counties in last few months.    PHYSICAL EXAM: Vital signs in last 24 hours: Vitals:   09/25/17 0515 09/25/17 0841  BP:  (!) 112/49 138/72  Pulse:  70  Resp: 16   Temp: 97.6 F (36.4 C)   SpO2: 93% 97%   Wt Readings from Last 3 Encounters:  09/25/17 190 lb 11.2 oz (86.5 kg)  09/23/17 194 lb (88 kg)  07/25/17 195 lb (88.5 kg)    General: Pleasant, well-appearing, comfortable, alert AAM Head: No facial asymmetry or swelling.  No signs of head trauma. Eyes: Scleral icterus.  No conjunctival pallor.  EOMI. Ears: Not hard of hearing Nose: No congestion, no discharge Mouth: Pink, clear, moist oropharynx, tongue midline. Neck: No JVD, no masses, no thyromegaly. Lungs: Breath sounds and clear bilaterally.  No dyspnea.  No cough. Heart: RRR.  No MRG.  S1, S2 present Abdomen: Soft.  Not tender or distended.  No masses, HSM, bruits, hernias..   Rectal: Did not repeat rectal exam.  Was FOBT positive confirmed in the lab. Musc/Skeltl: No joint swelling, redness, gross deformity. Extremities: No CCE. Neurologic: Alert.  Intelligent.  Oriented x3.  Moves all 4 limbs, strength not tested.  No tremors, no asterixis. Skin: No rashes, no sores, no suspicious lesions. Tattoos: None. Nodes: No cervical adenopathy. Psych: Pleasant, calm, cooperative.  Speech fluid.  Asking appropriate/intelligent questions.  Intake/Output from previous day: 06/18 0701 - 06/19 0700 In: 660 [Blood:660] Out: 500 [Urine:500] Intake/Output this shift: Total I/O In: 3 [I.V.:3] Out: -   LAB RESULTS: Recent Labs    09/23/17 1114 09/24/17 1801 09/25/17 0713  WBC 8.1 6.8 7.6  HGB 6.6 Repeated and verified X2.* 6.8* 8.6*  HCT 21.9 Repeated and verified X2.* 26.1* 30.0*  PLT 246.0 305 233   BMET Lab Results  Component Value Date   NA 144 09/25/2017   NA 144 09/24/2017   NA 144 09/23/2017   K 3.7 09/25/2017   K 4.2 09/24/2017   K 4.0 09/23/2017   CL 110 09/25/2017   CL 109 09/24/2017   CL 108 09/23/2017   CO2 26 09/25/2017   CO2 25 09/24/2017   CO2 27 09/23/2017   GLUCOSE 122 (H) 09/25/2017   GLUCOSE 129 (H) 09/24/2017    GLUCOSE 182 (H) 09/23/2017   BUN 12 09/25/2017   BUN 15 09/24/2017   BUN 19 09/23/2017   CREATININE 1.27 (H) 09/25/2017   CREATININE 1.40 (H) 09/24/2017   CREATININE 1.37 09/23/2017   CALCIUM 9.1 09/25/2017   CALCIUM 9.5 09/24/2017   CALCIUM 9.6 09/23/2017    RADIOLOGY STUDIES: Dg Chest 2 View  Result Date: 09/24/2017 CLINICAL DATA:  Shortness of breath beginning today. EXAM: CHEST - 2 VIEW COMPARISON:  09/23/2017 FINDINGS: Sternotomy wires unchanged. Lungs are adequately inflated without airspace consolidation or effusion. Cardiomediastinal silhouette and remainder of the exam is unchanged. IMPRESSION: No active cardiopulmonary disease. Electronically Signed   By: Daniel  Boyle M.D.   On: 09/24/2017 19:24   Dg Chest 2 View  Result Date: 09/23/2017 CLINICAL   DATA:  Shortness of breath with exertion EXAM: CHEST - 2 VIEW COMPARISON:  December 31, 2016 FINDINGS: There is slight atelectasis in the right base. The lungs elsewhere are clear. Heart is upper normal in size with pulmonary vascularity normal. No adenopathy. Patient is status post coronary artery bypass grafting. There is aortic atherosclerosis. No evident bone lesions. IMPRESSION: Slight right base atelectasis. No edema or consolidation. Stable cardiac silhouette. There is aortic atherosclerosis. Aortic Atherosclerosis (ICD10-I70.0). Electronically Signed   By: Lowella Grip III M.D.   On: 09/23/2017 13:11      IMPRESSION:   *    FOBT +, symptomatic iron def anemia.  No positive GI symptoms. Microcytic anemia dates back to 07/2015 and taking daily iron for several years.   Mild diverticulosis, hyperplastic polyp on previous colonoscopy.  Due repeat screening study in 02/2018.  Normal EGD in 1997.  Takes PPI and H2 blocker for well-controlled reflux.    *  Hx CAD, CABG 1997, moderate pulm htn on Echo 07/2015.  No anginal symptoms in setting of symptomatic anemia.    *   Mild renal insufficiency, improved.    PLAN:      *  Set EGD and colonoscopy with moderate sedation  (MAC not available at the time needed) for tomorrow at 1445.  Discussed this with the patient and his daughter and both are agreeable and eager to proceed with diagnostic studies.  *   Clears.    *   Feraheme infusion ordered.     Azucena Freed  09/25/2017, 9:28 AM Phone [phone]    Attending physician's note   I have taken a history, examined the patient and reviewed the chart. I agree with the Advanced Practitioner's note, impression and recommendations. Iron deficiency anemia and FOBT positive stool. R/O colorectal neoplasm, ulcer, AVM, etc. Iron replacement starting with Feraheme. Colonoscopy and EGD tomorrow.   Lucio Edward, MD FACG [phone] office

## 2017-09-25 NOTE — H&P (View-Only) (Signed)
[facility] Consult: 9:28 AM 09/25/2017  LOS: 0 days    Referring Provider: Dr Loleta Books  Primary Care Physician:  Alain Marion Evie Lacks, MD Primary Gastroenterologist:  Dr. Thomasenia Sales    Reason for Consultation: FOBT positive, iron deficiency anemia.   HPI: Harold Pittman is a 77 y.o. male.  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 Takes oral iron.  DM 2 on metformin.  HTN.  CAD.  Pulmonary htn.  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   11/1995 EGD.  Normal 11/1995 colonoscopy, screening study.  Hemorrhoids, otherwise normal. 02/2008 Consult colonoscopy, screening study.  2 mm polyp (path HP) in descending colon.  Sigmoid diverticulosis.  Referred to the ED and thus to admission for evaluation of fatigue, DOE progressing over several months and anemia noted on blood work.  Patient also complains of mild, bil, lower extremity swelling. Home medications include Omeprazole 20 mg a day, Zantac 300 mg at at bedtime, aspirin 81 mg daily, iron daily.   On extensive review of systems he has no GI issues.  He is been taking iron for years.  His stool is normally brown.  Never sees blood in his stools.  No constipation or diarrhea.  Stable weight.  Good appetite.  No dysphasia.  No heartburn, nausea, abdominal pain, indigestion.  Does not use NSAIDs, no ETOH.    Labs over the past 3 days as follows:  Hgb 6.6 >> 6.8 >> 2 units PRBCs>> 8.6.  Hgb 9 in 01/2016 MCV 59 >> 69, 65 in 01/2016 Iron, iron saturation low.  Ferritin low at 4.  Elevated TIBC. FOBT positive.  No family history of sickle cell disease or trait, intestinal cancers.  Ulcer disease.  His daughter had menorrhagia associated anemia    Past Medical History:  Diagnosis Date  . Anxiety state, unspecified   . Blood in stool   . Chronic  airway obstruction, not elsewhere classified   . Coronary atherosclerosis of artery bypass graft   . ED (erectile dysfunction)   . Esophageal reflux   . Gout, unspecified   . Osteoarthrosis, unspecified whether generalized or localized, unspecified site   . Other and unspecified hyperlipidemia   . Other specified disorder of rectum and anus   . Personal history of tobacco use, presenting hazards to health   . Type II or unspecified type diabetes mellitus without mention of complication, not stated as uncontrolled   . Unspecified essential hypertension     Past Surgical History:  Procedure Laterality Date  . CORONARY ARTERY BYPASS GRAFT  55   LIMA to LAD, sequential saphenous vein graft to the first and second diagonal, sequential saphenous vein graft to the intermediate OM and circumflex and SVG to RCA  . KNEE SURGERY     BILATERAL  . ROTATOR CUFF REPAIR      Prior to Admission medications   Medication Sig Start Date End Date Taking? Authorizing Provider  amLODipine (NORVASC) 10 MG tablet TAKE 1 TABLET BY MOUTH  DAILY Patient taking differently: TAKE 1 TABLET(59m) BY MOUTH  DAILY 07/01/17  Yes Plotnikov, Evie Lacks, MD  aspirin 81 MG EC tablet Take 81 mg by mouth daily.     Yes [provider]  carvedilol (COREG) 25 MG tablet TAKE 1 TABLET BY MOUTH TWO  TIMES DAILY WITH MEALS Patient taking differently: TAKE 1 TABLET(29m) BY MOUTH TWO  TIMES DAILY WITH MEALS 07/01/17  Yes Plotnikov, AEvie Lacks MD  Cholecalciferol 1000 UNITS tablet Take 2,000 Units by mouth daily.     Yes [provider]  colchicine 0.6 MG tablet Take 1 tablet (0.6 mg total) by mouth 2 (two) times daily as needed. Patient taking differently: Take 0.6 mg by mouth 2 (two) times daily as needed (For gout).  09/23/17  Yes Plotnikov, AEvie Lacks MD  Cyanocobalamin (VITAMIN B-12) 1000 MCG SUBL Place 1 tablet (1,000 mcg total) under the tongue daily. 08/17/15  Yes Plotnikov, AEvie Lacks MD  docusate sodium  (COLACE) 100 MG capsule Take 1 capsule (100 mg total) by mouth 2 (two) times daily. 07/26/15  Yes CDonne Hazel MD  ferrous sulfate 325 (65 FE) MG tablet Take 1 tablet (325 mg total) by mouth daily. 01/31/16 09/24/17 Yes Plotnikov, AEvie Lacks MD  furosemide (LASIX) 20 MG tablet Take 2 tablets (40 mg total) by mouth every morning. 09/23/17  Yes Plotnikov, AEvie Lacks MD  guaiFENesin (MUCINEX) 600 MG 12 hr tablet Take 1 tablet (600 mg total) by mouth 2 (two) times daily as needed for cough or to loosen phlegm. 06/12/16  Yes Nche, CCharlene Brooke NP  hydrALAZINE (APRESOLINE) 50 MG tablet TAKE 1 TABLET BY MOUTH 3  TIMES DAILY Patient taking differently: TAKE 1 TABLET(535m BY MOUTH 3  TIMES DAILY 07/01/17  Yes Plotnikov, AlEvie LacksMD  irbesartan (AVAPRO) 150 MG tablet Take 1 tablet (150 mg total) by mouth daily. 12/31/16  Yes Plotnikov, AlEvie LacksMD  loperamide (IMODIUM) 2 MG capsule Take 1 capsule by mouth 4  times daily as needed for  diarrhea or loose stools 01/17/15  Yes Plotnikov, AlEvie LacksMD  metFORMIN (GLUCOPHAGE XR) 750 MG 24 hr tablet Take 1 tablet (750 mg total) by mouth daily with breakfast. 12/31/16  Yes Plotnikov, AlEvie LacksMD  omeprazole (PRILOSEC) 20 MG capsule TAKE 1 CAPSULE BY MOUTH  DAILY 07/01/17  Yes Plotnikov, AlEvie LacksMD  pravastatin (PRAVACHOL) 80 MG tablet TAKE 1 TABLET BY MOUTH AT  BEDTIME 07/01/17  Yes Plotnikov, AlEvie LacksMD  ranitidine (ZANTAC) 300 MG tablet TAKE 1 TABLET BY MOUTH  EVERY EVENING 07/01/17  Yes Plotnikov, AlEvie LacksMD  tadalafil (CIALIS) 20 MG tablet Take as directed Patient taking differently: Take 20 mg by mouth daily as needed for erectile dysfunction.  09/26/10  Yes NaNoralee SpaceMD  zolpidem (AMBIEN) 5 MG tablet Take 1 tablet (5 mg total) by mouth at bedtime as needed for sleep. Patient taking differently: Take 5 mg by mouth at bedtime.  03/25/13  Yes JoBiagio BorgMD  albuterol (PS[facility]FA) 108 (90 BASE) MCG/ACT inhaler Inhale 2 puffs into the lungs 2  (two) times daily. Patient not taking: Reported on 09/24/2017 09/26/10   NaNoralee SpaceMD  beclomethasone (QVAR) 80 MCG/ACT inhaler Inhale 2 puffs into the lungs 2 (two) times daily. Patient not taking: Reported on 09/24/2017 09/26/10   NaNoralee SpaceMD  ipratropium (ATROVENT) 0.03 % nasal spray Place 2 sprays into both nostrils 2 (two) times daily. Do not use for more than 5days. Patient not taking: Reported on 09/24/2017 06/12/16   Nche, ChCharlene Brooke  NP    Scheduled Meds: . amLODipine  10 mg Oral Daily  . carvedilol  25 mg Oral BID WC  . cholecalciferol  2,000 Units Oral Daily  . famotidine  20 mg Oral Daily  . furosemide  40 mg Oral q morning - 10a  . hydrALAZINE  50 mg Oral TID  . insulin aspart  0-9 Units Subcutaneous Q4H  . irbesartan  150 mg Oral Daily  . pantoprazole  40 mg Oral Daily  . pravastatin  80 mg Oral q1800  . sodium chloride flush  3 mL Intravenous Q12H  . sodium chloride flush  3 mL Intravenous Q12H  . vitamin B-12  1,000 mcg Oral Daily   Infusions: . sodium chloride    . sodium chloride     PRN Meds: sodium chloride, acetaminophen **OR** acetaminophen, guaiFENesin, HYDROcodone-acetaminophen, ondansetron **OR** ondansetron (ZOFRAN) IV, sodium chloride flush, zolpidem   Allergies as of 09/24/2017 - Review Complete 09/24/2017  Allergen Reaction Noted  . Benazepril Shortness Of Breath 12/31/2016    Family History  Problem Relation Age of Onset  . Depression Sister   . Heart disease Sister   . Heart disease Mother 88       CAD  . Mental illness Father        Alzheimer's  . Hypertension Other   . Coronary artery disease Other        Male 1st degree relative <50  . Colon cancer Neg Hx     Social History   Socioeconomic History  . Marital status: Widowed    Spouse name: Not on file  . Number of children: Not on file  . Years of education: Not on file  . Highest education level: Not on file  Occupational History  . Occupation: RETIRED  Social  Needs  . Financial resource strain: Not on file  . Food insecurity:    Worry: Not on file    Inability: Not on file  . Transportation needs:    Medical: Not on file    Non-medical: Not on file  Tobacco Use  . Smoking status: Former Research scientist (life sciences)  . Smokeless tobacco: Never Used  . Tobacco comment: Stopped 1997  Substance and Sexual Activity  . Alcohol use: No    Comment: Stopped 1997  . Drug use: No  . Sexual activity: Not Currently  Lifestyle  . Physical activity:    Days per week: Not on file    Minutes per session: Not on file  . Stress: Not on file  Relationships  . Social connections:    Talks on phone: Not on file    Gets together: Not on file    Attends religious service: Not on file    Active member of club or organization: Not on file    Attends meetings of clubs or organizations: Not on file    Relationship status: Not on file  . Intimate partner violence:    Fear of current or ex partner: Not on file    Emotionally abused: Not on file    Physically abused: Not on file    Forced sexual activity: Not on file  Other Topics Concern  . Not on file  Social History Narrative   Patient does not get regular exercise   Daily Caffeine use - 6      Wife decease 05-31-05;        REVIEW OF SYSTEMS: Constitutional: Weakness, fatigue. ENT:  No nose bleeds Pulm: DOE, resolves with rest. CV:  No angina,  no LE edema.  Some tachycardia with exertion resolves with rest. GU:  No hematuria, no frequency GI:  Per HPI Heme: No unusual or excessive bleeding or bruising. Transfusions: No transfusions in the past. Neuro:  No headaches, no peripheral tingling or numbness Derm:  No itching, no rash or sores.  Endocrine:  No sweats or chills.  No polyuria or dysuria Immunization: Reviewed.  He is up-to-date on his flu, Pneumovax, tetanus. Travel:  None beyond local counties in last few months.    PHYSICAL EXAM: Vital signs in last 24 hours: Vitals:   09/25/17 0515 09/25/17 0841  BP:  (!) 112/49 138/72  Pulse:  70  Resp: 16   Temp: 97.6 F (36.4 C)   SpO2: 93% 97%   Wt Readings from Last 3 Encounters:  09/25/17 190 lb 11.2 oz (86.5 kg)  09/23/17 194 lb (88 kg)  07/25/17 195 lb (88.5 kg)    General: Pleasant, well-appearing, comfortable, alert AAM Head: No facial asymmetry or swelling.  No signs of head trauma. Eyes: Scleral icterus.  No conjunctival pallor.  EOMI. Ears: Not hard of hearing Nose: No congestion, no discharge Mouth: Pink, clear, moist oropharynx, tongue midline. Neck: No JVD, no masses, no thyromegaly. Lungs: Breath sounds and clear bilaterally.  No dyspnea.  No cough. Heart: RRR.  No MRG.  S1, S2 present Abdomen: Soft.  Not tender or distended.  No masses, HSM, bruits, hernias..   Rectal: Did not repeat rectal exam.  Was FOBT positive confirmed in the lab. Musc/Skeltl: No joint swelling, redness, gross deformity. Extremities: No CCE. Neurologic: Alert.  Intelligent.  Oriented x3.  Moves all 4 limbs, strength not tested.  No tremors, no asterixis. Skin: No rashes, no sores, no suspicious lesions. Tattoos: None. Nodes: No cervical adenopathy. Psych: Pleasant, calm, cooperative.  Speech fluid.  Asking appropriate/intelligent questions.  Intake/Output from previous day: 06/18 0701 - 06/19 0700 In: 660 [Blood:660] Out: 500 [Urine:500] Intake/Output this shift: Total I/O In: 3 [I.V.:3] Out: -   LAB RESULTS: Recent Labs    09/23/17 1114 09/24/17 1801 09/25/17 0713  WBC 8.1 6.8 7.6  HGB 6.6 Repeated and verified X2.* 6.8* 8.6*  HCT 21.9 Repeated and verified X2.* 26.1* 30.0*  PLT 246.0 305 233   BMET Lab Results  Component Value Date   NA 144 09/25/2017   NA 144 09/24/2017   NA 144 09/23/2017   K 3.7 09/25/2017   K 4.2 09/24/2017   K 4.0 09/23/2017   CL 110 09/25/2017   CL 109 09/24/2017   CL 108 09/23/2017   CO2 26 09/25/2017   CO2 25 09/24/2017   CO2 27 09/23/2017   GLUCOSE 122 (H) 09/25/2017   GLUCOSE 129 (H) 09/24/2017    GLUCOSE 182 (H) 09/23/2017   BUN 12 09/25/2017   BUN 15 09/24/2017   BUN 19 09/23/2017   CREATININE 1.27 (H) 09/25/2017   CREATININE 1.40 (H) 09/24/2017   CREATININE 1.37 09/23/2017   CALCIUM 9.1 09/25/2017   CALCIUM 9.5 09/24/2017   CALCIUM 9.6 09/23/2017    RADIOLOGY STUDIES: Dg Chest 2 View  Result Date: 09/24/2017 CLINICAL DATA:  Shortness of breath beginning today. EXAM: CHEST - 2 VIEW COMPARISON:  09/23/2017 FINDINGS: Sternotomy wires unchanged. Lungs are adequately inflated without airspace consolidation or effusion. Cardiomediastinal silhouette and remainder of the exam is unchanged. IMPRESSION: No active cardiopulmonary disease. Electronically Signed   By: Marin Olp M.D.   On: 09/24/2017 19:24   Dg Chest 2 View  Result Date: 09/23/2017 CLINICAL  DATA:  Shortness of breath with exertion EXAM: CHEST - 2 VIEW COMPARISON:  December 31, 2016 FINDINGS: There is slight atelectasis in the right base. The lungs elsewhere are clear. Heart is upper normal in size with pulmonary vascularity normal. No adenopathy. Patient is status post coronary artery bypass grafting. There is aortic atherosclerosis. No evident bone lesions. IMPRESSION: Slight right base atelectasis. No edema or consolidation. Stable cardiac silhouette. There is aortic atherosclerosis. Aortic Atherosclerosis (ICD10-I70.0). Electronically Signed   By: Lowella Grip III M.D.   On: 09/23/2017 13:11      IMPRESSION:   *    FOBT +, symptomatic iron def anemia.  No positive GI symptoms. Microcytic anemia dates back to 07/2015 and taking daily iron for several years.   Mild diverticulosis, hyperplastic polyp on previous colonoscopy.  Due repeat screening study in 02/2018.  Normal EGD in 1997.  Takes PPI and H2 blocker for well-controlled reflux.    *  Hx CAD, CABG 1997, moderate pulm htn on Echo 07/2015.  No anginal symptoms in setting of symptomatic anemia.    *   Mild renal insufficiency, improved.    PLAN:      *  Set EGD and colonoscopy with moderate sedation  (MAC not available at the time needed) for tomorrow at 1445.  Discussed this with the patient and his daughter and both are agreeable and eager to proceed with diagnostic studies.  *   Clears.    *   Feraheme infusion ordered.     Azucena Freed  09/25/2017, 9:28 AM Phone [phone]    Attending physician's note   I have taken a history, examined the patient and reviewed the chart. I agree with the Advanced Practitioner's note, impression and recommendations. Iron deficiency anemia and FOBT positive stool. R/O colorectal neoplasm, ulcer, AVM, etc. Iron replacement starting with Feraheme. Colonoscopy and EGD tomorrow.   Lucio Edward, MD FACG [phone] office

## 2017-09-26 ENCOUNTER — Encounter (HOSPITAL_COMMUNITY): Payer: Self-pay | Admitting: Gastroenterology

## 2017-09-26 ENCOUNTER — Observation Stay (HOSPITAL_COMMUNITY): Payer: Medicare Other | Admitting: Critical Care Medicine

## 2017-09-26 ENCOUNTER — Encounter (HOSPITAL_COMMUNITY)
Admission: EM | Disposition: A | Discharge status: Home / Self Care | Payer: Self-pay | Source: Home / Self Care | Attending: Emergency Medicine

## 2017-09-26 DIAGNOSIS — K5730 Diverticulosis of large intestine without perforation or abscess without bleeding: Secondary | ICD-10-CM

## 2017-09-26 DIAGNOSIS — D123 Benign neoplasm of transverse colon: Secondary | ICD-10-CM

## 2017-09-26 DIAGNOSIS — D649 Anemia, unspecified: Secondary | ICD-10-CM | POA: Diagnosis not present

## 2017-09-26 DIAGNOSIS — D509 Iron deficiency anemia, unspecified: Secondary | ICD-10-CM

## 2017-09-26 DIAGNOSIS — D120 Benign neoplasm of cecum: Secondary | ICD-10-CM

## 2017-09-26 DIAGNOSIS — K2970 Gastritis, unspecified, without bleeding: Secondary | ICD-10-CM | POA: Diagnosis not present

## 2017-09-26 DIAGNOSIS — E1151 Type 2 diabetes mellitus with diabetic peripheral angiopathy without gangrene: Secondary | ICD-10-CM | POA: Diagnosis not present

## 2017-09-26 DIAGNOSIS — D125 Benign neoplasm of sigmoid colon: Secondary | ICD-10-CM

## 2017-09-26 DIAGNOSIS — D131 Benign neoplasm of stomach: Secondary | ICD-10-CM | POA: Diagnosis not present

## 2017-09-26 DIAGNOSIS — K3189 Other diseases of stomach and duodenum: Secondary | ICD-10-CM | POA: Diagnosis not present

## 2017-09-26 DIAGNOSIS — K640 First degree hemorrhoids: Secondary | ICD-10-CM | POA: Diagnosis not present

## 2017-09-26 DIAGNOSIS — K449 Diaphragmatic hernia without obstruction or gangrene: Secondary | ICD-10-CM | POA: Diagnosis not present

## 2017-09-26 DIAGNOSIS — K317 Polyp of stomach and duodenum: Secondary | ICD-10-CM | POA: Diagnosis not present

## 2017-09-26 DIAGNOSIS — R195 Other fecal abnormalities: Secondary | ICD-10-CM | POA: Diagnosis not present

## 2017-09-26 HISTORY — PX: BIOPSY: SHX5522

## 2017-09-26 HISTORY — PX: ESOPHAGOGASTRODUODENOSCOPY (EGD) WITH PROPOFOL: SHX5813

## 2017-09-26 HISTORY — PX: POLYPECTOMY: SHX5525

## 2017-09-26 HISTORY — PX: COLONOSCOPY WITH PROPOFOL: SHX5780

## 2017-09-26 LAB — BASIC METABOLIC PANEL
ANION GAP: 9 (ref 5–15)
BUN: 10 mg/dL (ref 6–20)
CALCIUM: 9.1 mg/dL (ref 8.9–10.3)
CO2: 26 mmol/L (ref 22–32)
CREATININE: 1.27 mg/dL — AB (ref 0.61–1.24)
Chloride: 106 mmol/L (ref 101–111)
GFR calc Af Amer: >60 mL/min (ref >60)
GFR calc non Af Amer: 53 mL/min — ABNORMAL LOW (ref >60)
Glucose, Bld: 105 mg/dL — ABNORMAL HIGH (ref 65–99)
Potassium: 3.3 mmol/L — ABNORMAL LOW (ref 3.5–5.1)
SODIUM: 141 mmol/L (ref 135–145)

## 2017-09-26 LAB — BPAM RBC
BLOOD PRODUCT EXPIRATION DATE: 201907162359
BLOOD PRODUCT EXPIRATION DATE: 201907162359
ISSUE DATE / TIME: 201906190011
ISSUE DATE / TIME: 201906190227
UNIT TYPE AND RH: 5100
Unit Type and Rh: 5100

## 2017-09-26 LAB — CBC
HEMATOCRIT: 30.8 % — AB (ref 39.0–52.0)
HEMOGLOBIN: 8.8 g/dL — AB (ref 13.0–17.0)
MCH: 19.6 pg — ABNORMAL LOW (ref 26.0–34.0)
MCHC: 28.6 g/dL — ABNORMAL LOW (ref 30.0–36.0)
MCV: 68.6 fL — ABNORMAL LOW (ref 78.0–100.0)
Platelets: 220 10*3/uL (ref 150–400)
RBC: 4.49 MIL/uL (ref 4.22–5.81)
RDW: 23.9 % — AB (ref 11.5–15.5)
WBC: 6.7 10*3/uL (ref 4.0–10.5)

## 2017-09-26 LAB — TYPE AND SCREEN
ABO/RH(D): O POS
Antibody Screen: NEGATIVE
Unit division: 0
Unit division: 0

## 2017-09-26 LAB — GLUCOSE, CAPILLARY
GLUCOSE-CAPILLARY: 104 mg/dL — AB (ref 65–99)
GLUCOSE-CAPILLARY: 114 mg/dL — AB (ref 65–99)
GLUCOSE-CAPILLARY: 110 mg/dL — AB (ref 65–99)

## 2017-09-26 SURGERY — ESOPHAGOGASTRODUODENOSCOPY (EGD) WITH PROPOFOL
Anesthesia: Monitor Anesthesia Care

## 2017-09-26 MED ORDER — FERROUS SULFATE 325 (65 FE) MG PO TABS: 325.0000 mg | ORAL_TABLET | Freq: Every day | ORAL | 3 refills | Status: DC

## 2017-09-26 MED ORDER — ONDANSETRON HCL 4 MG/2ML IJ SOLN
INTRAMUSCULAR | Status: DC | PRN
  Administered 2017-09-26: 4 mg via INTRAVENOUS

## 2017-09-26 MED ORDER — PROPOFOL 10 MG/ML IV BOLUS
INTRAVENOUS | Status: DC | PRN
  Administered 2017-09-26 (×3): 10 mg via INTRAVENOUS
  Administered 2017-09-26: 20 mg via INTRAVENOUS

## 2017-09-26 MED ORDER — PHENYLEPHRINE 40 MCG/ML (10ML) SYRINGE FOR IV PUSH (FOR BLOOD PRESSURE SUPPORT)
PREFILLED_SYRINGE | INTRAVENOUS | Status: DC | PRN
  Administered 2017-09-26: 80 ug via INTRAVENOUS

## 2017-09-26 MED ORDER — LACTATED RINGERS IV SOLN
INTRAVENOUS | Status: DC
  Administered 2017-09-26: 13:00:00 via INTRAVENOUS

## 2017-09-26 MED ORDER — OMEPRAZOLE 40 MG PO CPDR: 40.0000 mg | DELAYED_RELEASE_CAPSULE | Freq: Every day | ORAL | 0 refills | Status: DC

## 2017-09-26 MED ORDER — PROPOFOL 500 MG/50ML IV EMUL
INTRAVENOUS | Status: DC | PRN
  Administered 2017-09-26: 150 ug/kg/min via INTRAVENOUS

## 2017-09-26 SURGICAL SUPPLY — 25 items

## 2017-09-26 NOTE — Transfer of Care (Signed)
Immediate Anesthesia Transfer of Care Note  Patient: Harold Pittman  Procedure(s) Performed: ESOPHAGOGASTRODUODENOSCOPY (EGD) WITH PROPOFOL (N/A ) COLONOSCOPY WITH PROPOFOL (N/A ) BIOPSY POLYPECTOMY  Patient Location: Endoscopy Unit  Anesthesia Type:MAC  Level of Consciousness: awake and alert   Airway & Oxygen Therapy: Patient Spontanous Breathing and Patient connected to nasal cannula oxygen  Post-op Assessment: Report given to RN and Post -op Vital signs reviewed and stable  Post vital signs: Reviewed and stable  Last Vitals:  Vitals Value Taken Time  BP    Temp    Pulse    Resp    SpO2      Last Pain:  Vitals:   09/26/17 1241  TempSrc: Oral  PainSc: 0-No pain         Complications: No apparent anesthesia complications

## 2017-09-26 NOTE — Op Note (Signed)
[facility] Patient Name: Harold Pittman Procedure Date : 09/26/2017 MRN: [id number] Attending MD: Ladene Artist , MD Date of Birth: [birth year] CSN: [id number] Age: 77 Admit Type: Inpatient Procedure:                Upper GI endoscopy Indications:              Iron deficiency anemia, Heme positive stool Providers:                Pricilla Riffle. Fuller Plan, MD, Cleda Daub, RN, Cherylynn Ridges, Technician, Merrilyn Puma, CRNA Referring MD:             [facility] Medicines:                Monitored Anesthesia Care Complications:            No immediate complications. Estimated Blood Loss:     Estimated blood loss was minimal. Procedure:                Pre-Anesthesia Assessment:                           - Prior to the procedure, a History and Physical                            was performed, and patient medications and                            allergies were reviewed. The patient's tolerance of                            previous anesthesia was also reviewed. The risks                            and benefits of the procedure and the sedation                            options and risks were discussed with the patient.                            All questions were answered, and informed consent                            was obtained. Prior Anticoagulants: The patient has                            taken no previous anticoagulant or antiplatelet                            agents. ASA Grade Assessment: II - A patient with                            mild systemic disease. After reviewing the risks  and benefits, the patient was deemed in                            satisfactory condition to undergo the procedure.                           After obtaining informed consent, the endoscope was                            passed under direct vision. Throughout the                            procedure, the patient's blood pressure,  pulse, and                            oxygen saturations were monitored continuously. The                            EG-[id number] ([id number]) scope was introduced through the                            mouth, and advanced to the second part of duodenum.                            The upper GI endoscopy was accomplished without                            difficulty. The patient tolerated the procedure                            well. Scope In: Scope Out: Findings:      The examined esophagus was normal.      Localized moderate inflammation characterized by congestion (edema),       friability, erythema and granularity was found on the lesser curvature       of the gastric antrum. It was soft and appeared to be a thickened       gastric fold. Biopsies were taken with a cold forceps for histology.      A small hiatal hernia was present.      The exam of the stomach was otherwise normal.      The duodenal bulb and second portion of the duodenum were normal. Impression:               - Normal esophagus.                           - Gastritis, thickened fold. Biopsied.                           - Small hiatal hernia.                           - Normal duodenal bulb and second portion of the                            duodenum.  Recommendation:           - Source of iron deficiency not clear. Might need                            further evaluation with a capsule endoscopy as                            outpatient.                           - Return patient to hospital ward for ongoing care.                           - Resume previous diet.                           - Protonix (pantoprazole) 40 mg PO daily.                           - Continue present medications.                           - Await pathology results.                           - Return to GI office in 1 month. Procedure Code(s):        --- Professional ---                           [phone], Esophagogastroduodenoscopy, flexible,                             transoral; with biopsy, single or multiple Diagnosis Code(s):        --- Professional ---                           K29.70, Gastritis, unspecified, without bleeding                           K44.9, Diaphragmatic hernia without obstruction or                            gangrene                           D50.9, Iron deficiency anemia, unspecified                           R19.5, Other fecal abnormalities CPT copyright 2017 American Medical Association. All rights reserved. The codes documented in this report are preliminary and upon coder review may  be revised to meet current compliance requirements. Ladene Artist, MD 09/26/2017 2:02:48 PM This report has been signed electronically. Number of Addenda: 0

## 2017-09-26 NOTE — Interval H&P Note (Signed)
History and Physical Interval Note:  09/26/2017 12:43 PM  Harold Pittman  has presented today for surgery, with the diagnosis of anemia, iron deficiency    FOBT +  The various methods of treatment have been discussed with the patient and family. After consideration of risks, benefits and other options for treatment, the patient has consented to  Procedure(s): ESOPHAGOGASTRODUODENOSCOPY (EGD) WITH PROPOFOL (N/A) COLONOSCOPY WITH PROPOFOL (N/A) as a surgical intervention .  The patient's history has been reviewed, patient examined, no change in status, stable for surgery.  I have reviewed the patient's chart and labs.  Questions were answered to the patient's satisfaction.     Pricilla Riffle. Fuller Plan

## 2017-09-26 NOTE — Anesthesia Preprocedure Evaluation (Signed)
Anesthesia Evaluation  Patient identified by MRN, date of birth, ID band Patient awake    Reviewed: Allergy & Precautions, NPO status , Patient's Chart, lab work & pertinent test results  Airway Mallampati: II  TM Distance: >3 FB Neck ROM: Full    Dental no notable dental hx.    Pulmonary neg pulmonary ROS, COPD, former smoker,    Pulmonary exam normal breath sounds clear to auscultation       Cardiovascular hypertension, + CABG (1997)  Normal cardiovascular exam Rhythm:Regular Rate:Normal     Neuro/Psych negative neurological ROS  negative psych ROS   GI/Hepatic Neg liver ROS, GERD  Controlled and Medicated,  Endo/Other  negative endocrine ROSdiabetes, Type 2, Oral Hypoglycemic Agents  Renal/GU negative Renal ROS  negative genitourinary   Musculoskeletal negative musculoskeletal ROS (+)   Abdominal   Peds negative pediatric ROS (+)  Hematology negative hematology ROS (+)   Anesthesia Other Findings   Reproductive/Obstetrics negative OB ROS                             Anesthesia Physical Anesthesia Plan  ASA: III  Anesthesia Plan: MAC   Post-op Pain Management:    Induction: Intravenous  PONV Risk Score and Plan: 1 and Ondansetron  Airway Management Planned: Nasal Cannula  Additional Equipment:   Intra-op Plan:   Post-operative Plan:   Informed Consent: I have reviewed the patients History and Physical, chart, labs and discussed the procedure including the risks, benefits and alternatives for the proposed anesthesia with the patient or authorized representative who has indicated his/her understanding and acceptance.   Dental advisory given  Plan Discussed with: CRNA  Anesthesia Plan Comments:         Anesthesia Quick Evaluation

## 2017-09-26 NOTE — Op Note (Signed)
[facility] Patient Name: Harold Pittman Procedure Date : 09/26/2017 MRN: [id number] Attending MD: Ladene Artist , MD Date of Birth: 16-Jun-1940 CSN: [id number] Age: 77 Admit Type: Inpatient Procedure:                Colonoscopy Indications:              Heme positive stool, Iron deficiency anemia Providers:                Pricilla Riffle. Fuller Plan, MD, Cleda Daub, RN, Cherylynn Ridges, Technician, Merrilyn Puma, CRNA Referring MD:             [facility] Medicines:                Monitored Anesthesia Care Complications:            No immediate complications. Estimated blood loss:                            None. Estimated Blood Loss:     Estimated blood loss: none. Procedure:                Pre-Anesthesia Assessment:                           - Prior to the procedure, a History and Physical                            was performed, and patient medications and                            allergies were reviewed. The patient's tolerance of                            previous anesthesia was also reviewed. The risks                            and benefits of the procedure and the sedation                            options and risks were discussed with the patient.                            All questions were answered, and informed consent                            was obtained. Prior Anticoagulants: The patient has                            taken no previous anticoagulant or antiplatelet                            agents. ASA Grade Assessment: II - A patient with  mild systemic disease. After reviewing the risks                            and benefits, the patient was deemed in                            satisfactory condition to undergo the procedure.                           After obtaining informed consent, the colonoscope                            was passed under direct vision. Throughout the      procedure, the patient's blood pressure, pulse, and                            oxygen saturations were monitored continuously. The                            EC-[id number] ([id number]) scope was introduced through                            the anus and advanced to the the cecum, identified                            by appendiceal orifice and ileocecal valve. The                            ileocecal valve, appendiceal orifice, and rectum                            were photographed. The quality of the bowel                            preparation was good. The colonoscopy was performed                            without difficulty. The patient tolerated the                            procedure well. Scope In: 1:14:54 PM Scope Out: 1:34:44 PM Scope Withdrawal Time: 0 hours 18 minutes 9 seconds  Total Procedure Duration: 0 hours 19 minutes 50 seconds  Findings:      The perianal and digital rectal examinations were normal.      A 18 mm polyp was found in the sigmoid colon. The polyp was       pedunculated. The polyp was removed with a hot snare. Resection and       retrieval were complete.      Two sessile polyps were found in the transverse colon and cecum. The       polyps were 4 to 5 mm in size. These polyps were removed with a cold       biopsy forceps. Resection and retrieval were complete.      A few small-mouthed diverticula were found in  the left colon. There was       no evidence of diverticular bleeding.      Internal hemorrhoids were found during retroflexion. The hemorrhoids       were medium-sized and Grade I (internal hemorrhoids that do not       prolapse).      The exam was otherwise without abnormality on direct and retroflexion       views. Impression:               - One 18 mm polyp in the sigmoid colon, removed                            with a hot snare. Resected and retrieved.                           - Two 4 to 5 mm polyps in the transverse colon and                             in the cecum, removed with a cold biopsy forceps.                            Resected and retrieved.                           - Mild diverticulosis in the left colon. There was                            no evidence of diverticular bleeding.                           - Internal hemorrhoids.                           - The examination was otherwise normal on direct                            and retroflexion views. Recommendation:           - Repeat colonoscopy in 3 years for surveillance                            pending pathology review.                           - Patient has a contact number available for                            emergencies. The signs and symptoms of potential                            delayed complications were discussed with the                            patient. Return to normal activities tomorrow.  Written discharge instructions were provided to the                            patient.                           - Resume previous diet.                           - Continue present medications.                           - Await pathology results.                           - No aspirin, ibuprofen, naproxen, or other                            non-steroidal anti-inflammatory drugs for 2 weeks                            after polyp removal. Procedure Code(s):        --- Professional ---                           [phone], Colonoscopy, flexible; with removal of                            tumor(s), polyp(s), or other lesion(s) by snare                            technique                           45380, 59, Colonoscopy, flexible; with biopsy,                            single or multiple Diagnosis Code(s):        --- Professional ---                           D12.5, Benign neoplasm of sigmoid colon                           D12.3, Benign neoplasm of transverse colon (hepatic                            flexure or splenic flexure)                            D12.0, Benign neoplasm of cecum                           K64.0, First degree hemorrhoids                           R19.5, Other fecal abnormalities  D50.9, Iron deficiency anemia, unspecified                           K57.30, Diverticulosis of large intestine without                            perforation or abscess without bleeding CPT copyright 2017 American Medical Association. All rights reserved. The codes documented in this report are preliminary and upon coder review may  be revised to meet current compliance requirements. Ladene Artist, MD 09/26/2017 1:56:59 PM This report has been signed electronically. Number of Addenda: 0

## 2017-09-26 NOTE — Anesthesia Procedure Notes (Signed)
Procedure Name: MAC Date/Time: 09/26/2017 1:02 PM Performed by: [name], CRNA Pre-anesthesia Checklist: Patient identified, Emergency Drugs available, Suction available, Patient being monitored and Timeout performed Patient Re-evaluated:Patient Re-evaluated prior to induction Oxygen Delivery Method: Nasal cannula Induction Type: IV induction Placement Confirmation: positive ETCO2 and breath sounds checked- equal and bilateral Dental Injury: Teeth and Oropharynx as per pre-operative assessment

## 2017-09-27 ENCOUNTER — Telehealth: Payer: Self-pay | Admitting: *Deleted

## 2017-09-27 NOTE — Telephone Encounter (Signed)
Pt was on TCM report went to ED for low hemoglobin. Pt was kept for observation. CBC features hemoglobin of 6.8 with MCV of 66.1. Pt received 2 units of blood and was observed for ongoing evaluation. Pt was D/C 09/26/17 and have appt scheduled already w/GI for 10/24/17. Called pt to make f/u w/Dr./Plot no answer LMOM RTC.Marland KitchenJohny Chess

## 2017-09-28 NOTE — Discharge Summary (Signed)
Physician Discharge Summary  NORVEL WENKER MVE:[id number] DOB: 11/16/1940 DOA: 09/24/2017  PCP: Cassandria Anger, MD  Admit date: 09/24/2017 Discharge date: 09/28/2017  Admitted From: Home  Disposition:  Home   Recommendations for Outpatient Follow-up:  1. Follow up with PCP in 1-2 weeks 2. Please obtain BMP/CBC in one week 3. Please follow up on the following pending results:  Home Health: None  Equipment/Devices: None  Discharge Condition: Good  CODE STATUS: FULL Diet recommendation: Diabetic, cardiac  Brief/Interim Summary: Mr. Harkin is a 77 y.o. M with CAD, DM, HTN, chronic diastolic CHF, and chronic anemia who presents for exertional dyspnea, fatigue, now found to have low hemoglobin.  Patient reports the insidious development of progressive exertional dyspnea and fatigue over the past 4 months without cough, chest pain, fevers, or chills. He has noted slight swelling in the bilateral lower extremities, but no lower extremity tenderness. He denies melena or hematochezia, denies alcohol use, and denies using any NSAID. Last colonoscopy was 10 years ago. He takes a baby aspirin and PPI daily.    Symptomatic anemia Chronic blood loss anemia Ferritin and iron saturation studies strongly suggestive of iron deficiency anemia.  This appears to been the case back in 2017.  Appropriate post-transfusion improvement in Hgb to >8.  GI consulted.  Given IV iron, started on oral iron. Underwent EGD and colonoscopy by GI, only gastritis, negative for mass.  Discharged with GI follow up.    Chronic diastolic CHF Coronary artery disease secondary prevention Hypertension Continued on current regimen.  CKD stage III Creatinine at baseline, serum creatinine 1.4 at baseline.  Diabetes Well controlled.    Discharge Diagnoses:  Principal Problem:   Symptomatic anemia Active Problems:   Essential hypertension   Coronary atherosclerosis of artery bypass graft   DM (diabetes  mellitus), type 2 with peripheral vascular complications (HCC)   Positive occult stool blood test   CKD (chronic kidney disease), stage III (HCC)   Chronic diastolic CHF (congestive heart failure) (HCC)   Iron deficiency anemia   Benign neoplasm of sigmoid colon   Benign neoplasm of cecum   Benign neoplasm of transverse colon    Discharge Instructions  Discharge Instructions    Diet - low sodium heart healthy   Complete by:  As directed    Discharge instructions   Complete by:  As directed    From Dr. Loleta Books: You were admitted for anemia. It appeared this was from some slow bleeding from your stomach, from irritation of the stomach.  Increase your antacid for the next month:     Take omeprazole 40 mg once daily     Continue your ranitidine 300 mg daily       For the anemia:    Watch your bowel movements for blood or black and sticky bowel movement    Take iron daily (this may darken the stools somewhat, but won't make them sticky)    Take iron 2 times daily    Take iron as ferrous sulfate 325 mg    Take iron with orange juice or something else sticky    Take a stool softener with iron, because it can cause constipation, like docusate/Colace    Follow up with Dr. Lynne Leader office (the gastroenterologist) on July 18th as directed.   Follow up with Dr. Alain Marion in 1-2 weeks, call for an appointment.   Increase activity slowly   Complete by:  As directed      Allergies as of 09/26/2017  Reactions   Benazepril Shortness Of Breath   Cough, wheezing      Medication List    STOP taking these medications   ipratropium 0.03 % nasal spray Commonly known as:  ATROVENT     TAKE these medications   albuterol 108 (90 Base) MCG/ACT inhaler Commonly known as:  PROAIR HFA Inhale 2 puffs into the lungs 2 (two) times daily.   amLODipine 10 MG tablet Commonly known as:  NORVASC TAKE 1 TABLET BY MOUTH  DAILY What changed:    how much to take  how to take this  when  to take this   aspirin 81 MG EC tablet Take 81 mg by mouth daily.   beclomethasone 80 MCG/ACT inhaler Commonly known as:  QVAR Inhale 2 puffs into the lungs 2 (two) times daily.   carvedilol 25 MG tablet Commonly known as:  COREG TAKE 1 TABLET BY MOUTH TWO  TIMES DAILY WITH MEALS What changed:  See the new instructions.   Cholecalciferol 1000 units tablet Take 2,000 Units by mouth daily.   colchicine 0.6 MG tablet Take 1 tablet (0.6 mg total) by mouth 2 (two) times daily as needed. What changed:  reasons to take this   docusate sodium 100 MG capsule Commonly known as:  COLACE Take 1 capsule (100 mg total) by mouth 2 (two) times daily.   ferrous sulfate 325 (65 FE) MG tablet Take 1 tablet (325 mg total) by mouth daily.   furosemide 20 MG tablet Commonly known as:  LASIX Take 2 tablets (40 mg total) by mouth every morning.   guaiFENesin 600 MG 12 hr tablet Commonly known as:  MUCINEX Take 1 tablet (600 mg total) by mouth 2 (two) times daily as needed for cough or to loosen phlegm.   hydrALAZINE 50 MG tablet Commonly known as:  APRESOLINE TAKE 1 TABLET BY MOUTH 3  TIMES DAILY What changed:    how much to take  how to take this  when to take this   irbesartan 150 MG tablet Commonly known as:  AVAPRO Take 1 tablet (150 mg total) by mouth daily.   loperamide 2 MG capsule Commonly known as:  IMODIUM Take 1 capsule by mouth 4  times daily as needed for  diarrhea or loose stools   metFORMIN 750 MG 24 hr tablet Commonly known as:  GLUCOPHAGE XR Take 1 tablet (750 mg total) by mouth daily with breakfast.   omeprazole 40 MG capsule Commonly known as:  PRILOSEC Take 1 capsule (40 mg total) by mouth daily. What changed:    medication strength  how much to take   pravastatin 80 MG tablet Commonly known as:  PRAVACHOL TAKE 1 TABLET BY MOUTH AT  BEDTIME   ranitidine 300 MG tablet Commonly known as:  ZANTAC TAKE 1 TABLET BY MOUTH  EVERY EVENING   tadalafil 20  MG tablet Commonly known as:  CIALIS Take as directed What changed:    how much to take  how to take this  when to take this  reasons to take this  additional instructions   Vitamin B-12 1000 MCG Subl Place 1 tablet (1,000 mcg total) under the tongue daily.   zolpidem 5 MG tablet Commonly known as:  AMBIEN Take 1 tablet (5 mg total) by mouth at bedtime as needed for sleep. What changed:  when to take this      Follow-up Information    Esterwood, Amy S, PA-C Follow up on 10/24/2017.   Specialty:  Gastroenterology  Why:  9 AM follow up with Dr Lynne Leader PA.   Contact information: [place] [address] [phone]          Allergies  Allergen Reactions  . Benazepril Shortness Of Breath    Cough, wheezing    Consultations:  Gastroenterology, Velora Heckler   Procedures/Studies: Dg Chest 2 View  Result Date: 09/24/2017 CLINICAL DATA:  Shortness of breath beginning today. EXAM: CHEST - 2 VIEW COMPARISON:  09/23/2017 FINDINGS: Sternotomy wires unchanged. Lungs are adequately inflated without airspace consolidation or effusion. Cardiomediastinal silhouette and remainder of the exam is unchanged. IMPRESSION: No active cardiopulmonary disease. Electronically Signed   By: Marin Olp M.D.   On: 09/24/2017 19:24   Dg Chest 2 View  Result Date: 09/23/2017 CLINICAL DATA:  Shortness of breath with exertion EXAM: CHEST - 2 VIEW COMPARISON:  December 31, 2016 FINDINGS: There is slight atelectasis in the right base. The lungs elsewhere are clear. Heart is upper normal in size with pulmonary vascularity normal. No adenopathy. Patient is status post coronary artery bypass grafting. There is aortic atherosclerosis. No evident bone lesions. IMPRESSION: Slight right base atelectasis. No edema or consolidation. Stable cardiac silhouette. There is aortic atherosclerosis. Aortic Atherosclerosis (ICD10-I70.0). Electronically Signed   By: Lowella Grip III M.D.   On: 09/23/2017  13:11   EGD Findings: Localized moderate inflammation characterized by congestion (edema), friability, erythema and granularity was found on the lesser curvature of the gastric antrum. It was soft and appeared to be a thickened gastric fold. Biopsies were taken with a cold forceps for histology. A small hiatal hernia was present. The exam of the stomach was otherwise normal. The duodenal bulb and second portion of the duodenum were normal. - Normal esophagus. - Gastritis, thickened fold. Biopsied. - Small hiatal hernia. - Normal duodenal bulb and second portion of the duodenum.   Colonoscopy One 18 mm polyp in the sigmoid colon, removed with a hot snare. Resected and retrieved. - Two 4 to 5 mm polyps in the transverse colon and in the cecum, removed with a cold biopsy forceps. Resected and retrieved. - Mild diverticulosis in the left colon. There was no evidence of diverticular bleeding. - Internal hemorrhoids. - The examination was otherwise normal on direct and retroflexion views. Impression: - Repeat colonoscopy in 3 years for surveillance pending pathology review. - Patient has a contact number available for emergencies. The signs and symptoms of potential delayed complications were discussed with the patient. Return to normal activities tomorrow. Written discharge instructions were provided to the patient. - Resume previous diet. - Continue present medications. - Await pathology results. - No aspirin, ibuprofen, naproxen, or other non-steroidal anti-inflammatory drugs for 2 weeks after polyp removal.         Subjective: Feels well.  No dyspnea, fatigue.  No further melena or hematochezia.    Discharge Exam: Vitals:   09/26/17 1420 09/26/17 1425  BP: 115/87   Pulse: 60   Resp: 18   Temp:    SpO2: 96% 95%   Vitals:   09/26/17 1410 09/26/17 1415 09/26/17 1420 09/26/17 1425  BP: 128/61  115/87   Pulse: 62  60   Resp: (!) 25  18   Temp:      TempSrc:       SpO2: 93% 95% 96% 95%  Weight:      Height:        General: Pt is alert, awake, not in acute distress, sitting up in bed, NAD Cardiovascular: RRR, S1/S2 +,  no rubs, no gallops Respiratory: CTA bilaterally, no wheezing, no rhonchi Abdominal: Soft, NT, ND, bowel sounds + Extremities: no edema, no cyanosis    The results of significant diagnostics from this hospitalization (including imaging, microbiology, ancillary and laboratory) are listed below for reference.     Microbiology: No results found for this or any previous visit (from the past 240 hour(s)).   Labs: BNP (last 3 results) Recent Labs    09/24/17 1828  BNP 016.0*   Basic Metabolic Panel: Recent Labs  Lab 09/23/17 1114 09/24/17 1801 09/25/17 0713 09/26/17 0523  NA 144 144 144 141  K 4.0 4.2 3.7 3.3*  CL 108 109 110 106  CO2 27 25 26 26   GLUCOSE 182* 129* 122* 105*  BUN 19 15 12 10   CREATININE 1.37 1.40* 1.27* 1.27*  CALCIUM 9.6 9.5 9.1 9.1   Liver Function Tests: No results for input(s): AST, ALT, ALKPHOS, BILITOT, PROT, ALBUMIN in the last 168 hours. No results for input(s): LIPASE, AMYLASE in the last 168 hours. No results for input(s): AMMONIA in the last 168 hours. CBC: Recent Labs  Lab 09/23/17 1114 09/24/17 1801 09/25/17 0713 09/26/17 0523  WBC 8.1 6.8 7.6 6.7  NEUTROABS 5.6  --   --   --   HGB 6.6 Repeated and verified X2.* 6.8* 8.6* 8.8*  HCT 21.9 Repeated and verified X2.* 26.1* 30.0* 30.8*  MCV 59.3 Repeated and verified X2.* 66.1* 69.9* 68.6*  PLT 246.0 305 233 220   Cardiac Enzymes: No results for input(s): CKTOTAL, CKMB, CKMBINDEX, TROPONINI in the last 168 hours. BNP: Invalid input(s): POCBNP CBG: Recent Labs  Lab 09/25/17 2014 09/25/17 2357 09/26/17 0446 09/26/17 0749 09/26/17 1147  GLUCAP 99 93 104* 114* 110*   D-Dimer No results for input(s): DDIMER in the last 72 hours. Hgb A1c No results for input(s): HGBA1C in the last 72 hours. Lipid Profile No results for  input(s): CHOL, HDL, LDLCALC, TRIG, CHOLHDL, LDLDIRECT in the last 72 hours. Thyroid function studies No results for input(s): TSH, T4TOTAL, T3FREE, THYROIDAB in the last 72 hours.  Invalid input(s): FREET3 Anemia work up No results for input(s): VITAMINB12, FOLATE, FERRITIN, TIBC, IRON, RETICCTPCT in the last 72 hours. Urinalysis    Component Value Date/Time   COLORURINE YELLOW 01/30/2016 0848   APPEARANCEUR CLEAR 01/30/2016 0848   LABSPEC 1.020 01/30/2016 0848   PHURINE 6.0 01/30/2016 0848   GLUCOSEU NEGATIVE 01/30/2016 0848   HGBUR NEGATIVE 01/30/2016 0848   BILIRUBINUR NEGATIVE 01/30/2016 0848   KETONESUR NEGATIVE 01/30/2016 0848   PROTEINUR 100 (A) 08/13/2015 1040   UROBILINOGEN 0.2 01/30/2016 0848   NITRITE NEGATIVE 01/30/2016 0848   LEUKOCYTESUR NEGATIVE 01/30/2016 0848   Sepsis Labs Invalid input(s): PROCALCITONIN,  WBC,  LACTICIDVEN Microbiology No results found for this or any previous visit (from the past 240 hour(s)).   Time coordinating discharge: 25 minutes       SIGNED:   Edwin Dada, MD  [facility] 09/26/17, 7:36 PM

## 2017-09-29 NOTE — Anesthesia Postprocedure Evaluation (Signed)
Anesthesia Post Note  Patient: Harold Pittman  Procedure(s) Performed: ESOPHAGOGASTRODUODENOSCOPY (EGD) WITH PROPOFOL (N/A ) COLONOSCOPY WITH PROPOFOL (N/A ) BIOPSY POLYPECTOMY     Patient location during evaluation: Endoscopy Anesthesia Type: MAC Level of consciousness: awake and alert Pain management: pain level controlled Vital Signs Assessment: post-procedure vital signs reviewed and stable Respiratory status: spontaneous breathing, nonlabored ventilation, respiratory function stable and patient connected to nasal cannula oxygen Cardiovascular status: stable and blood pressure returned to baseline Postop Assessment: no apparent nausea or vomiting Anesthetic complications: no    Last Vitals:  Vitals:   09/26/17 1420 09/26/17 1425  BP: 115/87   Pulse: 60   Resp: 18   Temp:    SpO2: 96% 95%    Last Pain:  Vitals:   09/26/17 1400  TempSrc: Oral  PainSc: 0-No pain   Pain Goal:                 Harold Pittman

## 2017-09-30 NOTE — Telephone Encounter (Signed)
Called pt again to make hosp f/u appt completed TCM call below.Harold Pittman  Transition Care Management Follow-up Telephone Call   Date discharged? 09/26/17   How have you been since you were released from the hospital? Pt states he is doing alright   Do you understand why you were in the hospital? YES   Do you understand the discharge instructions? YES   Where were you discharged to? Home   Items Reviewed:  Medications reviewed: YES  Allergies reviewed: YES  Dietary changes reviewed: YES, diabetic diet  Referrals reviewed: YES, will be f/u w/[facility] 10/24/17   Functional Questionnaire:   Activities of Daily Living (ADLs):   He states he are independent in the following: ambulation, bathing and hygiene, feeding, continence, grooming, toileting and dressing States he doesn't require assistance    Any transportation issues/concerns?: NO   Any patient concerns? NO   Confirmed importance and date/time of follow-up visits scheduled YES, appt 10/04/17  Provider Appointment booked with Dr. Alain Marion  Confirmed with patient if condition begins to worsen call PCP or go to the ER.  Patient was given the office number and encouraged to call back with question or concerns.  : YES

## 2017-10-04 ENCOUNTER — Encounter: Payer: Self-pay | Admitting: Internal Medicine

## 2017-10-04 ENCOUNTER — Ambulatory Visit: Payer: Medicare Other | Admitting: Internal Medicine

## 2017-10-04 DIAGNOSIS — I10 Essential (primary) hypertension: Secondary | ICD-10-CM

## 2017-10-04 DIAGNOSIS — L723 Sebaceous cyst: Secondary | ICD-10-CM | POA: Insufficient documentation

## 2017-10-04 DIAGNOSIS — D649 Anemia, unspecified: Secondary | ICD-10-CM

## 2017-10-04 DIAGNOSIS — I5032 Chronic diastolic (congestive) heart failure: Secondary | ICD-10-CM | POA: Diagnosis not present

## 2017-10-04 NOTE — Patient Instructions (Addendum)
[place]ot S - if constipated  Skin bx w/me   Epidermal Cyst An epidermal cyst is a small, painless lump under your skin. It may be called an epidermal inclusion cyst or an infundibular cyst. The cyst contains a grayish-white, bad-smelling substance (keratin). It is important not to pop epidermal cysts yourself. These cysts are usually harmless (benign), but they can get infected. Symptoms of infection may include:  Redness.  Inflammation.  Tenderness.  Warmth.  Fever.  A grayish-white, bad-smelling substance draining from the cyst.  Pus draining from the cyst.  Follow these instructions at home:  Take over-the-counter and prescription medicines only as told by your doctor.  If you were prescribed an antibiotic, use it as told by your doctor. Do not stop using the antibiotic even if you start to feel better.  Keep the area around your cyst clean and dry.  Wear loose, dry clothing.  Do not try to pop your cyst.  Avoid touching your cyst.  Check your cyst every day for signs of infection.  Keep all follow-up visits as told by your doctor. This is important. How is this prevented?  Wear clean, dry, clothing.  Avoid wearing tight clothing.  Keep your skin clean and dry. Shower or take baths every day.  Wash your body with a benzoyl peroxide wash when you shower or bathe. Contact a health care provider if:  Your cyst has symptoms of infection.  Your condition is not improving or is getting worse.  You have a cyst that looks different from other cysts you have had.  You have a fever. Get help right away if:  Redness spreads from the cyst into the surrounding area. This information is not intended to replace advice given to you by your health care provider. Make sure you discuss any questions you have with your health care provider. Document Released: 05/03/2004 Document Revised: 11/23/2015 Document Reviewed: 01/26/2015 Elsevier Interactive Patient Education   [employer].

## 2017-10-04 NOTE — Assessment & Plan Note (Addendum)
Improved w/anemia treatment Coreg, Hydralazine, Lasix Rx  Irbesartan

## 2017-10-04 NOTE — Assessment & Plan Note (Signed)
Coreg, Hydralazine, Lasix Rx  Irbesartan

## 2017-10-04 NOTE — Assessment & Plan Note (Signed)
Skin bx 

## 2017-10-04 NOTE — Progress Notes (Signed)
Subjective:  Patient ID: Harold Pittman, male    DOB: 1941-03-08  Age: 77 y.o. MRN: [id number]  CC: No chief complaint on file.   HPI Harold Pittman presents for hosp stay f/u - d/c'd on 6/20 - GI bleed, anemia, CHF  Per hx:  "Brief/Interim Summary: Harold Pittman a 77 y.o.Mwith CAD, DM, HTN, chronic diastolic CHF, and chronic anemia who presents for exertional dyspnea, fatigue, now found to have low hemoglobin.Patient reports the insidious development of progressive exertional dyspnea and fatigue over the past 4 months without cough, chest pain, fevers, or chills. He has noted slight swelling in the bilateral lower extremities, but no lower extremity tenderness. He denies melena or hematochezia, denies alcohol use, and denies using any NSAID. Last colonoscopy was 10 years ago. He takes a baby aspirin and PPI daily.    Symptomatic anemia Chronic blood loss anemia Ferritin and iron saturation studies strongly suggestive of iron deficiency anemia. This appears to been the case back in 2017. Appropriate post-transfusion improvement in Hgb to >8.  GI consulted.  Given IV iron, started on oral iron. Underwent EGD and colonoscopy by GI, only gastritis, negative for mass.  Discharged with GI follow up.    Chronic diastolic CHF Coronary artery disease secondary prevention Hypertension Continued on current regimen.  CKD stage III Creatinine at baseline, serum creatinine 1.4 at baseline.  Diabetes Well controlled."       Outpatient Medications Prior to Visit  Medication Sig Dispense Refill  . albuterol (PROAIR HFA) 108 (90 BASE) MCG/ACT inhaler Inhale 2 puffs into the lungs 2 (two) times daily. 1 Inhaler 11  . amLODipine (NORVASC) 10 MG tablet TAKE 1 TABLET BY MOUTH  DAILY (Patient taking differently: TAKE 1 TABLET(10mg ) BY MOUTH  DAILY) 90 tablet 3  . aspirin 81 MG EC tablet Take 81 mg by mouth daily.      . beclomethasone (QVAR) 80 MCG/ACT inhaler Inhale 2  puffs into the lungs 2 (two) times daily. 1 Inhaler 11  . carvedilol (COREG) 25 MG tablet TAKE 1 TABLET BY MOUTH TWO  TIMES DAILY WITH MEALS (Patient taking differently: TAKE 1 TABLET(25mg ) BY MOUTH TWO  TIMES DAILY WITH MEALS) 180 tablet 3  . Cholecalciferol 1000 UNITS tablet Take 2,000 Units by mouth daily.      . colchicine 0.6 MG tablet Take 1 tablet (0.6 mg total) by mouth 2 (two) times daily as needed. (Patient taking differently: Take 0.6 mg by mouth 2 (two) times daily as needed (For gout). ) 60 tablet 3  . Cyanocobalamin (VITAMIN B-12) 1000 MCG SUBL Place 1 tablet (1,000 mcg total) under the tongue daily. 100 tablet 3  . docusate sodium (COLACE) 100 MG capsule Take 1 capsule (100 mg total) by mouth 2 (two) times daily. 10 capsule 0  . ferrous sulfate 325 (65 FE) MG tablet Take 1 tablet (325 mg total) by mouth daily. 90 tablet 3  . furosemide (LASIX) 20 MG tablet Take 2 tablets (40 mg total) by mouth every morning. 180 tablet 3  . guaiFENesin (MUCINEX) 600 MG 12 hr tablet Take 1 tablet (600 mg total) by mouth 2 (two) times daily as needed for cough or to loosen phlegm. 14 tablet 0  . hydrALAZINE (APRESOLINE) 50 MG tablet TAKE 1 TABLET BY MOUTH 3  TIMES DAILY (Patient taking differently: TAKE 1 TABLET(50mg ) BY MOUTH 3  TIMES DAILY) 270 tablet 3  . irbesartan (AVAPRO) 150 MG tablet Take 1 tablet (150 mg total) by mouth daily. 30 tablet  11  . loperamide (IMODIUM) 2 MG capsule Take 1 capsule by mouth 4  times daily as needed for  diarrhea or loose stools 180 capsule 3  . metFORMIN (GLUCOPHAGE XR) 750 MG 24 hr tablet Take 1 tablet (750 mg total) by mouth daily with breakfast. 30 tablet 11  . omeprazole (PRILOSEC) 40 MG capsule Take 1 capsule (40 mg total) by mouth daily. 30 capsule 0  . pravastatin (PRAVACHOL) 80 MG tablet TAKE 1 TABLET BY MOUTH AT  BEDTIME 90 tablet 3  . ranitidine (ZANTAC) 300 MG tablet TAKE 1 TABLET BY MOUTH  EVERY EVENING 90 tablet 3  . tadalafil (CIALIS) 20 MG tablet Take as  directed (Patient taking differently: Take 20 mg by mouth daily as needed for erectile dysfunction. ) 10 tablet 5  . zolpidem (AMBIEN) 5 MG tablet Take 1 tablet (5 mg total) by mouth at bedtime as needed for sleep. (Patient taking differently: Take 5 mg by mouth at bedtime. ) 30 tablet 5   No facility-administered medications prior to visit.     ROS: Review of Systems  Constitutional: Negative for appetite change, fatigue and unexpected weight change.  HENT: Negative for congestion, nosebleeds, sneezing, sore throat and trouble swallowing.   Eyes: Negative for itching and visual disturbance.  Respiratory: Negative for cough.   Cardiovascular: Negative for chest pain, palpitations and leg swelling.  Gastrointestinal: Negative for abdominal distention, blood in stool, diarrhea and nausea.  Genitourinary: Negative for frequency and hematuria.  Musculoskeletal: Negative for back pain, gait problem, joint swelling and neck pain.  Skin: Negative for rash.  Neurological: Negative for dizziness, tremors, speech difficulty and weakness.  Psychiatric/Behavioral: Negative for agitation, dysphoric mood and sleep disturbance. The patient is not nervous/anxious.     Objective:  BP 132/68 (BP Location: Left Arm, Patient Position: Sitting, Cuff Size: Large)   Pulse 63   Temp 97.8 F (36.6 C) (Oral)   Ht 6' (1.829 m)   Wt 188 lb (85.3 kg)   SpO2 98%   BMI 25.50 kg/m   BP Readings from Last 3 Encounters:  10/04/17 132/68  09/26/17 115/87  09/23/17 132/64    Wt Readings from Last 3 Encounters:  10/04/17 188 lb (85.3 kg)  09/26/17 185 lb (83.9 kg)  09/23/17 194 lb (88 kg)    Physical Exam  Constitutional: He is oriented to person, place, and time. He appears well-developed. No distress.  NAD  HENT:  Mouth/Throat: Oropharynx is clear and moist.  Eyes: Pupils are equal, round, and reactive to light. Conjunctivae are normal.  Neck: Normal range of motion. No JVD present. No thyromegaly  present.  Cardiovascular: Normal rate, regular rhythm, normal heart sounds and intact distal pulses. Exam reveals no gallop and no friction rub.  No murmur heard. Pulmonary/Chest: Effort normal and breath sounds normal. No respiratory distress. He has no wheezes. He has no rales. He exhibits no tenderness.  Abdominal: Soft. Bowel sounds are normal. He exhibits no distension and no mass. There is no tenderness. There is no rebound and no guarding.  Musculoskeletal: Normal range of motion. He exhibits no edema or tenderness.  Lymphadenopathy:    He has no cervical adenopathy.  Neurological: He is alert and oriented to person, place, and time. He has normal reflexes. No cranial nerve deficit. He exhibits normal muscle tone. He displays a negative Romberg sign. Coordination and gait normal.  Skin: Skin is warm and dry. No rash noted.  Psychiatric: He has a normal mood and affect. His behavior  is normal. Judgment and thought content normal.    Lab Results  Component Value Date   WBC 6.7 09/26/2017   HGB 8.8 (L) 09/26/2017   HCT 30.8 (L) 09/26/2017   PLT 220 09/26/2017   GLUCOSE 105 (H) 09/26/2017   CHOL 102 07/25/2017   TRIG 78.0 07/25/2017   HDL 33.20 (L) 07/25/2017   LDLCALC 53 07/25/2017   ALT 12 (L) 07/23/2015   AST 15 07/23/2015   NA 141 09/26/2017   K 3.3 (L) 09/26/2017   CL 106 09/26/2017   CREATININE 1.27 (H) 09/26/2017   BUN 10 09/26/2017   CO2 26 09/26/2017   TSH 0.91 10/08/2011   PSA 1.31 03/22/2010   INR 1.35 07/23/2015   HGBA1C 6.7 (H) 07/25/2017   MICROALBUR 38.7 (H) 11/24/2013    Dg Chest 2 View  Result Date: 09/24/2017 CLINICAL DATA:  Shortness of breath beginning today. EXAM: CHEST - 2 VIEW COMPARISON:  09/23/2017 FINDINGS: Sternotomy wires unchanged. Lungs are adequately inflated without airspace consolidation or effusion. Cardiomediastinal silhouette and remainder of the exam is unchanged. IMPRESSION: No active cardiopulmonary disease. Electronically Signed    By: Marin Olp M.D.   On: 09/24/2017 19:24    Assessment & Plan:   There are no diagnoses linked to this encounter.   No orders of the defined types were placed in this encounter.    Follow-up: No follow-ups on file.  Walker Kehr, MD

## 2017-10-04 NOTE — Assessment & Plan Note (Signed)
RTC 6 wks Labs On po iron

## 2017-10-08 DIAGNOSIS — E119 Type 2 diabetes mellitus without complications: Secondary | ICD-10-CM | POA: Diagnosis not present

## 2017-10-08 DIAGNOSIS — H04123 Dry eye syndrome of bilateral lacrimal glands: Secondary | ICD-10-CM | POA: Diagnosis not present

## 2017-10-08 DIAGNOSIS — H10413 Chronic giant papillary conjunctivitis, bilateral: Secondary | ICD-10-CM | POA: Diagnosis not present

## 2017-10-08 DIAGNOSIS — H2513 Age-related nuclear cataract, bilateral: Secondary | ICD-10-CM | POA: Diagnosis not present

## 2017-10-08 LAB — HM DIABETES EYE EXAM

## 2017-10-24 ENCOUNTER — Other Ambulatory Visit (INDEPENDENT_AMBULATORY_CARE_PROVIDER_SITE_OTHER): Payer: Medicare Other

## 2017-10-24 ENCOUNTER — Encounter: Payer: Self-pay | Admitting: Physician Assistant

## 2017-10-24 ENCOUNTER — Ambulatory Visit (INDEPENDENT_AMBULATORY_CARE_PROVIDER_SITE_OTHER): Payer: Medicare Other | Admitting: Physician Assistant

## 2017-10-24 VITALS — BP 144/72 | HR 61 | Ht 73.0 in | Wt 191.0 lb

## 2017-10-24 DIAGNOSIS — I5032 Chronic diastolic (congestive) heart failure: Secondary | ICD-10-CM

## 2017-10-24 DIAGNOSIS — D509 Iron deficiency anemia, unspecified: Secondary | ICD-10-CM | POA: Diagnosis not present

## 2017-10-24 DIAGNOSIS — R195 Other fecal abnormalities: Secondary | ICD-10-CM | POA: Diagnosis not present

## 2017-10-24 DIAGNOSIS — D649 Anemia, unspecified: Secondary | ICD-10-CM | POA: Diagnosis not present

## 2017-10-24 DIAGNOSIS — D131 Benign neoplasm of stomach: Secondary | ICD-10-CM

## 2017-10-24 DIAGNOSIS — Z86010 Personal history of colonic polyps: Secondary | ICD-10-CM

## 2017-10-24 LAB — BASIC METABOLIC PANEL
BUN: 12 mg/dL (ref 6–23)
CALCIUM: 9.3 mg/dL (ref 8.4–10.5)
CO2: 34 meq/L — AB (ref 19–32)
Chloride: 105 mEq/L (ref 96–112)
Creatinine, Ser: 1.19 mg/dL (ref 0.40–1.50)
GFR: 76.33 mL/min (ref >60.00)
Glucose, Bld: 137 mg/dL — ABNORMAL HIGH (ref 70–99)
Potassium: 4.2 mEq/L (ref 3.5–5.1)
SODIUM: 145 meq/L (ref 135–145)

## 2017-10-24 LAB — CBC WITH DIFFERENTIAL/PLATELET
Basophils Absolute: 0 10*3/uL (ref 0.0–0.1)
Basophils Relative: 0.5 % (ref 0.0–3.0)
EOS ABS: 0.2 10*3/uL (ref 0.0–0.7)
EOS PCT: 2.6 % (ref 0.0–5.0)
HCT: 36.3 % — ABNORMAL LOW (ref 39.0–52.0)
Hemoglobin: 11.6 g/dL — ABNORMAL LOW (ref 13.0–17.0)
LYMPHS ABS: 1.8 10*3/uL (ref 0.7–4.0)
Lymphocytes Relative: 27.2 % (ref 12.0–46.0)
MCHC: 32.1 g/dL (ref 30.0–36.0)
MCV: 73.2 fl — ABNORMAL LOW (ref 78.0–100.0)
MONO ABS: 0.7 10*3/uL (ref 0.1–1.0)
Monocytes Relative: 10.8 % (ref 3.0–12.0)
NEUTROS PCT: 58.9 % (ref 43.0–77.0)
Neutro Abs: 3.9 10*3/uL (ref 1.4–7.7)
Platelets: 189 10*3/uL (ref 150.0–400.0)
RBC: 4.95 Mil/uL (ref 4.22–5.81)
RDW: 31.7 % — ABNORMAL HIGH (ref 11.5–15.5)
WBC: 6.6 10*3/uL (ref 4.0–10.5)

## 2017-10-24 NOTE — Patient Instructions (Signed)
CAPSOC[facility] CAPSULE ENDOSCOPY PATIENT INSTRUCTION Harold Pittman 1940-07-04 [id number]   1. 10-28-2017 Seven (7) days prior to capsule endoscopy stop taking iron supplements and carafate.  2. 11-04-2017 Two (2) days prior to capsule endoscopy stop taking aspirin or any arthritis drugs.  3. 11-03-2017 Day before capsule endoscopy purchase a 238 gram bottle of Miralax from the laxative section of your drug store, and a 32 oz. bottle of Gatorade (no red).    4. 11-03-2017 One (1) day prior to capsule endoscopy: a) Stop smoking. b) Eat a regular diet until 12:00 Noon. c) After 12:00 Noon take only the following: Black coffee  Jell-O (no fruit or red Jell-o) Water   Bouillon (chicken or beef) 7-Up   Cranberry Juice Tea   Kool-Aid Popsicle (not red) Sprite   Coke Ginger Ale  Pepsi Mountain Dew Gatorade d) At 6:00 pm the evening before your appointment, drink 7 capfuls (105 grams) of Miralax with 32 oz. Gatorade. Drink 8 oz every 15 minutes until gone. e) Nothing to eat or drink after midnight except medications with a sip of water.  5. 11-04-2017 Day of capsule endoscopy:            Do not have anything to eat or drink after midnight No medications for 2 hours prior to your test.  6. Please arrive at  [facility]  3rd floor patient registration area by 7:55 am on: 11-04-2017.   For any questions: Call [facility] at [phone] and ask to speak with one of the capsule endoscopy nurses.      The above instructions have been reviewed and explained to me by________________   Patient signature:_________________________________________     Date:________________        What you should know.   You have been scheduled for a Capsule Endoscopy with Capsocam.  You will swallow a vitamin size pill that contains cameras that will take pictures of your small intestine (bowel).  Your small bowel connects to your stomach on one end and the large intestine or bowel on the  other. The capsule moves through the gastrointestinal tract taking pictures along the way. The images are stored on the capsule.  1-5 days after the capsule ingestion you will retrieve the capsule and mail it in a prepaid envelope to [facility] to download the images.  Your physician will be provided a copy of the images to review.   Who should have a capsule endoscopy?  You may need a small bowel capsule endoscopy if you have symptoms, such as blood in your stool, chronic pain, diarrhea, or unexplained anemia. The small intestine is a hollow organ that cannot be easily visualized with a scope due to its length.  The capsule endoscopy allows your physician to directly visualize the intestine.  The pictures may show intestine growths, inflammation, or bleeding in the small intestine.   What are the risks with a capsule endoscopy?  The pictures may not be clear or give [place] a clear cause of your symptoms The capsule may become trapped in the esophagus or intestines.  You may need surgery or endoscopic procedures to remove the capsule. You may not have a MRI until the capsule endoscopy has been retrieved.   How do I retrieve the capsule?  You will be provided a kit with step-by-step instructions for capsule retrieval and mailing.  You should expect to retrieve the capsule in 1-5 days after ingestion.  This will depend on your individual bowel habits.  Continue Omeprazole 40 mg by mouth every morning. Increase iron to twice daily.  Keep appointment with Dr. Mansoweraty on 12-12-2017.    

## 2017-10-24 NOTE — Progress Notes (Signed)
Subjective:    Patient ID: [name], male    DOB: Nov 08, 1940, 77 y.o.   MRN: [id number]  HPI Gray is a pleasant 77 year old African-American male, established with Dr. Fuller Plan after being seen recently during hospitalization.  He has history of hypertension, coronary artery disease, adult onset diabetes mellitus, congestive heart failure, COPD, GERD and history of adenomatous colon polyps. He was admitted 6/18 through 09/26/2017 with complaints of worsening shortness of breath and weakness and was found to have a hemoglobin of 6.6, ferritin of 4 and was heme positive.  He had blood transfusions, he did not receive IV iron.  He has been placed on oral iron.  He underwent colonoscopy and EGD with Dr. Fuller Plan on 09/26/2017.  Colonoscopy revealed an 18 mm sigmoid colon polyp and 2 other smaller polyps in the transverse and descending colon.  Path on all of these consistent with tubular adenomas and he is indicated for 3-year interval follow-up. At EGD he had moderate gastritis and a thickened prepyloric fold which did not appear ulcerated.  This was biopsied and biopsies show a gastric adenoma. Patient is currently awaiting an appointment with Dr. Rush Landmark to discuss eventual EMR of this large prepyloric gastric adenoma. On 09/26/2017 hemoglobin 8.8 hematocrit of 30.9. Says since hospitalization he has been feeling better, he denies any problems with dizziness lightheadedness or shortness of breath currently.  Appetite has been good, he denies any abdominal discomfort.  Bowel movements have been normal, perhaps somewhat darker since starting iron.  Review of Systems Pertinent positive and negative review of systems were noted in the above HPI section.  All other review of systems was otherwise negative.  Outpatient Encounter Medications as of 10/24/2017  Medication Sig  . albuterol (PROAIR HFA) 108 (90 BASE) MCG/ACT inhaler Inhale 2 puffs into the lungs 2 (two) times daily.  Marland Kitchen amLODipine (NORVASC)  10 MG tablet TAKE 1 TABLET BY MOUTH  DAILY (Patient taking differently: TAKE 1 TABLET(10mg ) BY MOUTH  DAILY)  . aspirin 81 MG EC tablet Take 81 mg by mouth daily.    . beclomethasone (QVAR) 80 MCG/ACT inhaler Inhale 2 puffs into the lungs 2 (two) times daily.  . carvedilol (COREG) 25 MG tablet TAKE 1 TABLET BY MOUTH TWO  TIMES DAILY WITH MEALS (Patient taking differently: TAKE 1 TABLET(25mg ) BY MOUTH TWO  TIMES DAILY WITH MEALS)  . Cholecalciferol 1000 UNITS tablet Take 2,000 Units by mouth daily.    . colchicine 0.6 MG tablet Take 1 tablet (0.6 mg total) by mouth 2 (two) times daily as needed. (Patient taking differently: Take 0.6 mg by mouth 2 (two) times daily as needed (For gout). )  . Cyanocobalamin (VITAMIN B-12) 1000 MCG SUBL Place 1 tablet (1,000 mcg total) under the tongue daily.  Marland Kitchen docusate sodium (COLACE) 100 MG capsule Take 1 capsule (100 mg total) by mouth 2 (two) times daily.  . ferrous sulfate 325 (65 FE) MG tablet Take 1 tablet (325 mg total) by mouth daily.  . furosemide (LASIX) 20 MG tablet Take 2 tablets (40 mg total) by mouth every morning.  Marland Kitchen guaiFENesin (MUCINEX) 600 MG 12 hr tablet Take 1 tablet (600 mg total) by mouth 2 (two) times daily as needed for cough or to loosen phlegm.  . hydrALAZINE (APRESOLINE) 50 MG tablet TAKE 1 TABLET BY MOUTH 3  TIMES DAILY (Patient taking differently: TAKE 1 TABLET(50mg ) BY MOUTH 3  TIMES DAILY)  . irbesartan (AVAPRO) 150 MG tablet Take 1 tablet (150 mg total) by mouth  daily.  . loperamide (IMODIUM) 2 MG capsule Take 1 capsule by mouth 4  times daily as needed for  diarrhea or loose stools  . metFORMIN (GLUCOPHAGE XR) 750 MG 24 hr tablet Take 1 tablet (750 mg total) by mouth daily with breakfast.  . omeprazole (PRILOSEC) 40 MG capsule Take 1 capsule (40 mg total) by mouth daily.  . pravastatin (PRAVACHOL) 80 MG tablet TAKE 1 TABLET BY MOUTH AT  BEDTIME  . ranitidine (ZANTAC) 300 MG tablet TAKE 1 TABLET BY MOUTH  EVERY EVENING  . tadalafil  (CIALIS) 20 MG tablet Take as directed (Patient taking differently: Take 20 mg by mouth daily as needed for erectile dysfunction. )  . zolpidem (AMBIEN) 5 MG tablet Take 1 tablet (5 mg total) by mouth at bedtime as needed for sleep. (Patient taking differently: Take 5 mg by mouth at bedtime. )   No facility-administered encounter medications on file as of 10/24/2017.    Allergies  Allergen Reactions  . Benazepril Shortness Of Breath    Cough, wheezing   Patient Active Problem List   Diagnosis Date Noted  . Sebaceous cyst 10/04/2017  . Iron deficiency anemia   . Benign neoplasm of sigmoid colon   . Benign neoplasm of cecum   . Benign neoplasm of transverse colon   . Positive occult stool blood test 09/24/2017  . CKD (chronic kidney disease), stage III ([place]) 09/24/2017  . Chronic diastolic CHF (congestive heart failure) ([place]) 09/24/2017  . Symptomatic anemia 08/17/2015  . Generalized weakness 07/22/2015  . Gait disorder 03/18/2014  . Low back pain 01/12/2014  . History of colon polyps 12/17/2013  . DM (diabetes mellitus), type 2 with peripheral vascular complications ([place]) 96/78/9381  . Diarrhea 10/08/2012  . DOE (dyspnea on exertion) 06/29/2011  . LATERAL EPICONDYLITIS, LEFT 02/20/2010  . TOBACCO USE, QUIT 02/23/2009  . Cough 02/16/2009  . DECREASED HEARING 10/15/2008  . ERECTILE DYSFUNCTION 04/30/2008  . 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 COPD (chronic obstructive pulmonary disease) ([place]) 05/21/2007  . Anxiety state 05/20/2007  . INSOMNIA, PERSISTENT 03/04/2007  . Dyslipidemia 02/01/2007  . Gout 02/01/2007  . Essential hypertension 02/01/2007  . GERD 02/01/2007  . Osteoarthritis 02/01/2007   Social History   Socioeconomic History  . Marital status: Widowed    Spouse name: Not on file  . Number of children: Not on file  . Years of education: Not on file  . Highest education level: Not on file  Occupational History  . Occupation: RETIRED    Social Needs  . Financial resource strain: Not on file  . Food insecurity:    Worry: Not on file    Inability: Not on file  . Transportation needs:    Medical: Not on file    Non-medical: Not on file  Tobacco Use  . Smoking status: Former Research scientist (life sciences)  . Smokeless tobacco: Never Used  . Tobacco comment: Stopped 1997  Substance and Sexual Activity  . Alcohol use: No    Comment: Stopped 1997  . Drug use: No  . Sexual activity: Not Currently  Lifestyle  . Physical activity:    Days per week: Not on file    Minutes per session: Not on file  . Stress: Not on file  Relationships  . Social connections:    Talks on phone: Not on file    Gets together: Not on file    Attends religious service: Not on file    Active member of club or organization: Not  on file    Attends meetings of clubs or organizations: Not on file    Relationship status: Not on file  . Intimate partner violence:    Fear of current or ex partner: Not on file    Emotionally abused: Not on file    Physically abused: Not on file    Forced sexual activity: Not on file  Other Topics Concern  . Not on file  Social History Narrative   Patient does not get regular exercise   Daily Caffeine use - 6      Wife decease 12-Jul-2005;        Mr. Gilson family history includes Coronary artery disease in his other; Depression in his sister; Heart disease in his sister; Heart disease (age of onset: 58) in his mother; Hypertension in his other; Mental illness in his father.      Objective:    Vitals:   10/24/17 1001  BP: (!) 144/72  Pulse: 61    Physical Exam; well-developed elderly African-American male in no acute distress, very pleasant blood pressure 144/72 pulse 61, height 6 foot 1, weight 191, BMI 25.2.  HEENT; nontraumatic normocephalic EOMI PERRLA sclera anicteric, oropharynx benign, Cardiovascular; regular rate and rhythm with S1-S2 no murmur rub or gallop, Pulmonary; clear bilaterally, Abdomen ;soft nontender  nondistended bowel sounds are active there is no palpable mass or hepatosplenomegaly, Rectal; exam not done, Ext; no clubbing cyanosis or edema skin warm dry,Neuro psych; alert and oriented, mood and affect appropriate grossly nonfocal       Assessment & Plan:   #43 77 year old African-American male with recent admission with weakness and shortness of breath felt secondary to severe iron deficiency anemia.  Patient required transfusions, was found to be heme positive and underwent endoscopic evaluation without clear source for his iron deficiency anemia. 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  This was not ulcerated, not felt definitely to be the source for his iron deficiency anemia.  This lesion was not removed.  #4 hypertension #5.  Coronary artery disease #6.  Adult onset diabetes mellitus #7.  Congestive heart failure #8.  Diverticulosis  Plan; Repeat CBC today Increase ferrous sulfate 25 mg to 1 p.o. twice daily. If hemoglobin has not improved any since hospitalization he may benefit from IV iron infusions. We discussed continued avoidance of aspirin and NSAIDs Patient will be scheduled for Capsule endoscopy.  Procedure was discussed in detail with the patient today including indications risks and benefits and he is agreeable to proceed. Patient has appointment scheduled with Dr. Rush Landmark on September 5 to discuss potential EMR of the gastric adenoma. Patient will need follow-up colonoscopy in 3 years per Dr. Fuller Plan.   Danyon Mcginness S Curby Carswell PA-C 10/24/2017   Cc: Plotnikov, Evie Lacks, MD

## 2017-10-25 ENCOUNTER — Other Ambulatory Visit: Payer: Self-pay

## 2017-10-25 DIAGNOSIS — D509 Iron deficiency anemia, unspecified: Secondary | ICD-10-CM

## 2017-10-25 LAB — IRON,TIBC AND FERRITIN PANEL
%SAT: 27 % (ref 20–48)
FERRITIN: 32 ng/mL (ref 24–380)
IRON: 96 ug/dL (ref 50–180)
TIBC: 358 ug/dL (ref 250–425)

## 2017-10-27 NOTE — Progress Notes (Signed)
Reviewed and agree with management plan.  Nikolaos Maddocks T. Shalunda Lindh, MD FACG 

## 2017-10-28 ENCOUNTER — Encounter: Payer: Medicare Other | Admitting: Internal Medicine

## 2017-10-29 ENCOUNTER — Encounter: Payer: Self-pay | Admitting: Internal Medicine

## 2017-11-04 ENCOUNTER — Ambulatory Visit: Payer: Medicare Other | Admitting: Cardiovascular Disease

## 2017-11-04 ENCOUNTER — Telehealth: Payer: Self-pay | Admitting: Physician Assistant

## 2017-11-04 ENCOUNTER — Encounter: Payer: Self-pay | Admitting: Gastroenterology

## 2017-11-04 ENCOUNTER — Encounter

## 2017-11-04 NOTE — Telephone Encounter (Signed)
Patient wanted to review the instructions given to him at his office visit for the Capsule Endoscopy.

## 2017-11-04 NOTE — Telephone Encounter (Signed)
No answer. No voicemail. 

## 2017-11-04 NOTE — Telephone Encounter (Signed)
error 

## 2017-11-06 ENCOUNTER — Ambulatory Visit (INDEPENDENT_AMBULATORY_CARE_PROVIDER_SITE_OTHER): Payer: Self-pay | Admitting: Physician Assistant

## 2017-11-06 ENCOUNTER — Encounter: Payer: Self-pay | Admitting: Gastroenterology

## 2017-11-06 DIAGNOSIS — D509 Iron deficiency anemia, unspecified: Secondary | ICD-10-CM

## 2017-11-06 DIAGNOSIS — R195 Other fecal abnormalities: Secondary | ICD-10-CM

## 2017-11-06 NOTE — Progress Notes (Signed)
Patient here for capsule endo.  He tolerated well and verbalized understanding of written and verbal instructions.  Capsule ID [id number] Exp 02/14/2019

## 2017-11-06 NOTE — Patient Instructions (Signed)

## 2017-11-07 ENCOUNTER — Telehealth: Payer: Self-pay | Admitting: Physician Assistant

## 2017-11-07 NOTE — Telephone Encounter (Signed)
Called back to the patient. Got voicemail. Left a message advising he return to his regular medication schedule and his regular diet.

## 2017-11-15 ENCOUNTER — Ambulatory Visit: Payer: Medicare Other | Admitting: Internal Medicine

## 2017-11-18 ENCOUNTER — Ambulatory Visit: Payer: Medicare Other | Admitting: Internal Medicine

## 2017-11-18 ENCOUNTER — Other Ambulatory Visit (INDEPENDENT_AMBULATORY_CARE_PROVIDER_SITE_OTHER): Payer: Medicare Other

## 2017-11-18 ENCOUNTER — Encounter: Payer: Self-pay | Admitting: Internal Medicine

## 2017-11-18 DIAGNOSIS — D500 Iron deficiency anemia secondary to blood loss (chronic): Secondary | ICD-10-CM

## 2017-11-18 DIAGNOSIS — I10 Essential (primary) hypertension: Secondary | ICD-10-CM

## 2017-11-18 DIAGNOSIS — E1151 Type 2 diabetes mellitus with diabetic peripheral angiopathy without gangrene: Secondary | ICD-10-CM

## 2017-11-18 DIAGNOSIS — L723 Sebaceous cyst: Secondary | ICD-10-CM | POA: Diagnosis not present

## 2017-11-18 DIAGNOSIS — N183 Chronic kidney disease, stage 3 unspecified: Secondary | ICD-10-CM

## 2017-11-18 LAB — BASIC METABOLIC PANEL
BUN: 14 mg/dL (ref 6–23)
CALCIUM: 9.3 mg/dL (ref 8.4–10.5)
CO2: 29 mEq/L (ref 19–32)
Chloride: 105 mEq/L (ref 96–112)
Creatinine, Ser: 1.34 mg/dL (ref 0.40–1.50)
GFR: 66.55 mL/min (ref >60.00)
Glucose, Bld: 267 mg/dL — ABNORMAL HIGH (ref 70–99)
POTASSIUM: 3.5 meq/L (ref 3.5–5.1)
SODIUM: 142 meq/L (ref 135–145)

## 2017-11-18 LAB — CBC WITH DIFFERENTIAL/PLATELET
Basophils Absolute: 0 10*3/uL (ref 0.0–0.1)
Basophils Relative: 0.6 % (ref 0.0–3.0)
EOS PCT: 3.5 % (ref 0.0–5.0)
Eosinophils Absolute: 0.2 10*3/uL (ref 0.0–0.7)
HCT: 34.3 % — ABNORMAL LOW (ref 39.0–52.0)
HEMOGLOBIN: 11.6 g/dL — AB (ref 13.0–17.0)
Lymphocytes Relative: 27.3 % (ref 12.0–46.0)
Lymphs Abs: 1.4 10*3/uL (ref 0.7–4.0)
MCHC: 33.7 g/dL (ref 30.0–36.0)
MCV: 78.5 fl (ref 78.0–100.0)
MONO ABS: 0.5 10*3/uL (ref 0.1–1.0)
Monocytes Relative: 10.1 % (ref 3.0–12.0)
Neutro Abs: 3.1 10*3/uL (ref 1.4–7.7)
Neutrophils Relative %: 58.5 % (ref 43.0–77.0)
Platelets: 164 10*3/uL (ref 150.0–400.0)
RBC: 4.38 Mil/uL (ref 4.22–5.81)
RDW: 28 % — ABNORMAL HIGH (ref 11.5–15.5)
WBC: 5.2 10*3/uL (ref 4.0–10.5)

## 2017-11-18 LAB — HEMOGLOBIN A1C: Hgb A1c MFr Bld: 5.8 % (ref 4.6–6.5)

## 2017-11-18 MED ORDER — IRBESARTAN 150 MG PO TABS: 150.0000 mg | ORAL_TABLET | Freq: Every day | ORAL | 3 refills | Status: DC

## 2017-11-18 MED ORDER — METFORMIN HCL ER 750 MG PO TB24: 750.0000 mg | ORAL_TABLET | Freq: Every day | ORAL | 3 refills | Status: DC

## 2017-11-18 NOTE — Assessment & Plan Note (Signed)
L lower back seb cyst - sch procedure

## 2017-11-18 NOTE — Assessment & Plan Note (Signed)
Metformin, Pravastatin, ASA, Coreg

## 2017-11-18 NOTE — Patient Instructions (Signed)
Skin bx w/me 

## 2017-11-18 NOTE — Assessment & Plan Note (Signed)
CBC

## 2017-11-18 NOTE — Assessment & Plan Note (Signed)
Careful w/Metformin, Irbesartan Labs

## 2017-11-18 NOTE — Assessment & Plan Note (Signed)
Coreg, Hydralazine, Lasix  Irbesartan

## 2017-11-18 NOTE — Progress Notes (Signed)
Subjective:  Patient ID: Harold Pittman, male    DOB: 07-Apr-1941  Age: 77 y.o. MRN: [id number]  CC: No chief complaint on file.   HPI Harold Pittman presents for anemia, DM, CHF Feeling well  Outpatient Medications Prior to Visit  Medication Sig Dispense Refill  . albuterol (PROAIR HFA) 108 (90 BASE) MCG/ACT inhaler Inhale 2 puffs into the lungs 2 (two) times daily. 1 Inhaler 11  . amLODipine (NORVASC) 10 MG tablet TAKE 1 TABLET BY MOUTH  DAILY (Patient taking differently: TAKE 1 TABLET(10mg ) BY MOUTH  DAILY) 90 tablet 3  . aspirin 81 MG EC tablet Take 81 mg by mouth daily.      . beclomethasone (QVAR) 80 MCG/ACT inhaler Inhale 2 puffs into the lungs 2 (two) times daily. 1 Inhaler 11  . carvedilol (COREG) 25 MG tablet TAKE 1 TABLET BY MOUTH TWO  TIMES DAILY WITH MEALS (Patient taking differently: TAKE 1 TABLET(25mg ) BY MOUTH TWO  TIMES DAILY WITH MEALS) 180 tablet 3  . Cholecalciferol 1000 UNITS tablet Take 2,000 Units by mouth daily.      . colchicine 0.6 MG tablet Take 1 tablet (0.6 mg total) by mouth 2 (two) times daily as needed. (Patient taking differently: Take 0.6 mg by mouth 2 (two) times daily as needed (For gout). ) 60 tablet 3  . Cyanocobalamin (VITAMIN B-12) 1000 MCG SUBL Place 1 tablet (1,000 mcg total) under the tongue daily. 100 tablet 3  . docusate sodium (COLACE) 100 MG capsule Take 1 capsule (100 mg total) by mouth 2 (two) times daily. 10 capsule 0  . ferrous sulfate 325 (65 FE) MG tablet Take 1 tablet (325 mg total) by mouth daily. 90 tablet 3  . furosemide (LASIX) 20 MG tablet Take 2 tablets (40 mg total) by mouth every morning. 180 tablet 3  . guaiFENesin (MUCINEX) 600 MG 12 hr tablet Take 1 tablet (600 mg total) by mouth 2 (two) times daily as needed for cough or to loosen phlegm. 14 tablet 0  . hydrALAZINE (APRESOLINE) 50 MG tablet TAKE 1 TABLET BY MOUTH 3  TIMES DAILY (Patient taking differently: TAKE 1 TABLET(50mg ) BY MOUTH 3  TIMES DAILY) 270 tablet 3  .  loperamide (IMODIUM) 2 MG capsule Take 1 capsule by mouth 4  times daily as needed for  diarrhea or loose stools 180 capsule 3  . omeprazole (PRILOSEC) 40 MG capsule Take 1 capsule (40 mg total) by mouth daily. 30 capsule 0  . pravastatin (PRAVACHOL) 80 MG tablet TAKE 1 TABLET BY MOUTH AT  BEDTIME 90 tablet 3  . ranitidine (ZANTAC) 300 MG tablet TAKE 1 TABLET BY MOUTH  EVERY EVENING 90 tablet 3  . tadalafil (CIALIS) 20 MG tablet Take as directed (Patient taking differently: Take 20 mg by mouth daily as needed for erectile dysfunction. ) 10 tablet 5  . zolpidem (AMBIEN) 5 MG tablet Take 1 tablet (5 mg total) by mouth at bedtime as needed for sleep. (Patient taking differently: Take 5 mg by mouth at bedtime. ) 30 tablet 5  . irbesartan (AVAPRO) 150 MG tablet Take 1 tablet (150 mg total) by mouth daily. 30 tablet 11  . metFORMIN (GLUCOPHAGE XR) 750 MG 24 hr tablet Take 1 tablet (750 mg total) by mouth daily with breakfast. 30 tablet 11   No facility-administered medications prior to visit.     ROS: Review of Systems  Constitutional: Negative for appetite change, fatigue and unexpected weight change.  HENT: Negative for congestion, nosebleeds, sneezing,  sore throat and trouble swallowing.   Eyes: Negative for itching and visual disturbance.  Respiratory: Negative for cough.   Cardiovascular: Negative for chest pain, palpitations and leg swelling.  Gastrointestinal: Negative for abdominal distention, blood in stool, diarrhea and nausea.  Genitourinary: Negative for frequency and hematuria.  Musculoskeletal: Positive for arthralgias. Negative for back pain, gait problem, joint swelling and neck pain.  Skin: Negative for rash.  Neurological: Negative for dizziness, tremors, speech difficulty and weakness.  Psychiatric/Behavioral: Negative for agitation, dysphoric mood and sleep disturbance. The patient is not nervous/anxious.     Objective:  BP 136/72 (BP Location: Left Arm, Patient Position:  Sitting, Cuff Size: Large)   Pulse (!) 58   Temp 98 F (36.7 C) (Oral)   Ht 6\' 1"  (1.854 m)   Wt 193 lb (87.5 kg)   SpO2 96%   BMI 25.46 kg/m   BP Readings from Last 3 Encounters:  11/18/17 136/72  10/24/17 (!) 144/72  10/04/17 132/68    Wt Readings from Last 3 Encounters:  11/18/17 193 lb (87.5 kg)  10/24/17 191 lb (86.6 kg)  10/04/17 188 lb (85.3 kg)    Physical Exam  Constitutional: He is oriented to person, place, and time. He appears well-developed. No distress.  NAD  HENT:  Mouth/Throat: Oropharynx is clear and moist.  Eyes: Pupils are equal, round, and reactive to light. Conjunctivae are normal.  Neck: Normal range of motion. No JVD present. No thyromegaly present.  Cardiovascular: Normal rate, regular rhythm, normal heart sounds and intact distal pulses. Exam reveals no gallop and no friction rub.  No murmur heard. Pulmonary/Chest: Effort normal and breath sounds normal. No respiratory distress. He has no wheezes. He has no rales. He exhibits no tenderness.  Abdominal: Soft. Bowel sounds are normal. He exhibits no distension and no mass. There is no tenderness. There is no rebound and no guarding.  Musculoskeletal: Normal range of motion. He exhibits no edema or tenderness.  Lymphadenopathy:    He has no cervical adenopathy.  Neurological: He is alert and oriented to person, place, and time. He has normal reflexes. No cranial nerve deficit. He exhibits normal muscle tone. He displays a negative Romberg sign. Coordination and gait normal.  Skin: Skin is warm and dry. No rash noted.  Psychiatric: He has a normal mood and affect. His behavior is normal. Judgment and thought content normal.  L lower back seb cyst  Lab Results  Component Value Date   WBC 6.6 10/24/2017   HGB 11.6 (L) 10/24/2017   HCT 36.3 (L) 10/24/2017   PLT 189.0 10/24/2017   GLUCOSE 137 (H) 10/24/2017   CHOL 102 07/25/2017   TRIG 78.0 07/25/2017   HDL 33.20 (L) 07/25/2017   LDLCALC 53  07/25/2017   ALT 12 (L) 07/23/2015   AST 15 07/23/2015   NA 145 10/24/2017   K 4.2 10/24/2017   CL 105 10/24/2017   CREATININE 1.19 10/24/2017   BUN 12 10/24/2017   CO2 34 (H) 10/24/2017   TSH 0.91 10/08/2011   PSA 1.31 03/22/2010   INR 1.35 07/23/2015   HGBA1C 6.7 (H) 07/25/2017   MICROALBUR 38.7 (H) 11/24/2013    Dg Chest 2 View  Result Date: 09/24/2017 CLINICAL DATA:  Shortness of breath beginning today. EXAM: CHEST - 2 VIEW COMPARISON:  09/23/2017 FINDINGS: Sternotomy wires unchanged. Lungs are adequately inflated without airspace consolidation or effusion. Cardiomediastinal silhouette and remainder of the exam is unchanged. IMPRESSION: No active cardiopulmonary disease. Electronically Signed   By: Quillian Quince  Derrel Nip M.D.   On: 09/24/2017 19:24    Assessment & Plan:   There are no diagnoses linked to this encounter.   Meds ordered this encounter  Medications  . irbesartan (AVAPRO) 150 MG tablet    Sig: Take 1 tablet (150 mg total) by mouth daily.    Dispense:  90 tablet    Refill:  3  . metFORMIN (GLUCOPHAGE XR) 750 MG 24 hr tablet    Sig: Take 1 tablet (750 mg total) by mouth daily with breakfast.    Dispense:  90 tablet    Refill:  3     Follow-up: No follow-ups on file.  Walker Kehr, MD

## 2017-11-22 ENCOUNTER — Other Ambulatory Visit: Payer: Medicare Other

## 2017-11-22 ENCOUNTER — Ambulatory Visit: Payer: Medicare Other | Admitting: Internal Medicine

## 2017-11-22 ENCOUNTER — Encounter: Payer: Self-pay | Admitting: Internal Medicine

## 2017-11-22 DIAGNOSIS — L723 Sebaceous cyst: Secondary | ICD-10-CM

## 2017-11-22 DIAGNOSIS — L729 Follicular cyst of the skin and subcutaneous tissue, unspecified: Secondary | ICD-10-CM | POA: Diagnosis not present

## 2017-11-22 DIAGNOSIS — L988 Other specified disorders of the skin and subcutaneous tissue: Secondary | ICD-10-CM | POA: Diagnosis not present

## 2017-11-22 NOTE — Progress Notes (Signed)
Procedure: skin lesion excision Indication - painful cyst Lesion on the lower thoracic back measuring 11x9 mm.  Skin over the lesion was prepped with Betadine and alcohol and anesthetized with 3 cc of 2% lidocaine and epinephrine, using a 25-gauge 1 inch needle. Excisional biopsy with a sterile #11 blade was carried out in the usual fashion.A cyst with dark content was excised. Wound was irrigated. Then it was closed with a skin glue. Band-Aid was applied.

## 2017-11-22 NOTE — Assessment & Plan Note (Signed)
See procedure 

## 2017-11-22 NOTE — Patient Instructions (Signed)
Keep the wound clean

## 2017-12-03 ENCOUNTER — Telehealth: Payer: Self-pay

## 2017-12-03 NOTE — Telephone Encounter (Signed)
Left message for patient to call back  

## 2017-12-03 NOTE — Telephone Encounter (Signed)
-----   Message from Alfredia Ferguson, PA-C sent at 12/03/2017 12:00 PM EDT ----- Regarding: Harold Pittman, please call pt and let him know the capsule endoscopy was negative other than the large  adenoma that was seen on endoscopy - this lesion is ulcerated so it may be causing him to ooze blood slowly over time . He is scheduled to See Dr Rush Landmark  next week in office to discuss removal of this lesion.   He should also come in for repeat CBC

## 2017-12-05 NOTE — Telephone Encounter (Signed)
Left message for patient to call back  

## 2017-12-11 NOTE — Telephone Encounter (Signed)
Left message for patient to call back  

## 2017-12-12 ENCOUNTER — Ambulatory Visit: Payer: Medicare Other | Admitting: Gastroenterology

## 2017-12-12 ENCOUNTER — Encounter: Payer: Self-pay | Admitting: Gastroenterology

## 2017-12-12 NOTE — Progress Notes (Signed)
No show letter mailed to patient. 

## 2017-12-13 NOTE — Telephone Encounter (Signed)
Unable to reach the patient.  Letter was mailed.

## 2017-12-25 ENCOUNTER — Ambulatory Visit (INDEPENDENT_AMBULATORY_CARE_PROVIDER_SITE_OTHER): Payer: Medicare Other | Admitting: Gastroenterology

## 2017-12-25 ENCOUNTER — Encounter: Payer: Medicare Other | Admitting: Internal Medicine

## 2017-12-25 ENCOUNTER — Encounter: Payer: Self-pay | Admitting: Gastroenterology

## 2017-12-25 VITALS — BP 128/60 | HR 72 | Ht 71.75 in | Wt 194.0 lb

## 2017-12-25 DIAGNOSIS — D131 Benign neoplasm of stomach: Secondary | ICD-10-CM

## 2017-12-25 DIAGNOSIS — R198 Other specified symptoms and signs involving the digestive system and abdomen: Secondary | ICD-10-CM

## 2017-12-25 DIAGNOSIS — K3189 Other diseases of stomach and duodenum: Secondary | ICD-10-CM | POA: Diagnosis not present

## 2017-12-25 DIAGNOSIS — K2970 Gastritis, unspecified, without bleeding: Secondary | ICD-10-CM

## 2017-12-25 DIAGNOSIS — Z0289 Encounter for other administrative examinations: Secondary | ICD-10-CM

## 2017-12-25 NOTE — Progress Notes (Signed)
[facility] VISIT   Primary Care Provider Plotnikov, Evie Lacks, MD [address] [place] [address] [phone]  Referring Provider Ladene Artist, MD [address], [place] [address] [phone]  Patient Profile: Harold Pittman is a 77 y.o. male with a pmh significant for CAD, COPD/Asthma, GERD, OA, DM, HTN, Gout, Tubular Adenomas of the Colon, Tubular Adenoma of the Stomach, GIM.  The patient presents to the [facility] for an evaluation and management of problem(s) noted below:  Problem List 1. Adenoma of stomach   2. Gastritis with intestinal metaplasia of stomach   3. Abnormal findings on esophagogastroduodenoscopy (EGD)     History of Present Illness: This is a patient's follow-up visit to the [facility] to discuss the findings of gastric adenoma and consideration of endoscopic management options for this.  The patient was undergoing an evaluation earlier this summer for heme positive stools and anemia and underwent an upper and lower endoscopy as well as a capsule endoscopy.  On his upper endoscopy was found to have an antral adenoma.  Patient has no overt manifestations of GI complaints currently.  He missed my appointment a few weeks ago but was happy to reschedule.  He is not had any overt GI bleeding in regards to melena/hematochezia/maroon stools or coffee-ground emesis/hematemesis.  GI Review of Systems Positive as above including GERD symptoms Negative for dysphasia, odynophagia, change in bowel habits  Review of Systems General: Denies fevers/chills/weight loss HEENT: Denies oral lesions Cardiovascular: Denies chest pain Pulmonary: Denies shortness of breath Gastroenterological: See HPI Genitourinary: Denies darkened urine Hematological: Denies easy bruising/bleeding Dermatological: Denies jaundice Psychological: Mood is stable   Medications Current Outpatient Medications  Medication Sig  Dispense Refill  . albuterol (PROAIR HFA) 108 (90 BASE) MCG/ACT inhaler Inhale 2 puffs into the lungs 2 (two) times daily. 1 Inhaler 11  . amLODipine (NORVASC) 10 MG tablet TAKE 1 TABLET BY MOUTH  DAILY (Patient taking differently: TAKE 1 TABLET(10mg ) BY MOUTH  DAILY) 90 tablet 3  . aspirin 81 MG EC tablet Take 81 mg by mouth daily.      . beclomethasone (QVAR) 80 MCG/ACT inhaler Inhale 2 puffs into the lungs 2 (two) times daily. 1 Inhaler 11  . carvedilol (COREG) 25 MG tablet TAKE 1 TABLET BY MOUTH TWO  TIMES DAILY WITH MEALS (Patient taking differently: TAKE 1 TABLET(25mg ) BY MOUTH TWO  TIMES DAILY WITH MEALS) 180 tablet 3  . Cholecalciferol 1000 UNITS tablet Take 2,000 Units by mouth daily.      . colchicine 0.6 MG tablet Take 1 tablet (0.6 mg total) by mouth 2 (two) times daily as needed. (Patient taking differently: Take 0.6 mg by mouth 2 (two) times daily as needed (For gout). ) 60 tablet 3  . Cyanocobalamin (VITAMIN B-12) 1000 MCG SUBL Place 1 tablet (1,000 mcg total) under the tongue daily. 100 tablet 3  . docusate sodium (COLACE) 100 MG capsule Take 1 capsule (100 mg total) by mouth 2 (two) times daily. 10 capsule 0  . ferrous sulfate 325 (65 FE) MG tablet Take 1 tablet (325 mg total) by mouth daily. 90 tablet 3  . furosemide (LASIX) 20 MG tablet Take 2 tablets (40 mg total) by mouth every morning. 180 tablet 3  . guaiFENesin (MUCINEX) 600 MG 12 hr tablet Take 1 tablet (600 mg total) by mouth 2 (two) times daily as needed for cough or to loosen phlegm. 14 tablet 0  . hydrALAZINE (APRESOLINE) 50 MG  tablet TAKE 1 TABLET BY MOUTH 3  TIMES DAILY (Patient taking differently: TAKE 1 TABLET(50mg ) BY MOUTH 3  TIMES DAILY) 270 tablet 3  . irbesartan (AVAPRO) 150 MG tablet Take 1 tablet (150 mg total) by mouth daily. 90 tablet 3  . loperamide (IMODIUM) 2 MG capsule Take 1 capsule by mouth 4  times daily as needed for  diarrhea or loose stools 180 capsule 3  . metFORMIN (GLUCOPHAGE XR) 750 MG 24 hr  tablet Take 1 tablet (750 mg total) by mouth daily with breakfast. 90 tablet 3  . omeprazole (PRILOSEC) 40 MG capsule Take 1 capsule (40 mg total) by mouth daily. 30 capsule 0  . pravastatin (PRAVACHOL) 80 MG tablet TAKE 1 TABLET BY MOUTH AT  BEDTIME 90 tablet 3  . ranitidine (ZANTAC) 300 MG tablet TAKE 1 TABLET BY MOUTH  EVERY EVENING 90 tablet 3  . tadalafil (CIALIS) 20 MG tablet Take as directed (Patient taking differently: Take 20 mg by mouth daily as needed for erectile dysfunction. ) 10 tablet 5  . zolpidem (AMBIEN) 5 MG tablet Take 1 tablet (5 mg total) by mouth at bedtime as needed for sleep. (Patient taking differently: Take 5 mg by mouth at bedtime. ) 30 tablet 5   No current facility-administered medications for this visit.     Allergies Allergies  Allergen Reactions  . Benazepril Shortness Of Breath    Cough, wheezing    Histories Past Medical History:  Diagnosis Date  . Anxiety state, unspecified   . Blood in stool   . Chronic airway obstruction, not elsewhere classified   . Coronary atherosclerosis of artery bypass graft   . ED (erectile dysfunction)   . Esophageal reflux   . Gout, unspecified   . Osteoarthrosis, unspecified whether generalized or localized, unspecified site   . Other and unspecified hyperlipidemia   . Other specified disorder of rectum and anus   . Personal history of tobacco use, presenting hazards to health   . Type II or unspecified type diabetes mellitus without mention of complication, not stated as uncontrolled   . Unspecified essential hypertension    Past Surgical History:  Procedure Laterality Date  . BIOPSY  09/26/2017   Procedure: BIOPSY;  Surgeon: Ladene Artist, MD;  Location: [facility];  Service: Endoscopy;;  . COLONOSCOPY WITH PROPOFOL N/A 09/26/2017   Procedure: COLONOSCOPY WITH PROPOFOL;  Surgeon: Ladene Artist, MD;  Location: [facility] ENDOSCOPY;  Service: Endoscopy;  Laterality: N/A;  . CORONARY ARTERY BYPASS GRAFT  97   LIMA  to LAD, sequential saphenous vein graft to the first and second diagonal, sequential saphenous vein graft to the intermediate OM and circumflex and SVG to RCA  . ESOPHAGOGASTRODUODENOSCOPY (EGD) WITH PROPOFOL N/A 09/26/2017   Procedure: ESOPHAGOGASTRODUODENOSCOPY (EGD) WITH PROPOFOL;  Surgeon: Ladene Artist, MD;  Location: [facility] ENDOSCOPY;  Service: Endoscopy;  Laterality: N/A;  . KNEE SURGERY     BILATERAL  . POLYPECTOMY  09/26/2017   Procedure: POLYPECTOMY;  Surgeon: Ladene Artist, MD;  Location: [facility] ENDOSCOPY;  Service: Endoscopy;;  . ROTATOR CUFF REPAIR     Social History   Socioeconomic History  . Marital status: Widowed    Spouse name: Not on file  . Number of children: Not on file  . Years of education: Not on file  . Highest education level: Not on file  Occupational History  . Occupation: RETIRED  Social Needs  . Financial resource strain: Not on file  . Food insecurity:  Worry: Not on file    Inability: Not on file  . Transportation needs:    Medical: Not on file    Non-medical: Not on file  Tobacco Use  . Smoking status: Former Research scientist (life sciences)  . Smokeless tobacco: Never Used  . Tobacco comment: Stopped 1997  Substance and Sexual Activity  . Alcohol use: No    Comment: Stopped 1997  . Drug use: No  . Sexual activity: Not Currently  Lifestyle  . Physical activity:    Days per week: Not on file    Minutes per session: Not on file  . Stress: Not on file  Relationships  . Social connections:    Talks on phone: Not on file    Gets together: Not on file    Attends religious service: Not on file    Active member of club or organization: Not on file    Attends meetings of clubs or organizations: Not on file    Relationship status: Not on file  . Intimate partner violence:    Fear of current or ex partner: Not on file    Emotionally abused: Not on file    Physically abused: Not on file    Forced sexual activity: Not on file  Other Topics Concern  . Not on file    Social History Narrative   Patient does not get regular exercise   Daily Caffeine use - 6      Wife decease 07/18/2005;       Family History  Problem Relation Age of Onset  . Depression Sister   . Heart disease Sister   . Heart disease Mother 28       CAD  . Mental illness Father        Alzheimer's  . Hypertension Other   . Coronary artery disease Other        Male 1st degree relative <50  . Colon cancer Neg Hx   . Stomach cancer Neg Hx    I have reviewed his medical, social, and family history in detail and updated the electronic medical record as necessary.    PHYSICAL EXAMINATION  BP 128/60 (BP Location: Left Arm, Patient Position: Sitting, Cuff Size: Normal)   Pulse 72   Ht 5' 11.75" (1.822 m)   Wt 194 lb (88 kg)   BMI 26.49 kg/m  Wt Readings from Last 3 Encounters:  12/25/17 194 lb (88 kg)  11/22/17 191 lb (86.6 kg)  11/18/17 193 lb (87.5 kg)  GEN: NAD, appears stated age, doesn't appear chronically ill, accompanied by daughter PSYCH: Cooperative, without pressured speech EYE: Conjunctivae pink, sclerae anicteric ENT: MMM, without oral ulcers NECK: Supple CV: RR without R/Gs  RESP: Decreased breath sounds at the bases bilaterally but no overt wheezing GI: NABS, soft, NT/ND, mildly protuberant, without rebound or guarding, no HSM appreciated MSK/EXT: No lower extremity edema SKIN: No jaundice NEURO:  Alert & Oriented x 3, no focal deficits   REVIEW OF DATA  I reviewed the following data at the time of this encounter:  GI Procedures and Studies  June 2019 upper endoscopy Normal esophagus. Localized moderate inflammation characterized by edema and congestion, friability, erythema, granularity found on the lesser curve of the gastric antrum.  This appeared soft and thickened.  Biopsies taken with cold forceps for histology.  Pathology returned as tubular adenoma. Small hiatal hernia.  Exam of stomach otherwise normal.  Duodenal bulb and second portion of duodenum  were normal.  June 2019 colonoscopy A 2 mm  polyp in the sigmoid colon that was pedunculated and removed via hot snare polypectomy.  2 sessile polyps in the transverse colon and cecum 4 to 5 mm in size removed with cold biopsy forceps.  Pathology of all polyps returned as tubular adenomas. Few small mouth diverticula in the left colon.  Internal hemorrhoids.  July 2019 video capsule endoscopy Good prep.  Prepyloric gastric adenoma visualized without active bleeding.  Small polypoid lesion at 7 minutes.  Otherwise negative study.  Laboratory Studies  Reviewed in epic  Imaging Studies  No relevant studies   ASSESSMENT  Mr. Nall is a 77 y.o. male with a pmh significant for CAD, COPD/Asthma, GERD, OA, DM, HTN, Gout, Tubular Adenomas of the Colon, Tubular Adenoma of the Stomach, GIM.  The patient is seen today for evaluation and management of:  1. Adenoma of stomach   2. Gastritis with intestinal metaplasia of stomach   3. Abnormal findings on esophagogastroduodenoscopy (EGD)    We briefly discussed the underlying pathogenesis of adenomas of the stomach.  We also discussed in brief intestinal metaplasia and how it can play rules for the development of gastric cancer in the future and that surveillance of this is necessary as well.  Based upon the description and endoscopic pictures I do feel that it is reasonable to pursue an Advanced Polypectomy attempt of the polyp/lesion.  We discussed some of the techniques of advanced polypectomy which include Endoscopic Mucosal Resection, OVESCO Full-Thickness Resection, Endorotor Morcellation, and Tissue Ablation via Fulguration.  The risks and benefits of endoscopic evaluation were discussed with the patient and his daughter; these include but are not limited to the risk of perforation, infection, bleeding, missed lesions, lack of diagnosis, severe illness requiring hospitalization, as well as anesthesia and sedation related illnesses.  During attempts at  advanced polypectomy, the risks of bleeding and perforation/leak are increased as opposed to diagnostic and screening endoscopies, and that was discussed with the patient as well.  I did offer, a referral to surgery as well to discuss consideration of a surgical intervention for prior to finalizing decision for attempt at endoscopic removal; the patient and daughter would like this to know all options.  If, after attempt at removal of the polyp, it is found that the patient has a complication or that an invasive lesion or malignant lesion is found, or that the polyp continues to recur, the patient is aware and understands that a surgery may still be indicated/required should we move forward with attempt at resection.  In addition, with the possible need for piecemeal resection, subsequent short-interval endoscopies for follow up and treatment of the lesion may be necessary and was also explained.  All patient questions were answered, to the best of my ability, and the patient and daughter agree to the aforementioned plan of action with follow-up as indicated.   PLAN  1. Adenoma of stomach - Plan to schedule EGD with EMR attempt in coming weeks (90 minutes) - Patient would like referral to surgery (Dr. Donne Hazel) to discuss possible role of surgical intervention - we will try to arrange this prior to my EMR should he choose this route (though suspect our surgical colleagues will want [place] to try endoscopic approach first)  2. Gastritis with intestinal metaplasia of stomach - At time of EMR will need to also do Gastric Mapping biopsies  3. Abnormal findings on esophagogastroduodenoscopy (EGD)   Orders Placed This Encounter  Procedures  . Ambulatory referral to Gastroenterology    New Prescriptions  No medications on file   Modified Medications   No medications on file    Planned Follow Up: No follow-ups on file.   Justice Britain, MD [facility]oscopy Office  # [phone]

## 2017-12-25 NOTE — Patient Instructions (Signed)
Normal BMI (Body Mass Index- based on height and weight) is between 23 and 30. Your BMI today is Body mass index is 26.49 kg/m. Marland Kitchen Please consider follow up  regarding your BMI with your Primary Care Provider.  You have been scheduled for an endoscopy at [facility]. Please follow written instructions given to you at your visit today. If you use inhalers (even only as needed), please bring them with you on the day of your procedure. Your physician has requested that you go to www.startemmi.com and enter the access code given to you at your visit today. This web site gives a general overview about your procedure. However, you should still follow specific instructions given to you by our office regarding your preparation for the procedure.

## 2017-12-27 ENCOUNTER — Encounter: Payer: Self-pay | Admitting: Gastroenterology

## 2017-12-27 DIAGNOSIS — K2970 Gastritis, unspecified, without bleeding: Secondary | ICD-10-CM | POA: Insufficient documentation

## 2017-12-27 DIAGNOSIS — K3189 Other diseases of stomach and duodenum: Secondary | ICD-10-CM

## 2018-01-07 DIAGNOSIS — D131 Benign neoplasm of stomach: Secondary | ICD-10-CM | POA: Diagnosis not present

## 2018-01-08 ENCOUNTER — Encounter: Payer: Self-pay | Admitting: Internal Medicine

## 2018-01-08 ENCOUNTER — Ambulatory Visit: Payer: Medicare Other | Admitting: Internal Medicine

## 2018-01-08 ENCOUNTER — Other Ambulatory Visit (INDEPENDENT_AMBULATORY_CARE_PROVIDER_SITE_OTHER): Payer: Medicare Other

## 2018-01-08 VITALS — BP 132/66 | HR 66 | Temp 98.4°F | Ht 71.75 in | Wt 199.0 lb

## 2018-01-08 DIAGNOSIS — I10 Essential (primary) hypertension: Secondary | ICD-10-CM | POA: Diagnosis not present

## 2018-01-08 DIAGNOSIS — Z23 Encounter for immunization: Secondary | ICD-10-CM

## 2018-01-08 DIAGNOSIS — R600 Localized edema: Secondary | ICD-10-CM

## 2018-01-08 DIAGNOSIS — R269 Unspecified abnormalities of gait and mobility: Secondary | ICD-10-CM | POA: Diagnosis not present

## 2018-01-08 LAB — BASIC METABOLIC PANEL
BUN: 10 mg/dL (ref 6–23)
CHLORIDE: 103 meq/L (ref 96–112)
CO2: 28 meq/L (ref 19–32)
CREATININE: 1.3 mg/dL (ref 0.40–1.50)
Calcium: 9.1 mg/dL (ref 8.4–10.5)
GFR: 68.89 mL/min (ref >60.00)
Glucose, Bld: 195 mg/dL — ABNORMAL HIGH (ref 70–99)
POTASSIUM: 3.4 meq/L — AB (ref 3.5–5.1)
SODIUM: 141 meq/L (ref 135–145)

## 2018-01-08 LAB — BRAIN NATRIURETIC PEPTIDE: PRO B NATRI PEPTIDE: 157 pg/mL — AB (ref 0.0–100.0)

## 2018-01-08 LAB — TSH: TSH: 0.74 u[IU]/mL (ref 0.35–4.50)

## 2018-01-08 MED ORDER — TORSEMIDE 100 MG PO TABS: 100.0000 mg | ORAL_TABLET | Freq: Every day | ORAL | 11 refills | Status: DC

## 2018-01-08 NOTE — Assessment & Plan Note (Signed)
Stop Furosemide Start Torsemide Reduce Amlodipine to 5 mg/day

## 2018-01-08 NOTE — Progress Notes (Signed)
Subjective:  Patient ID: Harold Pittman, male    DOB: 04-03-1941  Age: 77 y.o. MRN: [id number]  CC: No chief complaint on file.   HPI Harold Pittman presents for COPD, DM, CAD f/u C/o B LE swelling x 3 d. SOB is chronic   Outpatient Medications Prior to Visit  Medication Sig Dispense Refill  . albuterol (PROAIR HFA) 108 (90 BASE) MCG/ACT inhaler Inhale 2 puffs into the lungs 2 (two) times daily. 1 Inhaler 11  . amLODipine (NORVASC) 10 MG tablet TAKE 1 TABLET BY MOUTH  DAILY (Patient taking differently: TAKE 1 TABLET(10mg ) BY MOUTH  DAILY) 90 tablet 3  . aspirin 81 MG EC tablet Take 81 mg by mouth daily.      . beclomethasone (QVAR) 80 MCG/ACT inhaler Inhale 2 puffs into the lungs 2 (two) times daily. 1 Inhaler 11  . carvedilol (COREG) 25 MG tablet TAKE 1 TABLET BY MOUTH TWO  TIMES DAILY WITH MEALS (Patient taking differently: TAKE 1 TABLET(25mg ) BY MOUTH TWO  TIMES DAILY WITH MEALS) 180 tablet 3  . Cholecalciferol 1000 UNITS tablet Take 2,000 Units by mouth daily.      . colchicine 0.6 MG tablet Take 1 tablet (0.6 mg total) by mouth 2 (two) times daily as needed. (Patient taking differently: Take 0.6 mg by mouth 2 (two) times daily as needed (For gout). ) 60 tablet 3  . Cyanocobalamin (VITAMIN B-12) 1000 MCG SUBL Place 1 tablet (1,000 mcg total) under the tongue daily. 100 tablet 3  . docusate sodium (COLACE) 100 MG capsule Take 1 capsule (100 mg total) by mouth 2 (two) times daily. 10 capsule 0  . ferrous sulfate 325 (65 FE) MG tablet Take 1 tablet (325 mg total) by mouth daily. 90 tablet 3  . furosemide (LASIX) 20 MG tablet Take 2 tablets (40 mg total) by mouth every morning. 180 tablet 3  . guaiFENesin (MUCINEX) 600 MG 12 hr tablet Take 1 tablet (600 mg total) by mouth 2 (two) times daily as needed for cough or to loosen phlegm. 14 tablet 0  . hydrALAZINE (APRESOLINE) 50 MG tablet TAKE 1 TABLET BY MOUTH 3  TIMES DAILY (Patient taking differently: TAKE 1 TABLET(50mg ) BY MOUTH 3  TIMES  DAILY) 270 tablet 3  . irbesartan (AVAPRO) 150 MG tablet Take 1 tablet (150 mg total) by mouth daily. 90 tablet 3  . loperamide (IMODIUM) 2 MG capsule Take 1 capsule by mouth 4  times daily as needed for  diarrhea or loose stools 180 capsule 3  . metFORMIN (GLUCOPHAGE XR) 750 MG 24 hr tablet Take 1 tablet (750 mg total) by mouth daily with breakfast. 90 tablet 3  . omeprazole (PRILOSEC) 40 MG capsule Take 1 capsule (40 mg total) by mouth daily. 30 capsule 0  . pravastatin (PRAVACHOL) 80 MG tablet TAKE 1 TABLET BY MOUTH AT  BEDTIME 90 tablet 3  . ranitidine (ZANTAC) 300 MG tablet TAKE 1 TABLET BY MOUTH  EVERY EVENING 90 tablet 3  . tadalafil (CIALIS) 20 MG tablet Take as directed (Patient taking differently: Take 20 mg by mouth daily as needed for erectile dysfunction. ) 10 tablet 5  . zolpidem (AMBIEN) 5 MG tablet Take 1 tablet (5 mg total) by mouth at bedtime as needed for sleep. (Patient taking differently: Take 5 mg by mouth at bedtime. ) 30 tablet 5   No facility-administered medications prior to visit.     ROS: Review of Systems  Constitutional: Negative for appetite change, fatigue and  unexpected weight change.  HENT: Negative for congestion, nosebleeds, sneezing, sore throat and trouble swallowing.   Eyes: Negative for itching and visual disturbance.  Respiratory: Positive for shortness of breath. Negative for cough.   Cardiovascular: Positive for leg swelling. Negative for chest pain and palpitations.  Gastrointestinal: Negative for abdominal distention, blood in stool, diarrhea and nausea.  Genitourinary: Negative for frequency and hematuria.  Musculoskeletal: Negative for back pain, gait problem, joint swelling and neck pain.  Skin: Negative for rash.  Neurological: Positive for weakness. Negative for dizziness, tremors, syncope and speech difficulty.  Psychiatric/Behavioral: Negative for agitation, dysphoric mood, sleep disturbance and suicidal ideas. The patient is not  nervous/anxious.     Objective:  BP 132/66 (BP Location: Left Arm, Patient Position: Sitting, Cuff Size: Normal)   Pulse 66   Temp 98.4 F (36.9 C) (Oral)   Ht 5' 11.75" (1.822 m)   Wt 199 lb (90.3 kg)   SpO2 96%   BMI 27.18 kg/m   BP Readings from Last 3 Encounters:  01/08/18 132/66  12/25/17 128/60  11/22/17 138/80    Wt Readings from Last 3 Encounters:  01/08/18 199 lb (90.3 kg)  12/25/17 194 lb (88 kg)  11/22/17 191 lb (86.6 kg)    Physical Exam  Constitutional: He is oriented to person, place, and time. He appears well-developed. No distress.  NAD  HENT:  Mouth/Throat: Oropharynx is clear and moist.  Eyes: Pupils are equal, round, and reactive to light. Conjunctivae are normal.  Neck: Normal range of motion. No JVD present. No thyromegaly present.  Cardiovascular: Normal rate, regular rhythm, normal heart sounds and intact distal pulses. Exam reveals no gallop and no friction rub.  No murmur heard. Pulmonary/Chest: Effort normal and breath sounds normal. No respiratory distress. He has no wheezes. He has no rales. He exhibits no tenderness.  Abdominal: Soft. Bowel sounds are normal. He exhibits no distension and no mass. There is no tenderness. There is no rebound and no guarding.  Musculoskeletal: Normal range of motion. He exhibits edema. He exhibits no tenderness.  Lymphadenopathy:    He has no cervical adenopathy.  Neurological: He is alert and oriented to person, place, and time. He has normal reflexes. No cranial nerve deficit. He exhibits normal muscle tone. He displays a negative Romberg sign. Coordination abnormal. Gait normal.  Skin: Skin is warm and dry. No rash noted.  Psychiatric: He has a normal mood and affect. His behavior is normal. Judgment and thought content normal.   Trace to 1+ edema B Cane Ataxia   Lab Results  Component Value Date   WBC 5.2 11/18/2017   HGB 11.6 (L) 11/18/2017   HCT 34.3 (L) 11/18/2017   PLT 164.0 11/18/2017    GLUCOSE 267 (H) 11/18/2017   CHOL 102 07/25/2017   TRIG 78.0 07/25/2017   HDL 33.20 (L) 07/25/2017   LDLCALC 53 07/25/2017   ALT 12 (L) 07/23/2015   AST 15 07/23/2015   NA 142 11/18/2017   K 3.5 11/18/2017   CL 105 11/18/2017   CREATININE 1.34 11/18/2017   BUN 14 11/18/2017   CO2 29 11/18/2017   TSH 0.91 10/08/2011   PSA 1.31 03/22/2010   INR 1.35 07/23/2015   HGBA1C 5.8 11/18/2017   MICROALBUR 38.7 (H) 11/24/2013    Dg Chest 2 View  Result Date: 09/24/2017 CLINICAL DATA:  Shortness of breath beginning today. EXAM: CHEST - 2 VIEW COMPARISON:  09/23/2017 FINDINGS: Sternotomy wires unchanged. Lungs are adequately inflated without airspace consolidation or effusion.  Cardiomediastinal silhouette and remainder of the exam is unchanged. IMPRESSION: No active cardiopulmonary disease. Electronically Signed   By: Marin Olp M.D.   On: 09/24/2017 19:24    Assessment & Plan:   There are no diagnoses linked to this encounter.   No orders of the defined types were placed in this encounter.    Follow-up: No follow-ups on file.  Walker Kehr, MD

## 2018-01-08 NOTE — Addendum Note (Signed)
Addended by: Karren Cobble on: 01/08/2018 01:33 PM   Modules accepted: Orders

## 2018-01-08 NOTE — Patient Instructions (Signed)
Stop Furosemide Start Torsemide Reduce Amlodipine to 5 mg/day

## 2018-01-08 NOTE — Assessment & Plan Note (Signed)
[employer]

## 2018-01-08 NOTE — Assessment & Plan Note (Signed)
Stop Furosemide Start Torsemide Reduce Amlodipine to 5 mg/day Labs

## 2018-01-21 ENCOUNTER — Encounter: Payer: Self-pay | Admitting: *Deleted

## 2018-01-21 ENCOUNTER — Encounter: Payer: Self-pay | Admitting: Physician Assistant

## 2018-01-21 ENCOUNTER — Telehealth (HOSPITAL_COMMUNITY): Payer: Self-pay

## 2018-01-21 ENCOUNTER — Ambulatory Visit: Payer: Medicare Other | Admitting: Physician Assistant

## 2018-01-21 VITALS — BP 132/60 | HR 70 | Ht 71.0 in | Wt 191.0 lb

## 2018-01-21 DIAGNOSIS — R0600 Dyspnea, unspecified: Secondary | ICD-10-CM

## 2018-01-21 DIAGNOSIS — I10 Essential (primary) hypertension: Secondary | ICD-10-CM | POA: Diagnosis not present

## 2018-01-21 DIAGNOSIS — Z01818 Encounter for other preprocedural examination: Secondary | ICD-10-CM

## 2018-01-21 DIAGNOSIS — E785 Hyperlipidemia, unspecified: Secondary | ICD-10-CM | POA: Diagnosis not present

## 2018-01-21 DIAGNOSIS — I25700 Atherosclerosis of coronary artery bypass graft(s), unspecified, with unstable angina pectoris: Secondary | ICD-10-CM | POA: Diagnosis not present

## 2018-01-21 LAB — BASIC METABOLIC PANEL
BUN / CREAT RATIO: 12 (ref 10–24)
BUN: 16 mg/dL (ref 8–27)
CO2: 28 mmol/L (ref 20–29)
CREATININE: 1.38 mg/dL — AB (ref 0.76–1.27)
Calcium: 9.5 mg/dL (ref 8.6–10.2)
Chloride: 100 mmol/L (ref 96–106)
GFR calc Af Amer: 57 mL/min/{1.73_m2} — ABNORMAL LOW (ref >59)
GFR, EST NON AFRICAN AMERICAN: 49 mL/min/{1.73_m2} — AB (ref >59)
Glucose: 174 mg/dL — ABNORMAL HIGH (ref 65–99)
Potassium: 3.7 mmol/L (ref 3.5–5.2)
SODIUM: 147 mmol/L — AB (ref 134–144)

## 2018-01-21 MED ORDER — AMLODIPINE BESYLATE 5 MG PO TABS: 5.0000 mg | ORAL_TABLET | Freq: Every day | ORAL | Status: DC

## 2018-01-21 NOTE — Progress Notes (Signed)
Cardiology Office Note    Date:  01/21/2018   ID:  Harold Pittman, DOB 1940-04-12, MRN [id number]  PCP:  Cassandria Anger, MD  Cardiologist: Dr. Burt Knack   Chief Complaint: Reestablish cardiac care  History of Present Illness:   Harold Pittman is a 77 y.o. male with history of CAD status post CABG 1997, hypertension, hyperlipidemia and chronic dyspnea referred by Dr. Alain Marion for reestablishing cardiac care.  He was doing well on cardiac standpoint when last seen by Dr. Burt Knack 07/2014.  Seen by PCP for routine follow-up and noted bilateral lower extremity edema.  Reduce amlodipine to 5 mg daily.  Stop furosemide and started on torsemide 100 mg daily.  Here today for reestablish cardiac care.  He has dyspnea on exertion without chest tightness or pressure.  He has noted intermittent lower extremity edema.  His edema has improved since starting torsemide.  Patient states that he has "some kind of stomach surgery 10/18 for abnormal 30 which seems like capsule endoscopy".  No formal cardiac clearance has been received yet.  He takes aspirin 81 mg daily, Coreg 25 mg twice daily, hydralazine 50 mg 3 times daily and ibesartan 150 mg daily.   Past Medical History:  Diagnosis Date  . Anxiety state, unspecified   . Blood in stool   . Chronic airway obstruction, not elsewhere classified   . Coronary atherosclerosis of artery bypass graft   . ED (erectile dysfunction)   . Esophageal reflux   . Gout, unspecified   . Osteoarthrosis, unspecified whether generalized or localized, unspecified site   . Other and unspecified hyperlipidemia   . Other specified disorder of rectum and anus   . Personal history of tobacco use, presenting hazards to health   . Type II or unspecified type diabetes mellitus without mention of complication, not stated as uncontrolled   . Unspecified essential hypertension     Past Surgical History:  Procedure Laterality Date  . BIOPSY  09/26/2017   Procedure:  BIOPSY;  Surgeon: Ladene Artist, MD;  Location: [facility];  Service: Endoscopy;;  . COLONOSCOPY WITH PROPOFOL N/A 09/26/2017   Procedure: COLONOSCOPY WITH PROPOFOL;  Surgeon: Ladene Artist, MD;  Location: [facility] ENDOSCOPY;  Service: Endoscopy;  Laterality: N/A;  . CORONARY ARTERY BYPASS GRAFT  97   LIMA to LAD, sequential saphenous vein graft to the first and second diagonal, sequential saphenous vein graft to the intermediate OM and circumflex and SVG to RCA  . ESOPHAGOGASTRODUODENOSCOPY (EGD) WITH PROPOFOL N/A 09/26/2017   Procedure: ESOPHAGOGASTRODUODENOSCOPY (EGD) WITH PROPOFOL;  Surgeon: Ladene Artist, MD;  Location: [facility] ENDOSCOPY;  Service: Endoscopy;  Laterality: N/A;  . KNEE SURGERY     BILATERAL  . POLYPECTOMY  09/26/2017   Procedure: POLYPECTOMY;  Surgeon: Ladene Artist, MD;  Location: [facility] ENDOSCOPY;  Service: Endoscopy;;  . ROTATOR CUFF REPAIR      Current Medications:  Prior to Admission medications   Medication Sig Start Date End Date Taking? Authorizing Provider  albuterol (PROAIR HFA) 108 (90 BASE) MCG/ACT inhaler Inhale 2 puffs into the lungs 2 (two) times daily. 09/26/10  Yes Noralee Space, MD  amLODipine (NORVASC) 5 MG tablet Take 1 tablet (5 mg total) by mouth daily. 01/21/18  Yes Kavita Bartl, PA  aspirin 81 MG EC tablet Take 81 mg by mouth daily.     Yes [provider]  beclomethasone (QVAR) 80 MCG/ACT inhaler Inhale 2 puffs into the lungs 2 (two) times daily. 09/26/10  Yes Lenna Gilford,  Deborra Medina, MD  carvedilol (COREG) 25 MG tablet TAKE 1 TABLET BY MOUTH TWO  TIMES DAILY WITH MEALS Patient taking differently: TAKE 1 TABLET(25mg ) BY MOUTH TWO  TIMES DAILY WITH MEALS 07/01/17  Yes Plotnikov, Evie Lacks, MD  Cholecalciferol 1000 UNITS tablet Take 2,000 Units by mouth daily.     Yes [provider]  colchicine 0.6 MG tablet Take 1 tablet (0.6 mg total) by mouth 2 (two) times daily as needed. Patient taking differently: Take 0.6 mg by mouth 2 (two)  times daily as needed (For gout).  09/23/17  Yes Plotnikov, Evie Lacks, MD  Cyanocobalamin (VITAMIN B-12) 1000 MCG SUBL Place 1 tablet (1,000 mcg total) under the tongue daily. 08/17/15  Yes Plotnikov, Evie Lacks, MD  docusate sodium (COLACE) 100 MG capsule Take 1 capsule (100 mg total) by mouth 2 (two) times daily. 07/26/15  Yes Donne Hazel, MD  ferrous sulfate 325 (65 FE) MG tablet Take 1 tablet (325 mg total) by mouth daily. 09/26/17 09/26/18 Yes Danford, Suann Larry, MD  guaiFENesin (MUCINEX) 600 MG 12 hr tablet Take 1 tablet (600 mg total) by mouth 2 (two) times daily as needed for cough or to loosen phlegm. 06/12/16  Yes Nche, Charlene Brooke, NP  hydrALAZINE (APRESOLINE) 50 MG tablet TAKE 1 TABLET BY MOUTH 3  TIMES DAILY Patient taking differently: TAKE 1 TABLET(50mg ) BY MOUTH 3  TIMES DAILY 07/01/17  Yes Plotnikov, Evie Lacks, MD  irbesartan (AVAPRO) 150 MG tablet Take 1 tablet (150 mg total) by mouth daily. 11/18/17  Yes Plotnikov, Evie Lacks, MD  loperamide (IMODIUM) 2 MG capsule Take 1 capsule by mouth 4  times daily as needed for  diarrhea or loose stools 01/17/15  Yes Plotnikov, Evie Lacks, MD  metFORMIN (GLUCOPHAGE XR) 750 MG 24 hr tablet Take 1 tablet (750 mg total) by mouth daily with breakfast. 11/18/17  Yes Plotnikov, Evie Lacks, MD  omeprazole (PRILOSEC) 40 MG capsule Take 1 capsule (40 mg total) by mouth daily. 09/26/17  Yes Danford, Suann Larry, MD  pravastatin (PRAVACHOL) 80 MG tablet TAKE 1 TABLET BY MOUTH AT  BEDTIME 07/01/17  Yes Plotnikov, Evie Lacks, MD  ranitidine (ZANTAC) 300 MG tablet TAKE 1 TABLET BY MOUTH  EVERY EVENING 07/01/17  Yes Plotnikov, Evie Lacks, MD  tadalafil (CIALIS) 20 MG tablet Take as directed Patient taking differently: Take 20 mg by mouth daily as needed for erectile dysfunction.  09/26/10  Yes Noralee Space, MD  torsemide (DEMADEX) 100 MG tablet Take 1 tablet (100 mg total) by mouth daily. 01/08/18  Yes Plotnikov, Evie Lacks, MD  zolpidem (AMBIEN) 5 MG tablet Take 1  tablet (5 mg total) by mouth at bedtime as needed for sleep. Patient taking differently: Take 5 mg by mouth at bedtime.  03/25/13  Yes Biagio Borg, MD    Allergies:   Benazepril   Social History   Socioeconomic History  . Marital status: Widowed    Spouse name: Not on file  . Number of children: Not on file  . Years of education: Not on file  . Highest education level: Not on file  Occupational History  . Occupation: RETIRED  Social Needs  . Financial resource strain: Not on file  . Food insecurity:    Worry: Not on file    Inability: Not on file  . Transportation needs:    Medical: Not on file    Non-medical: Not on file  Tobacco Use  . Smoking status: Former Research scientist (life sciences)  . Smokeless  tobacco: Never Used  . Tobacco comment: Stopped 1997  Substance and Sexual Activity  . Alcohol use: No    Comment: Stopped 1997  . Drug use: No  . Sexual activity: Not Currently  Lifestyle  . Physical activity:    Days per week: Not on file    Minutes per session: Not on file  . Stress: Not on file  Relationships  . Social connections:    Talks on phone: Not on file    Gets together: Not on file    Attends religious service: Not on file    Active member of club or organization: Not on file    Attends meetings of clubs or organizations: Not on file    Relationship status: Not on file  Other Topics Concern  . Not on file  Social History Narrative   Patient does not get regular exercise   Daily Caffeine use - 6      Wife decease July 21, 2005;         Family History:  The patient's family history includes Coronary artery disease in his other; Depression in his sister; Heart disease in his sister; Heart disease (age of onset: 32) in his mother; Hypertension in his other; Mental illness in his father. ROS:   Please see the history of present illness.    ROS All other systems reviewed and are negative.   PHYSICAL EXAM:   VS:  BP 132/60   Pulse 70   Ht 5\' 11"  (1.803 m)   Wt 191 lb (86.6  kg)   SpO2 96%   BMI 26.64 kg/m    GEN: Well nourished, well developed, in no acute distress  HEENT: normal  Neck: no JVD, carotid bruits, or masses Cardiac:RRR; no murmurs, rubs, or gallops, Trace BL edema  Respiratory:  clear to auscultation bilaterally, normal work of breathing GI: soft, nontender, nondistended, + BS MS: no deformity or atrophy  Skin: warm and dry, no rash Neuro:  Alert and Oriented x 3, Strength and sensation are intact Psych: euthymic mood, full affect  Wt Readings from Last 3 Encounters:  01/21/18 191 lb (86.6 kg)  01/08/18 199 lb (90.3 kg)  12/25/17 194 lb (88 kg)      Studies/Labs Reviewed:   EKG:  EKG is not ordered today.  The ekg ordered 09/24/17  demonstrates normal sinus rhythm with nonspecific T wave inversion  Recent Labs: 09/24/2017: B Natriuretic Peptide 183.9 11/18/2017: Hemoglobin 11.6; Platelets 164.0 01/08/2018: BUN 10; Creatinine, Ser 1.30; Potassium 3.4; Pro B Natriuretic peptide (BNP) 157.0; Sodium 141; TSH 0.74   Lipid Panel    Component Value Date/Time   CHOL 102 07/25/2017 0911   TRIG 78.0 07/25/2017 0911   TRIG 109 01/28/2006 0841   HDL 33.20 (L) 07/25/2017 0911   CHOLHDL 3 07/25/2017 0911   VLDL 15.6 07/25/2017 0911   LDLCALC 53 07/25/2017 0911    Additional studies/ records that were reviewed today include:   Echocardiogram: 07/2015 Study Conclusions  - Left ventricle: The cavity size was normal. There was mild   concentric hypertrophy. Systolic function was normal. The   estimated ejection fraction was in the range of 60% to 65%.   Probable akinesis of the basalinferior myocardium. Left   ventricular diastolic function parameters were normal. - Aortic valve: Trileaflet; mildly thickened, mildly calcified   leaflets. - Mitral valve: There was mild regurgitation. - Pulmonary arteries: PA peak pressure: 45 mm Hg (S).  Impressions:  - The right ventricular systolic pressure was increased consistent  with moderate  pulmonary hypertension.    ASSESSMENT & PLAN:    1.  CAD status post CABG -Recent worsening of lower extremity edema with stable chronic dyspnea on exertion.  He denies any exertional chest pain/pressure.  He does not remember symptoms prior to his CABG.  Last echocardiogram in 2017 showed normal LV function with moderate pulmonary hypertension. -Given his symptoms and upcoming abdominal surgery will get echocardiogram and stress test. -Continue current medication  2.  Preoperative cardiovascular clearance for upcoming abdominal surgery -No clearance has been received yet.  Will clear based on study.  3. HLD - 07/25/2017: Cholesterol 102; HDL 33.20; LDL Cholesterol 53; Triglycerides 78.0; VLDL 15.6  - Continue statin. Follow by PCP.   4. HTN - BP stable on current medications.   5. COPD - not current smoker. No active wheezing.   Medication Adjustments/Labs and Tests Ordered: Current medicines are reviewed at length with the patient today.  Concerns regarding medicines are outlined above.  Medication changes, Labs and Tests ordered today are listed in the Patient Instructions below. Patient Instructions  Your physician recommends that you continue on your current medications as directed. Please refer to the Current Medication list given to you today.  Your physician has requested that you have an echocardiogram. Echocardiography is a painless test that uses sound waves to create images of your heart. It provides your doctor with information about the size and shape of your heart and how well your heart's chambers and valves are working. This procedure takes approximately one hour. There are no restrictions for this procedure.   Your physician has requested that you have a lexiscan myoview. For further information please visit [email or web address]. Please follow instruction sheet, as given.  Your physician recommends that you return for lab work in:  [place] physician  recommends that you schedule a follow-up appointment iN 3 MONTHS WITH DR Larry Sierras [place], [place]  01/21/2018 12:57 PM    [address], [place], [place]  [id number] Phone: [phone]; Fax: [phone]

## 2018-01-21 NOTE — Patient Instructions (Addendum)
Your physician recommends that you continue on your current medications as directed. Please refer to the Current Medication list given to you today.  Your physician has requested that you have an echocardiogram. Echocardiography is a painless test that uses sound waves to create images of your heart. It provides your doctor with information about the size and shape of your heart and how well your heart's chambers and valves are working. This procedure takes approximately one hour. There are no restrictions for this procedure.   Your physician has requested that you have a lexiscan myoview. For further information please visit [email or web address]. Please follow instruction sheet, as given.  Your physician recommends that you return for lab work in:  [place] physician recommends that you schedule a follow-up appointment iN [place]

## 2018-01-27 NOTE — Telephone Encounter (Signed)
Attempted to contact Bary Breckin without success, he has no working numbers. S.Essie Gehret EMTP

## 2018-01-30 ENCOUNTER — Other Ambulatory Visit: Payer: Self-pay

## 2018-01-30 ENCOUNTER — Ambulatory Visit (HOSPITAL_BASED_OUTPATIENT_CLINIC_OR_DEPARTMENT_OTHER): Payer: Medicare Other

## 2018-01-30 ENCOUNTER — Ambulatory Visit (HOSPITAL_COMMUNITY): Payer: Medicare Other | Attending: Internal Medicine

## 2018-01-30 DIAGNOSIS — Z01818 Encounter for other preprocedural examination: Secondary | ICD-10-CM | POA: Diagnosis not present

## 2018-01-30 DIAGNOSIS — R0600 Dyspnea, unspecified: Secondary | ICD-10-CM

## 2018-01-30 LAB — MYOCARDIAL PERFUSION IMAGING
CHL CUP NUCLEAR SDS: 2
CHL CUP NUCLEAR SRS: 0
CHL CUP NUCLEAR SSS: 2
LV dias vol: 112 mL (ref 62–150)
LV sys vol: 42 mL
Peak HR: 79 {beats}/min
Rest HR: 59 {beats}/min
TID: 1.11

## 2018-01-30 LAB — ECHOCARDIOGRAM COMPLETE
HEIGHTINCHES: 71 in
Weight: 3056 oz

## 2018-01-30 MED ORDER — TECHNETIUM TC 99M TETROFOSMIN IV KIT
31.9000 | PACK | Freq: Once | INTRAVENOUS | Status: AC | PRN
  Administered 2018-01-30: 31.9 via INTRAVENOUS
  Filled 2018-01-30: qty 32

## 2018-01-30 MED ORDER — REGADENOSON 0.4 MG/5ML IV SOLN
0.4000 mg | Freq: Once | INTRAVENOUS | Status: AC
  Administered 2018-01-30: 0.4 mg via INTRAVENOUS

## 2018-01-30 MED ORDER — TECHNETIUM TC 99M TETROFOSMIN IV KIT
10.7000 | PACK | Freq: Once | INTRAVENOUS | Status: AC | PRN
  Administered 2018-01-30: 10.7 via INTRAVENOUS
  Filled 2018-01-30: qty 11

## 2018-01-31 ENCOUNTER — Other Ambulatory Visit: Payer: Self-pay

## 2018-01-31 ENCOUNTER — Encounter (HOSPITAL_COMMUNITY): Payer: Self-pay | Admitting: *Deleted

## 2018-01-31 ENCOUNTER — Encounter: Payer: Self-pay | Admitting: *Deleted

## 2018-01-31 NOTE — Progress Notes (Signed)
Spoke with pt for pre-op call. Pt has hx of CAD with a CABG in 1997. Pt's cardiologist is Dr. Burt Knack, last office visit with the PA was 01/21/18. Pt denies any recent chest pain. States he's sob due to COPD. Pt is a type 2 diabetic. Last A1C was 5.8 on 11/18/17. Pt states when he checks his fasting blood sugar it usually ranges between 160-165. Pt instructed not to take his Metformin the morning of surgery. Instructed pt to check his blood sugar when he gets up Monday AM.  If blood sugar is 70 or below, treat with 1/2 cup of clear juice (apple) and recheck blood sugar 15 minutes after drinking juice. Pt voiced understanding  EKG - 09/25/17 in Epic CXR - 6/181/9 in Epic Echo - 01/30/18 in Epic (Cardiac clearance noted on this result) Stress test - 01/30/18 in Epic  Pt states he was not instructed to stop his Aspirin prior to the procedure.

## 2018-02-03 ENCOUNTER — Ambulatory Visit (HOSPITAL_COMMUNITY): Payer: Medicare Other | Admitting: Certified Registered Nurse Anesthetist

## 2018-02-03 ENCOUNTER — Encounter (HOSPITAL_COMMUNITY)
Admission: RE | Disposition: A | Discharge status: Home / Self Care | Payer: Self-pay | Source: Ambulatory Visit | Attending: Gastroenterology

## 2018-02-03 ENCOUNTER — Other Ambulatory Visit: Payer: Self-pay

## 2018-02-03 ENCOUNTER — Ambulatory Visit (HOSPITAL_COMMUNITY)
Admission: RE | Admit: 2018-02-03 | Discharge: 2018-02-03 | Disposition: A | Discharge status: Home / Self Care | Payer: Medicare Other | Source: Ambulatory Visit | Attending: Gastroenterology | Admitting: Gastroenterology

## 2018-02-03 ENCOUNTER — Encounter (HOSPITAL_COMMUNITY): Payer: Self-pay | Admitting: *Deleted

## 2018-02-03 DIAGNOSIS — G4730 Sleep apnea, unspecified: Secondary | ICD-10-CM | POA: Insufficient documentation

## 2018-02-03 DIAGNOSIS — K3189 Other diseases of stomach and duodenum: Secondary | ICD-10-CM | POA: Diagnosis not present

## 2018-02-03 DIAGNOSIS — D131 Benign neoplasm of stomach: Secondary | ICD-10-CM

## 2018-02-03 DIAGNOSIS — Z79899 Other long term (current) drug therapy: Secondary | ICD-10-CM | POA: Insufficient documentation

## 2018-02-03 DIAGNOSIS — K317 Polyp of stomach and duodenum: Secondary | ICD-10-CM

## 2018-02-03 DIAGNOSIS — M1990 Unspecified osteoarthritis, unspecified site: Secondary | ICD-10-CM | POA: Diagnosis not present

## 2018-02-03 DIAGNOSIS — K2970 Gastritis, unspecified, without bleeding: Secondary | ICD-10-CM | POA: Diagnosis not present

## 2018-02-03 DIAGNOSIS — Z951 Presence of aortocoronary bypass graft: Secondary | ICD-10-CM | POA: Insufficient documentation

## 2018-02-03 DIAGNOSIS — Z7984 Long term (current) use of oral hypoglycemic drugs: Secondary | ICD-10-CM | POA: Insufficient documentation

## 2018-02-03 DIAGNOSIS — R198 Other specified symptoms and signs involving the digestive system and abdomen: Secondary | ICD-10-CM

## 2018-02-03 DIAGNOSIS — Z7982 Long term (current) use of aspirin: Secondary | ICD-10-CM | POA: Insufficient documentation

## 2018-02-03 DIAGNOSIS — I10 Essential (primary) hypertension: Secondary | ICD-10-CM | POA: Insufficient documentation

## 2018-02-03 DIAGNOSIS — E785 Hyperlipidemia, unspecified: Secondary | ICD-10-CM | POA: Diagnosis not present

## 2018-02-03 DIAGNOSIS — I2510 Atherosclerotic heart disease of native coronary artery without angina pectoris: Secondary | ICD-10-CM | POA: Insufficient documentation

## 2018-02-03 DIAGNOSIS — D132 Benign neoplasm of duodenum: Secondary | ICD-10-CM | POA: Diagnosis not present

## 2018-02-03 DIAGNOSIS — K219 Gastro-esophageal reflux disease without esophagitis: Secondary | ICD-10-CM | POA: Diagnosis not present

## 2018-02-03 DIAGNOSIS — K2950 Unspecified chronic gastritis without bleeding: Secondary | ICD-10-CM | POA: Insufficient documentation

## 2018-02-03 DIAGNOSIS — Z87891 Personal history of nicotine dependence: Secondary | ICD-10-CM | POA: Diagnosis not present

## 2018-02-03 DIAGNOSIS — E119 Type 2 diabetes mellitus without complications: Secondary | ICD-10-CM | POA: Diagnosis not present

## 2018-02-03 DIAGNOSIS — J449 Chronic obstructive pulmonary disease, unspecified: Secondary | ICD-10-CM | POA: Diagnosis not present

## 2018-02-03 DIAGNOSIS — I5032 Chronic diastolic (congestive) heart failure: Secondary | ICD-10-CM | POA: Diagnosis not present

## 2018-02-03 DIAGNOSIS — I110 Hypertensive heart disease with heart failure: Secondary | ICD-10-CM | POA: Diagnosis not present

## 2018-02-03 HISTORY — DX: Dyspnea, unspecified: R06.00

## 2018-02-03 HISTORY — PX: ESOPHAGOGASTRODUODENOSCOPY (EGD) WITH PROPOFOL: SHX5813

## 2018-02-03 HISTORY — DX: Atherosclerotic heart disease of native coronary artery without angina pectoris: I25.10

## 2018-02-03 HISTORY — PX: SUBMUCOSAL INJECTION: SHX5543

## 2018-02-03 HISTORY — DX: Sleep apnea, unspecified: G47.30

## 2018-02-03 HISTORY — PX: BIOPSY: SHX5522

## 2018-02-03 HISTORY — DX: Anemia, unspecified: D64.9

## 2018-02-03 LAB — GLUCOSE, CAPILLARY: Glucose-Capillary: 156 mg/dL — ABNORMAL HIGH (ref 70–99)

## 2018-02-03 SURGERY — ESOPHAGOGASTRODUODENOSCOPY (EGD) WITH PROPOFOL
Anesthesia: Monitor Anesthesia Care

## 2018-02-03 MED ORDER — EPINEPHRINE PF 1 MG/10ML IJ SOSY
PREFILLED_SYRINGE | INTRAMUSCULAR | Status: AC
  Filled 2018-02-03: qty 10

## 2018-02-03 MED ORDER — LACTATED RINGERS IV SOLN
INTRAVENOUS | Status: DC
  Administered 2018-02-03: 08:00:00 via INTRAVENOUS

## 2018-02-03 MED ORDER — LIDOCAINE 2% (20 MG/ML) 5 ML SYRINGE
INTRAMUSCULAR | Status: DC | PRN
  Administered 2018-02-03: 100 mg via INTRAVENOUS

## 2018-02-03 MED ORDER — PROPOFOL 500 MG/50ML IV EMUL
INTRAVENOUS | Status: DC | PRN
  Administered 2018-02-03: 150 ug/kg/min via INTRAVENOUS

## 2018-02-03 MED ORDER — SODIUM CHLORIDE 0.9 % IV SOLN: INTRAVENOUS | Status: DC

## 2018-02-03 MED ORDER — ONDANSETRON HCL 4 MG/2ML IJ SOLN
INTRAMUSCULAR | Status: DC | PRN
  Administered 2018-02-03: 4 mg via INTRAVENOUS

## 2018-02-03 MED ORDER — SPOT INK MARKER SYRINGE KIT
PACK | SUBMUCOSAL | Status: AC
  Filled 2018-02-03: qty 5

## 2018-02-03 MED ORDER — GLYCOPYRROLATE 0.2 MG/ML IJ SOLN
INTRAMUSCULAR | Status: DC | PRN
  Administered 2018-02-03: 0.2 mg via INTRAVENOUS

## 2018-02-03 MED ORDER — PROPOFOL 10 MG/ML IV BOLUS
INTRAVENOUS | Status: DC | PRN
  Administered 2018-02-03: 100 mg via INTRAVENOUS

## 2018-02-03 MED ORDER — SODIUM CHLORIDE 0.9 % IV SOLN
INTRAVENOUS | Status: DC | PRN
  Administered 2018-02-03: 50 ug/min via INTRAVENOUS

## 2018-02-03 MED ORDER — METHYLENE BLUE 0.5 % INJ SOLN
INTRAVENOUS | Status: DC | PRN
  Administered 2018-02-03: 10 mL

## 2018-02-03 SURGICAL SUPPLY — 14 items

## 2018-02-03 NOTE — Op Note (Signed)
[facility] Patient Name: Harold Pittman Procedure Date : 02/03/2018 MRN: [id number] Attending MD: Justice Britain , MD Date of Birth: 1940/10/08 CSN: [id number] Age: 77 Admit Type: Outpatient Procedure:                Upper GI endoscopy Indications:              Therapeutic procedure, Gastric polyps, For therapy                            of gastric polyps Providers:                Justice Britain, MD, Carlyn Reichert, RN, Cletis Athens, Technician Referring MD:             Pricilla Riffle. Fuller Plan, MD Medicines:                Monitored Anesthesia Care Complications:            No immediate complications. Estimated Blood Loss:     Estimated blood loss was minimal. Procedure:                Pre-Anesthesia Assessment:                           - Prior to the procedure, a History and Physical                            was performed, and patient medications and                            allergies were reviewed. The patient's tolerance of                            previous anesthesia was also reviewed. The risks                            and benefits of the procedure and the sedation                            options and risks were discussed with the patient.                            All questions were answered, and informed consent                            was obtained. Prior Anticoagulants: The patient has                            taken aspirin, last dose was day of procedure. ASA                            Grade Assessment: III - A patient with severe  systemic disease. After reviewing the risks and                            benefits, the patient was deemed in satisfactory                            condition to undergo the procedure.                           After obtaining informed consent, the endoscope was                            passed under direct vision. Throughout the   procedure, the patient's blood pressure, pulse, and                            oxygen saturations were monitored continuously. The                            [id number] ([id number]) Olympus EGD Therapeutic was                            introduced through the mouth, and advanced to the                            third part of duodenum. The upper GI endoscopy was                            accomplished without difficulty. The patient                            tolerated the procedure. Scope In: Scope Out: Findings:      No gross lesions were noted in the entire esophagus.      A single at least 35-40 mm sessile polypoid lesion (in the region of       previously identified tubular adenoma) with no bleeding and no stigmata       of recent bleeding was found on the anterior wall of the gastric antrum       and extended towards the prepyloric region. We did not have indigo       carmine available. To try and better visualize the region, NBI was used       and then the region was successfully injected with methylene blue for       assessement of the lesion. The lesion was felt to be too large for just       an EMR resection of the site. Biopsies were taken with a cold forceps       for histology and placed in the antral jar.      Diffuse moderate mucosal changes characterized by discoloration,       granularity, smoothness and altered texture were found on the lesser       curvature of the stomach and on the lesser curvature of the gastric       antrum (biopsies from this region were included in the jars noted below).      No gross lesions were noted in the  entire examined stomach otherwise.       Because of history of intestinal metaplasia, decision was made to       proceed with mapping biopsies. Biopsies were taken with a cold forceps       for histology from the fundus. Biopsies were taken with a cold forceps       for histology from the cardia. Biopsies were taken with a cold forceps        for histology from the greater curve. Biopsies were taken with a cold       forceps for histology from the lesser curve. Biopsies were taken with a       cold forceps for histology from the incisura.      The duodenal bulb was normal.      A single 7 mm sessile polyp with no bleeding was found in the second       portion of the duodenum. Preparations were made for mucosal resection.       Saline was injected to raise the lesion. Snare mucosal resection was       performed. Resection and retrieval were complete. To close the defect       after mucosal resection, one hemostatic clip was successfully placed (MR       conditional). There was no bleeding at the end of the procedure.      No other gross lesions were noted in the entire visualized duodenum       (Bulb/D1/D2). Impression:               - No gross lesions in esophagus.                           - A single gastric polyp. Methylene blue was                            sprayed to better visualize edges. Biopsied lightly                            (though previously confirmed adenoma).                           - Discolored, granular, smooth and texture changed                            mucosa in the lesser curvature and lesser curvature                            of the gastric antrum. Biopsied as part of gastric                            mapping                           - No other gross lesions in the stomach. Gastric                            mapping biopsies taken from fundus/cardia/greater  curve/lesser curve/incisura/(antrum at region of                            polyp as above)                           - Normal duodenal bulb.                           - A single duodenal polyp. Resected and retrieved.                            Clip (MR conditional) was placed.                           - No other gross lesions in the entire visualized                            duodenum. Recommendation:            - The patient will be observed post-procedure,                            until all discharge criteria are met.                           - Discharge patient to home.                           - Patient has a contact number available for                            emergencies. The signs and symptoms of potential                            delayed complications were discussed with the                            patient. Return to normal activities tomorrow.                            Written discharge instructions were provided to the                            patient.                           - Resume previous diet.                           - Continue present medications.                           - Await pathology results.                           - Based on final results, I think this patient  should have a discussion for consideration of ESD                            for the size and length of this lesion. With the                            significant mucosal changes in the lesser curve of                            the antrum to the incisura, I suspect that this may                            be even larger. No indigo carmine was available to                            better delineate the lesion. If ESD is not possible                            then would consider repeat attempt at EMR vs                            surgical discussion.                           - The findings and recommendations were discussed                            with the patient.                           - The findings and recommendations were discussed                            with the patient's family. Procedure Code(s):        --- Professional ---                           [phone], 51, Esophagogastroduodenoscopy, flexible,                            transoral; with endoscopic mucosal resection Diagnosis Code(s):        --- Professional ---                            K31.7, Polyp of stomach and duodenum                           K31.89, Other diseases of stomach and duodenum CPT copyright 2018 [employer] Association. All rights reserved. The codes documented in this report are preliminary and upon coder review may  be revised to meet current compliance requirements. Justice Britain, MD 02/03/2018 10:58:23 AM Number of Addenda: 0

## 2018-02-03 NOTE — Anesthesia Postprocedure Evaluation (Signed)
Anesthesia Post Note  Patient: Harold Pittman  Procedure(s) Performed: ESOPHAGOGASTRODUODENOSCOPY (EGD) (N/A ) ENDOSCOPIC MUCOSAL RESECTION (N/A ) SUBMUCOSAL INJECTION HEMOSTASIS CONTROL BIOPSY     Patient location during evaluation: Endoscopy Anesthesia Type: MAC Level of consciousness: sedated Pain management: pain level controlled Vital Signs Assessment: post-procedure vital signs reviewed and stable Respiratory status: spontaneous breathing Cardiovascular status: stable Postop Assessment: no apparent nausea or vomiting Anesthetic complications: no    Last Vitals:  Vitals:   02/03/18 1100 02/03/18 1110  BP: (!) 104/56 (!) 117/59  Pulse: 65 64  Resp: (!) 22 (!) 21  Temp:    SpO2: 93% 93%    Last Pain:  Vitals:   02/03/18 1110  TempSrc:   PainSc: 0-No pain   Pain Goal:                 Huston Foley

## 2018-02-03 NOTE — Anesthesia Preprocedure Evaluation (Signed)
Anesthesia Evaluation  Patient identified by MRN, date of birth, ID band Patient awake    Reviewed: Allergy & Precautions, NPO status , Patient's Chart, lab work & pertinent test results  Airway Mallampati: II  TM Distance: >3 FB Neck ROM: Full    Dental no notable dental hx.    Pulmonary COPD, former smoker,    Pulmonary exam normal breath sounds clear to auscultation       Cardiovascular hypertension, Pt. on medications and Pt. on home beta blockers + CABG (1997)  Normal cardiovascular exam Rhythm:Regular Rate:Normal     Neuro/Psych negative neurological ROS     GI/Hepatic Neg liver ROS, GERD  Controlled and Medicated,  Endo/Other  negative endocrine ROSdiabetes, Type 2, Oral Hypoglycemic Agents  Renal/GU   negative genitourinary   Musculoskeletal   Abdominal Normal abdominal exam  (+)   Peds negative pediatric ROS (+)  Hematology   Anesthesia Other Findings Harold Pittman  ECHO COMPLETE WO IMAGING ENHANCING AGENT  Order# [id number]  Reading physician: Elouise Munroe, MD Ordering physician: Leanor Kail, PA Study date: 01/30/18 Result Notes for ECHOCARDIOGRAM COMPLETE   Notes recorded by Jeanann Lewandowsky, RMA on 01/31/2018 at 8:30 AM EDT Spoke with [facility], Dr. Donneta Romberg nurse, and she stated that they didn't need a cardiac clearance. Tried to reach pt on both #'s and one has been d/c'd and the other was changed to an unknown #. Will mail a letter.  ------  Notes recorded by Leanor Kail, PA on 01/30/2018 at 4:03 PM EDT Echo showed normal pumping function with mild stiffness of heart muscle. No significant valvular abnormality. Stress test low risk without ischemia or infraction. Encouraged daily exercise. He will be cleared for upcoming abdominal surgery at acceptable risk (Clearance hasn't received yet - will required phone call).   Study Result   Result status: Final  result                          Harold Pitt[facility]                        [address], [place] [address]                            [phone]  ------------------------------------------------------------------- Transthoracic Echocardiography  Patient:    Harold Pittman, Harold Pittman MR #:       [id number] Study Date: 01/30/2018 Gender:     M Age:        77 Height:     180.3 cm Weight:     86.6 kg BSA:        2.1 m^2 Pt. Status: Room:   SONOGRAPHER  Diamond Nickel  PERFORMING   [facility]  ORDERING     Bhagat, Bhavinkumar  REFERRING    Bhagat, Bhavinkumar  ATTENDING    Cherlynn Kaiser  cc:  ------------------------------------------------------------------- LV EF: 60% -   65%  ------------------------------------------------------------------- Indications:      R06.00 Dyspnea. V37.482 Pre-op testing.  ------------------------------------------------------------------- History:   PMH:   Coronary artery disease.  Risk factors: Hypertension. Diabetes mellitus. Dyslipidemia.  ------------------------------------------------------------------- Study Conclusions  - Left ventricle: The cavity size was normal. Systolic function was   normal. The estimated ejection fraction was in the range of  60%   to 65%. Wall motion was normal; there were no regional wall   motion abnormalities. Doppler parameters are consistent with   abnormal left ventricular relaxation (grade 1 diastolic   dysfunction). - Aortic valve: Transvalvular velocity was within the normal range.   There was no stenosis. There was no regurgitation. Mean gradient   (S): 4 mm Hg. - Mitral valve: Mildly thickened leaflets . Transvalvular velocity   was within the normal range. There was no evidence for stenosis.   There was mild regurgitation. - Left atrium: The atrium was normal in size. - Right ventricle: The cavity size was normal. Wall thickness was   normal.  Systolic function was normal. RV systolic pressure (S,   est): 35 mm Hg. - Right atrium: The atrium was normal in size. - Tricuspid valve: There was moderate regurgitation. - Pulmonary arteries: Systolic pressure was mildly increased. PA   peak pressure: 35 mm Hg (S). - Inferior vena cava: The vessel was normal in size. The   respirophasic diameter changes were in the normal range (= 50%),   consistent with normal central venous pressure. - Pericardium, extracardiac: Prominent epicardial fat layer. There   was no pericardial effusion.  ------------------------------------------------------------------- Labs, prior tests, procedures, and surgery: Coronary artery     Reproductive/Obstetrics negative OB ROS                             Anesthesia Physical  Anesthesia Plan  ASA: III  Anesthesia Plan: MAC   Post-op Pain Management:    Induction: Intravenous  PONV Risk Score and Plan:   Airway Management Planned: Nasal Cannula and Mask  Additional Equipment:   Intra-op Plan:   Post-operative Plan:   Informed Consent: I have reviewed the patients History and Physical, chart, labs and discussed the procedure including the risks, benefits and alternatives for the proposed anesthesia with the patient or authorized representative who has indicated his/her understanding and acceptance.   Dental advisory given  Plan Discussed with: CRNA  Anesthesia Plan Comments:         Anesthesia Quick Evaluation

## 2018-02-03 NOTE — Transfer of Care (Signed)
Immediate Anesthesia Transfer of Care Note  Patient: Marsh Dolly Shearer  Procedure(s) Performed: ESOPHAGOGASTRODUODENOSCOPY (EGD) (N/A ) ENDOSCOPIC MUCOSAL RESECTION (N/A ) SUBMUCOSAL INJECTION HEMOSTASIS CONTROL BIOPSY  Patient Location: PACU and Endoscopy Unit  Anesthesia Type:MAC  Level of Consciousness: drowsy and patient cooperative  Airway & Oxygen Therapy: Patient Spontanous Breathing and Patient connected to nasal cannula oxygen  Post-op Assessment: Report given to RN and Post -op Vital signs reviewed and stable  Post vital signs: Reviewed and stable  Last Vitals:  Vitals Value Taken Time  BP    Temp    Pulse    Resp    SpO2      Last Pain:  Vitals:   02/03/18 0800  TempSrc: Oral  PainSc: 0-No pain         Complications: No apparent anesthesia complications

## 2018-02-03 NOTE — H&P (Signed)
GASTROENTEROLOGY OUTPATIENT PROCEDURE H&P NOTE   Primary Care Physician: Cassandria Anger, MD  HPI: Harold Pittman is a 78 y.o. male who presents for EGD/EMR.  Past Medical History:  Diagnosis Date  . Anemia    low iron  . Anxiety state, unspecified   . Blood in stool   . Chronic airway obstruction, not elsewhere classified   . Coronary artery disease   . Coronary atherosclerosis of artery bypass graft   . Dyspnea   . ED (erectile dysfunction)   . Esophageal reflux   . Gout, unspecified   . Osteoarthrosis, unspecified whether generalized or localized, unspecified site   . Other and unspecified hyperlipidemia   . Other specified disorder of rectum and anus   . Personal history of tobacco use, presenting hazards to health   . Sleep apnea    does not use cpap  . Type II or unspecified type diabetes mellitus without mention of complication, not stated as uncontrolled   . Unspecified essential hypertension    Past Surgical History:  Procedure Laterality Date  . BIOPSY  09/26/2017   Procedure: BIOPSY;  Surgeon: Ladene Artist, MD;  Location: [facility];  Service: Endoscopy;;  . COLONOSCOPY WITH PROPOFOL N/A 09/26/2017   Procedure: COLONOSCOPY WITH PROPOFOL;  Surgeon: Ladene Artist, MD;  Location: [facility] ENDOSCOPY;  Service: Endoscopy;  Laterality: N/A;  . CORONARY ARTERY BYPASS GRAFT  97   LIMA to LAD, sequential saphenous vein graft to the first and second diagonal, sequential saphenous vein graft to the intermediate OM and circumflex and SVG to RCA  . ESOPHAGOGASTRODUODENOSCOPY (EGD) WITH PROPOFOL N/A 09/26/2017   Procedure: ESOPHAGOGASTRODUODENOSCOPY (EGD) WITH PROPOFOL;  Surgeon: Ladene Artist, MD;  Location: [facility] ENDOSCOPY;  Service: Endoscopy;  Laterality: N/A;  . KNEE SURGERY     BILATERAL  . POLYPECTOMY  09/26/2017   Procedure: POLYPECTOMY;  Surgeon: Ladene Artist, MD;  Location: [facility] ENDOSCOPY;  Service: Endoscopy;;  . ROTATOR CUFF REPAIR     Current  Facility-Administered Medications  Medication Dose Route Frequency Provider Last Rate Last Dose  . lactated ringers infusion   Intravenous Continuous Mansouraty, Telford Nab., MD 20 mL/hr at 02/03/18 5701     Allergies  Allergen Reactions  . Benazepril Shortness Of Breath    Cough, wheezing   Family History  Problem Relation Age of Onset  . Depression Sister   . Heart disease Sister   . Heart disease Mother 57       CAD  . Mental illness Father        Alzheimer's  . Hypertension Other   . Coronary artery disease Other        Male 1st degree relative <50  . Colon cancer Neg Hx   . Stomach cancer Neg Hx   . Esophageal cancer Neg Hx   . Liver disease Neg Hx   . Pancreatic cancer Neg Hx   . Rectal cancer Neg Hx    Social History   Socioeconomic History  . Marital status: Widowed    Spouse name: Not on file  . Number of children: Not on file  . Years of education: Not on file  . Highest education level: Not on file  Occupational History  . Occupation: RETIRED  Social Needs  . Financial resource strain: Not on file  . Food insecurity:    Worry: Not on file    Inability: Not on file  . Transportation needs:    Medical: Not on file  Non-medical: Not on file  Tobacco Use  . Smoking status: Former Research scientist (life sciences)  . Smokeless tobacco: Never Used  . Tobacco comment: Stopped 1997  Substance and Sexual Activity  . Alcohol use: No    Comment: Stopped 1997  . Drug use: No  . Sexual activity: Not Currently  Lifestyle  . Physical activity:    Days per week: Not on file    Minutes per session: Not on file  . Stress: Not on file  Relationships  . Social connections:    Talks on phone: Not on file    Gets together: Not on file    Attends religious service: Not on file    Active member of club or organization: Not on file    Attends meetings of clubs or organizations: Not on file    Relationship status: Not on file  . Intimate partner violence:    Fear of current or ex partner:  Not on file    Emotionally abused: Not on file    Physically abused: Not on file    Forced sexual activity: Not on file  Other Topics Concern  . Not on file  Social History Narrative   Patient does not get regular exercise   Daily Caffeine use - 6      Wife decease Jul 06, 2005;        Physical Exam: Vital signs in last 24 hours: Temp:  [97.9 F (36.6 C)] 97.9 F (36.6 C) (10/28 0800) Pulse Rate:  [67] 67 (10/28 0800) Resp:  [22] 22 (10/28 0800) BP: (127)/(60) 127/60 (10/28 0800) SpO2:  [96 %] 96 % (10/28 0800) Weight:  [86.6 kg] 86.6 kg (10/28 0800)   GEN: NAD EYE: Sclerae anicteric ENT: MMM CV: RR without R/Gs  RESP: CTAB posteriorly GI: Soft, NT/ND NEURO:  Alert & Oriented x 3  Lab Results: No results for input(s): WBC, HGB, HCT, PLT in the last 72 hours. BMET No results for input(s): NA, K, CL, CO2, GLUCOSE, BUN, CREATININE, CALCIUM in the last 72 hours. LFT No results for input(s): PROT, ALBUMIN, AST, ALT, ALKPHOS, BILITOT, BILIDIR, IBILI in the last 72 hours. PT/INR No results for input(s): LABPROT, INR in the last 72 hours.   Impression / Plan: This is a 77 y.o.male who presents for EGD/EMR.  The risks and benefits of endoscopic evaluation were discussed with the patient; these include but are not limited to the risk of perforation, infection, bleeding, missed lesions, lack of diagnosis, severe illness requiring hospitalization, as well as anesthesia and sedation related illnesses.  The patient is agreeable to proceed.    Justice Britain, MD [facility] Advanced [facility]oscopy Office # [phone]

## 2018-02-03 NOTE — Addendum Note (Signed)
Addendum  created 02/03/18 1157 by Lowella Dell, CRNA   Intraprocedure Meds edited

## 2018-02-03 NOTE — Anesthesia Procedure Notes (Signed)
Procedure Name: MAC Date/Time: 02/03/2018 9:53 AM Performed by: Lowella Dell, CRNA Pre-anesthesia Checklist: Patient identified, Emergency Drugs available, Patient being monitored, Suction available and Timeout performed Patient Re-evaluated:Patient Re-evaluated prior to induction Oxygen Delivery Method: Simple face mask Induction Type: IV induction Placement Confirmation: positive ETCO2 Dental Injury: Teeth and Oropharynx as per pre-operative assessment

## 2018-02-03 NOTE — Discharge Instructions (Signed)
YOU HAD AN ENDOSCOPIC PROCEDURE TODAY: Refer to the procedure report and other information in the discharge instructions given to you for any specific questions about what was found during the examination. If this information does not answer your questions, please call [place] office at [phone] to clarify.  ° °YOU SHOULD EXPECT: Some feelings of bloating in the abdomen. Passage of more gas than usual. Walking can help get rid of the air that was put into your GI tract during the procedure and reduce the bloating. If you had a lower endoscopy (such as a colonoscopy or flexible sigmoidoscopy) you may notice spotting of blood in your stool or on the toilet paper. Some abdominal soreness may be present for a day or two, also. ° °DIET: Your first meal following the procedure should be a light meal and then it is ok to progress to your normal diet. A half-sandwich or bowl of soup is an example of a good first meal. Heavy or fried foods are harder to digest and may make you feel nauseous or bloated. Drink plenty of fluids but you should avoid alcoholic beverages for 24 hours. If you had a esophageal dilation, please see attached instructions for diet.   ° °ACTIVITY: Your care partner should take you home directly after the procedure. You should plan to take it easy, moving slowly for the rest of the day. You can resume normal activity the day after the procedure however YOU SHOULD NOT DRIVE, use power tools, machinery or perform tasks that involve climbing or major physical exertion for 24 hours (because of the sedation medicines used during the test).  ° °SYMPTOMS TO REPORT IMMEDIATELY: °A gastroenterologist can be reached at any hour. Please call [phone]  for any of the following symptoms:  °Following lower endoscopy (colonoscopy, flexible sigmoidoscopy) °Excessive amounts of blood in the stool  °Significant tenderness, worsening of abdominal pains  °Swelling of the abdomen that is new, acute  °Fever of 100° or  higher  °Following upper endoscopy (EGD, EUS, ERCP, esophageal dilation) °Vomiting of blood or coffee ground material  °New, significant abdominal pain  °New, significant chest pain or pain under the shoulder blades  °Painful or persistently difficult swallowing  °New shortness of breath  °Black, tarry-looking or red, bloody stools ° °FOLLOW UP:  °If any biopsies were taken you will be contacted by phone or by letter within the next 1-3 weeks. Call [phone]  if you have not heard about the biopsies in 3 weeks.  °Please also call with any specific questions about appointments or follow up tests. ° °

## 2018-02-04 ENCOUNTER — Encounter (HOSPITAL_COMMUNITY): Payer: Self-pay | Admitting: Gastroenterology

## 2018-02-05 ENCOUNTER — Telehealth: Payer: Self-pay | Admitting: Physician Assistant

## 2018-02-05 NOTE — Telephone Encounter (Signed)
Patient is calling for results

## 2018-02-06 ENCOUNTER — Encounter: Payer: Self-pay | Admitting: Physician Assistant

## 2018-02-06 NOTE — Telephone Encounter (Signed)
Follow up    Patient is returning call in reference to lab results.

## 2018-02-06 NOTE — Telephone Encounter (Signed)
Please let patient know that renal function and electrolytes stable. No change in therapy.

## 2018-02-06 NOTE — Telephone Encounter (Signed)
Returned pts call and he has been made aware of his lab results and to continue on current medications. Pt thanked me for the call.

## 2018-02-06 NOTE — Telephone Encounter (Signed)
Returned pts call re: lab results. Left a message for him to call back.

## 2018-02-12 ENCOUNTER — Telehealth: Payer: Self-pay | Admitting: Gastroenterology

## 2018-02-12 NOTE — Telephone Encounter (Signed)
All records faxed to [facility] at [facility] to number provided by Dr Rush Landmark.

## 2018-02-12 NOTE — Telephone Encounter (Signed)
Dr Rush Landmark have you reviewed the message that Dr Fuller Plan sent you regarding path results and referral?

## 2018-02-12 NOTE — Telephone Encounter (Signed)
I called and spoke with the patient as well as patient's daughter Kennith Center to update about the results of the biopsies as well as my discussion with Dr. Fuller Plan as well as my former colleagues at [facility].  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  They would be happy to evaluate the patient in clinic and subsequently plan for EST attempt if they feel that it is adequate.  I will proceed with the patient being scheduled with advanced endoscopy at [facility].  We will send the patient a letter with the formal results of his recent EGD.  All patient questions were answered, to the best of my ability, and the patient agrees to the aforementioned plan of action with follow-up as indicated.  They were appreciative for the callback.   Justice Britain, MD [facility]oscopy Office # [phone]

## 2018-02-12 NOTE — Telephone Encounter (Signed)
Patty, please send the records to [facility] at [facility]. [phone] - Phone [phone] - Fax  These records should include Dr. Fuller Plan and my outpatient notes most recently. Also please attach both the EGD results from Dr. Fuller Plan as well as the EGD results from my procedure. Please also send the pathology from both procedures. Please let me know if any issues. I have let the patient know and also updated Dr. Fuller Plan. Thanks as always. Chester Holstein

## 2018-02-12 NOTE — Telephone Encounter (Signed)
Call him back @ [phone]

## 2018-02-18 ENCOUNTER — Ambulatory Visit: Payer: Medicare Other | Admitting: Internal Medicine

## 2018-02-18 DIAGNOSIS — Z0289 Encounter for other administrative examinations: Secondary | ICD-10-CM

## 2018-02-20 ENCOUNTER — Ambulatory Visit: Payer: Medicare Other | Admitting: Cardiovascular Disease

## 2018-02-27 DIAGNOSIS — D131 Benign neoplasm of stomach: Secondary | ICD-10-CM | POA: Diagnosis not present

## 2018-03-26 DIAGNOSIS — I10 Essential (primary) hypertension: Secondary | ICD-10-CM | POA: Diagnosis not present

## 2018-03-26 DIAGNOSIS — K219 Gastro-esophageal reflux disease without esophagitis: Secondary | ICD-10-CM | POA: Diagnosis not present

## 2018-03-26 DIAGNOSIS — N183 Chronic kidney disease, stage 3 (moderate): Secondary | ICD-10-CM | POA: Diagnosis not present

## 2018-03-26 DIAGNOSIS — I25700 Atherosclerosis of coronary artery bypass graft(s), unspecified, with unstable angina pectoris: Secondary | ICD-10-CM | POA: Diagnosis not present

## 2018-03-26 DIAGNOSIS — D131 Benign neoplasm of stomach: Secondary | ICD-10-CM | POA: Diagnosis not present

## 2018-03-26 DIAGNOSIS — D508 Other iron deficiency anemias: Secondary | ICD-10-CM | POA: Diagnosis not present

## 2018-03-26 DIAGNOSIS — I5032 Chronic diastolic (congestive) heart failure: Secondary | ICD-10-CM | POA: Diagnosis not present

## 2018-03-26 DIAGNOSIS — E1151 Type 2 diabetes mellitus with diabetic peripheral angiopathy without gangrene: Secondary | ICD-10-CM | POA: Diagnosis not present

## 2018-03-26 DIAGNOSIS — J410 Simple chronic bronchitis: Secondary | ICD-10-CM | POA: Diagnosis not present

## 2018-03-27 DIAGNOSIS — K2950 Unspecified chronic gastritis without bleeding: Secondary | ICD-10-CM | POA: Diagnosis not present

## 2018-03-27 DIAGNOSIS — Z7982 Long term (current) use of aspirin: Secondary | ICD-10-CM | POA: Diagnosis not present

## 2018-03-27 DIAGNOSIS — Z7984 Long term (current) use of oral hypoglycemic drugs: Secondary | ICD-10-CM | POA: Diagnosis not present

## 2018-03-27 DIAGNOSIS — K317 Polyp of stomach and duodenum: Secondary | ICD-10-CM | POA: Diagnosis not present

## 2018-03-27 DIAGNOSIS — D131 Benign neoplasm of stomach: Secondary | ICD-10-CM | POA: Diagnosis not present

## 2018-03-27 DIAGNOSIS — D509 Iron deficiency anemia, unspecified: Secondary | ICD-10-CM | POA: Diagnosis not present

## 2018-03-27 DIAGNOSIS — I130 Hypertensive heart and chronic kidney disease with heart failure and stage 1 through stage 4 chronic kidney disease, or unspecified chronic kidney disease: Secondary | ICD-10-CM | POA: Diagnosis not present

## 2018-03-27 DIAGNOSIS — J449 Chronic obstructive pulmonary disease, unspecified: Secondary | ICD-10-CM | POA: Diagnosis not present

## 2018-03-27 DIAGNOSIS — E1122 Type 2 diabetes mellitus with diabetic chronic kidney disease: Secondary | ICD-10-CM | POA: Diagnosis not present

## 2018-03-27 DIAGNOSIS — K3189 Other diseases of stomach and duodenum: Secondary | ICD-10-CM | POA: Diagnosis not present

## 2018-03-27 DIAGNOSIS — Z87891 Personal history of nicotine dependence: Secondary | ICD-10-CM | POA: Diagnosis not present

## 2018-03-27 DIAGNOSIS — N183 Chronic kidney disease, stage 3 (moderate): Secondary | ICD-10-CM | POA: Diagnosis not present

## 2018-03-27 DIAGNOSIS — I5032 Chronic diastolic (congestive) heart failure: Secondary | ICD-10-CM | POA: Diagnosis not present

## 2018-03-27 DIAGNOSIS — Z79899 Other long term (current) drug therapy: Secondary | ICD-10-CM | POA: Diagnosis not present

## 2018-05-11 NOTE — Progress Notes (Deleted)
Cardiology Office Note:    Date:  05/11/2018   ID:  Harold Pittman, DOB [birth year], MRN [id number]  PCP:  Cassandria Anger, MD  Cardiologist:  Sherren Mocha, MD  Electrophysiologist:  None   Referring MD: Cassandria Anger, MD   No chief complaint on file.   History of Present Illness:    Harold Pittman is a 78 y.o. male with a hx of CAD, presenting for follow-up evaluation. He underwent multivessel CABG in 1997. Comorbid medical problems include HTN, mixed hyperlipidemia, and chronic dyspnea.   Past Medical History:  Diagnosis Date  . Anemia    low iron  . Anxiety state, unspecified   . Blood in stool   . Chronic airway obstruction, not elsewhere classified   . Coronary artery disease   . Coronary atherosclerosis of artery bypass graft   . Dyspnea   . ED (erectile dysfunction)   . Esophageal reflux   . Gout, unspecified   . Osteoarthrosis, unspecified whether generalized or localized, unspecified site   . Other and unspecified hyperlipidemia   . Other specified disorder of rectum and anus   . Personal history of tobacco use, presenting hazards to health   . Sleep apnea    does not use cpap  . Type II or unspecified type diabetes mellitus without mention of complication, not stated as uncontrolled   . Unspecified essential hypertension     Past Surgical History:  Procedure Laterality Date  . BIOPSY  09/26/2017   Procedure: BIOPSY;  Surgeon: Ladene Artist, MD;  Location: [facility] ENDOSCOPY;  Service: Endoscopy;;  . BIOPSY  02/03/2018   Procedure: BIOPSY;  Surgeon: Irving Copas., MD;  Location: [place];  Service: Gastroenterology;;  . COLONOSCOPY WITH PROPOFOL N/A 09/26/2017   Procedure: COLONOSCOPY WITH PROPOFOL;  Surgeon: Ladene Artist, MD;  Location: [facility] ENDOSCOPY;  Service: Endoscopy;  Laterality: N/A;  . CORONARY ARTERY BYPASS GRAFT  97   LIMA to LAD, sequential saphenous vein graft to the first and second diagonal, sequential saphenous vein  graft to the intermediate OM and circumflex and SVG to RCA  . ESOPHAGOGASTRODUODENOSCOPY (EGD) WITH PROPOFOL N/A 09/26/2017   Procedure: ESOPHAGOGASTRODUODENOSCOPY (EGD) WITH PROPOFOL;  Surgeon: Ladene Artist, MD;  Location: [facility] ENDOSCOPY;  Service: Endoscopy;  Laterality: N/A;  . ESOPHAGOGASTRODUODENOSCOPY (EGD) WITH PROPOFOL N/A 02/03/2018   Procedure: ESOPHAGOGASTRODUODENOSCOPY (EGD);  Surgeon: Irving Copas., MD;  Location: [place];  Service: Gastroenterology;  Laterality: N/A;  . KNEE SURGERY     BILATERAL  . POLYPECTOMY  09/26/2017   Procedure: POLYPECTOMY;  Surgeon: Ladene Artist, MD;  Location: [facility] ENDOSCOPY;  Service: Endoscopy;;  . ROTATOR CUFF REPAIR    . SUBMUCOSAL INJECTION  02/03/2018   Procedure: SUBMUCOSAL INJECTION;  Surgeon: Rush Landmark Telford Nab., MD;  Location: [facility] ENDOSCOPY;  Service: Gastroenterology;;    Current Medications: No outpatient medications have been marked as taking for the 05/12/18 encounter (Appointment) with Sherren Mocha, MD.     Allergies:   Benazepril   Social History   Socioeconomic History  . Marital status: Widowed    Spouse name: Not on file  . Number of children: Not on file  . Years of education: Not on file  . Highest education level: Not on file  Occupational History  . Occupation: RETIRED  Social Needs  . Financial resource strain: Not on file  . Food insecurity:    Worry: Not on file    Inability: Not on file  . Transportation needs:  Medical: Not on file    Non-medical: Not on file  Tobacco Use  . Smoking status: Former Research scientist (life sciences)  . Smokeless tobacco: Never Used  . Tobacco comment: Stopped 1997  Substance and Sexual Activity  . Alcohol use: No    Comment: Stopped 1997  . Drug use: No  . Sexual activity: Not Currently  Lifestyle  . Physical activity:    Days per week: Not on file    Minutes per session: Not on file  . Stress: Not on file  Relationships  . Social connections:    Talks on phone: Not  on file    Gets together: Not on file    Attends religious service: Not on file    Active member of club or organization: Not on file    Attends meetings of clubs or organizations: Not on file    Relationship status: Not on file  Other Topics Concern  . Not on file  Social History Narrative   Patient does not get regular exercise   Daily Caffeine use - 6      Wife decease 07/07/05;         Family History: The patient's ***family history includes Coronary artery disease in an other family member; Depression in his sister; Heart disease in his sister; Heart disease (age of onset: 63) in his mother; Hypertension in an other family member; Mental illness in his father. There is no history of Colon cancer, Stomach cancer, Esophageal cancer, Liver disease, Pancreatic cancer, or Rectal cancer.  ROS:   Please see the history of present illness.    *** All other systems reviewed and are negative.  EKGs/Labs/Other Studies Reviewed:    The following studies were reviewed today: ***  EKG:  EKG is *** ordered today.  The ekg ordered today demonstrates ***  Recent Labs: 09/24/2017: B Natriuretic Peptide 183.9 11/18/2017: Hemoglobin 11.6; Platelets 164.0 01/08/2018: Pro B Natriuretic peptide (BNP) 157.0; TSH 0.74 01/21/2018: BUN 16; Creatinine, Ser 1.38; Potassium 3.7; Sodium 147  Recent Lipid Panel    Component Value Date/Time   CHOL 102 07/25/2017 0911   TRIG 78.0 07/25/2017 0911   TRIG 109 01/28/2006 0841   HDL 33.20 (L) 07/25/2017 0911   CHOLHDL 3 07/25/2017 0911   VLDL 15.6 07/25/2017 0911   LDLCALC 53 07/25/2017 0911    Physical Exam:    VS:  There were no vitals taken for this visit.    Wt Readings from Last 3 Encounters:  02/03/18 191 lb (86.6 kg)  01/30/18 191 lb (86.6 kg)  01/21/18 191 lb (86.6 kg)     GEN: *** Well nourished, well developed in no acute distress HEENT: Normal NECK: No JVD; No carotid bruits LYMPHATICS: No lymphadenopathy CARDIAC: ***RRR, no murmurs,  rubs, gallops RESPIRATORY:  Clear to auscultation without rales, wheezing or rhonchi  ABDOMEN: Soft, non-tender, non-distended MUSCULOSKELETAL:  No edema; No deformity  SKIN: Warm and dry NEUROLOGIC:  Alert and oriented x 3 PSYCHIATRIC:  Normal affect   ASSESSMENT:    No diagnosis found. PLAN:    In order of problems listed above:  1. ***   Medication Adjustments/Labs and Tests Ordered: Current medicines are reviewed at length with the patient today.  Concerns regarding medicines are outlined above.  No orders of the defined types were placed in this encounter.  No orders of the defined types were placed in this encounter.   There are no Patient Instructions on file for this visit.   Signed, Sherren Mocha, MD  05/11/2018  2:30 PM    [facility]

## 2018-05-12 ENCOUNTER — Ambulatory Visit: Payer: Medicare Other | Admitting: Cardiovascular Disease

## 2018-06-03 ENCOUNTER — Encounter: Payer: Self-pay | Admitting: Physician Assistant

## 2018-06-03 ENCOUNTER — Ambulatory Visit: Payer: Medicare HMO | Admitting: Physician Assistant

## 2018-06-03 VITALS — BP 150/60 | HR 67 | Ht 71.0 in | Wt 191.1 lb

## 2018-06-03 DIAGNOSIS — N183 Chronic kidney disease, stage 3 unspecified: Secondary | ICD-10-CM

## 2018-06-03 DIAGNOSIS — E785 Hyperlipidemia, unspecified: Secondary | ICD-10-CM

## 2018-06-03 DIAGNOSIS — I10 Essential (primary) hypertension: Secondary | ICD-10-CM

## 2018-06-03 DIAGNOSIS — I5032 Chronic diastolic (congestive) heart failure: Secondary | ICD-10-CM | POA: Diagnosis not present

## 2018-06-03 DIAGNOSIS — I2510 Atherosclerotic heart disease of native coronary artery without angina pectoris: Secondary | ICD-10-CM | POA: Diagnosis not present

## 2018-06-03 DIAGNOSIS — J449 Chronic obstructive pulmonary disease, unspecified: Secondary | ICD-10-CM | POA: Diagnosis not present

## 2018-06-03 NOTE — Progress Notes (Signed)
Cardiology Office Note:    Date:  06/03/2018   ID:  Harold Pittman, DOB [birth year], MRN [id number]  PCP:  Cassandria Anger, MD  Cardiologist:  Sherren Mocha, MD   Electrophysiologist:  None   Referring MD: Cassandria Anger, MD   Chief Complaint  Patient presents with  . Follow-up    CAD     History of Present Illness:    Harold Pittman is a 78 y.o. male with CAD s/p CABG in 1997, hypertension, hyperlipidemia, chronic dyspnea, pulmonary HTN noted on prior echo.  He last saw Dr. Burt Knack in 2016.  He was evaluated by Leanor Kail, PA-C in 01/2018 for dyspnea and LE edema.  His PCP had changed his Furosemide to Torsemide prior to that visit.   A follow up echocardiogram demonstrated normal ejection fraction and mild diastolic dysfunction.  A follow up Myoview demonstrated no ischemia.     Harold Pittman returns for follow up.  He is here alone.  He has not had any chest pain, paroxysmal nocturnal dyspnea, leg swelling or syncope.  He has chronic dyspnea without change.    Prior CV studies:   The following studies were reviewed today:  Echo 01/30/18 EF 60-65, no RWMA, Gr 1 DD, mild MR, normal RVSF, mod TR, PASP 35 (mild increase)  Myoview 01/30/18 Normal stress nuclear study with no ischemia or infarction.  Gated ejection fraction 63% with normal wall motion.  Echo 07/23/15 Mild conc LVH, EF 60-65, prob basal inf AK, normal diastolic function, mildly calc AV leaflets, mild MR, PASP 45 (mod pulmonary HTN)  Renal Art [place] 05/02/2011 Normal caliber abdominal aorta. Mod stenosis of celiac axis Normal renal arteries bilat Multiple R kidney cysts    Past Medical History:  Diagnosis Date  . Anemia    low iron  . Anxiety state, unspecified   . Blood in stool   . Chronic airway obstruction, not elsewhere classified   . Coronary artery disease   . Coronary atherosclerosis of artery bypass graft   . Dyspnea   . ED (erectile dysfunction)   . Esophageal reflux   . Gout,  unspecified   . Osteoarthrosis, unspecified whether generalized or localized, unspecified site   . Other and unspecified hyperlipidemia   . Other specified disorder of rectum and anus   . Personal history of tobacco use, presenting hazards to health   . Sleep apnea    does not use cpap  . Type II or unspecified type diabetes mellitus without mention of complication, not stated as uncontrolled   . Unspecified essential hypertension    Surgical Hx: The patient  has a past surgical history that includes Coronary artery bypass graft (97); Rotator cuff repair; Knee surgery; Esophagogastroduodenoscopy (egd) with propofol (N/A, 09/26/2017); Colonoscopy with propofol (N/A, 09/26/2017); biopsy (09/26/2017); polypectomy (09/26/2017); Esophagogastroduodenoscopy (egd) with propofol (N/A, 02/03/2018); submucosal injection (02/03/2018); and biopsy (02/03/2018).   Current Medications: Current Meds  Medication Sig  . amLODipine (NORVASC) 5 MG tablet Take 1 tablet (5 mg total) by mouth daily.  Marland Kitchen aspirin 81 MG EC tablet Take 81 mg by mouth daily.    . carvedilol (COREG) 25 MG tablet TAKE 1 TABLET BY MOUTH TWO  TIMES DAILY WITH MEALS  . Cholecalciferol 1000 UNITS tablet Take 2,000 Units by mouth daily.    . colchicine 0.6 MG tablet Take 1 tablet (0.6 mg total) by mouth 2 (two) times daily as needed.  . Cyanocobalamin (VITAMIN B-12) 1000 MCG SUBL Place 1 tablet (1,000 mcg total)  under the tongue daily.  Marland Kitchen docusate sodium (COLACE) 100 MG capsule Take 1 capsule (100 mg total) by mouth 2 (two) times daily.  . ferrous sulfate 325 (65 FE) MG tablet Take 1 tablet (325 mg total) by mouth daily.  Marland Kitchen guaiFENesin (MUCINEX) 600 MG 12 hr tablet Take 1 tablet (600 mg total) by mouth 2 (two) times daily as needed for cough or to loosen phlegm.  . hydrALAZINE (APRESOLINE) 50 MG tablet Take 50 mg by mouth 3 (three) times daily.  . irbesartan (AVAPRO) 150 MG tablet Take 1 tablet (150 mg total) by mouth daily.  Marland Kitchen loperamide  (IMODIUM) 2 MG capsule Take 1 capsule by mouth 4  times daily as needed for  diarrhea or loose stools  . metFORMIN (GLUCOPHAGE XR) 750 MG 24 hr tablet Take 1 tablet (750 mg total) by mouth daily with breakfast.  . omeprazole (PRILOSEC) 40 MG capsule Take 1 capsule (40 mg total) by mouth daily.  . pravastatin (PRAVACHOL) 80 MG tablet TAKE 1 TABLET BY MOUTH AT  BEDTIME  . ranitidine (ZANTAC) 300 MG tablet TAKE 1 TABLET BY MOUTH  EVERY EVENING  . tadalafil (ADCIRCA/CIALIS) 20 MG tablet Take 20 mg by mouth as needed for erectile dysfunction.  . torsemide (DEMADEX) 100 MG tablet Take 1 tablet (100 mg total) by mouth daily.  Marland Kitchen zolpidem (AMBIEN) 5 MG tablet Take 1 tablet (5 mg total) by mouth at bedtime as needed for sleep.  . [DISCONTINUED] hydrALAZINE (APRESOLINE) 50 MG tablet TAKE 1 TABLET BY MOUTH 3  TIMES DAILY (Patient taking differently: TAKE 1 TABLET(50mg ) BY MOUTH 3  TIMES DAILY)  . [DISCONTINUED] tadalafil (CIALIS) 20 MG tablet Take as directed     Allergies:   Benazepril   Social History   Tobacco Use  . Smoking status: Former Research scientist (life sciences)  . Smokeless tobacco: Never Used  . Tobacco comment: Stopped 1997  Substance Use Topics  . Alcohol use: No    Comment: Stopped 1997  . Drug use: No     Family Hx: The patient's family history includes Coronary artery disease in an other family member; Depression in his sister; Heart disease in his sister; Heart disease (age of onset: 77) in his mother; Hypertension in an other family member; Mental illness in his father. There is no history of Colon cancer, Stomach cancer, Esophageal cancer, Liver disease, Pancreatic cancer, or Rectal cancer.  ROS:   Please see the history of present illness.    ROS All other systems reviewed and are negative.   EKGs/Labs/Other Test Reviewed:    EKG:  EKG is  ordered today.  The ekg ordered today demonstrates normal sinus rhythm, heart rate 67, normal axis, nonspecific ST-T wave changes, no change from prior  tracing  Recent Labs: 09/24/2017: B Natriuretic Peptide 183.9 11/18/2017: Hemoglobin 11.6; Platelets 164.0 01/08/2018: Pro B Natriuretic peptide (BNP) 157.0; TSH 0.74 01/21/2018: BUN 16; Creatinine, Ser 1.38; Potassium 3.7; Sodium 147   Recent Lipid Panel Lab Results  Component Value Date/Time   CHOL 102 07/25/2017 09:11 AM   TRIG 78.0 07/25/2017 09:11 AM   TRIG 109 01/28/2006 08:41 AM   HDL 33.20 (L) 07/25/2017 09:11 AM   CHOLHDL 3 07/25/2017 09:11 AM   LDLCALC 53 07/25/2017 09:11 AM    Physical Exam:    VS:  BP (!) 150/60   Pulse 67   Ht 5\' 11"  (1.803 m)   Wt 191 lb 1.9 oz (86.7 kg)   SpO2 96%   BMI 26.66 kg/m  Wt Readings from Last 3 Encounters:  06/03/18 191 lb 1.9 oz (86.7 kg)  02/03/18 191 lb (86.6 kg)  01/30/18 191 lb (86.6 kg)     Physical Exam  Constitutional: He is oriented to person, place, and time. He appears well-developed and well-nourished. No distress.  HENT:  Head: Normocephalic and atraumatic.  Eyes: No scleral icterus.  Neck: Neck supple. No JVD present. No thyromegaly present.  Cardiovascular: Normal rate, regular rhythm, S1 normal and S2 normal.  No murmur heard. Pulmonary/Chest: Breath sounds normal. He has no rales.  Abdominal: Soft. There is no hepatomegaly.  Musculoskeletal:        General: No edema.  Lymphadenopathy:    He has no cervical adenopathy.  Neurological: He is alert and oriented to person, place, and time.  Skin: Skin is warm and dry.  Psychiatric: He has a normal mood and affect.    ASSESSMENT & PLAN:    Chronic diastolic CHF (congestive heart failure) (HCC)  Echocardiogram in October 2019 with normal LV function and mild diastolic dysfunction.  He is on high-dose torsemide.  His volume status appears stable.  His breathing is stable.  Continue current regimen.  Coronary artery disease involving native coronary artery of native heart without angina pectoris  History of CABG in 1997.  Recent nuclear stress test with no  ischemia or scar.  He denies anginal symptoms.  Continue aspirin, beta-blocker, statin.  Follow-up 1 year.  Essential hypertension Blood pressure above goal today.  However, it is usually close to goal or optimal.  Continue to monitor for now.  Continue current regimen.  CKD (chronic kidney disease), stage III (HCC) Recent creatinine stable.  Chronic obstructive pulmonary disease, unspecified COPD type ([place])  He quit smoking in 1997 after smoking for several years.  He was previously on albuterol and Qvar.  I suspect his shortness of breath is multifactorial but mainly related to prior history of smoking, especially with noted mild pulmonary hypertension on echocardiogram.  Dyslipidemia - LDL optimal on most recent lab work.  Continue current Rx.    Dispo:  Return in about 1 year (around 06/04/2019) for Routine Follow Up, w/ Dr. Burt Knack, or Richardson Dopp, PA-C.   Medication Adjustments/Labs and Tests Ordered: Current medicines are reviewed at length with the patient today.  Concerns regarding medicines are outlined above.  Tests Ordered: Orders Placed This Encounter  Procedures  . EKG 12-Lead   Medication Changes: No orders of the defined types were placed in this encounter.   Signed, Richardson Dopp, PA-C  06/03/2018 10:30 AM    [facility], [place], [place]  [address] Phone: [phone]; Fax: [phone]

## 2018-06-03 NOTE — Patient Instructions (Signed)
Medication Instructions:  Your physician recommends that you continue on your current medications as directed. Please refer to the Current Medication list given to you today.   If you need a refill on your cardiac medications before your next appointment, please call your pharmacy.   Lab work: NONE  If you have labs (blood work) drawn today and your tests are completely normal, you will receive your results only by: Marland Kitchen MyChart Message (if you have MyChart) OR . A paper copy in the mail If you have any lab test that is abnormal or we need to change your treatment, we will call you to review the results.  Testing/Procedures: NONE  Follow-Up: At [facility], you and your health needs are our priority.  As part of our continuing mission to provide you with exceptional heart care, we have created designated Provider Care Teams.  These Care Teams include your primary Cardiologist (physician) and Advanced Practice Providers (APPs -  Physician Assistants and Nurse Practitioners) who all work together to provide you with the care you need, when you need it. . You will need a follow up appointment in:  12 months.  Please call our office 2 months in advance to schedule this appointment.  You may see Sherren Mocha, MD or one of the following Advanced Practice Providers on your designated Care Team: . Richardson Dopp, PA-C . Vin Bhagat, PA-C . Daune Perch, NP  Any Other Special Instructions Will Be Listed Below (If Applicable).

## 2018-09-11 ENCOUNTER — Other Ambulatory Visit: Payer: Self-pay

## 2018-09-11 NOTE — Patient Outreach (Signed)
[facility]) Care Management  09/11/2018  Harold Pittman 04/15/1940 [id number]   Medication Adherence call to Mr. Harold Pittman  Patient did not answer phone busy all the time patient is past due on Irbesartan 150 mg,Prvastatin 80 mg,Metformin Er 750 under [place].   [place] Management Direct Dial [phone]  Fax [phone] [email or web address]

## 2018-09-25 ENCOUNTER — Other Ambulatory Visit: Payer: Self-pay

## 2018-09-25 MED ORDER — HYDRALAZINE HCL 50 MG PO TABS: 50.0000 mg | ORAL_TABLET | Freq: Three times a day (TID) | ORAL | 1 refills | Status: DC

## 2018-09-25 MED ORDER — PRAVASTATIN SODIUM 80 MG PO TABS: 80.0000 mg | ORAL_TABLET | Freq: Every day | ORAL | 3 refills | Status: DC

## 2018-09-25 MED ORDER — OMEPRAZOLE 40 MG PO CPDR: 40.0000 mg | DELAYED_RELEASE_CAPSULE | Freq: Every day | ORAL | 1 refills | Status: DC

## 2018-10-08 ENCOUNTER — Encounter: Payer: Medicare HMO | Admitting: Internal Medicine

## 2018-10-09 DIAGNOSIS — E119 Type 2 diabetes mellitus without complications: Secondary | ICD-10-CM | POA: Diagnosis not present

## 2018-10-09 DIAGNOSIS — H10413 Chronic giant papillary conjunctivitis, bilateral: Secondary | ICD-10-CM | POA: Diagnosis not present

## 2018-10-09 DIAGNOSIS — H04123 Dry eye syndrome of bilateral lacrimal glands: Secondary | ICD-10-CM | POA: Diagnosis not present

## 2018-10-09 DIAGNOSIS — H2513 Age-related nuclear cataract, bilateral: Secondary | ICD-10-CM | POA: Diagnosis not present

## 2018-10-20 ENCOUNTER — Other Ambulatory Visit: Payer: Self-pay

## 2018-10-20 ENCOUNTER — Ambulatory Visit (INDEPENDENT_AMBULATORY_CARE_PROVIDER_SITE_OTHER): Payer: Medicare Other | Admitting: Internal Medicine

## 2018-10-20 ENCOUNTER — Other Ambulatory Visit (INDEPENDENT_AMBULATORY_CARE_PROVIDER_SITE_OTHER): Payer: Medicare Other

## 2018-10-20 ENCOUNTER — Encounter: Payer: Self-pay | Admitting: Internal Medicine

## 2018-10-20 VITALS — BP 140/66 | HR 67 | Temp 98.0°F | Ht 71.0 in | Wt 189.0 lb

## 2018-10-20 DIAGNOSIS — E1151 Type 2 diabetes mellitus with diabetic peripheral angiopathy without gangrene: Secondary | ICD-10-CM

## 2018-10-20 DIAGNOSIS — Z0000 Encounter for general adult medical examination without abnormal findings: Secondary | ICD-10-CM

## 2018-10-20 DIAGNOSIS — R600 Localized edema: Secondary | ICD-10-CM

## 2018-10-20 DIAGNOSIS — I10 Essential (primary) hypertension: Secondary | ICD-10-CM

## 2018-10-20 DIAGNOSIS — I2510 Atherosclerotic heart disease of native coronary artery without angina pectoris: Secondary | ICD-10-CM | POA: Diagnosis not present

## 2018-10-20 DIAGNOSIS — N481 Balanitis: Secondary | ICD-10-CM | POA: Diagnosis not present

## 2018-10-20 LAB — CBC WITH DIFFERENTIAL/PLATELET
Basophils Absolute: 0 10*3/uL (ref 0.0–0.1)
Basophils Relative: 0.3 % (ref 0.0–3.0)
Eosinophils Absolute: 0.4 10*3/uL (ref 0.0–0.7)
Eosinophils Relative: 5.3 % — ABNORMAL HIGH (ref 0.0–5.0)
HCT: 36 % — ABNORMAL LOW (ref 39.0–52.0)
Hemoglobin: 11.9 g/dL — ABNORMAL LOW (ref 13.0–17.0)
Lymphocytes Relative: 29.2 % (ref 12.0–46.0)
Lymphs Abs: 2 10*3/uL (ref 0.7–4.0)
MCHC: 33 g/dL (ref 30.0–36.0)
MCV: 82.6 fl (ref 78.0–100.0)
Monocytes Absolute: 0.8 10*3/uL (ref 0.1–1.0)
Monocytes Relative: 11.5 % (ref 3.0–12.0)
Neutro Abs: 3.7 10*3/uL (ref 1.4–7.7)
Neutrophils Relative %: 53.7 % (ref 43.0–77.0)
Platelets: 222 10*3/uL (ref 150.0–400.0)
RBC: 4.36 Mil/uL (ref 4.22–5.81)
RDW: 15.3 % (ref 11.5–15.5)
WBC: 6.9 10*3/uL (ref 4.0–10.5)

## 2018-10-20 LAB — HEMOGLOBIN A1C: Hgb A1c MFr Bld: 5.9 % (ref 4.6–6.5)

## 2018-10-20 MED ORDER — CHLORHEXIDINE GLUCONATE 4 % EX LIQD: Freq: Every day | CUTANEOUS | 1 refills | Status: DC | PRN

## 2018-10-20 MED ORDER — ONETOUCH VERIO W/DEVICE KIT: 1.0000 [IU] | PACK | Freq: Every day | 1 refills | Status: DC | PRN

## 2018-10-20 MED ORDER — ONETOUCH VERIO VI STRP: ORAL_STRIP | 11 refills | Status: DC

## 2018-10-20 MED ORDER — ONETOUCH ULTRASOFT LANCETS MISC: 12 refills | Status: DC

## 2018-10-20 NOTE — Assessment & Plan Note (Addendum)
We discussed age appropriate health related issues, including available/recomended screening tests and vaccinations. We discussed a need for adhering to healthy diet and exercise. Labs were ordered to be later reviewed . All questions were answered. Last colon 2019

## 2018-10-20 NOTE — Assessment & Plan Note (Signed)
Metformin, Pravastatin, ASA, Coreg

## 2018-10-20 NOTE — Patient Instructions (Addendum)
If you have medicare related insurance (such as traditional Medicare, [employer], [employer], or similar), Please make an appointment at the scheduling desk with Sharee Pimple, the [employer], for your Wellness visit in this office, which is a benefit with your insurance.   Trekking poles for walking

## 2018-10-20 NOTE — Assessment & Plan Note (Signed)
Coreg, Hydralazine, Lasix,  Irbesartan

## 2018-10-20 NOTE — Assessment & Plan Note (Signed)
ASA, Pravachol, Coreg Norvasc Rx No angina

## 2018-10-20 NOTE — Assessment & Plan Note (Signed)
Mild Hibiclens soap prn

## 2018-10-20 NOTE — Progress Notes (Signed)
Subjective:  Patient ID: Harold Pittman, male    DOB: January 08, 1941  Age: 78 y.o. MRN: [id number]  CC: No chief complaint on file.   HPI Gustabo Gordillo Vollmer presents for a well exam C/o occ ankle swelling at times C/o foreskin odor at times  Outpatient Medications Prior to Visit  Medication Sig Dispense Refill  . amLODipine (NORVASC) 5 MG tablet Take 1 tablet (5 mg total) by mouth daily.    Marland Kitchen aspirin 81 MG EC tablet Take 81 mg by mouth daily.      . carvedilol (COREG) 25 MG tablet TAKE 1 TABLET BY MOUTH TWO  TIMES DAILY WITH MEALS 180 tablet 3  . Cholecalciferol 1000 UNITS tablet Take 2,000 Units by mouth daily.      . colchicine 0.6 MG tablet Take 1 tablet (0.6 mg total) by mouth 2 (two) times daily as needed. 60 tablet 3  . Cyanocobalamin (VITAMIN B-12) 1000 MCG SUBL Place 1 tablet (1,000 mcg total) under the tongue daily. 100 tablet 3  . docusate sodium (COLACE) 100 MG capsule Take 1 capsule (100 mg total) by mouth 2 (two) times daily. 10 capsule 0  . guaiFENesin (MUCINEX) 600 MG 12 hr tablet Take 1 tablet (600 mg total) by mouth 2 (two) times daily as needed for cough or to loosen phlegm. 14 tablet 0  . hydrALAZINE (APRESOLINE) 50 MG tablet Take 1 tablet (50 mg total) by mouth 3 (three) times daily. 270 tablet 1  . irbesartan (AVAPRO) 150 MG tablet Take 1 tablet (150 mg total) by mouth daily. 90 tablet 3  . loperamide (IMODIUM) 2 MG capsule Take 1 capsule by mouth 4  times daily as needed for  diarrhea or loose stools 180 capsule 3  . metFORMIN (GLUCOPHAGE XR) 750 MG 24 hr tablet Take 1 tablet (750 mg total) by mouth daily with breakfast. 90 tablet 3  . omeprazole (PRILOSEC) 40 MG capsule Take 1 capsule (40 mg total) by mouth daily. 90 capsule 1  . pravastatin (PRAVACHOL) 80 MG tablet Take 1 tablet (80 mg total) by mouth at bedtime. 90 tablet 3  . ranitidine (ZANTAC) 300 MG tablet TAKE 1 TABLET BY MOUTH  EVERY EVENING 90 tablet 3  . tadalafil (ADCIRCA/CIALIS) 20 MG tablet Take 20 mg by  mouth as needed for erectile dysfunction.    . torsemide (DEMADEX) 100 MG tablet Take 1 tablet (100 mg total) by mouth daily. 30 tablet 11  . zolpidem (AMBIEN) 5 MG tablet Take 1 tablet (5 mg total) by mouth at bedtime as needed for sleep. 30 tablet 5  . ferrous sulfate 325 (65 FE) MG tablet Take 1 tablet (325 mg total) by mouth daily. 90 tablet 3   No facility-administered medications prior to visit.     ROS: Review of Systems  Constitutional: Negative for appetite change, fatigue and unexpected weight change.  HENT: Negative for congestion, nosebleeds, sneezing, sore throat and trouble swallowing.   Eyes: Negative for itching and visual disturbance.  Respiratory: Negative for cough.   Cardiovascular: Negative for chest pain, palpitations and leg swelling.  Gastrointestinal: Negative for abdominal distention, blood in stool, diarrhea and nausea.  Genitourinary: Negative for frequency and hematuria.  Musculoskeletal: Positive for arthralgias and gait problem. Negative for back pain, joint swelling and neck pain.  Skin: Negative for rash.  Neurological: Negative for dizziness, tremors, speech difficulty and weakness.  Psychiatric/Behavioral: Negative for agitation, dysphoric mood, sleep disturbance and suicidal ideas. The patient is not nervous/anxious.  Objective:  BP 140/66 (BP Location: Left Arm, Patient Position: Sitting, Cuff Size: Large)   Pulse 67   Temp 98 F (36.7 C) (Oral)   Ht 5\' 11"  (1.803 m)   Wt 189 lb (85.7 kg)   SpO2 95%   BMI 26.36 kg/m   BP Readings from Last 3 Encounters:  10/20/18 140/66  06/03/18 (!) 150/60  02/03/18 (!) 117/59    Wt Readings from Last 3 Encounters:  10/20/18 189 lb (85.7 kg)  06/03/18 191 lb 1.9 oz (86.7 kg)  02/03/18 191 lb (86.6 kg)    Physical Exam Constitutional:      General: He is not in acute distress.    Appearance: He is well-developed.     Comments: NAD  Eyes:     Conjunctiva/sclera: Conjunctivae normal.      Pupils: Pupils are equal, round, and reactive to light.  Neck:     Musculoskeletal: Normal range of motion.     Thyroid: No thyromegaly.     Vascular: No JVD.  Cardiovascular:     Rate and Rhythm: Normal rate and regular rhythm.     Heart sounds: Normal heart sounds. No murmur. No friction rub. No gallop.   Pulmonary:     Effort: Pulmonary effort is normal. No respiratory distress.     Breath sounds: Normal breath sounds. No wheezing or rales.  Chest:     Chest wall: No tenderness.  Abdominal:     General: Bowel sounds are normal. There is no distension.     Palpations: Abdomen is soft. There is no mass.     Tenderness: There is no abdominal tenderness. There is no guarding or rebound.  Musculoskeletal: Normal range of motion.        General: No tenderness.  Lymphadenopathy:     Cervical: No cervical adenopathy.  Skin:    General: Skin is warm and dry.     Findings: No erythema or rash.  Neurological:     Mental Status: He is alert and oriented to person, place, and time.     Cranial Nerves: No cranial nerve deficit.     Motor: No abnormal muscle tone.     Coordination: Coordination abnormal.     Gait: Gait normal.     Deep Tendon Reflexes: Reflexes are normal and symmetric.  Psychiatric:        Behavior: Behavior normal.        Thought Content: Thought content normal.        Judgment: Judgment normal.   foreskin - retractable, no odor, rash, d/c Rectal - per GI No edema B LEs  Lab Results  Component Value Date   WBC 5.2 11/18/2017   HGB 11.6 (L) 11/18/2017   HCT 34.3 (L) 11/18/2017   PLT 164.0 11/18/2017   GLUCOSE 174 (H) 01/21/2018   CHOL 102 07/25/2017   TRIG 78.0 07/25/2017   HDL 33.20 (L) 07/25/2017   LDLCALC 53 07/25/2017   ALT 12 (L) 07/23/2015   AST 15 07/23/2015   NA 147 (H) 01/21/2018   K 3.7 01/21/2018   CL 100 01/21/2018   CREATININE 1.38 (H) 01/21/2018   BUN 16 01/21/2018   CO2 28 01/21/2018   TSH 0.74 01/08/2018   PSA 1.31 03/22/2010   INR  1.35 07/23/2015   HGBA1C 5.8 11/18/2017   MICROALBUR 38.7 (H) 11/24/2013    No results found.  Assessment & Plan:   There are no diagnoses linked to this encounter.   No orders of the defined  types were placed in this encounter.    Follow-up: No follow-ups on file.  Walker Kehr, MD

## 2018-10-21 ENCOUNTER — Other Ambulatory Visit: Payer: Self-pay | Admitting: Internal Medicine

## 2018-10-21 DIAGNOSIS — R972 Elevated prostate specific antigen [PSA]: Secondary | ICD-10-CM

## 2018-10-21 LAB — BASIC METABOLIC PANEL
BUN: 15 mg/dL (ref 6–23)
CO2: 31 mEq/L (ref 19–32)
Calcium: 9.3 mg/dL (ref 8.4–10.5)
Chloride: 106 mEq/L (ref 96–112)
Creatinine, Ser: 1.29 mg/dL (ref 0.40–1.50)
GFR: 65.26 mL/min (ref >60.00)
Glucose, Bld: 169 mg/dL — ABNORMAL HIGH (ref 70–99)
Potassium: 4.1 mEq/L (ref 3.5–5.1)
Sodium: 144 mEq/L (ref 135–145)

## 2018-10-21 LAB — LIPID PANEL
Cholesterol: 105 mg/dL (ref 0–200)
HDL: 31.3 mg/dL — ABNORMAL LOW (ref >39.00)
LDL Cholesterol: 45 mg/dL (ref 0–99)
NonHDL: 73.71
Total CHOL/HDL Ratio: 3
Triglycerides: 142 mg/dL (ref 0.0–149.0)
VLDL: 28.4 mg/dL (ref 0.0–40.0)

## 2018-10-21 LAB — URINALYSIS, ROUTINE W REFLEX MICROSCOPIC
Bilirubin Urine: NEGATIVE
Hgb urine dipstick: NEGATIVE
Ketones, ur: NEGATIVE
Leukocytes,Ua: NEGATIVE
Nitrite: NEGATIVE
RBC / HPF: NONE SEEN (ref 0–?)
Specific Gravity, Urine: 1.02 (ref 1.000–1.030)
Total Protein, Urine: 30 — AB
Urine Glucose: 100 — AB
Urobilinogen, UA: 0.2 (ref 0.0–1.0)
WBC, UA: NONE SEEN (ref 0–?)
pH: 5.5 (ref 5.0–8.0)

## 2018-10-21 LAB — HEPATIC FUNCTION PANEL
ALT: 11 U/L (ref 0–53)
AST: 16 U/L (ref 0–37)
Albumin: 4.6 g/dL (ref 3.5–5.2)
Alkaline Phosphatase: 84 U/L (ref 39–117)
Bilirubin, Direct: 0.1 mg/dL (ref 0.0–0.3)
Total Bilirubin: 0.5 mg/dL (ref 0.2–1.2)
Total Protein: 7 g/dL (ref 6.0–8.3)

## 2018-10-21 LAB — TSH: TSH: 0.71 u[IU]/mL (ref 0.35–4.50)

## 2018-10-21 LAB — PSA: PSA: 7.66 ng/mL — ABNORMAL HIGH (ref 0.10–4.00)

## 2018-11-03 ENCOUNTER — Other Ambulatory Visit: Payer: Self-pay | Admitting: Internal Medicine

## 2018-11-03 NOTE — Telephone Encounter (Signed)
Does Metformin ER need to be change?

## 2018-11-21 ENCOUNTER — Other Ambulatory Visit: Payer: Self-pay | Admitting: *Deleted

## 2018-11-21 MED ORDER — CARVEDILOL 25 MG PO TABS: 25.0000 mg | ORAL_TABLET | Freq: Two times a day (BID) | ORAL | 3 refills | Status: DC

## 2018-12-17 ENCOUNTER — Other Ambulatory Visit: Payer: Self-pay | Admitting: Internal Medicine

## 2018-12-17 ENCOUNTER — Telehealth: Payer: Self-pay | Admitting: Internal Medicine

## 2018-12-17 NOTE — Telephone Encounter (Signed)
Tried calling patient x3 to get update on condition on gout, please advise

## 2018-12-17 NOTE — Telephone Encounter (Signed)
RX REFILL -Col[place]icine .Marland Kitchen Do not see this medication on patients chart, Patient said it is an old medication  [facility], [place] [phone] (Phone) [phone] (Fax)   Patient call back [phone]

## 2018-12-18 MED ORDER — COLCHICINE 0.6 MG PO TABS: 0.6000 mg | ORAL_TABLET | Freq: Two times a day (BID) | ORAL | 3 refills | Status: DC | PRN

## 2018-12-18 NOTE — Telephone Encounter (Signed)
RX sent

## 2018-12-23 ENCOUNTER — Telehealth: Payer: Self-pay | Admitting: *Deleted

## 2018-12-23 NOTE — Telephone Encounter (Signed)
Received fax stating Ranitidine is d/c'd. Need new rx for Famotidine if appropriate. Please advise.

## 2018-12-24 MED ORDER — FAMOTIDINE 40 MG PO TABS: 40.0000 mg | ORAL_TABLET | Freq: Every day | ORAL | 3 refills | Status: DC

## 2018-12-24 NOTE — Telephone Encounter (Signed)
Done. Thanks.

## 2018-12-24 NOTE — Telephone Encounter (Signed)
Pt informed of below.  

## 2019-01-09 ENCOUNTER — Other Ambulatory Visit: Payer: Self-pay

## 2019-01-09 ENCOUNTER — Encounter: Payer: Self-pay | Admitting: Family

## 2019-01-09 ENCOUNTER — Ambulatory Visit (INDEPENDENT_AMBULATORY_CARE_PROVIDER_SITE_OTHER): Payer: Medicare Other | Admitting: Family

## 2019-01-09 ENCOUNTER — Inpatient Hospital Stay (HOSPITAL_COMMUNITY)
Admission: EM | Admit: 2019-01-09 | Discharge: 2019-01-12 | DRG: 305 | Disposition: A | Discharge status: Home / Self Care | Payer: Medicare Other | Attending: Family Medicine | Admitting: Family Medicine

## 2019-01-09 ENCOUNTER — Emergency Department (HOSPITAL_COMMUNITY): Payer: Medicare Other

## 2019-01-09 VITALS — BP 200/90 | HR 64 | Temp 98.7°F | Ht 71.0 in | Wt 189.5 lb

## 2019-01-09 DIAGNOSIS — Z20828 Contact with and (suspected) exposure to other viral communicable diseases: Secondary | ICD-10-CM | POA: Diagnosis present

## 2019-01-09 DIAGNOSIS — R269 Unspecified abnormalities of gait and mobility: Secondary | ICD-10-CM

## 2019-01-09 DIAGNOSIS — E785 Hyperlipidemia, unspecified: Secondary | ICD-10-CM | POA: Diagnosis not present

## 2019-01-09 DIAGNOSIS — M7989 Other specified soft tissue disorders: Secondary | ICD-10-CM | POA: Diagnosis not present

## 2019-01-09 DIAGNOSIS — F411 Generalized anxiety disorder: Secondary | ICD-10-CM | POA: Diagnosis present

## 2019-01-09 DIAGNOSIS — N183 Chronic kidney disease, stage 3 unspecified: Secondary | ICD-10-CM | POA: Diagnosis present

## 2019-01-09 DIAGNOSIS — Z8249 Family history of ischemic heart disease and other diseases of the circulatory system: Secondary | ICD-10-CM

## 2019-01-09 DIAGNOSIS — I130 Hypertensive heart and chronic kidney disease with heart failure and stage 1 through stage 4 chronic kidney disease, or unspecified chronic kidney disease: Secondary | ICD-10-CM | POA: Diagnosis not present

## 2019-01-09 DIAGNOSIS — Z79899 Other long term (current) drug therapy: Secondary | ICD-10-CM | POA: Diagnosis not present

## 2019-01-09 DIAGNOSIS — R778 Other specified abnormalities of plasma proteins: Secondary | ICD-10-CM

## 2019-01-09 DIAGNOSIS — I5032 Chronic diastolic (congestive) heart failure: Secondary | ICD-10-CM | POA: Diagnosis present

## 2019-01-09 DIAGNOSIS — Z86010 Personal history of colonic polyps: Secondary | ICD-10-CM | POA: Diagnosis not present

## 2019-01-09 DIAGNOSIS — M109 Gout, unspecified: Secondary | ICD-10-CM | POA: Diagnosis not present

## 2019-01-09 DIAGNOSIS — Z7982 Long term (current) use of aspirin: Secondary | ICD-10-CM | POA: Diagnosis not present

## 2019-01-09 DIAGNOSIS — J449 Chronic obstructive pulmonary disease, unspecified: Secondary | ICD-10-CM | POA: Diagnosis not present

## 2019-01-09 DIAGNOSIS — D631 Anemia in chronic kidney disease: Secondary | ICD-10-CM | POA: Diagnosis present

## 2019-01-09 DIAGNOSIS — Z7984 Long term (current) use of oral hypoglycemic drugs: Secondary | ICD-10-CM | POA: Diagnosis not present

## 2019-01-09 DIAGNOSIS — I2583 Coronary atherosclerosis due to lipid rich plaque: Secondary | ICD-10-CM | POA: Diagnosis not present

## 2019-01-09 DIAGNOSIS — I272 Pulmonary hypertension, unspecified: Secondary | ICD-10-CM | POA: Diagnosis not present

## 2019-01-09 DIAGNOSIS — R531 Weakness: Secondary | ICD-10-CM

## 2019-01-09 DIAGNOSIS — Z87891 Personal history of nicotine dependence: Secondary | ICD-10-CM

## 2019-01-09 DIAGNOSIS — R972 Elevated prostate specific antigen [PSA]: Secondary | ICD-10-CM | POA: Diagnosis present

## 2019-01-09 DIAGNOSIS — R609 Edema, unspecified: Secondary | ICD-10-CM | POA: Diagnosis not present

## 2019-01-09 DIAGNOSIS — I509 Heart failure, unspecified: Secondary | ICD-10-CM | POA: Diagnosis not present

## 2019-01-09 DIAGNOSIS — E876 Hypokalemia: Secondary | ICD-10-CM | POA: Diagnosis present

## 2019-01-09 DIAGNOSIS — Z818 Family history of other mental and behavioral disorders: Secondary | ICD-10-CM

## 2019-01-09 DIAGNOSIS — I10 Essential (primary) hypertension: Secondary | ICD-10-CM

## 2019-01-09 DIAGNOSIS — I160 Hypertensive urgency: Secondary | ICD-10-CM | POA: Diagnosis not present

## 2019-01-09 DIAGNOSIS — Z888 Allergy status to other drugs, medicaments and biological substances status: Secondary | ICD-10-CM | POA: Diagnosis not present

## 2019-01-09 DIAGNOSIS — M25471 Effusion, right ankle: Secondary | ICD-10-CM | POA: Diagnosis not present

## 2019-01-09 DIAGNOSIS — R296 Repeated falls: Secondary | ICD-10-CM | POA: Diagnosis not present

## 2019-01-09 DIAGNOSIS — I2510 Atherosclerotic heart disease of native coronary artery without angina pectoris: Secondary | ICD-10-CM | POA: Diagnosis not present

## 2019-01-09 DIAGNOSIS — I5031 Acute diastolic (congestive) heart failure: Secondary | ICD-10-CM | POA: Diagnosis not present

## 2019-01-09 DIAGNOSIS — E119 Type 2 diabetes mellitus without complications: Secondary | ICD-10-CM | POA: Diagnosis not present

## 2019-01-09 DIAGNOSIS — E1122 Type 2 diabetes mellitus with diabetic chronic kidney disease: Secondary | ICD-10-CM | POA: Diagnosis not present

## 2019-01-09 DIAGNOSIS — S99911A Unspecified injury of right ankle, initial encounter: Secondary | ICD-10-CM | POA: Diagnosis not present

## 2019-01-09 DIAGNOSIS — I25810 Atherosclerosis of coronary artery bypass graft(s) without angina pectoris: Secondary | ICD-10-CM | POA: Diagnosis not present

## 2019-01-09 DIAGNOSIS — Z820 Family history of epilepsy and other diseases of the nervous system: Secondary | ICD-10-CM

## 2019-01-09 DIAGNOSIS — K219 Gastro-esophageal reflux disease without esophagitis: Secondary | ICD-10-CM | POA: Diagnosis not present

## 2019-01-09 DIAGNOSIS — D649 Anemia, unspecified: Secondary | ICD-10-CM | POA: Diagnosis not present

## 2019-01-09 DIAGNOSIS — Z03818 Encounter for observation for suspected exposure to other biological agents ruled out: Secondary | ICD-10-CM | POA: Diagnosis not present

## 2019-01-09 DIAGNOSIS — S6992XA Unspecified injury of left wrist, hand and finger(s), initial encounter: Secondary | ICD-10-CM | POA: Diagnosis not present

## 2019-01-09 LAB — CBC
HCT: 38.1 % — ABNORMAL LOW (ref 39.0–52.0)
Hemoglobin: 12.3 g/dL — ABNORMAL LOW (ref 13.0–17.0)
MCH: 27.2 pg (ref 26.0–34.0)
MCHC: 32.3 g/dL (ref 30.0–36.0)
MCV: 84.1 fL (ref 80.0–100.0)
Platelets: 203 10*3/uL (ref 150–400)
RBC: 4.53 MIL/uL (ref 4.22–5.81)
RDW: 14.6 % (ref 11.5–15.5)
WBC: 5.5 10*3/uL (ref 4.0–10.5)
nRBC: 0 % (ref 0.0–0.2)

## 2019-01-09 LAB — BASIC METABOLIC PANEL
Anion gap: 13 (ref 5–15)
BUN: 14 mg/dL (ref 8–23)
CO2: 26 mmol/L (ref 22–32)
Calcium: 9.4 mg/dL (ref 8.9–10.3)
Chloride: 105 mmol/L (ref 98–111)
Creatinine, Ser: 1.25 mg/dL — ABNORMAL HIGH (ref 0.61–1.24)
GFR calc Af Amer: >60 mL/min (ref >60)
GFR calc non Af Amer: 55 mL/min — ABNORMAL LOW (ref >60)
Glucose, Bld: 106 mg/dL — ABNORMAL HIGH (ref 70–99)
Potassium: 3.9 mmol/L (ref 3.5–5.1)
Sodium: 144 mmol/L (ref 135–145)

## 2019-01-09 NOTE — ED Triage Notes (Signed)
Pt to ER states sent from cardiology for CHF and swelling. Also noted to be hypertensive. States he is not short of breath. Does have lower extremity edema.

## 2019-01-09 NOTE — Progress Notes (Signed)
Harold Pittman is a 78 y.o. male with the following history as recorded in EpicCare:  Patient Active Problem List   Diagnosis Date Noted  . Well adult exam 10/20/2018  . Balanitis 10/20/2018  . 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/27/2017  . Adenoma of stomach 12/25/2017  . Abnormal findings on esophagogastroduodenoscopy (EGD) 12/25/2017  . Sebaceous cyst 10/04/2017  . Iron deficiency anemia   . Benign neoplasm of sigmoid colon   . Benign neoplasm of cecum   . Benign neoplasm of transverse colon   . Positive occult stool blood test 09/24/2017  . CKD (chronic kidney disease), stage III 09/24/2017  . Chronic diastolic CHF (congestive heart failure) ([place]) 09/24/2017  . Generalized weakness 07/22/2015  . Gait disorder 03/18/2014  . Low back pain 01/12/2014  . History of colon polyps 12/17/2013  . DM (diabetes mellitus), type 2 with peripheral vascular complications ([place]) 52/77/8242  . Diarrhea 10/08/2012  . Edema 10/08/2011  . DOE (dyspnea on exertion) 06/29/2011  . LATERAL EPICONDYLITIS, LEFT 02/20/2010  . TOBACCO USE, QUIT 02/23/2009  . Cough 02/16/2009  . DECREASED HEARING 10/15/2008  . ERECTILE DYSFUNCTION 04/30/2008  . CAD (coronary artery disease) 04/30/2008  . COPD (chronic obstructive pulmonary disease) ([place]) 05/21/2007  . Anxiety state 05/20/2007  . INSOMNIA, PERSISTENT 03/04/2007  . Dyslipidemia 02/01/2007  . Gout 02/01/2007  . Essential hypertension 02/01/2007  . GERD 02/01/2007  . Osteoarthritis 02/01/2007    Current Outpatient Medications  Medication Sig Dispense Refill  . amLODipine (NORVASC) 5 MG tablet Take 1 tablet (5 mg total) by mouth daily.    Marland Kitchen aspirin 81 MG EC tablet Take 81 mg by mouth daily.      . Blood Glucose Monitoring Suppl (ONETOUCH VERIO) w/Device KIT 1 Units by Does not apply route daily as needed. 1 kit 1  . carvedilol (COREG) 25 MG tablet Take 1 tablet (25 mg total) by mouth 2 (two) times daily with a meal. 180 tablet 3  .  chlorhexidine (HIBICLENS) 4 % external liquid Apply topically daily as needed. 236 mL 1  . Cholecalciferol 1000 UNITS tablet Take 2,000 Units by mouth daily.      . colchicine 0.6 MG tablet Take 1 tablet (0.6 mg total) by mouth 2 (two) times daily as needed. 60 tablet 3  . Cyanocobalamin (VITAMIN B-12) 1000 MCG SUBL Place 1 tablet (1,000 mcg total) under the tongue daily. 100 tablet 3  . docusate sodium (COLACE) 100 MG capsule Take 1 capsule (100 mg total) by mouth 2 (two) times daily. 10 capsule 0  . famotidine (PEPCID) 40 MG tablet Take 1 tablet (40 mg total) by mouth daily. 90 tablet 3  . glucose blood (ONETOUCH VERIO) test strip Use as instructed qd - bid 100 each 11  . hydrALAZINE (APRESOLINE) 50 MG tablet Take 1 tablet (50 mg total) by mouth 3 (three) times daily. 270 tablet 1  . irbesartan (AVAPRO) 150 MG tablet TAKE 1 TABLET BY MOUTH  DAILY 90 tablet 3  . Lancets (ONETOUCH ULTRASOFT) lancets Use as instructed 100 each 12  . metFORMIN (GLUCOPHAGE-XR) 750 MG 24 hr tablet TAKE 1 TABLET BY MOUTH  DAILY WITH BREAKFAST 90 tablet 3  . omeprazole (PRILOSEC) 40 MG capsule Take 1 capsule (40 mg total) by mouth daily. 90 capsule 1  . pravastatin (PRAVACHOL) 80 MG tablet Take 1 tablet (80 mg total) by mouth at bedtime. 90 tablet 3  . tadalafil (ADCIRCA/CIALIS) 20 MG tablet Take 20 mg by mouth as needed for  erectile dysfunction.    . torsemide (DEMADEX) 100 MG tablet Take 1 tablet (100 mg total) by mouth daily. 30 tablet 11   No current facility-administered medications for this visit.     Allergies: Benazepril  Past Medical History:  Diagnosis Date  . Anemia    low iron  . Anxiety state, unspecified   . Blood in stool   . Chronic airway obstruction, not elsewhere classified   . Coronary artery disease   . Coronary atherosclerosis of artery bypass graft   . Dyspnea   . ED (erectile dysfunction)   . Esophageal reflux   . Gout, unspecified   . Osteoarthrosis, unspecified whether generalized  or localized, unspecified site   . Other and unspecified hyperlipidemia   . Other specified disorder of rectum and anus   . Personal history of tobacco use, presenting hazards to health   . Sleep apnea    does not use cpap  . Type II or unspecified type diabetes mellitus without mention of complication, not stated as uncontrolled   . Unspecified essential hypertension     Past Surgical History:  Procedure Laterality Date  . BIOPSY  09/26/2017   Procedure: BIOPSY;  Surgeon: Ladene Artist, MD;  Location: [facility] ENDOSCOPY;  Service: Endoscopy;;  . BIOPSY  02/03/2018   Procedure: BIOPSY;  Surgeon: Irving Copas., MD;  Location: [place];  Service: Gastroenterology;;  . COLONOSCOPY WITH PROPOFOL N/A 09/26/2017   Procedure: COLONOSCOPY WITH PROPOFOL;  Surgeon: Ladene Artist, MD;  Location: [facility] ENDOSCOPY;  Service: Endoscopy;  Laterality: N/A;  . CORONARY ARTERY BYPASS GRAFT  97   LIMA to LAD, sequential saphenous vein graft to the first and second diagonal, sequential saphenous vein graft to the intermediate OM and circumflex and SVG to RCA  . ESOPHAGOGASTRODUODENOSCOPY (EGD) WITH PROPOFOL N/A 09/26/2017   Procedure: ESOPHAGOGASTRODUODENOSCOPY (EGD) WITH PROPOFOL;  Surgeon: Ladene Artist, MD;  Location[facility] ENDOSCOPY;  Service: Endoscopy;  Laterality: N/A;  . ESOPHAGOGASTRODUODENOSCOPY (EGD) WITH PROPOFOL N/A 02/03/2018   Procedure: ESOPHAGOGASTRODUODENOSCOPY (EGD);  Surgeon: Irving Copas., MD;  Location: [place];  Service: Gastroenterology;  Laterality: N/A;  . KNEE SURGERY     BILATERAL  . POLYPECTOMY  09/26/2017   Procedure: POLYPECTOMY;  Surgeon: Ladene Artist, MD;  Location: [facility] ENDOSCOPY;  Service: Endoscopy;;  . ROTATOR CUFF REPAIR    . SUBMUCOSAL INJECTION  02/03/2018   Procedure: SUBMUCOSAL INJECTION;  Surgeon: Rush Landmark Telford Nab., MD;  Location: [facility] ENDOSCOPY;  Service: Gastroenterology;;    Family History  Problem Relation Age of Onset  .  Depression Sister   . Heart disease Sister   . Heart disease Mother 84       CAD  . Mental illness Father        Alzheimer's  . Hypertension Other   . Coronary artery disease Other        Male 1st degree relative <50  . Colon cancer Neg Hx   . Stomach cancer Neg Hx   . Esophageal cancer Neg Hx   . Liver disease Neg Hx   . Pancreatic cancer Neg Hx   . Rectal cancer Neg Hx     Social History   Tobacco Use  . Smoking status: Former Research scientist (life sciences)  . Smokeless tobacco: Never Used  . Tobacco comment: Stopped 1997  Substance Use Topics  . Alcohol use: No    Comment: Stopped 1997    Subjective:  Patient originally scheduled appointment due to concerns about unexplained swelling in his  left arm; is very surprised to see how elevated his blood pressure is today; denies any chest pain but notes that he has been feeling more short of breath with activity lately. Does have history of heart failure;    Objective:  Vitals:   01/09/19 1304  BP: (!) 200/90  Pulse: 64  Temp: 98.7 F (37.1 C)  TempSrc: Oral  SpO2: 96%  Weight: 189 lb 8 oz (86 kg)  Height: 5' 11"  (1.803 m)    General: Well developed, well nourished, in no acute distress  Skin : Warm and dry.  Head: Normocephalic and atraumatic  Eyes: Sclera and conjunctiva clear; pupils round and reactive to light; extraocular movements intact  Ears: External normal; canals clear; tympanic membranes normal  Oropharynx: Pink, supple. No suspicious lesions  Neck: Supple without thyromegaly, adenopathy  Lungs: Respirations unlabored; clear to auscultation bilaterally without wheeze, rales, rhonchi  CVS exam: normal rate and regular rhythm.  Extremities: swelling noted over left forearm/ left hand Vessels: Symmetric bilaterally  Neurologic: Alert and oriented; speech intact; face symmetrical; moves all extremities well; CNII-XII intact without focal deficit   Assessment:  1. Essential hypertension     Plan:  Patient's blood pressure was  re-checked on 2 separate occasions over the course of 30 minutes and consistently averaged 200/ 100; EKG done in office showed some concerning changes as well compared to previous EKGs; patient defers going to ER by ambulance but does have a family member transport him to [facility] ER for further evaluation. I also notified patient's cardiologist.    No follow-ups on file.  Orders Placed This Encounter  Procedures  . EKG 12-Lead    Requested Prescriptions    No prescriptions requested or ordered in this encounter

## 2019-01-09 NOTE — ED Notes (Signed)
Updated on wait for treatment room. 

## 2019-01-10 ENCOUNTER — Emergency Department (HOSPITAL_COMMUNITY): Payer: Medicare Other

## 2019-01-10 DIAGNOSIS — I5031 Acute diastolic (congestive) heart failure: Secondary | ICD-10-CM | POA: Diagnosis not present

## 2019-01-10 DIAGNOSIS — R609 Edema, unspecified: Secondary | ICD-10-CM

## 2019-01-10 DIAGNOSIS — J449 Chronic obstructive pulmonary disease, unspecified: Secondary | ICD-10-CM | POA: Diagnosis present

## 2019-01-10 DIAGNOSIS — I25810 Atherosclerosis of coronary artery bypass graft(s) without angina pectoris: Secondary | ICD-10-CM | POA: Diagnosis present

## 2019-01-10 DIAGNOSIS — M109 Gout, unspecified: Secondary | ICD-10-CM | POA: Diagnosis present

## 2019-01-10 DIAGNOSIS — I160 Hypertensive urgency: Secondary | ICD-10-CM | POA: Diagnosis present

## 2019-01-10 DIAGNOSIS — I5032 Chronic diastolic (congestive) heart failure: Secondary | ICD-10-CM | POA: Diagnosis present

## 2019-01-10 DIAGNOSIS — Z86010 Personal history of colonic polyps: Secondary | ICD-10-CM | POA: Diagnosis not present

## 2019-01-10 DIAGNOSIS — I2510 Atherosclerotic heart disease of native coronary artery without angina pectoris: Secondary | ICD-10-CM

## 2019-01-10 DIAGNOSIS — Z888 Allergy status to other drugs, medicaments and biological substances status: Secondary | ICD-10-CM | POA: Diagnosis not present

## 2019-01-10 DIAGNOSIS — E1122 Type 2 diabetes mellitus with diabetic chronic kidney disease: Secondary | ICD-10-CM | POA: Diagnosis present

## 2019-01-10 DIAGNOSIS — I2583 Coronary atherosclerosis due to lipid rich plaque: Secondary | ICD-10-CM

## 2019-01-10 DIAGNOSIS — E876 Hypokalemia: Secondary | ICD-10-CM | POA: Diagnosis present

## 2019-01-10 DIAGNOSIS — K219 Gastro-esophageal reflux disease without esophagitis: Secondary | ICD-10-CM | POA: Diagnosis present

## 2019-01-10 DIAGNOSIS — Z7982 Long term (current) use of aspirin: Secondary | ICD-10-CM | POA: Diagnosis not present

## 2019-01-10 DIAGNOSIS — R296 Repeated falls: Secondary | ICD-10-CM | POA: Diagnosis present

## 2019-01-10 DIAGNOSIS — I130 Hypertensive heart and chronic kidney disease with heart failure and stage 1 through stage 4 chronic kidney disease, or unspecified chronic kidney disease: Secondary | ICD-10-CM | POA: Diagnosis present

## 2019-01-10 DIAGNOSIS — Z79899 Other long term (current) drug therapy: Secondary | ICD-10-CM | POA: Diagnosis not present

## 2019-01-10 DIAGNOSIS — Z20828 Contact with and (suspected) exposure to other viral communicable diseases: Secondary | ICD-10-CM | POA: Diagnosis present

## 2019-01-10 DIAGNOSIS — E119 Type 2 diabetes mellitus without complications: Secondary | ICD-10-CM

## 2019-01-10 DIAGNOSIS — I272 Pulmonary hypertension, unspecified: Secondary | ICD-10-CM | POA: Diagnosis present

## 2019-01-10 DIAGNOSIS — F411 Generalized anxiety disorder: Secondary | ICD-10-CM | POA: Diagnosis present

## 2019-01-10 DIAGNOSIS — Z87891 Personal history of nicotine dependence: Secondary | ICD-10-CM | POA: Diagnosis not present

## 2019-01-10 DIAGNOSIS — I10 Essential (primary) hypertension: Secondary | ICD-10-CM

## 2019-01-10 DIAGNOSIS — M7989 Other specified soft tissue disorders: Secondary | ICD-10-CM | POA: Diagnosis present

## 2019-01-10 DIAGNOSIS — Z7984 Long term (current) use of oral hypoglycemic drugs: Secondary | ICD-10-CM | POA: Diagnosis not present

## 2019-01-10 DIAGNOSIS — E785 Hyperlipidemia, unspecified: Secondary | ICD-10-CM | POA: Diagnosis present

## 2019-01-10 DIAGNOSIS — D631 Anemia in chronic kidney disease: Secondary | ICD-10-CM | POA: Diagnosis present

## 2019-01-10 DIAGNOSIS — R972 Elevated prostate specific antigen [PSA]: Secondary | ICD-10-CM | POA: Diagnosis present

## 2019-01-10 DIAGNOSIS — D649 Anemia, unspecified: Secondary | ICD-10-CM

## 2019-01-10 DIAGNOSIS — N183 Chronic kidney disease, stage 3 unspecified: Secondary | ICD-10-CM | POA: Diagnosis present

## 2019-01-10 LAB — COMPREHENSIVE METABOLIC PANEL
ALT: 18 U/L (ref 0–44)
AST: 24 U/L (ref 15–41)
Albumin: 4.1 g/dL (ref 3.5–5.0)
Alkaline Phosphatase: 69 U/L (ref 38–126)
Anion gap: 13 (ref 5–15)
BUN: 14 mg/dL (ref 8–23)
CO2: 25 mmol/L (ref 22–32)
Calcium: 9.3 mg/dL (ref 8.9–10.3)
Chloride: 104 mmol/L (ref 98–111)
Creatinine, Ser: 1.22 mg/dL (ref 0.61–1.24)
GFR calc Af Amer: >60 mL/min (ref >60)
GFR calc non Af Amer: 57 mL/min — ABNORMAL LOW (ref >60)
Glucose, Bld: 172 mg/dL — ABNORMAL HIGH (ref 70–99)
Potassium: 3.2 mmol/L — ABNORMAL LOW (ref 3.5–5.1)
Sodium: 142 mmol/L (ref 135–145)
Total Bilirubin: 0.8 mg/dL (ref 0.3–1.2)
Total Protein: 6.9 g/dL (ref 6.5–8.1)

## 2019-01-10 LAB — TROPONIN I (HIGH SENSITIVITY)
Troponin I (High Sensitivity): 18 ng/L — ABNORMAL HIGH (ref <18)
Troponin I (High Sensitivity): 19 ng/L — ABNORMAL HIGH (ref <18)

## 2019-01-10 LAB — BRAIN NATRIURETIC PEPTIDE: B Natriuretic Peptide: 109.5 pg/mL — ABNORMAL HIGH (ref 0.0–100.0)

## 2019-01-10 LAB — GLUCOSE, CAPILLARY
Glucose-Capillary: 185 mg/dL — ABNORMAL HIGH (ref 70–99)
Glucose-Capillary: 93 mg/dL (ref 70–99)
Glucose-Capillary: 113 mg/dL — ABNORMAL HIGH (ref 70–99)

## 2019-01-10 LAB — TSH: TSH: 0.579 u[IU]/mL (ref 0.350–4.500)

## 2019-01-10 LAB — C-REACTIVE PROTEIN: CRP: <0.8 mg/dL (ref <1.0)

## 2019-01-10 LAB — SEDIMENTATION RATE: Sed Rate: 11 mm/hr (ref 0–16)

## 2019-01-10 LAB — SARS CORONAVIRUS 2 (TAT 6-24 HRS): SARS Coronavirus 2: NEGATIVE

## 2019-01-10 LAB — CBG MONITORING, ED: Glucose-Capillary: 208 mg/dL — ABNORMAL HIGH (ref 70–99)

## 2019-01-10 LAB — URIC ACID: Uric Acid, Serum: 8.7 mg/dL — ABNORMAL HIGH (ref 3.7–8.6)

## 2019-01-10 MED ORDER — CARVEDILOL 25 MG PO TABS
25.0000 mg | ORAL_TABLET | Freq: Two times a day (BID) | ORAL | Status: DC
  Administered 2019-01-10 – 2019-01-12 (×5): 25 mg via ORAL
  Filled 2019-01-10: qty 2
  Filled 2019-01-10 (×4): qty 1

## 2019-01-10 MED ORDER — ACETAMINOPHEN 325 MG PO TABS: 650.0000 mg | ORAL_TABLET | Freq: Four times a day (QID) | ORAL | Status: DC | PRN

## 2019-01-10 MED ORDER — TORSEMIDE 20 MG PO TABS
100.0000 mg | ORAL_TABLET | Freq: Every day | ORAL | Status: DC
  Administered 2019-01-10 – 2019-01-12 (×3): 100 mg via ORAL
  Filled 2019-01-10 (×3): qty 5

## 2019-01-10 MED ORDER — PRAVASTATIN SODIUM 40 MG PO TABS
80.0000 mg | ORAL_TABLET | Freq: Every day | ORAL | Status: DC
  Administered 2019-01-10 – 2019-01-11 (×2): 80 mg via ORAL
  Filled 2019-01-10 (×2): qty 2

## 2019-01-10 MED ORDER — IRBESARTAN 150 MG PO TABS
150.0000 mg | ORAL_TABLET | Freq: Every day | ORAL | Status: DC
  Administered 2019-01-10 – 2019-01-12 (×3): 150 mg via ORAL
  Filled 2019-01-10 (×3): qty 1

## 2019-01-10 MED ORDER — AMLODIPINE BESYLATE 10 MG PO TABS
10.0000 mg | ORAL_TABLET | Freq: Every day | ORAL | Status: DC
  Administered 2019-01-10 – 2019-01-12 (×3): 10 mg via ORAL
  Filled 2019-01-10: qty 1
  Filled 2019-01-10 (×2): qty 2

## 2019-01-10 MED ORDER — COLCHICINE 0.6 MG PO TABS: 0.6000 mg | ORAL_TABLET | Freq: Two times a day (BID) | ORAL | Status: DC | PRN

## 2019-01-10 MED ORDER — AMLODIPINE BESYLATE 5 MG PO TABS: 5.0000 mg | ORAL_TABLET | Freq: Every day | ORAL | Status: DC

## 2019-01-10 MED ORDER — HYDRALAZINE HCL 50 MG PO TABS
50.0000 mg | ORAL_TABLET | Freq: Three times a day (TID) | ORAL | Status: DC
  Administered 2019-01-10 – 2019-01-12 (×7): 50 mg via ORAL
  Filled 2019-01-10 (×7): qty 1

## 2019-01-10 MED ORDER — ONDANSETRON HCL 4 MG PO TABS: 4.0000 mg | ORAL_TABLET | Freq: Four times a day (QID) | ORAL | Status: DC | PRN

## 2019-01-10 MED ORDER — ALBUTEROL SULFATE (2.5 MG/3ML) 0.083% IN NEBU: 2.5000 mg | INHALATION_SOLUTION | Freq: Four times a day (QID) | RESPIRATORY_TRACT | Status: DC | PRN

## 2019-01-10 MED ORDER — ONDANSETRON HCL 4 MG/2ML IJ SOLN: 4.0000 mg | Freq: Four times a day (QID) | INTRAMUSCULAR | Status: DC | PRN

## 2019-01-10 MED ORDER — ENOXAPARIN SODIUM 40 MG/0.4ML ~~LOC~~ SOLN
40.0000 mg | SUBCUTANEOUS | Status: DC
  Administered 2019-01-10: 40 mg via SUBCUTANEOUS
  Filled 2019-01-10: qty 0.4

## 2019-01-10 MED ORDER — FAMOTIDINE 20 MG PO TABS
40.0000 mg | ORAL_TABLET | Freq: Every day | ORAL | Status: DC
  Administered 2019-01-10 – 2019-01-12 (×3): 40 mg via ORAL
  Filled 2019-01-10 (×3): qty 2

## 2019-01-10 MED ORDER — ACETAMINOPHEN 650 MG RE SUPP: 650.0000 mg | Freq: Four times a day (QID) | RECTAL | Status: DC | PRN

## 2019-01-10 MED ORDER — HYDRALAZINE HCL 20 MG/ML IJ SOLN
20.0000 mg | Freq: Once | INTRAMUSCULAR | Status: AC
  Administered 2019-01-10: 03:00:00 20 mg via INTRAVENOUS
  Filled 2019-01-10: qty 1

## 2019-01-10 MED ORDER — ASPIRIN EC 81 MG PO TBEC
81.0000 mg | DELAYED_RELEASE_TABLET | Freq: Every day | ORAL | Status: DC
  Administered 2019-01-10 – 2019-01-12 (×3): 81 mg via ORAL
  Filled 2019-01-10 (×3): qty 1

## 2019-01-10 MED ORDER — INSULIN ASPART 100 UNIT/ML ~~LOC~~ SOLN
0.0000 [IU] | Freq: Three times a day (TID) | SUBCUTANEOUS | Status: DC
  Administered 2019-01-10: 2 [IU] via SUBCUTANEOUS
  Administered 2019-01-10: 09:00:00 3 [IU] via SUBCUTANEOUS
  Administered 2019-01-11: 2 [IU] via SUBCUTANEOUS
  Administered 2019-01-11: 1 [IU] via SUBCUTANEOUS
  Administered 2019-01-12: 3 [IU] via SUBCUTANEOUS

## 2019-01-10 NOTE — H&P (Addendum)
History and Physical    Harold Pittman BMW:[id number] DOB: 1940-10-16 DOA: 01/09/2019  Referring MD/NP/PA:Arshad Nichola Sizer, MD PCP: Cassandria Anger, MD  Patient coming from: PCP office  Chief Complaint: Swelling  I have personally briefly reviewed patient's old medical records in [facility]   HPI: Harold Pittman is a 78 y.o. male with medical history significant of hypertension, hyperlipidemia, CAD s/p CABG in 2725, diastolic CHF( EF noted to be 31 to 65% with grade 1 DD), chronic dyspnea, pulmonary hypertension, diabetes mellitus type 2, anemia, gout, gastric adenoma, and GERD; who initially presented to his primary care office with complaints of swelling in his left hand and right ankle over the last week.  [name] lives alone and is normally able to complete all of his ADLs. He denies any recent trauma or falls to his knowledge that could have onset symptoms.  Vessels were taken from the right leg for his CABG back in 1997, but he does not normally reports swelling.  Denies having any pain, redness, or increased warmth in the affected extremities.  His range of motion is just mildly decreased in the thumb of his left hand.  He has been able to ambulate without significant difficulty.  Associated symptoms include a history of shortness of breath with exertion that has been present for a long time and unchanged.  Notes that walking to his mailbox or doing lots of stories makes him short of breath and have to rest.  Patient reports previous history of gout, but denies this feeling like a flare.  Denies having any significant fever, chest pain, orthopnea, cough, nausea, vomiting, or change in weight.  [name] reports 100% compliance with all of his medications and took his morning meds prior to going into his PCPs office.  However, his blood pressures were noted to be 200/90, and he was sent to the emergency department for further evaluation.   ED Course: Upon admission into the  emergency department patient was noted to be afebrile, pulse 50-76, respiration 14-27, blood pressure elevated up to 225/91, and O2 saturations maintained on room air.  Labs significant for hemoglobin 12.3, creatinine 1.25, BNP 109.5, and troponin 18->19.  X-rays of the left hand noted osteoarthritis at the second PIP joint but no signs of any acute fracture.  X-rays of the right ankle noted osteoarthritis and previous surgical clips.  Chest x-ray noted stable cardiomyopathy without acute findings.  Patient was given 20 mg of hydralazine IV for elevated blood pressures.  TRH called to admit.   Review of Systems  Constitutional: Negative for fever and weight loss.  HENT: Negative for ear discharge and nosebleeds.   Eyes: Negative for photophobia and pain.  Respiratory: Positive for shortness of breath. Negative for cough.   Cardiovascular: Positive for leg swelling. Negative for chest pain.  Gastrointestinal: Negative for abdominal pain, nausea and vomiting.  Genitourinary: Negative for dysuria and hematuria.  Musculoskeletal: Negative for falls, joint pain and myalgias.  Skin: Negative for rash.  Neurological: Negative for focal weakness and loss of consciousness.  Psychiatric/Behavioral: Negative for memory loss and substance abuse.    Past Medical History:  Diagnosis Date  . Anemia    low iron  . Anxiety state, unspecified   . Blood in stool   . Chronic airway obstruction, not elsewhere classified   . Coronary artery disease   . Coronary atherosclerosis of artery bypass graft   . Dyspnea   . ED (erectile dysfunction)   . Esophageal reflux   .  Gout, unspecified   . Osteoarthrosis, unspecified whether generalized or localized, unspecified site   . Other and unspecified hyperlipidemia   . Other specified disorder of rectum and anus   . Personal history of tobacco use, presenting hazards to health   . Sleep apnea    does not use cpap  . Type II or unspecified type diabetes mellitus  without mention of complication, not stated as uncontrolled   . Unspecified essential hypertension     Past Surgical History:  Procedure Laterality Date  . BIOPSY  09/26/2017   Procedure: BIOPSY;  Surgeon: Ladene Artist, MD;  Location: [facility] ENDOSCOPY;  Service: Endoscopy;;  . BIOPSY  02/03/2018   Procedure: BIOPSY;  Surgeon: Irving Copas., MD;  Location: [place];  Service: Gastroenterology;;  . COLONOSCOPY WITH PROPOFOL N/A 09/26/2017   Procedure: COLONOSCOPY WITH PROPOFOL;  Surgeon: Ladene Artist, MD;  Location: [facility] ENDOSCOPY;  Service: Endoscopy;  Laterality: N/A;  . CORONARY ARTERY BYPASS GRAFT  97   LIMA to LAD, sequential saphenous vein graft to the first and second diagonal, sequential saphenous vein graft to the intermediate OM and circumflex and SVG to RCA  . ESOPHAGOGASTRODUODENOSCOPY (EGD) WITH PROPOFOL N/A 09/26/2017   Procedure: ESOPHAGOGASTRODUODENOSCOPY (EGD) WITH PROPOFOL;  Surgeon: Ladene Artist, MD;  Location: [facility] ENDOSCOPY;  Service: Endoscopy;  Laterality: N/A;  . ESOPHAGOGASTRODUODENOSCOPY (EGD) WITH PROPOFOL N/A 02/03/2018   Procedure: ESOPHAGOGASTRODUODENOSCOPY (EGD);  Surgeon: Irving Copas., MD;  Location: [place];  Service: Gastroenterology;  Laterality: N/A;  . KNEE SURGERY     BILATERAL  . POLYPECTOMY  09/26/2017   Procedure: POLYPECTOMY;  Surgeon: Ladene Artist, MD;  Location: [facility] ENDOSCOPY;  Service: Endoscopy;;  . ROTATOR CUFF REPAIR    . SUBMUCOSAL INJECTION  02/03/2018   Procedure: SUBMUCOSAL INJECTION;  Surgeon: Rush Landmark Telford Nab., MD;  Location: [place];  Service: Gastroenterology;;     reports that he has quit smoking. He has never used smokeless tobacco. He reports that he does not drink alcohol or use drugs.  Allergies  Allergen Reactions  . Benazepril Shortness Of Breath    Cough, wheezing    Family History  Problem Relation Age of Onset  . Depression Sister   . Heart disease Sister   . Heart  disease Mother 45       CAD  . Mental illness Father        Alzheimer's  . Hypertension Other   . Coronary artery disease Other        Male 1st degree relative <50  . Colon cancer Neg Hx   . Stomach cancer Neg Hx   . Esophageal cancer Neg Hx   . Liver disease Neg Hx   . Pancreatic cancer Neg Hx   . Rectal cancer Neg Hx     Prior to Admission medications   Medication Sig Start Date End Date Taking? Authorizing Provider  amLODipine (NORVASC) 5 MG tablet Take 1 tablet (5 mg total) by mouth daily. 01/21/18  Yes Bhagat, Bhavinkumar, PA  aspirin 81 MG EC tablet Take 81 mg by mouth daily.     Yes [provider]  carvedilol (COREG) 25 MG tablet Take 1 tablet (25 mg total) by mouth 2 (two) times daily with a meal. 11/21/18  Yes Plotnikov, Evie Lacks, MD  Cholecalciferol 1000 UNITS tablet Take 2,000 Units by mouth daily.     Yes [provider]  colchicine 0.6 MG tablet Take 1 tablet (0.6 mg total) by mouth 2 (two) times  daily as needed. Patient taking differently: Take 0.6 mg by mouth 2 (two) times daily as needed (for gout.).  12/18/18  Yes Plotnikov, Evie Lacks, MD  Cyanocobalamin (VITAMIN B-12) 1000 MCG SUBL Place 1 tablet (1,000 mcg total) under the tongue daily. 08/17/15  Yes Plotnikov, Evie Lacks, MD  docusate sodium (COLACE) 100 MG capsule Take 1 capsule (100 mg total) by mouth 2 (two) times daily. 07/26/15  Yes Donne Hazel, MD  famotidine (PEPCID) 40 MG tablet Take 1 tablet (40 mg total) by mouth daily. 12/24/18  Yes Plotnikov, Evie Lacks, MD  hydrALAZINE (APRESOLINE) 50 MG tablet Take 1 tablet (50 mg total) by mouth 3 (three) times daily. 09/25/18  Yes Plotnikov, Evie Lacks, MD  irbesartan (AVAPRO) 150 MG tablet TAKE 1 TABLET BY MOUTH  DAILY Patient taking differently: Take 150 mg by mouth daily.  11/03/18  Yes Plotnikov, Evie Lacks, MD  metFORMIN (GLUCOPHAGE-XR) 750 MG 24 hr tablet TAKE 1 TABLET BY MOUTH  DAILY WITH BREAKFAST Patient taking differently: Take 750 mg by mouth  daily with breakfast.  11/04/18  Yes Plotnikov, Evie Lacks, MD  omeprazole (PRILOSEC) 40 MG capsule Take 1 capsule (40 mg total) by mouth daily. 09/25/18  Yes Plotnikov, Evie Lacks, MD  pravastatin (PRAVACHOL) 80 MG tablet Take 1 tablet (80 mg total) by mouth at bedtime. 09/25/18  Yes Plotnikov, Evie Lacks, MD  tadalafil (ADCIRCA/CIALIS) 20 MG tablet Take 20 mg by mouth as needed for erectile dysfunction.   Yes [provider]  torsemide (DEMADEX) 100 MG tablet Take 1 tablet (100 mg total) by mouth daily. 01/08/18  Yes Plotnikov, Evie Lacks, MD  Blood Glucose Monitoring Suppl (ONETOUCH VERIO) w/Device KIT 1 Units by Does not apply route daily as needed. 10/20/18   Plotnikov, Evie Lacks, MD  chlorhexidine (HIBICLENS) 4 % external liquid Apply topically daily as needed. Patient not taking: Reported on 01/10/2019 10/20/18   Plotnikov, Evie Lacks, MD  glucose blood (ONETOUCH VERIO) test strip Use as instructed qd - bid 10/20/18   Plotnikov, Evie Lacks, MD  Lancets [facility] ULTRASOFT) lancets Use as instructed 10/20/18   Plotnikov, Evie Lacks, MD    Physical Exam:  Constitutional: Elderly male in NAD, calm, comfortable Vitals:   01/10/19 0615 01/10/19 0630 01/10/19 0645 01/10/19 0700  BP: (!) 177/91 (!) 177/81 (!) 180/88 (!) 173/83  Pulse: 76 (!) 59 (!) 59 73  Resp: 16 17 18 20   Temp:      TempSrc:      SpO2: 97% 97% 96% 97%   Eyes: PERRL, lids and conjunctivae normal ENMT: Mucous membranes are moist. Posterior pharynx clear of any exudate or lesions.  Neck: normal, supple, no masses, no thyromegaly Respiratory: clear to auscultation bilaterally, no wheezing, no crackles. Normal respiratory effort. No accessory muscle use.  Cardiovascular: Bradycardic, no murmurs / rubs / gallops.  Swelling noted of the left hand and right ankle.  Trace pitting edema noted on right to the mid tibia.  2+ pedal pulses. No carotid bruits.  Abdomen: no tenderness, no masses palpated. No hepatosplenomegaly. Bowel sounds  positive.  Musculoskeletal: Clubbing present.  Mild decreased range of motion of the left thumb.  No significant tenderness to palpation of the right ankle or left hand. Skin: Healed scar from CABG.  No significant erythema warmth noted to be right ankle or left hand. Neurologic: CN 2-12 grossly intact. Sensation intact, DTR normal. Strength 5/5 in all 4.  Psychiatric: Normal judgment and insight. Alert and oriented x 3. Normal mood.  Labs on Admission: I have personally reviewed following labs and imaging studies  CBC: Recent Labs  Lab 01/09/19 1520  WBC 5.5  HGB 12.3*  HCT 38.1*  MCV 84.1  PLT 974   Basic Metabolic Panel: Recent Labs  Lab 01/09/19 1520  NA 144  K 3.9  CL 105  CO2 26  GLUCOSE 106*  BUN 14  CREATININE 1.25*  CALCIUM 9.4   GFR: Estimated Creatinine Clearance: 52.7 mL/min (A) (by C-G formula based on SCr of 1.25 mg/dL (H)). Liver Function Tests: No results for input(s): AST, ALT, ALKPHOS, BILITOT, PROT, ALBUMIN in the last 168 hours. No results for input(s): LIPASE, AMYLASE in the last 168 hours. No results for input(s): AMMONIA in the last 168 hours. Coagulation Profile: No results for input(s): INR, PROTIME in the last 168 hours. Cardiac Enzymes: No results for input(s): CKTOTAL, CKMB, CKMBINDEX, TROPONINI in the last 168 hours. BNP (last 3 results) No results for input(s): PROBNP in the last 8760 hours. HbA1C: No results for input(s): HGBA1C in the last 72 hours. CBG: No results for input(s): GLUCAP in the last 168 hours. Lipid Profile: No results for input(s): CHOL, HDL, LDLCALC, TRIG, CHOLHDL, LDLDIRECT in the last 72 hours. Thyroid Function Tests: No results for input(s): TSH, T4TOTAL, FREET4, T3FREE, THYROIDAB in the last 72 hours. Anemia Panel: No results for input(s): VITAMINB12, FOLATE, FERRITIN, TIBC, IRON, RETICCTPCT in the last 72 hours. Urine analysis:    Component Value Date/Time   COLORURINE YELLOW 10/20/2018 1624    APPEARANCEUR CLEAR 10/20/2018 1624   LABSPEC 1.020 10/20/2018 1624   PHURINE 5.5 10/20/2018 1624   GLUCOSEU 100 (A) 10/20/2018 1624   HGBUR NEGATIVE 10/20/2018 1624   BILIRUBINUR NEGATIVE 10/20/2018 1624   KETONESUR NEGATIVE 10/20/2018 1624   PROTEINUR 100 (A) 08/13/2015 1040   UROBILINOGEN 0.2 10/20/2018 1624   NITRITE NEGATIVE 10/20/2018 1624   LEUKOCYTESUR NEGATIVE 10/20/2018 1624   Sepsis Labs: No results found for this or any previous visit (from the past 240 hour(s)).   Radiological Exams on Admission: Dg Chest 2 View  Result Date: 01/09/2019 CLINICAL DATA:  Congestive heart failure EXAM: CHEST - 2 VIEW COMPARISON:  09/24/2017 FINDINGS: Post CABG changes. The heart size and mediastinal contours are stable. No focal consolidation, pleural effusion, or pneumothorax. IMPRESSION: Stable mild cardiomegaly without acute cardiopulmonary findings. Electronically Signed   By: Davina Poke M.D.   On: 01/09/2019 15:18   Dg Ankle Complete Right  Result Date: 01/10/2019 CLINICAL DATA:  Swelling recent fall EXAM: RIGHT ANKLE - COMPLETE 3+ VIEW COMPARISON:  None. FINDINGS: No fracture or dislocation seen. There is soft tissue swelling seen around the lateral malleolus. Small ankle joint effusion is seen. 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 Dorsal osteophytes seen in the midfoot. Calcaneal enthesophytes are noted. Surgical clips seen around the medial ankle. IMPRESSION: No acute osseous abnormality. Electronically Signed   By: Prudencio Pair M.D.   On: 01/10/2019 02:39   Dg Hand Complete Left  Result Date: 01/10/2019 CLINICAL DATA:  Swelling, recent fall EXAM: LEFT HAND - COMPLETE 3+ VIEW COMPARISON:  None. FINDINGS: There is no evidence of fracture or dislocation. 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 No focal soft tissue swelling. IMPRESSION: No acute osseus injury. Electronically Signed   By: Prudencio Pair M.D.   On: 01/10/2019 02:38    EKG:  Independently reviewed.  Junctional rhythm at 53 bpm with T wave abnormality concerning for lateral ischemia  Assessment/Plan Hypertensive urgency: Acute.  Patient's initial  blood pressures elevated up to 225/91.  Patient was given hydralazine IV.  Home blood pressure medications include amlodipine, Coreg, hydralazine, ibesartan, and torsemide.  Amlodipine, irbesartan, and hydralazine have room for up titration of the dose.  Would likely hold off increasing Coreg dose due to bradycardia. -Admit to a medical telemetry bed -Increase amlodipine from 5 mg to 10 mg -Continue all other home medications at current dose -May warrant adjustment to other medication doses  Elevated troponin, abnormal EKG, history of CAD s/p CABG: Acute.  High-sensitivity troponin 18->19.  Patient denies any chest pain symptoms.  EKG with new T wave inversions concerning for lateral ischemia.  Chest x-ray noting cardiomegaly without signs of acute edema.  Patient follows in outpatient setting with Dr. Burt Knack of cardiology.  Suspect symptoms possibly related with uncontrolled hypertension vs. underlying ischemia. -Check echocardiogram -If echocardiogram shows significant decrease in function will formally consult cardiology at that time  Peripheral edema: Acute.  Patient complaining of swelling of the right ankle and left hand swelling approximately 1 week.  Denies any recent injuries.  X-ray significant for osteoarthritis thyroid studies previously noted to be within normal limits in July.  Question of symptoms secondary to osteoarthritis vs. CHF vs. gout. -Check uric acid, ESR, CRP  Diastolic CHF: Patient does not report any significant change in weight.  Last EF noted to be 60 to 65% with grade 1 diastolic dysfunction and pulmonary hypertension in 01/2018.  BNP mildly elevated at 109.5, but appears lower than previous checks. -Daily weights -Monitor intake and output   Chronic dyspnea/COPD: Patient with remote history of  smoking.  Notes having shortness of breath with exertion that feels more so chronic. -Albuterol nebs as needed  Normocytic anemia: Chronic.  Hemoglobin appears stable at 12.3. -Continue to monitor  Diabetes mellitus type 2: Last hemoglobin A1c 5.9 in 10/2018.  Patient on oral medications of metformin -Hypoglycemic protocols -Hold metformin -CBGs q. before meals with sensitive SSI  Hyperlipidemia -Continue pravastatin  Chronic kidney disease stage II/III: Creatinine 1.25 which appears near patient's baseline. -Continue to monitor  Elevated PSA:  PSA elevated at 7.66 on 10/20/2018. -Will need to be set up with outpatient urology follow-up   DVT prophylaxis:lovenox Code Status: Full Family Communication: No family present at bedside Disposition Plan: Likely discharge home in a.m. Consults called: none Admission status: Inpatient  Norval Morton MD [facility] Pager [phone]   If 7PM-7AM, please contact night-coverage [email or web address] Password [email or web address]  01/10/2019, 7:14 AM

## 2019-01-10 NOTE — ED Notes (Addendum)
ED TO INPATIENT HANDOFF REPORT  ED Nurse Name and Phone #: Thurmond Butts Winner Name/Age/Gender Harold Pittman 78 y.o. male Room/Bed: 009C/009C  Code Status   Code Status: Full Code  Home/SNF/Other Home Patient oriented to: self, place, time and situation Is this baseline? Yes   Triage Complete: Triage complete  Chief Complaint CHF,left arm swelling,hbp  Triage Note Pt to ER states sent from cardiology for CHF and swelling. Also noted to be hypertensive. States he is not short of breath. Does have lower extremity edema.    Allergies Allergies  Allergen Reactions  . Benazepril Shortness Of Breath    Cough, wheezing    Level of Care/Admitting Diagnosis ED Disposition    ED Disposition Condition [facility] Area: [employer] [[id number]]  Level of Care: Telemetry Medical [104]  Covid Evaluation: Asymptomatic Screening Protocol (No Symptoms)  Diagnosis: Hypertensive urgency [540981]  Admitting Physician: Norval Morton [[id number]]  Attending Physician: Norval Morton [[id number]]  Estimated length of stay: past midnight tomorrow  Certification:: I certify this patient will need inpatient services for at least 2 midnights  PT Class (Do Not Modify): Inpatient [101]  PT Acc Code (Do Not Modify): Private [1]       B Medical/Surgery History Past Medical History:  Diagnosis Date  . Anemia    low iron  . Anxiety state, unspecified   . Blood in stool   . Chronic airway obstruction, not elsewhere classified   . Coronary artery disease   . Coronary atherosclerosis of artery bypass graft   . Dyspnea   . ED (erectile dysfunction)   . Esophageal reflux   . Gout, unspecified   . Osteoarthrosis, unspecified whether generalized or localized, unspecified site   . Other and unspecified hyperlipidemia   . Other specified disorder of rectum and anus   . Personal history of tobacco use, presenting hazards to health   . Sleep apnea    does not use  cpap  . Type II or unspecified type diabetes mellitus without mention of complication, not stated as uncontrolled   . Unspecified essential hypertension    Past Surgical History:  Procedure Laterality Date  . BIOPSY  09/26/2017   Procedure: BIOPSY;  Surgeon: Ladene Artist, MD;  Location: [facility] END[facility]COPY;  Service: Endoscopy;;  . BIOPSY  02/03/2018   Procedure: BIOPSY;  Surgeon: Irving Copas., MD;  Location: [place];  Service: Gastroenterology;;  . COLONOSCOPY WITH PROPOFOL N/A 09/26/2017   Procedure: COLONOSCOPY WITH PROPOFOL;  Surgeon: Ladene Artist, MD;  Location[facility] END[facility]COPY;  Service: Endoscopy;  Laterality: N/A;  . CORONARY ARTERY BYPASS GRAFT  97   LIMA to LAD, sequential saphenous vein graft to the first and second diagonal, sequential saphenous vein graft to the intermediate OM and circumflex and SVG to RCA  . ESOPHAGOGASTRODUODENOSCOPY (EGD) WITH PROPOFOL N/A 09/26/2017   Procedure: ESOPHAGOGASTRODUODENOSCOPY (EGD) WITH PROPOFOL;  Surgeon: Ladene Artist, MD;  Location: [facility] ENDOSCOPY;  Service: Endoscopy;  Laterality: N/A;  . ESOPHAGOGASTRODUODENOSCOPY (EGD) WITH PROPOFOL N/A 02/03/2018   Procedure: ESOPHAGOGASTRODUODENOSCOPY (EGD);  Surgeon: Irving Copas., MD;  Location: [place];  Service: Gastroenterology;  Laterality: N/A;  . KNEE SURGERY     BILATERAL  . POLYPECTOMY  09/26/2017   Procedure: POLYPECTOMY;  Surgeon: Ladene Artist, MD;  Location: [facility] ENDOSCOPY;  Service: Endoscopy;;  . ROTATOR CUFF REPAIR    . SUBMUCOSAL INJECTION  02/03/2018   Procedure: SUBMUCOSAL INJECTION;  Surgeon: Irving Copas.,  MD;  Location: [place];  Service: Gastroenterology;;     A IV Location/Drains/Wounds Patient Lines/Drains/Airways Status   Active Line/Drains/Airways    Name:   Placement date:   Placement time:   Site:   Days:   Peripheral IV 01/10/19 Right Antecubital   01/10/19    0236    Antecubital   less than 1          Intake/Output  Last 24 hours No intake or output data in the 24 hours ending 01/10/19 0955  Labs/Imaging Results for orders placed or performed during the hospital encounter of 01/09/19 (from the past 48 hour(s))  Basic metabolic panel     Status: Abnormal   Collection Time: 01/09/19  3:20 PM  Result Value Ref Range   Sodium 144 135 - 145 mmol/L   Potassium 3.9 3.5 - 5.1 mmol/L   Chloride 105 98 - 111 mmol/L   CO2 26 22 - 32 mmol/L   Glucose, Bld 106 (H) 70 - 99 mg/dL   BUN 14 8 - 23 mg/dL   Creatinine, Ser 1.25 (H) 0.61 - 1.24 mg/dL   Calcium 9.4 8.9 - 10.3 mg/dL   GFR calc non Af Amer 55 (L) >60 mL/min   GFR calc Af Amer >60 >60 mL/min   Anion gap 13 5 - 15    Comment: Performed at [facility], [place] [address], [place], [place] [address]  CBC     Status: Abnormal   Collection Time: 01/09/19  3:20 PM  Result Value Ref Range   WBC 5.5 4.0 - 10.5 K/uL   RBC 4.53 4.22 - 5.81 MIL/uL   Hemoglobin 12.3 (L) 13.0 - 17.0 g/dL   HCT 38.1 (L) 39.0 - 52.0 %   MCV 84.1 80.0 - 100.0 fL   MCH 27.2 26.0 - 34.0 pg   MCHC 32.3 30.0 - 36.0 g/dL   RDW 14.6 11.5 - 15.5 %   Platelets 203 150 - 400 K/uL   nRBC 0.0 0.0 - 0.2 %    Comment: Performed at [facility], [place] [address], [place], [place] [address]  Troponin I (High Sensitivity)     Status: Abnormal   Collection Time: 01/10/19  2:16 AM  Result Value Ref Range   Troponin I (High Sensitivity) 18 (H) <18 ng/L    Comment: (NOTE) Elevated high sensitivity troponin I (hsTnI) values and significant  changes across serial measurements may suggest ACS but many other  chronic and acute conditions are known to elevate hsTnI results.  Refer to the "Links" section for chest pain algorithms and additional  guidance. Performed at [facility], [place] [address], [place], [place] [address]   Brain natriuretic peptide     Status: Abnormal   Collection Time: 01/10/19  2:16 AM  Result Value Ref Range   B Natriuretic Peptide 109.5 (H) 0.0 - 100.0  pg/mL    Comment: Performed at [place] [address], [place], [place] [address]  Troponin I (High Sensitivity)     Status: Abnormal   Collection Time: 01/10/19  3:58 AM  Result Value Ref Range   Troponin I (High Sensitivity) 19 (H) <18 ng/L    Comment: (NOTE) Elevated high sensitivity troponin I (hsTnI) values and significant  changes across serial measurements may suggest ACS but many other  chronic and acute conditions are known to elevate hsTnI results.  Refer to the "Links" section for chest pain algorithms and additional  guidance. Performed at  [facility], [place] [address], [place], [place] [address]   SARS CORONAVIRUS 2 (TAT 6-24 HRS) Nasopharyngeal Nasopharyngeal Swab     Status: None   Collection Time: 01/10/19  4:30 AM   Specimen: Nasopharyngeal Swab  Result Value Ref Range   SARS Coronavirus 2 NEGATIVE NEGATIVE    Comment: (NOTE) SARS-CoV-2 target nucleic acids are NOT DETECTED. The SARS-CoV-2 RNA is generally detectable in upper and lower respiratory specimens during the acute phase of infection. Negative results do not preclude SARS-CoV-2 infection, do not rule out co-infections with other pathogens, and should not be used as the sole basis for treatment or other patient management decisions. Negative results must be combined with clinical observations, patient history, and epidemiological information. The expected result is Negative. Fact Sheet for Patients: SugarRoll.be Fact Sheet for Healthcare Providers: https://www.woods-mathews.com/ This test is not yet approved or cleared by the [place] FDA and  has been authorized for detection and/or diagnosis of SARS-CoV-2 by FDA under an Emergency Use Authorization (EUA). This EUA will remain  in effect (meaning this test can be used) for the duration of the COVID-19 declaration under Section 56 4(b)(1) of the Act, 21 U.S.C. section 360bbb-3(b)(1), unless  the authorization is terminated or revoked sooner. Performed at [facility], [place] [address] [place], [place] [address]   TSH     Status: None   Collection Time: 01/10/19  8:27 AM  Result Value Ref Range   TSH 0.579 0.350 - 4.500 uIU/mL    Comment: Performed by a 3rd Generation assay with a functional sensitivity of <=0.01 uIU/mL. Performed at [facility], [place] [address], [place], [place] [address]   Comprehensive metabolic panel     Status: Abnormal   Collection Time: 01/10/19  8:27 AM  Result Value Ref Range   Sodium 142 135 - 145 mmol/L   Potassium 3.2 (L) 3.5 - 5.1 mmol/L   Chloride 104 98 - 111 mmol/L   CO2 25 22 - 32 mmol/L   Glucose, Bld 172 (H) 70 - 99 mg/dL   BUN 14 8 - 23 mg/dL   Creatinine, Ser 1.22 0.61 - 1.24 mg/dL   Calcium 9.3 8.9 - 10.3 mg/dL   Total Protein 6.9 6.5 - 8.1 g/dL   Albumin 4.1 3.5 - 5.0 g/dL   AST 24 15 - 41 U/L   ALT 18 0 - 44 U/L   Alkaline Phosphatase 69 38 - 126 U/L   Total Bilirubin 0.8 0.3 - 1.2 mg/dL   GFR calc non Af Amer 57 (L) >60 mL/min   GFR calc Af Amer >60 >60 mL/min   Anion gap 13 5 - 15    Comment: Performed at [facility], [place] [address] [place], [place] [address]  Sedimentation rate     Status: None   Collection Time: 01/10/19  8:27 AM  Result Value Ref Range   Sed Rate 11 0 - 16 mm/hr    Comment: Performed at [place] [address] [place], [place] [address]  C-reactive protein     Status: None   Collection Time: 01/10/19  8:27 AM  Result Value Ref Range   CRP <0.8 <1.0 mg/dL    Comment: Performed at [place] [address], [place], [place] [address]  Uric acid     Status: Abnormal   Collection Time: 01/10/19  8:27 AM  Result Value Ref Range   Uric Acid, Serum 8.7 (H) 3.7 -  8.6 mg/dL    Comment: Performed at [facility], [place] [address], [place], [place] [address]  CBG monitoring, ED     Status: Abnormal   Collection Time: 01/10/19  8:39 AM  Result Value Ref Range    Glucose-Capillary 208 (H) 70 - 99 mg/dL   Dg Chest 2 View  Result Date: 01/09/2019 CLINICAL DATA:  Congestive heart failure EXAM: CHEST - 2 VIEW COMPARISON:  09/24/2017 FINDINGS: Post CABG changes. The heart size and mediastinal contours are stable. No focal consolidation, pleural effusion, or pneumothorax. IMPRESSION: Stable mild cardiomegaly without acute cardiopulmonary findings. Electronically Signed   By: Davina Poke M.D.   On: 01/09/2019 15:18   Dg Ankle Complete Right  Result Date: 01/10/2019 CLINICAL DATA:  Swelling recent fall EXAM: RIGHT ANKLE - COMPLETE 3+ VIEW COMPARISON:  None. FINDINGS: No fracture or dislocation seen. There is soft tissue swelling seen around the lateral malleolus. Small ankle joint effusion is seen. 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 Dorsal osteophytes seen in the midfoot. Calcaneal enthesophytes are noted. Surgical clips seen around the medial ankle. IMPRESSION: No acute osseous abnormality. Electronically Signed   By: Prudencio Pair M.D.   On: 01/10/2019 02:39   Dg Hand Complete Left  Result Date: 01/10/2019 CLINICAL DATA:  Swelling, recent fall EXAM: LEFT HAND - COMPLETE 3+ VIEW COMPARISON:  None. FINDINGS: There is no evidence of fracture or dislocation. 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 No focal soft tissue swelling. IMPRESSION: No acute osseus injury. Electronically Signed   By: Prudencio Pair M.D.   On: 01/10/2019 02:38    Pending Labs Unresulted Labs (From admission, onward)    Start     Ordered   01/11/19 3154  Basic metabolic panel  Tomorrow morning,   R     01/10/19 0730   01/11/19 0500  CBC  Tomorrow morning,   R     01/10/19 0730          Vitals/Pain Today's Vitals   01/10/19 0615 01/10/19 0630 01/10/19 0645 01/10/19 0700  BP: (!) 177/91 (!) 177/81 (!) 180/88 (!) 173/83  Pulse: 76 (!) 59 (!) 59 73  Resp: 16 17 18 20   Temp:      TempSrc:      SpO2: 97% 97% 96% 97%  PainSc:         Isolation Precautions No active isolations  Medications Medications  aspirin EC tablet 81 mg (has no administration in time range)  colchicine tablet 0.6 mg (has no administration in time range)  hydrALAZINE (APRESOLINE) tablet 50 mg (has no administration in time range)  torsemide (DEMADEX) tablet 100 mg (has no administration in time range)  pravastatin (PRAVACHOL) tablet 80 mg (has no administration in time range)  irbesartan (AVAPRO) tablet 150 mg (has no administration in time range)  carvedilol (COREG) tablet 25 mg (25 mg Oral Given 01/10/19 0847)  famotidine (PEPCID) tablet 40 mg (has no administration in time range)  enoxaparin (LOVENOX) injection 40 mg (has no administration in time range)  acetaminophen (TYLENOL) tablet 650 mg (has no administration in time range)    Or  acetaminophen (TYLENOL) suppository 650 mg (has no administration in time range)  ondansetron (ZOFRAN) tablet 4 mg (has no administration in time range)    Or  ondansetron (ZOFRAN) injection 4 mg (has no administration in time range)  albuterol (PROVENTIL) (2.5 MG/3ML) 0.083% nebulizer solution 2.5 mg (has no administration in time range)  insulin aspart (novoLOG) injection 0-9 Units (  3 Units Subcutaneous Given 01/10/19 0848)  amLODipine (NORVASC) tablet 10 mg (has no administration in time range)  hydrALAZINE (APRESOLINE) injection 20 mg (20 mg Intravenous Given 01/10/19 0235)    Mobility walks Low fall risk   Focused Assessments    R Recommendations: See Admitting Provider Note  Report given to: [facility] RN  Additional Notes:

## 2019-01-10 NOTE — Progress Notes (Signed)
Patient arrived to 5C09 from ED. Safety precautions and orders reviewed with patient. TELE applied and confirmed. Patient denied pain at this time. No other distress or concerns voice. Will continue to monitor.  Ave Filter, RN

## 2019-01-10 NOTE — ED Provider Notes (Signed)
[facility] EMERGENCY DEPARTMENT Provider Note   CSN: [id number] Arrival date & time: 01/09/19  1429     History   Chief Complaint Chief Complaint  Patient presents with  . Leg Swelling    HPI Harold Pittman is a 78 y.o. male.     Patient was referred to the emergency department by his primary care doctor.  He visited his primary care doctor earlier today because he has been experiencing swelling of his left hand and his right ankle.  He is not sure exactly when this swelling started.  He does not think there was any injuries, however he has had several falls recently.  He reports that he has not fallen in the last 2 weeks, however.  While at his doctor's office he was noted to have very elevated blood pressure.  Patient is not experiencing chest pain, shortness of breath, headache, blurred vision.  He has not had any changes in his medications or run out of any of his medications.     Past Medical History:  Diagnosis Date  . Anemia    low iron  . Anxiety state, unspecified   . Blood in stool   . Chronic airway obstruction, not elsewhere classified   . Coronary artery disease   . Coronary atherosclerosis of artery bypass graft   . Dyspnea   . ED (erectile dysfunction)   . Esophageal reflux   . Gout, unspecified   . Osteoarthrosis, unspecified whether generalized or localized, unspecified site   . Other and unspecified hyperlipidemia   . Other specified disorder of rectum and anus   . Personal history of tobacco use, presenting hazards to health   . Sleep apnea    does not use cpap  . Type II or unspecified type diabetes mellitus without mention of complication, not stated as uncontrolled   . Unspecified essential hypertension     Patient Active Problem List   Diagnosis Date Noted  . Well adult exam 10/20/2018  . Balanitis 10/20/2018  . 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/27/2017  . Adenoma of stomach 12/25/2017  . Abnormal  findings on esophagogastroduodenoscopy (EGD) 12/25/2017  . Sebaceous cyst 10/04/2017  . Iron deficiency anemia   . Benign neoplasm of sigmoid colon   . Benign neoplasm of cecum   . Benign neoplasm of transverse colon   . Positive occult stool blood test 09/24/2017  . CKD (chronic kidney disease), stage III 09/24/2017  . Chronic diastolic CHF (congestive heart failure) ([place]) 09/24/2017  . Generalized weakness 07/22/2015  . Gait disorder 03/18/2014  . Low back pain 01/12/2014  . History of colon polyps 12/17/2013  . DM (diabetes mellitus), type 2 with peripheral vascular complications ([place]) 38/25/0539  . Diarrhea 10/08/2012  . Edema 10/08/2011  . DOE (dyspnea on exertion) 06/29/2011  . LATERAL EPICONDYLITIS, LEFT 02/20/2010  . TOBACCO USE, QUIT 02/23/2009  . Cough 02/16/2009  . DECREASED HEARING 10/15/2008  . ERECTILE DYSFUNCTION 04/30/2008  . CAD (coronary artery disease) 04/30/2008  . COPD (chronic obstructive pulmonary disease) ([place]) 05/21/2007  . Anxiety state 05/20/2007  . INSOMNIA, PERSISTENT 03/04/2007  . Dyslipidemia 02/01/2007  . Gout 02/01/2007  . Essential hypertension 02/01/2007  . GERD 02/01/2007  . Osteoarthritis 02/01/2007    Past Surgical History:  Procedure Laterality Date  . BIOPSY  09/26/2017   Procedure: BIOPSY;  Surgeon: Ladene Artist, MD;  Location: [facility] ENDOSCOPY;  Service: Endoscopy;;  . BIOPSY  02/03/2018   Procedure: BIOPSY;  Surgeon: Justice Britain  Brooke Bonito., MD;  Location: [place];  Service: Gastroenterology;;  . COLONOSCOPY WITH PROPOFOL N/A 09/26/2017   Procedure: COLONOSCOPY WITH PROPOFOL;  Surgeon: Ladene Artist, MD;  Location: [facility] ENDOSCOPY;  Service: Endoscopy;  Laterality: N/A;  . CORONARY ARTERY BYPASS GRAFT  97   LIMA to LAD, sequential saphenous vein graft to the first and second diagonal, sequential saphenous vein graft to the intermediate OM and circumflex and SVG to RCA  . ESOPHAGOGASTRODUODENOSCOPY (EGD) WITH PROPOFOL N/A  09/26/2017   Procedure: ESOPHAGOGASTRODUODENOSCOPY (EGD) WITH PROPOFOL;  Surgeon: Ladene Artist, MD;  Location: [facility] ENDOSCOPY;  Service: Endoscopy;  Laterality: N/A;  . ESOPHAGOGASTRODUODENOSCOPY (EGD) WITH PROPOFOL N/A 02/03/2018   Procedure: ESOPHAGOGASTRODUODENOSCOPY (EGD);  Surgeon: Irving Copas., MD;  Location: [place];  Service: Gastroenterology;  Laterality: N/A;  . KNEE SURGERY     BILATERAL  . POLYPECTOMY  09/26/2017   Procedure: POLYPECTOMY;  Surgeon: Ladene Artist, MD;  Location: [facility] ENDOSCOPY;  Service: Endoscopy;;  . ROTATOR CUFF REPAIR    . SUBMUCOSAL INJECTION  02/03/2018   Procedure: SUBMUCOSAL INJECTION;  Surgeon: Rush Landmark Telford Nab., MD;  Location: [place];  Service: Gastroenterology;;        Home Medications    Prior to Admission medications   Medication Sig Start Date End Date Taking? Authorizing Provider  amLODipine (NORVASC) 5 MG tablet Take 1 tablet (5 mg total) by mouth daily. 01/21/18  Yes Bhagat, Bhavinkumar, PA  aspirin 81 MG EC tablet Take 81 mg by mouth daily.     Yes [provider]  carvedilol (COREG) 25 MG tablet Take 1 tablet (25 mg total) by mouth 2 (two) times daily with a meal. 11/21/18  Yes Plotnikov, Evie Lacks, MD  Cholecalciferol 1000 UNITS tablet Take 2,000 Units by mouth daily.     Yes [provider]  colchicine 0.6 MG tablet Take 1 tablet (0.6 mg total) by mouth 2 (two) times daily as needed. Patient taking differently: Take 0.6 mg by mouth 2 (two) times daily as needed (for gout.).  12/18/18  Yes Plotnikov, Evie Lacks, MD  Cyanocobalamin (VITAMIN B-12) 1000 MCG SUBL Place 1 tablet (1,000 mcg total) under the tongue daily. 08/17/15  Yes Plotnikov, Evie Lacks, MD  docusate sodium (COLACE) 100 MG capsule Take 1 capsule (100 mg total) by mouth 2 (two) times daily. 07/26/15  Yes Donne Hazel, MD  famotidine (PEPCID) 40 MG tablet Take 1 tablet (40 mg total) by mouth daily. 12/24/18  Yes Plotnikov, Evie Lacks, MD   hydrALAZINE (APRESOLINE) 50 MG tablet Take 1 tablet (50 mg total) by mouth 3 (three) times daily. 09/25/18  Yes Plotnikov, Evie Lacks, MD  irbesartan (AVAPRO) 150 MG tablet TAKE 1 TABLET BY MOUTH  DAILY Patient taking differently: Take 150 mg by mouth daily.  11/03/18  Yes Plotnikov, Evie Lacks, MD  metFORMIN (GLUCOPHAGE-XR) 750 MG 24 hr tablet TAKE 1 TABLET BY MOUTH  DAILY WITH BREAKFAST Patient taking differently: Take 750 mg by mouth daily with breakfast.  11/04/18  Yes Plotnikov, Evie Lacks, MD  omeprazole (PRILOSEC) 40 MG capsule Take 1 capsule (40 mg total) by mouth daily. 09/25/18  Yes Plotnikov, Evie Lacks, MD  pravastatin (PRAVACHOL) 80 MG tablet Take 1 tablet (80 mg total) by mouth at bedtime. 09/25/18  Yes Plotnikov, Evie Lacks, MD  tadalafil (ADCIRCA/CIALIS) 20 MG tablet Take 20 mg by mouth as needed for erectile dysfunction.   Yes [provider]  torsemide (DEMADEX) 100 MG tablet Take 1 tablet (100 mg total) by  mouth daily. 01/08/18  Yes Plotnikov, Evie Lacks, MD  Blood Glucose Monitoring Suppl (ONETOUCH VERIO) w/Device KIT 1 Units by Does not apply route daily as needed. 10/20/18   Plotnikov, Evie Lacks, MD  chlorhexidine (HIBICLENS) 4 % external liquid Apply topically daily as needed. Patient not taking: Reported on 01/10/2019 10/20/18   Plotnikov, Evie Lacks, MD  glucose blood (ONETOUCH VERIO) test strip Use as instructed qd - bid 10/20/18   Plotnikov, Evie Lacks, MD  Lancets [facility] ULTRASOFT) lancets Use as instructed 10/20/18   Plotnikov, Evie Lacks, MD    Family History Family History  Problem Relation Age of Onset  . Depression Sister   . Heart disease Sister   . Heart disease Mother 21       CAD  . Mental illness Father        Alzheimer's  . Hypertension Other   . Coronary artery disease Other        Male 1st degree relative <50  . Colon cancer Neg Hx   . Stomach cancer Neg Hx   . Esophageal cancer Neg Hx   . Liver disease Neg Hx   . Pancreatic cancer Neg Hx   . Rectal  cancer Neg Hx     Social History Social History   Tobacco Use  . Smoking status: Former Research scientist (life sciences)  . Smokeless tobacco: Never Used  . Tobacco comment: Stopped 1997  Substance Use Topics  . Alcohol use: No    Comment: Stopped 1997  . Drug use: No     Allergies   Benazepril   Review of Systems Review of Systems  Respiratory: Negative for shortness of breath.   Cardiovascular: Negative for chest pain.  Musculoskeletal: Positive for arthralgias.  All other systems reviewed and are negative.    Physical Exam Updated Vital Signs BP (!) 202/76   Pulse (!) 54   Temp 98 F (36.7 C) (Oral)   Resp (!) 23   SpO2 98%   Physical Exam Vitals signs and nursing note reviewed.  Constitutional:      General: He is not in acute distress.    Appearance: Normal appearance. He is well-developed.  HENT:     Head: Normocephalic and atraumatic.     Right Ear: Hearing normal.     Left Ear: Hearing normal.     Nose: Nose normal.  Eyes:     Conjunctiva/sclera: Conjunctivae normal.     Pupils: Pupils are equal, round, and reactive to light.  Neck:     Musculoskeletal: Normal range of motion and neck supple.  Cardiovascular:     Rate and Rhythm: Regular rhythm.     Heart sounds: S1 normal and S2 normal. No murmur. No friction rub. No gallop.   Pulmonary:     Effort: Pulmonary effort is normal. No respiratory distress.     Breath sounds: Normal breath sounds.  Chest:     Chest wall: No tenderness.  Abdominal:     General: Bowel sounds are normal.     Palpations: Abdomen is soft.     Tenderness: There is no abdominal tenderness. There is no guarding or rebound. Negative signs include Murphy's sign and McBurney's sign.     Hernia: No hernia is present.  Musculoskeletal: Normal range of motion.     Right ankle: He exhibits swelling (lateral malleolus).     Left hand: He exhibits swelling (MCP area).  Skin:    General: Skin is warm and dry.     Findings: No rash.  Neurological:      Mental Status: He is alert and oriented to person, place, and time.     GCS: GCS eye subscore is 4. GCS verbal subscore is 5. GCS motor subscore is 6.     Cranial Nerves: No cranial nerve deficit.     Sensory: No sensory deficit.     Coordination: Coordination normal.  Psychiatric:        Speech: Speech normal.        Behavior: Behavior normal.        Thought Content: Thought content normal.      ED Treatments / Results  Labs (all labs ordered are listed, but only abnormal results are displayed) Labs Reviewed  BASIC METABOLIC PANEL - Abnormal; Notable for the following components:      Result Value   Glucose, Bld 106 (*)    Creatinine, Ser 1.25 (*)    GFR calc non Af Amer 55 (*)    All other components within normal limits  CBC - Abnormal; Notable for the following components:   Hemoglobin 12.3 (*)    HCT 38.1 (*)    All other components within normal limits  BRAIN NATRIURETIC PEPTIDE - Abnormal; Notable for the following components:   B Natriuretic Peptide 109.5 (*)    All other components within normal limits  TROPONIN I (HIGH SENSITIVITY) - Abnormal; Notable for the following components:   Troponin I (High Sensitivity) 18 (*)    All other components within normal limits  TROPONIN I (HIGH SENSITIVITY)    EKG EKG Interpretation  Date/Time:  Friday January 09 2019 14:46:49 EDT Ventricular Rate:  53 PR Interval:    QRS Duration: 96 QT Interval:  464 QTC Calculation: 435 R Axis:   39 Text Interpretation:  Junctional rhythm T wave abnormality, consider lateral ischemia , new since prior Abnormal ECG Confirmed by Orpah Greek [phone]) on 01/10/2019 1:35:57 AM   Radiology Dg Chest 2 View  Result Date: 01/09/2019 CLINICAL DATA:  Congestive heart failure EXAM: CHEST - 2 VIEW COMPARISON:  09/24/2017 FINDINGS: Post CABG changes. The heart size and mediastinal contours are stable. No focal consolidation, pleural effusion, or pneumothorax. IMPRESSION: Stable mild  cardiomegaly without acute cardiopulmonary findings. Electronically Signed   By: Davina Poke M.D.   On: 01/09/2019 15:18   Dg Ankle Complete Right  Result Date: 01/10/2019 CLINICAL DATA:  Swelling recent fall EXAM: RIGHT ANKLE - COMPLETE 3+ VIEW COMPARISON:  None. FINDINGS: No fracture or dislocation seen. There is soft tissue swelling seen around the lateral malleolus. Small ankle joint effusion is seen. 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 Dorsal osteophytes seen in the midfoot. Calcaneal enthesophytes are noted. Surgical clips seen around the medial ankle. IMPRESSION: No acute osseous abnormality. Electronically Signed   By: Prudencio Pair M.D.   On: 01/10/2019 02:39   Dg Hand Complete Left  Result Date: 01/10/2019 CLINICAL DATA:  Swelling, recent fall EXAM: LEFT HAND - COMPLETE 3+ VIEW COMPARISON:  None. FINDINGS: There is no evidence of fracture or dislocation. 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 No focal soft tissue swelling. IMPRESSION: No acute osseus injury. Electronically Signed   By: Prudencio Pair M.D.   On: 01/10/2019 02:38    Procedures Procedures (including critical care time)  Medications Ordered in ED Medications  hydrALAZINE (APRESOLINE) injection 20 mg (20 mg Intravenous Given 01/10/19 0235)     Initial Impression / Assessment and Plan / ED Course  I have reviewed the triage vital signs  and the nursing notes.  Pertinent labs & imaging results that were available during my care of the patient were reviewed by me and considered in my medical decision making (see chart for details).        Patient presents to the emergency department for evaluation of elevated blood pressure.  Patient went to his primary care doctor office earlier today with complaints of pain and swelling of the left hand and right ankle.  He has had some falls recently but did not think he directly injured these areas.  He has not had a fall in 2 weeks.  He has  had gout.  Patient not experiencing much pain in the areas of the swelling.  No overlying skin changes to suggest infection.  No concern for septic joints.  Patient is hypertensive here in the ER once again.  He does have a history of hypertension, normally treated with Coreg and Norvasc.  Patient is bradycardic, therefore given hydralazine to help with blood pressure.  He has only had a very slight improvement, however.  Patient EKG does show new T wave inversions in lateral leads.  He has not experiencing chest pain, but does report that he has noticed exertional dyspnea causing him to have to sit down and rest.  He does have a borderline elevated troponin of 18.  Based on this and continued hypertension, will require hospitalization for further management.  Final Clinical Impressions(s) / ED Diagnoses   Final diagnoses:  Hypertensive urgency    ED Discharge Orders    None       Korene Dula, Gwenyth Allegra, MD 01/10/19 0403

## 2019-01-11 ENCOUNTER — Encounter (HOSPITAL_COMMUNITY): Payer: Self-pay | Admitting: *Deleted

## 2019-01-11 ENCOUNTER — Inpatient Hospital Stay (HOSPITAL_COMMUNITY): Payer: Medicare Other

## 2019-01-11 DIAGNOSIS — I160 Hypertensive urgency: Secondary | ICD-10-CM

## 2019-01-11 LAB — BASIC METABOLIC PANEL
Anion gap: 13 (ref 5–15)
BUN: 16 mg/dL (ref 8–23)
CO2: 30 mmol/L (ref 22–32)
Calcium: 9.3 mg/dL (ref 8.9–10.3)
Chloride: 99 mmol/L (ref 98–111)
Creatinine, Ser: 1.41 mg/dL — ABNORMAL HIGH (ref 0.61–1.24)
GFR calc Af Amer: 55 mL/min — ABNORMAL LOW (ref >60)
GFR calc non Af Amer: 48 mL/min — ABNORMAL LOW (ref >60)
Glucose, Bld: 143 mg/dL — ABNORMAL HIGH (ref 70–99)
Potassium: 2.9 mmol/L — ABNORMAL LOW (ref 3.5–5.1)
Sodium: 142 mmol/L (ref 135–145)

## 2019-01-11 LAB — CBC
HCT: 36.9 % — ABNORMAL LOW (ref 39.0–52.0)
Hemoglobin: 12.5 g/dL — ABNORMAL LOW (ref 13.0–17.0)
MCH: 27.6 pg (ref 26.0–34.0)
MCHC: 33.9 g/dL (ref 30.0–36.0)
MCV: 81.5 fL (ref 80.0–100.0)
Platelets: 194 10*3/uL (ref 150–400)
RBC: 4.53 MIL/uL (ref 4.22–5.81)
RDW: 14.6 % (ref 11.5–15.5)
WBC: 5.2 10*3/uL (ref 4.0–10.5)
nRBC: 0 % (ref 0.0–0.2)

## 2019-01-11 LAB — GLUCOSE, CAPILLARY
Glucose-Capillary: 116 mg/dL — ABNORMAL HIGH (ref 70–99)
Glucose-Capillary: 133 mg/dL — ABNORMAL HIGH (ref 70–99)
Glucose-Capillary: 138 mg/dL — ABNORMAL HIGH (ref 70–99)
Glucose-Capillary: 181 mg/dL — ABNORMAL HIGH (ref 70–99)

## 2019-01-11 LAB — RENAL FUNCTION PANEL
Albumin: 4.1 g/dL (ref 3.5–5.0)
Anion gap: 11 (ref 5–15)
BUN: 19 mg/dL (ref 8–23)
CO2: 30 mmol/L (ref 22–32)
Calcium: 9.8 mg/dL (ref 8.9–10.3)
Chloride: 101 mmol/L (ref 98–111)
Creatinine, Ser: 1.83 mg/dL — ABNORMAL HIGH (ref 0.61–1.24)
GFR calc Af Amer: 40 mL/min — ABNORMAL LOW (ref >60)
GFR calc non Af Amer: 35 mL/min — ABNORMAL LOW (ref >60)
Glucose, Bld: 189 mg/dL — ABNORMAL HIGH (ref 70–99)
Phosphorus: 4.5 mg/dL (ref 2.5–4.6)
Potassium: 3.7 mmol/L (ref 3.5–5.1)
Sodium: 142 mmol/L (ref 135–145)

## 2019-01-11 LAB — ECHOCARDIOGRAM LIMITED
Height: 71 in
Weight: 2952.4 oz

## 2019-01-11 MED ORDER — POTASSIUM CHLORIDE CRYS ER 20 MEQ PO TBCR
40.0000 meq | EXTENDED_RELEASE_TABLET | ORAL | Status: AC
  Administered 2019-01-11 (×2): 40 meq via ORAL
  Filled 2019-01-11 (×2): qty 2

## 2019-01-11 NOTE — Progress Notes (Signed)
Report given to [place] on 2W. All belongings sent with patient. Family aware of new room 2W09.  Ave Filter, RN

## 2019-01-11 NOTE — Progress Notes (Signed)
  Echocardiogram 2D Echocardiogram has been performed.  Harold Pittman 01/11/2019, 8:40 AM

## 2019-01-11 NOTE — TOC Transition Note (Signed)
Transition of Care [facility]) - CM/SW Discharge Note   Patient Details  Name: Harold Pittman MRN: [id number] Date of Birth: 07/23/1940  [facility]) CM/SW Contact:  Carles Collet, RN Phone Number: 01/11/2019, 1:48 PM   Clinical Narrative:   Damaris Schooner w patient at bedside. We discussed benefits of HH and DC needs. He would to follow up with outpatient services rather than [facility]. He has a cane and a RW for use already. Referral made to [address]. No other CM needs identified at this time.     Final next level of care: Home/Self Care Barriers to Discharge: Continued Medical Work up   Patient Goals and CMS Choice Patient states their goals for this hospitalization and ongoing recovery are:: to go home      Discharge Placement                       Discharge Plan and Services                                     Social Determinants of Health (SDOH) Interventions     Readmission Risk Interventions No flowsheet data found.

## 2019-01-11 NOTE — Progress Notes (Signed)
PROGRESS NOTE    Harold Pittman  MGQ:[id number] DOB: Jul 13, 1940 DOA: 01/09/2019 PCP: Cassandria Anger, MD  Outpatient Specialists:    Brief Narrative: Harold Pittman is a 78 y.o. male with medical history significant of hypertension, hyperlipidemia, CAD s/p CABG in 2671, diastolic CHF( EF noted to be 60 to 65% with grade 1 DD), chronic dyspnea, pulmonary hypertension, diabetes mellitus type 2, anemia, gout, gastric adenoma, and GERD.  Patient was admitted with hypertensive urgency.  Blood pressure is reasonably controlled.  Slight worsening of the renal function is noted.  Potassium is down to 2.9.  We will continue to monitor blood pressure closely.  Will correct hypokalemia.  Likely discharge home tomorrow.  Assessment & Plan:   Principal Problem:   Hypertensive urgency  Hypertensive urgency: -Patient maintains compliance with blood pressure medication, but also reports some memory problems.   -Blood pressure is down to 149/79 mmHg. -Patient is currently on Norvasc 10 mg p.o. once daily, Coreg 25 Mg p.o. twice daily, hydralazine 50 mg p.o. 3 times daily, irbesartan 150 mg p.o. once daily (patient has benazepril allergy) and torsemide 100 mg p.o. once daily. -Need to compliant with antihypertensives was discussed with the patient and patient's son extensively. -Continue to monitor blood pressure for now.  Hypokalemia: -K-Dur 40 M EQ p.o. every 4 hourly x2 doses. -Repeat BMP. -Patient may need potassium supplement on discharge.  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 -Slight rising serum creatinine from 1.22 to 1.41.  Diastolic congestive heart failure: -Continue torsemide. -Edema has resolved. -Cannot entirely rule out mild exacerbation.  COPD: -Chronic. -Stable.  Diabetes mellitus: -Continue to monitor blood sugar closely.  Hyperlipidemia: -Continue current medication.  Elevated PSA: -Kindly arrange urology follow-up on discharge.  DVT prophylaxis: Subcutaneous Lovenox Code Status: Full code Family Communication: Son Disposition Plan: Home tomorrow   Consultants:   None  Procedures:   Echocardiogram  Antimicrobials:   None   Subjective: No chest pain No shortness of breath Edema has resolved.  Objective: Vitals:   01/10/19 2218 01/11/19 0500 01/11/19 0642 01/11/19 0727  BP: (!) 152/83  (!) 151/85 134/77  Pulse: 66  76 70  Resp: 18  17 18   Temp: 98.2 F (36.8 C)  98.2 F (36.8 C) 98.1 F (36.7 C)  TempSrc: Oral  Oral Oral  SpO2: 97%  96% 96%  Weight:  83.7 kg      Intake/Output Summary (Last 24 hours) at 01/11/2019 1025 Last data filed at 01/11/2019 0844 Gross per 24 hour  Intake 1320 ml  Output -  Net 1320 ml   Filed Weights   01/11/19 0500  Weight: 83.7 kg    Examination:  General exam: Appears calm and comfortable  Respiratory system: Clear to auscultation.   Cardiovascular system: S1 & S2 heard. Gastrointestinal system: Abdomen is nondistended, soft and nontender. No organomegaly or masses felt. Normal bowel sounds heard. Central nervous system: Alert and oriented.  Patient moves all extremities. Extremities: No leg edema.  Data Reviewed: I have personally reviewed following labs and imaging studies  CBC: Recent Labs  Lab 01/09/19 1520 01/11/19 0214  WBC 5.5 5.2  HGB 12.3* 12.5*  HCT 38.1* 36.9*  MCV 84.1 81.5  PLT 203 245   Basic Metabolic Panel: Recent Labs  Lab 01/09/19 1520 01/10/19 0827 01/11/19 0214  NA 144 142 142  K 3.9 3.2* 2.9*  CL 105 104 99  CO2 26 25 30   GLUCOSE 106* 172* 143*  BUN 14 14  16  CREATININE 1.25* 1.22 1.41*  CALCIUM 9.4 9.3 9.3   GFR: Estimated Creatinine Clearance: 46.7 mL/min (A) (by C-G formula based on SCr of 1.41 mg/dL (H)). Liver Function Tests: Recent Labs  Lab 01/10/19 0827  AST 24  ALT 18  ALKPHOS 69  BILITOT 0.8  PROT 6.9  ALBUMIN 4.1   No results for input(s): LIPASE, AMYLASE in the last 168 hours. No  results for input(s): AMMONIA in the last 168 hours. Coagulation Profile: No results for input(s): INR, PROTIME in the last 168 hours. Cardiac Enzymes: No results for input(s): CKTOTAL, CKMB, CKMBINDEX, TROPONINI in the last 168 hours. BNP (last 3 results) No results for input(s): PROBNP in the last 8760 hours. HbA1C: No results for input(s): HGBA1C in the last 72 hours. CBG: Recent Labs  Lab 01/10/19 0839 01/10/19 1113 01/10/19 1616 01/10/19 2215 01/11/19 0618  GLUCAP 208* 185* 93 113* 116*   Lipid Profile: No results for input(s): CHOL, HDL, LDLCALC, TRIG, CHOLHDL, LDLDIRECT in the last 72 hours. Thyroid Function Tests: Recent Labs    01/10/19 0827  TSH 0.579   Anemia Panel: No results for input(s): VITAMINB12, FOLATE, FERRITIN, TIBC, IRON, RETICCTPCT in the last 72 hours. Urine analysis:    Component Value Date/Time   COLORURINE YELLOW 10/20/2018 1624   APPEARANCEUR CLEAR 10/20/2018 1624   LABSPEC 1.020 10/20/2018 1624   PHURINE 5.5 10/20/2018 1624   GLUCOSEU 100 (A) 10/20/2018 1624   HGBUR NEGATIVE 10/20/2018 1624   BILIRUBINUR NEGATIVE 10/20/2018 1624   KETONESUR NEGATIVE 10/20/2018 1624   PROTEINUR 100 (A) 08/13/2015 1040   UROBILINOGEN 0.2 10/20/2018 1624   NITRITE NEGATIVE 10/20/2018 1624   LEUKOCYTESUR NEGATIVE 10/20/2018 1624   Sepsis Labs: @LABRCNTIP (procalcitonin:4,lacticidven:4)  ) Recent Results (from the past 240 hour(s))  SARS CORONAVIRUS 2 (TAT 6-24 HRS) Nasopharyngeal Nasopharyngeal Swab     Status: None   Collection Time: 01/10/19  4:30 AM   Specimen: Nasopharyngeal Swab  Result Value Ref Range Status   SARS Coronavirus 2 NEGATIVE NEGATIVE Final    Comment: (NOTE) SARS-CoV-2 target nucleic acids are NOT DETECTED. The SARS-CoV-2 RNA is generally detectable in upper and lower respiratory specimens during the acute phase of infection. Negative results do not preclude SARS-CoV-2 infection, do not rule out co-infections with other pathogens,  and should not be used as the sole basis for treatment or other patient management decisions. Negative results must be combined with clinical observations, patient history, and epidemiological information. The expected result is Negative. Fact Sheet for Patients: SugarRoll.be Fact Sheet for Healthcare Providers: https://www.woods-mathews.com/ This test is not yet approved or cleared by the [place] FDA and  has been authorized for detection and/or diagnosis of SARS-CoV-2 by FDA under an Emergency Use Authorization (EUA). This EUA will remain  in effect (meaning this test can be used) for the duration of the COVID-19 declaration under Section 56 4(b)(1) of the Act, 21 U.S.C. section 360bbb-3(b)(1), unless the authorization is terminated or revoked sooner. Performed at [facility], [place] [address], [place], [place] [address]          Radiology Studies: Dg Chest 2 View  Result Date: 01/09/2019 CLINICAL DATA:  Congestive heart failure EXAM: CHEST - 2 VIEW COMPARISON:  09/24/2017 FINDINGS: Post CABG changes. The heart size and mediastinal contours are stable. No focal consolidation, pleural effusion, or pneumothorax. IMPRESSION: Stable mild cardiomegaly without acute cardiopulmonary findings. Electronically Signed   By: Davina Poke M.D.   On: 01/09/2019 15:18   Dg Ankle Complete  Right  Result Date: 01/10/2019 CLINICAL DATA:  Swelling recent fall EXAM: RIGHT ANKLE - COMPLETE 3+ VIEW COMPARISON:  None. FINDINGS: No fracture or dislocation seen. There is soft tissue swelling seen around the lateral malleolus. Small ankle joint effusion is seen. 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 Dorsal osteophytes seen in the midfoot. Calcaneal enthesophytes are noted. Surgical clips seen around the medial ankle. IMPRESSION: No acute osseous abnormality. Electronically Signed   By: Prudencio Pair M.D.   On: 01/10/2019  02:39   Dg Hand Complete Left  Result Date: 01/10/2019 CLINICAL DATA:  Swelling, recent fall EXAM: LEFT HAND - COMPLETE 3+ VIEW COMPARISON:  None. FINDINGS: There is no evidence of fracture or dislocation. 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 No focal soft tissue swelling. IMPRESSION: No acute osseus injury. Electronically Signed   By: Prudencio Pair M.D.   On: 01/10/2019 02:38        Scheduled Meds: . amLODipine  10 mg Oral Daily  . aspirin EC  81 mg Oral Daily  . carvedilol  25 mg Oral BID WC  . enoxaparin (LOVENOX) injection  40 mg Subcutaneous Q24H  . famotidine  40 mg Oral Daily  . hydrALAZINE  50 mg Oral TID  . insulin aspart  0-9 Units Subcutaneous TID WC  . irbesartan  150 mg Oral Daily  . pravastatin  80 mg Oral QHS  . torsemide  100 mg Oral Daily   Continuous Infusions:   LOS: 1 day    Time spent: 8 Minutes   Dana Allan, MD  [facility] Pager #: [phone] 7PM-7AM contact night coverage as above

## 2019-01-11 NOTE — Evaluation (Signed)
Physical Therapy Evaluation Patient Details Name: Harold Pittman MRN: [id number] DOB: 1940-04-17 Today's Date: 01/11/2019   History of Present Illness  Patient is a 78 y/o male who presents from PCP with swelling and hypertensive urgency.  PMH includes CAD s/p CABG, DM, HTN, gout, right RTC repair  Clinical Impression  Patient presents with impaired balance and impaired mobility s/p above. Pt Mod I PTA using SPC for mobility and lives alone. Does report some falls but states he is able to get up off the ground. Has support from children. Today, pt tolerated transfers and gait training with Min guard-supervision for safety with use of SPC. Balance would improve with use of RW for support which pt does have. Pt prefers OPPT vs HHPT to work on balance as pt is a fall risk and falls at home. Will follow acutely to maximize independence and mobility prior to return home.    Follow Up Recommendations Outpatient PT(pt prefers OPPT)    Equipment Recommendations  None recommended by PT    Recommendations for Other Services       Precautions / Restrictions Precautions Precautions: Fall Precaution Comments: hx of falls Restrictions Weight Bearing Restrictions: No      Mobility  Bed Mobility Overal bed mobility: Needs Assistance Bed Mobility: Supine to Sit     Supine to sit: Modified independent (Device/Increase time);HOB elevated     General bed mobility comments: No assist needed, use of rail.  Transfers Overall transfer level: Needs assistance Equipment used: Straight cane Transfers: Sit to/from Stand Sit to Stand: Supervision         General transfer comment: Supervision for safety. Stood from [employer].  Ambulation/Gait Ambulation/Gait assistance: Min guard Gait Distance (Feet): 120 Feet Assistive device: Straight cane Gait Pattern/deviations: Step-to pattern;Step-through pattern;Decreased step length - left;Staggering left;Decreased stride length;Decreased dorsiflexion -  left Gait velocity: decreased   General Gait Details: Slow, unsteady gait with decreased step length LLE and a few stumbles but able to self correct and catch self without overt LOB.  Stairs            Wheelchair Mobility    Modified Rankin (Stroke Patients Only)       Balance Overall balance assessment: Needs assistance Sitting-balance support: Feet supported;No upper extremity supported Sitting balance-Leahy Scale: Good     Standing balance support: During functional activity Standing balance-Leahy Scale: Fair Standing balance comment: Able to stand and ambulate within room short distances holding onto furniture but needs DME for any distance.                             Pertinent Vitals/Pain Pain Assessment: No/denies pain    Home Living Family/patient expects to be discharged to:: Private residence Living Arrangements: Alone Available Help at Discharge: Family;Available PRN/intermittently Type of Home: House Home Access: Level entry     Home Layout: One level Home Equipment: Walker - 2 wheels;Cane - single point      Prior Function Level of Independence: Independent with assistive device(s)         Comments: Uses SPC for ambulation vs RW. Son helps to drive places. Reports some falls but able to get up.     Hand Dominance   Dominant Hand: Right    Extremity/Trunk Assessment   Upper Extremity Assessment Upper Extremity Assessment: Defer to OT evaluation    Lower Extremity Assessment Lower Extremity Assessment: Generalized weakness    Cervical / Trunk Assessment Cervical / Trunk  Assessment: Kyphotic  Communication   Communication: No difficulties  Cognition Arousal/Alertness: Awake/alert Behavior During Therapy: WFL for tasks assessed/performed Overall Cognitive Status: Within Functional Limits for tasks assessed                                 General Comments: for basic mobility tasks.      General Comments  General comments (skin integrity, edema, etc.): Reports improved swelling in right foot and left hand.    Exercises     Assessment/Plan    PT Assessment Patient needs continued PT services  PT Problem List Decreased strength;Decreased mobility;Decreased balance;Decreased activity tolerance       PT Treatment Interventions Therapeutic activities;Gait training;Therapeutic exercise;Patient/family education;Balance training;Functional mobility training    PT Goals (Current goals can be found in the Care Plan section)  Acute Rehab PT Goals Patient Stated Goal: to go home as soon as able PT Goal Formulation: With patient Time For Goal Achievement: 01/25/19 Potential to Achieve Goals: Good    Frequency Min 3X/week   Barriers to discharge Decreased caregiver support lives alone    Co-evaluation               AM-PAC PT "6 Clicks" Mobility  Outcome Measure Help needed turning from your back to your side while in a flat bed without using bedrails?: A Little Help needed moving from lying on your back to sitting on the side of a flat bed without using bedrails?: A Little Help needed moving to and from a bed to a chair (including a wheelchair)?: A Little Help needed standing up from a chair using your arms (e.g., wheelchair or bedside chair)?: A Little Help needed to walk in hospital room?: A Little Help needed climbing 3-5 steps with a railing? : A Little 6 Click Score: 18    End of Session Equipment Utilized During Treatment: Gait belt Activity Tolerance: Patient tolerated treatment well Patient left: in bed;with call bell/phone within reach Nurse Communication: Mobility status PT Visit Diagnosis: Unsteadiness on feet (R26.81);History of falling (Z91.81);Difficulty in walking, not elsewhere classified (R26.2)    Time: 7341-9379 PT Time Calculation (min) (ACUTE ONLY): 12 min   Charges:   PT Evaluation $PT Eval Moderate Complexity: 1 Mod          [name],  PT, DPT Acute Rehabilitation Services Pager [phone] Office [phone]      [name] 01/11/2019, 2:34 PM

## 2019-01-12 ENCOUNTER — Telehealth: Payer: Self-pay | Admitting: *Deleted

## 2019-01-12 DIAGNOSIS — R778 Other specified abnormalities of plasma proteins: Secondary | ICD-10-CM

## 2019-01-12 LAB — RENAL FUNCTION PANEL
Albumin: 4 g/dL (ref 3.5–5.0)
Anion gap: 12 (ref 5–15)
BUN: 23 mg/dL (ref 8–23)
CO2: 26 mmol/L (ref 22–32)
Calcium: 9.7 mg/dL (ref 8.9–10.3)
Chloride: 105 mmol/L (ref 98–111)
Creatinine, Ser: 1.86 mg/dL — ABNORMAL HIGH (ref 0.61–1.24)
GFR calc Af Amer: 40 mL/min — ABNORMAL LOW (ref >60)
GFR calc non Af Amer: 34 mL/min — ABNORMAL LOW (ref >60)
Glucose, Bld: 219 mg/dL — ABNORMAL HIGH (ref 70–99)
Phosphorus: 4.2 mg/dL (ref 2.5–4.6)
Potassium: 3.6 mmol/L (ref 3.5–5.1)
Sodium: 143 mmol/L (ref 135–145)

## 2019-01-12 LAB — MAGNESIUM: Magnesium: 1.8 mg/dL (ref 1.7–2.4)

## 2019-01-12 LAB — GLUCOSE, CAPILLARY: Glucose-Capillary: 243 mg/dL — ABNORMAL HIGH (ref 70–99)

## 2019-01-12 MED ORDER — AMLODIPINE BESYLATE 10 MG PO TABS: 10.0000 mg | ORAL_TABLET | Freq: Every day | ORAL | 0 refills | Status: DC

## 2019-01-12 NOTE — Telephone Encounter (Signed)
Transition Care Management Follow-up Telephone Call   Date discharged? 01/12/19   How have you been since you were released from the hospital? Pt states he is doing alright   Do you understand why you were in the hospital? YES   Do you understand the discharge instructions? YES   Where were you discharged to? HOME   Items Reviewed:  Medications reviewed: YES  Allergies reviewed: YES  Dietary changes reviewed: YES, heart healthy  Referrals reviewed: No referral recommeded   Functional Questionnaire:   Activities of Daily Living (ADLs):   He states he are independent in the following: ambulation, bathing and hygiene, feeding, continence, grooming, toileting and dressing States he doesn't require assistance    Any transportation issues/concerns?: NO   Any patient concerns? NO   Confirmed importance and date/time of follow-up visits scheduled YES, appt 01/19/19  Provider Appointment booked with Dr. Alain Marion  Confirmed with patient if condition begins to worsen call PCP or go to the ER.  Patient was given the office number and encouraged to call back with question or concerns.  : YES

## 2019-01-12 NOTE — Discharge Summary (Signed)
Physician Discharge Summary  CHIP CANEPA YYT:[id number] DOB: [birth year] DOA: 01/09/2019  PCP: Cassandria Anger, MD  Admit date: 01/09/2019 Discharge date: 01/12/2019  Admitted From: Home Disposition: Home  Recommendations for Outpatient Follow-up:  1. Follow up with PCP in 1-2 weeks 2. Follow-up outpatient OT/PT 3. Please obtain BMP/CBC in one week 4. Please follow up on the following pending results:  Home Health: None Equipment/Devices: None  Discharge Condition: Stable CODE STATUS: Full code Diet recommendation: Cardiac  Subjective: Seen and examined.  No complaints.  No headache, dizziness or shortness of breath.  He states that he is ready to go home.  Brief/Interim Summary: Harold Maisel Goinsis a 78 y.o.malewith medical history significant ofhypertension, hyperlipidemia,CADs/p CABGin 1997,diastolic CHF(EF noted to be 60 to 65% with grade 1 DD),chronic dyspnea, pulmonary hypertension, diabetes mellitus type 2, anemia,gout, gastric adenoma,and GERD.  Patient was admitted with hypertensive urgency and chest pain and EKG changes.  His home medications were resumed, that improved his blood pressure but since it was a still elevated so his amlodipine was increased from 69m to 10 mg which improved his blood pressure very well.  He was noted to have T wave inversion in the lateral leads on EKG.  Serial cardiac enzymes were checked which were slightly elevated up to 18-19 but flat.  Patient did not have any chest pain since admission.  Transthoracic echo was done which ruled out any wall motion abnormality and he had normal ejection fraction but he has known diastolic dysfunction.  Patient was evaluated by PT OT and they recommended outpatient OT which he has a referral for now.  Seen today, patient feels much better without any symptoms and wants to go home.  Now that his blood pressure is fairly controlled so he will be discharged on his home medications plus increased dose of  amlodipine to 10 mg.  Of note, he has chronic kidney disease stage III and his baseline seems to be around 1.4-5 creatinine and currently it is 1.86.  Has good urine output.   Would recommend repeating his BMP with his PCP next week.    Discharge Diagnoses:  Principal Problem:   Hypertensive urgency Active Problems:   CAD (coronary artery disease)   Elevated troponin    Discharge Instructions  Discharge Instructions    Ambulatory referral to Physical Therapy   Complete by: As directed    Discharge patient   Complete by: As directed    Discharge disposition: 01-Home or Self Care   Discharge patient date: 01/12/2019     Allergies as of 01/12/2019      Reactions   Benazepril Shortness Of Breath   Cough, wheezing      Medication List    TAKE these medications   amLODipine 10 MG tablet Commonly known as: NORVASC Take 1 tablet (10 mg total) by mouth daily. Start taking on: January 13, 2019 What changed:   medication strength  how much to take   aspirin 81 MG EC tablet Take 81 mg by mouth daily.   carvedilol 25 MG tablet Commonly known as: COREG Take 1 tablet (25 mg total) by mouth 2 (two) times daily with a meal.   chlorhexidine 4 % external liquid Commonly known as: HIBICLENS Apply topically daily as needed.   Cholecalciferol 25 MCG (1000 UT) tablet Take 2,000 Units by mouth daily.   colchicine 0.6 MG tablet Take 1 tablet (0.6 mg total) by mouth 2 (two) times daily as needed. What changed: reasons to take this  docusate sodium 100 MG capsule Commonly known as: Colace Take 1 capsule (100 mg total) by mouth 2 (two) times daily.   famotidine 40 MG tablet Commonly known as: Pepcid Take 1 tablet (40 mg total) by mouth daily.   hydrALAZINE 50 MG tablet Commonly known as: APRESOLINE Take 1 tablet (50 mg total) by mouth 3 (three) times daily.   irbesartan 150 MG tablet Commonly known as: AVAPRO TAKE 1 TABLET BY MOUTH  DAILY   metFORMIN 750 MG 24 hr  tablet Commonly known as: GLUCOPHAGE-XR TAKE 1 TABLET BY MOUTH  DAILY WITH BREAKFAST   omeprazole 40 MG capsule Commonly known as: PRILOSEC Take 1 capsule (40 mg total) by mouth daily.   onetouch ultrasoft lancets Use as instructed   OneTouch Verio test strip Generic drug: glucose blood Use as instructed qd - bid   OneTouch Verio w/Device Kit 1 Units by Does not apply route daily as needed.   pravastatin 80 MG tablet Commonly known as: PRAVACHOL Take 1 tablet (80 mg total) by mouth at bedtime.   tadalafil 20 MG tablet Commonly known as: CIALIS Take 20 mg by mouth as needed for erectile dysfunction.   torsemide 100 MG tablet Commonly known as: DEMADEX Take 1 tablet (100 mg total) by mouth daily.   Vitamin B-12 1000 MCG Subl Place 1 tablet (1,000 mcg total) under the tongue daily.      Follow-up Information    [facility] Follow up.   Specialty: Rehabilitation Why: They will call you to schedule an appointment Contact information: [address] [id number] [place] [phone]       Plotnikov, Evie Lacks, MD Follow up in 1 week(s).   Specialty: Internal Medicine Contact information: [place] [address] [phone]        Sherren Mocha, MD .   Specialty: Cardiology Contact information: [address] [place] [address] [phone]          Allergies  Allergen Reactions  . Benazepril Shortness Of Breath    Cough, wheezing    Consultations: None   Procedures/Studies: Dg Chest 2 View  Result Date: 01/09/2019 CLINICAL DATA:  Congestive heart failure EXAM: CHEST - 2 VIEW COMPARISON:  09/24/2017 FINDINGS: Post CABG changes. The heart size and mediastinal contours are stable. No focal consolidation, pleural effusion, or pneumothorax. IMPRESSION: Stable mild cardiomegaly without acute cardiopulmonary findings. Electronically Signed   By:  Davina Poke M.D.   On: 01/09/2019 15:18   Dg Ankle Complete Right  Result Date: 01/10/2019 CLINICAL DATA:  Swelling recent fall EXAM: RIGHT ANKLE - COMPLETE 3+ VIEW COMPARISON:  None. FINDINGS: No fracture or dislocation seen. There is soft tissue swelling seen around the lateral malleolus. Small ankle joint effusion is seen. 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 Dorsal osteophytes seen in the midfoot. Calcaneal enthesophytes are noted. Surgical clips seen around the medial ankle. IMPRESSION: No acute osseous abnormality. Electronically Signed   By: Prudencio Pair M.D.   On: 01/10/2019 02:39   Dg Hand Complete Left  Result Date: 01/10/2019 CLINICAL DATA:  Swelling, recent fall EXAM: LEFT HAND - COMPLETE 3+ VIEW COMPARISON:  None. FINDINGS: There is no evidence of fracture or dislocation. 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 No focal soft tissue swelling. IMPRESSION: No acute osseus injury. Electronically Signed   By: Prudencio Pair M.D.   On: 01/10/2019 02:38      Discharge Exam: Vitals:   01/11/19 2237  01/12/19 0817  BP: 129/73 138/78  Pulse: 71 74  Resp: 18 17  Temp: 98.1 F (36.7 C) 97.7 F (36.5 C)  SpO2: 98% 97%   Vitals:   01/11/19 2237 01/11/19 2357 01/12/19 0300 01/12/19 0817  BP: 129/73   138/78  Pulse: 71   74  Resp: 18   17  Temp: 98.1 F (36.7 C)   97.7 F (36.5 C)  TempSrc: Oral   Oral  SpO2: 98%   97%  Weight:   83.8 kg   Height:  5' 10.98" (1.803 m)      General: Pt is alert, awake, not in acute distress Cardiovascular: RRR, S1/S2 +, no rubs, no gallops Respiratory: CTA bilaterally, no wheezing, no rhonchi Abdominal: Soft, NT, ND, bowel sounds + Extremities: no edema, no cyanosis    The results of significant diagnostics from this hospitalization (including imaging, microbiology, ancillary and laboratory) are listed below for reference.     Microbiology: Recent Results (from the past 240 hour(s))  SARS  CORONAVIRUS 2 (TAT 6-24 HRS) Nasopharyngeal Nasopharyngeal Swab     Status: None   Collection Time: 01/10/19  4:30 AM   Specimen: Nasopharyngeal Swab  Result Value Ref Range Status   SARS Coronavirus 2 NEGATIVE NEGATIVE Final    Comment: (NOTE) SARS-CoV-2 target nucleic acids are NOT DETECTED. The SARS-CoV-2 RNA is generally detectable in upper and lower respiratory specimens during the acute phase of infection. Negative results do not preclude SARS-CoV-2 infection, do not rule out co-infections with other pathogens, and should not be used as the sole basis for treatment or other patient management decisions. Negative results must be combined with clinical observations, patient history, and epidemiological information. The expected result is Negative. Fact Sheet for Patients: SugarRoll.be Fact Sheet for Healthcare Providers: https://www.woods-mathews.com/ This test is not yet approved or cleared by the [place] FDA and  has been authorized for detection and/or diagnosis of SARS-CoV-2 by FDA under an Emergency Use Authorization (EUA). This EUA will remain  in effect (meaning this test can be used) for the duration of the COVID-19 declaration under Section 56 4(b)(1) of the Act, 21 U.S.C. section 360bbb-3(b)(1), unless the authorization is terminated or revoked sooner. Performed at [facility], [place] [address], [place], [place] [address]      Labs: BNP (last 3 results) Recent Labs    01/10/19 0216  BNP 376.2*   Basic Metabolic Panel: Recent Labs  Lab 01/09/19 1520 01/10/19 0827 01/11/19 0214 01/11/19 1717 01/12/19 0747  NA 144 142 142 142 143  K 3.9 3.2* 2.9* 3.7 3.6  CL 105 104 99 101 105  CO2 _0 GLUCOSE 106* 172* 143* 189* 219*  BUN _1 CREATININE 1.25* 1.22 1.41* 1.83* 1.86*  CALCIUM 9.4 9.3 9.3 9.8 9.7  MG  --   --   --   --  1.8  PHOS  --   --   --  4.5 4.2   Liver Function  Tests: Recent Labs  Lab 01/10/19 0827 01/11/19 1717 01/12/19 0747  AST 24  --   --   ALT 18  --   --   ALKPHOS 69  --   --   BILITOT 0.8  --   --   PROT 6.9  --   --   ALBUMIN 4.1 4.1 4.0   No results for input(s): LIPASE, AMYLASE in the last 168 hours. No results for input(s): AMMONIA in the last  168 hours. CBC: Recent Labs  Lab 01/09/19 1520 01/11/19 0214  WBC 5.5 5.2  HGB 12.3* 12.5*  HCT 38.1* 36.9*  MCV 84.1 81.5  PLT 203 194   Cardiac Enzymes: No results for input(s): CKTOTAL, CKMB, CKMBINDEX, TROPONINI in the last 168 hours. BNP: Invalid input(s): POCBNP CBG: Recent Labs  Lab 01/11/19 0618 01/11/19 1059 01/11/19 1612 01/11/19 2126 01/12/19 0817  GLUCAP 116* 133* 138* 181* 243*   D-Dimer No results for input(s): DDIMER in the last 72 hours. Hgb A1c No results for input(s): HGBA1C in the last 72 hours. Lipid Profile No results for input(s): CHOL, HDL, LDLCALC, TRIG, CHOLHDL, LDLDIRECT in the last 72 hours. Thyroid function studies Recent Labs    01/10/19 0827  TSH 0.579   Anemia work up No results for input(s): VITAMINB12, FOLATE, FERRITIN, TIBC, IRON, RETICCTPCT in the last 72 hours. Urinalysis    Component Value Date/Time   COLORURINE YELLOW 10/20/2018 1624   APPEARANCEUR CLEAR 10/20/2018 1624   LABSPEC 1.020 10/20/2018 1624   PHURINE 5.5 10/20/2018 1624   GLUCOSEU 100 (A) 10/20/2018 1624   HGBUR NEGATIVE 10/20/2018 1624   BILIRUBINUR NEGATIVE 10/20/2018 1624   KETONESUR NEGATIVE 10/20/2018 1624   PROTEINUR 100 (A) 08/13/2015 1040   UROBILINOGEN 0.2 10/20/2018 1624   NITRITE NEGATIVE 10/20/2018 1624   LEUKOCYTESUR NEGATIVE 10/20/2018 1624   Sepsis Labs Invalid input(s): PROCALCITONIN,  WBC,  LACTICIDVEN Microbiology Recent Results (from the past 240 hour(s))  SARS CORONAVIRUS 2 (TAT 6-24 HRS) Nasopharyngeal Nasopharyngeal Swab     Status: None   Collection Time: 01/10/19  4:30 AM   Specimen: Nasopharyngeal Swab  Result Value Ref  Range Status   SARS Coronavirus 2 NEGATIVE NEGATIVE Final    Comment: (NOTE) SARS-CoV-2 target nucleic acids are NOT DETECTED. The SARS-CoV-2 RNA is generally detectable in upper and lower respiratory specimens during the acute phase of infection. Negative results do not preclude SARS-CoV-2 infection, do not rule out co-infections with other pathogens, and should not be used as the sole basis for treatment or other patient management decisions. Negative results must be combined with clinical observations, patient history, and epidemiological information. The expected result is Negative. Fact Sheet for Patients: SugarRoll.be Fact Sheet for Healthcare Providers: https://www.woods-mathews.com/ This test is not yet approved or cleared by the [place] FDA and  has been authorized for detection and/or diagnosis of SARS-CoV-2 by FDA under an Emergency Use Authorization (EUA). This EUA will remain  in effect (meaning this test can be used) for the duration of the COVID-19 declaration under Section 56 4(b)(1) of the Act, 21 U.S.C. section 360bbb-3(b)(1), unless the authorization is terminated or revoked sooner. Performed at [facility], [place] [address] [place], [place] [address]      Time coordinating discharge: Over 30 minutes  SIGNED:   Darliss Cheney, MD  [facility] 01/12/2019, 9:32 AM Pager [phone]  If 7PM-7AM, please contact night-coverage [email or web address] Password [email or web address]

## 2019-01-12 NOTE — Discharge Instructions (Signed)

## 2019-01-15 ENCOUNTER — Other Ambulatory Visit: Payer: Self-pay | Admitting: Internal Medicine

## 2019-01-19 ENCOUNTER — Inpatient Hospital Stay: Payer: Medicare Other | Admitting: Internal Medicine

## 2019-01-21 ENCOUNTER — Ambulatory Visit (INDEPENDENT_AMBULATORY_CARE_PROVIDER_SITE_OTHER): Payer: Medicare Other | Admitting: Internal Medicine

## 2019-01-21 ENCOUNTER — Other Ambulatory Visit: Payer: Self-pay

## 2019-01-21 ENCOUNTER — Encounter: Payer: Self-pay | Admitting: Internal Medicine

## 2019-01-21 DIAGNOSIS — I160 Hypertensive urgency: Secondary | ICD-10-CM

## 2019-01-21 DIAGNOSIS — E1151 Type 2 diabetes mellitus with diabetic peripheral angiopathy without gangrene: Secondary | ICD-10-CM

## 2019-01-21 DIAGNOSIS — I2510 Atherosclerotic heart disease of native coronary artery without angina pectoris: Secondary | ICD-10-CM

## 2019-01-21 DIAGNOSIS — I5032 Chronic diastolic (congestive) heart failure: Secondary | ICD-10-CM

## 2019-01-21 NOTE — Patient Instructions (Signed)
If you have medicare related insurance (such as traditional Medicare, Blue Cross Medicare, United Health[employer] Medicare, or similar), Please make an appointment at the scheduling desk with Jill, the Wellness Health Coach, for your Wellness visit in this office, which is a benefit with your insurance.  

## 2019-01-21 NOTE — Progress Notes (Signed)
Subjective:  Patient ID: Harold Pittman, male    DOB: 01/27/1941  Age: 78 y.o. MRN: [id number]  CC: No chief complaint on file.   HPI Harold Pittman presents for post-hosp f/u for HTN emergency - d/c'd on 10/50  F/u CHF, DM, CAD. No CP, DOE Per hx:  "Admit date: 01/09/2019 Discharge date: 01/12/2019  Admitted From: Home Disposition: Home  Recommendations for Outpatient Follow-up:  1. Follow up with PCP in 1-2 weeks 2. Follow-up outpatient OT/PT 3. Please obtain BMP/CBC in one week 4. Please follow up on the following pending results:  Home Health: None Equipment/Devices: None  Discharge Condition: Stable CODE STATUS: Full code Diet recommendation: Cardiac  Subjective: Seen and examined.  No complaints.  No headache, dizziness or shortness of breath.  He states that he is ready to go home.  Brief/Interim Summary: Harold Wallen Goinsis a 78 y.o.malewith medical history significant ofhypertension, hyperlipidemia,CADs/p CABGin 1997,diastolic CHF(EF noted to be 60 to 65% with grade 1 DD),chronic dyspnea, pulmonary hypertension, diabetes mellitus type 2, anemia,gout, gastric adenoma,and GERD.Patient was admitted with hypertensive urgency and chest pain and EKG changes.  His home medications were resumed, that improved his blood pressure but since it was a still elevated so his amlodipine was increased from 36m to 10 mg which improved his blood pressure very well.  He was noted to have T wave inversion in the lateral leads on EKG.  Serial cardiac enzymes were checked which were slightly elevated up to 18-19 but flat.  Patient did not have any chest pain since admission.  Transthoracic echo was done which ruled out any wall motion abnormality and he had normal ejection fraction but he has known diastolic dysfunction.  Patient was evaluated by PT OT and they recommended outpatient OT which he has a referral for now.  Seen today, patient feels much better without any symptoms  and wants to go home.  Now that his blood pressure is fairly controlled so he will be discharged on his home medications plus increased dose of amlodipine to 10 mg.  Of note, he has chronic kidney disease stage III and his baseline seems to be around 1.4-5 creatinine and currently it is 1.86.  Has good urine output.  Would recommend repeating his BMP with his PCP next week.    Discharge Diagnoses:  Principal Problem:   Hypertensive urgency Active Problems:   CAD (coronary artery disease)   Elevated troponin    Discharge Instructions      Discharge Instructions    Ambulatory referral to Physical Therapy   Complete by: As directed    Discharge patient   Complete by: As directed    Discharge disposition: 01-Home or Self Care   Discharge patient date: 01/12/2019          Allergies as of 01/12/2019      Reactions   Benazepril Shortness Of Breath   Cough, wheezing         Medication List    TAKE these medications   amLODipine 10 MG tablet Commonly known as: NORVASC Take 1 tablet (10 mg total) by mouth daily. Start taking on: January 13, 2019 What changed:   medication strength  how much to take   aspirin 81 MG EC tablet Take 81 mg by mouth daily.   carvedilol 25 MG tablet Commonly known as: COREG Take 1 tablet (25 mg total) by mouth 2 (two) times daily with a meal.   chlorhexidine 4 % external liquid Commonly known as: HIBICLENS Apply  topically daily as needed.   Cholecalciferol 25 MCG (1000 UT) tablet Take 2,000 Units by mouth daily.   colchicine 0.6 MG tablet Take 1 tablet (0.6 mg total) by mouth 2 (two) times daily as needed. What changed: reasons to take this   docusate sodium 100 MG capsule Commonly known as: Colace Take 1 capsule (100 mg total) by mouth 2 (two) times daily.   famotidine 40 MG tablet Commonly known as: Pepcid Take 1 tablet (40 mg total) by mouth daily.   hydrALAZINE 50 MG tablet Commonly known as:  APRESOLINE Take 1 tablet (50 mg total) by mouth 3 (three) times daily.   irbesartan 150 MG tablet Commonly known as: AVAPRO TAKE 1 TABLET BY MOUTH  DAILY   metFORMIN 750 MG 24 hr tablet Commonly known as: GLUCOPHAGE-XR TAKE 1 TABLET BY MOUTH  DAILY WITH BREAKFAST   omeprazole 40 MG capsule Commonly known as: PRILOSEC Take 1 capsule (40 mg total) by mouth daily.   onetouch ultrasoft lancets Use as instructed   OneTouch Verio test strip Generic drug: glucose blood Use as instructed qd - bid   OneTouch Verio w/Device Kit 1 Units by Does not apply route daily as needed.   pravastatin 80 MG tablet Commonly known as: PRAVACHOL Take 1 tablet (80 mg total) by mouth at bedtime.   tadalafil 20 MG tablet Commonly known as: CIALIS Take 20 mg by mouth as needed for erectile dysfunction.   torsemide 100 MG tablet Commonly known as: DEMADEX Take 1 tablet (100 mg total) by mouth daily.   Vitamin B-12 1000 MCG Subl Place 1 tablet (1,000 mcg total) under the tongue daily.   "  Outpatient Medications Prior to Visit  Medication Sig Dispense Refill  . amLODipine (NORVASC) 10 MG tablet Take 1 tablet (10 mg total) by mouth daily. 30 tablet 0  . aspirin 81 MG EC tablet Take 81 mg by mouth daily.      . Blood Glucose Monitoring Suppl (ONETOUCH VERIO) w/Device KIT 1 Units by Does not apply route daily as needed. 1 kit 1  . carvedilol (COREG) 25 MG tablet Take 1 tablet (25 mg total) by mouth 2 (two) times daily with a meal. 180 tablet 3  . chlorhexidine (HIBICLENS) 4 % external liquid Apply topically daily as needed. 236 mL 1  . Cholecalciferol 1000 UNITS tablet Take 2,000 Units by mouth daily.      . colchicine 0.6 MG tablet Take 1 tablet (0.6 mg total) by mouth 2 (two) times daily as needed. (Patient taking differently: Take 0.6 mg by mouth 2 (two) times daily as needed (for gout.). ) 60 tablet 3  . Cyanocobalamin (VITAMIN B-12) 1000 MCG SUBL Place 1 tablet (1,000 mcg total)  under the tongue daily. 100 tablet 3  . docusate sodium (COLACE) 100 MG capsule Take 1 capsule (100 mg total) by mouth 2 (two) times daily. 10 capsule 0  . famotidine (PEPCID) 40 MG tablet Take 1 tablet (40 mg total) by mouth daily. 90 tablet 3  . glucose blood (ONETOUCH VERIO) test strip Use as instructed qd - bid 100 each 11  . hydrALAZINE (APRESOLINE) 50 MG tablet TAKE 1 TABLET BY MOUTH 3  TIMES DAILY 270 tablet 3  . irbesartan (AVAPRO) 150 MG tablet TAKE 1 TABLET BY MOUTH  DAILY (Patient taking differently: Take 150 mg by mouth daily. ) 90 tablet 3  . Lancets (ONETOUCH ULTRASOFT) lancets Use as instructed 100 each 12  . metFORMIN (GLUCOPHAGE-XR) 750 MG 24 hr tablet  TAKE 1 TABLET BY MOUTH  DAILY WITH BREAKFAST (Patient taking differently: Take 750 mg by mouth daily with breakfast. ) 90 tablet 3  . omeprazole (PRILOSEC) 40 MG capsule TAKE 1 CAPSULE BY MOUTH  DAILY 90 capsule 3  . pravastatin (PRAVACHOL) 80 MG tablet Take 1 tablet (80 mg total) by mouth at bedtime. 90 tablet 3  . tadalafil (ADCIRCA/CIALIS) 20 MG tablet Take 20 mg by mouth as needed for erectile dysfunction.    . torsemide (DEMADEX) 100 MG tablet Take 1 tablet (100 mg total) by mouth daily. 30 tablet 11   No facility-administered medications prior to visit.     ROS: Review of Systems  Constitutional: Negative for appetite change, fatigue and unexpected weight change.  HENT: Negative for congestion, nosebleeds, sneezing, sore throat and trouble swallowing.   Eyes: Negative for itching and visual disturbance.  Respiratory: Negative for cough.   Cardiovascular: Negative for chest pain, palpitations and leg swelling.  Gastrointestinal: Negative for abdominal distention, blood in stool, diarrhea and nausea.  Genitourinary: Negative for frequency and hematuria.  Musculoskeletal: Positive for arthralgias and gait problem. Negative for back pain, joint swelling and neck pain.  Skin: Negative for rash.  Neurological: Negative for  dizziness, tremors, speech difficulty and weakness.  Psychiatric/Behavioral: Negative for agitation, dysphoric mood and sleep disturbance. The patient is not nervous/anxious.     Objective:  BP (!) 152/74 (BP Location: Left Arm, Patient Position: Sitting, Cuff Size: Large)   Pulse 75   Temp 98.5 F (36.9 C) (Oral)   Ht _0  (1.803 m)   Wt 188 lb (85.3 kg)   SpO2 97%   BMI 26.22 kg/m   BP Readings from Last 3 Encounters:  01/21/19 (!) 152/74  01/12/19 138/78  01/09/19 (!) 200/90    Wt Readings from Last 3 Encounters:  01/21/19 188 lb (85.3 kg)  01/12/19 184 lb 11.9 oz (83.8 kg)  01/09/19 189 lb 8 oz (86 kg)    Physical Exam Constitutional:      General: He is not in acute distress.    Appearance: He is well-developed.     Comments: NAD  Eyes:     Conjunctiva/sclera: Conjunctivae normal.     Pupils: Pupils are equal, round, and reactive to light.  Neck:     Musculoskeletal: Normal range of motion.     Thyroid: No thyromegaly.     Vascular: No JVD.  Cardiovascular:     Rate and Rhythm: Normal rate and regular rhythm.     Heart sounds: Normal heart sounds. No murmur. No friction rub. No gallop.   Pulmonary:     Effort: Pulmonary effort is normal. No respiratory distress.     Breath sounds: Normal breath sounds. No wheezing or rales.  Chest:     Chest wall: No tenderness.  Abdominal:     General: Bowel sounds are normal. There is no distension.     Palpations: Abdomen is soft. There is no mass.     Tenderness: There is no abdominal tenderness. There is no guarding or rebound.  Musculoskeletal: Normal range of motion.        General: Tenderness present.  Lymphadenopathy:     Cervical: No cervical adenopathy.  Skin:    General: Skin is warm and dry.     Findings: No rash.  Neurological:     Mental Status: He is alert and oriented to person, place, and time.     Cranial Nerves: No cranial nerve deficit.     Motor:  No abnormal muscle tone.     Coordination:  Coordination normal.     Gait: Gait abnormal.     Deep Tendon Reflexes: Reflexes are normal and symmetric.  Psychiatric:        Behavior: Behavior normal.        Thought Content: Thought content normal.        Judgment: Judgment normal.    Cane  Lab Results  Component Value Date   WBC 5.2 01/11/2019   HGB 12.5 (L) 01/11/2019   HCT 36.9 (L) 01/11/2019   PLT 194 01/11/2019   GLUCOSE 219 (H) 01/12/2019   CHOL 105 10/20/2018   TRIG 142.0 10/20/2018   HDL 31.30 (L) 10/20/2018   LDLCALC 45 10/20/2018   ALT 18 01/10/2019   AST 24 01/10/2019   NA 143 01/12/2019   K 3.6 01/12/2019   CL 105 01/12/2019   CREATININE 1.86 (H) 01/12/2019   BUN 23 01/12/2019   CO2 26 01/12/2019   TSH 0.579 01/10/2019   PSA 7.66 (H) 10/20/2018   INR 1.35 07/23/2015   HGBA1C 5.9 10/20/2018   MICROALBUR 38.7 (H) 11/24/2013    Dg Chest 2 View  Result Date: 01/09/2019 CLINICAL DATA:  Congestive heart failure EXAM: CHEST - 2 VIEW COMPARISON:  09/24/2017 FINDINGS: Post CABG changes. The heart size and mediastinal contours are stable. No focal consolidation, pleural effusion, or pneumothorax. IMPRESSION: Stable mild cardiomegaly without acute cardiopulmonary findings. Electronically Signed   By: Davina Poke M.D.   On: 01/09/2019 15:18   Dg Ankle Complete Right  Result Date: 01/10/2019 CLINICAL DATA:  Swelling recent fall EXAM: RIGHT ANKLE - COMPLETE 3+ VIEW COMPARISON:  None. FINDINGS: No fracture or dislocation seen. There is soft tissue swelling seen around the lateral malleolus. Small ankle joint effusion is seen. 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 Dorsal osteophytes seen in the midfoot. Calcaneal enthesophytes are noted. Surgical clips seen around the medial ankle. IMPRESSION: No acute osseous abnormality. Electronically Signed   By: Prudencio Pair M.D.   On: 01/10/2019 02:39   Dg Hand Complete Left  Result Date: 01/10/2019 CLINICAL DATA:  Swelling, recent fall EXAM:  LEFT HAND - COMPLETE 3+ VIEW COMPARISON:  None. FINDINGS: There is no evidence of fracture or dislocation. 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 No focal soft tissue swelling. IMPRESSION: No acute osseus injury. Electronically Signed   By: Prudencio Pair M.D.   On: 01/10/2019 02:38    Assessment & Plan:   There are no diagnoses linked to this encounter.   No orders of the defined types were placed in this encounter.    Follow-up: No follow-ups on file.  Walker Kehr, MD

## 2019-01-21 NOTE — Assessment & Plan Note (Signed)
Metformin, Pravastatin, ASA

## 2019-01-21 NOTE — Assessment & Plan Note (Signed)
Much better BP now

## 2019-01-21 NOTE — Assessment & Plan Note (Signed)
Coreg, Hydralazine, Lasix Rx  Irbesartan Compensated

## 2019-01-21 NOTE — Assessment & Plan Note (Signed)
ASA, Pravachol, Coreg Norvasc Rx No angina 

## 2019-02-19 ENCOUNTER — Ambulatory Visit: Payer: Medicare Other | Admitting: Internal Medicine

## 2019-03-19 ENCOUNTER — Other Ambulatory Visit: Payer: Self-pay

## 2019-03-19 MED ORDER — ONETOUCH VERIO FLEX SYSTEM W/DEVICE KIT: PACK | 0 refills | Status: DC

## 2019-04-22 ENCOUNTER — Encounter: Payer: Self-pay | Admitting: Internal Medicine

## 2019-04-22 ENCOUNTER — Other Ambulatory Visit: Payer: Self-pay

## 2019-04-22 ENCOUNTER — Ambulatory Visit (INDEPENDENT_AMBULATORY_CARE_PROVIDER_SITE_OTHER): Payer: Medicare Other | Admitting: Internal Medicine

## 2019-04-22 DIAGNOSIS — R269 Unspecified abnormalities of gait and mobility: Secondary | ICD-10-CM

## 2019-04-22 DIAGNOSIS — E1151 Type 2 diabetes mellitus with diabetic peripheral angiopathy without gangrene: Secondary | ICD-10-CM | POA: Diagnosis not present

## 2019-04-22 DIAGNOSIS — R972 Elevated prostate specific antigen [PSA]: Secondary | ICD-10-CM

## 2019-04-22 DIAGNOSIS — E785 Hyperlipidemia, unspecified: Secondary | ICD-10-CM | POA: Diagnosis not present

## 2019-04-22 DIAGNOSIS — I5032 Chronic diastolic (congestive) heart failure: Secondary | ICD-10-CM | POA: Diagnosis not present

## 2019-04-22 DIAGNOSIS — I160 Hypertensive urgency: Secondary | ICD-10-CM

## 2019-04-22 DIAGNOSIS — I25810 Atherosclerosis of coronary artery bypass graft(s) without angina pectoris: Secondary | ICD-10-CM

## 2019-04-22 DIAGNOSIS — M1000 Idiopathic gout, unspecified site: Secondary | ICD-10-CM

## 2019-04-22 DIAGNOSIS — I10 Essential (primary) hypertension: Secondary | ICD-10-CM | POA: Diagnosis not present

## 2019-04-22 LAB — BASIC METABOLIC PANEL
BUN: 17 mg/dL (ref 6–23)
CO2: 30 mEq/L (ref 19–32)
Calcium: 9 mg/dL (ref 8.4–10.5)
Chloride: 107 mEq/L (ref 96–112)
Creatinine, Ser: 1.45 mg/dL (ref 0.40–1.50)
GFR: 56.95 mL/min — ABNORMAL LOW (ref >60.00)
Glucose, Bld: 197 mg/dL — ABNORMAL HIGH (ref 70–99)
Potassium: 3.4 mEq/L — ABNORMAL LOW (ref 3.5–5.1)
Sodium: 145 mEq/L (ref 135–145)

## 2019-04-22 LAB — LIPID PANEL
Cholesterol: 101 mg/dL (ref 0–200)
HDL: 31.5 mg/dL — ABNORMAL LOW (ref >39.00)
LDL Cholesterol: 50 mg/dL (ref 0–99)
NonHDL: 69.49
Total CHOL/HDL Ratio: 3
Triglycerides: 99 mg/dL (ref 0.0–149.0)
VLDL: 19.8 mg/dL (ref 0.0–40.0)

## 2019-04-22 LAB — HEMOGLOBIN A1C: Hgb A1c MFr Bld: 6 % (ref 4.6–6.5)

## 2019-04-22 LAB — PSA: PSA: 8.92 ng/mL — ABNORMAL HIGH (ref 0.10–4.00)

## 2019-04-22 MED ORDER — HYDRALAZINE HCL 50 MG PO TABS: 50.0000 mg | ORAL_TABLET | Freq: Three times a day (TID) | ORAL | 3 refills | Status: DC

## 2019-04-22 MED ORDER — METFORMIN HCL ER 750 MG PO TB24: 750.0000 mg | ORAL_TABLET | Freq: Every day | ORAL | 3 refills | Status: DC

## 2019-04-22 MED ORDER — VITAMIN D3 50 MCG (2000 UT) PO CAPS: 2000.0000 [IU] | ORAL_CAPSULE | Freq: Every day | ORAL | 3 refills | Status: DC

## 2019-04-22 MED ORDER — FERROUS SULFATE 325 (65 FE) MG PO TABS: 325.0000 mg | ORAL_TABLET | Freq: Every day | ORAL | 3 refills | Status: DC

## 2019-04-22 MED ORDER — VITAMIN B-12 1000 MCG SL SUBL: 1.0000 | SUBLINGUAL_TABLET | Freq: Every day | SUBLINGUAL | 3 refills | Status: DC

## 2019-04-22 MED ORDER — IRBESARTAN 150 MG PO TABS: 150.0000 mg | ORAL_TABLET | Freq: Every day | ORAL | 3 refills | Status: DC

## 2019-04-22 MED ORDER — PRAVASTATIN SODIUM 80 MG PO TABS: 80.0000 mg | ORAL_TABLET | Freq: Every day | ORAL | 3 refills | Status: DC

## 2019-04-22 MED ORDER — TORSEMIDE 100 MG PO TABS: 100.0000 mg | ORAL_TABLET | Freq: Every day | ORAL | 3 refills | Status: DC

## 2019-04-22 MED ORDER — AMLODIPINE BESYLATE 10 MG PO TABS: 10.0000 mg | ORAL_TABLET | Freq: Every day | ORAL | 3 refills | Status: DC

## 2019-04-22 MED ORDER — ASPIRIN 81 MG PO TBEC: 81.0000 mg | DELAYED_RELEASE_TABLET | Freq: Every day | ORAL | 3 refills | Status: AC

## 2019-04-22 MED ORDER — OMEPRAZOLE 40 MG PO CPDR: 40.0000 mg | DELAYED_RELEASE_CAPSULE | Freq: Every day | ORAL | 3 refills | Status: DC

## 2019-04-22 MED ORDER — CARVEDILOL 25 MG PO TABS: 25.0000 mg | ORAL_TABLET | Freq: Two times a day (BID) | ORAL | 3 refills | Status: DC

## 2019-04-22 MED ORDER — FAMOTIDINE 40 MG PO TABS: 40.0000 mg | ORAL_TABLET | Freq: Every day | ORAL | 3 refills | Status: DC

## 2019-04-22 NOTE — Progress Notes (Signed)
Subjective:  Patient ID: Harold Pittman, male    DOB: 1940/10/16  Age: 79 y.o. MRN: [id number]  CC: No chief complaint on file.   HPI Harold Pittman presents for DM, HTN, CAD f/u Unsteady - using a cane. Occ falls  Outpatient Medications Prior to Visit  Medication Sig Dispense Refill  . aspirin 81 MG EC tablet Take 81 mg by mouth daily.      . Blood Glucose Monitoring Suppl (ONETOUCH VERIO FLEX SYSTEM) w/Device KIT Use to monitor blood sugar 1 kit 0  . carvedilol (COREG) 25 MG tablet Take 1 tablet (25 mg total) by mouth 2 (two) times daily with a meal. 180 tablet 3  . chlorhexidine (HIBICLENS) 4 % external liquid Apply topically daily as needed. 236 mL 1  . Cholecalciferol 1000 UNITS tablet Take 2,000 Units by mouth daily.      . colchicine 0.6 MG tablet Take 1 tablet (0.6 mg total) by mouth 2 (two) times daily as needed. (Patient taking differently: Take 0.6 mg by mouth 2 (two) times daily as needed (for gout.). ) 60 tablet 3  . Cyanocobalamin (VITAMIN B-12) 1000 MCG SUBL Place 1 tablet (1,000 mcg total) under the tongue daily. 100 tablet 3  . docusate sodium (COLACE) 100 MG capsule Take 1 capsule (100 mg total) by mouth 2 (two) times daily. 10 capsule 0  . famotidine (PEPCID) 40 MG tablet Take 1 tablet (40 mg total) by mouth daily. 90 tablet 3  . glucose blood (ONETOUCH VERIO) test strip Use as instructed qd - bid 100 each 11  . hydrALAZINE (APRESOLINE) 50 MG tablet TAKE 1 TABLET BY MOUTH 3  TIMES DAILY 270 tablet 3  . irbesartan (AVAPRO) 150 MG tablet TAKE 1 TABLET BY MOUTH  DAILY (Patient taking differently: Take 150 mg by mouth daily. ) 90 tablet 3  . Lancets (ONETOUCH ULTRASOFT) lancets Use as instructed 100 each 12  . metFORMIN (GLUCOPHAGE-XR) 750 MG 24 hr tablet TAKE 1 TABLET BY MOUTH  DAILY WITH BREAKFAST (Patient taking differently: Take 750 mg by mouth daily with breakfast. ) 90 tablet 3  . omeprazole (PRILOSEC) 40 MG capsule TAKE 1 CAPSULE BY MOUTH  DAILY 90 capsule 3  .  pravastatin (PRAVACHOL) 80 MG tablet Take 1 tablet (80 mg total) by mouth at bedtime. 90 tablet 3  . tadalafil (ADCIRCA/CIALIS) 20 MG tablet Take 20 mg by mouth as needed for erectile dysfunction.    . torsemide (DEMADEX) 100 MG tablet Take 1 tablet (100 mg total) by mouth daily. 30 tablet 11  . amLODipine (NORVASC) 10 MG tablet Take 1 tablet (10 mg total) by mouth daily. 30 tablet 0   No facility-administered medications prior to visit.    ROS: Review of Systems  Constitutional: Positive for fatigue. Negative for appetite change and unexpected weight change.  HENT: Negative for congestion, nosebleeds, sneezing, sore throat and trouble swallowing.   Eyes: Negative for itching and visual disturbance.  Respiratory: Negative for cough.   Cardiovascular: Negative for chest pain, palpitations and leg swelling.  Gastrointestinal: Negative for abdominal distention, blood in stool, diarrhea and nausea.  Genitourinary: Negative for frequency and hematuria.  Musculoskeletal: Negative for back pain, gait problem, joint swelling and neck pain.  Skin: Negative for rash.  Neurological: Negative for dizziness, tremors, speech difficulty and weakness.  Psychiatric/Behavioral: Negative for agitation, dysphoric mood, sleep disturbance and suicidal ideas. The patient is not nervous/anxious.     Objective:  BP (!) 172/76 (BP Location: Left Arm, Patient  Position: Sitting, Cuff Size: Large)   Pulse 75   Temp 98.3 F (36.8 C) (Oral)   Ht 5' 11"  (1.803 m)   Wt 191 lb (86.6 kg)   SpO2 95%   BMI 26.64 kg/m   BP Readings from Last 3 Encounters:  04/22/19 (!) 172/76  01/21/19 (!) 152/74  01/12/19 138/78    Wt Readings from Last 3 Encounters:  04/22/19 191 lb (86.6 kg)  01/21/19 188 lb (85.3 kg)  01/12/19 184 lb 11.9 oz (83.8 kg)    Physical Exam Constitutional:      General: He is not in acute distress.    Appearance: He is well-developed.     Comments: NAD  Eyes:     Conjunctiva/sclera:  Conjunctivae normal.     Pupils: Pupils are equal, round, and reactive to light.  Neck:     Thyroid: No thyromegaly.     Vascular: No JVD.  Cardiovascular:     Rate and Rhythm: Normal rate and regular rhythm.     Heart sounds: Normal heart sounds. No murmur. No friction rub. No gallop.   Pulmonary:     Effort: Pulmonary effort is normal. No respiratory distress.     Breath sounds: Normal breath sounds. No wheezing or rales.  Chest:     Chest wall: No tenderness.  Abdominal:     General: Bowel sounds are normal. There is no distension.     Palpations: Abdomen is soft. There is no mass.     Tenderness: There is no abdominal tenderness. There is no guarding or rebound.  Musculoskeletal:        General: No tenderness. Normal range of motion.     Cervical back: Normal range of motion.  Lymphadenopathy:     Cervical: No cervical adenopathy.  Skin:    General: Skin is warm and dry.     Findings: No rash.  Neurological:     Mental Status: He is alert and oriented to person, place, and time.     Cranial Nerves: No cranial nerve deficit.     Motor: No abnormal muscle tone.     Coordination: Coordination normal.     Gait: Gait normal.     Deep Tendon Reflexes: Reflexes are normal and symmetric.  Psychiatric:        Behavior: Behavior normal.        Thought Content: Thought content normal.        Judgment: Judgment normal.   Cane No JVD, edema  Lab Results  Component Value Date   WBC 5.2 01/11/2019   HGB 12.5 (L) 01/11/2019   HCT 36.9 (L) 01/11/2019   PLT 194 01/11/2019   GLUCOSE 219 (H) 01/12/2019   CHOL 105 10/20/2018   TRIG 142.0 10/20/2018   HDL 31.30 (L) 10/20/2018   LDLCALC 45 10/20/2018   ALT 18 01/10/2019   AST 24 01/10/2019   NA 143 01/12/2019   K 3.6 01/12/2019   CL 105 01/12/2019   CREATININE 1.86 (H) 01/12/2019   BUN 23 01/12/2019   CO2 26 01/12/2019   TSH 0.579 01/10/2019   PSA 7.66 (H) 10/20/2018   INR 1.35 07/23/2015   HGBA1C 5.9 10/20/2018    MICROALBUR 38.7 (H) 11/24/2013    DG Chest 2 View  Result Date: 01/09/2019 CLINICAL DATA:  Congestive heart failure EXAM: CHEST - 2 VIEW COMPARISON:  09/24/2017 FINDINGS: Post CABG changes. The heart size and mediastinal contours are stable. No focal consolidation, pleural effusion, or pneumothorax. IMPRESSION: Stable mild cardiomegaly without  acute cardiopulmonary findings. Electronically Signed   By: Davina Poke M.D.   On: 01/09/2019 15:18   DG Ankle Complete Right  Result Date: 01/10/2019 CLINICAL DATA:  Swelling recent fall EXAM: RIGHT ANKLE - COMPLETE 3+ VIEW COMPARISON:  None. FINDINGS: No fracture or dislocation seen. There is soft tissue swelling seen around the lateral malleolus. Small ankle joint effusion is seen. 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 Dorsal osteophytes seen in the midfoot. Calcaneal enthesophytes are noted. Surgical clips seen around the medial ankle. IMPRESSION: No acute osseous abnormality. Electronically Signed   By: Prudencio Pair M.D.   On: 01/10/2019 02:39   DG Hand Complete Left  Result Date: 01/10/2019 CLINICAL DATA:  Swelling, recent fall EXAM: LEFT HAND - COMPLETE 3+ VIEW COMPARISON:  None. FINDINGS: There is no evidence of fracture or dislocation. 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 No focal soft tissue swelling. IMPRESSION: No acute osseus injury. Electronically Signed   By: Prudencio Pair M.D.   On: 01/10/2019 02:38    Assessment & Plan:   There are no diagnoses linked to this encounter.   No orders of the defined types were placed in this encounter.    Follow-up: No follow-ups on file.  Walker Kehr, MD

## 2019-04-22 NOTE — Assessment & Plan Note (Addendum)
BP Readings from Last 3 Encounters:  04/22/19 (!) 172/76  01/21/19 (!) 152/74  01/12/19 138/78  Worse  Coreg, Hydralazine, Lasix,  Irbesartan Check BP at home Re-start Amlodipine

## 2019-04-22 NOTE — Assessment & Plan Note (Signed)
Coreg, Hydralazine, Lasix Rx  Irbesartan Re-start Amlodipine

## 2019-04-22 NOTE — Assessment & Plan Note (Signed)
Colchicine prn

## 2019-04-22 NOTE — Patient Instructions (Addendum)
Trekking poles   We are committed to keeping you informed about the COVID-19 vaccine.  As the vaccine continues to become available for each phase, we will ensure that patients who meet the criteria receive the information they need to access vaccination opportunities. Continue to check your MyChart account and [email or web address] for updates. Please review the Phase 1b information below.  Following [place]'s guidelines for the distribution of COVID-19 vaccines, we are pleased to share our plans to begin offering vaccines to those 75 and older (Phase 1b). Here are details of those plans:  [place] COVID-19 Vaccination Clinic . Appointments required. [facility] Open to those age 49 or older . Not restricted to [facility] or [facility] residents . Location: [place], [place], [place]  . Offered daily beginning: Saturday, April 18, 2019 . Hours: 8 a.m. to 1 p.m. (10 a.m. to 2 p.m. this Saturday and Sunday, Jan. 9 and 10) . Registration for vaccine clinic appointments is open as of 10 a.m., Friday, January 8 . Please visit [email or web address] to register or call [phone] . There will be no copay required for the vaccine. Insurance information will be requested if available.  [place] will offer this vaccination clinic through Sunday, January 17, after which we will switch to a larger location to offer public vaccination at a larger scale following state guidelines. We will provide additional information as details become available.   Also, people who are 75 years and older can sign up to get vaccinated against COVID-19 at clinics that begin Monday through the [facility]. The vaccinations are open to all in that age group, regardless of their health condition or living situation.  Appointments are required and can be made beginning at 8 a.m. Friday by calling [phone] and selecting option 2. Walk-ins will  not be accepted. Clinic locations are: [facility], [place]; [facility] [address] [place]; . [place] at [facility], [address], [address], [place].  Participants are asked to wear a face covering at vaccination sites. Visit [email or web address] and click on the "[email or web address] Vaccine Info" rectangle for more information about vaccinations.  In addition to the clinics above, we are working in partnership with county health agencies in [place], [place], [place] and [place] counties to ensure continuing vaccination availability in alignment with state guidelines in the weeks and months ahead.   Information on phase 1b COVID-19 vaccination clinics being offered by local county health agencies is provided in the website links below for your convenience:   . [place]  . [place]  . [place] . [place]  . [place]'s phase 1b vaccination guidelines, prioritizing those 75 and over as the next eligible group to receive the COVID-19 vaccine, are detailed at [email or web address].   Additional information: Those 75 and older who register for [place]'s COVID-19 vaccination clinic at [facility] will be provided with additional information, including the need to be observed for 15 minutes following vaccination for your safety. Our COVID-19 testing clinic will not start at this site until 2 p.m. daily to ensure no people arriving for vaccination intersect with those seeking a COVID-19 test. [place] rooms at the [facility] are clean and safe, and our staff will use all appropriate protective equipment to keep you safe. This vaccine clinic will be located in a completely separate  area of this facility than where health care is provided. No interaction will occur between patients or care teams at  this site and our vaccination clinic.   As we proceed through phases of the vaccine rollout, we remind everyone to remain vigilant in practicing the 3 W's - wear a mask, wash your hands and wait 6 feet apart from others. These safety practices are based in science and are the best tool we have to reduce the spread of the virus.   For our most current information, please visit [email or web address].

## 2019-04-22 NOTE — Assessment & Plan Note (Signed)
Try trekking poles 

## 2019-04-22 NOTE — Assessment & Plan Note (Signed)
Labs

## 2019-04-22 NOTE — Assessment & Plan Note (Signed)
A1c q3-6 mo

## 2019-04-22 NOTE — Assessment & Plan Note (Signed)
Restart Amlodipine

## 2019-04-23 MED ORDER — VITAMIN D3 50 MCG (2000 UT) PO CAPS: 2000.0000 [IU] | ORAL_CAPSULE | Freq: Every day | ORAL | 3 refills | Status: AC

## 2019-04-23 MED ORDER — OMEPRAZOLE 40 MG PO CPDR: 40.0000 mg | DELAYED_RELEASE_CAPSULE | Freq: Every day | ORAL | 3 refills | Status: DC

## 2019-04-23 MED ORDER — TORSEMIDE 100 MG PO TABS: 100.0000 mg | ORAL_TABLET | Freq: Every day | ORAL | 3 refills | Status: DC

## 2019-04-23 MED ORDER — FAMOTIDINE 40 MG PO TABS: 40.0000 mg | ORAL_TABLET | Freq: Every day | ORAL | 3 refills | Status: DC

## 2019-04-23 MED ORDER — HYDRALAZINE HCL 50 MG PO TABS: 50.0000 mg | ORAL_TABLET | Freq: Three times a day (TID) | ORAL | 3 refills | Status: DC

## 2019-04-23 MED ORDER — FERROUS SULFATE 325 (65 FE) MG PO TABS: 325.0000 mg | ORAL_TABLET | Freq: Every day | ORAL | 3 refills | Status: DC

## 2019-04-23 MED ORDER — CARVEDILOL 25 MG PO TABS: 25.0000 mg | ORAL_TABLET | Freq: Two times a day (BID) | ORAL | 3 refills | Status: DC

## 2019-04-23 MED ORDER — METFORMIN HCL ER 750 MG PO TB24: 750.0000 mg | ORAL_TABLET | Freq: Every day | ORAL | 3 refills | Status: DC

## 2019-04-23 MED ORDER — PRAVASTATIN SODIUM 80 MG PO TABS: 80.0000 mg | ORAL_TABLET | Freq: Every day | ORAL | 3 refills | Status: DC

## 2019-04-23 MED ORDER — VITAMIN B-12 1000 MCG SL SUBL: 1.0000 | SUBLINGUAL_TABLET | Freq: Every day | SUBLINGUAL | 3 refills | Status: DC

## 2019-04-23 MED ORDER — AMLODIPINE BESYLATE 10 MG PO TABS: 10.0000 mg | ORAL_TABLET | Freq: Every day | ORAL | 3 refills | Status: DC

## 2019-04-23 MED ORDER — IRBESARTAN 150 MG PO TABS: 150.0000 mg | ORAL_TABLET | Freq: Every day | ORAL | 3 refills | Status: DC

## 2019-04-23 NOTE — Addendum Note (Signed)
Addended by: Karren Cobble on: 04/23/2019 03:02 PM   Modules accepted: Orders

## 2019-05-06 ENCOUNTER — Telehealth: Payer: Self-pay

## 2019-05-06 NOTE — Telephone Encounter (Signed)
Increase hydralazine to 100 mg 3 times a day.  Let me know if he needs a new prescription.

## 2019-05-06 NOTE — Telephone Encounter (Signed)
New message    Pt c/o medication issue:  1. Name of Medication: torsemide (DEMADEX) 100 MG tablet  2. How are you currently taking this medication (dosage and times per day)? One daily   3. Are you having a reaction (difficulty breathing--STAT)? No   4. What is your medication issue? Receive medication on yesterday wants to discuss what's it for?

## 2019-05-06 NOTE — Telephone Encounter (Signed)
Pt notified this medication was a diuretic and was for fluid retention

## 2019-05-06 NOTE — Telephone Encounter (Signed)
Spoke with pt and he states his blood pressure has still been elevated since his OV.  Last BP reading was 151/66  He would like to know if you would like to make any changes?

## 2019-05-07 NOTE — Telephone Encounter (Signed)
LM notifying pt

## 2019-05-15 ENCOUNTER — Other Ambulatory Visit: Payer: Self-pay | Admitting: Internal Medicine

## 2019-05-15 MED ORDER — HYDRALAZINE HCL 50 MG PO TABS: 50.0000 mg | ORAL_TABLET | Freq: Three times a day (TID) | ORAL | 0 refills | Status: DC

## 2019-05-15 NOTE — Telephone Encounter (Signed)
        1. Which medications need to be refilled? (please list name of each medication and dose if known) hydrALAZINE (APRESOLINE) 50 MG tablet  2. Which pharmacy/location (including street and city if local pharmacy) is medication to be sent to? [facility] [address]  3. Do they need a 30 day or 90 day supply? [place]

## 2019-05-15 NOTE — Telephone Encounter (Signed)
Reviewed chart pt is up-to-date sent refills to requested pharmacy.../lmb  

## 2019-06-01 ENCOUNTER — Other Ambulatory Visit: Payer: Self-pay | Admitting: Internal Medicine

## 2019-06-09 ENCOUNTER — Telehealth: Payer: Self-pay | Admitting: Internal Medicine

## 2019-06-09 NOTE — Telephone Encounter (Signed)
New message:    Patient states he is unsure if he is supposed to take both of these medications at the same time or not.  furosemide (LASIX) 20 MG tablet(currently out of this medication) torsemide (DEMADEX) 100 MG tablet

## 2019-06-09 NOTE — Telephone Encounter (Signed)
Please advise 

## 2019-06-11 NOTE — Telephone Encounter (Signed)
No.  Torsemide only.  Thanks

## 2019-06-11 NOTE — Telephone Encounter (Signed)
Called and left message for patient today with correct prescription.

## 2019-06-19 ENCOUNTER — Other Ambulatory Visit: Payer: Self-pay

## 2019-06-19 MED ORDER — FUROSEMIDE 20 MG PO TABS: 40.0000 mg | ORAL_TABLET | Freq: Every morning | ORAL | 3 refills | Status: DC

## 2019-07-21 ENCOUNTER — Ambulatory Visit (INDEPENDENT_AMBULATORY_CARE_PROVIDER_SITE_OTHER): Payer: Medicare Other | Admitting: Internal Medicine

## 2019-07-21 ENCOUNTER — Other Ambulatory Visit: Payer: Self-pay | Admitting: *Deleted

## 2019-07-21 ENCOUNTER — Encounter: Payer: Self-pay | Admitting: Internal Medicine

## 2019-07-21 ENCOUNTER — Other Ambulatory Visit: Payer: Self-pay

## 2019-07-21 DIAGNOSIS — I10 Essential (primary) hypertension: Secondary | ICD-10-CM | POA: Diagnosis not present

## 2019-07-21 DIAGNOSIS — I2510 Atherosclerotic heart disease of native coronary artery without angina pectoris: Secondary | ICD-10-CM

## 2019-07-21 DIAGNOSIS — E1151 Type 2 diabetes mellitus with diabetic peripheral angiopathy without gangrene: Secondary | ICD-10-CM

## 2019-07-21 DIAGNOSIS — R269 Unspecified abnormalities of gait and mobility: Secondary | ICD-10-CM | POA: Diagnosis not present

## 2019-07-21 DIAGNOSIS — I25810 Atherosclerosis of coronary artery bypass graft(s) without angina pectoris: Secondary | ICD-10-CM

## 2019-07-21 DIAGNOSIS — J449 Chronic obstructive pulmonary disease, unspecified: Secondary | ICD-10-CM | POA: Diagnosis not present

## 2019-07-21 DIAGNOSIS — M1000 Idiopathic gout, unspecified site: Secondary | ICD-10-CM | POA: Diagnosis not present

## 2019-07-21 LAB — HEMOGLOBIN A1C: Hgb A1c MFr Bld: 6.3 % (ref 4.6–6.5)

## 2019-07-21 LAB — LIPID PANEL
Cholesterol: 120 mg/dL (ref 0–200)
HDL: 31.7 mg/dL — ABNORMAL LOW (ref >39.00)
LDL Cholesterol: 75 mg/dL (ref 0–99)
NonHDL: 88.37
Total CHOL/HDL Ratio: 4
Triglycerides: 68 mg/dL (ref 0.0–149.0)
VLDL: 13.6 mg/dL (ref 0.0–40.0)

## 2019-07-21 LAB — BASIC METABOLIC PANEL
BUN: 22 mg/dL (ref 6–23)
CO2: 29 mEq/L (ref 19–32)
Calcium: 9.4 mg/dL (ref 8.4–10.5)
Chloride: 106 mEq/L (ref 96–112)
Creatinine, Ser: 1.72 mg/dL — ABNORMAL HIGH (ref 0.40–1.50)
GFR: 46.73 mL/min — ABNORMAL LOW (ref >60.00)
Glucose, Bld: 186 mg/dL — ABNORMAL HIGH (ref 70–99)
Potassium: 3.8 mEq/L (ref 3.5–5.1)
Sodium: 143 mEq/L (ref 135–145)

## 2019-07-21 MED ORDER — CYCLOBENZAPRINE HCL 10 MG PO TABS: 10.0000 mg | ORAL_TABLET | Freq: Every day | ORAL | 3 refills | Status: DC

## 2019-07-21 NOTE — Addendum Note (Signed)
Addended by: Cresenciano Lick on: 07/21/2019 10:15 AM   Modules accepted: Orders

## 2019-07-21 NOTE — Progress Notes (Signed)
Subjective:  Patient ID: [name], male    DOB: 1940-12-30  Age: 79 y.o. MRN: [id number]  CC: No chief complaint on file.   HPI Marsh Dolly Fors presents for HTN, DM, gout, hypokalemia C/o R ankle, R knee swelling - a flare up, NT   Outpatient Medications Prior to Visit  Medication Sig Dispense Refill  . aspirin 81 MG EC tablet Take 1 tablet (81 mg total) by mouth daily. 100 tablet 3  . Blood Glucose Monitoring Suppl (ONETOUCH VERIO FLEX SYSTEM) w/Device KIT Use to monitor blood sugar 1 kit 0  . carvedilol (COREG) 25 MG tablet Take 1 tablet (25 mg total) by mouth 2 (two) times daily with a meal. 180 tablet 3  . chlorhexidine (HIBICLENS) 4 % external liquid Apply topically daily as needed. 236 mL 1  . Cholecalciferol (VITAMIN D3) 50 MCG (2000 UT) capsule Take 1 capsule (2,000 Units total) by mouth daily. 100 capsule 3  . colchicine 0.6 MG tablet Take 1 tablet (0.6 mg total) by mouth 2 (two) times daily as needed. (Patient taking differently: Take 0.6 mg by mouth 2 (two) times daily as needed (for gout.). ) 60 tablet 3  . Cyanocobalamin (VITAMIN B-12) 1000 MCG SUBL Place 1 tablet (1,000 mcg total) under the tongue daily. 100 tablet 3  . cyclobenzaprine (FLEXERIL) 10 MG tablet Take 10 mg by mouth at bedtime as needed for muscle spasms.    [facility] docusate sodium (COLACE) 100 MG capsule Take 1 capsule (100 mg total) by mouth 2 (two) times daily. 10 capsule 0  . famotidine (PEPCID) 40 MG tablet Take 1 tablet (40 mg total) by mouth daily. 90 tablet 3  . ferrous sulfate 325 (65 FE) MG tablet Take 1 tablet (325 mg total) by mouth daily. 90 tablet 3  . furosemide (LASIX) 20 MG tablet Take 2 tablets (40 mg total) by mouth every morning. 180 tablet 3  . glucose blood (ONETOUCH VERIO) test strip Use as instructed qd - bid 100 each 11  . hydrALAZINE (APRESOLINE) 50 MG tablet Take 1 tablet (50 mg total) by mouth 3 (three) times daily. 270 tablet 0  . irbesartan (AVAPRO) 150 MG tablet Take 1 tablet (150  mg total) by mouth daily. 90 tablet 3  . Lancets (ONETOUCH ULTRASOFT) lancets Use as instructed 100 each 12  . metFORMIN (GLUCOPHAGE-XR) 750 MG 24 hr tablet Take 1 tablet (750 mg total) by mouth daily with breakfast. 90 tablet 3  . omeprazole (PRILOSEC) 40 MG capsule Take 1 capsule (40 mg total) by mouth daily. 90 capsule 3  . pravastatin (PRAVACHOL) 80 MG tablet Take 1 tablet (80 mg total) by mouth at bedtime. 90 tablet 3  . tadalafil (ADCIRCA/CIALIS) 20 MG tablet Take 20 mg by mouth as needed for erectile dysfunction.    . torsemide (DEMADEX) 100 MG tablet Take 1 tablet (100 mg total) by mouth daily. 90 tablet 3  . amLODipine (NORVASC) 10 MG tablet Take 1 tablet (10 mg total) by mouth daily. 90 tablet 3   No facility-administered medications prior to visit.    ROS: Review of Systems  Constitutional: Negative for appetite change, fatigue and unexpected weight change.  HENT: Negative for congestion, nosebleeds, sneezing, sore throat and trouble swallowing.   Eyes: Negative for itching and visual disturbance.  Respiratory: Negative for cough.   Cardiovascular: Negative for chest pain, palpitations and leg swelling.  Gastrointestinal: Negative for abdominal distention, blood in stool, diarrhea and nausea.  Genitourinary: Negative for frequency and  hematuria.  Musculoskeletal: Positive for arthralgias. Negative for back pain, gait problem, joint swelling and neck pain.  Skin: Negative for rash.  Neurological: Negative for dizziness, tremors, speech difficulty and weakness.  Psychiatric/Behavioral: Negative for agitation, dysphoric mood, sleep disturbance and suicidal ideas. The patient is not nervous/anxious.     Objective:  BP (!) 142/74 (BP Location: Left Arm, Patient Position: Sitting, Cuff Size: Large)   Pulse 70   Temp 98 F (36.7 C) (Oral)   Ht 5' 11"  (1.803 m)   Wt 186 lb (84.4 kg)   SpO2 96%   BMI 25.94 kg/m   BP Readings from Last 3 Encounters:  07/21/19 (!) 142/74   04/22/19 (!) 172/76  01/21/19 (!) 152/74    Wt Readings from Last 3 Encounters:  07/21/19 186 lb (84.4 kg)  04/22/19 191 lb (86.6 kg)  01/21/19 188 lb (85.3 kg)    Physical Exam Constitutional:      General: He is not in acute distress.    Appearance: He is well-developed.     Comments: NAD  Eyes:     Conjunctiva/sclera: Conjunctivae normal.     Pupils: Pupils are equal, round, and reactive to light.  Neck:     Thyroid: No thyromegaly.     Vascular: No JVD.  Cardiovascular:     Rate and Rhythm: Normal rate and regular rhythm.     Heart sounds: Normal heart sounds. No murmur. No friction rub. No gallop.   Pulmonary:     Effort: Pulmonary effort is normal. No respiratory distress.     Breath sounds: Normal breath sounds. No wheezing or rales.  Chest:     Chest wall: No tenderness.  Abdominal:     General: Bowel sounds are normal. There is no distension.     Palpations: Abdomen is soft. There is no mass.     Tenderness: There is no abdominal tenderness. There is no guarding or rebound.  Musculoskeletal:        General: No tenderness. Normal range of motion.     Cervical back: Normal range of motion.  Lymphadenopathy:     Cervical: No cervical adenopathy.  Skin:    General: Skin is warm and dry.     Findings: No rash.  Neurological:     Mental Status: He is alert and oriented to person, place, and time.     Cranial Nerves: No cranial nerve deficit.     Motor: No abnormal muscle tone.     Coordination: Coordination normal.     Gait: Gait abnormal.     Deep Tendon Reflexes: Reflexes are normal and symmetric.  Psychiatric:        Behavior: Behavior normal.        Thought Content: Thought content normal.        Judgment: Judgment normal.   R ankle, R knee w/mild swelling, NT  Lab Results  Component Value Date   WBC 5.2 01/11/2019   HGB 12.5 (L) 01/11/2019   HCT 36.9 (L) 01/11/2019   PLT 194 01/11/2019   GLUCOSE 197 (H) 04/22/2019   CHOL 101 04/22/2019   TRIG  99.0 04/22/2019   HDL 31.50 (L) 04/22/2019   LDLCALC 50 04/22/2019   ALT 18 01/10/2019   AST 24 01/10/2019   NA 145 04/22/2019   K 3.4 (L) 04/22/2019   CL 107 04/22/2019   CREATININE 1.45 04/22/2019   BUN 17 04/22/2019   CO2 30 04/22/2019   TSH 0.579 01/10/2019   PSA 8.92 (H) 04/22/2019  INR 1.35 07/23/2015   HGBA1C 6.0 04/22/2019   MICROALBUR 38.7 (H) 11/24/2013    DG Chest 2 View  Result Date: 01/09/2019 CLINICAL DATA:  Congestive heart failure EXAM: CHEST - 2 VIEW COMPARISON:  09/24/2017 FINDINGS: Post CABG changes. The heart size and mediastinal contours are stable. No focal consolidation, pleural effusion, or pneumothorax. IMPRESSION: Stable mild cardiomegaly without acute cardiopulmonary findings. Electronically Signed   By: Davina Poke M.D.   On: 01/09/2019 15:18   DG Ankle Complete Right  Result Date: 01/10/2019 CLINICAL DATA:  Swelling recent fall EXAM: RIGHT ANKLE - COMPLETE 3+ VIEW COMPARISON:  None. FINDINGS: No fracture or dislocation seen. There is soft tissue swelling seen around the lateral malleolus. Small ankle joint effusion is seen. 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 Dorsal osteophytes seen in the midfoot. Calcaneal enthesophytes are noted. Surgical clips seen around the medial ankle. IMPRESSION: No acute osseous abnormality. Electronically Signed   By: Prudencio Pair M.D.   On: 01/10/2019 02:39   DG Hand Complete Left  Result Date: 01/10/2019 CLINICAL DATA:  Swelling, recent fall EXAM: LEFT HAND - COMPLETE 3+ VIEW COMPARISON:  None. FINDINGS: There is no evidence of fracture or dislocation. 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 No focal soft tissue swelling. IMPRESSION: No acute osseus injury. Electronically Signed   By: Prudencio Pair M.D.   On: 01/10/2019 02:38    Assessment & Plan:     Follow-up: No follow-ups on file.  Walker Kehr, MD

## 2019-07-21 NOTE — Assessment & Plan Note (Signed)
Coreg, Hydralazine, Lasix,  Irbesartan 

## 2019-07-21 NOTE — Assessment & Plan Note (Signed)
Labs

## 2019-07-21 NOTE — Assessment & Plan Note (Signed)
On Colchicine prn

## 2019-07-21 NOTE — Assessment & Plan Note (Signed)
ASA, Pravachol, Coreg Norvasc

## 2019-07-21 NOTE — Assessment & Plan Note (Signed)
No SOB, cough

## 2019-09-02 ENCOUNTER — Ambulatory Visit (INDEPENDENT_AMBULATORY_CARE_PROVIDER_SITE_OTHER): Payer: Medicare Other | Admitting: Internal Medicine

## 2019-09-02 ENCOUNTER — Encounter: Payer: Self-pay | Admitting: Internal Medicine

## 2019-09-02 DIAGNOSIS — E1151 Type 2 diabetes mellitus with diabetic peripheral angiopathy without gangrene: Secondary | ICD-10-CM

## 2019-09-02 DIAGNOSIS — N1832 Chronic kidney disease, stage 3b: Secondary | ICD-10-CM | POA: Diagnosis not present

## 2019-09-02 DIAGNOSIS — R110 Nausea: Secondary | ICD-10-CM | POA: Diagnosis not present

## 2019-09-02 DIAGNOSIS — R634 Abnormal weight loss: Secondary | ICD-10-CM | POA: Diagnosis not present

## 2019-09-02 MED ORDER — ONDANSETRON HCL 4 MG PO TABS
4.0000 mg | ORAL_TABLET | Freq: Three times a day (TID) | ORAL | 1 refills | Status: DC | PRN
Start: 2019-09-02 — End: 2022-01-30

## 2019-09-02 MED ORDER — CYCLOBENZAPRINE HCL 10 MG PO TABS: 10.0000 mg | ORAL_TABLET | Freq: Every day | ORAL | 3 refills | Status: DC

## 2019-09-02 NOTE — Assessment & Plan Note (Addendum)
New Labs RUQ [place] Zofran po prn

## 2019-09-02 NOTE — Assessment & Plan Note (Addendum)
Labs Renal US

## 2019-09-02 NOTE — Assessment & Plan Note (Signed)
New Labs RUQ [place]

## 2019-09-02 NOTE — Assessment & Plan Note (Signed)
CBGs >120 at home

## 2019-09-02 NOTE — Progress Notes (Signed)
Subjective:  Patient ID: Harold Pittman, male    DOB: April 17, 1940  Age: 79 y.o. MRN: [id number]  CC: Decreased appetite (x 5 days)   HPI [name] presents for weakness, fatigue, wt loss, nausea since Friday. Urine, stool look ok. No abd, chest pain, no new meds.  Outpatient Medications Prior to Visit  Medication Sig Dispense Refill  . aspirin 81 MG EC tablet Take 1 tablet (81 mg total) by mouth daily. 100 tablet 3  . Blood Glucose Monitoring Suppl (ONETOUCH VERIO FLEX SYSTEM) w/Device KIT Use to monitor blood sugar 1 kit 0  . carvedilol (COREG) 25 MG tablet Take 1 tablet (25 mg total) by mouth 2 (two) times daily with a meal. 180 tablet 3  . chlorhexidine (HIBICLENS) 4 % external liquid Apply topically daily as needed. 236 mL 1  . Cholecalciferol (VITAMIN D3) 50 MCG (2000 UT) capsule Take 1 capsule (2,000 Units total) by mouth daily. 100 capsule 3  . colchicine 0.6 MG tablet Take 1 tablet (0.6 mg total) by mouth 2 (two) times daily as needed. (Patient taking differently: Take 0.6 mg by mouth 2 (two) times daily as needed (for gout.). ) 60 tablet 3  . Cyanocobalamin (VITAMIN B-12) 1000 MCG SUBL Place 1 tablet (1,000 mcg total) under the tongue daily. 100 tablet 3  . cyclobenzaprine (FLEXERIL) 10 MG tablet Take 1 tablet (10 mg total) by mouth at bedtime. 90 tablet 3  . docusate sodium (COLACE) 100 MG capsule Take 1 capsule (100 mg total) by mouth 2 (two) times daily. 10 capsule 0  . famotidine (PEPCID) 40 MG tablet Take 1 tablet (40 mg total) by mouth daily. 90 tablet 3  . ferrous sulfate 325 (65 FE) MG tablet Take 1 tablet (325 mg total) by mouth daily. 90 tablet 3  . furosemide (LASIX) 20 MG tablet Take 2 tablets (40 mg total) by mouth every morning. 180 tablet 3  . glucose blood (ONETOUCH VERIO) test strip Use as instructed qd - bid 100 each 11  . hydrALAZINE (APRESOLINE) 50 MG tablet Take 1 tablet (50 mg total) by mouth 3 (three) times daily. 270 tablet 0  . irbesartan (AVAPRO) 150  MG tablet Take 1 tablet (150 mg total) by mouth daily. 90 tablet 3  . Lancets (ONETOUCH ULTRASOFT) lancets Use as instructed 100 each 12  . metFORMIN (GLUCOPHAGE-XR) 750 MG 24 hr tablet Take 1 tablet (750 mg total) by mouth daily with breakfast. 90 tablet 3  . omeprazole (PRILOSEC) 40 MG capsule Take 1 capsule (40 mg total) by mouth daily. 90 capsule 3  . pravastatin (PRAVACHOL) 80 MG tablet Take 1 tablet (80 mg total) by mouth at bedtime. 90 tablet 3  . tadalafil (ADCIRCA/CIALIS) 20 MG tablet Take 20 mg by mouth as needed for erectile dysfunction.    . torsemide (DEMADEX) 100 MG tablet Take 1 tablet (100 mg total) by mouth daily. 90 tablet 3  . amLODipine (NORVASC) 10 MG tablet Take 1 tablet (10 mg total) by mouth daily. 90 tablet 3   No facility-administered medications prior to visit.    ROS: Review of Systems  Constitutional: Positive for fatigue and unexpected weight change. Negative for appetite change.  HENT: Negative for congestion, nosebleeds, sneezing, sore throat and trouble swallowing.   Eyes: Negative for itching and visual disturbance.  Respiratory: Negative for cough.   Cardiovascular: Negative for chest pain, palpitations and leg swelling.  Gastrointestinal: Positive for nausea. Negative for abdominal distention, abdominal pain, blood in stool, constipation,  diarrhea and vomiting.  Genitourinary: Negative for frequency and hematuria.  Musculoskeletal: Positive for arthralgias and gait problem. Negative for back pain, joint swelling and neck pain.  Skin: Negative for rash.  Neurological: Positive for weakness. Negative for dizziness, tremors and speech difficulty.  Psychiatric/Behavioral: Negative for agitation, dysphoric mood and sleep disturbance. The patient is not nervous/anxious.     Objective:  BP 128/70 (BP Location: Left Arm, Patient Position: Sitting, Cuff Size: Normal)   Pulse 76   Temp 98.1 F (36.7 C) (Oral)   Ht 5' 11"  (1.803 m)   Wt 176 lb (79.8 kg)    SpO2 95%   BMI 24.55 kg/m   BP Readings from Last 3 Encounters:  09/02/19 128/70  07/21/19 (!) 142/74  04/22/19 (!) 172/76    Wt Readings from Last 3 Encounters:  09/02/19 176 lb (79.8 kg)  07/21/19 186 lb (84.4 kg)  04/22/19 191 lb (86.6 kg)    Physical Exam Constitutional:      General: He is not in acute distress.    Appearance: He is well-developed. He is not ill-appearing.     Comments: NAD  Eyes:     Conjunctiva/sclera: Conjunctivae normal.     Pupils: Pupils are equal, round, and reactive to light.  Neck:     Thyroid: No thyromegaly.     Vascular: No JVD.  Cardiovascular:     Rate and Rhythm: Normal rate and regular rhythm.     Heart sounds: Normal heart sounds. No murmur. No friction rub. No gallop.   Pulmonary:     Effort: Pulmonary effort is normal. No respiratory distress.     Breath sounds: Normal breath sounds. No wheezing or rales.  Chest:     Chest wall: No tenderness.  Abdominal:     General: Bowel sounds are normal. There is no distension.     Palpations: Abdomen is soft. There is no mass.     Tenderness: There is no abdominal tenderness. There is no guarding or rebound.  Musculoskeletal:        General: No tenderness. Normal range of motion.     Cervical back: Normal range of motion.  Lymphadenopathy:     Cervical: No cervical adenopathy.  Skin:    General: Skin is warm and dry.     Findings: No rash.  Neurological:     Mental Status: He is alert and oriented to person, place, and time.     Cranial Nerves: No cranial nerve deficit.     Motor: No abnormal muscle tone.     Coordination: Coordination normal.     Gait: Gait normal.     Deep Tendon Reflexes: Reflexes are normal and symmetric.  Psychiatric:        Behavior: Behavior normal.        Thought Content: Thought content normal.        Judgment: Judgment normal.   bags under eyes  Lab Results  Component Value Date   WBC 5.2 01/11/2019   HGB 12.5 (L) 01/11/2019   HCT 36.9 (L)  01/11/2019   PLT 194 01/11/2019   GLUCOSE 186 (H) 07/21/2019   CHOL 120 07/21/2019   TRIG 68.0 07/21/2019   HDL 31.70 (L) 07/21/2019   LDLCALC 75 07/21/2019   ALT 18 01/10/2019   AST 24 01/10/2019   NA 143 07/21/2019   K 3.8 07/21/2019   CL 106 07/21/2019   CREATININE 1.72 (H) 07/21/2019   BUN 22 07/21/2019   CO2 29 07/21/2019   TSH  0.579 01/10/2019   PSA 8.92 (H) 04/22/2019   INR 1.35 07/23/2015   HGBA1C 6.3 07/21/2019   MICROALBUR 38.7 (H) 11/24/2013    DG Chest 2 View  Result Date: 01/09/2019 CLINICAL DATA:  Congestive heart failure EXAM: CHEST - 2 VIEW COMPARISON:  09/24/2017 FINDINGS: Post CABG changes. The heart size and mediastinal contours are stable. No focal consolidation, pleural effusion, or pneumothorax. IMPRESSION: Stable mild cardiomegaly without acute cardiopulmonary findings. Electronically Signed   By: Davina Poke M.D.   On: 01/09/2019 15:18   DG Ankle Complete Right  Result Date: 01/10/2019 CLINICAL DATA:  Swelling recent fall EXAM: RIGHT ANKLE - COMPLETE 3+ VIEW COMPARISON:  None. FINDINGS: No fracture or dislocation seen. There is soft tissue swelling seen around the lateral malleolus. Small ankle joint effusion is seen. 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 Dorsal osteophytes seen in the midfoot. Calcaneal enthesophytes are noted. Surgical clips seen around the medial ankle. IMPRESSION: No acute osseous abnormality. Electronically Signed   By: Prudencio Pair M.D.   On: 01/10/2019 02:39   DG Hand Complete Left  Result Date: 01/10/2019 CLINICAL DATA:  Swelling, recent fall EXAM: LEFT HAND - COMPLETE 3+ VIEW COMPARISON:  None. FINDINGS: There is no evidence of fracture or dislocation. 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 No focal soft tissue swelling. IMPRESSION: No acute osseus injury. Electronically Signed   By: Prudencio Pair M.D.   On: 01/10/2019 02:38    Assessment & Plan:     Follow-up: No follow-ups on  file.  Walker Kehr, MD

## 2019-09-04 ENCOUNTER — Ambulatory Visit
Admission: RE | Admit: 2019-09-04 | Discharge: 2019-09-04 | Disposition: A | Discharge status: Home / Self Care | Payer: Medicare Other | Source: Ambulatory Visit | Attending: Internal Medicine | Admitting: Internal Medicine

## 2019-09-04 DIAGNOSIS — K8020 Calculus of gallbladder without cholecystitis without obstruction: Secondary | ICD-10-CM | POA: Diagnosis not present

## 2019-09-16 ENCOUNTER — Ambulatory Visit (INDEPENDENT_AMBULATORY_CARE_PROVIDER_SITE_OTHER): Payer: Medicare Other

## 2019-09-16 ENCOUNTER — Encounter: Payer: Self-pay | Admitting: Internal Medicine

## 2019-09-16 ENCOUNTER — Ambulatory Visit (INDEPENDENT_AMBULATORY_CARE_PROVIDER_SITE_OTHER): Payer: Medicare Other | Admitting: Internal Medicine

## 2019-09-16 ENCOUNTER — Other Ambulatory Visit: Payer: Self-pay

## 2019-09-16 VITALS — BP 122/70 | HR 73 | Temp 98.2°F | Ht 71.0 in | Wt 183.0 lb

## 2019-09-16 DIAGNOSIS — R110 Nausea: Secondary | ICD-10-CM | POA: Diagnosis not present

## 2019-09-16 DIAGNOSIS — R0600 Dyspnea, unspecified: Secondary | ICD-10-CM | POA: Diagnosis not present

## 2019-09-16 DIAGNOSIS — Z0000 Encounter for general adult medical examination without abnormal findings: Secondary | ICD-10-CM

## 2019-09-16 DIAGNOSIS — D500 Iron deficiency anemia secondary to blood loss (chronic): Secondary | ICD-10-CM | POA: Diagnosis not present

## 2019-09-16 DIAGNOSIS — R0609 Other forms of dyspnea: Secondary | ICD-10-CM

## 2019-09-16 DIAGNOSIS — K8020 Calculus of gallbladder without cholecystitis without obstruction: Secondary | ICD-10-CM | POA: Diagnosis not present

## 2019-09-16 MED ORDER — HYDRALAZINE HCL 50 MG PO TABS: 50.0000 mg | ORAL_TABLET | Freq: Three times a day (TID) | ORAL | 3 refills | Status: DC

## 2019-09-16 NOTE — Assessment & Plan Note (Signed)
Monitor CBC 

## 2019-09-16 NOTE — Assessment & Plan Note (Signed)
Abd US IMPRESSION: Mild cholelithiasis is noted without definite evidence of cholecystitis.  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  Two complex cysts are noted in the right kidney, the largest being 4.8 cm multi-septated cyst in midpole. Further evaluation with MRI is recommended to rule out potential neoplasm.   Electronically Signed   By: Marijo Conception M.D.   On: 09/04/2019 13:39   Pt declined surg ref

## 2019-09-16 NOTE — Patient Instructions (Signed)
Harold Pittman , Thank you for taking time to come for your Medicare Wellness Visit. I appreciate your ongoing commitment to your health goals. Please review the following plan we discussed and let me know if I can assist you in the future.   Screening recommendations/referrals: Colonoscopy: last done 09/26/2017; due 2023 Recommended yearly ophthalmology/optometry visit for glaucoma screening and checkup Recommended yearly dental visit for hygiene and checkup  Vaccinations: Influenza vaccine: 01/06/2019 Pneumococcal vaccine: completed Tdap vaccine: never done Shingles vaccine: never done   Covid-19: completed  Advanced directives: No  Conditions/risks identified: Yes  Next appointment: Please schedule your 1 year Medicare Wellness Visit with your Health Coach.  Preventive Care 78 Years and Older, Male Preventive care refers to lifestyle choices and visits with your health care provider that can promote health and wellness. What does preventive care include?  A yearly physical exam. This is also called an annual well check.  Dental exams once or twice a year.  Routine eye exams. Ask your health care provider how often you should have your eyes checked.  Personal lifestyle choices, including:  Daily care of your teeth and gums.  Regular physical activity.  Eating a healthy diet.  Avoiding tobacco and drug use.  Limiting alcohol use.  Practicing safe sex.  Taking low doses of aspirin every day.  Taking vitamin and mineral supplements as recommended by your health care provider. What happens during an annual well check? The services and screenings done by your health care provider during your annual well check will depend on your age, overall health, lifestyle risk factors, and family history of disease. Counseling  Your health care provider may ask you questions about your:  Alcohol use.  Tobacco use.  Drug use.  Emotional well-being.  Home and relationship  well-being.  Sexual activity.  Eating habits.  History of falls.  Memory and ability to understand (cognition).  Work and work Statistician. Screening  You may have the following tests or measurements:  Height, weight, and BMI.  Blood pressure.  Lipid and cholesterol levels. These may be checked every 5 years, or more frequently if you are over 21 years old.  Skin check.  Lung cancer screening. You may have this screening every year starting at age 55 if you have a 30-pack-year history of smoking and currently smoke or have quit within the past 15 years.  Fecal occult blood test (FOBT) of the stool. You may have this test every year starting at age 78.  Flexible sigmoidoscopy or colonoscopy. You may have a sigmoidoscopy every 5 years or a colonoscopy every 10 years starting at age 78.  Prostate cancer screening. Recommendations will vary depending on your family history and other risks.  Hepatitis C blood test.  Hepatitis B blood test.  Sexually transmitted disease (STD) testing.  Diabetes screening. This is done by checking your blood sugar (glucose) after you have not eaten for a while (fasting). You may have this done every 1-3 years.  Abdominal aortic aneurysm (AAA) screening. You may need this if you are a current or former smoker.  Osteoporosis. You may be screened starting at age 58 if you are at high risk. Talk with your health care provider about your test results, treatment options, and if necessary, the need for more tests. Vaccines  Your health care provider may recommend certain vaccines, such as:  Influenza vaccine. This is recommended every year.  Tetanus, diphtheria, and acellular pertussis (Tdap, Td) vaccine. You may need a Td booster every 10  years.  Zoster vaccine. You may need this after age 32.  Pneumococcal 13-valent conjugate (PCV13) vaccine. One dose is recommended after age 27.  Pneumococcal polysaccharide (PPSV23) vaccine. One dose is  recommended after age 67. Talk to your health care provider about which screenings and vaccines you need and how often you need them. This information is not intended to replace advice given to you by your health care provider. Make sure you discuss any questions you have with your health care provider. Document Released: 04/22/2015 Document Revised: 12/14/2015 Document Reviewed: 01/25/2015 Elsevier Interactive Patient Education  2017 [place] Prevention in the Home Falls can cause injuries. They can happen to people of all ages. There are many things you can do to make your home safe and to help prevent falls. What can I do on the outside of my home?  Regularly fix the edges of walkways and driveways and fix any cracks.  Remove anything that might make you trip as you walk through a door, such as a raised step or threshold.  Trim any bushes or trees on the path to your home.  Use bright outdoor lighting.  Clear any walking paths of anything that might make someone trip, such as rocks or tools.  Regularly check to see if handrails are loose or broken. Make sure that both sides of any steps have handrails.  Any raised decks and porches should have guardrails on the edges.  Have any leaves, snow, or ice cleared regularly.  Use sand or salt on walking paths during winter.  Clean up any spills in your garage right away. This includes oil or grease spills. What can I do in the bathroom?  Use night lights.  Install grab bars by the toilet and in the tub and shower. Do not use towel bars as grab bars.  Use non-skid mats or decals in the tub or shower.  If you need to sit down in the shower, use a plastic, non-slip stool.  Keep the floor dry. Clean up any water that spills on the floor as soon as it happens.  Remove soap buildup in the tub or shower regularly.  Attach bath mats securely with double-sided non-slip rug tape.  Do not have throw rugs and other things on  the floor that can make you trip. What can I do in the bedroom?  Use night lights.  Make sure that you have a light by your bed that is easy to reach.  Do not use any sheets or blankets that are too big for your bed. They should not hang down onto the floor.  Have a firm chair that has side arms. You can use this for support while you get dressed.  Do not have throw rugs and other things on the floor that can make you trip. What can I do in the kitchen?  Clean up any spills right away.  Avoid walking on wet floors.  Keep items that you use a lot in easy-to-reach places.  If you need to reach something above you, use a strong step stool that has a grab bar.  Keep electrical cords out of the way.  Do not use floor polish or wax that makes floors slippery. If you must use wax, use non-skid floor wax.  Do not have throw rugs and other things on the floor that can make you trip. What can I do with my stairs?  Do not leave any items on the stairs.  Make sure that  there are handrails on both sides of the stairs and use them. Fix handrails that are broken or loose. Make sure that handrails are as long as the stairways.  Check any carpeting to make sure that it is firmly attached to the stairs. Fix any carpet that is loose or worn.  Avoid having throw rugs at the top or bottom of the stairs. If you do have throw rugs, attach them to the floor with carpet tape.  Make sure that you have a light switch at the top of the stairs and the bottom of the stairs. If you do not have them, ask someone to add them for you. What else can I do to help prevent falls?  Wear shoes that:  Do not have high heels.  Have rubber bottoms.  Are comfortable and fit you well.  Are closed at the toe. Do not wear sandals.  If you use a stepladder:  Make sure that it is fully opened. Do not climb a closed stepladder.  Make sure that both sides of the stepladder are locked into place.  Ask someone to  hold it for you, if possible.  Clearly mark and make sure that you can see:  Any grab bars or handrails.  First and last steps.  Where the edge of each step is.  Use tools that help you move around (mobility aids) if they are needed. These include:  Canes.  Walkers.  Scooters.  Crutches.  Turn on the lights when you go into a dark area. Replace any light bulbs as soon as they burn out.  Set up your furniture so you have a clear path. Avoid moving your furniture around.  If any of your floors are uneven, fix them.  If there are any pets around you, be aware of where they are.  Review your medicines with your doctor. Some medicines can make you feel dizzy. This can increase your chance of falling. Ask your doctor what other things that you can do to help prevent falls. This information is not intended to replace advice given to you by your health care provider. Make sure you discuss any questions you have with your health care provider. Document Released: 01/20/2009 Document Revised: 09/01/2015 Document Reviewed: 04/30/2014 Elsevier Interactive Patient Education  2017 [employer].

## 2019-09-16 NOTE — Progress Notes (Addendum)
Subjective:   Harold Pittman is a 79 y.o. male who presents for Medicare Annual/Subsequent preventive examination.  Review of Systems:  No ROS. Medicare Wellness Visit Cardiac Risk Factors include: advanced age (>7mn, >>14women);diabetes mellitus;dyslipidemia;family history of premature cardiovascular disease;hypertension;male gender     Objective:    Vitals: BP 122/70 (BP Location: Right Arm, Cuff Size: Normal)   Pulse 73   Temp 98.2 F (36.8 C)   Ht 5' 11"  (1.803 m)   Wt 183 lb (83 kg)   SpO2 97%   BMI 25.52 kg/m   Body mass index is 25.52 kg/m.  Advanced Directives 09/16/2019 01/10/2019 01/09/2019 02/03/2018 09/25/2017 11/02/2015 08/13/2015  Does Patient Have a Medical Advance Directive? No - No No No Yes No  Copy of Healthcare Power of Attorney in Chart? - - - - - No - copy requested -  Would patient like information on creating a medical advance directive? Yes (MAU/Ambulatory/Procedural Areas - Information given) No - Patient declined No - Patient declined No - Patient declined No - Patient declined - No - patient declined information    Tobacco Social History   Tobacco Use  Smoking Status Former Smoker  Smokeless Tobacco Never Used  Tobacco Comment   Stopped 1997     Counseling given: No Comment: Stopped 1997   Clinical Intake:  Pre-visit preparation completed: Yes  Pain : No/denies pain Pain Score: 0-No pain     BMI - recorded: 25.52 Nutritional Status: BMI 25 -29 Overweight Nutritional Risks: None Diabetes: Yes CBG done?: No Did pt. bring in CBG monitor from home?: No  How often do you need to have someone help you when you read instructions, pamphlets, or other written materials from your doctor or pharmacy?: 1 - Never What is the last grade level you completed in school?: 11th Grade  Interpreter Needed?: No  Information entered by :: SLisette Abu LPN  Past Medical History:  Diagnosis Date   Anemia    low iron   Anxiety state,  unspecified    Blood in stool    Chronic airway obstruction, not elsewhere classified    Coronary artery disease    Coronary atherosclerosis of artery bypass graft    Dyspnea    ED (erectile dysfunction)    Esophageal reflux    Gout, unspecified    Osteoarthrosis, unspecified whether generalized or localized, unspecified site    Other and unspecified hyperlipidemia    Other specified disorder of rectum and anus    Personal history of tobacco use, presenting hazards to health    Sleep apnea    does not use cpap   Type II or unspecified type diabetes mellitus without mention of complication, not stated as uncontrolled    Unspecified essential hypertension    Past Surgical History:  Procedure Laterality Date   BIOPSY  09/26/2017   Procedure: BIOPSY;  Surgeon: SLadene Artist MD;  [place] [facility]  Service: Endoscopy;;   BIOPSY  02/03/2018   Procedure: BIOPSY;  Surgeon: MIrving Copas, MD;  [place] [facility]  Service: Gastroenterology;;   COLONOSCOPY WITH PROPOFOL N/A 09/26/2017   Procedure: COLONOSCOPY WITH PROPOFOL;  Surgeon: SLadene Artist MD;  [facility]  Service: Endoscopy;  Laterality: N/A;   CORONARY ARTERY BYPASS GRAFT  952  LIMA to LAD, sequential saphenous vein graft to the first and second diagonal, sequential saphenous vein graft to the intermediate OM and circumflex and SVG to RCA   ESOPHAGOGASTRODUODENOSCOPY (EGD) WITH PROPOFOL N/A  09/26/2017   Procedure: ESOPHAGOGASTRODUODENOSCOPY (EGD) WITH PROPOFOL;  Surgeon: Ladene Artist, MD;  Location: [facility] ENDOSCOPY;  Service: Endoscopy;  Laterality: N/A;   ESOPHAGOGASTRODUODENOSCOPY (EGD) WITH PROPOFOL N/A 02/03/2018   Procedure: ESOPHAGOGASTRODUODENOSCOPY (EGD);  Surgeon: Irving Copas., MD;  Location: [place];  Service: Gastroenterology;  Laterality: N/A;   KNEE SURGERY     BILATERAL   POLYPECTOMY  09/26/2017   Procedure: POLYPECTOMY;  Surgeon: Ladene Artist, MD;  Location:  [facility] ENDOSCOPY;  Service: Endoscopy;;   [employer] INJECTION  02/03/2018   Procedure: SUBMUCOSAL INJECTION;  Surgeon: Irving Copas., MD;  Location: [facility] ENDOSCOPY;  Service: Gastroenterology;;   Family History  Problem Relation Age of Onset   Depression Sister    Heart disease Sister    Heart disease Mother 2       CAD   Mental illness Father        Alzheimer's   Hypertension Other    Coronary artery disease Other        Male 1st degree relative <50   Colon cancer Neg Hx    Stomach cancer Neg Hx    Esophageal cancer Neg Hx    Liver disease Neg Hx    Pancreatic cancer Neg Hx    Rectal cancer Neg Hx    Social History   Socioeconomic History   Marital status: Widowed    Spouse name: Not on file   Number of children: Not on file   Years of education: Not on file   Highest education level: Not on file  Occupational History   Occupation: RETIRED  Tobacco Use   Smoking status: Former Smoker   Smokeless tobacco: Never Used   Tobacco comment: Stopped 1997  Substance and Sexual Activity   Alcohol use: No    Comment: Stopped 1997   Drug use: No   Sexual activity: Not Currently  Other Topics Concern   Not on file  Social History Narrative   Patient does not get regular exercise   Daily Caffeine use - 6      Wife decease 06/24/2005;       Social Determinants of Health   Financial Resource Strain:    Difficulty of Paying Living Expenses:   Food Insecurity:    Worried About Charity fundraiser in the Last Year:    Arboriculturist in the Last Year:   Transportation Needs:    Film/video editor (Medical):    Lack of Transportation (Non-Medical):   Physical Activity:    Days of Exercise per Week:    Minutes of Exercise per Session:   Stress:    Feeling of Stress :   Social Connections:    Frequency of Communication with Friends and Family:    Frequency of Social Gatherings with Friends and Family:    Attends Religious Services:     Active Member of Clubs or Organizations:    Attends Archivist Meetings:    Marital Status:     Outpatient Encounter Medications as of 09/16/2019  Medication Sig   amLODipine (NORVASC) 10 MG tablet Take 1 tablet (10 mg total) by mouth daily.   aspirin 81 MG EC tablet Take 1 tablet (81 mg total) by mouth daily.   Blood Glucose Monitoring Suppl (ONETOUCH VERIO FLEX SYSTEM) w/Device KIT Use to monitor blood sugar   carvedilol (COREG) 25 MG tablet Take 1 tablet (25 mg total) by mouth 2 (two) times daily with a  meal.   chlorhexidine (HIBICLENS) 4 % external liquid Apply topically daily as needed.   Cholecalciferol (VITAMIN D3) 50 MCG (2000 UT) capsule Take 1 capsule (2,000 Units total) by mouth daily.   colchicine 0.6 MG tablet Take 1 tablet (0.6 mg total) by mouth 2 (two) times daily as needed. (Patient taking differently: Take 0.6 mg by mouth 2 (two) times daily as needed (for gout.). )   Cyanocobalamin (VITAMIN B-12) 1000 MCG SUBL Place 1 tablet (1,000 mcg total) under the tongue daily.   cyclobenzaprine (FLEXERIL) 10 MG tablet Take 1 tablet (10 mg total) by mouth at bedtime.   docusate sodium (COLACE) 100 MG capsule Take 1 capsule (100 mg total) by mouth 2 (two) times daily.   famotidine (PEPCID) 40 MG tablet Take 1 tablet (40 mg total) by mouth daily.   ferrous sulfate 325 (65 FE) MG tablet Take 1 tablet (325 mg total) by mouth daily.   furosemide (LASIX) 20 MG tablet Take 2 tablets (40 mg total) by mouth every morning.   glucose blood (ONETOUCH VERIO) test strip Use as instructed qd - bid   hydrALAZINE (APRESOLINE) 50 MG tablet Take 1 tablet (50 mg total) by mouth 3 (three) times daily.   irbesartan (AVAPRO) 150 MG tablet Take 1 tablet (150 mg total) by mouth daily.   Lancets (ONETOUCH ULTRASOFT) lancets Use as instructed   metFORMIN (GLUCOPHAGE-XR) 750 MG 24 hr tablet Take 1 tablet (750 mg total) by mouth daily with breakfast.   omeprazole (PRILOSEC) 40 MG capsule Take 1 capsule  (40 mg total) by mouth daily.   ondansetron (ZOFRAN) 4 MG tablet Take 1 tablet (4 mg total) by mouth every 8 (eight) hours as needed for nausea or vomiting.   pravastatin (PRAVACHOL) 80 MG tablet Take 1 tablet (80 mg total) by mouth at bedtime.   tadalafil (ADCIRCA/CIALIS) 20 MG tablet Take 20 mg by mouth as needed for erectile dysfunction.   torsemide (DEMADEX) 100 MG tablet Take 1 tablet (100 mg total) by mouth daily.   No facility-administered encounter medications on file as of 09/16/2019.    Activities of Daily Living In your present state of health, do you have any difficulty performing the following activities: 09/16/2019 01/10/2019  Hearing? N N  Vision? N N  Difficulty concentrating or making decisions? Y N  Walking or climbing stairs? N N  Dressing or bathing? N N  Doing errands, shopping? N N  Preparing Food and eating ? N -  Using the Toilet? N -  In the past six months, have you accidently leaked urine? N -  Do you have problems with loss of bowel control? N -  Managing your Medications? N -  Managing your Finances? N -  Housekeeping or managing your Housekeeping? N -  Some recent data might be hidden    Patient Care Team: Plotnikov, Evie Lacks, MD as PCP - Cyndia Diver, MD as PCP - Cardiology (Cardiology) Michael Boston, MD as Consulting Physician (General Surgery) Mansouraty, Telford Nab., MD as Consulting Physician (Gastroenterology)   Assessment:   This is a routine wellness examination for Facundo.  Exercise Activities and Dietary recommendations Current Exercise Habits: The patient does not participate in regular exercise at present, Exercise limited by: None identified  Goals      Exercise 150 minutes per week (moderate activity)     Will consider getting a podometer from [facility] or drug store and track walking Try to increase 1000 steps per month may have access to walking  tracks at ball fields  while watching the grand children         Fall  Risk Fall Risk  09/16/2019 01/09/2019 01/08/2018 12/31/2016 11/02/2015  Falls in the past year? 1 1 No Yes No  Comment - - - - -  Number falls in past yr: 0 1 - 2 or more -  Injury with Fall? 0 0 - No -  Risk for fall due to : Impaired balance/gait Impaired balance/gait - - -  Follow up Falls evaluation completed;Education provided;Falls prevention discussed Falls evaluation completed;Education provided - - -   Is the patient's home free of loose throw rugs in walkways, pet beds, electrical cords, etc?   yes      Grab bars in the bathroom? no      Handrails on the stairs?   yes      Adequate lighting?   yes  Timed Get Up and Go Performed: not indicated  Depression Screen PHQ 2/9 Scores 09/16/2019 01/21/2019 01/08/2018 12/31/2016  PHQ - 2 Score 0 0 0 0    Cognitive Function MMSE - Mini Mental State Exam 11/02/2015  Not completed: (No Data)     6CIT Screen 09/16/2019  What Year? 0 points  What month? 0 points  What time? 0 points  Count back from 20 0 points  Months in reverse 0 points  Repeat phrase 4 points  Total Score 4    Immunization History  Administered Date(s) Administered   Influenza Split 03/15/2011, 02/08/2012   Influenza Whole 03/04/2006, 03/04/2007, 03/15/2009, 12/14/2009   Influenza, High Dose Seasonal PF 03/16/2013, 12/14/2014, 01/30/2016, 12/31/2016, 01/08/2018, 01/06/2019   Influenza,inj,Quad PF,6+ Mos 12/17/2013   PFIZER SARS-COV-2 Vaccination 05/14/2019, 06/04/2019   Pneumococcal Conjugate-13 09/16/2013   Pneumococcal Polysaccharide-23 12/20/2004, 03/15/2011   Tetanus 11/24/2013    Qualifies for Shingles Vaccine? Yes  Screening Tests Health Maintenance  Topic Date Due   Hepatitis C Screening  Never done   FOOT EXAM  09/19/2017   OPHTHALMOLOGY EXAM  10/09/2018   INFLUENZA VACCINE  11/08/2019   HEMOGLOBIN A1C  01/20/2020   TETANUS/TDAP  11/25/2023   COVID-19 Vaccine  Completed   PNA vac Low Risk Adult  Completed   Cancer Screenings: Lung: Low Dose CT  Chest recommended if Age 62-80 years, 30 pack-year currently smoking OR have quit w/in 15years. Patient does qualify. Colorectal: last done 09/26/2017; due 2023  Additional Screenings: Hepatitis C Screening: never done      Plan:     Reviewed health maintenance screenings with patient today and relevant education, vaccines, and/or referrals were provided.    Continue doing brain stimulating activities (puzzles, reading, adult coloring books, staying active) to keep memory sharp.    Continue to eat heart healthy diet (full of fruits, vegetables, whole grains, lean protein, water--limit salt, fat, and sugar intake) and increase physical activity as tolerated.  I have personally reviewed and noted the following in the patient's chart:   Medical and social history Use of alcohol, tobacco or illicit drugs  Current medications and supplements Functional ability and status Nutritional status Physical activity Advanced directives List of other physicians Hospitalizations, surgeries, and ER visits in previous 12 months Vitals Screenings to include cognitive, depression, and falls Referrals and appointments  In addition, I have reviewed and discussed with patient certain preventive protocols, quality metrics, and best practice recommendations. A written personalized care plan for preventive services as well as general preventive health recommendations were provided to patient.     Sheral Flow, LPN  09/16/2019  Nurse Health Advisor  Medical screening examination/treatment/procedure(s) were performed by non-physician practitioner and as supervising physician I was immediately available for consultation/collaboration.  I agree with above. Lew Dawes, MD

## 2019-09-16 NOTE — Assessment & Plan Note (Signed)
Walking 1/2 a block a day

## 2019-09-16 NOTE — Progress Notes (Signed)
Subjective:  Patient ID: Harold Pittman, male    DOB: October 26, 1940  Age: 79 y.o. MRN: [id number]  CC: No chief complaint on file.   HPI Harold Pittman presents for nausea - resolved after 3 days F/u on abd [place]  -- Cysts, stones Comes w/dtr  Outpatient Medications Prior to Visit  Medication Sig Dispense Refill  . aspirin 81 MG EC tablet Take 1 tablet (81 mg total) by mouth daily. 100 tablet 3  . Blood Glucose Monitoring Suppl (ONETOUCH VERIO FLEX SYSTEM) w/Device KIT Use to monitor blood sugar 1 kit 0  . carvedilol (COREG) 25 MG tablet Take 1 tablet (25 mg total) by mouth 2 (two) times daily with a meal. 180 tablet 3  . chlorhexidine (HIBICLENS) 4 % external liquid Apply topically daily as needed. 236 mL 1  . Cholecalciferol (VITAMIN D3) 50 MCG (2000 UT) capsule Take 1 capsule (2,000 Units total) by mouth daily. 100 capsule 3  . colchicine 0.6 MG tablet Take 1 tablet (0.6 mg total) by mouth 2 (two) times daily as needed. (Patient taking differently: Take 0.6 mg by mouth 2 (two) times daily as needed (for gout.). ) 60 tablet 3  . Cyanocobalamin (VITAMIN B-12) 1000 MCG SUBL Place 1 tablet (1,000 mcg total) under the tongue daily. 100 tablet 3  . cyclobenzaprine (FLEXERIL) 10 MG tablet Take 1 tablet (10 mg total) by mouth at bedtime. 90 tablet 3  . docusate sodium (COLACE) 100 MG capsule Take 1 capsule (100 mg total) by mouth 2 (two) times daily. 10 capsule 0  . famotidine (PEPCID) 40 MG tablet Take 1 tablet (40 mg total) by mouth daily. 90 tablet 3  . ferrous sulfate 325 (65 FE) MG tablet Take 1 tablet (325 mg total) by mouth daily. 90 tablet 3  . furosemide (LASIX) 20 MG tablet Take 2 tablets (40 mg total) by mouth every morning. 180 tablet 3  . glucose blood (ONETOUCH VERIO) test strip Use as instructed qd - bid 100 each 11  . irbesartan (AVAPRO) 150 MG tablet Take 1 tablet (150 mg total) by mouth daily. 90 tablet 3  . Lancets (ONETOUCH ULTRASOFT) lancets Use as instructed 100 each 12  .  metFORMIN (GLUCOPHAGE-XR) 750 MG 24 hr tablet Take 1 tablet (750 mg total) by mouth daily with breakfast. 90 tablet 3  . omeprazole (PRILOSEC) 40 MG capsule Take 1 capsule (40 mg total) by mouth daily. 90 capsule 3  . ondansetron (ZOFRAN) 4 MG tablet Take 1 tablet (4 mg total) by mouth every 8 (eight) hours as needed for nausea or vomiting. 21 tablet 1  . pravastatin (PRAVACHOL) 80 MG tablet Take 1 tablet (80 mg total) by mouth at bedtime. 90 tablet 3  . tadalafil (ADCIRCA/CIALIS) 20 MG tablet Take 20 mg by mouth as needed for erectile dysfunction.    . torsemide (DEMADEX) 100 MG tablet Take 1 tablet (100 mg total) by mouth daily. 90 tablet 3  . hydrALAZINE (APRESOLINE) 50 MG tablet Take 1 tablet (50 mg total) by mouth 3 (three) times daily. 270 tablet 0  . amLODipine (NORVASC) 10 MG tablet Take 1 tablet (10 mg total) by mouth daily. 90 tablet 3   No facility-administered medications prior to visit.    ROS: Review of Systems  Constitutional: Negative for appetite change, fatigue and unexpected weight change.  HENT: Negative for congestion, nosebleeds, sneezing, sore throat and trouble swallowing.   Eyes: Negative for itching and visual disturbance.  Respiratory: Negative for cough.  Cardiovascular: Negative for chest pain, palpitations and leg swelling.  Gastrointestinal: Negative for abdominal distention, blood in stool, diarrhea and nausea.  Genitourinary: Negative for frequency and hematuria.  Musculoskeletal: Negative for back pain, gait problem, joint swelling and neck pain.  Skin: Negative for rash.  Neurological: Negative for dizziness, tremors, speech difficulty and weakness.  Psychiatric/Behavioral: Negative for agitation, dysphoric mood and sleep disturbance. The patient is not nervous/anxious.     Objective:  BP 122/70   Pulse 73   Temp 98.2 F (36.8 C) (Oral)   Ht 5' 11"  (1.803 m)   Wt 183 lb (83 kg)   SpO2 97%   BMI 25.52 kg/m   BP Readings from Last 3 Encounters:    09/16/19 122/70  09/16/19 122/70  09/02/19 128/70    Wt Readings from Last 3 Encounters:  09/16/19 183 lb (83 kg)  09/16/19 183 lb (83 kg)  09/02/19 176 lb (79.8 kg)    Physical Exam Constitutional:      General: He is not in acute distress.    Appearance: He is well-developed.     Comments: NAD  Eyes:     Conjunctiva/sclera: Conjunctivae normal.     Pupils: Pupils are equal, round, and reactive to light.  Neck:     Thyroid: No thyromegaly.     Vascular: No JVD.  Cardiovascular:     Rate and Rhythm: Normal rate and regular rhythm.     Heart sounds: Normal heart sounds. No murmur. No friction rub. No gallop.   Pulmonary:     Effort: Pulmonary effort is normal. No respiratory distress.     Breath sounds: Normal breath sounds. No wheezing or rales.  Chest:     Chest wall: No tenderness.  Abdominal:     General: Bowel sounds are normal. There is no distension.     Palpations: Abdomen is soft. There is no mass.     Tenderness: There is no abdominal tenderness. There is no guarding or rebound.  Musculoskeletal:        General: No tenderness. Normal range of motion.     Cervical back: Normal range of motion.  Lymphadenopathy:     Cervical: No cervical adenopathy.  Skin:    General: Skin is warm and dry.     Findings: No rash.  Neurological:     Mental Status: He is alert and oriented to person, place, and time.     Cranial Nerves: No cranial nerve deficit.     Motor: No abnormal muscle tone.     Coordination: Coordination normal.     Gait: Gait normal.     Deep Tendon Reflexes: Reflexes are normal and symmetric.  Psychiatric:        Behavior: Behavior normal.        Thought Content: Thought content normal.        Judgment: Judgment normal.     Lab Results  Component Value Date   WBC 5.2 01/11/2019   HGB 12.5 (L) 01/11/2019   HCT 36.9 (L) 01/11/2019   PLT 194 01/11/2019   GLUCOSE 186 (H) 07/21/2019   CHOL 120 07/21/2019   TRIG 68.0 07/21/2019   HDL 31.70 (L)  07/21/2019   LDLCALC 75 07/21/2019   ALT 18 01/10/2019   AST 24 01/10/2019   NA 143 07/21/2019   K 3.8 07/21/2019   CL 106 07/21/2019   CREATININE 1.72 (H) 07/21/2019   BUN 22 07/21/2019   CO2 29 07/21/2019   TSH 0.579 01/10/2019   PSA  8.92 (H) 04/22/2019   INR 1.35 07/23/2015   HGBA1C 6.3 07/21/2019   MICROALBUR 38.7 (H) 11/24/2013    US Abdomen Complete  Result Date: 09/04/2019 CLINICAL DATA:  Nausea, weight loss. EXAM: ABDOMEN ULTRASOUND COMPLETE COMPARISON:  October 31, 2005. FINDINGS: Gallbladder: Mild cholelithiasis is noted. No gallbladder wall thickening or pericholecystic fluid is noted. The presence or absence of sonographic Murphy's sign was not reported by the technologist. Common bile duct: Diameter: 3 mm which is within normal limits. Liver: 6 mm echogenic focus is noted in right hepatic lobe most consistent with hemangioma. Multiple hepatic cysts are noted, the largest measuring 1.9 cm in right hepatic lobe. Within normal limits in parenchymal echogenicity. Portal vein is patent on color Doppler imaging with normal direction of blood flow towards the liver. IVC: Not visualized due to overlying bowel gas. Pancreas: Visualized portion is unremarkable. Spleen: Size and appearance within normal limits. Right Kidney: Length: 12.3 cm. 3.7 cm simple cyst is seen in lower pole. 3.6 cm septated cyst is seen in upper pole. 4.8 cm multi-septated cyst is noted in midpole. Echogenicity within normal limits. No hydronephrosis visualized. Left Kidney: Length: 10.5 cm. 2.6 cm simple cyst is seen in lower pole. Echogenicity within normal limits. No mass or hydronephrosis visualized. Abdominal aorta: No aneurysm visualized. Other findings: None. IMPRESSION: Mild cholelithiasis is noted without definite evidence of cholecystitis. 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 Two complex cysts are noted in the right kidney, the largest being 4.8 cm multi-septated cyst in midpole. Further evaluation with MRI is  recommended to rule out potential neoplasm. Electronically Signed   By: Marijo Conception M.D.   On: 09/04/2019 13:39    Assessment & Plan:   There are no diagnoses linked to this encounter.   Meds ordered this encounter  Medications  . hydrALAZINE (APRESOLINE) 50 MG tablet    Sig: Take 1 tablet (50 mg total) by mouth 3 (three) times daily.    Dispense:  270 tablet    Refill:  3    Requesting 1 year supply     Follow-up: No follow-ups on file.  Walker Kehr, MD

## 2019-09-16 NOTE — Assessment & Plan Note (Signed)
Resolved Abd US IMPRESSION: Mild cholelithiasis is noted without definite evidence of cholecystitis.  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  Two complex cysts are noted in the right kidney, the largest being 4.8 cm multi-septated cyst in midpole. Further evaluation with MRI is recommended to rule out potential neoplasm.   Electronically Signed   By: Marijo Conception M.D.   On: 09/04/2019 13:39

## 2019-10-07 NOTE — Telephone Encounter (Signed)
Disregard opened in error °

## 2019-10-13 DIAGNOSIS — H04123 Dry eye syndrome of bilateral lacrimal glands: Secondary | ICD-10-CM | POA: Diagnosis not present

## 2019-10-13 DIAGNOSIS — E119 Type 2 diabetes mellitus without complications: Secondary | ICD-10-CM | POA: Diagnosis not present

## 2019-10-13 DIAGNOSIS — H2513 Age-related nuclear cataract, bilateral: Secondary | ICD-10-CM | POA: Diagnosis not present

## 2019-10-13 DIAGNOSIS — H0102A Squamous blepharitis right eye, upper and lower eyelids: Secondary | ICD-10-CM | POA: Diagnosis not present

## 2019-10-13 DIAGNOSIS — H0102B Squamous blepharitis left eye, upper and lower eyelids: Secondary | ICD-10-CM | POA: Diagnosis not present

## 2019-10-14 ENCOUNTER — Telehealth: Payer: Self-pay | Admitting: Internal Medicine

## 2019-10-14 MED ORDER — HYDRALAZINE HCL 50 MG PO TABS: 50.0000 mg | ORAL_TABLET | Freq: Three times a day (TID) | ORAL | 3 refills | Status: DC

## 2019-10-14 NOTE — Telephone Encounter (Signed)
Reviewed chart pt is up-to-date sent refills to [facility]  

## 2019-10-14 NOTE — Telephone Encounter (Signed)
    Patient states he has no medication remaining  1.Medication Requested: hydrALAZINE (APRESOLINE) 50 MG tablet  2. Pharmacy (Name, Street, City):[facility], [address] AT [place]  3. On Med List: yes  4. Last Visit with PCP: 09/16/19  5. Next visit date with PCP: 11/23/19   Agent: Please be advised that RX refills may take up to 3 business days. We ask that you follow-up with your pharmacy.

## 2019-10-28 ENCOUNTER — Other Ambulatory Visit: Payer: Self-pay | Admitting: Internal Medicine

## 2019-11-18 ENCOUNTER — Other Ambulatory Visit: Payer: Medicare Other

## 2019-11-18 ENCOUNTER — Other Ambulatory Visit: Payer: Self-pay

## 2019-11-18 DIAGNOSIS — R634 Abnormal weight loss: Secondary | ICD-10-CM | POA: Diagnosis not present

## 2019-11-18 DIAGNOSIS — R110 Nausea: Secondary | ICD-10-CM | POA: Diagnosis not present

## 2019-11-18 DIAGNOSIS — E1151 Type 2 diabetes mellitus with diabetic peripheral angiopathy without gangrene: Secondary | ICD-10-CM

## 2019-11-18 DIAGNOSIS — N1832 Chronic kidney disease, stage 3b: Secondary | ICD-10-CM | POA: Diagnosis not present

## 2019-11-18 NOTE — Addendum Note (Signed)
Addended by: Cresenciano Lick on: 11/18/2019 08:30 AM   Modules accepted: Orders

## 2019-11-18 NOTE — Addendum Note (Signed)
Addended by: Cresenciano Lick on: 11/18/2019 08:31 AM   Modules accepted: Orders

## 2019-11-19 LAB — BASIC METABOLIC PANEL
BUN/Creatinine Ratio: 11 (calc) (ref 6–22)
BUN: 19 mg/dL (ref 7–25)
CO2: 28 mmol/L (ref 20–32)
Calcium: 9.5 mg/dL (ref 8.6–10.3)
Chloride: 109 mmol/L (ref 98–110)
Creat: 1.76 mg/dL — ABNORMAL HIGH (ref 0.70–1.18)
Glucose, Bld: 146 mg/dL — ABNORMAL HIGH (ref 65–99)
Potassium: 3.8 mmol/L (ref 3.5–5.3)
Sodium: 146 mmol/L (ref 135–146)

## 2019-11-19 LAB — HEPATIC FUNCTION PANEL
AG Ratio: 2 (calc) (ref 1.0–2.5)
ALT: 25 U/L (ref 9–46)
AST: 25 U/L (ref 10–35)
Albumin: 4.2 g/dL (ref 3.6–5.1)
Alkaline phosphatase (APISO): 87 U/L (ref 35–144)
Bilirubin, Direct: 0.2 mg/dL (ref 0.0–0.2)
Globulin: 2.1 g/dL (calc) (ref 1.9–3.7)
Indirect Bilirubin: 0.5 mg/dL (calc) (ref 0.2–1.2)
Total Bilirubin: 0.7 mg/dL (ref 0.2–1.2)
Total Protein: 6.3 g/dL (ref 6.1–8.1)

## 2019-11-19 LAB — CBC WITH DIFFERENTIAL/PLATELET
Absolute Monocytes: 644 cells/uL (ref 200–950)
Basophils Absolute: 28 cells/uL (ref 0–200)
Basophils Relative: 0.5 %
Eosinophils Absolute: 209 cells/uL (ref 15–500)
Eosinophils Relative: 3.8 %
HCT: 34.1 % — ABNORMAL LOW (ref 38.5–50.0)
Hemoglobin: 11.3 g/dL — ABNORMAL LOW (ref 13.2–17.1)
Lymphs Abs: 1958 cells/uL (ref 850–3900)
MCH: 29.1 pg (ref 27.0–33.0)
MCHC: 33.1 g/dL (ref 32.0–36.0)
MCV: 87.9 fL (ref 80.0–100.0)
MPV: 10.3 fL (ref 7.5–12.5)
Monocytes Relative: 11.7 %
Neutro Abs: 2662 cells/uL (ref 1500–7800)
Neutrophils Relative %: 48.4 %
Platelets: 210 10*3/uL (ref 140–400)
RBC: 3.88 10*6/uL — ABNORMAL LOW (ref 4.20–5.80)
RDW: 14.4 % (ref 11.0–15.0)
Total Lymphocyte: 35.6 %
WBC: 5.5 10*3/uL (ref 3.8–10.8)

## 2019-11-19 LAB — HEMOGLOBIN A1C
Hgb A1c MFr Bld: 5.9 % of total Hgb — ABNORMAL HIGH (ref <5.7)
Mean Plasma Glucose: 123 (calc)
eAG (mmol/L): 6.8 (calc)

## 2019-11-19 LAB — URINALYSIS
Bilirubin Urine: NEGATIVE
Glucose, UA: NEGATIVE
Hgb urine dipstick: NEGATIVE
Ketones, ur: NEGATIVE
Leukocytes,Ua: NEGATIVE
Nitrite: NEGATIVE
Specific Gravity, Urine: 1.012 (ref 1.001–1.03)
pH: 5.5 (ref 5.0–8.0)

## 2019-11-19 LAB — LIPASE: Lipase: 18 U/L (ref 7–60)

## 2019-11-19 LAB — TSH: TSH: 0.89 mIU/L (ref 0.40–4.50)

## 2019-11-23 ENCOUNTER — Ambulatory Visit (INDEPENDENT_AMBULATORY_CARE_PROVIDER_SITE_OTHER): Payer: Medicare Other | Admitting: Internal Medicine

## 2019-11-23 ENCOUNTER — Other Ambulatory Visit: Payer: Self-pay

## 2019-11-23 ENCOUNTER — Encounter: Payer: Self-pay | Admitting: Internal Medicine

## 2019-11-23 DIAGNOSIS — R600 Localized edema: Secondary | ICD-10-CM

## 2019-11-23 DIAGNOSIS — E1151 Type 2 diabetes mellitus with diabetic peripheral angiopathy without gangrene: Secondary | ICD-10-CM

## 2019-11-23 DIAGNOSIS — I10 Essential (primary) hypertension: Secondary | ICD-10-CM | POA: Diagnosis not present

## 2019-11-23 DIAGNOSIS — N1832 Chronic kidney disease, stage 3b: Secondary | ICD-10-CM

## 2019-11-23 DIAGNOSIS — L723 Sebaceous cyst: Secondary | ICD-10-CM

## 2019-11-23 DIAGNOSIS — I2510 Atherosclerotic heart disease of native coronary artery without angina pectoris: Secondary | ICD-10-CM | POA: Diagnosis not present

## 2019-11-23 NOTE — Patient Instructions (Signed)
Epidermal Cyst  An epidermal cyst is a sac made of skin tissue. The sac contains a substance called keratin. Keratin is a protein that is normally secreted through the hair follicles. When keratin becomes trapped in the top layer of skin (epidermis), it can form an epidermal cyst. Epidermal cysts can be found anywhere on your body. These cysts are usually harmless (benign), and they may not cause symptoms unless they become infected. What are the causes? This condition may be caused by:  A blocked hair follicle.  A hair that curls and re-enters the skin instead of growing straight out of the skin (ingrown hair).  A blocked pore.  Irritated skin.  An injury to the skin.  Certain conditions that are passed along from parent to child (inherited).  Human papillomavirus (HPV).  Long-term (chronic) sun damage to the skin. What increases the risk? The following factors may make you more likely to develop an epidermal cyst:  Having acne.  Being overweight.  Being 30-40 years old. What are the signs or symptoms? The only symptom of this condition may be a small, painless lump underneath the skin. When an epidermal cyst ruptures, it may become infected. Symptoms may include:  Redness.  Inflammation.  Tenderness.  Warmth.  Fever.  Keratin draining from the cyst. Keratin is grayish-white, bad-smelling substance.  Pus draining from the cyst. How is this diagnosed? This condition is diagnosed with a physical exam.  In some cases, you may have a sample of tissue (biopsy) taken from your cyst to be examined under a microscope or tested for bacteria.  You may be referred to a health care provider who specializes in skin care (dermatologist). How is this treated? In many cases, epidermal cysts go away on their own without treatment. If a cyst becomes infected, treatment may include:  Opening and draining the cyst, done by a health care provider. After draining, minor surgery to  remove the rest of the cyst may be done.  Antibiotic medicine.  Injections of medicines (steroids) that help to reduce inflammation.  Surgery to remove the cyst. Surgery may be done if the cyst: ? Becomes large. ? Bothers you. ? Has a chance of turning into cancer.  Do not try to open a cyst yourself. Follow these instructions at home:  Take over-the-counter and prescription medicines only as told by your health care provider.  If you were prescribed an antibiotic medicine, take it it as told by your health care provider. Do not stop using the antibiotic even if you start to feel better.  Keep the area around your cyst clean and dry.  Wear loose, dry clothing.  Avoid touching your cyst.  Check your cyst every day for signs of infection. Check for: ? Redness, swelling, or pain. ? Fluid or blood. ? Warmth. ? Pus or a bad smell.  Keep all follow-up visits as told by your health care provider. This is important. How is this prevented?  Wear clean, dry, clothing.  Avoid wearing tight clothing.  Keep your skin clean and dry. Take showers or baths every day. Contact a health care provider if:  Your cyst develops symptoms of infection.  Your condition is not improving or is getting worse.  You develop a cyst that looks different from other cysts you have had.  You have a fever. Get help right away if:  Redness spreads from the cyst into the surrounding area. Summary  An epidermal cyst is a sac made of skin tissue. These cysts are   usually harmless (benign), and they may not cause symptoms unless they become infected.  If a cyst becomes infected, treatment may include surgery to open and drain the cyst, or to remove it. Treatment may also include medicines by mouth or through an injection.  Take over-the-counter and prescription medicines only as told by your health care provider. If you were prescribed an antibiotic medicine, take it as told by your health care  provider. Do not stop using the antibiotic even if you start to feel better.  Contact a health care provider if your condition is not improving or is getting worse.  Keep all follow-up visits as told by your health care provider. This is important. This information is not intended to replace advice given to you by your health care provider. Make sure you discuss any questions you have with your health care provider. Document Revised: 07/17/2018 Document Reviewed: 10/07/2017 Elsevier Patient Education  2020 [employer]vier Inc[employer]  

## 2019-11-23 NOTE — Progress Notes (Signed)
Subjective:  Patient ID: Harold Pittman, male    DOB: 1940/10/29  Age: 79 y.o. MRN: [id number]  CC: No chief complaint on file.   HPI Harold Pittman presents for gout, HTN, DM, CAD f/u  Outpatient Medications Prior to Visit  Medication Sig Dispense Refill  . aspirin 81 MG EC tablet Take 1 tablet (81 mg total) by mouth daily. 100 tablet 3  . Blood Glucose Monitoring Suppl (ONETOUCH VERIO FLEX SYSTEM) w/Device KIT Use to monitor blood sugar 1 kit 0  . carvedilol (COREG) 25 MG tablet Take 1 tablet (25 mg total) by mouth 2 (two) times daily with a meal. 180 tablet 3  . chlorhexidine (HIBICLENS) 4 % external liquid Apply topically daily as needed. 236 mL 1  . Cholecalciferol (VITAMIN D3) 50 MCG (2000 UT) capsule Take 1 capsule (2,000 Units total) by mouth daily. 100 capsule 3  . colchicine 0.6 MG tablet TAKE 1 TABLET BY MOUTH  TWICE DAILY AS NEEDED 60 tablet 3  . Cyanocobalamin (VITAMIN B-12) 1000 MCG SUBL Place 1 tablet (1,000 mcg total) under the tongue daily. 100 tablet 3  . cyclobenzaprine (FLEXERIL) 10 MG tablet Take 1 tablet (10 mg total) by mouth at bedtime. 90 tablet 3  . docusate sodium (COLACE) 100 MG capsule Take 1 capsule (100 mg total) by mouth 2 (two) times daily. 10 capsule 0  . famotidine (PEPCID) 40 MG tablet Take 1 tablet (40 mg total) by mouth daily. 90 tablet 3  . ferrous sulfate 325 (65 FE) MG tablet Take 1 tablet (325 mg total) by mouth daily. 90 tablet 3  . furosemide (LASIX) 20 MG tablet Take 2 tablets (40 mg total) by mouth every morning. 180 tablet 3  . glucose blood (ONETOUCH VERIO) test strip Use as instructed qd - bid 100 each 11  . hydrALAZINE (APRESOLINE) 50 MG tablet Take 1 tablet (50 mg total) by mouth 3 (three) times daily. 90 tablet 3  . irbesartan (AVAPRO) 150 MG tablet Take 1 tablet (150 mg total) by mouth daily. 90 tablet 3  . Lancets (ONETOUCH ULTRASOFT) lancets Use as instructed 100 each 12  . metFORMIN (GLUCOPHAGE-XR) 750 MG 24 hr tablet Take 1 tablet  (750 mg total) by mouth daily with breakfast. 90 tablet 3  . omeprazole (PRILOSEC) 40 MG capsule Take 1 capsule (40 mg total) by mouth daily. 90 capsule 3  . ondansetron (ZOFRAN) 4 MG tablet Take 1 tablet (4 mg total) by mouth every 8 (eight) hours as needed for nausea or vomiting. 21 tablet 1  . pravastatin (PRAVACHOL) 80 MG tablet Take 1 tablet (80 mg total) by mouth at bedtime. 90 tablet 3  . tadalafil (ADCIRCA/CIALIS) 20 MG tablet Take 20 mg by mouth as needed for erectile dysfunction.    . torsemide (DEMADEX) 100 MG tablet Take 1 tablet (100 mg total) by mouth daily. 90 tablet 3  . amLODipine (NORVASC) 10 MG tablet Take 1 tablet (10 mg total) by mouth daily. 90 tablet 3   No facility-administered medications prior to visit.    ROS: Review of Systems  Constitutional: Negative for appetite change, fatigue and unexpected weight change.  HENT: Negative for congestion, nosebleeds, sneezing, sore throat and trouble swallowing.   Eyes: Negative for itching and visual disturbance.  Respiratory: Negative for cough.   Cardiovascular: Negative for chest pain, palpitations and leg swelling.  Gastrointestinal: Negative for abdominal distention, blood in stool, diarrhea and nausea.  Genitourinary: Negative for frequency and hematuria.  Musculoskeletal: Negative for  back pain, gait problem, joint swelling and neck pain.  Skin: Negative for rash.  Neurological: Negative for dizziness, tremors, speech difficulty and weakness.  Psychiatric/Behavioral: Negative for agitation, dysphoric mood and sleep disturbance. The patient is not nervous/anxious.     Objective:  BP 132/60 (BP Location: Left Arm, Patient Position: Sitting, Cuff Size: Large)   Pulse 72   Temp 98.4 F (36.9 C) (Oral)   Ht _0  (1.803 m)   Wt 183 lb (83 kg)   SpO2 97%   BMI 25.52 kg/m   BP Readings from Last 3 Encounters:  11/23/19 132/60  09/16/19 122/70  09/16/19 122/70    Wt Readings from Last 3 Encounters:  11/23/19  183 lb (83 kg)  09/16/19 183 lb (83 kg)  09/16/19 183 lb (83 kg)    Physical Exam Constitutional:      General: He is not in acute distress.    Appearance: He is well-developed.     Comments: NAD  Eyes:     Conjunctiva/sclera: Conjunctivae normal.     Pupils: Pupils are equal, round, and reactive to light.  Neck:     Thyroid: No thyromegaly.     Vascular: No JVD.  Cardiovascular:     Rate and Rhythm: Normal rate and regular rhythm.     Heart sounds: Normal heart sounds. No murmur heard.  No friction rub. No gallop.   Pulmonary:     Effort: Pulmonary effort is normal. No respiratory distress.     Breath sounds: Normal breath sounds. No wheezing or rales.  Chest:     Chest wall: No tenderness.  Abdominal:     General: Bowel sounds are normal. There is no distension.     Palpations: Abdomen is soft. There is no mass.     Tenderness: There is no abdominal tenderness. There is no guarding or rebound.  Musculoskeletal:        General: No tenderness. Normal range of motion.     Cervical back: Normal range of motion.  Lymphadenopathy:     Cervical: No cervical adenopathy.  Skin:    General: Skin is warm and dry.     Findings: No rash.  Neurological:     Mental Status: He is alert and oriented to person, place, and time.     Cranial Nerves: No cranial nerve deficit.     Motor: No abnormal muscle tone.     Coordination: Coordination normal.     Gait: Gait normal.     Deep Tendon Reflexes: Reflexes are normal and symmetric.  Psychiatric:        Behavior: Behavior normal.        Thought Content: Thought content normal.        Judgment: Judgment normal.     Lab Results  Component Value Date   WBC 5.5 11/18/2019   HGB 11.3 (L) 11/18/2019   HCT 34.1 (L) 11/18/2019   PLT 210 11/18/2019   GLUCOSE 146 (H) 11/18/2019   CHOL 120 07/21/2019   TRIG 68.0 07/21/2019   HDL 31.70 (L) 07/21/2019   LDLCALC 75 07/21/2019   ALT 25 11/18/2019   AST 25 11/18/2019   NA 146 11/18/2019     K 3.8 11/18/2019   CL 109 11/18/2019   CREATININE 1.76 (H) 11/18/2019   BUN 19 11/18/2019   CO2 28 11/18/2019   TSH 0.89 11/18/2019   PSA 8.92 (H) 04/22/2019   INR 1.35 07/23/2015   HGBA1C 5.9 (H) 11/18/2019   MICROALBUR 38.7 (H) 11/24/2013  US Abdomen Complete  Result Date: 09/04/2019 CLINICAL DATA:  Nausea, weight loss. EXAM: ABDOMEN ULTRASOUND COMPLETE COMPARISON:  October 31, 2005. FINDINGS: Gallbladder: Mild cholelithiasis is noted. No gallbladder wall thickening or pericholecystic fluid is noted. The presence or absence of sonographic Murphy's sign was not reported by the technologist. Common bile duct: Diameter: 3 mm which is within normal limits. Liver: 6 mm echogenic focus is noted in right hepatic lobe most consistent with hemangioma. Multiple hepatic cysts are noted, the largest measuring 1.9 cm in right hepatic lobe. Within normal limits in parenchymal echogenicity. Portal vein is patent on color Doppler imaging with normal direction of blood flow towards the liver. IVC: Not visualized due to overlying bowel gas. Pancreas: Visualized portion is unremarkable. Spleen: Size and appearance within normal limits. Right Kidney: Length: 12.3 cm. 3.7 cm simple cyst is seen in lower pole. 3.6 cm septated cyst is seen in upper pole. 4.8 cm multi-septated cyst is noted in midpole. Echogenicity within normal limits. No hydronephrosis visualized. Left Kidney: Length: 10.5 cm. 2.6 cm simple cyst is seen in lower pole. Echogenicity within normal limits. No mass or hydronephrosis visualized. Abdominal aorta: No aneurysm visualized. Other findings: None. IMPRESSION: Mild cholelithiasis is noted without definite evidence of cholecystitis. 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 Two complex cysts are noted in the right kidney, the largest being 4.8 cm multi-septated cyst in midpole. Further evaluation with MRI is recommended to rule out potential neoplasm. Electronically Signed   By: Marijo Conception M.D.   On:  09/04/2019 13:39    Assessment & Plan:   There are no diagnoses linked to this encounter.   No orders of the defined types were placed in this encounter.    Follow-up: No follow-ups on file.  Walker Kehr, MD

## 2019-11-23 NOTE — Assessment & Plan Note (Signed)
BP Readings from Last 3 Encounters:  11/23/19 132/60  09/16/19 122/70  09/16/19 122/70

## 2019-11-23 NOTE — Assessment & Plan Note (Signed)
We can remove Info

## 2019-11-23 NOTE — Assessment & Plan Note (Signed)
Monitor labs 

## 2019-11-23 NOTE — Assessment & Plan Note (Signed)
ASA, Pravachol, Coreg No angina

## 2019-11-23 NOTE — Assessment & Plan Note (Addendum)
Metformin, Pravastatin, ASA Labs

## 2019-11-23 NOTE — Assessment & Plan Note (Signed)
No relapse 

## 2019-11-30 ENCOUNTER — Encounter: Payer: Self-pay | Admitting: Internal Medicine

## 2019-11-30 ENCOUNTER — Other Ambulatory Visit: Payer: Self-pay | Admitting: Internal Medicine

## 2019-11-30 ENCOUNTER — Ambulatory Visit (INDEPENDENT_AMBULATORY_CARE_PROVIDER_SITE_OTHER): Payer: Medicare Other | Admitting: Internal Medicine

## 2019-11-30 ENCOUNTER — Other Ambulatory Visit: Payer: Self-pay

## 2019-11-30 DIAGNOSIS — L723 Sebaceous cyst: Secondary | ICD-10-CM

## 2019-11-30 DIAGNOSIS — L720 Epidermal cyst: Secondary | ICD-10-CM | POA: Diagnosis not present

## 2019-11-30 NOTE — Progress Notes (Signed)
Procedure: Lesion #1 on R axilla cyst measuring 11x10 mm sq. Consent obtained.   Skin over lesion #1  was prepped with Betadine and alcohol  and anesthetized with 2 cc of 2% lidocaine and epinephrine, using a 25-gauge 1 inch needle. Excisional biopsy with a sterile #11 blade was carried out in the usual fashion. Dark material and cyst was excised. Wound was irrigated. Then it was closed with Dermaglue. Band-Aid was applied

## 2019-11-30 NOTE — Assessment & Plan Note (Signed)
Symptomatic, growing - will bx today

## 2019-12-01 ENCOUNTER — Other Ambulatory Visit: Payer: Self-pay | Admitting: Internal Medicine

## 2019-12-23 ENCOUNTER — Other Ambulatory Visit: Payer: Self-pay | Admitting: Internal Medicine

## 2020-03-01 ENCOUNTER — Other Ambulatory Visit: Payer: Self-pay | Admitting: Internal Medicine

## 2020-03-09 ENCOUNTER — Other Ambulatory Visit: Payer: Self-pay | Admitting: Internal Medicine

## 2020-03-24 ENCOUNTER — Other Ambulatory Visit: Payer: Self-pay

## 2020-03-24 ENCOUNTER — Ambulatory Visit: Payer: Medicare Other | Admitting: Internal Medicine

## 2020-03-24 ENCOUNTER — Encounter: Payer: Self-pay | Admitting: Internal Medicine

## 2020-03-24 ENCOUNTER — Ambulatory Visit (INDEPENDENT_AMBULATORY_CARE_PROVIDER_SITE_OTHER): Payer: Medicare Other | Admitting: Internal Medicine

## 2020-03-24 DIAGNOSIS — I5032 Chronic diastolic (congestive) heart failure: Secondary | ICD-10-CM | POA: Diagnosis not present

## 2020-03-24 DIAGNOSIS — F4321 Adjustment disorder with depressed mood: Secondary | ICD-10-CM | POA: Diagnosis not present

## 2020-03-24 DIAGNOSIS — G5601 Carpal tunnel syndrome, right upper limb: Secondary | ICD-10-CM

## 2020-03-24 DIAGNOSIS — E1151 Type 2 diabetes mellitus with diabetic peripheral angiopathy without gangrene: Secondary | ICD-10-CM | POA: Diagnosis not present

## 2020-03-24 DIAGNOSIS — G5600 Carpal tunnel syndrome, unspecified upper limb: Secondary | ICD-10-CM | POA: Insufficient documentation

## 2020-03-24 DIAGNOSIS — F411 Generalized anxiety disorder: Secondary | ICD-10-CM

## 2020-03-24 MED ORDER — AMLODIPINE BESYLATE 10 MG PO TABS
10.0000 mg | ORAL_TABLET | Freq: Every day | ORAL | 3 refills | Status: DC
Start: 2020-03-24 — End: 2020-05-11

## 2020-03-24 MED ORDER — METHYLPREDNISOLONE 4 MG PO TBPK: ORAL_TABLET | ORAL | 0 refills | Status: DC

## 2020-03-24 MED ORDER — AMLODIPINE BESYLATE 10 MG PO TABS
10.0000 mg | ORAL_TABLET | Freq: Every day | ORAL | 3 refills | Status: DC
Start: 2020-03-24 — End: 2020-03-24

## 2020-03-24 MED ORDER — AMLODIPINE BESYLATE 10 MG PO TABS
10.0000 mg | ORAL_TABLET | Freq: Every day | ORAL | 0 refills | Status: DC
Start: 2020-03-24 — End: 2020-03-28

## 2020-03-24 NOTE — Assessment & Plan Note (Addendum)
New R hand: R fingers #2 and 3 being numb and clumsy x wks. Hard to write, tie shoes... Ortho ref if not better Splint Medrol pack prescribed

## 2020-03-24 NOTE — Assessment & Plan Note (Signed)
Careful w/Medrol

## 2020-03-24 NOTE — Assessment & Plan Note (Addendum)
New.  Pt lost son, sister - Dec 2021 We talked about his loss.  He seems to be having good coping mechanisms.

## 2020-03-24 NOTE — Patient Instructions (Addendum)
Carpal Tunnel Syndrome  Carpal tunnel syndrome is a condition that causes pain in your hand and arm. The carpal tunnel is a narrow area that is on the palm side of your wrist. Repeated wrist motion or certain diseases may cause swelling in the tunnel. This swelling can pinch the main nerve in the wrist (median nerve). What are the causes? This condition may be caused by:  Repeated wrist motions.  Wrist injuries.  Arthritis.  A sac of fluid (cyst) or abnormal growth (tumor) in the carpal tunnel.  Fluid buildup during pregnancy. Sometimes the cause is not known. What increases the risk? The following factors may make you more likely to develop this condition:  Having a job in which you move your wrist in the same way many times. This includes jobs like being a Software engineer or a Scientist, water quality.  Being a woman.  Having other health conditions, such as: ? Diabetes. ? Obesity. ? A thyroid gland that is not active enough (hypothyroidism). ? Kidney failure. What are the signs or symptoms? Symptoms of this condition include:  A tingling feeling in your fingers.  Tingling or a loss of feeling (numbness) in your hand.  Pain in your entire arm. This pain may get worse when you bend your wrist and elbow for a long time.  Pain in your wrist that goes up your arm to your shoulder.  Pain that goes down into your palm or fingers.  A weak feeling in your hands. You may find it hard to grab and hold items. You may feel worse at night. How is this diagnosed? This condition is diagnosed with a medical history and physical exam. You may also have tests, such as:  Electromyogram (EMG). This test checks the signals that the nerves send to the muscles.  Nerve conduction study. This test checks how well signals pass through your nerves.  Imaging tests, such as X-rays, ultrasound, and MRI. These tests check for what might be the cause of your condition. How is this treated? This condition may be  treated with:  Lifestyle changes. You will be asked to stop or change the activity that caused your problem.  Doing exercise and activities that make bones and muscles stronger (physical therapy).  Learning how to use your hand again (occupational therapy).  Medicines for pain and swelling (inflammation). You may have injections in your wrist.  A wrist splint.  Surgery. Follow these instructions at home: If you have a splint:  Wear the splint as told by your doctor. Remove it only as told by your doctor.  Loosen the splint if your fingers: ? Tingle. ? Lose feeling (become numb). ? Turn cold and blue.  Keep the splint clean.  If the splint is not waterproof: ? Do not let it get wet. ? Cover it with a watertight covering when you take a bath or a shower. Managing pain, stiffness, and swelling   If told, put ice on the painful area: ? If you have a removable splint, remove it as told by your doctor. ? Put ice in a plastic bag. ? Place a towel between your skin and the bag. ? Leave the ice on for 20 minutes, 2-3 times per day. General instructions  Take over-the-counter and prescription medicines only as told by your doctor.  Rest your wrist from any activity that may cause pain. If needed, talk with your boss at work about changes that can help your wrist heal.  Do any exercises as told by your doctor,  physical therapist, or occupational therapist.  Keep all follow-up visits as told by your doctor. This is important. Contact a doctor if:  You have new symptoms.  Medicine does not help your pain.  Your symptoms get worse. Get help right away if:  You have very bad numbness or tingling in your wrist or hand. Summary  Carpal tunnel syndrome is a condition that causes pain in your hand and arm.  It is often caused by repeated wrist motions.  Lifestyle changes and medicines are used to treat this problem. Surgery may help in very bad cases.  Follow your doctor's  instructions about wearing a splint, resting your wrist, keeping follow-up visits, and calling for help. This information is not intended to replace advice given to you by your health care provider. Make sure you discuss any questions you have with your health care provider. Document Revised: 08/02/2017 Document Reviewed: 08/02/2017 Elsevier Patient Education  [place].    B-complex with Niacin 100 mg

## 2020-03-24 NOTE — Assessment & Plan Note (Signed)
Coreg, Hydralazine, Lasix Rx  Irbesartan Dr Burt Knack

## 2020-03-24 NOTE — Progress Notes (Signed)
Subjective:  Patient ID: Harold Pittman, male    DOB: 1940/05/12  Age: 79 y.o. MRN: [id number]  CC: Follow-up (4 month f/u- Pt states his index & middles finger on his (R) hand is numb)   HPI Harold Pittman presents for his R fingers #2 and 3 being numb and clumsy x wks. Hard to write, tie shoes... No other sx's No TIA, CVA sx's Pt just lost his son, sister - Dec 2021.  He is grieving   Outpatient Medications Prior to Visit  Medication Sig Dispense Refill  . aspirin 81 MG EC tablet Take 1 tablet (81 mg total) by mouth daily. 100 tablet 3  . Blood Glucose Monitoring Suppl (ONETOUCH VERIO FLEX SYSTEM) w/Device KIT Use to monitor blood sugar 1 kit 0  . carvedilol (COREG) 25 MG tablet TAKE 1 TABLET BY MOUTH  TWICE DAILY WITH A MEAL 180 tablet 2  . Cholecalciferol (VITAMIN D3) 50 MCG (2000 UT) capsule Take 1 capsule (2,000 Units total) by mouth daily. 100 capsule 3  . colchicine 0.6 MG tablet TAKE 1 TABLET BY MOUTH  TWICE DAILY AS NEEDED 180 tablet 3  . Cyanocobalamin (VITAMIN B-12) 1000 MCG SUBL Place 1 tablet (1,000 mcg total) under the tongue daily. 100 tablet 3  . cyclobenzaprine (FLEXERIL) 10 MG tablet Take 1 tablet (10 mg total) by mouth at bedtime. 90 tablet 3  . docusate sodium (COLACE) 100 MG capsule Take 1 capsule (100 mg total) by mouth 2 (two) times daily. 10 capsule 0  . famotidine (PEPCID) 40 MG tablet TAKE 1 TABLET BY MOUTH  DAILY 90 tablet 2  . ferrous sulfate 325 (65 FE) MG tablet Take 1 tablet (325 mg total) by mouth daily. 90 tablet 3  . furosemide (LASIX) 20 MG tablet Take 2 tablets (40 mg total) by mouth every morning. 180 tablet 3  . glucose blood (ONETOUCH VERIO) test strip CHECK BLOOD SUGAR 1 TO 2  TIMES DAILY AS DIRECTED 200 strip 3  . hydrALAZINE (APRESOLINE) 50 MG tablet Take 1 tablet (50 mg total) by mouth 3 (three) times daily. 90 tablet 3  . irbesartan (AVAPRO) 150 MG tablet Take 1 tablet (150 mg total) by mouth daily. 90 tablet 3  . Lancets (ONETOUCH DELICA  PLUS [id number]) MISC USE AS DIRECTED 200 each 3  . metFORMIN (GLUCOPHAGE-XR) 750 MG 24 hr tablet TAKE 1 TABLET BY MOUTH  DAILY WITH BREAKFAST 90 tablet 2  . omeprazole (PRILOSEC) 40 MG capsule TAKE 1 CAPSULE BY MOUTH  DAILY 90 capsule 3  . ondansetron (ZOFRAN) 4 MG tablet Take 1 tablet (4 mg total) by mouth every 8 (eight) hours as needed for nausea or vomiting. 21 tablet 1  . pravastatin (PRAVACHOL) 80 MG tablet TAKE 1 TABLET BY MOUTH AT  BEDTIME 90 tablet 1  . tadalafil (ADCIRCA/CIALIS) 20 MG tablet Take 20 mg by mouth as needed for erectile dysfunction.    . torsemide (DEMADEX) 100 MG tablet Take 1 tablet (100 mg total) by mouth daily. 90 tablet 3  . amLODipine (NORVASC) 10 MG tablet Take 1 tablet (10 mg total) by mouth daily. 90 tablet 3  . chlorhexidine (HIBICLENS) 4 % external liquid Apply topically daily as needed. (Patient not taking: Reported on 03/24/2020) 236 mL 1   No facility-administered medications prior to visit.    ROS: Review of Systems  Constitutional: Negative for appetite change, fatigue and unexpected weight change.  HENT: Negative for congestion, nosebleeds, sneezing, sore throat and trouble swallowing.  Eyes: Negative for itching and visual disturbance.  Respiratory: Negative for cough.   Cardiovascular: Negative for chest pain, palpitations and leg swelling.  Gastrointestinal: Negative for abdominal distention, blood in stool, diarrhea and nausea.  Genitourinary: Negative for frequency and hematuria.  Musculoskeletal: Negative for back pain, gait problem, joint swelling and neck pain.  Skin: Negative for rash.  Neurological: Positive for weakness and numbness. Negative for dizziness, tremors and speech difficulty.  Psychiatric/Behavioral: Negative for agitation, dysphoric mood and sleep disturbance. The patient is not nervous/anxious.     Objective:  BP (!) 142/70 (BP Location: Left Arm)   Pulse 67   Temp 98.6 F (37 C) (Oral)   Wt 180 lb 3.2 oz (81.7 kg)    SpO2 97%   BMI 25.13 kg/m   BP Readings from Last 3 Encounters:  03/24/20 (!) 142/70  11/30/19 110/62  11/23/19 132/60    Wt Readings from Last 3 Encounters:  03/24/20 180 lb 3.2 oz (81.7 kg)  11/30/19 179 lb 9.6 oz (81.5 kg)  11/23/19 183 lb (83 kg)    Physical Exam Constitutional:      General: He is not in acute distress.    Appearance: He is well-developed.     Comments: NAD  HENT:     Mouth/Throat:     Mouth: Oropharynx is clear and moist.  Eyes:     Conjunctiva/sclera: Conjunctivae normal.     Pupils: Pupils are equal, round, and reactive to light.  Neck:     Thyroid: No thyromegaly.     Vascular: No JVD.  Cardiovascular:     Rate and Rhythm: Normal rate and regular rhythm.     Pulses: Intact distal pulses.     Heart sounds: Normal heart sounds. No murmur heard. No friction rub. No gallop.   Pulmonary:     Effort: Pulmonary effort is normal. No respiratory distress.     Breath sounds: Normal breath sounds. No wheezing or rales.  Chest:     Chest wall: No tenderness.  Abdominal:     General: Bowel sounds are normal. There is no distension.     Palpations: Abdomen is soft. There is no mass.     Tenderness: There is no abdominal tenderness. There is no guarding or rebound.  Musculoskeletal:        General: No tenderness or edema. Normal range of motion.     Cervical back: Normal range of motion.  Lymphadenopathy:     Cervical: No cervical adenopathy.  Skin:    General: Skin is warm and dry.     Findings: No rash.  Neurological:     Mental Status: He is alert and oriented to person, place, and time.     Cranial Nerves: No cranial nerve deficit.     Motor: No abnormal muscle tone.     Coordination: He displays a negative Romberg sign. Coordination normal.     Gait: Gait normal.     Deep Tendon Reflexes: Reflexes are normal and symmetric.  Psychiatric:        Mood and Affect: Mood and affect normal.        Behavior: Behavior normal.        Thought  Content: Thought content normal.        Judgment: Judgment normal.    Fingers - WNL CTS signs (+/-) on the R  Lab Results  Component Value Date   WBC 5.5 11/18/2019   HGB 11.3 (L) 11/18/2019   HCT 34.1 (L) 11/18/2019  PLT 210 11/18/2019   GLUCOSE 146 (H) 11/18/2019   CHOL 120 07/21/2019   TRIG 68.0 07/21/2019   HDL 31.70 (L) 07/21/2019   LDLCALC 75 07/21/2019   ALT 25 11/18/2019   AST 25 11/18/2019   NA 146 11/18/2019   K 3.8 11/18/2019   CL 109 11/18/2019   CREATININE 1.76 (H) 11/18/2019   BUN 19 11/18/2019   CO2 28 11/18/2019   TSH 0.89 11/18/2019   PSA 8.92 (H) 04/22/2019   INR 1.35 07/23/2015   HGBA1C 5.9 (H) 11/18/2019   MICROALBUR 38.7 (H) 11/24/2013    US Abdomen Complete  Result Date: 09/04/2019 CLINICAL DATA:  Nausea, weight loss. EXAM: ABDOMEN ULTRASOUND COMPLETE COMPARISON:  October 31, 2005. FINDINGS: Gallbladder: Mild cholelithiasis is noted. No gallbladder wall thickening or pericholecystic fluid is noted. The presence or absence of sonographic Murphy's sign was not reported by the technologist. Common bile duct: Diameter: 3 mm which is within normal limits. Liver: 6 mm echogenic focus is noted in right hepatic lobe most consistent with hemangioma. Multiple hepatic cysts are noted, the largest measuring 1.9 cm in right hepatic lobe. Within normal limits in parenchymal echogenicity. Portal vein is patent on color Doppler imaging with normal direction of blood flow towards the liver. IVC: Not visualized due to overlying bowel gas. Pancreas: Visualized portion is unremarkable. Spleen: Size and appearance within normal limits. Right Kidney: Length: 12.3 cm. 3.7 cm simple cyst is seen in lower pole. 3.6 cm septated cyst is seen in upper pole. 4.8 cm multi-septated cyst is noted in midpole. Echogenicity within normal limits. No hydronephrosis visualized. Left Kidney: Length: 10.5 cm. 2.6 cm simple cyst is seen in lower pole. Echogenicity within normal limits. No mass or  hydronephrosis visualized. Abdominal aorta: No aneurysm visualized. Other findings: None. IMPRESSION: Mild cholelithiasis is noted without definite evidence of cholecystitis. 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 Two complex cysts are noted in the right kidney, the largest being 4.8 cm multi-septated cyst in midpole. Further evaluation with MRI is recommended to rule out potential neoplasm. Electronically Signed   By: Marijo Conception M.D.   On: 09/04/2019 13:39    Assessment & Plan:     Meds ordered this encounter  Medications  . DISCONTD: amLODipine (NORVASC) 10 MG tablet    Sig: Take 1 tablet (10 mg total) by mouth daily.    Dispense:  90 tablet    Refill:  3  . amLODipine (NORVASC) 10 MG tablet    Sig: Take 1 tablet (10 mg total) by mouth daily. Enough until pt receive mail order    Dispense:  30 tablet    Refill:  0    Enough until pt receive mail order     Follow-up: No follow-ups on file.  Walker Kehr, MD

## 2020-03-25 ENCOUNTER — Telehealth: Payer: Self-pay | Admitting: Internal Medicine

## 2020-03-25 NOTE — Telephone Encounter (Signed)
    Patient states pharmacy  Does not have order for short supply of amLODipine (NORVASC) 10 MG tablet  Please re-send

## 2020-03-27 NOTE — Assessment & Plan Note (Signed)
Worse.  We talked about his loss.  He seems to be having good coping mechanisms.

## 2020-03-28 MED ORDER — AMLODIPINE BESYLATE 10 MG PO TABS
10.0000 mg | ORAL_TABLET | Freq: Every day | ORAL | 0 refills | Status: DC
Start: 2020-03-28 — End: 2020-03-30

## 2020-03-28 NOTE — Telephone Encounter (Signed)
Per chart we received confirmation rx was received " E-Prescribing Status: Receipt confirmed by pharmacy (03/24/2020 10:23 AM EST)". Resent to [facility].Marland KitchenChryl Heck

## 2020-03-30 ENCOUNTER — Other Ambulatory Visit: Payer: Self-pay | Admitting: Internal Medicine

## 2020-04-04 ENCOUNTER — Ambulatory Visit: Payer: Medicare Other | Admitting: Internal Medicine

## 2020-05-10 ENCOUNTER — Other Ambulatory Visit: Payer: Self-pay

## 2020-05-11 ENCOUNTER — Ambulatory Visit (INDEPENDENT_AMBULATORY_CARE_PROVIDER_SITE_OTHER): Payer: Medicare Other | Admitting: Internal Medicine

## 2020-05-11 ENCOUNTER — Encounter: Payer: Self-pay | Admitting: Internal Medicine

## 2020-05-11 VITALS — BP 142/76 | HR 73 | Temp 98.3°F | Ht 71.0 in | Wt 183.2 lb

## 2020-05-11 DIAGNOSIS — I5032 Chronic diastolic (congestive) heart failure: Secondary | ICD-10-CM

## 2020-05-11 DIAGNOSIS — G5601 Carpal tunnel syndrome, right upper limb: Secondary | ICD-10-CM

## 2020-05-11 DIAGNOSIS — E1151 Type 2 diabetes mellitus with diabetic peripheral angiopathy without gangrene: Secondary | ICD-10-CM

## 2020-05-11 DIAGNOSIS — R269 Unspecified abnormalities of gait and mobility: Secondary | ICD-10-CM

## 2020-05-11 DIAGNOSIS — F4321 Adjustment disorder with depressed mood: Secondary | ICD-10-CM

## 2020-05-11 LAB — BASIC METABOLIC PANEL
BUN: 24 mg/dL — ABNORMAL HIGH (ref 6–23)
CO2: 32 mEq/L (ref 19–32)
Calcium: 9.6 mg/dL (ref 8.4–10.5)
Chloride: 105 mEq/L (ref 96–112)
Creatinine, Ser: 1.85 mg/dL — ABNORMAL HIGH (ref 0.40–1.50)
GFR: 34.32 mL/min — ABNORMAL LOW (ref >60.00)
Glucose, Bld: 206 mg/dL — ABNORMAL HIGH (ref 70–99)
Potassium: 3.8 mEq/L (ref 3.5–5.1)
Sodium: 143 mEq/L (ref 135–145)

## 2020-05-11 LAB — HEMOGLOBIN A1C: Hgb A1c MFr Bld: 6.4 % (ref 4.6–6.5)

## 2020-05-11 MED ORDER — FAMOTIDINE 40 MG PO TABS: 40.0000 mg | ORAL_TABLET | Freq: Every day | ORAL | 3 refills | Status: DC

## 2020-05-11 NOTE — Assessment & Plan Note (Signed)
Irbesartan Dr Burt Knack

## 2020-05-11 NOTE — Assessment & Plan Note (Signed)
Use a cane. Try trekking poles

## 2020-05-11 NOTE — Assessment & Plan Note (Signed)
Better Will watch Use a splint

## 2020-05-11 NOTE — Progress Notes (Signed)
Subjective:  Patient ID: [name], male    DOB: 03/04/1941  Age: 80 y.o. MRN: [id number]  CC: Follow-up (6 WK F/U- Req refill on Pepcid 40 mg)   HPI Marsh Dolly Sweeden presents for CTS R hand - better F/u on grieving  Outpatient Medications Prior to Visit  Medication Sig Dispense Refill  . amLODipine (NORVASC) 10 MG tablet TAKE 1 TABLET BY MOUTH DAILY 90 tablet 1  . aspirin 81 MG EC tablet Take 1 tablet (81 mg total) by mouth daily. 100 tablet 3  . Blood Glucose Monitoring Suppl (ONETOUCH VERIO FLEX SYSTEM) w/Device KIT Use to monitor blood sugar 1 kit 0  . carvedilol (COREG) 25 MG tablet TAKE 1 TABLET BY MOUTH  TWICE DAILY WITH A MEAL 180 tablet 2  . Cholecalciferol (VITAMIN D3) 50 MCG (2000 UT) capsule Take 1 capsule (2,000 Units total) by mouth daily. 100 capsule 3  . colchicine 0.6 MG tablet TAKE 1 TABLET BY MOUTH  TWICE DAILY AS NEEDED 180 tablet 3  . Cyanocobalamin (VITAMIN B-12) 1000 MCG SUBL Place 1 tablet (1,000 mcg total) under the tongue daily. 100 tablet 3  . cyclobenzaprine (FLEXERIL) 10 MG tablet Take 1 tablet (10 mg total) by mouth at bedtime. 90 tablet 3  . docusate sodium (COLACE) 100 MG capsule Take 1 capsule (100 mg total) by mouth 2 (two) times daily. 10 capsule 0  . ferrous sulfate 325 (65 FE) MG tablet Take 1 tablet (325 mg total) by mouth daily. 90 tablet 3  . furosemide (LASIX) 20 MG tablet Take 2 tablets (40 mg total) by mouth every morning. 180 tablet 3  . glucose blood (ONETOUCH VERIO) test strip CHECK BLOOD SUGAR 1 TO 2  TIMES DAILY AS DIRECTED 200 strip 3  . hydrALAZINE (APRESOLINE) 50 MG tablet Take 1 tablet (50 mg total) by mouth 3 (three) times daily. 90 tablet 3  . irbesartan (AVAPRO) 150 MG tablet Take 1 tablet (150 mg total) by mouth daily. 90 tablet 3  . Lancets (ONETOUCH DELICA PLUS [id number]) MISC USE AS DIRECTED 200 each 3  . metFORMIN (GLUCOPHAGE-XR) 750 MG 24 hr tablet TAKE 1 TABLET BY MOUTH  DAILY WITH BREAKFAST 90 tablet 2  . omeprazole  (PRILOSEC) 40 MG capsule TAKE 1 CAPSULE BY MOUTH  DAILY 90 capsule 3  . ondansetron (ZOFRAN) 4 MG tablet Take 1 tablet (4 mg total) by mouth every 8 (eight) hours as needed for nausea or vomiting. 21 tablet 1  . pravastatin (PRAVACHOL) 80 MG tablet TAKE 1 TABLET BY MOUTH AT  BEDTIME 90 tablet 1  . tadalafil (ADCIRCA/CIALIS) 20 MG tablet Take 20 mg by mouth as needed for erectile dysfunction.    . torsemide (DEMADEX) 100 MG tablet Take 1 tablet (100 mg total) by mouth daily. 90 tablet 3  . amLODipine (NORVASC) 10 MG tablet Take 1 tablet (10 mg total) by mouth daily. 90 tablet 3  . famotidine (PEPCID) 40 MG tablet TAKE 1 TABLET BY MOUTH  DAILY 90 tablet 2  . methylPREDNISolone (MEDROL DOSEPAK) 4 MG TBPK tablet As directed (Patient not taking: Reported on 05/11/2020) 21 tablet 0   No facility-administered medications prior to visit.    ROS: Review of Systems  Constitutional: Negative for appetite change, fatigue and unexpected weight change.  HENT: Negative for congestion, nosebleeds, sneezing, sore throat and trouble swallowing.   Eyes: Negative for itching and visual disturbance.  Respiratory: Negative for cough.   Cardiovascular: Negative for chest pain, palpitations and leg swelling.  Gastrointestinal: Negative for abdominal distention, blood in stool, diarrhea and nausea.  Genitourinary: Negative for frequency and hematuria.  Musculoskeletal: Negative for back pain, gait problem, joint swelling and neck pain.  Skin: Negative for rash.  Neurological: Negative for dizziness, tremors, speech difficulty and weakness.  Psychiatric/Behavioral: Negative for agitation, dysphoric mood and sleep disturbance. The patient is not nervous/anxious.     Objective:  BP (!) 142/76 (BP Location: Left Arm)   Pulse 73   Temp 98.3 F (36.8 C) (Oral)   Ht 5' 11"  (1.803 m)   Wt 183 lb 3.2 oz (83.1 kg)   SpO2 95%   BMI 25.55 kg/m   BP Readings from Last 3 Encounters:  05/11/20 (!) 142/76  03/24/20 (!)  142/70  11/30/19 110/62    Wt Readings from Last 3 Encounters:  05/11/20 183 lb 3.2 oz (83.1 kg)  03/24/20 180 lb 3.2 oz (81.7 kg)  11/30/19 179 lb 9.6 oz (81.5 kg)    Physical Exam Constitutional:      General: He is not in acute distress.    Appearance: He is well-developed.     Comments: NAD  HENT:     Mouth/Throat:     Mouth: Oropharynx is clear and moist.  Eyes:     Conjunctiva/sclera: Conjunctivae normal.     Pupils: Pupils are equal, round, and reactive to light.  Neck:     Thyroid: No thyromegaly.     Vascular: No JVD.  Cardiovascular:     Rate and Rhythm: Normal rate and regular rhythm.     Pulses: Intact distal pulses.     Heart sounds: Normal heart sounds. No murmur heard. No friction rub. No gallop.   Pulmonary:     Effort: Pulmonary effort is normal. No respiratory distress.     Breath sounds: Normal breath sounds. No wheezing or rales.  Chest:     Chest wall: No tenderness.  Abdominal:     General: Bowel sounds are normal. There is no distension.     Palpations: Abdomen is soft. There is no mass.     Tenderness: There is no abdominal tenderness. There is no guarding or rebound.  Musculoskeletal:        General: No tenderness or edema. Normal range of motion.     Cervical back: Normal range of motion.  Lymphadenopathy:     Cervical: No cervical adenopathy.  Skin:    General: Skin is warm and dry.     Findings: No rash.  Neurological:     Mental Status: He is alert and oriented to person, place, and time.     Cranial Nerves: No cranial nerve deficit.     Motor: No abnormal muscle tone.     Coordination: He displays a negative Romberg sign. Coordination normal.     Gait: Gait normal.     Deep Tendon Reflexes: Reflexes are normal and symmetric.  Psychiatric:        Mood and Affect: Mood and affect normal.        Behavior: Behavior normal.        Thought Content: Thought content normal.        Judgment: Judgment normal.     Lab Results  Component  Value Date   WBC 5.5 11/18/2019   HGB 11.3 (L) 11/18/2019   HCT 34.1 (L) 11/18/2019   PLT 210 11/18/2019   GLUCOSE 146 (H) 11/18/2019   CHOL 120 07/21/2019   TRIG 68.0 07/21/2019   HDL 31.70 (L) 07/21/2019  LDLCALC 75 07/21/2019   ALT 25 11/18/2019   AST 25 11/18/2019   NA 146 11/18/2019   K 3.8 11/18/2019   CL 109 11/18/2019   CREATININE 1.76 (H) 11/18/2019   BUN 19 11/18/2019   CO2 28 11/18/2019   TSH 0.89 11/18/2019   PSA 8.92 (H) 04/22/2019   INR 1.35 07/23/2015   HGBA1C 5.9 (H) 11/18/2019   MICROALBUR 38.7 (H) 11/24/2013    US Abdomen Complete  Result Date: 09/04/2019 CLINICAL DATA:  Nausea, weight loss. EXAM: ABDOMEN ULTRASOUND COMPLETE COMPARISON:  October 31, 2005. FINDINGS: Gallbladder: Mild cholelithiasis is noted. No gallbladder wall thickening or pericholecystic fluid is noted. The presence or absence of sonographic Murphy's sign was not reported by the technologist. Common bile duct: Diameter: 3 mm which is within normal limits. Liver: 6 mm echogenic focus is noted in right hepatic lobe most consistent with hemangioma. Multiple hepatic cysts are noted, the largest measuring 1.9 cm in right hepatic lobe. Within normal limits in parenchymal echogenicity. Portal vein is patent on color Doppler imaging with normal direction of blood flow towards the liver. IVC: Not visualized due to overlying bowel gas. Pancreas: Visualized portion is unremarkable. Spleen: Size and appearance within normal limits. Right Kidney: Length: 12.3 cm. 3.7 cm simple cyst is seen in lower pole. 3.6 cm septated cyst is seen in upper pole. 4.8 cm multi-septated cyst is noted in midpole. Echogenicity within normal limits. No hydronephrosis visualized. Left Kidney: Length: 10.5 cm. 2.6 cm simple cyst is seen in lower pole. Echogenicity within normal limits. No mass or hydronephrosis visualized. Abdominal aorta: No aneurysm visualized. Other findings: None. IMPRESSION: Mild cholelithiasis is noted without  definite evidence of cholecystitis. 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 Two complex cysts are noted in the right kidney, the largest being 4.8 cm multi-septated cyst in midpole. Further evaluation with MRI is recommended to rule out potential neoplasm. Electronically Signed   By: Marijo Conception M.D.   On: 09/04/2019 13:39    Assessment & Plan:   There are no diagnoses linked to this encounter.   Meds ordered this encounter  Medications  . famotidine (PEPCID) 40 MG tablet    Sig: Take 1 tablet (40 mg total) by mouth daily.    Dispense:  90 tablet    Refill:  3    Requesting 1 year supply     Follow-up: No follow-ups on file.  Walker Kehr, MD

## 2020-05-11 NOTE — Assessment & Plan Note (Signed)
Coping ok 

## 2020-05-12 ENCOUNTER — Other Ambulatory Visit: Payer: Self-pay | Admitting: Internal Medicine

## 2020-05-12 MED ORDER — TORSEMIDE 100 MG PO TABS: 50.0000 mg | ORAL_TABLET | Freq: Every day | ORAL | 3 refills | Status: DC

## 2020-05-13 ENCOUNTER — Other Ambulatory Visit: Payer: Self-pay | Admitting: *Deleted

## 2020-05-13 MED ORDER — TORSEMIDE 100 MG PO TABS: 50.0000 mg | ORAL_TABLET | Freq: Every day | ORAL | 3 refills | Status: DC

## 2020-05-18 ENCOUNTER — Other Ambulatory Visit: Payer: Self-pay | Admitting: Internal Medicine

## 2020-06-07 ENCOUNTER — Ambulatory Visit (INDEPENDENT_AMBULATORY_CARE_PROVIDER_SITE_OTHER): Payer: Medicare Other | Admitting: Internal Medicine

## 2020-06-07 ENCOUNTER — Other Ambulatory Visit: Payer: Self-pay

## 2020-06-07 ENCOUNTER — Encounter: Payer: Self-pay | Admitting: Internal Medicine

## 2020-06-07 DIAGNOSIS — G5601 Carpal tunnel syndrome, right upper limb: Secondary | ICD-10-CM | POA: Diagnosis not present

## 2020-06-07 MED ORDER — METHYLPREDNISOLONE 4 MG PO TBPK: ORAL_TABLET | ORAL | 0 refills | Status: DC

## 2020-06-07 MED ORDER — COLCHICINE 0.6 MG PO TABS: 0.6000 mg | ORAL_TABLET | Freq: Two times a day (BID) | ORAL | 3 refills | Status: DC | PRN

## 2020-06-07 MED ORDER — AMLODIPINE BESYLATE 10 MG PO TABS: 10.0000 mg | ORAL_TABLET | Freq: Every day | ORAL | 3 refills | Status: DC

## 2020-06-07 NOTE — Assessment & Plan Note (Signed)
CTS vs other: weakness, tingling - fingers #2-3, not better Try Medrol pack again Ortho ref

## 2020-06-07 NOTE — Progress Notes (Signed)
Subjective:  Patient ID: Harold Pittman, male    DOB: Apr 15, 1940  Age: 80 y.o. MRN: [id number]  CC: Hand Pain (Pt states in both hands, but his (L) index finger can't bend)   HPI Harold Pittman presents for R hand weakness, tingling - fingers #2-3, not better  Outpatient Medications Prior to Visit  Medication Sig Dispense Refill  . amLODipine (NORVASC) 10 MG tablet TAKE 1 TABLET BY MOUTH DAILY 90 tablet 1  . aspirin 81 MG EC tablet Take 1 tablet (81 mg total) by mouth daily. 100 tablet 3  . Blood Glucose Monitoring Suppl (ONETOUCH VERIO FLEX SYSTEM) w/Device KIT Use to monitor blood sugar 1 kit 0  . carvedilol (COREG) 25 MG tablet TAKE 1 TABLET BY MOUTH  TWICE DAILY WITH A MEAL 180 tablet 2  . Cholecalciferol (VITAMIN D3) 50 MCG (2000 UT) capsule Take 1 capsule (2,000 Units total) by mouth daily. 100 capsule 3  . colchicine 0.6 MG tablet TAKE 1 TABLET BY MOUTH  TWICE DAILY AS NEEDED 180 tablet 3  . Cyanocobalamin (VITAMIN B-12) 1000 MCG SUBL Place 1 tablet (1,000 mcg total) under the tongue daily. 100 tablet 3  . cyclobenzaprine (FLEXERIL) 10 MG tablet Take 1 tablet (10 mg total) by mouth at bedtime. 90 tablet 3  . docusate sodium (COLACE) 100 MG capsule Take 1 capsule (100 mg total) by mouth 2 (two) times daily. 10 capsule 0  . famotidine (PEPCID) 40 MG tablet Take 1 tablet (40 mg total) by mouth daily. 90 tablet 3  . furosemide (LASIX) 20 MG tablet TAKE 2 TABLETS BY MOUTH IN  THE MORNING 180 tablet 3  . glucose blood (ONETOUCH VERIO) test strip CHECK BLOOD SUGAR 1 TO 2  TIMES DAILY AS DIRECTED 200 strip 3  . hydrALAZINE (APRESOLINE) 50 MG tablet Take 1 tablet (50 mg total) by mouth 3 (three) times daily. 90 tablet 3  . irbesartan (AVAPRO) 150 MG tablet TAKE 1 TABLET BY MOUTH  DAILY 90 tablet 3  . Lancets (ONETOUCH DELICA PLUS CVELFY10F) MISC USE AS DIRECTED 200 each 3  . metFORMIN (GLUCOPHAGE-XR) 750 MG 24 hr tablet TAKE 1 TABLET BY MOUTH  DAILY WITH BREAKFAST 90 tablet 2  .  omeprazole (PRILOSEC) 40 MG capsule TAKE 1 CAPSULE BY MOUTH  DAILY 90 capsule 3  . ondansetron (ZOFRAN) 4 MG tablet Take 1 tablet (4 mg total) by mouth every 8 (eight) hours as needed for nausea or vomiting. 21 tablet 1  . pravastatin (PRAVACHOL) 80 MG tablet TAKE 1 TABLET BY MOUTH AT  BEDTIME 90 tablet 1  . tadalafil (ADCIRCA/CIALIS) 20 MG tablet Take 20 mg by mouth as needed for erectile dysfunction.    . torsemide (DEMADEX) 100 MG tablet Take 0.5 tablets (50 mg total) by mouth daily. 90 tablet 3  . ferrous sulfate 325 (65 FE) MG tablet Take 1 tablet (325 mg total) by mouth daily. 90 tablet 3   No facility-administered medications prior to visit.    ROS: Review of Systems  Constitutional: Positive for fatigue. Negative for appetite change and unexpected weight change.  HENT: Negative for congestion, nosebleeds, sneezing, sore throat and trouble swallowing.   Eyes: Negative for itching and visual disturbance.  Respiratory: Negative for cough.   Cardiovascular: Negative for chest pain, palpitations and leg swelling.  Gastrointestinal: Negative for abdominal distention, blood in stool, diarrhea and nausea.  Genitourinary: Negative for frequency and hematuria.  Musculoskeletal: Negative for back pain, gait problem, joint swelling and neck pain.  Skin: Negative for rash.  Neurological: Positive for weakness. Negative for dizziness, tremors and speech difficulty.  Psychiatric/Behavioral: Negative for agitation, dysphoric mood, sleep disturbance and suicidal ideas. The patient is not nervous/anxious.     Objective:  BP 122/70 (BP Location: Left Arm)   Pulse 66   Temp 98 F (36.7 C) (Oral)   Wt 181 lb 12.8 oz (82.5 kg)   SpO2 97%   BMI 25.36 kg/m   BP Readings from Last 3 Encounters:  06/07/20 122/70  05/11/20 (!) 142/76  03/24/20 (!) 142/70    Wt Readings from Last 3 Encounters:  06/07/20 181 lb 12.8 oz (82.5 kg)  05/11/20 183 lb 3.2 oz (83.1 kg)  03/24/20 180 lb 3.2 oz (81.7  kg)    Physical Exam Constitutional:      General: He is not in acute distress.    Appearance: He is well-developed.     Comments: NAD  HENT:     Mouth/Throat:     Mouth: Oropharynx is clear and moist.  Eyes:     Conjunctiva/sclera: Conjunctivae normal.     Pupils: Pupils are equal, round, and reactive to light.  Neck:     Thyroid: No thyromegaly.     Vascular: No JVD.  Cardiovascular:     Rate and Rhythm: Normal rate and regular rhythm.     Pulses: Intact distal pulses.     Heart sounds: Normal heart sounds. No murmur heard. No friction rub. No gallop.   Pulmonary:     Effort: Pulmonary effort is normal. No respiratory distress.     Breath sounds: Normal breath sounds. No wheezing or rales.  Chest:     Chest wall: No tenderness.  Abdominal:     General: Bowel sounds are normal. There is no distension.     Palpations: Abdomen is soft. There is no mass.     Tenderness: There is no abdominal tenderness. There is no guarding or rebound.  Musculoskeletal:        General: No tenderness or edema. Normal range of motion.     Cervical back: Normal range of motion.  Lymphadenopathy:     Cervical: No cervical adenopathy.  Skin:    General: Skin is warm and dry.     Findings: No rash.  Neurological:     Mental Status: He is alert and oriented to person, place, and time.     Cranial Nerves: No cranial nerve deficit.     Motor: Weakness present. No abnormal muscle tone.     Coordination: He displays a negative Romberg sign. Coordination abnormal.     Gait: Gait abnormal.     Deep Tendon Reflexes: Reflexes are normal and symmetric.  Psychiatric:        Mood and Affect: Mood and affect normal.        Behavior: Behavior normal.        Thought Content: Thought content normal.        Judgment: Judgment normal.    R hand weakness Walking w/a cane  Lab Results  Component Value Date   WBC 5.5 11/18/2019   HGB 11.3 (L) 11/18/2019   HCT 34.1 (L) 11/18/2019   PLT 210 11/18/2019    GLUCOSE 206 (H) 05/11/2020   CHOL 120 07/21/2019   TRIG 68.0 07/21/2019   HDL 31.70 (L) 07/21/2019   LDLCALC 75 07/21/2019   ALT 25 11/18/2019   AST 25 11/18/2019   NA 143 05/11/2020   K 3.8 05/11/2020   CL 105 05/11/2020  CREATININE 1.85 (H) 05/11/2020   BUN 24 (H) 05/11/2020   CO2 32 05/11/2020   TSH 0.89 11/18/2019   PSA 8.92 (H) 04/22/2019   INR 1.35 07/23/2015   HGBA1C 6.4 05/11/2020   MICROALBUR 38.7 (H) 11/24/2013    US Abdomen Complete  Result Date: 09/04/2019 CLINICAL DATA:  Nausea, weight loss. EXAM: ABDOMEN ULTRASOUND COMPLETE COMPARISON:  October 31, 2005. FINDINGS: Gallbladder: Mild cholelithiasis is noted. No gallbladder wall thickening or pericholecystic fluid is noted. The presence or absence of sonographic Murphy's sign was not reported by the technologist. Common bile duct: Diameter: 3 mm which is within normal limits. Liver: 6 mm echogenic focus is noted in right hepatic lobe most consistent with hemangioma. Multiple hepatic cysts are noted, the largest measuring 1.9 cm in right hepatic lobe. Within normal limits in parenchymal echogenicity. Portal vein is patent on color Doppler imaging with normal direction of blood flow towards the liver. IVC: Not visualized due to overlying bowel gas. Pancreas: Visualized portion is unremarkable. Spleen: Size and appearance within normal limits. Right Kidney: Length: 12.3 cm. 3.7 cm simple cyst is seen in lower pole. 3.6 cm septated cyst is seen in upper pole. 4.8 cm multi-septated cyst is noted in midpole. Echogenicity within normal limits. No hydronephrosis visualized. Left Kidney: Length: 10.5 cm. 2.6 cm simple cyst is seen in lower pole. Echogenicity within normal limits. No mass or hydronephrosis visualized. Abdominal aorta: No aneurysm visualized. Other findings: None. IMPRESSION: Mild cholelithiasis is noted without definite evidence of cholecystitis. 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 Two complex cysts are noted in the right kidney,  the largest being 4.8 cm multi-septated cyst in midpole. Further evaluation with MRI is recommended to rule out potential neoplasm. Electronically Signed   By: Marijo Conception M.D.   On: 09/04/2019 13:39    Assessment & Plan:    Walker Kehr, MD

## 2020-06-07 NOTE — Patient Instructions (Signed)
Therapist, music at [facility] [address] [place],  [place]  [address] Get Driving Directions Main: [phone]

## 2020-06-12 ENCOUNTER — Encounter: Payer: Self-pay | Admitting: Internal Medicine

## 2020-06-16 DIAGNOSIS — G5602 Carpal tunnel syndrome, left upper limb: Secondary | ICD-10-CM | POA: Diagnosis not present

## 2020-06-16 DIAGNOSIS — G5601 Carpal tunnel syndrome, right upper limb: Secondary | ICD-10-CM | POA: Diagnosis not present

## 2020-06-17 ENCOUNTER — Other Ambulatory Visit: Payer: Self-pay | Admitting: Family

## 2020-06-17 ENCOUNTER — Telehealth: Payer: Self-pay | Admitting: Internal Medicine

## 2020-06-17 MED ORDER — COLCHICINE 0.6 MG PO TABS: 0.6000 mg | ORAL_TABLET | Freq: Two times a day (BID) | ORAL | 0 refills | Status: DC | PRN

## 2020-06-17 NOTE — Telephone Encounter (Signed)
   Patient calling to request pain medication for got flare up in feet.

## 2020-06-17 NOTE — Telephone Encounter (Signed)
Patient's daughter [name] calling, patient has a gout flare up. She is unsure if patient knows what medication  To take.  Please call the patient

## 2020-06-17 NOTE — Telephone Encounter (Signed)
He has colchicine on his medication list that would be used for gout flares and Dr. Alain Marion just filled earlier this month. Is he taking this already and needs pain medication or was the family trying to clarify what he is supposed to take?  Messages were confusing-

## 2020-06-17 NOTE — Telephone Encounter (Signed)
Medication was refilled 06/07/20, however, it was filled through [facility] & pt has not received it.  He states he can hardly walk & has been using crutches.  He would like to know if a short supply can be sent to the [facility] on [facility] while he is waiting for the mail order.

## 2020-06-27 ENCOUNTER — Other Ambulatory Visit: Payer: Self-pay | Admitting: Internal Medicine

## 2020-06-28 MED ORDER — ONETOUCH DELICA PLUS LANCET30G MISC: 3 refills | Status: DC

## 2020-06-28 MED ORDER — ONETOUCH VERIO VI STRP: ORAL_STRIP | 3 refills | Status: DC

## 2020-07-06 DIAGNOSIS — G5602 Carpal tunnel syndrome, left upper limb: Secondary | ICD-10-CM | POA: Diagnosis not present

## 2020-07-06 DIAGNOSIS — G5601 Carpal tunnel syndrome, right upper limb: Secondary | ICD-10-CM | POA: Diagnosis not present

## 2020-07-12 DIAGNOSIS — G5601 Carpal tunnel syndrome, right upper limb: Secondary | ICD-10-CM | POA: Diagnosis not present

## 2020-07-12 DIAGNOSIS — G5602 Carpal tunnel syndrome, left upper limb: Secondary | ICD-10-CM | POA: Diagnosis not present

## 2020-07-15 DIAGNOSIS — G5601 Carpal tunnel syndrome, right upper limb: Secondary | ICD-10-CM | POA: Diagnosis not present

## 2020-08-09 DIAGNOSIS — Z9889 Other specified postprocedural states: Secondary | ICD-10-CM | POA: Diagnosis not present

## 2020-08-17 ENCOUNTER — Other Ambulatory Visit: Payer: Self-pay | Admitting: Internal Medicine

## 2020-09-08 ENCOUNTER — Ambulatory Visit: Payer: Medicare Other | Admitting: Internal Medicine

## 2020-09-08 DIAGNOSIS — Z0289 Encounter for other administrative examinations: Secondary | ICD-10-CM

## 2020-09-15 ENCOUNTER — Ambulatory Visit: Payer: Medicare Other

## 2020-09-20 DIAGNOSIS — M65321 Trigger finger, right index finger: Secondary | ICD-10-CM | POA: Diagnosis not present

## 2020-09-21 ENCOUNTER — Ambulatory Visit: Payer: Medicare Other | Admitting: Internal Medicine

## 2020-09-21 ENCOUNTER — Ambulatory Visit: Payer: Medicare Other

## 2020-09-21 DIAGNOSIS — Z0289 Encounter for other administrative examinations: Secondary | ICD-10-CM

## 2020-10-12 ENCOUNTER — Other Ambulatory Visit: Payer: Self-pay | Admitting: Internal Medicine

## 2020-10-12 ENCOUNTER — Encounter: Payer: Self-pay | Admitting: Internal Medicine

## 2020-10-12 ENCOUNTER — Ambulatory Visit (INDEPENDENT_AMBULATORY_CARE_PROVIDER_SITE_OTHER): Payer: Medicare Other | Admitting: Internal Medicine

## 2020-10-12 ENCOUNTER — Other Ambulatory Visit: Payer: Self-pay

## 2020-10-12 DIAGNOSIS — I10 Essential (primary) hypertension: Secondary | ICD-10-CM

## 2020-10-12 DIAGNOSIS — G4700 Insomnia, unspecified: Secondary | ICD-10-CM | POA: Diagnosis not present

## 2020-10-12 DIAGNOSIS — E1151 Type 2 diabetes mellitus with diabetic peripheral angiopathy without gangrene: Secondary | ICD-10-CM | POA: Diagnosis not present

## 2020-10-12 DIAGNOSIS — I2510 Atherosclerotic heart disease of native coronary artery without angina pectoris: Secondary | ICD-10-CM | POA: Diagnosis not present

## 2020-10-12 DIAGNOSIS — R634 Abnormal weight loss: Secondary | ICD-10-CM

## 2020-10-12 DIAGNOSIS — R269 Unspecified abnormalities of gait and mobility: Secondary | ICD-10-CM | POA: Diagnosis not present

## 2020-10-12 LAB — COMPREHENSIVE METABOLIC PANEL
ALT: 12 U/L (ref 0–53)
AST: 15 U/L (ref 0–37)
Albumin: 4.3 g/dL (ref 3.5–5.2)
Alkaline Phosphatase: 78 U/L (ref 39–117)
BUN: 57 mg/dL — ABNORMAL HIGH (ref 6–23)
CO2: 30 mEq/L (ref 19–32)
Calcium: 9.7 mg/dL (ref 8.4–10.5)
Chloride: 101 mEq/L (ref 96–112)
Creatinine, Ser: 3.32 mg/dL — ABNORMAL HIGH (ref 0.40–1.50)
GFR: 16.96 mL/min — ABNORMAL LOW (ref >60.00)
Glucose, Bld: 158 mg/dL — ABNORMAL HIGH (ref 70–99)
Potassium: 3.8 mEq/L (ref 3.5–5.1)
Sodium: 140 mEq/L (ref 135–145)
Total Bilirubin: 0.7 mg/dL (ref 0.2–1.2)
Total Protein: 7.1 g/dL (ref 6.0–8.3)

## 2020-10-12 LAB — HEMOGLOBIN A1C: Hgb A1c MFr Bld: 6.7 % — ABNORMAL HIGH (ref 4.6–6.5)

## 2020-10-12 MED ORDER — IRBESARTAN 150 MG PO TABS: 75.0000 mg | ORAL_TABLET | Freq: Every day | ORAL | 3 refills | Status: DC

## 2020-10-12 NOTE — Patient Instructions (Signed)
Valerian root for insomnia 

## 2020-10-12 NOTE — Assessment & Plan Note (Addendum)
Check A1c Cont w/Metformin, Pravastatin, ASA

## 2020-10-12 NOTE — Progress Notes (Addendum)
Subjective:  Patient ID: Harold Pittman, male    DOB: [birth year]  Age: 80 y.o. MRN: [id number]  CC: Follow-up (4 month f/u)   HPI Harold Pittman presents for OA, HTN, DM  Outpatient Medications Prior to Visit  Medication Sig Dispense Refill   amLODipine (NORVASC) 10 MG tablet Take 1 tablet (10 mg total) by mouth daily. 90 tablet 3   aspirin 81 MG EC tablet Take 1 tablet (81 mg total) by mouth daily. 100 tablet 3   Blood Glucose Monitoring Suppl (ONETOUCH VERIO FLEX SYSTEM) w/Device KIT USE TO MONITOR BLOOD SUGAR 1 kit 0   carvedilol (COREG) 25 MG tablet TAKE 1 TABLET BY MOUTH  TWICE DAILY WITH A MEAL 180 tablet 2   Cholecalciferol (VITAMIN D3) 50 MCG (2000 UT) capsule Take 1 capsule (2,000 Units total) by mouth daily. 100 capsule 3   colchicine 0.6 MG tablet Take 1 tablet (0.6 mg total) by mouth 2 (two) times daily as needed. 30 tablet 0   Cyanocobalamin (VITAMIN B-12) 1000 MCG SUBL Place 1 tablet (1,000 mcg total) under the tongue daily. 100 tablet 3   cyclobenzaprine (FLEXERIL) 10 MG tablet Take 1 tablet (10 mg total) by mouth at bedtime. 90 tablet 3   docusate sodium (COLACE) 100 MG capsule Take 1 capsule (100 mg total) by mouth 2 (two) times daily. 10 capsule 0   famotidine (PEPCID) 40 MG tablet Take 1 tablet (40 mg total) by mouth daily. 90 tablet 3   furosemide (LASIX) 20 MG tablet TAKE 2 TABLETS BY MOUTH IN  THE MORNING 180 tablet 3   glucose blood (ONETOUCH VERIO) test strip CHECK BLOOD SUGAR 1 TO 2  TIMES DAILY AS DIRECTED 200 strip 3   hydrALAZINE (APRESOLINE) 50 MG tablet TAKE 1 TABLET BY MOUTH 3  TIMES DAILY 270 tablet 3   irbesartan (AVAPRO) 150 MG tablet TAKE 1 TABLET BY MOUTH  DAILY 90 tablet 3   Lancets (ONETOUCH DELICA PLUS [id number]) MISC USE AS DIRECTED 200 each 3   metFORMIN (GLUCOPHAGE-XR) 750 MG 24 hr tablet TAKE 1 TABLET BY MOUTH  DAILY WITH BREAKFAST 90 tablet 2   omeprazole (PRILOSEC) 40 MG capsule TAKE 1 CAPSULE BY MOUTH  DAILY 90 capsule 3   ondansetron  (ZOFRAN) 4 MG tablet Take 1 tablet (4 mg total) by mouth every 8 (eight) hours as needed for nausea or vomiting. 21 tablet 1   pravastatin (PRAVACHOL) 80 MG tablet TAKE 1 TABLET BY MOUTH AT  BEDTIME 90 tablet 3   tadalafil (ADCIRCA/CIALIS) 20 MG tablet Take 20 mg by mouth as needed for erectile dysfunction.     torsemide (DEMADEX) 100 MG tablet Take 0.5 tablets (50 mg total) by mouth daily. 90 tablet 3   ferrous sulfate 325 (65 FE) MG tablet Take 1 tablet (325 mg total) by mouth daily. 90 tablet 3   methylPREDNISolone (MEDROL DOSEPAK) 4 MG TBPK tablet As directed (Patient not taking: Reported on 10/12/2020) 21 tablet 0   No facility-administered medications prior to visit.    ROS: Review of Systems  Constitutional:  Positive for fatigue and unexpected weight change. Negative for appetite change.  HENT:  Negative for congestion, nosebleeds, sneezing, sore throat and trouble swallowing.   Eyes:  Negative for itching and visual disturbance.  Respiratory:  Negative for cough.   Cardiovascular:  Negative for chest pain, palpitations and leg swelling.  Gastrointestinal:  Negative for abdominal distention, blood in stool, diarrhea and nausea.  Genitourinary:  Negative for frequency and  hematuria.  Musculoskeletal:  Positive for arthralgias and gait problem. Negative for back pain, joint swelling and neck pain.  Skin:  Negative for rash.  Neurological:  Negative for dizziness, tremors, speech difficulty and weakness.  Psychiatric/Behavioral:  Positive for sleep disturbance. Negative for agitation and dysphoric mood. The patient is not nervous/anxious.    Objective:  BP (!) 122/58 (BP Location: Left Arm)   Pulse (!) 34   Temp 98.2 F (36.8 C) (Oral)   Ht 5' 11"  (1.803 m)   Wt 176 lb 9.6 oz (80.1 kg)   SpO2 96%   BMI 24.63 kg/m   BP Readings from Last 3 Encounters:  10/12/20 (!) 122/58  06/07/20 122/70  05/11/20 (!) 142/76    Wt Readings from Last 3 Encounters:  10/12/20 176 lb 9.6 oz  (80.1 kg)  06/07/20 181 lb 12.8 oz (82.5 kg)  05/11/20 183 lb 3.2 oz (83.1 kg)    Physical Exam Constitutional:      General: He is not in acute distress.    Appearance: He is well-developed.     Comments: NAD  Eyes:     Conjunctiva/sclera: Conjunctivae normal.     Pupils: Pupils are equal, round, and reactive to light.  Neck:     Thyroid: No thyromegaly.     Vascular: No JVD.  Cardiovascular:     Rate and Rhythm: Normal rate and regular rhythm.     Heart sounds: Normal heart sounds. No murmur heard.   No friction rub. No gallop.  Pulmonary:     Effort: Pulmonary effort is normal. No respiratory distress.     Breath sounds: Normal breath sounds. No wheezing or rales.  Chest:     Chest wall: No tenderness.  Abdominal:     General: Bowel sounds are normal. There is no distension.     Palpations: Abdomen is soft. There is no mass.     Tenderness: There is no abdominal tenderness. There is no guarding or rebound.  Musculoskeletal:        General: No tenderness. Normal range of motion.     Cervical back: Normal range of motion.  Lymphadenopathy:     Cervical: No cervical adenopathy.  Skin:    General: Skin is warm and dry.     Findings: No rash.  Neurological:     Mental Status: He is alert and oriented to person, place, and time.     Cranial Nerves: No cranial nerve deficit.     Motor: No abnormal muscle tone.     Coordination: Coordination abnormal.     Gait: Gait abnormal.     Deep Tendon Reflexes: Reflexes are normal and symmetric.  Psychiatric:        Behavior: Behavior normal.        Thought Content: Thought content normal.        Judgment: Judgment normal.  Using a cane  Lab Results  Component Value Date   WBC 5.5 11/18/2019   HGB 11.3 (L) 11/18/2019   HCT 34.1 (L) 11/18/2019   PLT 210 11/18/2019   GLUCOSE 206 (H) 05/11/2020   CHOL 120 07/21/2019   TRIG 68.0 07/21/2019   HDL 31.70 (L) 07/21/2019   LDLCALC 75 07/21/2019   ALT 25 11/18/2019   AST 25  11/18/2019   NA 143 05/11/2020   K 3.8 05/11/2020   CL 105 05/11/2020   CREATININE 1.85 (H) 05/11/2020   BUN 24 (H) 05/11/2020   CO2 32 05/11/2020   TSH 0.89 11/18/2019  PSA 8.92 (H) 04/22/2019   INR 1.35 07/23/2015   HGBA1C 6.4 05/11/2020   MICROALBUR 38.7 (H) 11/24/2013    US Abdomen Complete  Result Date: 09/04/2019 CLINICAL DATA:  Nausea, weight loss. EXAM: ABDOMEN ULTRASOUND COMPLETE COMPARISON:  October 31, 2005. FINDINGS: Gallbladder: Mild cholelithiasis is noted. No gallbladder wall thickening or pericholecystic fluid is noted. The presence or absence of sonographic Murphy's sign was not reported by the technologist. Common bile duct: Diameter: 3 mm which is within normal limits. Liver: 6 mm echogenic focus is noted in right hepatic lobe most consistent with hemangioma. Multiple hepatic cysts are noted, the largest measuring 1.9 cm in right hepatic lobe. Within normal limits in parenchymal echogenicity. Portal vein is patent on color Doppler imaging with normal direction of blood flow towards the liver. IVC: Not visualized due to overlying bowel gas. Pancreas: Visualized portion is unremarkable. Spleen: Size and appearance within normal limits. Right Kidney: Length: 12.3 cm. 3.7 cm simple cyst is seen in lower pole. 3.6 cm septated cyst is seen in upper pole. 4.8 cm multi-septated cyst is noted in midpole. Echogenicity within normal limits. No hydronephrosis visualized. Left Kidney: Length: 10.5 cm. 2.6 cm simple cyst is seen in lower pole. Echogenicity within normal limits. No mass or hydronephrosis visualized. Abdominal aorta: No aneurysm visualized. Other findings: None. IMPRESSION: Mild cholelithiasis is noted without definite evidence of cholecystitis. 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 Two complex cysts are noted in the right kidney, the largest being 4.8 cm multi-septated cyst in midpole. Further evaluation with MRI is recommended to rule out potential neoplasm. Electronically Signed    By: Marijo Conception M.D.   On: 09/04/2019 13:39    Assessment & Plan:      No orders of the defined types were placed in this encounter.    Follow-up: No follow-ups on file.  Walker Kehr, MD

## 2020-10-12 NOTE — Addendum Note (Signed)
Addended by: Jacobo Forest on: 10/12/2020 08:50 AM   Modules accepted: Orders

## 2020-10-12 NOTE — Assessment & Plan Note (Signed)
Try Valerian root for insomnia 

## 2020-10-12 NOTE — Assessment & Plan Note (Signed)
Cont w/Coreg, Hydralazine, Lasix,  Irbesartan

## 2020-10-12 NOTE — Assessment & Plan Note (Signed)
Cont w/ASA, Pravachol, Coreg  No angina

## 2020-10-12 NOTE — Assessment & Plan Note (Signed)
Use a cane 

## 2020-10-13 DIAGNOSIS — E119 Type 2 diabetes mellitus without complications: Secondary | ICD-10-CM | POA: Diagnosis not present

## 2020-10-13 DIAGNOSIS — H10413 Chronic giant papillary conjunctivitis, bilateral: Secondary | ICD-10-CM | POA: Diagnosis not present

## 2020-10-13 DIAGNOSIS — H2513 Age-related nuclear cataract, bilateral: Secondary | ICD-10-CM | POA: Diagnosis not present

## 2020-10-13 DIAGNOSIS — H04123 Dry eye syndrome of bilateral lacrimal glands: Secondary | ICD-10-CM | POA: Diagnosis not present

## 2020-10-13 DIAGNOSIS — H0102A Squamous blepharitis right eye, upper and lower eyelids: Secondary | ICD-10-CM | POA: Diagnosis not present

## 2020-10-13 DIAGNOSIS — H0102B Squamous blepharitis left eye, upper and lower eyelids: Secondary | ICD-10-CM | POA: Diagnosis not present

## 2020-10-13 LAB — HM DIABETES EYE EXAM

## 2020-10-17 ENCOUNTER — Encounter: Payer: Self-pay | Admitting: Internal Medicine

## 2020-10-27 ENCOUNTER — Telehealth: Payer: Self-pay | Admitting: Internal Medicine

## 2020-10-27 NOTE — Telephone Encounter (Signed)
Pt dropped off a Disability Parking form to be filled out. Pt would like this mailed back  Placed in Providers box

## 2020-10-27 NOTE — Telephone Encounter (Signed)
Place on MD desk in blue folder.Marland KitchenJohny Pittman

## 2020-11-01 NOTE — Telephone Encounter (Signed)
Okay.  Thanks.

## 2020-11-01 NOTE — Telephone Encounter (Signed)
Mailed handi-cap form to the address on file.Marland KitchenJohny Chess

## 2020-11-11 ENCOUNTER — Other Ambulatory Visit: Payer: Self-pay | Admitting: Internal Medicine

## 2020-11-15 ENCOUNTER — Telehealth: Payer: Self-pay | Admitting: Internal Medicine

## 2020-11-15 NOTE — Chronic Care Management (AMB) (Signed)
  Chronic Care Management   Note  11/15/2020 Name: Harold Pittman MRN: [id number] DOB: 1940-09-26  Harold Pittman is a 80 y.o. year old male who is a primary care patient of Plotnikov, Evie Lacks, MD. I reached out to [place] by phone today in response to a referral sent by Harold Pittman PCP, Plotnikov, Evie Lacks, MD.   Harold Pittman was given information about Chronic Care Management services today including:  CCM service includes personalized support from designated clinical staff supervised by his physician, including individualized plan of care and coordination with other care providers 24/7 contact phone numbers for assistance for urgent and routine care needs. Service will only be billed when office clinical staff spend 20 minutes or more in a month to coordinate care. Only one practitioner may furnish and bill the service in a calendar month. The patient may stop CCM services at any time (effective at the end of the month) by phone call to the office staff.   Patient agreed to services and verbal consent obtained.   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

## 2020-11-18 ENCOUNTER — Telehealth: Payer: Self-pay | Admitting: Internal Medicine

## 2020-11-18 DIAGNOSIS — N1832 Chronic kidney disease, stage 3b: Secondary | ICD-10-CM

## 2020-11-18 DIAGNOSIS — I10 Essential (primary) hypertension: Secondary | ICD-10-CM

## 2020-11-18 NOTE — Telephone Encounter (Signed)
Patient says at last appt w/ Dr. Alain Marion they spoke about getting a referral to Kidney specialist  Patient is wanting to know if referral can still be done  Please call [phone]

## 2020-11-21 NOTE — Telephone Encounter (Signed)
Per chart no referral has been placed.Marland KitchenJohny Pittman

## 2020-11-23 NOTE — Telephone Encounter (Signed)
Ok Thx 

## 2020-11-24 DIAGNOSIS — M65321 Trigger finger, right index finger: Secondary | ICD-10-CM | POA: Diagnosis not present

## 2020-11-24 DIAGNOSIS — G5601 Carpal tunnel syndrome, right upper limb: Secondary | ICD-10-CM | POA: Diagnosis not present

## 2020-11-24 NOTE — Telephone Encounter (Signed)
Called pt there was no answer LMOM MD has placed referral. He will received a call with appt information once appt has been made.Marland KitchenJohny Chess

## 2020-11-28 ENCOUNTER — Ambulatory Visit: Payer: Medicare Other

## 2020-11-28 NOTE — Progress Notes (Unsigned)
Chronic Care Management Pharmacy Note  11/28/2020 Name:  Harold Pittman MRN:  [id number] DOB:  [birth year]  Summary: ***  Recommendations/Changes made from today's visit: ***  Plan: ***  Subjective: Harold Pittman is an 80 y.o. year old male who is a primary patient of Harold Pittman.  The CCM team was consulted for assistance with disease management and care coordination needs.    {CCMTELEPHONEFACETOFACE:21091510} for {CCMINITIALFOLLOWUPCHOICE:21091511} in response to provider referral for pharmacy case management and/or care coordination services.   Consent to Services:  {CCMCONSENTOPTIONS:25074}  Patient Care Team: Harold Pittman as PCP - Cyndia Diver, Pittman as PCP - Cardiology (Cardiology) Michael Boston, Pittman as Consulting Physician (General Surgery) Mansouraty, Telford Nab., Pittman as Consulting Physician (Gastroenterology) Debbra Riding, Pittman as Consulting Physician (Ophthalmology) Tomasa Blase, [facility] as Pharmacist (Pharmacist)  Recent office visits: 10/12/2020 - Dr. Alain Marion (PCP) - no changes to medications- trial of valerian root for insomnia 06/17/2020 - Dr. Alain Marion (PCP) - carpal tunnel - r hand weakness/ tingling ongoing for 2 weeks   Recent consult visits: ***  Hospital visits: {Hospital DC Yes/No:25215}  Objective:  Lab Results  Component Value Date   CREATININE 3.32 (H) 10/12/2020   BUN 57 (H) 10/12/2020   GFR 16.96 (L) 10/12/2020   GFRNONAA 34 (L) 01/12/2019   GFRAA 40 (L) 01/12/2019   NA 140 10/12/2020   K 3.8 10/12/2020   CALCIUM 9.7 10/12/2020   CO2 30 10/12/2020   GLUCOSE 158 (H) 10/12/2020    Lab Results  Component Value Date/Time   HGBA1C 6.7 (H) 10/12/2020 08:50 AM   HGBA1C 6.4 05/11/2020 11:04 AM   GFR 16.96 (L) 10/12/2020 08:50 AM   GFR 34.32 (L) 05/11/2020 11:04 AM   MICROALBUR 38.7 (H) 11/24/2013 10:06 AM   MICROALBUR 57.7 (H) 03/22/2010 08:41 AM    Last diabetic Eye exam:  Lab Results   Component Value Date/Time   HMDIABEYEEXA No Retinopathy 10/13/2020 12:00 AM    Last diabetic Foot exam:  Lab Results  Component Value Date/Time   HMDIABFOOTEX Yes 02/16/2009 12:00 AM     Lab Results  Component Value Date   CHOL 120 07/21/2019   HDL 31.70 (L) 07/21/2019   LDLCALC 75 07/21/2019   TRIG 68.0 07/21/2019   CHOLHDL 4 07/21/2019    Hepatic Function Latest Ref Rng & Units 10/12/2020 11/18/2019 01/12/2019  Total Protein 6.0 - 8.3 g/dL 7.1 6.3 -  Albumin 3.5 - 5.2 g/dL 4.3 - 4.0  AST 0 - 37 U/L 15 25 -  ALT 0 - 53 U/L 12 25 -  Alk Phosphatase 39 - 117 U/L 78 - -  Total Bilirubin 0.2 - 1.2 mg/dL 0.7 0.7 -  Bilirubin, Direct 0.0 - 0.2 mg/dL - 0.2 -    Lab Results  Component Value Date/Time   TSH 0.89 11/18/2019 08:31 AM   TSH 0.579 01/10/2019 08:27 AM   TSH 0.71 10/20/2018 04:24 PM    CBC Latest Ref Rng & Units 11/18/2019 01/11/2019 01/09/2019  WBC 3.8 - 10.8 Thousand/uL 5.5 5.2 5.5  Hemoglobin 13.2 - 17.1 g/dL 11.3(L) 12.5(L) 12.3(L)  Hematocrit 38.5 - 50.0 % 34.1(L) 36.9(L) 38.1(L)  Platelets 140 - 400 Thousand/uL 210 194 203    No results found for: VD25OH  Clinical ASCVD: {YES/NO:21197} The ASCVD Risk score Mikey Bussing DC Jr., et al., 2013) failed to calculate for the following reasons:   The valid total cholesterol range is 130 to 320 mg/dL    Depression screen  [facility] 2/9 10/12/2020 09/16/2019 01/21/2019  Decreased Interest 0 0 0  Down, Depressed, Hopeless 0 0 0  PHQ - 2 Score 0 0 0  Altered sleeping 0 - -  Tired, decreased energy 1 - -  Change in appetite 0 - -  Feeling bad or failure about yourself  0 - -  Trouble concentrating 0 - -  Moving slowly or fidgety/restless 0 - -  Suicidal thoughts 0 - -  PHQ-9 Score 1 - -  Difficult doing work/chores Not difficult at all - -  Some recent data might be hidden     ***Other: (CHADS2VASc if Afib, MMRC or CAT for COPD, ACT, DEXA)  Social History   Tobacco Use  Smoking Status Former  Smokeless Tobacco Never   Tobacco Comments   Stopped 1997   BP Readings from Last 3 Encounters:  10/12/20 (!) 122/58  06/07/20 122/70  05/11/20 (!) 142/76   Pulse Readings from Last 3 Encounters:  10/12/20 (!) 34  06/07/20 66  05/11/20 73   Wt Readings from Last 3 Encounters:  10/12/20 176 lb 9.6 oz (80.1 kg)  06/07/20 181 lb 12.8 oz (82.5 kg)  05/11/20 183 lb 3.2 oz (83.1 kg)   BMI Readings from Last 3 Encounters:  10/12/20 24.63 kg/m  06/07/20 25.36 kg/m  05/11/20 25.55 kg/m    Assessment/Interventions: Review of patient past medical history, allergies, medications, health status, including review of consultants reports, laboratory and other test data, was performed as part of comprehensive evaluation and provision of chronic care management services.   SDOH:  (Social Determinants of Health) assessments and interventions performed: {yes/no:20286}  SDOH Screenings   Alcohol Screen: Not on file  Depression (PHQ2-9): Low Risk    PHQ-2 Score: 1  Financial Resource Strain: Not on file  Food Insecurity: Not on file  Housing: Not on file  Physical Activity: Not on file  Social Connections: Not on file  Stress: Not on file  Tobacco Use: Medium Risk   Smoking Tobacco Use: Former   Smokeless Tobacco Use: Never  Transportation Needs: Not on file    [place]  Allergies  Allergen Reactions   Benazepril Shortness Of Breath    Cough, wheezing    Medications Reviewed Today     Reviewed by Cassandria Anger, Pittman (Physician) on 10/12/20 at [phone]  Med List Status: <None>   Medication Order Taking? Sig Documenting Provider Last Dose Status Informant  amLODipine (NORVASC) 10 MG tablet [id number] Yes Take 1 tablet (10 mg total) by mouth daily. Harold Pittman Taking Active   aspirin 81 MG EC tablet [id number] Yes Take 1 tablet (81 mg total) by mouth daily. Harold Pittman Taking Active   Blood Glucose Monitoring Suppl (ONETOUCH VERIO FLEX SYSTEM) w/Device Drucie Opitz [id number] Yes  USE TO MONITOR BLOOD SUGAR Harold Pittman Taking Active   carvedilol (COREG) 25 MG tablet [id number] Yes TAKE 1 TABLET BY MOUTH  TWICE DAILY WITH A MEAL Harold Pittman Taking Active   Cholecalciferol (VITAMIN D3) 50 MCG (2000 UT) capsule [id number] Yes Take 1 capsule (2,000 Units total) by mouth daily. Harold Pittman Taking Active   colchicine 0.6 MG tablet [id number] Yes Take 1 tablet (0.6 mg total) by mouth 2 (two) times daily as needed. Marrian Salvage, FNP Taking Active   Cyanocobalamin (VITAMIN B-12) 1000 MCG SUBL [id number] Yes Place 1 tablet (1,000 mcg total) under the tongue daily. Harold Pittman Taking Active   cyclobenzaprine (  FLEXERIL) 10 MG tablet [id number] Yes Take 1 tablet (10 mg total) by mouth at bedtime. Harold Pittman Taking Active   docusate sodium (COLACE) 100 MG capsule [id number] Yes Take 1 capsule (100 mg total) by mouth 2 (two) times daily. Donne Hazel, Pittman Taking Active Self           Med Note Olena Heckle, [facility]   Tue Jun 03, 2018  9:50 AM)    famotidine (PEPCID) 40 MG tablet [id number] Yes Take 1 tablet (40 mg total) by mouth daily. Harold Pittman Taking Active   ferrous sulfate 325 (65 FE) MG tablet [id number]  Take 1 tablet (325 mg total) by mouth daily. Harold Pittman  Expired 04/22/20 2359   furosemide (LASIX) 20 MG tablet [id number] Yes TAKE 2 TABLETS BY MOUTH IN  THE MORNING Harold Pittman Taking Active   glucose blood (ONETOUCH VERIO) test strip [id number] Yes CHECK BLOOD SUGAR 1 TO 2  TIMES DAILY AS DIRECTED Harold Pittman Taking Active   hydrALAZINE (APRESOLINE) 50 MG tablet [id number] Yes TAKE 1 TABLET BY MOUTH 3  TIMES DAILY Harold Pittman Taking Active   irbesartan (AVAPRO) 150 MG tablet [id number] Yes TAKE 1 TABLET BY MOUTH  DAILY Harold Pittman Taking Active   Lancets (ONETOUCH DELICA PLUS OMAYOK59X) Delmita [id number] Yes USE AS DIRECTED Plotnikov, Evie Lacks,  Pittman Taking Active   metFORMIN (GLUCOPHAGE-XR) 750 MG 24 hr tablet [id number] Yes TAKE 1 TABLET BY MOUTH  DAILY WITH BREAKFAST Harold Pittman Taking Active   omeprazole (PRILOSEC) 40 MG capsule [id number] Yes TAKE 1 CAPSULE BY MOUTH  DAILY Harold Pittman Taking Active   ondansetron (ZOFRAN) 4 MG tablet [id number] Yes Take 1 tablet (4 mg total) by mouth every 8 (eight) hours as needed for nausea or vomiting. Harold Pittman Taking Active   pravastatin (PRAVACHOL) 80 MG tablet [id number] Yes TAKE 1 TABLET BY MOUTH AT  BEDTIME Harold Pittman Taking Active   tadalafil (ADCIRCA/CIALIS) 20 MG tablet [id number] Yes Take 20 mg by mouth as needed for erectile dysfunction. Provider, Historical, Pittman Taking Active Self  torsemide (DEMADEX) 100 MG tablet [id number] Yes Take 0.5 tablets (50 mg total) by mouth daily. Harold Pittman Taking Active             Patient Active Problem List   Diagnosis Date Noted   CTS (carpal tunnel syndrome) 03/24/2020   Grief 03/24/2020   Cholelithiasis 09/16/2019   Nausea 09/02/2019   Weight loss 09/02/2019   Elevated troponin 01/12/2019   Hypertensive urgency 01/10/2019   Well adult exam 10/20/2018   Balanitis 10/20/2018   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/27/2017   Adenoma of stomach 12/25/2017   Abnormal findings on esophagogastroduodenoscopy (EGD) 12/25/2017   Sebaceous cyst 10/04/2017   Iron deficiency anemia    Benign neoplasm of sigmoid colon    Benign neoplasm of cecum    Benign neoplasm of transverse colon    Positive occult stool blood test 09/24/2017   CKD (chronic kidney disease), stage III ([place]) 09/24/2017   Chronic diastolic CHF (congestive heart failure) ([place]) 09/24/2017   Generalized weakness 07/22/2015   Gait disorder 03/18/2014   Low back pain 01/12/2014   History of colon polyps 12/17/2013   DM (diabetes mellitus), type 2 with peripheral vascular complications ([place]) 73/56/7014    Diarrhea 10/08/2012   Edema 10/08/2011   DOE (dyspnea on exertion) 06/29/2011  LATERAL EPICONDYLITIS, LEFT 02/20/2010   TOBACCO USE, QUIT 02/23/2009   Cough 02/16/2009   DECREASED HEARING 10/15/2008   ERECTILE DYSFUNCTION 04/30/2008   CAD (coronary artery disease) 04/30/2008   COPD (chronic obstructive pulmonary disease) ([place]) 05/21/2007   Anxiety state 05/20/2007   Insomnia 03/04/2007   Dyslipidemia 02/01/2007   Gout 02/01/2007   Essential hypertension 02/01/2007   GERD 02/01/2007   Osteoarthritis 02/01/2007    Immunization History  Administered Date(s) Administered   Fluad Quad(high Dose 65+) 01/27/2020   Influenza Split 03/15/2011, 02/08/2012   Influenza Whole 03/04/2006, 03/04/2007, 03/15/2009, 12/14/2009   Influenza, High Dose Seasonal PF 03/16/2013, 12/14/2014, 01/30/2016, 12/31/2016, 01/08/2018, 01/06/2019   Influenza,inj,Quad PF,6+ Mos 12/17/2013   PFIZER(Purple Top)SARS-COV-2 Vaccination 05/14/2019, 06/04/2019, 01/27/2020   Pneumococcal Conjugate-13 09/16/2013   Pneumococcal Polysaccharide-23 12/20/2004, 03/15/2011   Tetanus 11/24/2013    Conditions to be addressed/monitored:  {USCCMDZASSESSMENTOPTIONS:23563}  There are no care plans that you recently modified to display for this patient.     Medication Assistance: {MEDASSISTANCEINFO:25044}  Compliance/Adherence/Medication fill history: Care Gaps: ***  Patient's preferred pharmacy is:  [facility] [place], [address] [place] [address] Phone: [phone] Fax: [phone]  [facility]  ([place], [address] [place] [address] Phone: [phone] Fax: [phone]   Uses pill box? {Yes or If no, why not?:20788} Pt endorses ***% compliance  Care Plan and Follow Up Patient Decision:  {FOLLOWUP:24991}  Plan: {CM FOLLOW  UP PLAN:25073}  ***

## 2020-11-29 ENCOUNTER — Emergency Department (HOSPITAL_COMMUNITY)
Admission: EM | Admit: 2020-11-29 | Discharge: 2020-11-29 | Disposition: A | Discharge status: Home / Self Care | Payer: Medicare Other | Attending: Emergency Medicine | Admitting: Emergency Medicine

## 2020-11-29 ENCOUNTER — Emergency Department (HOSPITAL_COMMUNITY): Payer: Medicare Other

## 2020-11-29 DIAGNOSIS — Z743 Need for continuous supervision: Secondary | ICD-10-CM | POA: Diagnosis not present

## 2020-11-29 DIAGNOSIS — I5032 Chronic diastolic (congestive) heart failure: Secondary | ICD-10-CM | POA: Diagnosis not present

## 2020-11-29 DIAGNOSIS — Z79899 Other long term (current) drug therapy: Secondary | ICD-10-CM | POA: Insufficient documentation

## 2020-11-29 DIAGNOSIS — N183 Chronic kidney disease, stage 3 unspecified: Secondary | ICD-10-CM | POA: Insufficient documentation

## 2020-11-29 DIAGNOSIS — Z7982 Long term (current) use of aspirin: Secondary | ICD-10-CM | POA: Insufficient documentation

## 2020-11-29 DIAGNOSIS — E1159 Type 2 diabetes mellitus with other circulatory complications: Secondary | ICD-10-CM | POA: Insufficient documentation

## 2020-11-29 DIAGNOSIS — R0789 Other chest pain: Secondary | ICD-10-CM | POA: Diagnosis not present

## 2020-11-29 DIAGNOSIS — I129 Hypertensive chronic kidney disease with stage 1 through stage 4 chronic kidney disease, or unspecified chronic kidney disease: Secondary | ICD-10-CM | POA: Diagnosis not present

## 2020-11-29 DIAGNOSIS — R079 Chest pain, unspecified: Secondary | ICD-10-CM | POA: Diagnosis not present

## 2020-11-29 DIAGNOSIS — I2510 Atherosclerotic heart disease of native coronary artery without angina pectoris: Secondary | ICD-10-CM | POA: Diagnosis not present

## 2020-11-29 DIAGNOSIS — I130 Hypertensive heart and chronic kidney disease with heart failure and stage 1 through stage 4 chronic kidney disease, or unspecified chronic kidney disease: Secondary | ICD-10-CM | POA: Insufficient documentation

## 2020-11-29 DIAGNOSIS — N189 Chronic kidney disease, unspecified: Secondary | ICD-10-CM | POA: Diagnosis not present

## 2020-11-29 DIAGNOSIS — R739 Hyperglycemia, unspecified: Secondary | ICD-10-CM | POA: Diagnosis not present

## 2020-11-29 DIAGNOSIS — J449 Chronic obstructive pulmonary disease, unspecified: Secondary | ICD-10-CM | POA: Insufficient documentation

## 2020-11-29 DIAGNOSIS — Z85038 Personal history of other malignant neoplasm of large intestine: Secondary | ICD-10-CM | POA: Diagnosis not present

## 2020-11-29 DIAGNOSIS — Z87891 Personal history of nicotine dependence: Secondary | ICD-10-CM | POA: Insufficient documentation

## 2020-11-29 DIAGNOSIS — Z7984 Long term (current) use of oral hypoglycemic drugs: Secondary | ICD-10-CM | POA: Insufficient documentation

## 2020-11-29 LAB — TROPONIN I (HIGH SENSITIVITY)
Troponin I (High Sensitivity): 17 ng/L (ref <18)
Troponin I (High Sensitivity): 15 ng/L (ref <18)

## 2020-11-29 LAB — CBC WITH DIFFERENTIAL/PLATELET
Abs Immature Granulocytes: 0.02 10*3/uL (ref 0.00–0.07)
Basophils Absolute: 0 10*3/uL (ref 0.0–0.1)
Basophils Relative: 0 %
Eosinophils Absolute: 0.3 10*3/uL (ref 0.0–0.5)
Eosinophils Relative: 5 %
HCT: 32.6 % — ABNORMAL LOW (ref 39.0–52.0)
Hemoglobin: 10.7 g/dL — ABNORMAL LOW (ref 13.0–17.0)
Immature Granulocytes: 0 %
Lymphocytes Relative: 22 %
Lymphs Abs: 1.4 10*3/uL (ref 0.7–4.0)
MCH: 28.1 pg (ref 26.0–34.0)
MCHC: 32.8 g/dL (ref 30.0–36.0)
MCV: 85.6 fL (ref 80.0–100.0)
Monocytes Absolute: 0.8 10*3/uL (ref 0.1–1.0)
Monocytes Relative: 13 %
Neutro Abs: 3.8 10*3/uL (ref 1.7–7.7)
Neutrophils Relative %: 60 %
Platelets: 239 10*3/uL (ref 150–400)
RBC: 3.81 MIL/uL — ABNORMAL LOW (ref 4.22–5.81)
RDW: 13.4 % (ref 11.5–15.5)
WBC: 6.4 10*3/uL (ref 4.0–10.5)
nRBC: 0 % (ref 0.0–0.2)

## 2020-11-29 LAB — COMPREHENSIVE METABOLIC PANEL
ALT: 16 U/L (ref 0–44)
AST: 21 U/L (ref 15–41)
Albumin: 3.9 g/dL (ref 3.5–5.0)
Alkaline Phosphatase: 75 U/L (ref 38–126)
Anion gap: 9 (ref 5–15)
BUN: 44 mg/dL — ABNORMAL HIGH (ref 8–23)
CO2: 29 mmol/L (ref 22–32)
Calcium: 9.5 mg/dL (ref 8.9–10.3)
Chloride: 102 mmol/L (ref 98–111)
Creatinine, Ser: 3.04 mg/dL — ABNORMAL HIGH (ref 0.61–1.24)
GFR, Estimated: 20 mL/min — ABNORMAL LOW (ref >60)
Glucose, Bld: 182 mg/dL — ABNORMAL HIGH (ref 70–99)
Potassium: 3.2 mmol/L — ABNORMAL LOW (ref 3.5–5.1)
Sodium: 140 mmol/L (ref 135–145)
Total Bilirubin: 0.6 mg/dL (ref 0.3–1.2)
Total Protein: 6.9 g/dL (ref 6.5–8.1)

## 2020-11-29 MED ORDER — POTASSIUM CHLORIDE CRYS ER 20 MEQ PO TBCR: 20.0000 meq | EXTENDED_RELEASE_TABLET | Freq: Once | ORAL | Status: DC

## 2020-11-29 NOTE — Discharge Instructions (Addendum)
You were seen in the emergency department for some left-sided chest pain.  You had blood work EKG and a chest x-ray that did not show any serious findings.  Please follow-up with your primary care doctor and contact your cardiologist for follow-up also.  Continue your regular medications.  Return to the emergency department if any worsening or concerning symptoms.

## 2020-11-29 NOTE — ED Provider Notes (Signed)
[facility] EMERGENCY DEPARTMENT Provider Note   CSN: [id number] Arrival date & time: 11/29/20  0945     History No chief complaint on file.   Harold Pittman is a 80 y.o. male.  He has a history of coronary disease and had a CABG about 5 years ago.  He is complaining of some left-sided chest pain that started last evening at rest.  Its been there consistently.  It does not radiate anywhere and is not associated with any other symptoms.  He has tried nothing for it.  It was still there this morning so he decided to come get it checked out.  The history is provided by the patient.  Chest Pain Pain location:  L chest Pain quality: throbbing   Pain radiates to:  Does not radiate Pain severity:  Moderate Onset quality:  Gradual Duration:  2 days Timing:  Constant Progression:  Unchanged Context: at rest   Relieved by:  None tried Worsened by:  Nothing Ineffective treatments:  None tried Associated symptoms: no abdominal pain, no back pain, no cough, no diaphoresis, no dizziness, no fever, no nausea, no shortness of breath and no vomiting   Risk factors: coronary artery disease, diabetes mellitus, high cholesterol and hypertension       Past Medical History:  Diagnosis Date   Anemia    low iron   Anxiety state, unspecified    Blood in stool    Chronic airway obstruction, not elsewhere classified    Coronary artery disease    Coronary atherosclerosis of artery bypass graft    Dyspnea    ED (erectile dysfunction)    Esophageal reflux    Gout, unspecified    Osteoarthrosis, unspecified whether generalized or localized, unspecified site    Other and unspecified hyperlipidemia    Other specified disorder of rectum and anus    Personal history of tobacco use, presenting hazards to health    Sleep apnea    does not use cpap   Type II or unspecified type diabetes mellitus without mention of complication, not stated as uncontrolled    Unspecified essential  hypertension     Patient Active Problem List   Diagnosis Date Noted   CTS (carpal tunnel syndrome) 03/24/2020   Grief 03/24/2020   Cholelithiasis 09/16/2019   Nausea 09/02/2019   Weight loss 09/02/2019   Elevated troponin 01/12/2019   Hypertensive urgency 01/10/2019   Well adult exam 10/20/2018   Balanitis 10/20/2018   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/27/2017   Adenoma of stomach 12/25/2017   Abnormal findings on esophagogastroduodenoscopy (EGD) 12/25/2017   Sebaceous cyst 10/04/2017   Iron deficiency anemia    Benign neoplasm of sigmoid colon    Benign neoplasm of cecum    Benign neoplasm of transverse colon    Positive occult stool blood test 09/24/2017   CKD (chronic kidney disease), stage III (HCC) 09/24/2017   Chronic diastolic CHF (congestive heart failure) ([place]) 09/24/2017   Generalized weakness 07/22/2015   Gait disorder 03/18/2014   Low back pain 01/12/2014   History of colon polyps 12/17/2013   DM (diabetes mellitus), type 2 with peripheral vascular complications ([place]) 40/98/1191   Diarrhea 10/08/2012   Edema 10/08/2011   DOE (dyspnea on exertion) 06/29/2011   LATERAL EPICONDYLITIS, LEFT 02/20/2010   TOBACCO USE, QUIT 02/23/2009   Cough 02/16/2009   DECREASED HEARING 10/15/2008   ERECTILE DYSFUNCTION 04/30/2008   CAD (coronary artery disease) 04/30/2008   COPD (chronic obstructive pulmonary disease) ([place])  05/21/2007   Anxiety state 05/20/2007   Insomnia 03/04/2007   Dyslipidemia 02/01/2007   Gout 02/01/2007   Essential hypertension 02/01/2007   GERD 02/01/2007   Osteoarthritis 02/01/2007    Past Surgical History:  Procedure Laterality Date   BIOPSY  09/26/2017   Procedure: BIOPSY;  Surgeon: Ladene Artist, MD;  Location: [facility] ENDOSCOPY;  Service: Endoscopy;;   BIOPSY  02/03/2018   Procedure: BIOPSY;  Surgeon: Irving Copas., MD;  Location: [facility];  Service: Gastroenterology;;   COLONOSCOPY WITH PROPOFOL N/A 09/26/2017    Procedure: COLONOSCOPY WITH PROPOFOL;  Surgeon: Ladene Artist, MD;  Location: [facility] ENDOSCOPY;  Service: Endoscopy;  Laterality: N/A;   CORONARY ARTERY BYPASS GRAFT  81   LIMA to LAD, sequential saphenous vein graft to the first and second diagonal, sequential saphenous vein graft to the intermediate OM and circumflex and SVG to RCA   ESOPHAGOGASTRODUODENOSCOPY (EGD) WITH PROPOFOL N/A 09/26/2017   Procedure: ESOPHAGOGASTRODUODENOSCOPY (EGD) WITH PROPOFOL;  Surgeon: Ladene Artist, MD;  Location: [facility] ENDOSCOPY;  Service: Endoscopy;  Laterality: N/A;   ESOPHAGOGASTRODUODENOSCOPY (EGD) WITH PROPOFOL N/A 02/03/2018   Procedure: ESOPHAGOGASTRODUODENOSCOPY (EGD);  Surgeon: Irving Copas., MD;  Location: [place];  Service: Gastroenterology;  Laterality: N/A;   KNEE SURGERY     BILATERAL   POLYPECTOMY  09/26/2017   Procedure: POLYPECTOMY;  Surgeon: Ladene Artist, MD;  Location: [facility] ENDOSCOPY;  Service: Endoscopy;;   [place] INJECTION  02/03/2018   Procedure: SUBMUCOSAL INJECTION;  Surgeon: Irving Copas., MD;  Location: [facility] ENDOSCOPY;  Service: Gastroenterology;;       Family History  Problem Relation Age of Onset   Depression Sister    Heart disease Sister    Heart disease Mother 22       CAD   Mental illness Father        Alzheimer's   Hypertension Other    Coronary artery disease Other        Male 1st degree relative <50   Colon cancer Neg Hx    Stomach cancer Neg Hx    Esophageal cancer Neg Hx    Liver disease Neg Hx    Pancreatic cancer Neg Hx    Rectal cancer Neg Hx     Social History   Tobacco Use   Smoking status: Former   Smokeless tobacco: Never   Tobacco comments:    Stopped 1997  Vaping Use   Vaping Use: Never used  Substance Use Topics   Alcohol use: No    Comment: Stopped 1997   Drug use: No    Home Medications Prior to Admission medications   Medication Sig Start Date End Date Taking? Authorizing Provider   amLODipine (NORVASC) 10 MG tablet Take 1 tablet (10 mg total) by mouth daily. 06/07/20   Plotnikov, Evie Lacks, MD  aspirin 81 MG EC tablet Take 1 tablet (81 mg total) by mouth daily. 04/22/19   Plotnikov, Evie Lacks, MD  Blood Glucose Monitoring Suppl (ONETOUCH VERIO FLEX SYSTEM) w/Device KIT USE TO MONITOR BLOOD SUGAR 06/28/20   Plotnikov, Evie Lacks, MD  carvedilol (COREG) 25 MG tablet TAKE 1 TABLET BY MOUTH  TWICE DAILY WITH A MEAL 11/14/20   Plotnikov, Evie Lacks, MD  Cholecalciferol (VITAMIN D3) 50 MCG (2000 UT) capsule Take 1 capsule (2,000 Units total) by mouth daily. 04/23/19   Plotnikov, Evie Lacks, MD  colchicine 0.6 MG tablet Take 1 tablet (0.6 mg total) by mouth 2 (two) times  daily as needed. 06/17/20   Marrian Salvage, FNP  Cyanocobalamin (VITAMIN B-12) 1000 MCG SUBL Place 1 tablet (1,000 mcg total) under the tongue daily. 04/23/19   Plotnikov, Evie Lacks, MD  cyclobenzaprine (FLEXERIL) 10 MG tablet Take 1 tablet (10 mg total) by mouth at bedtime. 09/02/19   Plotnikov, Evie Lacks, MD  docusate sodium (COLACE) 100 MG capsule Take 1 capsule (100 mg total) by mouth 2 (two) times daily. 07/26/15   Donne Hazel, MD  famotidine (PEPCID) 40 MG tablet Take 1 tablet (40 mg total) by mouth daily. 05/11/20   Plotnikov, Evie Lacks, MD  ferrous sulfate 325 (65 FE) MG tablet Take 1 tablet (325 mg total) by mouth daily. 04/23/19 04/22/20  Plotnikov, Evie Lacks, MD  furosemide (LASIX) 20 MG tablet TAKE 2 TABLETS BY MOUTH IN  THE MORNING 05/19/20   Plotnikov, Evie Lacks, MD  glucose blood (ONETOUCH VERIO) test strip CHECK BLOOD SUGAR 1 TO 2  TIMES DAILY AS DIRECTED 06/28/20   Plotnikov, Evie Lacks, MD  hydrALAZINE (APRESOLINE) 50 MG tablet TAKE 1 TABLET BY MOUTH 3  TIMES DAILY 08/18/20   Plotnikov, Evie Lacks, MD  irbesartan (AVAPRO) 150 MG tablet Take 0.5 tablets (75 mg total) by mouth daily. 10/12/20   Plotnikov, Evie Lacks, MD  Lancets (ONETOUCH DELICA PLUS [id number]) MISC USE AS DIRECTED 06/28/20   Plotnikov, Evie Lacks,  MD  metFORMIN (GLUCOPHAGE-XR) 750 MG 24 hr tablet TAKE 1 TABLET BY MOUTH  DAILY WITH BREAKFAST 11/14/20   Plotnikov, Evie Lacks, MD  omeprazole (PRILOSEC) 40 MG capsule TAKE 1 CAPSULE BY MOUTH  DAILY 03/02/20   Plotnikov, Evie Lacks, MD  ondansetron (ZOFRAN) 4 MG tablet Take 1 tablet (4 mg total) by mouth every 8 (eight) hours as needed for nausea or vomiting. 09/02/19   Plotnikov, Evie Lacks, MD  pravastatin (PRAVACHOL) 80 MG tablet TAKE 1 TABLET BY MOUTH AT  BEDTIME 08/18/20   Plotnikov, Evie Lacks, MD  tadalafil (ADCIRCA/CIALIS) 20 MG tablet Take 20 mg by mouth as needed for erectile dysfunction.    [provider]  torsemide (DEMADEX) 100 MG tablet Take 0.5 tablets (50 mg total) by mouth daily. 05/13/20   Plotnikov, Evie Lacks, MD    Allergies    Benazepril  Review of Systems   Review of Systems  Constitutional:  Negative for diaphoresis and fever.  HENT:  Negative for sore throat.   Eyes:  Negative for visual disturbance.  Respiratory:  Negative for cough and shortness of breath.   Cardiovascular:  Positive for chest pain.  Gastrointestinal:  Negative for abdominal pain, nausea and vomiting.  Genitourinary:  Negative for dysuria.  Musculoskeletal:  Negative for back pain.  Skin:  Negative for rash.  Neurological:  Negative for dizziness.   Physical Exam Updated Vital Signs BP 124/69 (BP Location: Left Arm)   Pulse (!) 55   Temp 98.6 F (37 C) (Oral)   Resp 18   SpO2 98%   Physical Exam Vitals and nursing note reviewed.  Constitutional:      Appearance: Normal appearance. He is well-developed.  HENT:     Head: Normocephalic and atraumatic.  Eyes:     Conjunctiva/sclera: Conjunctivae normal.  Cardiovascular:     Rate and Rhythm: Normal rate and regular rhythm.     Heart sounds: No murmur heard. Pulmonary:     Effort: Pulmonary effort is normal. No respiratory distress.     Breath sounds: Normal breath sounds.  Chest:     Chest wall: Tenderness  present.     Comments:  He has some left costochondral tenderness which reproduces his pain. Abdominal:     Palpations: Abdomen is soft.     Tenderness: There is no abdominal tenderness.  Musculoskeletal:        General: Normal range of motion.     Cervical back: Neck supple.     Right lower leg: No edema.     Left lower leg: No edema.  Skin:    General: Skin is warm and dry.  Neurological:     General: No focal deficit present.     Mental Status: He is alert.     Sensory: No sensory deficit.     Motor: No weakness.    ED Results / Procedures / Treatments   Labs (all labs ordered are listed, but only abnormal results are displayed) Labs Reviewed  CBC WITH DIFFERENTIAL/PLATELET - Abnormal; Notable for the following components:      Result Value   RBC 3.81 (*)    Hemoglobin 10.7 (*)    HCT 32.6 (*)    All other components within normal limits  COMPREHENSIVE METABOLIC PANEL - Abnormal; Notable for the following components:   Potassium 3.2 (*)    Glucose, Bld 182 (*)    BUN 44 (*)    Creatinine, Ser 3.04 (*)    GFR, Estimated 20 (*)    All other components within normal limits  TROPONIN I (HIGH SENSITIVITY)  TROPONIN I (HIGH SENSITIVITY)    EKG EKG Interpretation  Date/Time:  Tuesday November 29 2020 10:24:47 EDT Ventricular Rate:  62 PR Interval:  208 QRS Duration: 98 QT Interval:  432 QTC Calculation: 438 R Axis:   26 Text Interpretation: Normal sinus rhythm T wave abnormality, consider inferior ischemia Abnormal ECG inferior t wave inversions new from prior 10/20 Confirmed by Aletta Edouard [phone]) on 11/29/2020 1:43:58 PM  Radiology DG Chest 2 View  Result Date: 11/29/2020 CLINICAL DATA:  Mid chest pain radiating to LEFT side, history hypertension, coronary artery disease post remote CABG, former smoker, diabetes mellitus EXAM: CHEST - 2 VIEW COMPARISON:  01/09/2019 FINDINGS: Normal heart size post CABG. Mediastinal contours and pulmonary vascularity normal. Atherosclerotic calcification  aorta. Nipple shadows again identified. No pulmonary infiltrate, pleural effusion, or pneumothorax. Bones demineralized. IMPRESSION: No acute abnormalities. Aortic Atherosclerosis (ICD10-I70.0). Electronically Signed   By: Lavonia Dana M.D.   On: 11/29/2020 10:57    Procedures Procedures   Medications Ordered in ED Medications  potassium chloride SA (KLOR-CON) CR tablet 20 mEq (20 mEq Oral Not Given 11/29/20 1615)    ED Course  I have reviewed the triage vital signs and the nursing notes.  Pertinent labs & imaging results that were available during my care of the patient were reviewed by me and considered in my medical decision making (see chart for details).  Clinical Course as of 11/29/20 1850  Tue Nov 29, 2020  1542 Delta troponin unchanged.  Pain seems somewhat muscular.  Labs are otherwise at baseline for him. [MB]    Clinical Course User Index [MB] Hayden Rasmussen, MD   MDM Rules/Calculators/A&P                          This patient complains of central and left-sided chest pain ; this involves an extensive number of treatment Options and is a complaint that carries with it a high risk of complications and Morbidity. The differential includes ACS, pneumonia, PE, musculoskeletal, reflux, vascular  I ordered, reviewed and interpreted labs, which included CBC with normal white count, hemoglobin slightly lower than baseline, chemistries with mildly low potassium, elevated BUN and creatinine similar to priors, normal LFTs, troponins flat I ordered medication oral potassium repletion I ordered imaging studies which included chest x-ray and I independently    visualized and interpreted imaging which showed no acute findings Previous records obtained and reviewed in epic no recent admissions  After the interventions stated above, I reevaluated the patient and found patient to be hemodynamically stable.  Recommended close follow-up with his primary care doctor and his cardiologist.   Recommended he try some antacid medication and some Tylenol for his pain.  Return instructions discussed   Final Clinical Impression(s) / ED Diagnoses Final diagnoses:  Nonspecific chest pain  Chronic kidney disease, unspecified CKD stage    Rx / DC Orders ED Discharge Orders     None        Hayden Rasmussen, MD 11/29/20 [phone]

## 2020-11-29 NOTE — ED Triage Notes (Signed)
Pt here from home with ems with c/o cp with movement , no n/v or sob

## 2020-11-29 NOTE — ED Provider Notes (Signed)
Emergency Medicine Provider Triage Evaluation Note  Harold Pittman , a 80 y.o. male  was evaluated in triage.  Pt complains of gradual onset, constant, left sided chest pain that began yesterday. No SOB, nausea, vomiting, diaphoresis. Has not taken anything for the past. Hx of CABG in 1997. Former smoker however quit in '97. No other complaints at this time.   Review of Systems  Positive: + chest pain Negative: - SOB, fevers, nausea, vomiting, diaphoresis  Physical Exam  BP 119/74 (BP Location: Right Arm)   Pulse 64   Temp 98.6 F (37 C) (Oral)   Resp 16   SpO2 96%  Gen:   Awake, no distress   Resp:  Normal effort  MSK:   Moves extremities without difficulty  Other:    Medical Decision Making  Medically screening exam initiated at 10:19 AM.  Appropriate orders placed.  Harold Pittman was informed that the remainder of the evaluation will be completed by another provider, this initial triage assessment does not replace that evaluation, and the importance of remaining in the ED until their evaluation is complete.  ACS workup.    Eustaquio Maize, PA-C 11/29/20 1020    Malvin Johns, MD 11/29/20 1101

## 2020-12-05 ENCOUNTER — Encounter: Payer: Self-pay | Admitting: Internal Medicine

## 2020-12-05 ENCOUNTER — Other Ambulatory Visit: Payer: Self-pay

## 2020-12-05 ENCOUNTER — Ambulatory Visit (INDEPENDENT_AMBULATORY_CARE_PROVIDER_SITE_OTHER): Payer: Medicare Other

## 2020-12-05 ENCOUNTER — Ambulatory Visit (INDEPENDENT_AMBULATORY_CARE_PROVIDER_SITE_OTHER): Payer: Medicare Other | Admitting: Internal Medicine

## 2020-12-05 VITALS — BP 128/62 | HR 74 | Temp 98.4°F | Ht 71.0 in | Wt 174.0 lb

## 2020-12-05 DIAGNOSIS — R269 Unspecified abnormalities of gait and mobility: Secondary | ICD-10-CM

## 2020-12-05 DIAGNOSIS — E1151 Type 2 diabetes mellitus with diabetic peripheral angiopathy without gangrene: Secondary | ICD-10-CM | POA: Diagnosis not present

## 2020-12-05 DIAGNOSIS — I700 Atherosclerosis of aorta: Secondary | ICD-10-CM | POA: Diagnosis not present

## 2020-12-05 DIAGNOSIS — K5900 Constipation, unspecified: Secondary | ICD-10-CM | POA: Insufficient documentation

## 2020-12-05 DIAGNOSIS — R0789 Other chest pain: Secondary | ICD-10-CM

## 2020-12-05 DIAGNOSIS — N1832 Chronic kidney disease, stage 3b: Secondary | ICD-10-CM

## 2020-12-05 DIAGNOSIS — E876 Hypokalemia: Secondary | ICD-10-CM

## 2020-12-05 DIAGNOSIS — K5901 Slow transit constipation: Secondary | ICD-10-CM

## 2020-12-05 DIAGNOSIS — M25522 Pain in left elbow: Secondary | ICD-10-CM | POA: Diagnosis not present

## 2020-12-05 DIAGNOSIS — M19022 Primary osteoarthritis, left elbow: Secondary | ICD-10-CM | POA: Diagnosis not present

## 2020-12-05 MED ORDER — POTASSIUM CHLORIDE CRYS ER 10 MEQ PO TBCR: 10.0000 meq | EXTENDED_RELEASE_TABLET | Freq: Every day | ORAL | 0 refills | Status: DC

## 2020-12-05 MED ORDER — HYDROCODONE-ACETAMINOPHEN 5-325 MG PO TABS: 1.0000 | ORAL_TABLET | Freq: Four times a day (QID) | ORAL | 0 refills | Status: DC | PRN

## 2020-12-05 NOTE — Assessment & Plan Note (Signed)
Cont w/Metformin, Pravastatin, ASA °

## 2020-12-05 NOTE — Assessment & Plan Note (Signed)
Negative recent ER w/up. No relapse

## 2020-12-05 NOTE — Assessment & Plan Note (Signed)
R/o fx X ray ACE wrap Norco prn  Potential benefits of a short term opioids use as well as potential risks (i.e. addiction risk, apnea etc) and complications (i.e. Somnolence, constipation and others) were explained to the patient and were aknowledged. Miralax prn

## 2020-12-05 NOTE — Patient Instructions (Signed)
Miralax and/or Senakot as needed Arm sling

## 2020-12-05 NOTE — Progress Notes (Signed)
Subjective:  Patient ID: Harold Pittman, male    DOB: [birth year]  Age: 80 y.o. MRN: [id number]  CC: Follow-up (ER F/U) and Fall (Pt states he fell on last Thursday his (L) arm is still sore. Also concern about a knot on this (R) elbow. He states that's not from the fall)   HPI [name] presents for CP - s/p ER visit; no more CP. His K 3.2 was low. Pt fell last Thur - fell on gravel. C/o R elbow swellig  C/o constipation  Outpatient Medications Prior to Visit  Medication Sig Dispense Refill   amLODipine (NORVASC) 10 MG tablet Take 1 tablet (10 mg total) by mouth daily. 90 tablet 3   aspirin 81 MG EC tablet Take 1 tablet (81 mg total) by mouth daily. 100 tablet 3   Blood Glucose Monitoring Suppl (ONETOUCH VERIO FLEX SYSTEM) w/Device KIT USE TO MONITOR BLOOD SUGAR 1 kit 0   carvedilol (COREG) 25 MG tablet TAKE 1 TABLET BY MOUTH  TWICE DAILY WITH A MEAL 180 tablet 3   Cholecalciferol (VITAMIN D3) 50 MCG (2000 UT) capsule Take 1 capsule (2,000 Units total) by mouth daily. 100 capsule 3   colchicine 0.6 MG tablet Take 1 tablet (0.6 mg total) by mouth 2 (two) times daily as needed. 30 tablet 0   Cyanocobalamin (VITAMIN B-12) 1000 MCG SUBL Place 1 tablet (1,000 mcg total) under the tongue daily. 100 tablet 3   cyclobenzaprine (FLEXERIL) 10 MG tablet Take 1 tablet (10 mg total) by mouth at bedtime. 90 tablet 3   docusate sodium (COLACE) 100 MG capsule Take 1 capsule (100 mg total) by mouth 2 (two) times daily. 10 capsule 0   famotidine (PEPCID) 40 MG tablet Take 1 tablet (40 mg total) by mouth daily. 90 tablet 3   furosemide (LASIX) 20 MG tablet TAKE 2 TABLETS BY MOUTH IN  THE MORNING 180 tablet 3   glucose blood (ONETOUCH VERIO) test strip CHECK BLOOD SUGAR 1 TO 2  TIMES DAILY AS DIRECTED 200 strip 3   hydrALAZINE (APRESOLINE) 50 MG tablet TAKE 1 TABLET BY MOUTH 3  TIMES DAILY 270 tablet 3   irbesartan (AVAPRO) 150 MG tablet Take 0.5 tablets (75 mg total) by mouth daily. 45 tablet 3    Lancets (ONETOUCH DELICA PLUS [id number]) MISC USE AS DIRECTED 200 each 3   metFORMIN (GLUCOPHAGE-XR) 750 MG 24 hr tablet TAKE 1 TABLET BY MOUTH  DAILY WITH BREAKFAST 90 tablet 3   omeprazole (PRILOSEC) 40 MG capsule TAKE 1 CAPSULE BY MOUTH  DAILY 90 capsule 3   ondansetron (ZOFRAN) 4 MG tablet Take 1 tablet (4 mg total) by mouth every 8 (eight) hours as needed for nausea or vomiting. 21 tablet 1   pravastatin (PRAVACHOL) 80 MG tablet TAKE 1 TABLET BY MOUTH AT  BEDTIME 90 tablet 3   tadalafil (ADCIRCA/CIALIS) 20 MG tablet Take 20 mg by mouth as needed for erectile dysfunction.     torsemide (DEMADEX) 100 MG tablet Take 0.5 tablets (50 mg total) by mouth daily. 90 tablet 3   ferrous sulfate 325 (65 FE) MG tablet Take 1 tablet (325 mg total) by mouth daily. 90 tablet 3   No facility-administered medications prior to visit.    ROS: Review of Systems  Constitutional:  Negative for appetite change, fatigue and unexpected weight change.  HENT:  Negative for congestion, nosebleeds, sneezing, sore throat and trouble swallowing.   Eyes:  Negative for itching and visual disturbance.  Respiratory:  Negative for cough.   Cardiovascular:  Negative for chest pain, palpitations and leg swelling.  Gastrointestinal:  Positive for constipation. Negative for abdominal distention, blood in stool, diarrhea and nausea.  Genitourinary:  Negative for frequency and hematuria.  Musculoskeletal:  Positive for arthralgias and gait problem. Negative for back pain, joint swelling and neck pain.  Skin:  Negative for rash.  Neurological:  Negative for dizziness, tremors, speech difficulty, weakness and light-headedness.  Psychiatric/Behavioral:  Negative for agitation, dysphoric mood and sleep disturbance. The patient is not nervous/anxious.    Objective:  BP 128/62 (BP Location: Left Arm)   Pulse 74   Temp 98.4 F (36.9 C) (Oral)   Ht 5' 11"  (1.803 m)   Wt 174 lb (78.9 kg)   SpO2 97%   BMI 24.27 kg/m   BP  Readings from Last 3 Encounters:  12/05/20 128/62  11/29/20 130/66  10/12/20 (!) 122/58    Wt Readings from Last 3 Encounters:  12/05/20 174 lb (78.9 kg)  10/12/20 176 lb 9.6 oz (80.1 kg)  06/07/20 181 lb 12.8 oz (82.5 kg)    Physical Exam Constitutional:      General: He is not in acute distress.    Appearance: He is well-developed.     Comments: NAD  Eyes:     Conjunctiva/sclera: Conjunctivae normal.     Pupils: Pupils are equal, round, and reactive to light.  Neck:     Thyroid: No thyromegaly.     Vascular: No JVD.  Cardiovascular:     Rate and Rhythm: Normal rate and regular rhythm.     Heart sounds: Normal heart sounds. No murmur heard.   No friction rub. No gallop.  Pulmonary:     Effort: Pulmonary effort is normal. No respiratory distress.     Breath sounds: Normal breath sounds. No wheezing or rales.  Chest:     Chest wall: No tenderness.  Abdominal:     General: Bowel sounds are normal. There is no distension.     Palpations: Abdomen is soft. There is no mass.     Tenderness: There is no abdominal tenderness. There is no guarding or rebound.  Musculoskeletal:        General: Normal range of motion.     Cervical back: Normal range of motion. Tenderness present.  Lymphadenopathy:     Cervical: No cervical adenopathy.  Skin:    General: Skin is warm and dry.     Findings: No rash.  Neurological:     Mental Status: He is alert and oriented to person, place, and time.     Cranial Nerves: No cranial nerve deficit.     Motor: No abnormal muscle tone.     Coordination: Coordination normal.     Gait: Gait normal.     Deep Tendon Reflexes: Reflexes are normal and symmetric.  Psychiatric:        Behavior: Behavior normal.        Thought Content: Thought content normal.        Judgment: Judgment normal.    L elbow is swollen - tender w/ROM Using a cane  Lab Results  Component Value Date   WBC 6.4 11/29/2020   HGB 10.7 (L) 11/29/2020   HCT 32.6 (L)  11/29/2020   PLT 239 11/29/2020   GLUCOSE 182 (H) 11/29/2020   CHOL 120 07/21/2019   TRIG 68.0 07/21/2019   HDL 31.70 (L) 07/21/2019   LDLCALC 75 07/21/2019   ALT 16 11/29/2020   AST 21 11/29/2020  NA 140 11/29/2020   K 3.2 (L) 11/29/2020   CL 102 11/29/2020   CREATININE 3.04 (H) 11/29/2020   BUN 44 (H) 11/29/2020   CO2 29 11/29/2020   TSH 0.89 11/18/2019   PSA 8.92 (H) 04/22/2019   INR 1.35 07/23/2015   HGBA1C 6.7 (H) 10/12/2020   MICROALBUR 38.7 (H) 11/24/2013    DG Chest 2 View  Result Date: 11/29/2020 CLINICAL DATA:  Mid chest pain radiating to LEFT side, history hypertension, coronary artery disease post remote CABG, former smoker, diabetes mellitus EXAM: CHEST - 2 VIEW COMPARISON:  01/09/2019 FINDINGS: Normal heart size post CABG. Mediastinal contours and pulmonary vascularity normal. Atherosclerotic calcification aorta. Nipple shadows again identified. No pulmonary infiltrate, pleural effusion, or pneumothorax. Bones demineralized. IMPRESSION: No acute abnormalities. Aortic Atherosclerosis (ICD10-I70.0). Electronically Signed   By: Lavonia Dana M.D.   On: 11/29/2020 10:57    Assessment & Plan:     Walker Kehr, MD

## 2020-12-05 NOTE — Assessment & Plan Note (Signed)
Miralax and/or Senakot as needed

## 2020-12-05 NOTE — Assessment & Plan Note (Signed)
Will replace potassium w/caution

## 2020-12-05 NOTE — Assessment & Plan Note (Signed)
Use a cane May need PT

## 2020-12-07 ENCOUNTER — Other Ambulatory Visit: Payer: Self-pay | Admitting: Internal Medicine

## 2020-12-07 DIAGNOSIS — M25522 Pain in left elbow: Secondary | ICD-10-CM

## 2020-12-09 ENCOUNTER — Other Ambulatory Visit: Payer: Self-pay | Admitting: Internal Medicine

## 2020-12-14 ENCOUNTER — Telehealth: Payer: Self-pay

## 2020-12-14 NOTE — Chronic Care Management (AMB) (Signed)
Chronic Care Management Pharmacy Assistant   Name: MATHEO RATHBONE  MRN: [id number] DOB: 1940-06-24  Harold Pittman is an 80 y.o. year old male who presents for his initial CCM visit with the clinical pharmacist.   Recent office visits:  12/05/20 Evie Lacks Plotnikov MD - Seen for hypokalemia - Labs ordered -  per provider's notes: replace potassium 10 MEQ w/caution and Miralax and/or Senakot as needed- Follow up in 2 weeks  10/12/20 Cassandria Anger MD - Seen for general follow up - Labs ordered - Per provider notes: Try Valerian root for insomnia - Follow up in 6 months   Recent consult visits:  07/15/20 Milly Jakob - [facility] - Seen for carpal tunnel syndrome - No notes available  07/12/20 Milly Jakob - [facility] - Seen for carpal tunnel syndrome - No notes available  07/06/20 Normajean Glasgow - [facility] - Seen for carpal tunnel syndrome - No notes available  06/16/20 Milly Jakob - [facility] [place] - Seen for carpal tunnel syndrome - No notes available    Hospital visits:  Medication Reconciliation was completed by comparing discharge summary, patient's EMR and Pharmacy list, and upon discussion with patient.  Admitted to the hospital on 11/29/20 due to Chest pain. Discharge date was 11/29/20. Discharged from [place]?Medications Started at [facility] Discharge:?? N/a  Medication Changes at Hospital Discharge: N/a  Medications Discontinued at Hospital Discharge: N/a  Medications that remain the same after Hospital Discharge:??  -All other medications will remain the same.    Medications: Outpatient Encounter Medications as of 12/14/2020  Medication Sig   amLODipine (NORVASC) 10 MG tablet Take 1 tablet (10 mg total) by mouth daily.   aspirin 81 MG EC tablet Take 1 tablet (81 mg total) by mouth daily.   Blood Glucose Monitoring Suppl (ONETOUCH VERIO FLEX SYSTEM) w/Device KIT USE TO  MONITOR BLOOD SUGAR   carvedilol (COREG) 25 MG tablet TAKE 1 TABLET BY MOUTH  TWICE DAILY WITH A MEAL   Cholecalciferol (VITAMIN D3) 50 MCG (2000 UT) capsule Take 1 capsule (2,000 Units total) by mouth daily.   colchicine 0.6 MG tablet Take 1 tablet (0.6 mg total) by mouth 2 (two) times daily as needed.   Cyanocobalamin (VITAMIN B-12) 1000 MCG SUBL Place 1 tablet (1,000 mcg total) under the tongue daily.   cyclobenzaprine (FLEXERIL) 10 MG tablet Take 1 tablet (10 mg total) by mouth at bedtime.   docusate sodium (COLACE) 100 MG capsule Take 1 capsule (100 mg total) by mouth 2 (two) times daily.   famotidine (PEPCID) 40 MG tablet Take 1 tablet (40 mg total) by mouth daily.   ferrous sulfate 325 (65 FE) MG tablet Take 1 tablet (325 mg total) by mouth daily.   furosemide (LASIX) 20 MG tablet TAKE 2 TABLETS BY MOUTH IN  THE MORNING   glucose blood (ONETOUCH VERIO) test strip CHECK BLOOD SUGAR 1 TO 2  TIMES DAILY AS DIRECTED   hydrALAZINE (APRESOLINE) 50 MG tablet TAKE 1 TABLET BY MOUTH 3  TIMES DAILY   HYDROcodone-acetaminophen (NORCO) 5-325 MG tablet Take 1 tablet by mouth every 6 (six) hours as needed for moderate pain.   irbesartan (AVAPRO) 150 MG tablet Take 0.5 tablets (75 mg total) by mouth daily.   Lancets (ONETOUCH DELICA PLUS [id number]) MISC USE AS DIRECTED   metFORMIN (GLUCOPHAGE-XR) 750 MG 24 hr tablet TAKE 1 TABLET BY MOUTH  DAILY WITH BREAKFAST  omeprazole (PRILOSEC) 40 MG capsule TAKE 1 CAPSULE BY MOUTH  DAILY   ondansetron (ZOFRAN) 4 MG tablet Take 1 tablet (4 mg total) by mouth every 8 (eight) hours as needed for nausea or vomiting.   potassium chloride (KLOR-CON) 10 MEQ tablet TAKE 1 TABLET(10 MEQ) BY MOUTH DAILY   pravastatin (PRAVACHOL) 80 MG tablet TAKE 1 TABLET BY MOUTH AT  BEDTIME   tadalafil (ADCIRCA/CIALIS) 20 MG tablet Take 20 mg by mouth as needed for erectile dysfunction.   torsemide (DEMADEX) 100 MG tablet Take 0.5 tablets (50 mg total) by mouth daily.   No  facility-administered encounter medications on file as of 12/14/2020.   amLODipine (NORVASC) 10 MG tablet - Last filled 06/07/20 90 DS carvedilol (COREG) 25 MG tablet - Last filled 11/14/20 90 DS colchicine 0.6 MG tablet - Last filled  06/17/20 30 DS Cyanocobalamin (VITAMIN B-12) 1000 MCG SUBL - Last filled 04/23/19 100 DS  cyclobenzaprine (FLEXERIL) 10 MG tablet - Last filled 09/02/19 90 DS famotidine (PEPCID) 40 MG tablet - Last filled 05/11/20 90 DS ferrous sulfate 325 (65 FE) MG tablet (Expired) - Last filled 04/23/2019 90 DS furosemide (LASIX) 20 MG tablet - Last filled 05/19/20 90 DS hydrALAZINE (APRESOLINE) 50 MG tablet- Last filled 08/18/20 DS HYDROcodone-acetaminophen (NORCO) 5-325 MG tablet - Last filled 12/05/20 irbesartan (AVAPRO) 150 MG tablet - Last filled 10/12/20 90 DS metFORMIN (GLUCOPHAGE-XR) 750 MG 24 hr tablet - Last filled 11/14/20 90 DS omeprazole (PRILOSEC) 40 MG capsule - Last filled 03/02/20 90 DS potassium chloride (KLOR-CON) 10 MEQ tablet- Last filled 20 tablets pravastatin (PRAVACHOL) 80 MG tablet - Last filled 08/18/20 DS torsemide (DEMADEX) 100 MG tablet - Last filled 05/13/20 90 DS    Star Rating Drugs: irbesartan (AVAPRO) 150 MG tablet - Last filled 10/12/20 90 DS metFORMIN (GLUCOPHAGE-XR) 750 MG 24 hr tablet - Last filled 11/14/20 90 DS pravastatin (PRAVACHOL) 80 MG tablet - Last filled 08/18/20 DS   Debbie Ortiz, CMA   

## 2020-12-16 ENCOUNTER — Ambulatory Visit: Payer: Medicare Other

## 2020-12-16 ENCOUNTER — Telehealth: Payer: Medicare Other

## 2020-12-16 NOTE — Progress Notes (Deleted)
Chronic Care Management Pharmacy Note  12/16/2020 Name:  FINNIAN Pittman MRN:  [id number] DOB:  [birth year]  Summary: ***  Recommendations/Changes made from today's visit: ***  Plan: ***  Subjective: Harold Pittman is an 80 y.o. year old male who is a primary patient of Plotnikov, Evie Lacks, MD.  The CCM team was consulted for assistance with disease management and care coordination needs.    Engaged with patient by telephone for initial visit in response to provider referral for pharmacy case management and/or care coordination services.   Consent to Services:  The patient was given the following information about Chronic Care Management services today, agreed to services, and gave verbal consent: 1. CCM service includes personalized support from designated clinical staff supervised by the primary care provider, including individualized plan of care and coordination with other care providers 2. 24/7 contact phone numbers for assistance for urgent and routine care needs. 3. Service will only be billed when office clinical staff spend 20 minutes or more in a month to coordinate care. 4. Only one practitioner may furnish and bill the service in a calendar month. 5.The patient may stop CCM services at any time (effective at the end of the month) by phone call to the office staff. 6. The patient will be responsible for cost sharing (co-pay) of up to 20% of the service fee (after annual deductible is met). Patient agreed to services and consent obtained.  Patient Care Team: Plotnikov, Evie Lacks, MD as PCP - Harold Diver, MD as PCP - Cardiology (Cardiology) Harold Boston, MD as Consulting Physician (General Surgery) Mansouraty, Telford Nab., MD as Consulting Physician (Gastroenterology) Harold Pittman, Harold Guys, MD as Consulting Physician (Ophthalmology) Harold Pittman, [facility] as Pharmacist (Pharmacist)  Recent office visits:  12/05/20 Harold Anger MD - Seen for hypokalemia/ ED  visit follow up/ fall- Labs ordered -  per provider's notes: replace potassium 10 MEQ w/caution and Miralax and/or Senakot as needed- Follow up in 2 weeks  10/12/20 Harold Anger MD - Seen for general follow up - Labs ordered - Per provider notes: Try Valerian root for insomnia - Follow up in 6 months     Recent consult visits:  07/15/20 Harold Pittman - [facility] - Seen for carpal tunnel syndrome - No notes available  07/12/20 Harold Pittman - [facility] [place] - Seen for carpal tunnel syndrome - No notes available  07/06/20 Harold Pittman - [facility] - Seen for carpal tunnel syndrome - No notes available  06/16/20 Harold Pitt[facility]  - Seen for carpal tunnel syndrome - No notes available      Hospital visits:  Medication Reconciliation was completed by comparing discharge summary, patient's EMR and Pharmacy list, and upon discussion with patient.   Admitted to the hospital on 11/29/20 due to Chest pain. Discharge date was 11/29/20. Discharged from [place]?Medications Started at [facility] Discharge:?? N/a   Medication Changes at Hospital Discharge: N/a   Medications Discontinued at Hospital Discharge: N/a   Medications that remain the same after Hospital Discharge:??  -All other medications will remain the same.   Objective:  Lab Results  Component Value Date   CREATININE 3.04 (H) 11/29/2020   BUN 44 (H) 11/29/2020   GFR 16.96 (L) 10/12/2020   GFRNONAA 20 (L) 11/29/2020   GFRAA 40 (L) 01/12/2019   NA 140 11/29/2020   K 3.2 (L) 11/29/2020   CALCIUM 9.5  11/29/2020   CO2 29 11/29/2020   GLUCOSE 182 (H) 11/29/2020    Lab Results  Component Value Date/Time   HGBA1C 6.7 (H) 10/12/2020 08:50 AM   HGBA1C 6.4 05/11/2020 11:04 AM   GFR 16.96 (L) 10/12/2020 08:50 AM   GFR 34.32 (L) 05/11/2020 11:04 AM   MICROALBUR 38.7 (H) 11/24/2013 10:06 AM   MICROALBUR 57.7 (H) 03/22/2010 08:41  AM    Last diabetic Eye exam:  Lab Results  Component Value Date/Time   HMDIABEYEEXA No Retinopathy 10/13/2020 12:00 AM    Last diabetic Foot exam:  Lab Results  Component Value Date/Time   HMDIABFOOTEX Yes 02/16/2009 12:00 AM     Lab Results  Component Value Date   CHOL 120 07/21/2019   HDL 31.70 (L) 07/21/2019   LDLCALC 75 07/21/2019   TRIG 68.0 07/21/2019   CHOLHDL 4 07/21/2019    Hepatic Function Latest Ref Rng & Units 11/29/2020 10/12/2020 11/18/2019  Total Protein 6.5 - 8.1 g/dL 6.9 7.1 6.3  Albumin 3.5 - 5.0 g/dL 3.9 4.3 -  AST 15 - 41 U/L 21 15 25   ALT 0 - 44 U/L 16 12 25   Alk Phosphatase 38 - 126 U/L 75 78 -  Total Bilirubin 0.3 - 1.2 mg/dL 0.6 0.7 0.7  Bilirubin, Direct 0.0 - 0.2 mg/dL - - 0.2    Lab Results  Component Value Date/Time   TSH 0.89 11/18/2019 08:31 AM   TSH 0.579 01/10/2019 08:27 AM   TSH 0.71 10/20/2018 04:24 PM    CBC Latest Ref Rng & Units 11/29/2020 11/18/2019 01/11/2019  WBC 4.0 - 10.5 K/uL 6.4 5.5 5.2  Hemoglobin 13.0 - 17.0 g/dL 10.7(L) 11.3(L) 12.5(L)  Hematocrit 39.0 - 52.0 % 32.6(L) 34.1(L) 36.9(L)  Platelets 150 - 400 K/uL 239 210 194    No results found for: VD25OH  Clinical ASCVD: {YES/NO:21197} The ASCVD Risk score (Arnett DK, et al., 2019) failed to calculate for the following reasons:   The valid total cholesterol range is 130 to 320 mg/dL    Depression screen [facility] 2/9 10/12/2020 09/16/2019 01/21/2019  Decreased Interest 0 0 0  Down, Depressed, Hopeless 0 0 0  PHQ - 2 Score 0 0 0  Altered sleeping 0 - -  Tired, decreased energy 1 - -  Change in appetite 0 - -  Feeling bad or failure about yourself  0 - -  Trouble concentrating 0 - -  Moving slowly or fidgety/restless 0 - -  Suicidal thoughts 0 - -  PHQ-9 Score 1 - -  Difficult doing work/chores Not difficult at all - -  Some recent data might be hidden    Social History   Tobacco Use  Smoking Status Former  Smokeless Tobacco Never  Tobacco Comments   Stopped 1997    BP Readings from Last 3 Encounters:  12/05/20 128/62  11/29/20 130/66  10/12/20 (!) 122/58   Pulse Readings from Last 3 Encounters:  12/05/20 74  11/29/20 65  10/12/20 (!) 34   Wt Readings from Last 3 Encounters:  12/05/20 174 lb (78.9 kg)  10/12/20 176 lb 9.6 oz (80.1 kg)  06/07/20 181 lb 12.8 oz (82.5 kg)   BMI Readings from Last 3 Encounters:  12/05/20 24.27 kg/m  10/12/20 24.63 kg/m  06/07/20 25.36 kg/m    Assessment/Interventions: Review of patient past medical history, allergies, medications, health status, including review of consultants reports, laboratory and other test data, was performed as part of comprehensive evaluation and provision of chronic care management services.  SDOH:  (Social Determinants of Health) assessments and interventions performed: {yes/no:20286}  SDOH Screenings   Alcohol Screen: Not on file  Depression (PHQ2-9): Low Risk    PHQ-2 Score: 1  Financial Resource Strain: Not on file  Food Insecurity: Not on file  Housing: Not on file  Physical Activity: Not on file  Social Connections: Not on file  Stress: Not on file  Tobacco Use: Medium Risk   Smoking Tobacco Use: Former   Smokeless Tobacco Use: Never  Transportation Needs: Not on file    [place]  Allergies  Allergen Reactions   Benazepril Shortness Of Breath    Cough, wheezing    Medications Reviewed Today     Reviewed by Earnstine Regal, RMA (Registered Medical Assistant) on 12/05/20 at 1013  Med List Status: <None>   Medication Order Taking? Sig Documenting Provider Last Dose Status Informant  amLODipine (NORVASC) 10 MG tablet [id number] Yes Take 1 tablet (10 mg total) by mouth daily. Plotnikov, Evie Lacks, MD Taking Active   aspirin 81 MG EC tablet [id number] Yes Take 1 tablet (81 mg total) by mouth daily. Plotnikov, Evie Lacks, MD Taking Active   Blood Glucose Monitoring Suppl (ONETOUCH VERIO FLEX SYSTEM) w/Device Drucie Opitz [id number] Yes USE TO MONITOR BLOOD SUGAR  Plotnikov, Evie Lacks, MD Taking Active   carvedilol (COREG) 25 MG tablet [id number] Yes TAKE 1 TABLET BY MOUTH  TWICE DAILY WITH A MEAL Plotnikov, Evie Lacks, MD Taking Active   Cholecalciferol (VITAMIN D3) 50 MCG (2000 UT) capsule [id number] Yes Take 1 capsule (2,000 Units total) by mouth daily. Plotnikov, Evie Lacks, MD Taking Active   colchicine 0.6 MG tablet [id number] Yes Take 1 tablet (0.6 mg total) by mouth 2 (two) times daily as needed. Marrian Salvage, FNP Taking Active   Cyanocobalamin (VITAMIN B-12) 1000 MCG SUBL [id number] Yes Place 1 tablet (1,000 mcg total) under the tongue daily. Plotnikov, Evie Lacks, MD Taking Active   cyclobenzaprine (FLEXERIL) 10 MG tablet [id number] Yes Take 1 tablet (10 mg total) by mouth at bedtime. Plotnikov, Evie Lacks, MD Taking Active   docusate sodium (COLACE) 100 MG capsule [id number] Yes Take 1 capsule (100 mg total) by mouth 2 (two) times daily. Donne Hazel, MD Taking Active Self           Med Note Olena Heckle, [facility]   Tue Jun 03, 2018  9:50 AM)    famotidine (PEPCID) 40 MG tablet [id number] Yes Take 1 tablet (40 mg total) by mouth daily. Plotnikov, Evie Lacks, MD Taking Active   ferrous sulfate 325 (65 FE) MG tablet [id number]  Take 1 tablet (325 mg total) by mouth daily. Plotnikov, Evie Lacks, MD  Expired 04/22/20 2359   furosemide (LASIX) 20 MG tablet [id number] Yes TAKE 2 TABLETS BY MOUTH IN  THE MORNING Plotnikov, Evie Lacks, MD Taking Active   glucose blood (ONETOUCH VERIO) test strip [id number] Yes CHECK BLOOD SUGAR 1 TO 2  TIMES DAILY AS DIRECTED Plotnikov, Evie Lacks, MD Taking Active   hydrALAZINE (APRESOLINE) 50 MG tablet [id number] Yes TAKE 1 TABLET BY MOUTH 3  TIMES DAILY Plotnikov, Evie Lacks, MD Taking Active   irbesartan (AVAPRO) 150 MG tablet [id number] Yes Take 0.5 tablets (75 mg total) by mouth daily. Plotnikov, Evie Lacks, MD Taking Active   Lancets (ONETOUCH DELICA PLUS [id number]) [place] [id number] Yes USE AS DIRECTED Plotnikov, Evie Lacks, MD Taking  Active   metFORMIN (GLUCOPHAGE-XR) 750 MG 24 hr tablet [id number] Yes TAKE 1 TABLET BY MOUTH  DAILY WITH  BREAKFAST Plotnikov, Evie Lacks, MD Taking Active   omeprazole (PRILOSEC) 40 MG capsule [id number] Yes TAKE 1 CAPSULE BY MOUTH  DAILY Plotnikov, Evie Lacks, MD Taking Active   ondansetron (ZOFRAN) 4 MG tablet [id number] Yes Take 1 tablet (4 mg total) by mouth every 8 (eight) hours as needed for nausea or vomiting. Plotnikov, Evie Lacks, MD Taking Active   pravastatin (PRAVACHOL) 80 MG tablet [id number] Yes TAKE 1 TABLET BY MOUTH AT  BEDTIME Plotnikov, Evie Lacks, MD Taking Active   tadalafil (ADCIRCA/CIALIS) 20 MG tablet [id number] Yes Take 20 mg by mouth as needed for erectile dysfunction. [provider] Taking Active Self  torsemide (DEMADEX) 100 MG tablet [id number] Yes Take 0.5 tablets (50 mg total) by mouth daily. Plotnikov, Evie Lacks, MD Taking Active             Patient Active Problem List   Diagnosis Date Noted   Elbow pain, left 12/05/2020   Constipation 12/05/2020   Atherosclerosis of aorta ([place]) 12/05/2020   Chest pain, atypical 12/05/2020   CTS (carpal tunnel syndrome) 03/24/2020   Grief 03/24/2020   Cholelithiasis 09/16/2019   Nausea 09/02/2019   Weight loss 09/02/2019   Elevated troponin 01/12/2019   Hypertensive urgency 01/10/2019   Well adult exam 10/20/2018   Balanitis 10/20/2018   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/27/2017   Adenoma of stomach 12/25/2017   Abnormal findings on esophagogastroduodenoscopy (EGD) 12/25/2017   Sebaceous cyst 10/04/2017   Iron deficiency anemia    Benign neoplasm of sigmoid colon    Benign neoplasm of cecum    Benign neoplasm of transverse colon    Positive occult stool blood test 09/24/2017   CKD (chronic kidney disease), stage III (HCC) 09/24/2017   Chronic diastolic CHF (congestive heart failure) ([place]) 09/24/2017   Generalized weakness 07/22/2015   Gait disorder 03/18/2014   Low back pain 01/12/2014    History of colon polyps 12/17/2013   DM (diabetes mellitus), type 2 with peripheral vascular complications ([place]) 26/41/5830   Diarrhea 10/08/2012   Edema 10/08/2011   DOE (dyspnea on exertion) 06/29/2011   LATERAL EPICONDYLITIS, LEFT 02/20/2010   TOBACCO USE, QUIT 02/23/2009   Cough 02/16/2009   DECREASED HEARING 10/15/2008   ERECTILE DYSFUNCTION 04/30/2008   CAD (coronary artery disease) 04/30/2008   COPD (chronic obstructive pulmonary disease) ([place]) 05/21/2007   Anxiety state 05/20/2007   Insomnia 03/04/2007   Dyslipidemia 02/01/2007   Gout 02/01/2007   Essential hypertension 02/01/2007   GERD 02/01/2007   Osteoarthritis 02/01/2007    Immunization History  Administered Date(s) Administered   Fluad Quad(high Dose 65+) 01/27/2020   Influenza Split 03/15/2011, 02/08/2012   Influenza Whole 03/04/2006, 03/04/2007, 03/15/2009, 12/14/2009   Influenza, High Dose Seasonal PF 03/16/2013, 12/14/2014, 01/30/2016, 12/31/2016, 01/08/2018, 01/06/2019   Influenza,inj,Quad PF,6+ Mos 12/17/2013   PFIZER(Purple Top)SARS-COV-2 Vaccination 05/14/2019, 06/04/2019, 01/27/2020   Pneumococcal Conjugate-13 09/16/2013   Pneumococcal Polysaccharide-23 12/20/2004, 03/15/2011   Tetanus 11/24/2013    Conditions to be addressed/monitored:  {USCCMDZASSESSMENTOPTIONS:23563}  There are no care plans that you recently modified to display for this patient.     Medication Assistance: {MEDASSISTANCEINFO:25044}  Compliance/Adherence/Medication fill history: Care Gaps: ***  Patient's preferred pharmacy is:  [facility] [place] [place] [address] Phone: [phone] Fax: [phone]  [facility]  ([place], [address]  [place] [address] Phone: [phone] Fax: [phone]   Uses pill box? {Yes or If no,  why not?:20788} Pt endorses ***% compliance  Care Plan and Follow Up Patient Decision:  {FOLLOWUP:24991}  Plan: {CM FOLLOW UP OJZB:30104}  ***  Current Barriers:  {pharmacybarriers:24917}  Pharmacist Clinical Goal(s):  Patient will {PHARMACYGOALCHOICES:24921} through collaboration with PharmD and provider.   Interventions: 1:1 collaboration with Plotnikov, Evie Lacks, MD regarding development and update of comprehensive plan of care as evidenced by provider attestation and co-signature Inter-disciplinary care team collaboration (see longitudinal plan of care) Comprehensive medication review performed; medication list updated in electronic medical record  Hypertension (BP goal <140/90) -{US controlled/uncontrolled:25276} -Current treatment: Irbesartan 183m - 1/2 tablet daily  Carvedilol 256m- 1 tablet twice daily  Hydralazine 5058m 1 tablet 3 times daily  Amlodipine 32m93m1 tablet daily  Furosemide 20mg9m tablets daily  Torsemide 100mg 46m2 tablet daily  Potassium Chloride 32mEq 53mtablet daily  Lab Results  Component Value Date   K 3.2 (L) 11/29/2020    Has not been rechecked since starting K supplementation -Medications previously tried: benazepril, spironolactone,   -Current home readings: *** BP Readings from Last 3 Encounters:  12/05/20 128/62  11/29/20 130/66  10/12/20 (!) 122/58  -Current dietary habits: *** -Current exercise habits: *** -{ACTIONS;DENIES/REPORTS:21021675::"Denies"} hypotensive/hypertensive symptoms -Educated on {CCM BP Counseling:25124} -Counseled to monitor BP at home ***, document, and provide log at future appointments -{CCMPHARMDINTERVENTION:25122}  Hyperlipidemia: (LDL goal < 70) -Controlled Lab Results  Component Value Date   LDLCALC 75 07/21/2019  -Current treatment: Pravastatin 80mg - 54mblet daily  Aspirin 81mg - 175mlet daily  -Medications previously tried: ***  -Current dietary patterns: *** -Current exercise habits:  *** -Educated on {CCM HLD Counseling:25126} -{CCMPHARMDINTERVENTION:25122}  Diabetes (A1c goal <7%) -Controlled Lab Results  Component Value Date   HGBA1C 6.7 (H) 10/12/2020  -Current medications: Metformin XR 750mg - 1 58met daily  - due to eGFR recently - would recommend for SU use - glipizide XL 2.5mg daily 46mollow up to increase as needed  -Medications previously tried: ***  -Current home glucose readings fasting glucose: *** post prandial glucose: *** -{ACTIONS;DENIES/REPORTS:21021675::"Denies"} hypoglycemic/hyperglycemic symptoms -Current meal patterns:  breakfast: ***  lunch: ***  dinner: *** snacks: *** drinks: *** -Current exercise: *** -Educated on {CCM DM COUNSELING:25123} -Counseled to check feet daily and get yearly eye exams -{CCMPHARMDINTERVENTION:25122}  GERD (Goal: Prevention/ treatment of acid reflux) -{US controlled/uncontrolled:25276} -Current treatment  Omeprazole 40mg - 1 ca42me daily  Famotidine 40mg - 1 tab76mdaily  -Medications previously tried: ***  -{CCMPHARMDINTERVENTION:25122}  Gout (Goal: treatment of gout attacks) -{US controlled/uncontrolled:25276} -Current treatment  Colchicine 0.6mg - 1 table35mwice daily as needed  -Medications previously tried: ***  -{CCMPHARMDINTERVENTION:25122}   Chronic Kidney Disease (Goal: Prevention of disease progression) -{US controlled/uncontrolled:25276} -Last eGFR: 20 mL/min  -Current treatment  *** -Medications previously tried: ***  -{CCMPHARMDINTERVENTION:25122}   Health Maintenance -Vaccine gaps: *** -Current therapy:  Vitamin D3 2000units - 1 tablet daily  Vitamin B12 1000mcg - 1 tabl42maily  Ferrous Sulfate 325mg - 1 tablet46mly  Tadalafil 20mg - 1 tablet 72my as needed  Docusate 100mg - 1 capsule 39me daily   -Educated on {ccm supplement counseling:25128} -{CCM Patient satisfied:25129} -{CCMPHARMDINTERVENTION:25122}  Patient Goals/Self-Care Activities Patient will:  -  {pharmacypatientgoals:24919}  Follow Up Plan: {CM FOLLOW UP PLAN:22241}  CurreUEBV:13685}ep = 24 minutes

## 2020-12-23 ENCOUNTER — Ambulatory Visit: Payer: Medicare Other

## 2021-01-01 NOTE — Progress Notes (Signed)
Office Visit    Patient Name: Harold Pittman Date of Encounter: 01/02/2021  PCP:  Cassandria Anger, MD   [place]  Cardiologist:  Sherren Mocha, MD  Advanced Practice Provider:  No care team member to display Electrophysiologist:  None      Chief Complaint    Harold Pittman is a 80 y.o. male with a hx of  CAD s/p CABG in 1997, HTN, HLD, chronic dyspnea, pulmonary hypertension presents today for follow up after ED visit for chest pin   Past Medical History    Past Medical History:  Diagnosis Date   Anemia    low iron   Anxiety state, unspecified    Blood in stool    Chronic airway obstruction, not elsewhere classified    Coronary artery disease    Coronary atherosclerosis of artery bypass graft    Dyspnea    ED (erectile dysfunction)    Esophageal reflux    Gout, unspecified    Osteoarthrosis, unspecified whether generalized or localized, unspecified site    Other and unspecified hyperlipidemia    Other specified disorder of rectum and anus    Personal history of tobacco use, presenting hazards to health    Sleep apnea    does not use cpap   Type II or unspecified type diabetes mellitus without mention of complication, not stated as uncontrolled    Unspecified essential hypertension    Past Surgical History:  Procedure Laterality Date   BIOPSY  09/26/2017   Procedure: BIOPSY;  Surgeon: Ladene Artist, MD;  Location: [place];  Service: Endoscopy;;   BIOPSY  02/03/2018   Procedure: BIOPSY;  Surgeon: Irving Copas., MD;  Location: [facility] ENDOSCOPY;  Service: Gastroenterology;;   COLONOSCOPY WITH PROPOFOL N/A 09/26/2017   Procedure: COLONOSCOPY WITH PROPOFOL;  Surgeon: Ladene Artist, MD;  Location: [facility] ENDOSCOPY;  Service: Endoscopy;  Laterality: N/A;   CORONARY ARTERY BYPASS GRAFT  41   LIMA to LAD, sequential saphenous vein graft to the first and second diagonal, sequential saphenous vein graft to the intermediate OM and  circumflex and SVG to RCA   ESOPHAGOGASTRODUODENOSCOPY (EGD) WITH PROPOFOL N/A 09/26/2017   Procedure: ESOPHAGOGASTRODUODENOSCOPY (EGD) WITH PROPOFOL;  Surgeon: Ladene Artist, MD;  Location: [facility] ENDOSCOPY;  Service: Endoscopy;  Laterality: N/A;   ESOPHAGOGASTRODUODENOSCOPY (EGD) WITH PROPOFOL N/A 02/03/2018   Procedure: ESOPHAGOGASTRODUODENOSCOPY (EGD);  Surgeon: Irving Copas., MD;  Location: [place];  Service: Gastroenterology;  Laterality: N/A;   KNEE SURGERY     BILATERAL   POLYPECTOMY  09/26/2017   Procedure: POLYPECTOMY;  Surgeon: Ladene Artist, MD;  Location: [place];  Service: Endoscopy;;   [place] INJECTION  02/03/2018   Procedure: SUBMUCOSAL INJECTION;  Surgeon: Irving Copas., MD;  Location: [facility] ENDOSCOPY;  Service: Gastroenterology;;    Allergies  Allergies  Allergen Reactions   Benazepril Shortness Of Breath    Cough, wheezing    History of Present Illness    Harold Pittman is a 81 y.o. male with a hx of CAD s/p CABG in 1997, HTN, HLD, chronic dyspnea, pulmonary hypertension last seen 05/2018 by Richardson Dopp, PA.  He was seen 01/2018 noting dypsnea and LE edema. Echocardiogram with normal LVEF and mild diastolic dysfunction. Myoview with no ischemia.   He was last seen 06/03/2018 doing well from cardiac perspective and no changes were made.   Seen in the ED 11/29/20 due to left sided chest  pain which initiated at rest and was persistent. CBC unremarkable. BMP with mildly low potassium, elevated BUN, stable creatinine. Troponin negative x2. His potassium was repleted. CXR with no acute findings. He was recommended to try antacid or Tylenol for pain as deemed noncardiac.   He presents today for follow up with his daughter Harold Pittman. We reviewed his ED visit and findings in detail. Reports no shortness of breath at rest and stable mild dyspnea on exertion with more than usual activity. Reports no chest pain, pressure, or  tightness. No edema, orthopnea, PND. Reports no palpitations.  Does note recent issues with gout for which he takes PRN Colchicine, discussed gout friendly diet. Endorses eating lots of red meat and fried foods. He is taking both Furosemide and Torsemide, discussed to take Torsemide only.   EKGs/Labs/Other Studies Reviewed:   The following studies were reviewed today: Echo 01/30/18 EF 60-65, no RWMA, Gr 1 DD, mild MR, normal RVSF, mod TR, PASP 35 (mild increase)   Myoview 01/30/18 Normal stress nuclear study with no ischemia or infarction.  Gated ejection fraction 63% with normal wall motion.   Echo 07/23/15 Mild conc LVH, EF 60-65, prob basal inf AK, normal diastolic function, mildly calc AV leaflets, mild MR, PASP 45 (mod pulmonary HTN)   Renal Art [place] 05/02/2011 Normal caliber abdominal aorta. Mod stenosis of celiac axis Normal renal arteries bilat Multiple R kidney cysts    EKG:  No EKG today.   Recent Labs: 11/29/2020: ALT 16; BUN 44; Creatinine, Ser 3.04; Hemoglobin 10.7; Platelets 239; Potassium 3.2; Sodium 140  Recent Lipid Panel    Component Value Date/Time   CHOL 120 07/21/2019 1016   TRIG 68.0 07/21/2019 1016   TRIG 109 01/28/2006 0841   HDL 31.70 (L) 07/21/2019 1016   CHOLHDL 4 07/21/2019 1016   VLDL 13.6 07/21/2019 1016   LDLCALC 75 07/21/2019 1016     Home Medications   Current Meds  Medication Sig   amLODipine (NORVASC) 10 MG tablet Take 1 tablet (10 mg total) by mouth daily.   aspirin 81 MG EC tablet Take 1 tablet (81 mg total) by mouth daily.   Blood Glucose Monitoring Suppl (ONETOUCH VERIO FLEX SYSTEM) w/Device KIT USE TO MONITOR BLOOD SUGAR   carvedilol (COREG) 25 MG tablet TAKE 1 TABLET BY MOUTH  TWICE DAILY WITH A MEAL   Cholecalciferol (VITAMIN D3) 50 MCG (2000 UT) capsule Take 1 capsule (2,000 Units total) by mouth daily.   colchicine 0.6 MG tablet Take 1 tablet (0.6 mg total) by mouth 2 (two) times daily as needed.   Cyanocobalamin (VITAMIN B-12)  1000 MCG SUBL Place 1 tablet (1,000 mcg total) under the tongue daily.   cyclobenzaprine (FLEXERIL) 10 MG tablet Take 1 tablet (10 mg total) by mouth at bedtime.   docusate sodium (COLACE) 100 MG capsule Take 1 capsule (100 mg total) by mouth 2 (two) times daily.   famotidine (PEPCID) 40 MG tablet Take 1 tablet (40 mg total) by mouth daily.   glucose blood (ONETOUCH VERIO) test strip CHECK BLOOD SUGAR 1 TO 2  TIMES DAILY AS DIRECTED   hydrALAZINE (APRESOLINE) 50 MG tablet TAKE 1 TABLET BY MOUTH 3  TIMES DAILY   irbesartan (AVAPRO) 150 MG tablet Take 0.5 tablets (75 mg total) by mouth daily.   Lancets (ONETOUCH DELICA PLUS [id number]) MISC USE AS DIRECTED   metFORMIN (GLUCOPHAGE-XR) 750 MG 24 hr tablet TAKE 1 TABLET BY MOUTH  DAILY WITH BREAKFAST   omeprazole (PRILOSEC) 40 MG capsule TAKE  1 CAPSULE BY MOUTH  DAILY   ondansetron (ZOFRAN) 4 MG tablet Take 1 tablet (4 mg total) by mouth every 8 (eight) hours as needed for nausea or vomiting.   potassium chloride (KLOR-CON) 10 MEQ tablet TAKE 1 TABLET(10 MEQ) BY MOUTH DAILY   pravastatin (PRAVACHOL) 80 MG tablet TAKE 1 TABLET BY MOUTH AT  BEDTIME   tadalafil (ADCIRCA/CIALIS) 20 MG tablet Take 20 mg by mouth as needed for erectile dysfunction.   torsemide (DEMADEX) 100 MG tablet Take 0.5 tablets (50 mg total) by mouth daily. (Patient taking differently: Take 100 mg by mouth daily.)   [DISCONTINUED] furosemide (LASIX) 20 MG tablet TAKE 2 TABLETS BY MOUTH IN  THE MORNING     Review of Systems      All other systems reviewed and are otherwise negative except as noted above.  Physical Exam    VS:  BP (!) 118/58 (BP Location: Left Arm, Patient Position: Sitting, Cuff Size: Normal)   Pulse 66   Ht 6' 1"  (1.854 m)   Wt 171 lb 14.4 oz (78 kg)   SpO2 98%   BMI 22.68 kg/m  , BMI Body mass index is 22.68 kg/m.  Wt Readings from Last 3 Encounters:  01/02/21 171 lb 14.4 oz (78 kg)  12/05/20 174 lb (78.9 kg)  10/12/20 176 lb 9.6 oz (80.1 kg)      GEN: Well nourished, well developed, in no acute distress. HEENT: normal. Neck: Supple, no JVD, carotid bruits, or masses. Cardiac: RRR, no murmurs, rubs, or gallops. No clubbing, cyanosis, edema.  Radials/PT 2+ and equal bilaterally.  Respiratory:  Respirations regular and unlabored, clear to auscultation bilaterally. GI: Soft, nontender, nondistended. MS: No deformity or atrophy. Skin: Warm and dry, no rash. Neuro:  Strength and sensation are intact. Psych: Normal affect.  Assessment & Plan    CAD - Stable with no anginal symptoms. No indication for ischemic evaluation.  Recent ED visit with noncardiac chest pain, likely musculoskeletal. GDMT includes aspirin, pravastatin, carvedilol. Heart healthy diet and regular cardiovascular exercise encouraged.    HTN - BP well controlled. Continue current antihypertensive regimen.    HLD - Continue Pravastatin 73m daily  Hypokalemia - Repeat BMP today for monitoring. Hypokalemia likely caused by taking both Furosemide/Torsemide as well as diarrhea in the setting of colchicine use. Called [facility] to discontinue Furosemide.   Disposition: Follow up in 6 month(s) with Dr. CBurt Knackor APP.  Signed, CLoel Dubonnet NP 01/02/2021, 10:17 AM [place]

## 2021-01-02 ENCOUNTER — Encounter (HOSPITAL_BASED_OUTPATIENT_CLINIC_OR_DEPARTMENT_OTHER): Payer: Self-pay | Admitting: Family

## 2021-01-02 ENCOUNTER — Other Ambulatory Visit: Payer: Self-pay

## 2021-01-02 ENCOUNTER — Ambulatory Visit (HOSPITAL_BASED_OUTPATIENT_CLINIC_OR_DEPARTMENT_OTHER): Payer: Medicare Other | Admitting: Family

## 2021-01-02 VITALS — BP 118/58 | HR 66 | Ht 73.0 in | Wt 171.9 lb

## 2021-01-02 DIAGNOSIS — E876 Hypokalemia: Secondary | ICD-10-CM | POA: Diagnosis not present

## 2021-01-02 DIAGNOSIS — I25118 Atherosclerotic heart disease of native coronary artery with other forms of angina pectoris: Secondary | ICD-10-CM | POA: Diagnosis not present

## 2021-01-02 DIAGNOSIS — I10 Essential (primary) hypertension: Secondary | ICD-10-CM

## 2021-01-02 DIAGNOSIS — E785 Hyperlipidemia, unspecified: Secondary | ICD-10-CM

## 2021-01-02 NOTE — Patient Instructions (Addendum)
Medication Instructions:  Your physician has recommended you make the following change in your medication:   STOP Furosemide   *This is a duplicate fluid pill. You should only take Torsemide 1 tablet daily.  *If you need a refill on your cardiac medications before your next appointment, please call your pharmacy*   Lab Work: Your physician recommends that you return for lab work today: BMP  If you have labs (blood work) drawn today and your tests are completely normal, you will receive your results only by: [place] (if you have MyChart) OR A paper copy in the mail If you have any lab test that is abnormal or we need to change your treatment, we will call you to review the results.   Testing/Procedures: None ordered today.    Follow-Up: At [facility], you and your health needs are our priority.  As part of our continuing mission to provide you with exceptional heart care, we have created designated Provider Care Teams.  These Care Teams include your primary Cardiologist (physician) and Advanced Practice Providers (APPs -  Physician Assistants and Nurse Practitioners) who all work together to provide you with the care you need, when you need it.  We recommend signing up for the patient portal called "MyChart".  Sign up information is provided on this After Visit Summary.  MyChart is used to connect with patients for Virtual Visits (Telemedicine).  Patients are able to view lab/test results, encounter notes, upcoming appointments, etc.  Non-urgent messages can be sent to your provider as well.   To learn more about what you can do with MyChart, go to [email or web address].    Your next appointment:   6 month(s)  The format for your next appointment:   In Person  Provider:   You may see Sherren Mocha, MD or one of the following Advanced Practice Providers on your designated Care Team:   Richardson Dopp, PA-C [place], [place]  Other Instructions  Heart Healthy Diet  Recommendations: A low-salt diet is recommended. Meats should be grilled, baked, or boiled. Avoid fried foods. Focus on lean protein sources like fish or chicken with vegetables and fruits. The American Heart Association is a Microbiologist!    Exercise recommendations: The American Heart Association recommends 150 minutes of moderate intensity exercise weekly. Try 30 minutes of moderate intensity exercise 4-5 times per week. This could include walking, jogging, or swimming.   Gout Gout is painful swelling of your joints. Gout is a type of arthritis. It is caused by having too much uric acid in your body. Uric acid is a chemical that is made when your body breaks down substances called purines. If your body has too much uric acid, sharp crystals can form and build up in your joints. This causes pain and swelling. Gout attacks can happen quickly and be very painful (acute gout). Over time, the attacks can affect more joints and happen more often (chronic gout). What are the causes? Too much uric acid in your blood. This can happen because: Your kidneys do not remove enough uric acid from your blood. Your body makes too much uric acid. You eat too many foods that are high in purines. These foods include organ meats, some seafood, and beer. Trauma or stress. What increases the risk? Having a family history of gout. Being male and middle-aged. Being male and having gone through menopause. Being very overweight (obese). Drinking alcohol, especially beer. Not having enough water in the body (being dehydrated). Losing weight too quickly.  Having an organ transplant. Having lead poisoning. Taking certain medicines. Having kidney disease. Having a skin condition called psoriasis. What are the signs or symptoms? An attack of acute gout usually happens in just one joint. The most common place is the big toe. Attacks often start at night. Other joints that may be affected include joints of the  feet, ankle, knee, fingers, wrist, or elbow. Symptoms of an attack may include: Very bad pain. Warmth. Swelling. Stiffness. Shiny, red, or purple skin. Tenderness. The affected joint may be very painful to touch. Chills and fever. Chronic gout may cause symptoms more often. More joints may be involved. You may also have white or yellow lumps (tophi) on your hands or feet or in other areas near your joints. How is this treated? Treatment for this condition has two phases: treating an acute attack and preventing future attacks. Acute gout treatment may include: NSAIDs. Steroids. These are taken by mouth or injected into a joint. Colchicine. This medicine relieves pain and swelling. It can be given by mouth or through an IV tube. Preventive treatment may include: Taking small doses of NSAIDs or colchicine daily. Using a medicine that reduces uric acid levels in your blood. Making changes to your diet. You may need to see a food expert (dietitian) about what to eat and drink to prevent gout. Follow these instructions at home: During a gout attack  If told, put ice on the painful area: Put ice in a plastic bag. Place a towel between your skin and the bag. Leave the ice on for 20 minutes, 2-3 times a day. Raise (elevate) the painful joint above the level of your heart as often as you can. Rest the joint as much as possible. If the joint is in your leg, you may be given crutches. Follow instructions from your doctor about what you cannot eat or drink. Avoiding future gout attacks Eat a low-purine diet. Avoid foods and drinks such as: Liver. Kidney. Anchovies. Asparagus. Herring. Mushrooms. Mussels. Beer. Stay at a healthy weight. If you want to lose weight, talk with your doctor. Do not lose weight too fast. Start or continue an exercise plan as told by your doctor. Eating and drinking Drink enough fluids to keep your pee (urine) pale yellow. If you drink alcohol: Limit how much  you use to: 0-1 drink a day for women. 0-2 drinks a day for men. Be aware of how much alcohol is in your drink. In the [place] one drink equals one 12 oz bottle of beer (355 mL), one 5 oz glass of wine (148 mL), or one 1 oz glass of hard liquor (44 mL). General instructions Take over-the-counter and prescription medicines only as told by your doctor. Do not drive or use heavy machinery while taking prescription pain medicine. Return to your normal activities as told by your doctor. Ask your doctor what activities are safe for you. Keep all follow-up visits as told by your doctor. This is important. Contact a doctor if: You have another gout attack. You still have symptoms of a gout attack after 10 days of treatment. You have problems (side effects) because of your medicines. You have chills or a fever. You have burning pain when you pee (urinate). You have pain in your lower back or belly. Get help right away if: You have very bad pain. Your pain cannot be controlled. You cannot pee. Summary Gout is painful swelling of the joints. The most common site of pain is the big toe, but  it can affect other joints. Medicines and avoiding some foods can help to prevent and treat gout attacks. This information is not intended to replace advice given to you by your health care provider. Make sure you discuss any questions you have with your health care provider. Document Revised: 10/16/2017 Document Reviewed: 10/16/2017 Elsevier Patient Education  [place].

## 2021-01-03 LAB — BASIC METABOLIC PANEL
BUN/Creatinine Ratio: 16 (ref 10–24)
BUN: 49 mg/dL — ABNORMAL HIGH (ref 8–27)
CO2: 26 mmol/L (ref 20–29)
Calcium: 10.3 mg/dL — ABNORMAL HIGH (ref 8.6–10.2)
Chloride: 99 mmol/L (ref 96–106)
Creatinine, Ser: 3.02 mg/dL — ABNORMAL HIGH (ref 0.76–1.27)
Glucose: 133 mg/dL — ABNORMAL HIGH (ref 70–99)
Potassium: 4.3 mmol/L (ref 3.5–5.2)
Sodium: 143 mmol/L (ref 134–144)
eGFR: 20 mL/min/{1.73_m2} — ABNORMAL LOW (ref >59)

## 2021-01-19 DIAGNOSIS — E1122 Type 2 diabetes mellitus with diabetic chronic kidney disease: Secondary | ICD-10-CM | POA: Diagnosis not present

## 2021-01-19 DIAGNOSIS — I129 Hypertensive chronic kidney disease with stage 1 through stage 4 chronic kidney disease, or unspecified chronic kidney disease: Secondary | ICD-10-CM | POA: Diagnosis not present

## 2021-01-19 DIAGNOSIS — D631 Anemia in chronic kidney disease: Secondary | ICD-10-CM | POA: Diagnosis not present

## 2021-01-19 DIAGNOSIS — N1832 Chronic kidney disease, stage 3b: Secondary | ICD-10-CM | POA: Diagnosis not present

## 2021-01-19 DIAGNOSIS — N2581 Secondary hyperparathyroidism of renal origin: Secondary | ICD-10-CM | POA: Diagnosis not present

## 2021-01-19 DIAGNOSIS — M109 Gout, unspecified: Secondary | ICD-10-CM | POA: Diagnosis not present

## 2021-01-19 DIAGNOSIS — N189 Chronic kidney disease, unspecified: Secondary | ICD-10-CM | POA: Diagnosis not present

## 2021-01-19 DIAGNOSIS — J449 Chronic obstructive pulmonary disease, unspecified: Secondary | ICD-10-CM | POA: Diagnosis not present

## 2021-01-20 ENCOUNTER — Other Ambulatory Visit: Payer: Self-pay | Admitting: Nephrology

## 2021-01-20 DIAGNOSIS — N1832 Chronic kidney disease, stage 3b: Secondary | ICD-10-CM

## 2021-02-01 ENCOUNTER — Telehealth: Payer: Self-pay | Admitting: Hematology

## 2021-02-01 NOTE — Telephone Encounter (Signed)
Scheduled appt per 10/20 referral. Pt is aware of appt date and time.

## 2021-02-04 ENCOUNTER — Other Ambulatory Visit: Payer: Self-pay | Admitting: Internal Medicine

## 2021-02-08 ENCOUNTER — Ambulatory Visit
Admission: RE | Admit: 2021-02-08 | Discharge: 2021-02-08 | Disposition: A | Discharge status: Home / Self Care | Payer: Medicare Other | Source: Ambulatory Visit | Attending: Nephrology | Admitting: Nephrology

## 2021-02-08 DIAGNOSIS — N281 Cyst of kidney, acquired: Secondary | ICD-10-CM | POA: Diagnosis not present

## 2021-02-08 DIAGNOSIS — N1832 Chronic kidney disease, stage 3b: Secondary | ICD-10-CM

## 2021-02-13 ENCOUNTER — Inpatient Hospital Stay: Payer: Medicare Other | Attending: Hematology | Admitting: Hematology

## 2021-02-14 ENCOUNTER — Telehealth: Payer: Self-pay | Admitting: Hematology

## 2021-02-14 NOTE — Telephone Encounter (Signed)
Scheduled per sch msg. Called and left msg. Mailed printout  

## 2021-02-21 DIAGNOSIS — G5601 Carpal tunnel syndrome, right upper limb: Secondary | ICD-10-CM | POA: Diagnosis not present

## 2021-02-22 DIAGNOSIS — D4102 Neoplasm of uncertain behavior of left kidney: Secondary | ICD-10-CM | POA: Diagnosis not present

## 2021-03-01 ENCOUNTER — Other Ambulatory Visit: Payer: Self-pay | Admitting: Urology

## 2021-03-01 DIAGNOSIS — D4102 Neoplasm of uncertain behavior of left kidney: Secondary | ICD-10-CM

## 2021-03-13 ENCOUNTER — Other Ambulatory Visit: Payer: Self-pay

## 2021-03-13 ENCOUNTER — Inpatient Hospital Stay: Payer: Medicare Other

## 2021-03-13 ENCOUNTER — Inpatient Hospital Stay: Payer: Medicare Other | Attending: Hematology | Admitting: Hematology

## 2021-03-13 VITALS — BP 111/63 | HR 59 | Temp 97.3°F | Resp 20 | Wt 169.3 lb

## 2021-03-13 DIAGNOSIS — D472 Monoclonal gammopathy: Secondary | ICD-10-CM

## 2021-03-13 DIAGNOSIS — D631 Anemia in chronic kidney disease: Secondary | ICD-10-CM | POA: Insufficient documentation

## 2021-03-13 DIAGNOSIS — Z87891 Personal history of nicotine dependence: Secondary | ICD-10-CM | POA: Diagnosis not present

## 2021-03-13 DIAGNOSIS — D649 Anemia, unspecified: Secondary | ICD-10-CM | POA: Diagnosis not present

## 2021-03-13 DIAGNOSIS — N281 Cyst of kidney, acquired: Secondary | ICD-10-CM | POA: Diagnosis not present

## 2021-03-13 DIAGNOSIS — N189 Chronic kidney disease, unspecified: Secondary | ICD-10-CM | POA: Diagnosis not present

## 2021-03-13 LAB — CBC WITH DIFFERENTIAL/PLATELET
Abs Immature Granulocytes: 0.02 10*3/uL (ref 0.00–0.07)
Basophils Absolute: 0 10*3/uL (ref 0.0–0.1)
Basophils Relative: 0 %
Eosinophils Absolute: 0.2 10*3/uL (ref 0.0–0.5)
Eosinophils Relative: 3 %
HCT: 30.8 % — ABNORMAL LOW (ref 39.0–52.0)
Hemoglobin: 10.4 g/dL — ABNORMAL LOW (ref 13.0–17.0)
Immature Granulocytes: 0 %
Lymphocytes Relative: 30 %
Lymphs Abs: 1.4 10*3/uL (ref 0.7–4.0)
MCH: 28.7 pg (ref 26.0–34.0)
MCHC: 33.8 g/dL (ref 30.0–36.0)
MCV: 85.1 fL (ref 80.0–100.0)
Monocytes Absolute: 0.7 10*3/uL (ref 0.1–1.0)
Monocytes Relative: 13 %
Neutro Abs: 2.6 10*3/uL (ref 1.7–7.7)
Neutrophils Relative %: 54 %
Platelets: 202 10*3/uL (ref 150–400)
RBC: 3.62 MIL/uL — ABNORMAL LOW (ref 4.22–5.81)
RDW: 13.5 % (ref 11.5–15.5)
WBC: 4.9 10*3/uL (ref 4.0–10.5)
nRBC: 0 % (ref 0.0–0.2)

## 2021-03-13 LAB — CMP (CANCER CENTER ONLY)
ALT: 19 U/L (ref 0–44)
AST: 26 U/L (ref 15–41)
Albumin: 4.2 g/dL (ref 3.5–5.0)
Alkaline Phosphatase: 100 U/L (ref 38–126)
Anion gap: 14 (ref 5–15)
BUN: 47 mg/dL — ABNORMAL HIGH (ref 8–23)
CO2: 27 mmol/L (ref 22–32)
Calcium: 9.3 mg/dL (ref 8.9–10.3)
Chloride: 105 mmol/L (ref 98–111)
Creatinine: 3.14 mg/dL (ref 0.61–1.24)
GFR, Estimated: 19 mL/min — ABNORMAL LOW (ref >60)
Glucose, Bld: 102 mg/dL — ABNORMAL HIGH (ref 70–99)
Potassium: 3.3 mmol/L — ABNORMAL LOW (ref 3.5–5.1)
Sodium: 146 mmol/L — ABNORMAL HIGH (ref 135–145)
Total Bilirubin: 0.7 mg/dL (ref 0.3–1.2)
Total Protein: 7.3 g/dL (ref 6.5–8.1)

## 2021-03-13 LAB — LACTATE DEHYDROGENASE: LDH: 215 U/L — ABNORMAL HIGH (ref 98–192)

## 2021-03-13 LAB — SEDIMENTATION RATE: Sed Rate: 40 mm/hr — ABNORMAL HIGH (ref 0–16)

## 2021-03-13 NOTE — Progress Notes (Addendum)
Marland Kitchen   HEMATOLOGY/ONCOLOGY CONSULTATION NOTE  Date of Service: 03/13/2021  Patient Care Team: Cassandria Anger, MD as PCP - Cyndia Diver, MD as PCP - Cardiology (Cardiology) Michael Boston, MD as Consulting Physician (General Surgery) Mansouraty, Telford Nab., MD as Consulting Physician (Gastroenterology) Katy Fitch, Darlina Guys, MD as Consulting Physician (Ophthalmology) Delice Bison, Darnelle Maffucci, [facility] as Pharmacist (Pharmacist)  CHIEF COMPLAINTS/PURPOSE OF CONSULTATION:  Positive M spike  HISTORY OF PRESENTING ILLNESS:   Harold Pittman is a wonderful 80 y.o. male who has been referred to [place] by Dr Edrick Oh, MD for evaluation and management of evaluation of monoclonal paraproteinemia.  Patient is accompanied by his family members for this visit.  Patient has a history of coronary artery disease status post CABG in 1997, hypertension, diabetes tobacco abuse, BPH, gout, CHF with diastolic dysfunction, pulmonary hypertension who was recently evaluated by nephrology for his acute on chronic kidney disease.  He was noted to have a creatinine of 1.7 in April 2021 which had bumped up to 3.3 in July 2022 and was back down to 2.8 mg/dL on his last nephrology visit. His urinary sediment was noted to be inactive with no white blood cells or red blood cells and no significant proteinuria on UA. As part of his renal work-up he had a renal ultrasound in May 2021 which showed 2 complex cysts in the right kidney the largest being 4.8 cm in size multiseptated cyst in the midpole.  MRI evaluation of the kidney was recommended to rule out neoplasm. Patient notes he has recently seen urology and has an MRI ordered which is scheduled for 03/31/2021.  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  As part of his nephrology work-up he had SPEP on 01/19/2021 which showed M spike of 0.5 g/dL. IFE shows IgG monoclonal  protein with kappa light chain specificity UPEP showed no M spike.  Patient notes no new focal bone pains. No fevers no chills no night sweats no unexpected weight loss.  MEDICAL HISTORY:  Past Medical History:  Diagnosis Date   Anemia    low iron   Anxiety state, unspecified    Blood in stool    Chronic airway obstruction, not elsewhere classified    Coronary artery disease    Coronary atherosclerosis of artery bypass graft    Dyspnea    ED (erectile dysfunction)    Esophageal reflux    Gout, unspecified    Osteoarthrosis, unspecified whether generalized or localized, unspecified site    Other and unspecified hyperlipidemia    Other specified disorder of rectum and anus    Personal history of tobacco use, presenting hazards to health    Sleep apnea    does not use cpap   Type II or unspecified type diabetes mellitus without mention of complication, not stated as uncontrolled    Unspecified essential hypertension     SURGICAL HISTORY: Past Surgical History:  Procedure Laterality Date   BIOPSY  09/26/2017   Procedure: BIOPSY;  Surgeon: Ladene Artist, MD;  Location: [place];  Service: Endoscopy;;   BIOPSY  02/03/2018   Procedure: BIOPSY;  Surgeon: Irving Copas., MD;  Location: [facility] ENDOSCOPY;  Service: Gastroenterology;;   COLONOSCOPY WITH PROPOFOL N/A 09/26/2017   Procedure: COLONOSCOPY WITH PROPOFOL;  Surgeon: Ladene Artist, MD;  Location: [facility] ENDOSCOPY;  Service: Endoscopy;  Laterality: N/A;   CORONARY ARTERY BYPASS GRAFT  97   LIMA to  LAD, sequential saphenous vein graft to the first and second diagonal, sequential saphenous vein graft to the intermediate OM and circumflex and SVG to RCA   ESOPHAGOGASTRODUODENOSCOPY (EGD) WITH PROPOFOL N/A 09/26/2017   Procedure: ESOPHAGOGASTRODUODENOSCOPY (EGD) WITH PROPOFOL;  Surgeon: Ladene Artist, MD;  Location: [facility] ENDOSCOPY;  Service: Endoscopy;  Laterality: N/A;   ESOPHAGOGASTRODUODENOSCOPY (EGD) WITH PROPOFOL N/A  02/03/2018   Procedure: ESOPHAGOGASTRODUODENOSCOPY (EGD);  Surgeon: Irving Copas., MD;  Location: [place];  Service: Gastroenterology;  Laterality: N/A;   KNEE SURGERY     BILATERAL   POLYPECTOMY  09/26/2017   Procedure: POLYPECTOMY;  Surgeon: Ladene Artist, MD;  Location: [place];  Service: Endoscopy;;   [place] INJECTION  02/03/2018   Procedure: SUBMUCOSAL INJECTION;  Surgeon: Irving Copas., MD;  Location: [facility] ENDOSCOPY;  Service: Gastroenterology;;    SOCIAL HISTORY: Social History   Socioeconomic History   Marital status: Widowed    Spouse name: Not on file   Number of children: Not on file   Years of education: Not on file   Highest education level: Not on file  Occupational History   Occupation: RETIRED  Tobacco Use   Smoking status: Former   Smokeless tobacco: Never   Tobacco comments:    Stopped 1997  Vaping Use   Vaping Use: Never used  Substance and Sexual Activity   Alcohol use: No    Comment: Stopped 1997   Drug use: No   Sexual activity: Not Currently  Other Topics Concern   Not on file  Social History Narrative   Patient does not get regular exercise   Daily Caffeine use - 6      Wife decease 2005-06-20;       Social Determinants of Health   Financial Resource Strain: Not on file  Food Insecurity: Not on file  Transportation Needs: Not on file  Physical Activity: Not on file  Stress: Not on file  Social Connections: Not on file  Intimate Partner Violence: Not on file    FAMILY HISTORY: Family History  Problem Relation Age of Onset   Depression Sister    Heart disease Sister    Heart disease Mother 8       CAD   Mental illness Father        Alzheimer's   Hypertension Other    Coronary artery disease Other        Male 1st degree relative <50   Colon cancer Neg Hx    Stomach cancer Neg Hx    Esophageal cancer Neg Hx    Liver disease Neg Hx    Pancreatic cancer Neg Hx    Rectal cancer  Neg Hx     ALLERGIES:  is allergic to benazepril.  MEDICATIONS:  Current Outpatient Medications  Medication Sig Dispense Refill   amLODipine (NORVASC) 10 MG tablet Take 1 tablet (10 mg total) by mouth daily. 90 tablet 3   aspirin 81 MG EC tablet Take 1 tablet (81 mg total) by mouth daily. 100 tablet 3   Blood Glucose Monitoring Suppl (ONETOUCH VERIO FLEX SYSTEM) w/Device KIT USE TO MONITOR BLOOD SUGAR 1 kit 0   carvedilol (COREG) 25 MG tablet TAKE 1 TABLET BY MOUTH  TWICE DAILY WITH A MEAL 180 tablet 3   Cholecalciferol (VITAMIN D3) 50 MCG (2000 UT) capsule Take 1 capsule (2,000 Units total) by mouth daily. 100 capsule 3   colchicine 0.6 MG tablet Take 1 tablet (0.6 mg total)  by mouth 2 (two) times daily as needed. 30 tablet 0   Cyanocobalamin (VITAMIN B-12) 1000 MCG SUBL Place 1 tablet (1,000 mcg total) under the tongue daily. 100 tablet 3   cyclobenzaprine (FLEXERIL) 10 MG tablet Take 1 tablet (10 mg total) by mouth at bedtime. 90 tablet 3   docusate sodium (COLACE) 100 MG capsule Take 1 capsule (100 mg total) by mouth 2 (two) times daily. 10 capsule 0   famotidine (PEPCID) 40 MG tablet Take 1 tablet (40 mg total) by mouth daily. 90 tablet 3   ferrous sulfate 325 (65 FE) MG tablet Take 1 tablet (325 mg total) by mouth daily. 90 tablet 3   glucose blood (ONETOUCH VERIO) test strip CHECK BLOOD SUGAR 1 TO 2  TIMES DAILY AS DIRECTED 200 strip 3   hydrALAZINE (APRESOLINE) 50 MG tablet TAKE 1 TABLET BY MOUTH 3  TIMES DAILY 270 tablet 3   HYDROcodone-acetaminophen (NORCO) 5-325 MG tablet Take 1 tablet by mouth every 6 (six) hours as needed for moderate pain. (Patient not taking: Reported on 01/02/2021) 20 tablet 0   irbesartan (AVAPRO) 150 MG tablet Take 0.5 tablets (75 mg total) by mouth daily. 45 tablet 3   Lancets (ONETOUCH DELICA PLUS [id number]) MISC USE AS DIRECTED 200 each 3   metFORMIN (GLUCOPHAGE-XR) 750 MG 24 hr tablet TAKE 1 TABLET BY MOUTH  DAILY WITH BREAKFAST 90 tablet 3   omeprazole  (PRILOSEC) 40 MG capsule TAKE 1 CAPSULE BY MOUTH  DAILY 90 capsule 3   ondansetron (ZOFRAN) 4 MG tablet Take 1 tablet (4 mg total) by mouth every 8 (eight) hours as needed for nausea or vomiting. 21 tablet 1   potassium chloride (KLOR-CON) 10 MEQ tablet TAKE 1 TABLET(10 MEQ) BY MOUTH DAILY 7 tablet 0   pravastatin (PRAVACHOL) 80 MG tablet TAKE 1 TABLET BY MOUTH AT  BEDTIME 90 tablet 3   tadalafil (ADCIRCA/CIALIS) 20 MG tablet Take 20 mg by mouth as needed for erectile dysfunction.     torsemide (DEMADEX) 100 MG tablet Take 0.5 tablets (50 mg total) by mouth daily. (Patient taking differently: Take 100 mg by mouth daily.) 90 tablet 3   No current facility-administered medications for this visit.    REVIEW OF SYSTEMS:   .10 Point review of Systems was done is negative except as noted above.  PHYSICAL EXAMINATION: ECOG PERFORMANCE STATUS: 2 - Symptomatic, <50% confined to bed  . Vitals:   03/13/21 1108  BP: 111/63  Pulse: (!) 59  Resp: 20  Temp: (!) 97.3 F (36.3 C)  SpO2: 98%   Filed Weights   03/13/21 1108  Weight: 169 lb 4.8 oz (76.8 kg)   .Body mass index is 22.34 kg/m.  GENERAL:alert, in no acute distress and comfortable SKIN: no acute rashes, no significant lesions EYES: conjunctiva are pink and non-injected, sclera anicteric OROPHARYNX: MMM, no exudates, no oropharyngeal erythema or ulceration NECK: supple, no JVD LYMPH:  no palpable lymphadenopathy in the cervical, axillary or inguinal regions LUNGS: clear to auscultation b/l with normal respiratory effort HEART: regular rate & rhythm ABDOMEN:  normoactive bowel sounds , non tender, not distended. Extremity: no pedal edema PSYCH: alert & oriented x 3 with fluent speech NEURO: no focal motor/sensory deficits  LABORATORY DATA:  I have reviewed the data as listed  . CBC Latest Ref Rng & Units 03/13/2021 11/29/2020 11/18/2019  WBC 4.0 - 10.5 K/uL 4.9 6.4 5.5  Hemoglobin 13.0 - 17.0 g/dL 10.4(L) 10.7(L) 11.3(L)   Hematocrit 39.0 - 52.0 %  30.8(L) 32.6(L) 34.1(L)  Platelets 150 - 400 K/uL 202 239 210    . CMP Latest Ref Rng & Units 03/13/2021 01/02/2021 11/29/2020  Glucose 70 - 99 mg/dL 102(H) 133(H) 182(H)  BUN 8 - 23 mg/dL 47(H) 49(H) 44(H)  Creatinine 0.61 - 1.24 mg/dL 3.14(HH) 3.02(H) 3.04(H)  Sodium 135 - 145 mmol/L 146(H) 143 140  Potassium 3.5 - 5.1 mmol/L 3.3(L) 4.3 3.2(L)  Chloride 98 - 111 mmol/L 105 99 102  CO2 22 - 32 mmol/L 27 26 29   Calcium 8.9 - 10.3 mg/dL 9.3 10.3(H) 9.5  Total Protein 6.5 - 8.1 g/dL 7.3 - 6.9  Total Bilirubin 0.3 - 1.2 mg/dL 0.7 - 0.6  Alkaline Phos 38 - 126 U/L 100 - 75  AST 15 - 41 U/L 26 - 21  ALT 0 - 44 U/L 19 - 16     RADIOGRAPHIC STUDIES: I have personally reviewed the radiological images as listed and agreed with the findings in the report. No results found.  ASSESSMENT & PLAN:   80 year old male with  #1  IgG kappa monoclonal paraproteinemia Patient has an M spike of 0.5 g/dL on 01/19/2021. This was found in the context of work-up for chronic kidney disease. Patient does not have significant proteinuria as per urine analysis and so overt renal amyloidosis is less likely based on the time course of his chronic kidney disease and lack of proteinuria. Also patient has multiple other medical comorbidities that explain his chronic kidney disease including hypertension, diabetes, cardiorenal syndrome, gout with possibility of uric acid nephropathy, renal cysts etc..  PLAN -I discussed the patient's medical history and background medical information as well as pattern of renal involvement in detail. -We discussed the context of his monoclonal paraproteinemia and the potential differential diagnosis and considerations to rule out a plasma cell disorder. -Patient notes he would not want to be very aggressive with work-up and would not want invasive testing at this time such as a bone marrow biopsy. -We discussed characterizing his condition further with  blood tests, 24 UPEP and whole-body skeletal survey. -Patient and family had several questions which were answered in details about the situation. -Patient has some mild anemia due to chronic kidney disease, no hypercalcemia.  Chronic kidney disease with multiple other possiby etiologies. -workup ordered as noted below  #2 chronic normocytic anemia patient's hemoglobin is 10.4 gradually decreasing over the last 2 years. 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 PLAN -Follow-up with nephrology and consider IV iron replacement to maintain ferritin more than 250 and iron saturation more than 30%. -ESA's if hemoglobin less than 9 despite optimization of iron and other vitamin replacement.  #3 complex renal cysts bilaterally rule out neoplasm -Patient has seen urology and is being evaluated by an MRI of the kidneys. -If there is any concern for RCC or neoplasm that can sometimes be a trigger for monoclonal paraproteinemia related to immune reaction to neoplasm. . Orders Placed This Encounter  Procedures   DG Bone Survey Met    Standing Status:   Future    Number of Occurrences:   1    Standing Expiration Date:   05/14/2021    Order Specific Question:   Reason for Exam (SYMPTOM  OR DIAGNOSIS REQUIRED)    Answer:   staging monoclonal paraproteinemia    Order Specific Question:   Preferred imaging location?    Answer:   [facility]   CBC with Differential/Platelet    Standing Status:   Future  Number of Occurrences:   1    Standing Expiration Date:   03/13/2022   CMP ([place] only)    Standing Status:   Future    Number of Occurrences:   1    Standing Expiration Date:   03/13/2022   Multiple Myeloma Panel (SPEP&IFE w/QIG)    Standing Status:   Future    Number of Occurrences:   1    Standing Expiration Date:   03/13/2022   Kappa/lambda light chains    Standing Status:   Future    Number of Occurrences:   1    Standing Expiration Date:   03/13/2022   Beta 2  microglobulin, serum    Standing Status:   Future    Number of Occurrences:   1    Standing Expiration Date:   03/13/2022   Lactate dehydrogenase    Standing Status:   Future    Number of Occurrences:   1    Standing Expiration Date:   03/13/2022   Sedimentation rate    Standing Status:   Future    Number of Occurrences:   1    Standing Expiration Date:   03/13/2022   24-Hr Ur UPEP/UIFE/Light Chains/TP    Standing Status:   Future    Number of Occurrences:   1    Standing Expiration Date:   03/13/2022      All of the patients questions were answered with apparent satisfaction. The patient knows to call the clinic with any problems, questions or concerns.  I spent 33 minutes counseling the patient face to face. The total time spent in the appointment was 47 minutes and more than 50% was on counseling and direct patient cares.    Sullivan Lone MD [facility] AAHIV[facility] Hematology/Oncology Physician [facility]  (Office):       [phone] (Work cell):  [phone] (Fax):           [phone]  03/13/2021 11:28 AM

## 2021-03-14 ENCOUNTER — Telehealth: Payer: Self-pay | Admitting: Hematology

## 2021-03-14 LAB — KAPPA/LAMBDA LIGHT CHAINS
Kappa free light chain: 58.7 mg/L — ABNORMAL HIGH (ref 3.3–19.4)
Kappa, lambda light chain ratio: 1.6 (ref 0.26–1.65)
Lambda free light chains: 36.7 mg/L — ABNORMAL HIGH (ref 5.7–26.3)

## 2021-03-14 NOTE — Telephone Encounter (Signed)
Scheduled follow-up appointment per 12/5 los. Patient's son is aware.

## 2021-03-15 LAB — BETA 2 MICROGLOBULIN, SERUM: Beta-2 Microglobulin: 7.3 mg/L — ABNORMAL HIGH (ref 0.6–2.4)

## 2021-03-16 DIAGNOSIS — Z87891 Personal history of nicotine dependence: Secondary | ICD-10-CM | POA: Diagnosis not present

## 2021-03-16 DIAGNOSIS — D631 Anemia in chronic kidney disease: Secondary | ICD-10-CM | POA: Diagnosis not present

## 2021-03-16 DIAGNOSIS — D472 Monoclonal gammopathy: Secondary | ICD-10-CM | POA: Diagnosis not present

## 2021-03-16 DIAGNOSIS — N281 Cyst of kidney, acquired: Secondary | ICD-10-CM | POA: Diagnosis not present

## 2021-03-16 DIAGNOSIS — N189 Chronic kidney disease, unspecified: Secondary | ICD-10-CM | POA: Diagnosis not present

## 2021-03-16 LAB — MULTIPLE MYELOMA PANEL, SERUM
Albumin SerPl Elph-Mcnc: 4.2 g/dL (ref 2.9–4.4)
Albumin/Glob SerPl: 1.7 (ref 0.7–1.7)
Alpha 1: 0.2 g/dL (ref 0.0–0.4)
Alpha2 Glob SerPl Elph-Mcnc: 0.7 g/dL (ref 0.4–1.0)
B-Globulin SerPl Elph-Mcnc: 0.9 g/dL (ref 0.7–1.3)
Gamma Glob SerPl Elph-Mcnc: 0.8 g/dL (ref 0.4–1.8)
Globulin, Total: 2.6 g/dL (ref 2.2–3.9)
IgA: 126 mg/dL (ref 61–437)
IgG (Immunoglobin G), Serum: 975 mg/dL (ref 603–1613)
IgM (Immunoglobulin M), Srm: 53 mg/dL (ref 15–143)
M Protein SerPl Elph-Mcnc: 0.4 g/dL — ABNORMAL HIGH
Total Protein ELP: 6.8 g/dL (ref 6.0–8.5)

## 2021-03-17 ENCOUNTER — Other Ambulatory Visit: Payer: Self-pay

## 2021-03-17 DIAGNOSIS — D472 Monoclonal gammopathy: Secondary | ICD-10-CM

## 2021-03-20 ENCOUNTER — Other Ambulatory Visit: Payer: Self-pay

## 2021-03-20 ENCOUNTER — Ambulatory Visit (HOSPITAL_COMMUNITY)
Admission: RE | Admit: 2021-03-20 | Discharge: 2021-03-20 | Disposition: A | Discharge status: Home / Self Care | Payer: Medicare Other | Source: Ambulatory Visit | Attending: Hematology | Admitting: Hematology

## 2021-03-20 DIAGNOSIS — D472 Monoclonal gammopathy: Secondary | ICD-10-CM

## 2021-03-21 LAB — UPEP/UIFE/LIGHT CHAINS/TP, 24-HR UR
% BETA, Urine: 0 %
ALPHA 1 URINE: 0 %
Albumin, U: 100 %
Alpha 2, Urine: 0 %
Free Kappa Lt Chains,Ur: 42.99 mg/L (ref 1.17–86.46)
Free Kappa/Lambda Ratio: 4.85 (ref 1.83–14.26)
Free Lambda Lt Chains,Ur: 8.86 mg/L (ref 0.27–15.21)
GAMMA GLOBULIN URINE: 0 %
Total Protein, Urine-Ur/day: 174 mg/24 hr — ABNORMAL HIGH (ref 30–150)
Total Protein, Urine: 7.9 mg/dL
Total Volume: 2200

## 2021-03-31 ENCOUNTER — Other Ambulatory Visit: Payer: Self-pay

## 2021-03-31 ENCOUNTER — Ambulatory Visit
Admission: RE | Admit: 2021-03-31 | Discharge: 2021-03-31 | Disposition: A | Discharge status: Home / Self Care | Payer: Medicare Other | Source: Ambulatory Visit | Attending: Urology | Admitting: Urology

## 2021-03-31 DIAGNOSIS — D4102 Neoplasm of uncertain behavior of left kidney: Secondary | ICD-10-CM

## 2021-03-31 DIAGNOSIS — K8020 Calculus of gallbladder without cholecystitis without obstruction: Secondary | ICD-10-CM | POA: Diagnosis not present

## 2021-03-31 DIAGNOSIS — N281 Cyst of kidney, acquired: Secondary | ICD-10-CM | POA: Diagnosis not present

## 2021-03-31 DIAGNOSIS — K7689 Other specified diseases of liver: Secondary | ICD-10-CM | POA: Diagnosis not present

## 2021-03-31 MED ORDER — GADOBENATE DIMEGLUMINE 529 MG/ML IV SOLN
15.0000 mL | Freq: Once | INTRAVENOUS | Status: AC | PRN
  Administered 2021-03-31: 15:00:00 15 mL via INTRAVENOUS

## 2021-04-06 ENCOUNTER — Inpatient Hospital Stay: Payer: Medicare Other | Admitting: Hematology

## 2021-04-06 ENCOUNTER — Other Ambulatory Visit: Payer: Self-pay

## 2021-04-06 VITALS — BP 131/60 | HR 61 | Temp 97.9°F | Resp 16 | Wt 177.4 lb

## 2021-04-06 DIAGNOSIS — D472 Monoclonal gammopathy: Secondary | ICD-10-CM

## 2021-04-06 DIAGNOSIS — N281 Cyst of kidney, acquired: Secondary | ICD-10-CM | POA: Diagnosis not present

## 2021-04-06 DIAGNOSIS — N189 Chronic kidney disease, unspecified: Secondary | ICD-10-CM | POA: Diagnosis not present

## 2021-04-06 DIAGNOSIS — D631 Anemia in chronic kidney disease: Secondary | ICD-10-CM | POA: Diagnosis not present

## 2021-04-06 DIAGNOSIS — Z87891 Personal history of nicotine dependence: Secondary | ICD-10-CM | POA: Diagnosis not present

## 2021-04-11 ENCOUNTER — Telehealth: Payer: Self-pay | Admitting: Hematology

## 2021-04-11 NOTE — Telephone Encounter (Signed)
Scheduled follow-up appointments per 12/29 los. Patient is aware. 

## 2021-04-12 NOTE — Progress Notes (Signed)
Marland Kitchen   HEMATOLOGY/ONCOLOGY CLINIC NOTE  Date of Service:.04/06/2021   Patient Care Team: Plotnikov, Evie Lacks, MD as PCP - Cyndia Diver, MD as PCP - Cardiology (Cardiology) Michael Boston, MD as Consulting Physician (General Surgery) Mansouraty, Telford Nab., MD as Consulting Physician (Gastroenterology) Katy Fitch, Darlina Guys, MD as Consulting Physician (Ophthalmology) Delice Bison, Darnelle Maffucci, [facility] as Pharmacist (Pharmacist)  CHIEF COMPLAINTS/PURPOSE OF CONSULTATION:  Follow-up to discuss work-up of monoclonal paraproteinemia  HISTORY OF PRESENTING ILLNESS:   Harold Pittman is a wonderful 81 y.o. male who has been referred to [place] by Dr Edrick Oh, MD for evaluation and management of evaluation of monoclonal paraproteinemia.  Patient is accompanied by his family members for this visit.  Patient has a history of coronary artery disease status post CABG in 1997, hypertension, diabetes tobacco abuse, BPH, gout, CHF with diastolic dysfunction, pulmonary hypertension who was recently evaluated by nephrology for his acute on chronic kidney disease.  He was noted to have a creatinine of 1.7 in April 2021 which had bumped up to 3.3 in July 2022 and was back down to 2.8 mg/dL on his last nephrology visit. His urinary sediment was noted to be inactive with no white blood cells or red blood cells and no significant proteinuria on UA. As part of his renal work-up he had a renal ultrasound in May 2021 which showed 2 complex cysts in the right kidney the largest being 4.8 cm in size multiseptated cyst in the midpole.  MRI evaluation of the kidney was recommended to rule out neoplasm. Patient notes he has recently seen urology and has an MRI ordered which is scheduled for 03/31/2021.  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  As part of his nephrology work-up he had SPEP on 01/19/2021 which showed M spike of  0.5 g/dL. IFE shows IgG monoclonal protein with kappa light chain specificity UPEP showed no M spike.  Patient notes no new focal bone pains. No fevers no chills no night sweats no unexpected weight loss.  INTERVAL HISTORY  Harold Pittman is here to follow-up on work-up for his monoclonal paraproteinemia.  He is accompanied by his son.  He notes no acute new symptoms since his last clinic visit. 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 Since his last clinic visit he also had an MRI of his abdomen which showed multiple bilateral renal cysts.  No enhancing renal lesions.  He follows up with Dr. Link Snuffer from urology. No other acute new focal symptoms.   MEDICAL HISTORY:  Past Medical History:  Diagnosis Date   Anemia    low iron   Anxiety state, unspecified    Blood in stool    Chronic airway obstruction, not elsewhere classified    Coronary artery disease    Coronary atherosclerosis of artery bypass graft    Dyspnea    ED (erectile dysfunction)    Esophageal reflux    Gout, unspecified    Osteoarthrosis, unspecified whether generalized or localized, unspecified site    Other and unspecified hyperlipidemia    Other specified disorder of rectum and anus    Personal history of tobacco use, presenting hazards to health    Sleep apnea    does not use cpap   Type II or unspecified type diabetes mellitus without mention of complication, not stated as uncontrolled  Unspecified essential hypertension     SURGICAL HISTORY: Past Surgical History:  Procedure Laterality Date   BIOPSY  09/26/2017   Procedure: BIOPSY;  Surgeon: Ladene Artist, MD;  Location: [facility] ENDOSCOPY;  Service: Endoscopy;;   BIOPSY  02/03/2018   Procedure: BIOPSY;  Surgeon: Irving Copas., MD;  Location: [place];  Service: Gastroenterology;;   COLONOSCOPY WITH PROPOFOL N/A 09/26/2017   Procedure:  COLONOSCOPY WITH PROPOFOL;  Surgeon: Ladene Artist, MD;  Location: [facility] ENDOSCOPY;  Service: Endoscopy;  Laterality: N/A;   CORONARY ARTERY BYPASS GRAFT  69   LIMA to LAD, sequential saphenous vein graft to the first and second diagonal, sequential saphenous vein graft to the intermediate OM and circumflex and SVG to RCA   ESOPHAGOGASTRODUODENOSCOPY (EGD) WITH PROPOFOL N/A 09/26/2017   Procedure: ESOPHAGOGASTRODUODENOSCOPY (EGD) WITH PROPOFOL;  Surgeon: Ladene Artist, MD;  Location: [facility] ENDOSCOPY;  Service: Endoscopy;  Laterality: N/A;   ESOPHAGOGASTRODUODENOSCOPY (EGD) WITH PROPOFOL N/A 02/03/2018   Procedure: ESOPHAGOGASTRODUODENOSCOPY (EGD);  Surgeon: Irving Copas., MD;  Location: [place];  Service: Gastroenterology;  Laterality: N/A;   KNEE SURGERY     BILATERAL   POLYPECTOMY  09/26/2017   Procedure: POLYPECTOMY;  Surgeon: Ladene Artist, MD;  Location: [place];  Service: Endoscopy;;   [place] INJECTION  02/03/2018   Procedure: SUBMUCOSAL INJECTION;  Surgeon: Irving Copas., MD;  Location: [facility] ENDOSCOPY;  Service: Gastroenterology;;    SOCIAL HISTORY: Social History   Socioeconomic History   Marital status: Widowed    Spouse name: Not on file   Number of children: Not on file   Years of education: Not on file   Highest education level: Not on file  Occupational History   Occupation: RETIRED  Tobacco Use   Smoking status: Former   Smokeless tobacco: Never   Tobacco comments:    Stopped 1997  Vaping Use   Vaping Use: Never used  Substance and Sexual Activity   Alcohol use: No    Comment: Stopped 1997   Drug use: No   Sexual activity: Not Currently  Other Topics Concern   Not on file  Social History Narrative   Patient does not get regular exercise   Daily Caffeine use - 6      Wife decease 07/04/05;       Social Determinants of Health   Financial Resource Strain: Not on file  Food Insecurity: Not on file   Transportation Needs: Not on file  Physical Activity: Not on file  Stress: Not on file  Social Connections: Not on file  Intimate Partner Violence: Not on file    FAMILY HISTORY: Family History  Problem Relation Age of Onset   Depression Sister    Heart disease Sister    Heart disease Mother 6       CAD   Mental illness Father        Alzheimer's   Hypertension Other    Coronary artery disease Other        Male 1st degree relative <50   Colon cancer Neg Hx    Stomach cancer Neg Hx    Esophageal cancer Neg Hx    Liver disease Neg Hx    Pancreatic cancer Neg Hx    Rectal cancer Neg Hx     ALLERGIES:  is allergic to benazepril.  MEDICATIONS:  Current Outpatient Medications  Medication Sig Dispense Refill   amLODipine (NORVASC) 10 MG tablet Take 1 tablet (10 mg  total) by mouth daily. 90 tablet 3   aspirin 81 MG EC tablet Take 1 tablet (81 mg total) by mouth daily. 100 tablet 3   Blood Glucose Monitoring Suppl (ONETOUCH VERIO FLEX SYSTEM) w/Device KIT USE TO MONITOR BLOOD SUGAR 1 kit 0   carvedilol (COREG) 25 MG tablet TAKE 1 TABLET BY MOUTH  TWICE DAILY WITH A MEAL 180 tablet 3   Cholecalciferol (VITAMIN D3) 50 MCG (2000 UT) capsule Take 1 capsule (2,000 Units total) by mouth daily. 100 capsule 3   colchicine 0.6 MG tablet Take 1 tablet (0.6 mg total) by mouth 2 (two) times daily as needed. 30 tablet 0   Cyanocobalamin (VITAMIN B-12) 1000 MCG SUBL Place 1 tablet (1,000 mcg total) under the tongue daily. 100 tablet 3   cyclobenzaprine (FLEXERIL) 10 MG tablet Take 1 tablet (10 mg total) by mouth at bedtime. 90 tablet 3   docusate sodium (COLACE) 100 MG capsule Take 1 capsule (100 mg total) by mouth 2 (two) times daily. 10 capsule 0   famotidine (PEPCID) 40 MG tablet Take 1 tablet (40 mg total) by mouth daily. 90 tablet 3   ferrous sulfate 325 (65 FE) MG tablet Take 1 tablet (325 mg total) by mouth daily. 90 tablet 3   gabapentin (NEURONTIN) 300 MG capsule Take 300 mg by mouth 3  (three) times daily.     glucose blood (ONETOUCH VERIO) test strip CHECK BLOOD SUGAR 1 TO 2  TIMES DAILY AS DIRECTED 200 strip 3   hydrALAZINE (APRESOLINE) 50 MG tablet TAKE 1 TABLET BY MOUTH 3  TIMES DAILY 270 tablet 3   HYDROcodone-acetaminophen (NORCO) 5-325 MG tablet Take 1 tablet by mouth every 6 (six) hours as needed for moderate pain. (Patient not taking: Reported on 01/02/2021) 20 tablet 0   irbesartan (AVAPRO) 150 MG tablet Take 0.5 tablets (75 mg total) by mouth daily. 45 tablet 3   Lancets (ONETOUCH DELICA PLUS JHERDE08X) MISC USE AS DIRECTED 200 each 3   metFORMIN (GLUCOPHAGE-XR) 750 MG 24 hr tablet TAKE 1 TABLET BY MOUTH  DAILY WITH BREAKFAST (Patient not taking: Reported on 03/13/2021) 90 tablet 3   omeprazole (PRILOSEC) 40 MG capsule TAKE 1 CAPSULE BY MOUTH  DAILY 90 capsule 3   ondansetron (ZOFRAN) 4 MG tablet Take 1 tablet (4 mg total) by mouth every 8 (eight) hours as needed for nausea or vomiting. (Patient not taking: Reported on 03/13/2021) 21 tablet 1   potassium chloride (KLOR-CON) 10 MEQ tablet TAKE 1 TABLET(10 MEQ) BY MOUTH DAILY 7 tablet 0   pravastatin (PRAVACHOL) 80 MG tablet TAKE 1 TABLET BY MOUTH AT  BEDTIME 90 tablet 3   tadalafil (ADCIRCA/CIALIS) 20 MG tablet Take 20 mg by mouth as needed for erectile dysfunction. (Patient not taking: Reported on 03/17/2021)     torsemide (DEMADEX) 100 MG tablet Take 0.5 tablets (50 mg total) by mouth daily. (Patient taking differently: Take 100 mg by mouth daily.) 90 tablet 3   No current facility-administered medications for this visit.    REVIEW OF SYSTEMS:   .10 Point review of Systems was done is negative except as noted above.   PHYSICAL EXAMINATION:  Vitals:   04/06/21 1412  BP: 131/60  Pulse: 61  Resp: 16  Temp: 97.9 F (36.6 C)  SpO2: 99%   Filed Weights   04/06/21 1412  Weight: 177 lb 6.4 oz (80.5 kg)   .Body mass index is 23.41 kg/m. Marland Kitchen GENERAL:alert, in no acute distress and comfortable NECK: supple, no  JVD LYMPH:  no palpable lymphadenopathy in the cervical, axillary or inguinal regions LUNGS: clear to auscultation b/l with normal respiratory effort HEART: regular rate & rhythm ABDOMEN:  normoactive bowel sounds , non tender, not distended. Extremity: no pedal edema PSYCH: alert & oriented x 3 with fluent speech  LABORATORY DATA:  I have reviewed the data as listed  . CBC Latest Ref Rng & Units 03/13/2021 11/29/2020 11/18/2019  WBC 4.0 - 10.5 K/uL 4.9 6.4 5.5  Hemoglobin 13.0 - 17.0 g/dL 10.4(L) 10.7(L) 11.3(L)  Hematocrit 39.0 - 52.0 % 30.8(L) 32.6(L) 34.1(L)  Platelets 150 - 400 K/uL 202 239 210    . CMP Latest Ref Rng & Units 03/13/2021 01/02/2021 11/29/2020  Glucose 70 - 99 mg/dL 102(H) 133(H) 182(H)  BUN 8 - 23 mg/dL 47(H) 49(H) 44(H)  Creatinine 0.61 - 1.24 mg/dL 3.14(HH) 3.02(H) 3.04(H)  Sodium 135 - 145 mmol/L 146(H) 143 140  Potassium 3.5 - 5.1 mmol/L 3.3(L) 4.3 3.2(L)  Chloride 98 - 111 mmol/L 105 99 102  CO2 22 - 32 mmol/L 27 26 29   Calcium 8.9 - 10.3 mg/dL 9.3 10.3(H) 9.5  Total Protein 6.5 - 8.1 g/dL 7.3 - 6.9  Total Bilirubin 0.3 - 1.2 mg/dL 0.7 - 0.6  Alkaline Phos 38 - 126 U/L 100 - 75  AST 15 - 41 U/L 26 - 21  ALT 0 - 44 U/L 19 - 16   Component     Latest Ref Rng & Units 03/13/2021 03/16/2021  IgG (Immunoglobin G), Serum     603 - 1,613 mg/dL 975   IgA     61 - 437 mg/dL 126   IgM (Immunoglobulin M), Srm     15 - 143 mg/dL 53   Total Protein ELP     6.0 - 8.5 g/dL 6.8   Albumin SerPl Elph-Mcnc     2.9 - 4.4 g/dL 4.2   Alpha 1     0.0 - 0.4 g/dL 0.2   Alpha2 Glob SerPl Elph-Mcnc     0.4 - 1.0 g/dL 0.7   B-Globulin SerPl Elph-Mcnc     0.7 - 1.3 g/dL 0.9   Gamma Glob SerPl Elph-Mcnc     0.4 - 1.8 g/dL 0.8   M Protein SerPl Elph-Mcnc     Not Observed g/dL 0.4 (H)   Globulin, Total     2.2 - 3.9 g/dL 2.6   Albumin/Glob SerPl     0.7 - 1.7 1.7   IFE 1      Comment (A)   Please Note (HCV):      Comment   Total Protein, Urine-UPE24     Not Estab.  mg/dL  7.9  Total Protein, Urine-Ur/day     30 - 150 mg/24 hr  174 (H)  ALBUMIN, U     %  100.0  ALPHA 1 URINE     %  0.0  Alpha 2, Urine     %  0.0  % BETA, Urine     %  0.0  GAMMA GLOBULIN URINE     %  0.0  Free Kappa Lt Chains,Ur     1.17 - 86.46 mg/L  42.99  Free Lambda Lt Chains,Ur     0.27 - 15.21 mg/L  8.86  Free Kappa/Lambda Ratio     1.83 - 14.26  4.85  Immunofixation Result, Urine       Comment  Total Volume       2,200  M-SPIKE %, Urine  Not Observed %  Not Observed  NOTE:       Comment  Kappa free light chain     3.3 - 19.4 mg/L 58.7 (H)   Lambda free light chains     5.7 - 26.3 mg/L 36.7 (H)   Kappa, lambda light chain ratio     0.26 - 1.65 1.60   Beta-2 Microglobulin     0.6 - 2.4 mg/L 7.3 (H)   LDH     98 - 192 U/L 215 (H)   Sed Rate     0 - 16 mm/hr 40 (H)      RADIOGRAPHIC STUDIES: I have personally reviewed the radiological images as listed and agreed with the findings in the report. Harold ABDOMEN WWO CONTRAST  Result Date: 04/01/2021 CLINICAL DATA:  Left renal lesion on ultrasound EXAM: MRI ABDOMEN WITHOUT AND WITH CONTRAST TECHNIQUE: Multiplanar multisequence Harold imaging of the abdomen was performed both before and after the administration of intravenous contrast. CONTRAST:  64m MULTIHANCE GADOBENATE DIMEGLUMINE 529 MG/ML IV SOLN COMPARISON:  Renal ultrasound dated 02/08/2021. CT abdomen/pelvis dated 10/30/2005. FINDINGS: Lower chest: Lung bases are clear. Hepatobiliary: Scattered hepatic cysts measuring up to 13 mm in the anterior right hepatic dome (series 3/image 5). No suspicious/enhancing hepatic lesions. Layering tiny gallstones (series 3/image 16), without associated inflammatory changes. No intrahepatic or extrahepatic ductal dilatation. Pancreas:  Within normal limits. Spleen:  Within normal limits. Adrenals/Urinary Tract:  Adrenal glands are within normal limits. Left kidney is notable for multiple simple cysts measuring up to 12 mm in the  lateral left lower kidney (series 3/image 20), benign (Bosniak I). A single 14 mm cyst in the posterior left lower kidney demonstrates mild layering hemorrhage (series 10/image 78), benign (Bosniak II). Right kidney is notable for multiple simple cysts measuring up to 3.3 cm in the right lower kidney (series 3/image 29). Scattered hemorrhagic cysts measuring up to 2.2 cm in the lateral right lower kidney (series 3/image 24), without enhancement on postcontrast subtraction imaging, benign (Bosniak II). Mildly septated cysts, including a 3.7 cm cyst in the posterior right upper kidney (series 3/image 18), benign (Bosniak II). Additionally, there is a 3.8 x 5.3 cm cyst in the anterior interpolar right kidney with multiple thin septations (series 3/image 23), without solid component/mural nodule or enhancement. While this would technically reflect a Bosniak IIF lesion, this was present in 2007, measuring 3.5 x 4.5 cm. Given relative stability over 15 years, this can also be considered benign (Bosniak II). No enhancing renal lesions.  No hydronephrosis. Stomach/Bowel: Stomach is within normal limits. Visualized bowel is grossly unremarkable. Vascular/Lymphatic:  No evidence of abdominal aortic aneurysm. No suspicious abdominal lymphadenopathy. Other:  No abdominal ascites. Musculoskeletal: No focal osseous lesions. IMPRESSION: Multiple bilateral renal cysts, some of which are hemorrhagic or mildly septated, benign (Bosniak I-II). No enhancing renal lesions. Cholelithiasis, without associated inflammatory changes. Electronically Signed   By: SJulian HyM.D.   On: 04/01/2021 13:03   DG Bone Survey Met  Result Date: 03/21/2021 CLINICAL DATA:  Monoclonal paraproteinemia.  Staging. EXAM: METASTATIC BONE SURVEY COMPARISON:  No prior bone survey available. Chest radiograph 11/29/2020 as well as additional prior radiographs reviewed. FINDINGS: No visualized lytic or destructive bone lesion. Multifocal osteoarthritic  change of the shoulders, knees, and hips. Diffuse degenerative change throughout the cervical, thoracic, and lumbar spine. No evidence of vertebral body compression fracture. Patient is post CABG. Broken sternal wires are unchanged the heart is normal in size. Aortic atherosclerosis is well as carotid  calcifications are seen. IMPRESSION: 1. No visualized focal bone lesion. 2. Multifocal osteoarthritis as well as diffuse degenerative change throughout the cervical, thoracic, and lumbar spine. Electronically Signed   By: Keith Rake M.D.   On: 03/21/2021 12:26    ASSESSMENT & PLAN:   81 year old male with  #1  IgG kappa monoclonal paraproteinemia Patient has an M spike of 0.5 g/dL on 01/19/2021. This was found in the context of work-up for chronic kidney disease. Patient does not have significant proteinuria as per urine analysis and so overt renal amyloidosis is less likely based on the time course of his chronic kidney disease and lack of proteinuria. Also patient has multiple other medical comorbidities that explain his chronic kidney disease including hypertension, diabetes, cardiorenal syndrome, gout with possibility of uric acid nephropathy, renal cysts etc.. PLAN -Patient's lab results were discussed in detail.  His M spike is only at 0.4 g/dL and has not increased over the last couple of months. -Mild stable anemia related to chronic kidney disease. -Serum kappa lambda free light chain ratio within normal limits -24-hour UPEP with no evidence of monoclonal protein Beta-2 microglobulin is elevated at 7.3 but this is due to decreased clearance from his chronic kidney disease We discussed his whole-body bone scan which shows normal evidence of focal bone lesions concerning for myeloma. 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 -At this time the patient would like to hold off on invasive  work-up with a bone marrow biopsy and prefers to monitor this with labs.  #2 chronic normocytic anemia patient's hemoglobin is 10.4 gradually decreasing over the last 2 years. 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 PLAN -Follow-up with nephrology and consider IV iron replacement to maintain ferritin more than 250 and iron saturation more than 30%. -ESA's if hemoglobin less than 9 despite optimization of iron and other vitamin replacement.  #3 complex renal cysts bilaterally rule out neoplasm -MRI of the abdomen was done by urology.  Patient has urology follow-up  Follow-up  Phone visit with Dr Irene Limbo in 6 months Labs 1 week prior to phone visit    All of the patients questions were answered with apparent satisfaction. The patient knows to call the clinic with any problems, questions or concerns.    Sullivan Lone MD [place] AAHIVMS [facility] Hematology/Oncology Physician [facility]

## 2021-04-25 ENCOUNTER — Telehealth: Payer: Self-pay

## 2021-04-25 MED ORDER — COLCHICINE 0.6 MG PO TABS: 0.6000 mg | ORAL_TABLET | Freq: Two times a day (BID) | ORAL | 0 refills | Status: DC | PRN

## 2021-04-25 NOTE — Telephone Encounter (Signed)
Per office policy sent 30 day to local pharmacy until appt.../lmb  

## 2021-04-25 NOTE — Telephone Encounter (Signed)
Pt calling requesting a refill on his Gout medication. colchicine 0.6 MG tablet Pt is currently having a flare up. Flare up started 04/21/21  Pharmacy: [facility] [place], [place] 12/05/20  CB: [phone]

## 2021-05-03 ENCOUNTER — Ambulatory Visit (INDEPENDENT_AMBULATORY_CARE_PROVIDER_SITE_OTHER): Payer: Medicare Other | Admitting: Internal Medicine

## 2021-05-03 ENCOUNTER — Other Ambulatory Visit: Payer: Self-pay

## 2021-05-03 ENCOUNTER — Encounter: Payer: Self-pay | Admitting: Internal Medicine

## 2021-05-03 VITALS — BP 104/58 | HR 54 | Temp 98.1°F | Ht 73.0 in | Wt 170.6 lb

## 2021-05-03 DIAGNOSIS — E1151 Type 2 diabetes mellitus with diabetic peripheral angiopathy without gangrene: Secondary | ICD-10-CM

## 2021-05-03 DIAGNOSIS — Z23 Encounter for immunization: Secondary | ICD-10-CM | POA: Diagnosis not present

## 2021-05-03 DIAGNOSIS — N1832 Chronic kidney disease, stage 3b: Secondary | ICD-10-CM | POA: Diagnosis not present

## 2021-05-03 DIAGNOSIS — R269 Unspecified abnormalities of gait and mobility: Secondary | ICD-10-CM | POA: Diagnosis not present

## 2021-05-03 DIAGNOSIS — I10 Essential (primary) hypertension: Secondary | ICD-10-CM

## 2021-05-03 DIAGNOSIS — I5032 Chronic diastolic (congestive) heart failure: Secondary | ICD-10-CM

## 2021-05-03 LAB — HEMOGLOBIN A1C: Hgb A1c MFr Bld: 6 % (ref 4.6–6.5)

## 2021-05-03 LAB — COMPREHENSIVE METABOLIC PANEL
ALT: 20 U/L (ref 0–53)
AST: 26 U/L (ref 0–37)
Albumin: 4.3 g/dL (ref 3.5–5.2)
Alkaline Phosphatase: 98 U/L (ref 39–117)
BUN: 65 mg/dL — ABNORMAL HIGH (ref 6–23)
CO2: 30 mEq/L (ref 19–32)
Calcium: 9.6 mg/dL (ref 8.4–10.5)
Chloride: 102 mEq/L (ref 96–112)
Creatinine, Ser: 3.43 mg/dL — ABNORMAL HIGH (ref 0.40–1.50)
GFR: 16.25 mL/min — ABNORMAL LOW (ref >60.00)
Glucose, Bld: 112 mg/dL — ABNORMAL HIGH (ref 70–99)
Potassium: 3.7 mEq/L (ref 3.5–5.1)
Sodium: 143 mEq/L (ref 135–145)
Total Bilirubin: 0.6 mg/dL (ref 0.2–1.2)
Total Protein: 7.2 g/dL (ref 6.0–8.3)

## 2021-05-03 LAB — TSH: TSH: 0.48 u[IU]/mL (ref 0.35–5.50)

## 2021-05-03 NOTE — Assessment & Plan Note (Signed)
Worse Start PT

## 2021-05-03 NOTE — Assessment & Plan Note (Signed)
Check CMET. 

## 2021-05-03 NOTE — Assessment & Plan Note (Signed)
Cont w/Metformin, Pravastatin, ASA

## 2021-05-03 NOTE — Assessment & Plan Note (Signed)
Cont on Coreg, Hydralazine, Lasix, Irbesartan

## 2021-05-03 NOTE — Progress Notes (Signed)
Subjective:  Patient ID: [name], male    DOB: 1941-02-13  Age: 81 y.o. MRN: [id number]  CC: Gait Problem (Pt states not able to keep his balance.. want rx for walker)   HPI Marsh Dolly Burnstein presents for gait disorder - worse He is here w/his son Reece Agar F/u HTN/DM   Outpatient Medications Prior to Visit  Medication Sig Dispense Refill   amLODipine (NORVASC) 10 MG tablet Take 1 tablet (10 mg total) by mouth daily. 90 tablet 3   aspirin 81 MG EC tablet Take 1 tablet (81 mg total) by mouth daily. 100 tablet 3   Blood Glucose Monitoring Suppl (ONETOUCH VERIO FLEX SYSTEM) w/Device KIT USE TO MONITOR BLOOD SUGAR 1 kit 0   carvedilol (COREG) 25 MG tablet TAKE 1 TABLET BY MOUTH  TWICE DAILY WITH A MEAL 180 tablet 3   Cholecalciferol (VITAMIN D3) 50 MCG (2000 UT) capsule Take 1 capsule (2,000 Units total) by mouth daily. 100 capsule 3   colchicine 0.6 MG tablet Take 1 tablet (0.6 mg total) by mouth 2 (two) times daily as needed. 30 tablet 0   Cyanocobalamin (VITAMIN B-12) 1000 MCG SUBL Place 1 tablet (1,000 mcg total) under the tongue daily. 100 tablet 3   cyclobenzaprine (FLEXERIL) 10 MG tablet Take 1 tablet (10 mg total) by mouth at bedtime. 90 tablet 3   docusate sodium (COLACE) 100 MG capsule Take 1 capsule (100 mg total) by mouth 2 (two) times daily. 10 capsule 0   famotidine (PEPCID) 40 MG tablet Take 1 tablet (40 mg total) by mouth daily. 90 tablet 3   gabapentin (NEURONTIN) 300 MG capsule Take 300 mg by mouth 3 (three) times daily.     glucose blood (ONETOUCH VERIO) test strip CHECK BLOOD SUGAR 1 TO 2  TIMES DAILY AS DIRECTED 200 strip 3   hydrALAZINE (APRESOLINE) 50 MG tablet TAKE 1 TABLET BY MOUTH 3  TIMES DAILY 270 tablet 3   HYDROcodone-acetaminophen (NORCO) 5-325 MG tablet Take 1 tablet by mouth every 6 (six) hours as needed for moderate pain. 20 tablet 0   irbesartan (AVAPRO) 150 MG tablet Take 0.5 tablets (75 mg total) by mouth daily. 45 tablet 3   Lancets (ONETOUCH DELICA  PLUS [id number]) MISC USE AS DIRECTED 200 each 3   metFORMIN (GLUCOPHAGE-XR) 750 MG 24 hr tablet TAKE 1 TABLET BY MOUTH  DAILY WITH BREAKFAST 90 tablet 3   omeprazole (PRILOSEC) 40 MG capsule TAKE 1 CAPSULE BY MOUTH  DAILY 90 capsule 3   ondansetron (ZOFRAN) 4 MG tablet Take 1 tablet (4 mg total) by mouth every 8 (eight) hours as needed for nausea or vomiting. 21 tablet 1   potassium chloride (KLOR-CON) 10 MEQ tablet TAKE 1 TABLET(10 MEQ) BY MOUTH DAILY 7 tablet 0   pravastatin (PRAVACHOL) 80 MG tablet TAKE 1 TABLET BY MOUTH AT  BEDTIME 90 tablet 3   tadalafil (ADCIRCA/CIALIS) 20 MG tablet Take 20 mg by mouth as needed for erectile dysfunction.     torsemide (DEMADEX) 100 MG tablet Take 0.5 tablets (50 mg total) by mouth daily. (Patient taking differently: Take 100 mg by mouth daily.) 90 tablet 3   ferrous sulfate 325 (65 FE) MG tablet Take 1 tablet (325 mg total) by mouth daily. 90 tablet 3   No facility-administered medications prior to visit.    ROS: Review of Systems  Constitutional:  Negative for appetite change, fatigue and unexpected weight change.  HENT:  Negative for congestion, nosebleeds, sneezing, sore throat and  trouble swallowing.   Eyes:  Negative for itching and visual disturbance.  Respiratory:  Negative for cough.   Cardiovascular:  Negative for chest pain, palpitations and leg swelling.  Gastrointestinal:  Negative for abdominal distention, blood in stool, diarrhea and nausea.  Genitourinary:  Negative for frequency and hematuria.  Musculoskeletal:  Positive for gait problem. Negative for back pain, joint swelling and neck pain.  Skin:  Negative for rash.  Neurological:  Negative for dizziness, tremors, speech difficulty and weakness.  Psychiatric/Behavioral:  Negative for agitation, dysphoric mood and sleep disturbance. The patient is not nervous/anxious.    Objective:  BP (!) 104/58 (BP Location: Left Arm)    Pulse (!) 54    Temp 98.1 F (36.7 C) (Oral)    Ht 6' 1"   (1.854 m)    Wt 170 lb 9.6 oz (77.4 kg)    SpO2 96%    BMI 22.51 kg/m   BP Readings from Last 3 Encounters:  05/03/21 (!) 104/58  04/06/21 131/60  03/13/21 111/63    Wt Readings from Last 3 Encounters:  05/03/21 170 lb 9.6 oz (77.4 kg)  04/06/21 177 lb 6.4 oz (80.5 kg)  03/13/21 169 lb 4.8 oz (76.8 kg)    Physical Exam Constitutional:      General: He is not in acute distress.    Appearance: He is well-developed.     Comments: NAD  Eyes:     Conjunctiva/sclera: Conjunctivae normal.     Pupils: Pupils are equal, round, and reactive to light.  Neck:     Thyroid: No thyromegaly.     Vascular: No JVD.  Cardiovascular:     Rate and Rhythm: Normal rate and regular rhythm.     Heart sounds: Normal heart sounds. No murmur heard.   No friction rub. No gallop.  Pulmonary:     Effort: Pulmonary effort is normal. No respiratory distress.     Breath sounds: Normal breath sounds. No wheezing or rales.  Chest:     Chest wall: No tenderness.  Abdominal:     General: Bowel sounds are normal. There is no distension.     Palpations: Abdomen is soft. There is no mass.     Tenderness: There is no abdominal tenderness. There is no guarding or rebound.  Musculoskeletal:        General: No tenderness. Normal range of motion.     Cervical back: Normal range of motion.  Lymphadenopathy:     Cervical: No cervical adenopathy.  Skin:    General: Skin is warm and dry.     Findings: No rash.  Neurological:     Mental Status: He is alert and oriented to person, place, and time.     Cranial Nerves: No cranial nerve deficit.     Motor: No abnormal muscle tone.     Coordination: Coordination normal.     Gait: Gait normal.     Deep Tendon Reflexes: Reflexes are normal and symmetric.  Psychiatric:        Behavior: Behavior normal.        Thought Content: Thought content normal.        Judgment: Judgment normal.    Lab Results  Component Value Date   WBC 4.9 03/13/2021   HGB 10.4 (L)  03/13/2021   HCT 30.8 (L) 03/13/2021   PLT 202 03/13/2021   GLUCOSE 102 (H) 03/13/2021   CHOL 120 07/21/2019   TRIG 68.0 07/21/2019   HDL 31.70 (L) 07/21/2019   LDLCALC 75 07/21/2019  ALT 19 03/13/2021   AST 26 03/13/2021   NA 146 (H) 03/13/2021   K 3.3 (L) 03/13/2021   CL 105 03/13/2021   CREATININE 3.14 (HH) 03/13/2021   BUN 47 (H) 03/13/2021   CO2 27 03/13/2021   TSH 0.89 11/18/2019   PSA 8.92 (H) 04/22/2019   INR 1.35 07/23/2015   HGBA1C 6.7 (H) 10/12/2020   MICROALBUR 38.7 (H) 11/24/2013    MR ABDOMEN WWO CONTRAST  Result Date: 04/01/2021 CLINICAL DATA:  Left renal lesion on ultrasound EXAM: MRI ABDOMEN WITHOUT AND WITH CONTRAST TECHNIQUE: Multiplanar multisequence MR imaging of the abdomen was performed both before and after the administration of intravenous contrast. CONTRAST:  35m MULTIHANCE GADOBENATE DIMEGLUMINE 529 MG/ML IV SOLN COMPARISON:  Renal ultrasound dated 02/08/2021. CT abdomen/pelvis dated 10/30/2005. FINDINGS: Lower chest: Lung bases are clear. Hepatobiliary: Scattered hepatic cysts measuring up to 13 mm in the anterior right hepatic dome (series 3/image 5). No suspicious/enhancing hepatic lesions. Layering tiny gallstones (series 3/image 16), without associated inflammatory changes. No intrahepatic or extrahepatic ductal dilatation. Pancreas:  Within normal limits. Spleen:  Within normal limits. Adrenals/Urinary Tract:  Adrenal glands are within normal limits. Left kidney is notable for multiple simple cysts measuring up to 12 mm in the lateral left lower kidney (series 3/image 20), benign (Bosniak I). A single 14 mm cyst in the posterior left lower kidney demonstrates mild layering hemorrhage (series 10/image 78), benign (Bosniak II). Right kidney is notable for multiple simple cysts measuring up to 3.3 cm in the right lower kidney (series 3/image 29). Scattered hemorrhagic cysts measuring up to 2.2 cm in the lateral right lower kidney (series 3/image 24), without  enhancement on postcontrast subtraction imaging, benign (Bosniak II). Mildly septated cysts, including a 3.7 cm cyst in the posterior right upper kidney (series 3/image 18), benign (Bosniak II). Additionally, there is a 3.8 x 5.3 cm cyst in the anterior interpolar right kidney with multiple thin septations (series 3/image 23), without solid component/mural nodule or enhancement. While this would technically reflect a Bosniak IIF lesion, this was present in 2007, measuring 3.5 x 4.5 cm. Given relative stability over 15 years, this can also be considered benign (Bosniak II). No enhancing renal lesions.  No hydronephrosis. Stomach/Bowel: Stomach is within normal limits. Visualized bowel is grossly unremarkable. Vascular/Lymphatic:  No evidence of abdominal aortic aneurysm. No suspicious abdominal lymphadenopathy. Other:  No abdominal ascites. Musculoskeletal: No focal osseous lesions. IMPRESSION: Multiple bilateral renal cysts, some of which are hemorrhagic or mildly septated, benign (Bosniak I-II). No enhancing renal lesions. Cholelithiasis, without associated inflammatory changes. Electronically Signed   By: SJulian HyM.D.   On: 04/01/2021 13:03    Assessment & Plan:   Problem List Items Addressed This Visit     Chronic diastolic CHF (congestive heart failure) (HCC)    Cont on Coreg, Hydralazine, Lasix, Irbesartan       CKD (chronic kidney disease), stage III (HCC)    Check CMET      Relevant Orders   Comprehensive metabolic panel   Hemoglobin A1c   TSH   DM (diabetes mellitus), type 2 with peripheral vascular complications (HCC)    Cont w/Metformin, Pravastatin, ASA      Relevant Orders   Comprehensive metabolic panel   Hemoglobin A1c   TSH   Essential hypertension    Cont on Coreg, Hydralazine, Lasix, Irbesartan       Gait disorder - Primary    Worse Start PT      Relevant Orders  Ambulatory referral to Physical Therapy   Other Visit Diagnoses     Needs flu shot        Relevant Orders   Flu Vaccine QUAD High Dose(Fluad) (Completed)         No orders of the defined types were placed in this encounter.     Follow-up: Return in about 3 months (around 08/01/2021) for a follow-up visit.  Walker Kehr, MD

## 2021-05-15 ENCOUNTER — Other Ambulatory Visit: Payer: Self-pay

## 2021-05-15 ENCOUNTER — Encounter (HOSPITAL_COMMUNITY): Payer: Self-pay

## 2021-05-15 ENCOUNTER — Ambulatory Visit (INDEPENDENT_AMBULATORY_CARE_PROVIDER_SITE_OTHER): Payer: Medicare Other | Admitting: Internal Medicine

## 2021-05-15 ENCOUNTER — Encounter: Payer: Self-pay | Admitting: Neurology

## 2021-05-15 ENCOUNTER — Encounter: Payer: Self-pay | Admitting: Internal Medicine

## 2021-05-15 ENCOUNTER — Emergency Department (HOSPITAL_COMMUNITY): Payer: Medicare Other

## 2021-05-15 ENCOUNTER — Inpatient Hospital Stay (HOSPITAL_COMMUNITY)
Admission: EM | Admit: 2021-05-15 | Discharge: 2021-05-18 | DRG: 558 | Disposition: A | Discharge status: Home Health | Payer: Medicare Other | Attending: Internal Medicine | Admitting: Internal Medicine

## 2021-05-15 VITALS — BP 136/70 | HR 73 | Temp 98.8°F | Ht 73.0 in | Wt 175.0 lb

## 2021-05-15 DIAGNOSIS — Z6822 Body mass index (BMI) 22.0-22.9, adult: Secondary | ICD-10-CM

## 2021-05-15 DIAGNOSIS — T502X5A Adverse effect of carbonic-anhydrase inhibitors, benzothiadiazides and other diuretics, initial encounter: Secondary | ICD-10-CM | POA: Diagnosis present

## 2021-05-15 DIAGNOSIS — I2510 Atherosclerotic heart disease of native coronary artery without angina pectoris: Secondary | ICD-10-CM | POA: Diagnosis present

## 2021-05-15 DIAGNOSIS — D72829 Elevated white blood cell count, unspecified: Secondary | ICD-10-CM | POA: Diagnosis present

## 2021-05-15 DIAGNOSIS — I5032 Chronic diastolic (congestive) heart failure: Secondary | ICD-10-CM | POA: Diagnosis present

## 2021-05-15 DIAGNOSIS — M71121 Other infective bursitis, right elbow: Secondary | ICD-10-CM | POA: Diagnosis not present

## 2021-05-15 DIAGNOSIS — I10 Essential (primary) hypertension: Secondary | ICD-10-CM | POA: Diagnosis present

## 2021-05-15 DIAGNOSIS — N184 Chronic kidney disease, stage 4 (severe): Secondary | ICD-10-CM | POA: Insufficient documentation

## 2021-05-15 DIAGNOSIS — Z820 Family history of epilepsy and other diseases of the nervous system: Secondary | ICD-10-CM | POA: Diagnosis not present

## 2021-05-15 DIAGNOSIS — Z818 Family history of other mental and behavioral disorders: Secondary | ICD-10-CM

## 2021-05-15 DIAGNOSIS — N179 Acute kidney failure, unspecified: Secondary | ICD-10-CM | POA: Diagnosis present

## 2021-05-15 DIAGNOSIS — N529 Male erectile dysfunction, unspecified: Secondary | ICD-10-CM | POA: Diagnosis present

## 2021-05-15 DIAGNOSIS — R251 Tremor, unspecified: Secondary | ICD-10-CM | POA: Diagnosis not present

## 2021-05-15 DIAGNOSIS — M7021 Olecranon bursitis, right elbow: Secondary | ICD-10-CM | POA: Diagnosis not present

## 2021-05-15 DIAGNOSIS — I130 Hypertensive heart and chronic kidney disease with heart failure and stage 1 through stage 4 chronic kidney disease, or unspecified chronic kidney disease: Secondary | ICD-10-CM | POA: Diagnosis not present

## 2021-05-15 DIAGNOSIS — R509 Fever, unspecified: Secondary | ICD-10-CM | POA: Diagnosis present

## 2021-05-15 DIAGNOSIS — Z20822 Contact with and (suspected) exposure to covid-19: Secondary | ICD-10-CM | POA: Diagnosis not present

## 2021-05-15 DIAGNOSIS — M1990 Unspecified osteoarthritis, unspecified site: Secondary | ICD-10-CM | POA: Diagnosis present

## 2021-05-15 DIAGNOSIS — Z7982 Long term (current) use of aspirin: Secondary | ICD-10-CM

## 2021-05-15 DIAGNOSIS — E1169 Type 2 diabetes mellitus with other specified complication: Secondary | ICD-10-CM

## 2021-05-15 DIAGNOSIS — Z888 Allergy status to other drugs, medicaments and biological substances status: Secondary | ICD-10-CM

## 2021-05-15 DIAGNOSIS — E119 Type 2 diabetes mellitus without complications: Secondary | ICD-10-CM

## 2021-05-15 DIAGNOSIS — R609 Edema, unspecified: Secondary | ICD-10-CM | POA: Diagnosis not present

## 2021-05-15 DIAGNOSIS — I5030 Unspecified diastolic (congestive) heart failure: Secondary | ICD-10-CM | POA: Diagnosis present

## 2021-05-15 DIAGNOSIS — T380X5A Adverse effect of glucocorticoids and synthetic analogues, initial encounter: Secondary | ICD-10-CM | POA: Diagnosis present

## 2021-05-15 DIAGNOSIS — E876 Hypokalemia: Secondary | ICD-10-CM | POA: Diagnosis not present

## 2021-05-15 DIAGNOSIS — M7110 Other infective bursitis, unspecified site: Secondary | ICD-10-CM

## 2021-05-15 DIAGNOSIS — Z8249 Family history of ischemic heart disease and other diseases of the circulatory system: Secondary | ICD-10-CM | POA: Diagnosis not present

## 2021-05-15 DIAGNOSIS — R269 Unspecified abnormalities of gait and mobility: Secondary | ICD-10-CM | POA: Diagnosis not present

## 2021-05-15 DIAGNOSIS — Z951 Presence of aortocoronary bypass graft: Secondary | ICD-10-CM

## 2021-05-15 DIAGNOSIS — M1000 Idiopathic gout, unspecified site: Secondary | ICD-10-CM | POA: Diagnosis not present

## 2021-05-15 DIAGNOSIS — E1165 Type 2 diabetes mellitus with hyperglycemia: Secondary | ICD-10-CM | POA: Diagnosis present

## 2021-05-15 DIAGNOSIS — J449 Chronic obstructive pulmonary disease, unspecified: Secondary | ICD-10-CM | POA: Diagnosis not present

## 2021-05-15 DIAGNOSIS — R54 Age-related physical debility: Secondary | ICD-10-CM | POA: Diagnosis present

## 2021-05-15 DIAGNOSIS — E1122 Type 2 diabetes mellitus with diabetic chronic kidney disease: Secondary | ICD-10-CM | POA: Diagnosis present

## 2021-05-15 DIAGNOSIS — F411 Generalized anxiety disorder: Secondary | ICD-10-CM | POA: Diagnosis present

## 2021-05-15 DIAGNOSIS — M109 Gout, unspecified: Secondary | ICD-10-CM | POA: Diagnosis not present

## 2021-05-15 DIAGNOSIS — R531 Weakness: Secondary | ICD-10-CM

## 2021-05-15 DIAGNOSIS — E1151 Type 2 diabetes mellitus with diabetic peripheral angiopathy without gangrene: Secondary | ICD-10-CM

## 2021-05-15 DIAGNOSIS — Z79899 Other long term (current) drug therapy: Secondary | ICD-10-CM

## 2021-05-15 DIAGNOSIS — M7989 Other specified soft tissue disorders: Secondary | ICD-10-CM | POA: Diagnosis present

## 2021-05-15 LAB — TROPONIN I (HIGH SENSITIVITY)
Troponin I (High Sensitivity): 36 ng/L — ABNORMAL HIGH (ref <18)
Troponin I (High Sensitivity): 37 ng/L — ABNORMAL HIGH (ref <18)

## 2021-05-15 LAB — BASIC METABOLIC PANEL
Anion gap: 11 (ref 5–15)
BUN: 65 mg/dL — ABNORMAL HIGH (ref 8–23)
CO2: 27 mmol/L (ref 22–32)
Calcium: 9.2 mg/dL (ref 8.9–10.3)
Chloride: 101 mmol/L (ref 98–111)
Creatinine, Ser: 3.55 mg/dL — ABNORMAL HIGH (ref 0.61–1.24)
GFR, Estimated: 17 mL/min — ABNORMAL LOW (ref >60)
Glucose, Bld: 150 mg/dL — ABNORMAL HIGH (ref 70–99)
Potassium: 3.2 mmol/L — ABNORMAL LOW (ref 3.5–5.1)
Sodium: 139 mmol/L (ref 135–145)

## 2021-05-15 LAB — CK: Total CK: 138 U/L (ref 49–397)

## 2021-05-15 LAB — CBC
HCT: 31.7 % — ABNORMAL LOW (ref 39.0–52.0)
Hemoglobin: 10.6 g/dL — ABNORMAL LOW (ref 13.0–17.0)
MCH: 28.9 pg (ref 26.0–34.0)
MCHC: 33.4 g/dL (ref 30.0–36.0)
MCV: 86.4 fL (ref 80.0–100.0)
Platelets: 244 10*3/uL (ref 150–400)
RBC: 3.67 MIL/uL — ABNORMAL LOW (ref 4.22–5.81)
RDW: 13.3 % (ref 11.5–15.5)
WBC: 12.9 10*3/uL — ABNORMAL HIGH (ref 4.0–10.5)
nRBC: 0 % (ref 0.0–0.2)

## 2021-05-15 LAB — URINALYSIS, ROUTINE W REFLEX MICROSCOPIC
Bilirubin Urine: NEGATIVE
Glucose, UA: NEGATIVE mg/dL
Hgb urine dipstick: NEGATIVE
Ketones, ur: NEGATIVE mg/dL
Leukocytes,Ua: NEGATIVE
Nitrite: NEGATIVE
Protein, ur: NEGATIVE mg/dL
Specific Gravity, Urine: 1.01 (ref 1.005–1.030)
pH: 5 (ref 5.0–8.0)

## 2021-05-15 LAB — RESP PANEL BY RT-PCR (FLU A&B, COVID) ARPGX2
Influenza A by PCR: NEGATIVE
Influenza B by PCR: NEGATIVE
SARS Coronavirus 2 by RT PCR: NEGATIVE

## 2021-05-15 LAB — CBG MONITORING, ED: Glucose-Capillary: 183 mg/dL — ABNORMAL HIGH (ref 70–99)

## 2021-05-15 MED ORDER — ACETAMINOPHEN 325 MG PO TABS
650.0000 mg | ORAL_TABLET | Freq: Once | ORAL | Status: AC
  Administered 2021-05-15: 650 mg via ORAL

## 2021-05-15 MED ORDER — LIDOCAINE HCL (PF) 1 % IJ SOLN
5.0000 mL | Freq: Once | INTRAMUSCULAR | Status: AC
Start: 2021-05-15 — End: 2021-05-15
  Administered 2021-05-15: 5 mL via INTRADERMAL
  Filled 2021-05-15: qty 5

## 2021-05-15 MED ORDER — POTASSIUM CHLORIDE CRYS ER 20 MEQ PO TBCR
20.0000 meq | EXTENDED_RELEASE_TABLET | Freq: Once | ORAL | Status: AC
Start: 2021-05-15 — End: 2021-05-15
  Administered 2021-05-15: 20 meq via ORAL
  Filled 2021-05-15: qty 1

## 2021-05-15 MED ORDER — LACTATED RINGERS IV BOLUS
1000.0000 mL | Freq: Once | INTRAVENOUS | Status: AC
  Administered 2021-05-16: 1000 mL via INTRAVENOUS

## 2021-05-15 NOTE — ED Provider Triage Note (Signed)
Emergency Medicine Provider Triage Evaluation Note  Harold Pittman , a 81 y.o. male  was evaluated in triage.  Pt complains of generally feeling weak and unwell.  This has been going on since Saturday.  He is chronically short of breath.   No cough.  No new pain,  no urinary symptoms No n/v/  Last BM was today and was normal for him.  No known sick contacts.  No cough.   Physical Exam  BP (!) 108/50 (BP Location: Left Arm)    Pulse 76    Temp 99.5 F (37.5 C) (Oral)    Resp 20    Ht 6\' 1"  (1.854 m)    Wt 80 kg    SpO2 95%    BMI 23.27 kg/m  Gen:   Awake, no distress   Resp:  Normal effort  MSK:   Moves extremities without difficulty  Other:  Normal speech.   Medical Decision Making  Medically screening exam initiated at 8:05 PM.  Appropriate orders placed.  Harold Pittman was informed that the remainder of the evaluation will be completed by another provider, this initial triage assessment does not replace that evaluation, and the importance of remaining in the ED until their evaluation is complete.     Lorin Glass, [place] 05/15/21 2012

## 2021-05-15 NOTE — Assessment & Plan Note (Signed)
To start PT at home soon, may need rollater, ok to continue cane for now

## 2021-05-15 NOTE — Assessment & Plan Note (Addendum)
D/w pt and son natural history, no need today for specific tx except for pillow under the arm to rest and time to heal, or see Sports Med on the first floor for cortisone if desires this for quicker improvement; reassure - no evidence for infection today

## 2021-05-15 NOTE — Progress Notes (Signed)
Pharmacy Antibiotic Note  ARVIN ABELLO is a 81 y.o. male admitted on 05/15/2021 with bursitis.  Pharmacy has been consulted for Vancomycin dosing.  Plan: Vancomycin 1500 mg iv x 1 dose now Vancomycin 1000 mg iv Q 48 hours afterwards Follow up plan, cultures, Scr  Height: 6\' 1"  (185.4 cm) Weight: 80 kg (176 lb 5.9 oz) IBW/kg (Calculated) : 79.9  Temp (24hrs), Avg:99.9 F (37.7 C), Min:98.8 F (37.1 C), Max:101.2 F (38.4 C)  Recent Labs  Lab 05/15/21 1747  WBC 12.9*  CREATININE 3.55*    Estimated Creatinine Clearance: 18.8 mL/min (A) (by C-G formula based on SCr of 3.55 mg/dL (H)).    Allergies  Allergen Reactions   Benazepril Shortness Of Breath    Cough, wheezing    Thank you for allowing pharmacy to be a part of this patients care.  Tad Moore 05/16/2021 12:03 AM

## 2021-05-15 NOTE — Patient Instructions (Signed)
Please keep pressure off the right elbow and it will heal in a matter of weeks  If you like, you can make appt with Sports Medicine on the first floor to have cortisone to the right elbow  Ok to take HALF of the torsemide 100 mg  - which would be taking 50 mg only (half of the 100 mg) per day  Please go to the LAB at the blood drawing area for the tests to be done - at the [facility] in 1 week  You will be contacted by phone if any changes need to be made immediately.  Otherwise, you will receive a letter about your results with an explanation, but please check with MyChart first.  Please continue all other medications as before EXCEPT STOP the Metformin  Please have the pharmacy call with any other refills you may need..  Please keep your appointments with your specialists as you may have planned - Dr Justin Mend for the kidneys as planned (hopefully in about 2-3 months); also the PT at home that has been ordered  You will be contacted regarding the referral for: Dr Tat - neurology to check for Parkinsons  Please see Dr Alain Marion in 2-3 months as well

## 2021-05-15 NOTE — Assessment & Plan Note (Addendum)
Pt states has stopped the metfomin, but this is unclear as this is on his current med list, and I have no renal note to go by; pt is advised to stop metfomrin today at any rate if still taking  Goal a1c at his age can be up to 7.5  Lab Results  Component Value Date   HGBA1C 6.0 05/03/2021

## 2021-05-15 NOTE — Assessment & Plan Note (Signed)
Recent worsening it seems with gait difficutly - for neuro referral r/o parkinsons

## 2021-05-15 NOTE — Assessment & Plan Note (Signed)
Pt with dry mouth and recent worsening Cr and likely taking torsemide 100 mg instead of the 50 mg prescribed; I have no renal notes to review on chart today; I advised pt to take the torsemide HALF pill (50 mg) as prescribed and f/u BMP in 1 wk at the [facility] - no appt needed

## 2021-05-15 NOTE — Progress Notes (Signed)
Patient ID: Harold Pittman, male   DOB: 1940/09/28, 81 y.o.   MRN: [id number]        Chief Complaint: follow up new right elbow pain/swelling, ckd 5, dm, and worsening tremor/gait       HPI:  Harold Pittman is a 81 y.o. male here with son with 1 wk onset worsening right tip of elbow pain/swelling without fever, trauma or hx of same.  Pt sits in favorite chair for hours every day, usually more or less in one position with the arms on the arms of the chair for hours.  Also has mild worsening bilateral UE tremors right > left, whole arms at times but mostly hands with involuntary movements intermittent mild during the day, nothing really makes better or worse.  Pt has dry mouth, and confused about only supposed to be taking HALF of the torsemide and thinks he has been taking the whole pill.  Also metformin is on his med list but son and pt think he has not been taking that.  Cr has been worsening since sept 2022 -   3/0 to last 3.4,  Has seen Dr Justin Mend renal recently but son not sure when next appt or lab is scheduled, as sometime the sister takes him to appointments.   Not yet started PT at home, son wondering if he needs a rollater type walker, no recent falls and walks with cane.  Pt in office wheelchair today.         Wt Readings from Last 3 Encounters:  05/15/21 175 lb (79.4 kg)  05/03/21 170 lb 9.6 oz (77.4 kg)  04/06/21 177 lb 6.4 oz (80.5 kg)   BP Readings from Last 3 Encounters:  05/15/21 136/70  05/03/21 (!) 104/58  04/06/21 131/60         Past Medical History:  Diagnosis Date   Anemia    low iron   Anxiety state, unspecified    Blood in stool    Chronic airway obstruction, not elsewhere classified    Coronary artery disease    Coronary atherosclerosis of artery bypass graft    Dyspnea    ED (erectile dysfunction)    Esophageal reflux    Gout, unspecified    Osteoarthrosis, unspecified whether generalized or localized, unspecified site    Other and unspecified hyperlipidemia     Other specified disorder of rectum and anus    Personal history of tobacco use, presenting hazards to health    Sleep apnea    does not use cpap   Type II or unspecified type diabetes mellitus without mention of complication, not stated as uncontrolled    Unspecified essential hypertension    Past Surgical History:  Procedure Laterality Date   BIOPSY  09/26/2017   Procedure: BIOPSY;  Surgeon: Ladene Artist, MD;  Location: [place];  Service: Endoscopy;;   BIOPSY  02/03/2018   Procedure: BIOPSY;  Surgeon: Irving Copas., MD;  Location: [facility] ENDOSCOPY;  Service: Gastroenterology;;   COLONOSCOPY WITH PROPOFOL N/A 09/26/2017   Procedure: COLONOSCOPY WITH PROPOFOL;  Surgeon: Ladene Artist, MD;  Location: [facility] ENDOSCOPY;  Service: Endoscopy;  Laterality: N/A;   CORONARY ARTERY BYPASS GRAFT  12   LIMA to LAD, sequential saphenous vein graft to the first and second diagonal, sequential saphenous vein graft to the intermediate OM and circumflex and SVG to RCA   ESOPHAGOGASTRODUODENOSCOPY (EGD) WITH PROPOFOL N/A 09/26/2017   Procedure: ESOPHAGOGASTRODUODENOSCOPY (EGD) WITH PROPOFOL;  Surgeon: Ladene Artist, MD;  Location: [facility]  ENDOSCOPY;  Service: Endoscopy;  Laterality: N/A;   ESOPHAGOGASTRODUODENOSCOPY (EGD) WITH PROPOFOL N/A 02/03/2018   Procedure: ESOPHAGOGASTRODUODENOSCOPY (EGD);  Surgeon: Irving Copas., MD;  Location: [place];  Service: Gastroenterology;  Laterality: N/A;   KNEE SURGERY     BILATERAL   POLYPECTOMY  09/26/2017   Procedure: POLYPECTOMY;  Surgeon: Ladene Artist, MD;  Location: [place];  Service: Endoscopy;;   [place] INJECTION  02/03/2018   Procedure: SUBMUCOSAL INJECTION;  Surgeon: Irving Copas., MD;  Location: [place];  Service: Gastroenterology;;    reports that he has quit smoking. He has never used smokeless tobacco. He reports that he does not drink alcohol and does not use drugs. family history  includes Coronary artery disease in an other family member; Depression in his sister; Heart disease in his sister; Heart disease (age of onset: 61) in his mother; Hypertension in an other family member; Mental illness in his father. Allergies  Allergen Reactions   Benazepril Shortness Of Breath    Cough, wheezing   Current Outpatient Medications on File Prior to Visit  Medication Sig Dispense Refill   amLODipine (NORVASC) 10 MG tablet Take 1 tablet (10 mg total) by mouth daily. 90 tablet 3   aspirin 81 MG EC tablet Take 1 tablet (81 mg total) by mouth daily. 100 tablet 3   Blood Glucose Monitoring Suppl (ONETOUCH VERIO FLEX SYSTEM) w/Device KIT USE TO MONITOR BLOOD SUGAR 1 kit 0   carvedilol (COREG) 25 MG tablet TAKE 1 TABLET BY MOUTH  TWICE DAILY WITH A MEAL 180 tablet 3   Cholecalciferol (VITAMIN D3) 50 MCG (2000 UT) capsule Take 1 capsule (2,000 Units total) by mouth daily. 100 capsule 3   colchicine 0.6 MG tablet Take 1 tablet (0.6 mg total) by mouth 2 (two) times daily as needed. 30 tablet 0   Cyanocobalamin (VITAMIN B-12) 1000 MCG SUBL Place 1 tablet (1,000 mcg total) under the tongue daily. 100 tablet 3   cyclobenzaprine (FLEXERIL) 10 MG tablet Take 1 tablet (10 mg total) by mouth at bedtime. 90 tablet 3   docusate sodium (COLACE) 100 MG capsule Take 1 capsule (100 mg total) by mouth 2 (two) times daily. 10 capsule 0   famotidine (PEPCID) 40 MG tablet Take 1 tablet (40 mg total) by mouth daily. 90 tablet 3   gabapentin (NEURONTIN) 300 MG capsule Take 300 mg by mouth 3 (three) times daily.     glucose blood (ONETOUCH VERIO) test strip CHECK BLOOD SUGAR 1 TO 2  TIMES DAILY AS DIRECTED 200 strip 3   hydrALAZINE (APRESOLINE) 50 MG tablet TAKE 1 TABLET BY MOUTH 3  TIMES DAILY 270 tablet 3   HYDROcodone-acetaminophen (NORCO) 5-325 MG tablet Take 1 tablet by mouth every 6 (six) hours as needed for moderate pain. 20 tablet 0   irbesartan (AVAPRO) 150 MG tablet Take 0.5 tablets (75 mg total) by  mouth daily. 45 tablet 3   Lancets (ONETOUCH DELICA PLUS [id number]) MISC USE AS DIRECTED 200 each 3   omeprazole (PRILOSEC) 40 MG capsule TAKE 1 CAPSULE BY MOUTH  DAILY 90 capsule 3   ondansetron (ZOFRAN) 4 MG tablet Take 1 tablet (4 mg total) by mouth every 8 (eight) hours as needed for nausea or vomiting. 21 tablet 1   potassium chloride (KLOR-CON) 10 MEQ tablet TAKE 1 TABLET(10 MEQ) BY MOUTH DAILY 7 tablet 0   pravastatin (PRAVACHOL) 80 MG tablet TAKE 1 TABLET BY MOUTH AT  BEDTIME 90  tablet 3   tadalafil (ADCIRCA/CIALIS) 20 MG tablet Take 20 mg by mouth as needed for erectile dysfunction.     torsemide (DEMADEX) 100 MG tablet Take 0.5 tablets (50 mg total) by mouth daily. (Patient taking differently: Take 100 mg by mouth daily.) 90 tablet 3   ferrous sulfate 325 (65 FE) MG tablet Take 1 tablet (325 mg total) by mouth daily. 90 tablet 3   No current facility-administered medications on file prior to visit.        ROS:  All others reviewed and negative.  Objective        PE:  BP 136/70 (BP Location: Left Arm, Patient Position: Sitting, Cuff Size: Large)    Pulse 73    Temp 98.8 F (37.1 C) (Oral)    Ht 6' 1" (1.854 m)    Wt 175 lb (79.4 kg)    SpO2 98%    BMI 23.09 kg/m                 Constitutional: Pt appears in NAD               HENT: Head: NCAT.                Right Ear: External ear normal.                 Left Ear: External ear normal.                Eyes: . Pupils are equal, round, and reactive to light. Conjunctivae and EOM are normal               Nose: without d/c or deformity               Neck: Neck supple. Gross normal ROM               Cardiovascular: Normal rate and regular rhythm.                 Pulmonary/Chest: Effort normal and breath sounds without rales or wheezing.                Abd:  Soft, NT, ND, + BS, no organomegaly               Right elbow with 2+ bursa swelling, mild warm and tender               Neurological: Pt is alert. At baseline orientation, motor  grossly intact, + bilateral UE tremors               Skin: Skin is warm. No rashes, no other new lesions, LE edema - none               Psychiatric: Pt behavior is normal without agitation   Micro: none  Cardiac tracings I have personally interpreted today:  none  Pertinent Radiological findings (summarize): none   Lab Results  Component Value Date   WBC 4.9 03/13/2021   HGB 10.4 (L) 03/13/2021   HCT 30.8 (L) 03/13/2021   PLT 202 03/13/2021   GLUCOSE 112 (H) 05/03/2021   CHOL 120 07/21/2019   TRIG 68.0 07/21/2019   HDL 31.70 (L) 07/21/2019   LDLCALC 75 07/21/2019   ALT 20 05/03/2021   AST 26 05/03/2021   NA 143 05/03/2021   K 3.7 05/03/2021   CL 102 05/03/2021   CREATININE 3.43 (H) 05/03/2021   BUN 65 (H) 05/03/2021   CO2 30 05/03/2021  TSH 0.48 05/03/2021   PSA 8.92 (H) 04/22/2019   INR 1.35 07/23/2015   HGBA1C 6.0 05/03/2021   MICROALBUR 38.7 (H) 11/24/2013   Assessment/Plan:  Harold Pittman is a 81 y.o. Black or African American [2] male with  has a past medical history of Anemia, Anxiety state, unspecified, Blood in stool, Chronic airway obstruction, not elsewhere classified, Coronary artery disease, Coronary atherosclerosis of artery bypass graft, Dyspnea, ED (erectile dysfunction), Esophageal reflux, Gout, unspecified, Osteoarthrosis, unspecified whether generalized or localized, unspecified site, Other and unspecified hyperlipidemia, Other specified disorder of rectum and anus, Personal history of tobacco use, presenting hazards to health, Sleep apnea, Type II or unspecified type diabetes mellitus without mention of complication, not stated as uncontrolled, and Unspecified essential hypertension.  Gait disorder To start PT at home soon, may need rollater, ok to continue cane for now  CKD (chronic kidney disease) stage 4, GFR 15-29 ml/min (HCC) Pt with dry mouth and recent worsening Cr and likely taking torsemide 100 mg instead of the 50 mg prescribed; I have no  renal notes to review on chart today; I advised pt to take the torsemide HALF pill (50 mg) as prescribed and f/u BMP in 1 wk at the [facility] - no appt needed  Olecranon bursitis, right elbow D/w pt and son natural history, no need today for specific tx except for pillow under the arm to rest and time to heal, or see Sports Med on the first floor for cortisone if desires this for quicker improvement; reassure - no evidence for infection today  DM (diabetes mellitus), type 2 with peripheral vascular complications ([place]) Pt states has stopped the metfomin, but this is unclear as this is on his current med list, and I have no renal note to go by; pt is advised to stop metfomrin today at any rate if still taking  Goal a1c at his age can be up to 7.5  Lab Results  Component Value Date   HGBA1C 6.0 05/03/2021    Tremor Recent worsening it seems with gait difficutly - for neuro referral r/o parkinsons  Followup: Return if symptoms worsen or fail to improve.  Cathlean Cower, MD 05/15/2021 1:00 PM [facility]

## 2021-05-15 NOTE — ED Triage Notes (Signed)
Pt arrives POV for eval of generalized weakness,and R arm pain. Pt reports onset of weakness on Saturday, reports arm pain x weeks. States he feels poorly, vague complaint. Febrile in triage, no sick contacts.

## 2021-05-15 NOTE — ED Provider Notes (Signed)
[facility] Emergency Department Provider Note MRN:  [id number]  Arrival date & time: 05/16/21     Chief Complaint   Weakness   History of Present Illness   Harold Pittman is a 81 y.o. year-old male with a history of Anemia, CAD, Osteoarthritis presenting to the ED with chief complaint of weakness.  81 year old male presents to the emergency department for evaluation of weakness.  The patient was seen at his primary care physician's office to who did not address this problem.  Patient states that he has difficulty ambulating to care for himself at home.  Patient states that he was unaware that he had fevers until he arrived at the emergency department today.  Denies that he has had cough, shortness of breath, chest pain, abdominal pain.  The patient does report that he has had prior history of olecranon bursitis.  Patient states that he has never had associated pain with movements of his arm.  Patient states that today he has had pain with arm movements as well has noticed that his arm feels more warm.   Review of Systems  A thorough review of systems was obtained and all systems are negative except as noted in the HPI and PMH.   Patient's Health History    Past Medical History:  Diagnosis Date   Anemia    low iron   Anxiety state, unspecified    Blood in stool    Chronic airway obstruction, not elsewhere classified    Coronary artery disease    Coronary atherosclerosis of artery bypass graft    Dyspnea    ED (erectile dysfunction)    Esophageal reflux    Gout, unspecified    Osteoarthrosis, unspecified whether generalized or localized, unspecified site    Other and unspecified hyperlipidemia    Other specified disorder of rectum and anus    Personal history of tobacco use, presenting hazards to health    Sleep apnea    does not use cpap   Type II or unspecified type diabetes mellitus without mention of complication, not stated as uncontrolled     Unspecified essential hypertension     Past Surgical History:  Procedure Laterality Date   BIOPSY  09/26/2017   Procedure: BIOPSY;  Surgeon: Ladene Artist, MD;  Location: [place];  Service: Endoscopy;;   BIOPSY  02/03/2018   Procedure: BIOPSY;  Surgeon: Irving Copas., MD;  Location[facility]COPY;  Service: Gastroenterology;;   COLONOSCOPY WITH PROPOFOL N/A 09/26/2017   Procedure: COLONOSCOPY WITH PROPOFOL;  Surgeon: Ladene Artist, MD;  Location: [facility] ENDOSCOPY;  Service: Endoscopy;  Laterality: N/A;   CORONARY ARTERY BYPASS GRAFT  77   LIMA to LAD, sequential saphenous vein graft to the first and second diagonal, sequential saphenous vein graft to the intermediate OM and circumflex and SVG to RCA   ESOPHAGOGASTRODUODENOSCOPY (EGD) WITH PROPOFOL N/A 09/26/2017   Procedure: ESOPHAGOGASTRODUODENOSCOPY (EGD) WITH PROPOFOL;  Surgeon: Ladene Artist, MD;  Location: [facility] ENDOSCOPY;  Service: Endoscopy;  Laterality: N/A;   ESOPHAGOGASTRODUODENOSCOPY (EGD) WITH PROPOFOL N/A 02/03/2018   Procedure: ESOPHAGOGASTRODUODENOSCOPY (EGD);  Surgeon: Irving Copas., MD;  Location: [place];  Service: Gastroenterology;  Laterality: N/A;   KNEE SURGERY     BILATERAL   POLYPECTOMY  09/26/2017   Procedure: POLYPECTOMY;  Surgeon: Ladene Artist, MD;  Location: [facility] ENDOSCOPY;  Service: Endoscopy;;   [place] INJECTION  02/03/2018   Procedure: SUBMUCOSAL INJECTION;  Surgeon: Justice Britain  Brooke Bonito., MD;  Location: [facility] ENDOSCOPY;  Service: Gastroenterology;;    Family History  Problem Relation Age of Onset   Depression Sister    Heart disease Sister    Heart disease Mother 78       CAD   Mental illness Father        Alzheimer's   Hypertension Other    Coronary artery disease Other        Male 1st degree relative <50   Colon cancer Neg Hx    Stomach cancer Neg Hx    Esophageal cancer Neg Hx    Liver disease Neg Hx    Pancreatic cancer Neg Hx    Rectal  cancer Neg Hx     Social History   Socioeconomic History   Marital status: Widowed    Spouse name: Not on file   Number of children: Not on file   Years of education: Not on file   Highest education level: Not on file  Occupational History   Occupation: RETIRED  Tobacco Use   Smoking status: Former   Smokeless tobacco: Never   Tobacco comments:    Stopped 1997  Vaping Use   Vaping Use: Never used  Substance and Sexual Activity   Alcohol use: No    Comment: Stopped 1997   Drug use: No   Sexual activity: Not Currently  Other Topics Concern   Not on file  Social History Narrative   Patient does not get regular exercise   Daily Caffeine use - 6      Wife decease 2005-06-24;       Social Determinants of Health   Financial Resource Strain: Not on file  Food Insecurity: Not on file  Transportation Needs: Not on file  Physical Activity: Not on file  Stress: Not on file  Social Connections: Not on file  Intimate Partner Violence: Not on file     Physical Exam   Physical Exam Vitals and nursing note reviewed.  Constitutional:      Appearance: Normal appearance.  Cardiovascular:     Rate and Rhythm: Normal rate and regular rhythm.  Pulmonary:     Effort: Pulmonary effort is normal.     Breath sounds: Normal breath sounds.  Chest:     Chest wall: No tenderness.  Abdominal:     General: Abdomen is flat. There is no distension.     Palpations: Abdomen is soft.     Tenderness: There is no abdominal tenderness.  Musculoskeletal:        General: Swelling, tenderness and deformity present.     Comments: Was a warm swollen right elbow.  Skin:    Capillary Refill: Capillary refill takes less than 2 seconds.     Findings: Erythema present.  Neurological:     General: No focal deficit present.     Mental Status: He is alert and oriented to person, place, and time. Mental status is at baseline.     Cranial Nerves: No cranial nerve deficit.     Sensory: No sensory deficit.      Motor: Weakness (4-5 weakness in his lower extremities) present.      Diagnostic and Interventional Summary    EKG Interpretation  Date/Time:  Monday May 15 2021 20:15:58 EST Ventricular Rate:  70 PR Interval:  196 QRS Duration: 98 QT Interval:  410 QTC Calculation: 442 R Axis:   39 Text Interpretation: Normal sinus rhythm Minimal voltage criteria for LVH, may be normal variant ( R in aVL )  Nonspecific T wave abnormality Abnormal ECG When compared with ECG of 29-Nov-2020 10:24, PREVIOUS ECG IS PRESENT Confirmed by Elnora Morrison [phone]) on 05/15/2021 8:51:39 PM       Labs Reviewed  BASIC METABOLIC PANEL - Abnormal; Notable for the following components:      Result Value   Potassium 3.2 (*)    Glucose, Bld 150 (*)    BUN 65 (*)    Creatinine, Ser 3.55 (*)    GFR, Estimated 17 (*)    All other components within normal limits  CBC - Abnormal; Notable for the following components:   WBC 12.9 (*)    RBC 3.67 (*)    Hemoglobin 10.6 (*)    HCT 31.7 (*)    All other components within normal limits  CBG MONITORING, ED - Abnormal; Notable for the following components:   Glucose-Capillary 183 (*)    All other components within normal limits  CBG MONITORING, ED - Abnormal; Notable for the following components:   Glucose-Capillary 126 (*)    All other components within normal limits  TROPONIN I (HIGH SENSITIVITY) - Abnormal; Notable for the following components:   Troponin I (High Sensitivity) 36 (*)    All other components within normal limits  TROPONIN I (HIGH SENSITIVITY) - Abnormal; Notable for the following components:   Troponin I (High Sensitivity) 37 (*)    All other components within normal limits  RESP PANEL BY RT-PCR (FLU A&B, COVID) ARPGX2  URINE CULTURE  BODY FLUID CULTURE W GRAM STAIN  CULTURE, BLOOD (ROUTINE X 2)  CULTURE, BLOOD (ROUTINE X 2)  URINALYSIS, ROUTINE W REFLEX MICROSCOPIC  CK  SYNOVIAL CELL COUNT + DIFF, W/ CRYSTALS  GLUCOSE, BODY FLUID OTHER               DG Chest 2 View  Final Result      Medications  lactated ringers bolus 1,000 mL (has no administration in time range)  vancomycin (VANCOREADY) IVPB 1500 mg/300 mL (has no administration in time range)  acetaminophen (TYLENOL) tablet 650 mg (650 mg Oral Given 05/15/21 1741)  potassium chloride SA (KLOR-CON M) CR tablet 20 mEq (20 mEq Oral Given 05/15/21 2212)  lidocaine (PF) (XYLOCAINE) 1 % injection 5 mL (5 mLs Intradermal Given by Other 05/15/21 2335)     Procedures  /  Critical Care .Joint Aspiration/Arthrocentesis  Date/Time: 05/16/2021 12:47 AM Performed by: Zachery Dakins, MD Authorized by: Shela Leff, MD   Consent:    Consent obtained:  Verbal   Consent given by:  Patient   Risks discussed:  Bleeding, infection, pain, poor cosmetic result and incomplete drainage   Alternatives discussed:  No treatment Universal protocol:    Patient identity confirmed:  Verbally with patient Location:    Location:  Elbow   Elbow:  R elbow Anesthesia:    Anesthesia method:  Local infiltration   Local anesthetic:  Lidocaine 1% w/o epi Procedure details:    Preparation: Patient was prepped and draped in usual sterile fashion     Needle gauge:  18 G   Ultrasound guidance: no     Approach:  Lateral   Aspirate amount:  10   Aspirate characteristics:  Cloudy   Steroid injected: no     Specimen collected: yes   Post-procedure details:    Dressing:  Adhesive bandage   Procedure completion:  Tolerated well, no immediate complications  ED Course and Medical Decision Making  Initial Impression and Ddx 81 year old male presents to emergency department for  evaluation of weakness.  Differential diagnosis for the patient's weakness includes presenting to the following: Myositis, polymyalgia rheumatica, electrolyte abnormality, anemia.  As for the patient's right swollen elbow have concern for possible joint infection versus septic bursitis, versus nonseptic bursitis.  Joint  aspiration performed as per above.  Past medical/surgical history that increases complexity of ED encounter: As per above  Interpretation of Diagnostics I personally reviewed the EKG, Chest Xray, and Cardiac Monitor and my interpretation is as follows: Chest x-ray without acute cardiopulmonary abnormality.  EKG was normal sinus rhythm with T wave changes however no ST elevation present.  Patient is in normal sinus rhythm on the cardiac monitor     Urine without evidence of infection.  COVID/flu negative.  Patient does have a leukocytosis with neutrophilic predominance.  The patient's creatinine is elevated but at his baseline.  Troponin x2 markable.  Low suspicion for ACS given the patient's presentation  After aspiration was performed the fluid was sent to the lab for cell count and culture.  That the patient is febrile I feel as if this is likely secondary to systemic infection will obtain blood cultures x2 and start the patient on vancomycin.  Patient Reassessment and Ultimate Disposition/Management Hospitalist consulted for management of septic bursitis is well as weakness.  They agreed to admit the patient to their service for further management.  Please see their note for further details.  Patient management required discussion with the following services or consulting groups:  Hospitalist Service  Complexity of Problems Addressed Acute illness or injury that poses threat of life of bodily function  Additional Data Reviewed and Analyzed Further history obtained from: Recent PCP notes  Factors Impacting ED Encounter Risk Consideration of hospitalization    Final Clinical Impressions(s) / ED Diagnoses     ICD-10-CM   1. Septic bursitis  M71.10       ED Discharge Orders     None        Discharge Instructions Discussed with and Provided to Patient:   Discharge Instructions   None       Zachery Dakins, MD 05/16/21 9811    Elnora Morrison, MD 05/18/21  [phone]

## 2021-05-16 ENCOUNTER — Ambulatory Visit: Payer: Medicare Other | Admitting: Family Medicine

## 2021-05-16 ENCOUNTER — Other Ambulatory Visit: Payer: Self-pay | Admitting: Internal Medicine

## 2021-05-16 DIAGNOSIS — Z820 Family history of epilepsy and other diseases of the nervous system: Secondary | ICD-10-CM | POA: Diagnosis not present

## 2021-05-16 DIAGNOSIS — D72829 Elevated white blood cell count, unspecified: Secondary | ICD-10-CM | POA: Diagnosis present

## 2021-05-16 DIAGNOSIS — Z951 Presence of aortocoronary bypass graft: Secondary | ICD-10-CM | POA: Diagnosis not present

## 2021-05-16 DIAGNOSIS — R531 Weakness: Secondary | ICD-10-CM | POA: Diagnosis not present

## 2021-05-16 DIAGNOSIS — M1000 Idiopathic gout, unspecified site: Secondary | ICD-10-CM

## 2021-05-16 DIAGNOSIS — R609 Edema, unspecified: Secondary | ICD-10-CM | POA: Diagnosis not present

## 2021-05-16 DIAGNOSIS — E119 Type 2 diabetes mellitus without complications: Secondary | ICD-10-CM

## 2021-05-16 DIAGNOSIS — R54 Age-related physical debility: Secondary | ICD-10-CM | POA: Diagnosis present

## 2021-05-16 DIAGNOSIS — E1165 Type 2 diabetes mellitus with hyperglycemia: Secondary | ICD-10-CM | POA: Diagnosis present

## 2021-05-16 DIAGNOSIS — N184 Chronic kidney disease, stage 4 (severe): Secondary | ICD-10-CM | POA: Diagnosis present

## 2021-05-16 DIAGNOSIS — I10 Essential (primary) hypertension: Secondary | ICD-10-CM | POA: Diagnosis not present

## 2021-05-16 DIAGNOSIS — E876 Hypokalemia: Secondary | ICD-10-CM | POA: Diagnosis present

## 2021-05-16 DIAGNOSIS — M7110 Other infective bursitis, unspecified site: Secondary | ICD-10-CM | POA: Insufficient documentation

## 2021-05-16 DIAGNOSIS — Z20822 Contact with and (suspected) exposure to covid-19: Secondary | ICD-10-CM | POA: Diagnosis present

## 2021-05-16 DIAGNOSIS — Z6822 Body mass index (BMI) 22.0-22.9, adult: Secondary | ICD-10-CM | POA: Diagnosis not present

## 2021-05-16 DIAGNOSIS — F411 Generalized anxiety disorder: Secondary | ICD-10-CM | POA: Diagnosis present

## 2021-05-16 DIAGNOSIS — T380X5A Adverse effect of glucocorticoids and synthetic analogues, initial encounter: Secondary | ICD-10-CM | POA: Diagnosis present

## 2021-05-16 DIAGNOSIS — M109 Gout, unspecified: Secondary | ICD-10-CM | POA: Diagnosis present

## 2021-05-16 DIAGNOSIS — I5032 Chronic diastolic (congestive) heart failure: Secondary | ICD-10-CM | POA: Diagnosis present

## 2021-05-16 DIAGNOSIS — E1122 Type 2 diabetes mellitus with diabetic chronic kidney disease: Secondary | ICD-10-CM | POA: Diagnosis present

## 2021-05-16 DIAGNOSIS — M71121 Other infective bursitis, right elbow: Secondary | ICD-10-CM | POA: Diagnosis present

## 2021-05-16 DIAGNOSIS — I2510 Atherosclerotic heart disease of native coronary artery without angina pectoris: Secondary | ICD-10-CM | POA: Diagnosis present

## 2021-05-16 DIAGNOSIS — Z888 Allergy status to other drugs, medicaments and biological substances status: Secondary | ICD-10-CM | POA: Diagnosis not present

## 2021-05-16 DIAGNOSIS — T502X5A Adverse effect of carbonic-anhydrase inhibitors, benzothiadiazides and other diuretics, initial encounter: Secondary | ICD-10-CM | POA: Diagnosis present

## 2021-05-16 DIAGNOSIS — J449 Chronic obstructive pulmonary disease, unspecified: Secondary | ICD-10-CM | POA: Diagnosis present

## 2021-05-16 DIAGNOSIS — Z818 Family history of other mental and behavioral disorders: Secondary | ICD-10-CM | POA: Diagnosis not present

## 2021-05-16 DIAGNOSIS — N179 Acute kidney failure, unspecified: Secondary | ICD-10-CM | POA: Diagnosis present

## 2021-05-16 DIAGNOSIS — Z7982 Long term (current) use of aspirin: Secondary | ICD-10-CM | POA: Diagnosis not present

## 2021-05-16 DIAGNOSIS — Z8249 Family history of ischemic heart disease and other diseases of the circulatory system: Secondary | ICD-10-CM | POA: Diagnosis not present

## 2021-05-16 DIAGNOSIS — I130 Hypertensive heart and chronic kidney disease with heart failure and stage 1 through stage 4 chronic kidney disease, or unspecified chronic kidney disease: Secondary | ICD-10-CM | POA: Diagnosis present

## 2021-05-16 LAB — CBG MONITORING, ED
Glucose-Capillary: 126 mg/dL — ABNORMAL HIGH (ref 70–99)
Glucose-Capillary: 143 mg/dL — ABNORMAL HIGH (ref 70–99)
Glucose-Capillary: 146 mg/dL — ABNORMAL HIGH (ref 70–99)
Glucose-Capillary: 160 mg/dL — ABNORMAL HIGH (ref 70–99)

## 2021-05-16 LAB — GLUCOSE, BODY FLUID OTHER: Glucose, Body Fluid Other: 79

## 2021-05-16 LAB — CBC
HCT: 30.7 % — ABNORMAL LOW (ref 39.0–52.0)
Hemoglobin: 10.1 g/dL — ABNORMAL LOW (ref 13.0–17.0)
MCH: 28.5 pg (ref 26.0–34.0)
MCHC: 32.9 g/dL (ref 30.0–36.0)
MCV: 86.7 fL (ref 80.0–100.0)
Platelets: 208 10*3/uL (ref 150–400)
RBC: 3.54 MIL/uL — ABNORMAL LOW (ref 4.22–5.81)
RDW: 13.2 % (ref 11.5–15.5)
WBC: 10.2 10*3/uL (ref 4.0–10.5)
nRBC: 0 % (ref 0.0–0.2)

## 2021-05-16 LAB — BASIC METABOLIC PANEL
Anion gap: 11 (ref 5–15)
BUN: 62 mg/dL — ABNORMAL HIGH (ref 8–23)
CO2: 27 mmol/L (ref 22–32)
Calcium: 9.2 mg/dL (ref 8.9–10.3)
Chloride: 103 mmol/L (ref 98–111)
Creatinine, Ser: 3.33 mg/dL — ABNORMAL HIGH (ref 0.61–1.24)
GFR, Estimated: 18 mL/min — ABNORMAL LOW (ref >60)
Glucose, Bld: 146 mg/dL — ABNORMAL HIGH (ref 70–99)
Potassium: 3.4 mmol/L — ABNORMAL LOW (ref 3.5–5.1)
Sodium: 141 mmol/L (ref 135–145)

## 2021-05-16 LAB — SYNOVIAL CELL COUNT + DIFF, W/ CRYSTALS
Eosinophils-Synovial: 0 % (ref 0–1)
Lymphocytes-Synovial Fld: 0 % (ref 0–20)
Monocyte-Macrophage-Synovial Fluid: 19 % — ABNORMAL LOW (ref 50–90)
Neutrophil, Synovial: 81 % — ABNORMAL HIGH (ref 0–25)
WBC, Synovial: 3900 /mm3 — ABNORMAL HIGH (ref 0–200)

## 2021-05-16 LAB — MAGNESIUM: Magnesium: 2 mg/dL (ref 1.7–2.4)

## 2021-05-16 MED ORDER — ENOXAPARIN SODIUM 40 MG/0.4ML IJ SOSY: 40.0000 mg | PREFILLED_SYRINGE | INTRAMUSCULAR | Status: DC

## 2021-05-16 MED ORDER — INSULIN ASPART 100 UNIT/ML IJ SOLN
0.0000 [IU] | Freq: Every day | INTRAMUSCULAR | Status: DC
  Administered 2021-05-16: 2 [IU] via SUBCUTANEOUS

## 2021-05-16 MED ORDER — ACETAMINOPHEN 650 MG RE SUPP: 650.0000 mg | Freq: Four times a day (QID) | RECTAL | Status: DC | PRN

## 2021-05-16 MED ORDER — PRAVASTATIN SODIUM 40 MG PO TABS
80.0000 mg | ORAL_TABLET | Freq: Every day | ORAL | Status: DC
  Administered 2021-05-16 – 2021-05-17 (×2): 80 mg via ORAL
  Filled 2021-05-16 (×2): qty 2

## 2021-05-16 MED ORDER — GABAPENTIN 300 MG PO CAPS
300.0000 mg | ORAL_CAPSULE | Freq: Three times a day (TID) | ORAL | Status: DC
  Administered 2021-05-16 (×2): 300 mg via ORAL
  Filled 2021-05-16 (×2): qty 1

## 2021-05-16 MED ORDER — ACETAMINOPHEN 325 MG PO TABS
650.0000 mg | ORAL_TABLET | Freq: Four times a day (QID) | ORAL | Status: DC | PRN
  Administered 2021-05-17: 650 mg via ORAL
  Filled 2021-05-16: qty 2

## 2021-05-16 MED ORDER — VANCOMYCIN HCL 1500 MG/300ML IV SOLN
1500.0000 mg | Freq: Once | INTRAVENOUS | Status: AC
  Administered 2021-05-16: 1500 mg via INTRAVENOUS
  Filled 2021-05-16: qty 300

## 2021-05-16 MED ORDER — ASPIRIN EC 81 MG PO TBEC
81.0000 mg | DELAYED_RELEASE_TABLET | Freq: Every day | ORAL | Status: DC
  Administered 2021-05-16 – 2021-05-18 (×3): 81 mg via ORAL
  Filled 2021-05-16 (×3): qty 1

## 2021-05-16 MED ORDER — HYDRALAZINE HCL 50 MG PO TABS
50.0000 mg | ORAL_TABLET | Freq: Three times a day (TID) | ORAL | Status: DC
  Administered 2021-05-16 – 2021-05-18 (×6): 50 mg via ORAL
  Filled 2021-05-16: qty 1
  Filled 2021-05-16: qty 2
  Filled 2021-05-16 (×4): qty 1

## 2021-05-16 MED ORDER — AMLODIPINE BESYLATE 10 MG PO TABS
10.0000 mg | ORAL_TABLET | Freq: Every day | ORAL | Status: DC
  Administered 2021-05-16 – 2021-05-18 (×3): 10 mg via ORAL
  Filled 2021-05-16 (×2): qty 1
  Filled 2021-05-16: qty 2

## 2021-05-16 MED ORDER — INSULIN ASPART 100 UNIT/ML IJ SOLN
0.0000 [IU] | Freq: Three times a day (TID) | INTRAMUSCULAR | Status: DC
  Administered 2021-05-16 – 2021-05-17 (×3): 1 [IU] via SUBCUTANEOUS
  Administered 2021-05-17: 3 [IU] via SUBCUTANEOUS
  Administered 2021-05-17 – 2021-05-18 (×2): 1 [IU] via SUBCUTANEOUS

## 2021-05-16 MED ORDER — PANTOPRAZOLE SODIUM 40 MG PO TBEC
40.0000 mg | DELAYED_RELEASE_TABLET | Freq: Every day | ORAL | Status: DC
  Administered 2021-05-16 – 2021-05-18 (×3): 40 mg via ORAL
  Filled 2021-05-16 (×3): qty 1

## 2021-05-16 MED ORDER — TORSEMIDE 20 MG PO TABS: 50.0000 mg | ORAL_TABLET | Freq: Every day | ORAL | Status: DC

## 2021-05-16 MED ORDER — POTASSIUM CHLORIDE CRYS ER 20 MEQ PO TBCR
40.0000 meq | EXTENDED_RELEASE_TABLET | Freq: Once | ORAL | Status: AC
  Administered 2021-05-16: 40 meq via ORAL
  Filled 2021-05-16: qty 2

## 2021-05-16 MED ORDER — HEPARIN SODIUM (PORCINE) 5000 UNIT/ML IJ SOLN
5000.0000 [IU] | Freq: Three times a day (TID) | INTRAMUSCULAR | Status: DC
  Administered 2021-05-16 – 2021-05-18 (×6): 5000 [IU] via SUBCUTANEOUS
  Filled 2021-05-16 (×6): qty 1

## 2021-05-16 MED ORDER — PREDNISONE 20 MG PO TABS
40.0000 mg | ORAL_TABLET | Freq: Every day | ORAL | Status: DC
  Administered 2021-05-17 – 2021-05-18 (×2): 40 mg via ORAL
  Filled 2021-05-16: qty 2

## 2021-05-16 MED ORDER — CARVEDILOL 25 MG PO TABS
25.0000 mg | ORAL_TABLET | Freq: Two times a day (BID) | ORAL | Status: DC
  Administered 2021-05-16 – 2021-05-18 (×5): 25 mg via ORAL
  Filled 2021-05-16: qty 2
  Filled 2021-05-16 (×3): qty 1
  Filled 2021-05-16: qty 8

## 2021-05-16 MED ORDER — IRBESARTAN 75 MG PO TABS
75.0000 mg | ORAL_TABLET | Freq: Every day | ORAL | Status: DC
  Filled 2021-05-16: qty 1

## 2021-05-16 MED ORDER — VANCOMYCIN HCL IN DEXTROSE 1-5 GM/200ML-% IV SOLN: 1000.0000 mg | INTRAVENOUS | Status: DC

## 2021-05-16 MED ORDER — FAMOTIDINE 20 MG PO TABS
40.0000 mg | ORAL_TABLET | Freq: Every day | ORAL | Status: DC
Start: 2021-05-16 — End: 2021-05-16

## 2021-05-16 MED ORDER — SODIUM CHLORIDE 0.9 % IV SOLN: INTRAVENOUS | Status: AC

## 2021-05-16 NOTE — Assessment & Plan Note (Addendum)
BP stable on amlodipine, Coreg and hydralazine.  Resuming torsemide-since BP stable-We will continue to hold irbesartan for now.  PCP to reassess at next visit.

## 2021-05-16 NOTE — Assessment & Plan Note (Addendum)
Repleted-recheck periodically.

## 2021-05-16 NOTE — Assessment & Plan Note (Signed)
Febrile and labs showing mild leukocytosis.  Underwent aspiration with 10 mL cloudy fluid removed: 3900 WBCs (81% neutrophils) concerning for infection, culture pending.  Vital signs stable at this time, no signs of sepsis. -Continue vancomycin and follow cultures.  Monitor WBC count.  Pain management.

## 2021-05-16 NOTE — ED Notes (Signed)
Harold Pittman, granddaughter, [phone], okay to call with updates

## 2021-05-16 NOTE — Assessment & Plan Note (Addendum)
Per patient-he has a known history of gout-review of medications reveals that he is only on colchicine as needed.  See above-continue tapering prednisone on discharge.

## 2021-05-16 NOTE — Progress Notes (Deleted)
I, Peterson Lombard, LAT, ATC acting as a scribe for Lynne Leader, MD. Subjective:    CC: R elbow pain  HPI: Pt is an 81 y/o male c/o R elbow pain ongoing for about 1 week. Pt sits in favorite chair for hours every day, usually more or less in one position with the arms on the arms of the chair for hours. Pt was seen by PCP yesterday and advised to use a pillow under his arm. Pt was later seen at the [facility] ED c/o weakness and had his R elbow aspirated and due to also being febrile a systemic infection was suspected and pt was prescribed vancomycin. Today, pt locates pain to  R elbow swelling: Aggravates: Treatments tried:  Dx testing: 05/15/21 Labs/cultures  Pertinent review of Systems: ***  Relevant historical information: ***   Objective:   There were no vitals filed for this visit. General: Well Developed, well nourished, and in no acute distress.   MSK: ***  Lab and Radiology Results Results for orders placed or performed during the hospital encounter of 05/15/21 (from the past 72 hour(s))  Basic metabolic panel     Status: Abnormal   Collection Time: 05/15/21  5:47 PM  Result Value Ref Range   Sodium 139 135 - 145 mmol/L   Potassium 3.2 (L) 3.5 - 5.1 mmol/L   Chloride 101 98 - 111 mmol/L   CO2 27 22 - 32 mmol/L   Glucose, Bld 150 (H) 70 - 99 mg/dL    Comment: Glucose reference range applies only to samples taken after fasting for at least 8 hours.   BUN 65 (H) 8 - 23 mg/dL   Creatinine, Ser 3.55 (H) 0.61 - 1.24 mg/dL   Calcium 9.2 8.9 - 10.3 mg/dL   GFR, Estimated 17 (L) >60 mL/min    Comment: (NOTE) Calculated using the CKD-EPI Creatinine Equation (2021)    Anion gap 11 5 - 15    Comment: Performed at [facility] [address], [place], [place] [address]  CBC     Status: Abnormal   Collection Time: 05/15/21  5:47 PM  Result Value Ref Range   WBC 12.9 (H) 4.0 - 10.5 K/uL   RBC 3.67 (L) 4.22 - 5.81 MIL/uL   Hemoglobin 10.6 (L) 13.0 - 17.0 g/dL    HCT 31.7 (L) 39.0 - 52.0 %   MCV 86.4 80.0 - 100.0 fL   MCH 28.9 26.0 - 34.0 pg   MCHC 33.4 30.0 - 36.0 g/dL   RDW 13.3 11.5 - 15.5 %   Platelets 244 150 - 400 K/uL   nRBC 0.0 0.0 - 0.2 %    Comment: Performed at [facility], [place] [address] [place],  [address]  CBG monitoring, ED     Status: Abnormal   Collection Time: 05/15/21  8:46 PM  Result Value Ref Range   Glucose-Capillary 183 (H) 70 - 99 mg/dL    Comment: Glucose reference range applies only to samples taken after fasting for at least 8 hours.   Comment 1 Notify RN    Comment 2 Document in Chart   Resp Panel by RT-PCR (Flu A&B, Covid) Nasopharyngeal Swab     Status: None   Collection Time: 05/15/21  8:47 PM   Specimen: Nasopharyngeal Swab; Nasopharyngeal(NP) swabs in vial transport medium  Result Value Ref Range   SARS Coronavirus 2 by RT PCR NEGATIVE NEGATIVE    Comment: (NOTE) SARS-CoV-2 target nucleic acids are NOT DETECTED.  The SARS-CoV-2 RNA is generally detectable in upper respiratory specimens during the acute phase of infection. The lowest concentration of SARS-CoV-2 viral copies this assay can detect is 138 copies/mL. A negative result does not preclude SARS-Cov-2 infection and should not be used as the sole basis for treatment or other patient management decisions. A negative result may occur with  improper specimen collection/handling, submission of specimen other than nasopharyngeal swab, presence of viral mutation(s) within the areas targeted by this assay, and inadequate number of viral copies(<138 copies/mL). A negative result must be combined with clinical observations, patient history, and epidemiological information. The expected result is Negative.  Fact Sheet for Patients:  EntrepreneurPulse.com.au  Fact Sheet for Healthcare Providers:  IncredibleEmployment.be  This test is no t yet approved or cleared by the [place] FDA and  has been  authorized for detection and/or diagnosis of SARS-CoV-2 by FDA under an Emergency Use Authorization (EUA). This EUA will remain  in effect (meaning this test can be used) for the duration of the COVID-19 declaration under Section 564(b)(1) of the Act, 21 U.S.C.section 360bbb-3(b)(1), unless the authorization is terminated  or revoked sooner.       Influenza A by PCR NEGATIVE NEGATIVE   Influenza B by PCR NEGATIVE NEGATIVE    Comment: (NOTE) The Xpert Xpress SARS-CoV-2/FLU/RSV plus assay is intended as an aid in the diagnosis of influenza from Nasopharyngeal swab specimens and should not be used as a sole basis for treatment. Nasal washings and aspirates are unacceptable for Xpert Xpress SARS-CoV-2/FLU/RSV testing.  Fact Sheet for Patients: EntrepreneurPulse.com.au  Fact Sheet for Healthcare Providers: IncredibleEmployment.be  This test is not yet approved or cleared by the [place] FDA and has been authorized for detection and/or diagnosis of SARS-CoV-2 by FDA under an Emergency Use Authorization (EUA). This EUA will remain in effect (meaning this test can be used) for the duration of the COVID-19 declaration under Section 564(b)(1) of the Act, 21 U.S.C. section 360bbb-3(b)(1), unless the authorization is terminated or revoked.  Performed at [facility], [place] [address], [place], [place] [address]   Troponin I (High Sensitivity)     Status: Abnormal   Collection Time: 05/15/21  8:47 PM  Result Value Ref Range   Troponin I (High Sensitivity) 36 (H) <18 ng/L    Comment: (NOTE) Elevated high sensitivity troponin I (hsTnI) values and significant  changes across serial measurements may suggest ACS but many other  chronic and acute conditions are known to elevate hsTnI results.  Refer to the Links section for chest pain algorithms and additional  guidance. Performed at [facility], [place] [address], [place],  [place] [address]   CK     Status: None   Collection Time: 05/15/21 10:15 PM  Result Value Ref Range   Total CK 138 49 - 397 U/L    Comment: Performed at [facility], [place] [address] [place], [place] [address]  Troponin I (High Sensitivity)     Status: Abnormal   Collection Time: 05/15/21 10:15 PM  Result Value Ref Range   Troponin I (High Sensitivity) 37 (H) <18 ng/L    Comment: (NOTE) Elevated high sensitivity troponin I (hsTnI) values and significant  changes across serial measurements may suggest ACS but many other  chronic and acute conditions are known to elevate hsTnI results.  Refer to the Links section for chest pain algorithms and additional  guidance. Performed at [facility], [place] [address] [place], [place] [address]  Urinalysis, Routine w reflex microscopic     Status: None   Collection Time: 05/15/21 10:21 PM  Result Value Ref Range   Color, Urine YELLOW YELLOW   APPearance CLEAR CLEAR   Specific Gravity, Urine 1.010 1.005 - 1.030   pH 5.0 5.0 - 8.0   Glucose, UA NEGATIVE NEGATIVE mg/dL   Hgb urine dipstick NEGATIVE NEGATIVE   Bilirubin Urine NEGATIVE NEGATIVE   Ketones, ur NEGATIVE NEGATIVE mg/dL   Protein, ur NEGATIVE NEGATIVE mg/dL   Nitrite NEGATIVE NEGATIVE   Leukocytes,Ua NEGATIVE NEGATIVE    Comment: Performed at [facility] [address] [place], [place] [address]  Cell count + diff,  w/ cryst-synvl fld     Status: Abnormal   Collection Time: 05/15/21 11:43 PM  Result Value Ref Range   Color, Synovial AMBER (A) YELLOW   Appearance-Synovial CLOUDY (A) CLEAR   Crystals, Fluid INTRACELLULAR MONOSODIUM URATE CRYSTALS    WBC, Synovial 3,900 (H) 0 - 200 /cu mm   Neutrophil, Synovial 81 (H) 0 - 25 %   Lymphocytes-Synovial Fld 0 0 - 20 %   Monocyte-Macrophage-Synovial Fluid 19 (L) 50 - 90 %   Eosinophils-Synovial 0 0 - 1 %    Comment: Performed at [facility] [address], [place], [place] [address]  Glucose, Body Fluid Other      Status: None   Collection Time: 05/15/21 11:43 PM  Result Value Ref Range   Glucose, Body Fluid Other 79    Source of Sample BURSA/SYNOVIAL CYST     Comment: Performed at [facility] [address], [place], [place] [address]  Body fluid culture w Gram Stain     Status: None (Preliminary result)   Collection Time: 05/15/21 11:45 PM   Specimen: Body Fluid  Result Value Ref Range   Specimen Description FLUID SYNOVIAL    Special Requests BURSA CYST    Gram Stain      FEW WBC PRESENT,BOTH PMN AND MONONUCLEAR NO ORGANISMS SEEN Performed at [facility], [address] [place], [place] [address]    Culture PENDING    Report Status PENDING   CBG monitoring, ED     Status: Abnormal   Collection Time: 05/16/21 12:11 AM  Result Value Ref Range   Glucose-Capillary 126 (H) 70 - 99 mg/dL    Comment: Glucose reference range applies only to samples taken after fasting for at least 8 hours.   DG Chest 2 View  Result Date: 05/15/2021 CLINICAL DATA:  Fever. EXAM: CHEST - 2 VIEW COMPARISON:  Chest x-ray 11/29/2020. FINDINGS: Sternotomy wires are present. The aorta is tortuous. Cardiac silhouette is within normal limits. Lungs are clear. No pleural effusion or pneumothorax. No acute fractures. IMPRESSION: 1. No acute cardiopulmonary process. Electronically Signed   By: Ronney Asters M.D.   On: 05/15/2021 18:04      Impression and Recommendations:    Assessment and Plan: 81 y.o. male with ***.  PDMP not reviewed this encounter. No orders of the defined types were placed in this encounter.  No orders of the defined types were placed in this encounter.   Discussed warning signs or symptoms. Please see discharge instructions. Patient expresses understanding.   ***

## 2021-05-16 NOTE — Assessment & Plan Note (Addendum)
Euvolemic-resume torsemide on discharge.

## 2021-05-16 NOTE — Assessment & Plan Note (Addendum)
Has progressive worsening renal function over the past 1 year-apparently is taking double dosing of torsemide that he is supposed to be on.  Resume torsemide 50 mg on discharge (he apparently was taken 100 mg prior).  Continue to hold ARB.  Follow with nephrology/PCP.

## 2021-05-16 NOTE — Assessment & Plan Note (Addendum)
No anginal symptoms-continue aspirin, Coreg and statin.  Troponins minimally elevated and of no clinical significance.

## 2021-05-16 NOTE — Hospital Course (Addendum)
Patient is a 81 y.o.  male history of DM-2, HTN,Gout, COPD, CAD s/p CABG, IgG kappa monoclonal paraproteinemia-who presented with weakness, AKI on CKD stage IV and right olecranon bursitis.  Significant Hospital events: 2/06>> admit to [facility] for AKI/right olecranon bursitis.  Significant imaging studies: 2/06>> CXR: No pneumonia 2/08>> RUE Doppler: No DVT  Significant microbiology data: 2/06>> COVID/influenza PCR: Negative 2/06>> blood culture: negative 2/06>> synovial fluid culture: negative (WBC 3900, intracellular monosodium urate crystals)  Procedures: 2/07>>Arthrocentesis right elbow-by ED  Consults:  None

## 2021-05-16 NOTE — Assessment & Plan Note (Addendum)
Due to acute illness-advanced age/frailty-appreciate PT/OT eval-will arrange for home health services prior to discharge.

## 2021-05-16 NOTE — ED Notes (Signed)
ED TO INPATIENT HANDOFF REPORT  ED Nurse Name and Phone #:   S Name/Age/Gender Harold Pittman 81 y.o. male Room/Bed: 046C/046C  Code Status   Code Status: Full Code  Home/SNF/Other Home Patient oriented to: self, place, time, and situation Is this baseline? Yes   Triage Complete: Triage complete  Chief Complaint Sepsis [facility]) [A41.9] Septic bursitis [M71.10]  Triage Note Pt arrives POV for eval of generalized weakness,and R arm pain. Pt reports onset of weakness on Saturday, reports arm pain x weeks. States he feels poorly, vague complaint. Febrile in triage, no sick contacts.    Allergies Allergies  Allergen Reactions   Benazepril Shortness Of Breath    Cough, wheezing    Level of Care/Admitting Diagnosis ED Disposition     ED Disposition  Admit   Condition  --   Comment  Hospital Area: [facility] [[id number]]  Level of Care: Med-Surg [16]  May admit patient to [facility] or [facility] if equivalent level of care is available:: Yes  Covid Evaluation: Asymptomatic Screening Protocol (No Symptoms)  Diagnosis: Septic bursitis [629476]  Admitting Physician: Shela Leff [[id number]]  Attending Physician: Shela Leff [[id number]]  Estimated length of stay: past midnight tomorrow  Certification:: I certify this patient will need inpatient services for at least 2 midnights          B Medical/Surgery History Past Medical History:  Diagnosis Date   Anemia    low iron   Anxiety state, unspecified    Blood in stool    Chronic airway obstruction, not elsewhere classified    Coronary artery disease    Coronary atherosclerosis of artery bypass graft    Dyspnea    ED (erectile dysfunction)    Esophageal reflux    Gout, unspecified    Osteoarthrosis, unspecified whether generalized or localized, unspecified site    Other and unspecified hyperlipidemia    Other specified disorder of rectum and anus    Personal history of tobacco use,  presenting hazards to health    Sleep apnea    does not use cpap   Type II or unspecified type diabetes mellitus without mention of complication, not stated as uncontrolled    Unspecified essential hypertension    Past Surgical History:  Procedure Laterality Date   BIOPSY  09/26/2017   Procedure: BIOPSY;  Surgeon: Ladene Artist, MD;  Location: [place];  Service: Endoscopy;;   BIOPSY  02/03/2018   Procedure: BIOPSY;  Surgeon: Irving Copas., MD;  Location: [facility] ENDOSCOPY;  Service: Gastroenterology;;   COLONOSCOPY WITH PROPOFOL N/A 09/26/2017   Procedure: COLONOSCOPY WITH PROPOFOL;  Surgeon: Ladene Artist, MD;  Location: [facility] ENDOSCOPY;  Service: Endoscopy;  Laterality: N/A;   CORONARY ARTERY BYPASS GRAFT  40   LIMA to LAD, sequential saphenous vein graft to the first and second diagonal, sequential saphenous vein graft to the intermediate OM and circumflex and SVG to RCA   ESOPHAGOGASTRODUODENOSCOPY (EGD) WITH PROPOFOL N/A 09/26/2017   Procedure: ESOPHAGOGASTRODUODENOSCOPY (EGD) WITH PROPOFOL;  Surgeon: Ladene Artist, MD;  Location: [facility] ENDOSCOPY;  Service: Endoscopy;  Laterality: N/A;   ESOPHAGOGASTRODUODENOSCOPY (EGD) WITH PROPOFOL N/A 02/03/2018   Procedure: ESOPHAGOGASTRODUODENOSCOPY (EGD);  Surgeon: Irving Copas., MD;  Location: [place];  Service: Gastroenterology;  Laterality: N/A;   KNEE SURGERY     BILATERAL   POLYPECTOMY  09/26/2017   Procedure: POLYPECTOMY;  Surgeon: Ladene Artist, MD;  Location: [facility] ENDOSCOPY;  Service: Endoscopy;;   ROTATOR CUFF REPAIR  SUBMUCOSAL INJECTION  02/03/2018   Procedure: SUBMUCOSAL INJECTION;  Surgeon: Irving Copas., MD;  Location: [place];  Service: Gastroenterology;;     A IV Location/Drains/Wounds Patient Lines/Drains/Airways Status     Active Line/Drains/Airways     Name Placement date Placement time Site Days   Peripheral IV 05/15/21 20 G Anterior;Distal;Left;Upper Arm 05/15/21  2051  Arm   1            Intake/Output Last 24 hours  Intake/Output Summary (Last 24 hours) at 05/16/2021 2330 Last data filed at 05/16/2021 0645 Gross per 24 hour  Intake 1000 ml  Output --  Net 1000 ml    Labs/Imaging Results for orders placed or performed during the hospital encounter of 05/15/21 (from the past 48 hour(s))  Basic metabolic panel     Status: Abnormal   Collection Time: 05/15/21  5:47 PM  Result Value Ref Range   Sodium 139 135 - 145 mmol/L   Potassium 3.2 (L) 3.5 - 5.1 mmol/L   Chloride 101 98 - 111 mmol/L   CO2 27 22 - 32 mmol/L   Glucose, Bld 150 (H) 70 - 99 mg/dL    Comment: Glucose reference range applies only to samples taken after fasting for at least 8 hours.   BUN 65 (H) 8 - 23 mg/dL   Creatinine, Ser 3.55 (H) 0.61 - 1.24 mg/dL   Calcium 9.2 8.9 - 10.3 mg/dL   GFR, Estimated 17 (L) >60 mL/min    Comment: (NOTE) Calculated using the CKD-EPI Creatinine Equation (2021)    Anion gap 11 5 - 15    Comment: Performed at [facility] [address] [place], [place] [address]  CBC     Status: Abnormal   Collection Time: 05/15/21  5:47 PM  Result Value Ref Range   WBC 12.9 (H) 4.0 - 10.5 K/uL   RBC 3.67 (L) 4.22 - 5.81 MIL/uL   Hemoglobin 10.6 (L) 13.0 - 17.0 g/dL   HCT 31.7 (L) 39.0 - 52.0 %   MCV 86.4 80.0 - 100.0 fL   MCH 28.9 26.0 - 34.0 pg   MCHC 33.4 30.0 - 36.0 g/dL   RDW 13.3 11.5 - 15.5 %   Platelets 244 150 - 400 K/uL   nRBC 0.0 0.0 - 0.2 %    Comment: Performed at [facility], [place] [address], [place], [place] [address]  CBG monitoring, ED     Status: Abnormal   Collection Time: 05/15/21  8:46 PM  Result Value Ref Range   Glucose-Capillary 183 (H) 70 - 99 mg/dL    Comment: Glucose reference range applies only to samples taken after fasting for at least 8 hours.   Comment 1 Notify RN    Comment 2 Document in Chart   Resp Panel by RT-PCR (Flu A&B, Covid) Nasopharyngeal Swab     Status: None   Collection Time: 05/15/21  8:47 PM    Specimen: Nasopharyngeal Swab; Nasopharyngeal(NP) swabs in vial transport medium  Result Value Ref Range   SARS Coronavirus 2 by RT PCR NEGATIVE NEGATIVE    Comment: (NOTE) SARS-CoV-2 target nucleic acids are NOT DETECTED.  The SARS-CoV-2 RNA is generally detectable in upper respiratory specimens during the acute phase of infection. The lowest concentration of SARS-CoV-2 viral copies this assay can detect is 138 copies/mL. A negative result does not preclude SARS-Cov-2 infection and should not be used as the sole basis for treatment or other patient management decisions. A negative result may  occur with  improper specimen collection/handling, submission of specimen other than nasopharyngeal swab, presence of viral mutation(s) within the areas targeted by this assay, and inadequate number of viral copies(<138 copies/mL). A negative result must be combined with clinical observations, patient history, and epidemiological information. The expected result is Negative.  Fact Sheet for Patients:  EntrepreneurPulse.com.au  Fact Sheet for Healthcare Providers:  IncredibleEmployment.be  This test is no t yet approved or cleared by the [place] FDA and  has been authorized for detection and/or diagnosis of SARS-CoV-2 by FDA under an Emergency Use Authorization (EUA). This EUA will remain  in effect (meaning this test can be used) for the duration of the COVID-19 declaration under Section 564(b)(1) of the Act, 21 U.S.C.section 360bbb-3(b)(1), unless the authorization is terminated  or revoked sooner.       Influenza A by PCR NEGATIVE NEGATIVE   Influenza B by PCR NEGATIVE NEGATIVE    Comment: (NOTE) The Xpert Xpress SARS-CoV-2/FLU/RSV plus assay is intended as an aid in the diagnosis of influenza from Nasopharyngeal swab specimens and should not be used as a sole basis for treatment. Nasal washings and aspirates are unacceptable for Xpert Xpress  SARS-CoV-2/FLU/RSV testing.  Fact Sheet for Patients: EntrepreneurPulse.com.au  Fact Sheet for Healthcare Providers: IncredibleEmployment.be  This test is not yet approved or cleared by the [place] FDA and has been authorized for detection and/or diagnosis of SARS-CoV-2 by FDA under an Emergency Use Authorization (EUA). This EUA will remain in effect (meaning this test can be used) for the duration of the COVID-19 declaration under Section 564(b)(1) of the Act, 21 U.S.C. section 360bbb-3(b)(1), unless the authorization is terminated or revoked.  Performed at [facility], [place] [address] [place], [place] [address]   Troponin I (High Sensitivity)     Status: Abnormal   Collection Time: 05/15/21  8:47 PM  Result Value Ref Range   Troponin I (High Sensitivity) 36 (H) <18 ng/L    Comment: (NOTE) Elevated high sensitivity troponin I (hsTnI) values and significant  changes across serial measurements may suggest ACS but many other  chronic and acute conditions are known to elevate hsTnI results.  Refer to the Links section for chest pain algorithms and additional  guidance. Performed at [facility], [place] [address] [place], [place] [address]   CK     Status: None   Collection Time: 05/15/21 10:15 PM  Result Value Ref Range   Total CK 138 49 - 397 U/L    Comment: Performed at [facility], [place] [address] [place], [place] [address]  Troponin I (High Sensitivity)     Status: Abnormal   Collection Time: 05/15/21 10:15 PM  Result Value Ref Range   Troponin I (High Sensitivity) 37 (H) <18 ng/L    Comment: (NOTE) Elevated high sensitivity troponin I (hsTnI) values and significant  changes across serial measurements may suggest ACS but many other  chronic and acute conditions are known to elevate hsTnI results.  Refer to the Links section for chest pain algorithms and additional  guidance. Performed at [facility], [place] [address], [place], [place] [address]   Urinalysis, Routine w reflex microscopic     Status: None   Collection Time: 05/15/21 10:21 PM  Result Value Ref Range   Color, Urine YELLOW YELLOW   APPearance CLEAR CLEAR   Specific Gravity, Urine 1.010 1.005 - 1.030   pH 5.0 5.0 - 8.0   Glucose, UA NEGATIVE NEGATIVE  mg/dL   Hgb urine dipstick NEGATIVE NEGATIVE   Bilirubin Urine NEGATIVE NEGATIVE   Ketones, ur NEGATIVE NEGATIVE mg/dL   Protein, ur NEGATIVE NEGATIVE mg/dL   Nitrite NEGATIVE NEGATIVE   Leukocytes,Ua NEGATIVE NEGATIVE    Comment: Performed at [facility] [address] [place], [place] [address]  Cell count + diff,  w/ cryst-synvl fld     Status: Abnormal   Collection Time: 05/15/21 11:43 PM  Result Value Ref Range   Color, Synovial AMBER (A) YELLOW   Appearance-Synovial CLOUDY (A) CLEAR   Crystals, Fluid INTRACELLULAR MONOSODIUM URATE CRYSTALS    WBC, Synovial 3,900 (H) 0 - 200 /cu mm   Neutrophil, Synovial 81 (H) 0 - 25 %   Lymphocytes-Synovial Fld 0 0 - 20 %   Monocyte-Macrophage-Synovial Fluid 19 (L) 50 - 90 %   Eosinophils-Synovial 0 0 - 1 %    Comment: Performed at [facility] [address], [place], [place] [address]  Glucose, Body Fluid Other     Status: None   Collection Time: 05/15/21 11:43 PM  Result Value Ref Range   Glucose, Body Fluid Other 79    Source of Sample BURSA/SYNOVIAL CYST     Comment: Performed at [facility] [address] [place], [place] [address]  Body fluid culture w Gram Stain     Status: None (Preliminary result)   Collection Time: 05/15/21 11:45 PM   Specimen: Body Fluid  Result Value Ref Range   Specimen Description FLUID SYNOVIAL    Special Requests BURSA CYST    Gram Stain      FEW WBC PRESENT,BOTH PMN AND MONONUCLEAR NO ORGANISMS SEEN Performed at [facility], [address] [place] [address] [place], [place] [address]    Culture PENDING    Report Status PENDING   CBG monitoring, ED     Status: Abnormal    Collection Time: 05/16/21 12:11 AM  Result Value Ref Range   Glucose-Capillary 126 (H) 70 - 99 mg/dL    Comment: Glucose reference range applies only to samples taken after fasting for at least 8 hours.  Magnesium     Status: None   Collection Time: 05/16/21  7:39 AM  Result Value Ref Range   Magnesium 2.0 1.7 - 2.4 mg/dL    Comment: Performed at [facility], [place] [address], [place], [place] [address]  CBC     Status: Abnormal   Collection Time: 05/16/21  7:39 AM  Result Value Ref Range   WBC 10.2 4.0 - 10.5 K/uL   RBC 3.54 (L) 4.22 - 5.81 MIL/uL   Hemoglobin 10.1 (L) 13.0 - 17.0 g/dL   HCT 30.7 (L) 39.0 - 52.0 %   MCV 86.7 80.0 - 100.0 fL   MCH 28.5 26.0 - 34.0 pg   MCHC 32.9 30.0 - 36.0 g/dL   RDW 13.2 11.5 - 15.5 %   Platelets 208 150 - 400 K/uL   nRBC 0.0 0.0 - 0.2 %    Comment: Performed at [facility], [place] [address], [place], [place] [address]  Basic metabolic panel     Status: Abnormal   Collection Time: 05/16/21  7:39 AM  Result Value Ref Range   Sodium 141 135 - 145 mmol/L   Potassium 3.4 (L) 3.5 - 5.1 mmol/L   Chloride 103 98 - 111 mmol/L   CO2 27 22 - 32 mmol/L   Glucose, Bld 146 (H) 70 - 99 mg/dL  Comment: Glucose reference range applies only to samples taken after fasting for at least 8 hours.   BUN 62 (H) 8 - 23 mg/dL   Creatinine, Ser 3.33 (H) 0.61 - 1.24 mg/dL   Calcium 9.2 8.9 - 10.3 mg/dL   GFR, Estimated 18 (L) >60 mL/min    Comment: (NOTE) Calculated using the CKD-EPI Creatinine Equation (2021)    Anion gap 11 5 - 15    Comment: Performed at [facility] [address] [place], [place] [address]  CBG monitoring, ED     Status: Abnormal   Collection Time: 05/16/21  8:02 AM  Result Value Ref Range   Glucose-Capillary 143 (H) 70 - 99 mg/dL    Comment: Glucose reference range applies only to samples taken after fasting for at least 8 hours.   Comment 1 Notify RN    Comment 2 Document in Chart   CBG monitoring, ED     Status:  Abnormal   Collection Time: 05/16/21 12:23 PM  Result Value Ref Range   Glucose-Capillary 146 (H) 70 - 99 mg/dL    Comment: Glucose reference range applies only to samples taken after fasting for at least 8 hours.   Comment 1 Notify RN    Comment 2 Document in Chart   CBG monitoring, ED     Status: Abnormal   Collection Time: 05/16/21  9:06 PM  Result Value Ref Range   Glucose-Capillary 160 (H) 70 - 99 mg/dL    Comment: Glucose reference range applies only to samples taken after fasting for at least 8 hours.   DG Chest 2 View  Result Date: 05/15/2021 CLINICAL DATA:  Fever. EXAM: CHEST - 2 VIEW COMPARISON:  Chest x-ray 11/29/2020. FINDINGS: Sternotomy wires are present. The aorta is tortuous. Cardiac silhouette is within normal limits. Lungs are clear. No pleural effusion or pneumothorax. No acute fractures. IMPRESSION: 1. No acute cardiopulmonary process. Electronically Signed   By: Ronney Asters M.D.   On: 05/15/2021 18:04    Pending Labs Unresulted Labs (From admission, onward)     Start     Ordered   05/17/21 0500  CBC  Tomorrow morning,   R        05/16/21 0718   05/17/21 6213  Basic metabolic panel  Tomorrow morning,   R        05/16/21 0718   05/17/21 0500  Magnesium  Tomorrow morning,   R       Comments: Call MD if Magnesium <1.5    05/16/21 1006   05/15/21 2344  Blood culture (routine x 2)  BLOOD CULTURE X 2,   STAT      05/15/21 2343   05/15/21 2053  Urine Culture  Once,   STAT       Question:  Indication  Answer:  Altered mental status (if no other cause identified)   05/15/21 2052            Vitals/Pain Today's Vitals   05/16/21 1800 05/16/21 2115 05/16/21 2120 05/16/21 2300  BP: 137/64 (!) 159/73  137/65  Pulse: 71 (!) 148 78 75  Resp: (!) 22     Temp:      TempSrc:      SpO2: 96% 95% 97% 95%  Weight:      Height:      PainSc:        Isolation Precautions No active isolations  Medications Medications  aspirin EC tablet 81 mg (81 mg Oral Given  05/16/21  1035)  amLODipine (NORVASC) tablet 10 mg (10 mg Oral Given 05/16/21 1036)  carvedilol (COREG) tablet 25 mg (25 mg Oral Given 05/16/21 2118)  hydrALAZINE (APRESOLINE) tablet 50 mg (50 mg Oral Not Given 05/16/21 2119)  pravastatin (PRAVACHOL) tablet 80 mg (80 mg Oral Given 05/16/21 2117)  pantoprazole (PROTONIX) EC tablet 40 mg (40 mg Oral Given 05/16/21 1035)  gabapentin (NEURONTIN) capsule 300 mg (300 mg Oral Not Given 05/16/21 2120)  acetaminophen (TYLENOL) tablet 650 mg (has no administration in time range)    Or  acetaminophen (TYLENOL) suppository 650 mg (has no administration in time range)  insulin aspart (novoLOG) injection 0-9 Units (0 Units Subcutaneous Not Given 05/16/21 2120)  insulin aspart (novoLOG) injection 0-5 Units (2 Units Subcutaneous Given 05/16/21 2119)  vancomycin (VANCOCIN) IVPB 1000 mg/200 mL premix (has no administration in time range)  heparin injection 5,000 Units (5,000 Units Subcutaneous Not Given 05/16/21 2311)  predniSONE (DELTASONE) tablet 40 mg (has no administration in time range)  0.9 %  sodium chloride infusion ( Intravenous New Bag/Given 05/16/21 1043)  acetaminophen (TYLENOL) tablet 650 mg (650 mg Oral Given 05/15/21 1741)  potassium chloride SA (KLOR-CON M) CR tablet 20 mEq (20 mEq Oral Given 05/15/21 2212)  lidocaine (PF) (XYLOCAINE) 1 % injection 5 mL (5 mLs Intradermal Given by Other 05/15/21 2335)  lactated ringers bolus 1,000 mL (0 mLs Intravenous Stopped 05/16/21 0645)  vancomycin (VANCOREADY) IVPB 1500 mg/300 mL (0 mg Intravenous Stopped 05/16/21 0645)  potassium chloride SA (KLOR-CON M) CR tablet 40 mEq (40 mEq Oral Given 05/16/21 1036)    Mobility walks with device     Focused Assessments    R Recommendations: See Admitting Provider Note  Report given to:   Additional Notes:

## 2021-05-16 NOTE — Progress Notes (Signed)
PROGRESS NOTE        PATIENT DETAILS Name: Harold Pittman Age: 81 y.o. Sex: male Date of Birth: [birth year] Admit Date: 05/15/2021 Admitting Physician Shela Leff, MD [id number], Evie Lacks, MD  Brief Summary: Patient is a 81 y.o.  male history of DM-2, HTN, COPD, CAD s/p CABG, IgG kappa monoclonal paraproteinemia-who presented with weakness, AKI on CKD stage IV and right olecranon bursitis.  Significant Hospital events: 2/06>> admit to [facility] for AKI/right olecranon bursitis.  Significant imaging studies: 2/06>> CXR: No pneumonia  Significant microbiology data: 2/06>> COVID/influenza PCR: Negative 2/06>> blood culture: Pending 2/06>> synovial fluid culture: Pending (WBC 3900, intracellular monosodium urate crystals)  Procedures: 2/07>>Arthrocentesis right elbow-by ED  Consults:  None  Subjective: Right elbow still swollen but better.  Objective: Vitals: Blood pressure (!) 142/67, pulse 63, temperature 99.9 F (37.7 C), temperature source Rectal, resp. rate 19, height 6\' 1"  (1.854 m), weight 80 kg, SpO2 98 %.   Exam: Gen Exam:Alert awake-not in any distress HEENT:atraumatic, normocephalic Chest: B/L clear to auscultation anteriorly CVS:S1S2 regular Abdomen:soft non tender, non distended Extremities:no edema-mildly tender right elbow-+ swelling Neurology: Non focal Skin: no rash  Pertinent Labs/Radiology: CBC Latest Ref Rng & Units 05/16/2021 05/15/2021 03/13/2021  WBC 4.0 - 10.5 K/uL 10.2 12.9(H) 4.9  Hemoglobin 13.0 - 17.0 g/dL 10.1(L) 10.6(L) 10.4(L)  Hematocrit 39.0 - 52.0 % 30.7(L) 31.7(L) 30.8(L)  Platelets 150 - 400 K/uL 208 244 202    Lab Results  Component Value Date   NA 141 05/16/2021   K 3.4 (L) 05/16/2021   CL 103 05/16/2021   CO2 27 05/16/2021      Assessment/Plan: Olecranon bursitis, right elbow- (present on admission) Febrile on initial presentation with mild leukocytosis, concern for septic arthritis however  sinal for analysis most consistent with acute gout.  For now continue with IV vancomycin until cultures are negative, discussed with orthopedics-we will provide a short course of systemic steroids.  Follow clinical course.  CKD (chronic kidney disease) stage 4, GFR 15-29 ml/min (HCC)- (present on admission) Has progressive worsening renal function over the past 1 year-apparently is taking double dosing of torsemide that he is supposed to be on.  Hold torsemide/Avapro-gently hydrate-and see how he does.  Follow electrolytes.  Generalized weakness Due to acute illness-advanced age/frailty-await PT/OT eval.  Gout- (present on admission) Per patient-he has a known history of gout-review of medications reveals that he is only on colchicine as needed.  Avoid colchicine for now given CKD stage IV.  See above regarding plans to start steroids.  Chronic diastolic CHF (congestive heart failure) ([place])- (present on admission) Euvolemic-hold furosemide for today.  Reassess tomorrow.  Essential hypertension- (present on admission) BP stable-Continue amlodipine, Coreg, hydralazine-hold, irbesartan and torsemide  Type 2 diabetes mellitus (A1c 6.0 on 05/03/2021) Diet controlled at home.  CBG stable with SSI-watch closely as patient will be started on steroids.  Recent Labs    05/15/21 2046 05/16/21 0011 05/16/21 0802  GLUCAP 183* 126* 143*     CAD (coronary artery disease)- (present on admission) No anginal symptoms-continue aspirin, Coreg and statin.  Troponins minimally elevated and of no clinical significance.   Hypokalemia- (present on admission) Replete and recheck.   BMI: Estimated body mass index is 23.27 kg/m as calculated from the following:   Height as of this encounter: 6\' 1"  (1.854 m).   Weight as of  this encounter: 80 kg.   Code status:   Code Status: Full Code   DVT Prophylaxis: heparin injection 5,000 Units Start: 05/16/21 0815   Family Communication: Daughter [name]  [phone]-updated over the phone on 2/7   Disposition Plan: Status is: Inpatient Remains inpatient appropriate because: Gout flare causing bursitis-ruling out infectious etiology-remains on IV vancomycin till cultures come back.  Worsening renal function and IVF-not yet stable for discharge.  Needs PT/OT eval to determine appropriate disposition.   Planned Discharge Destination:Home health   Diet: Diet Order             Diet heart healthy/carb modified Room service appropriate? Yes; Fluid consistency: Thin  Diet effective now                     Antimicrobial agents: Anti-infectives (From admission, onward)    Start     Dose/Rate Route Frequency Ordered Stop   05/19/21 0200  vancomycin (VANCOCIN) IVPB 1000 mg/200 mL premix        1,000 mg 200 mL/hr over 60 Minutes Intravenous Every 48 hours 05/16/21 0729     05/16/21 0015  vancomycin (VANCOREADY) IVPB 1500 mg/300 mL        1,500 mg 150 mL/hr over 120 Minutes Intravenous  Once 05/16/21 0001 05/16/21 0645        MEDICATIONS: Scheduled Meds:  amLODipine  10 mg Oral Daily   aspirin EC  81 mg Oral Daily   carvedilol  25 mg Oral BID WC   gabapentin  300 mg Oral TID   heparin injection (subcutaneous)  5,000 Units Subcutaneous Q8H   hydrALAZINE  50 mg Oral TID   insulin aspart  0-5 Units Subcutaneous QHS   insulin aspart  0-9 Units Subcutaneous TID WC   pantoprazole  40 mg Oral Daily   potassium chloride  40 mEq Oral Once   pravastatin  80 mg Oral QHS   [START ON 05/17/2021] predniSONE  40 mg Oral Q breakfast   Continuous Infusions:  sodium chloride     [START ON 05/19/2021] vancomycin     PRN Meds:.acetaminophen **OR** acetaminophen   I have personally reviewed following labs and imaging studies  LABORATORY DATA: CBC: Recent Labs  Lab 05/15/21 1747 05/16/21 0739  WBC 12.9* 10.2  HGB 10.6* 10.1*  HCT 31.7* 30.7*  MCV 86.4 86.7  PLT 244 440    Basic Metabolic Panel: Recent Labs  Lab  05/15/21 1747 05/16/21 0739  NA 139 141  K 3.2* 3.4*  CL 101 103  CO2 27 27  GLUCOSE 150* 146*  BUN 65* 62*  CREATININE 3.55* 3.33*  CALCIUM 9.2 9.2  MG  --  2.0    GFR: Estimated Creatinine Clearance: 20 mL/min (A) (by C-G formula based on SCr of 3.33 mg/dL (H)).  Liver Function Tests: No results for input(s): AST, ALT, ALKPHOS, BILITOT, PROT, ALBUMIN in the last 168 hours. No results for input(s): LIPASE, AMYLASE in the last 168 hours. No results for input(s): AMMONIA in the last 168 hours.  Coagulation Profile: No results for input(s): INR, PROTIME in the last 168 hours.  Cardiac Enzymes: Recent Labs  Lab 05/15/21 2215  CKTOTAL 138    BNP (last 3 results) No results for input(s): PROBNP in the last 8760 hours.  Lipid Profile: No results for input(s): CHOL, HDL, LDLCALC, TRIG, CHOLHDL, LDLDIRECT in the last 72 hours.  Thyroid Function Tests: No results for input(s): TSH, T4TOTAL, FREET4, T3FREE, THYROIDAB in the last 72 hours.  Anemia Panel: No results for input(s): VITAMINB12, FOLATE, FERRITIN, TIBC, IRON, RETICCTPCT in the last 72 hours.  Urine analysis:    Component Value Date/Time   COLORURINE YELLOW 05/15/2021 2221   APPEARANCEUR CLEAR 05/15/2021 2221   LABSPEC 1.010 05/15/2021 2221   PHURINE 5.0 05/15/2021 2221   GLUCOSEU NEGATIVE 05/15/2021 2221   GLUCOSEU 100 (A) 10/20/2018 1624   HGBUR NEGATIVE 05/15/2021 2221   BILIRUBINUR NEGATIVE 05/15/2021 2221   KETONESUR NEGATIVE 05/15/2021 2221   PROTEINUR NEGATIVE 05/15/2021 2221   UROBILINOGEN 0.2 10/20/2018 1624   NITRITE NEGATIVE 05/15/2021 2221   LEUKOCYTESUR NEGATIVE 05/15/2021 2221    Sepsis Labs: Lactic Acid, Venous    Component Value Date/Time   LATICACIDVEN 1.2 07/23/2015 0950    MICROBIOLOGY: Recent Results (from the past 240 hour(s))  Resp Panel by RT-PCR (Flu A&B, Covid) Nasopharyngeal Swab     Status: None   Collection Time: 05/15/21  8:47 PM   Specimen: Nasopharyngeal Swab;  Nasopharyngeal(NP) swabs in vial transport medium  Result Value Ref Range Status   SARS Coronavirus 2 by RT PCR NEGATIVE NEGATIVE Final    Comment: (NOTE) SARS-CoV-2 target nucleic acids are NOT DETECTED.  The SARS-CoV-2 RNA is generally detectable in upper respiratory specimens during the acute phase of infection. The lowest concentration of SARS-CoV-2 viral copies this assay can detect is 138 copies/mL. A negative result does not preclude SARS-Cov-2 infection and should not be used as the sole basis for treatment or other patient management decisions. A negative result may occur with  improper specimen collection/handling, submission of specimen other than nasopharyngeal swab, presence of viral mutation(s) within the areas targeted by this assay, and inadequate number of viral copies(<138 copies/mL). A negative result must be combined with clinical observations, patient history, and epidemiological information. The expected result is Negative.  Fact Sheet for Patients:  EntrepreneurPulse.com.au  Fact Sheet for Healthcare Providers:  IncredibleEmployment.be  This test is no t yet approved or cleared by the [place] FDA and  has been authorized for detection and/or diagnosis of SARS-CoV-2 by FDA under an Emergency Use Authorization (EUA). This EUA will remain  in effect (meaning this test can be used) for the duration of the COVID-19 declaration under Section 564(b)(1) of the Act, 21 U.S.C.section 360bbb-3(b)(1), unless the authorization is terminated  or revoked sooner.       Influenza A by PCR NEGATIVE NEGATIVE Final   Influenza B by PCR NEGATIVE NEGATIVE Final    Comment: (NOTE) The Xpert Xpress SARS-CoV-2/FLU/RSV plus assay is intended as an aid in the diagnosis of influenza from Nasopharyngeal swab specimens and should not be used as a sole basis for treatment. Nasal washings and aspirates are unacceptable for Xpert Xpress  SARS-CoV-2/FLU/RSV testing.  Fact Sheet for Patients: EntrepreneurPulse.com.au  Fact Sheet for Healthcare Providers: IncredibleEmployment.be  This test is not yet approved or cleared by the [place] FDA and has been authorized for detection and/or diagnosis of SARS-CoV-2 by FDA under an Emergency Use Authorization (EUA). This EUA will remain in effect (meaning this test can be used) for the duration of the COVID-19 declaration under Section 564(b)(1) of the Act, 21 U.S.C. section 360bbb-3(b)(1), unless the authorization is terminated or revoked.  Performed at [facility], [place] [address], [place], [place] [address]   Body fluid culture w Gram Stain     Status: None (Preliminary result)   Collection Time: 05/15/21 11:45 PM   Specimen: Body Fluid  Result Value Ref Range Status   Specimen Description  FLUID SYNOVIAL  Final   Special Requests BURSA CYST  Final   Gram Stain   Final    FEW WBC PRESENT,BOTH PMN AND MONONUCLEAR NO ORGANISMS SEEN Performed at [facility], [address], [place], [place] [address]    Culture PENDING  Incomplete   Report Status PENDING  Incomplete    RADIOLOGY STUDIES/RESULTS: DG Chest 2 View  Result Date: 05/15/2021 CLINICAL DATA:  Fever. EXAM: CHEST - 2 VIEW COMPARISON:  Chest x-ray 11/29/2020. FINDINGS: Sternotomy wires are present. The aorta is tortuous. Cardiac silhouette is within normal limits. Lungs are clear. No pleural effusion or pneumothorax. No acute fractures. IMPRESSION: 1. No acute cardiopulmonary process. Electronically Signed   By: Ronney Asters M.D.   On: 05/15/2021 18:04     LOS: 0 days   Oren Binet, MD  [facility]    To contact the attending provider between 7A-7P or the covering provider during after hours 7P-7A, please log into the web site [email or web address] and access using universal [place] password for that web site. If you do not have the password, please  call the hospital operator.  05/16/2021, 10:11 AM

## 2021-05-16 NOTE — H&P (Signed)
History and Physical    Harold Pittman:[id number] DOB: 1940/04/25 DOA: 05/15/2021  PCP: Cassandria Anger, MD  Patient coming from: Home  HPI: Harold Pittman is a 81 y.o. male with medical history significant of COPD, CAD status post CABG, hypertension, type 2 diabetes, CKD stage IV, anemia, chronic diastolic CHF, IgG kappa monoclonal paraproteinemia presented to the ED complaining of generalized weakness and difficulty ambulating.  Physical exam concerning for right elbow septic bursitis and underwent aspiration.  Patient febrile. Started on vancomycin.  Labs showing WBC 12.9.  Hemoglobin 10.6, stable.  Potassium 3.2.  BUN 65, creatinine 3.5.  COVID and flu negative.  Troponin 36> 37.  CK normal.  UA with negative nitrite and negative leukocytes, microscopy not done.  Urine culture pending.  Blood cultures pending.  Chest x-ray not suggestive of pneumonia.  Patient states he came to the emergency room as his right elbow was painful and swollen and also he was feeling generally weak and having difficulty walking.  Denies pain in any other joints.  States he tends to lean a lot on this elbow when sitting.  He had a fever when he came into the emergency room.  No other complaints.  Denies cough, shortness of breath, chest pain, nausea, vomiting, abdominal pain, diarrhea, dysuria, or urinary frequency/urgency.  Review of Systems:  All systems reviewed and apart from history of presenting illness, are negative.  Past Medical History:  Diagnosis Date   Anemia    low iron   Anxiety state, unspecified    Blood in stool    Chronic airway obstruction, not elsewhere classified    Coronary artery disease    Coronary atherosclerosis of artery bypass graft    Dyspnea    ED (erectile dysfunction)    Esophageal reflux    Gout, unspecified    Osteoarthrosis, unspecified whether generalized or localized, unspecified site    Other and unspecified hyperlipidemia    Other specified disorder of rectum  and anus    Personal history of tobacco use, presenting hazards to health    Sleep apnea    does not use cpap   Type II or unspecified type diabetes mellitus without mention of complication, not stated as uncontrolled    Unspecified essential hypertension     Past Surgical History:  Procedure Laterality Date   BIOPSY  09/26/2017   Procedure: BIOPSY;  Surgeon: Ladene Artist, MD;  Location: [place];  Service: Endoscopy;;   BIOPSY  02/03/2018   Procedure: BIOPSY;  Surgeon: Irving Copas., MD;  Location: [facility] ENDOSCOPY;  Service: Gastroenterology;;   COLONOSCOPY WITH PROPOFOL N/A 09/26/2017   Procedure: COLONOSCOPY WITH PROPOFOL;  Surgeon: Ladene Artist, MD;  Location: [facility] ENDOSCOPY;  Service: Endoscopy;  Laterality: N/A;   CORONARY ARTERY BYPASS GRAFT  71   LIMA to LAD, sequential saphenous vein graft to the first and second diagonal, sequential saphenous vein graft to the intermediate OM and circumflex and SVG to RCA   ESOPHAGOGASTRODUODENOSCOPY (EGD) WITH PROPOFOL N/A 09/26/2017   Procedure: ESOPHAGOGASTRODUODENOSCOPY (EGD) WITH PROPOFOL;  Surgeon: Ladene Artist, MD;  Location: [facility] ENDOSCOPY;  Service: Endoscopy;  Laterality: N/A;   ESOPHAGOGASTRODUODENOSCOPY (EGD) WITH PROPOFOL N/A 02/03/2018   Procedure: ESOPHAGOGASTRODUODENOSCOPY (EGD);  Surgeon: Irving Copas., MD;  Location: [place];  Service: Gastroenterology;  Laterality: N/A;   KNEE SURGERY     BILATERAL   POLYPECTOMY  09/26/2017   Procedure: POLYPECTOMY;  Surgeon: Ladene Artist, MD;  Location: [facility] ENDOSCOPY;  Service: Endoscopy;;  [place] INJECTION  02/03/2018   Procedure: SUBMUCOSAL INJECTION;  Surgeon: Rush Landmark Telford Nab., MD;  Location: [place];  Service: Gastroenterology;;     reports that he has quit smoking. He has never used smokeless tobacco. He reports that he does not drink alcohol and does not use drugs.  Allergies  Allergen Reactions    Benazepril Shortness Of Breath    Cough, wheezing    Family History  Problem Relation Age of Onset   Depression Sister    Heart disease Sister    Heart disease Mother 52       CAD   Mental illness Father        Alzheimer's   Hypertension Other    Coronary artery disease Other        Male 1st degree relative <50   Colon cancer Neg Hx    Stomach cancer Neg Hx    Esophageal cancer Neg Hx    Liver disease Neg Hx    Pancreatic cancer Neg Hx    Rectal cancer Neg Hx     Prior to Admission medications   Medication Sig Start Date End Date Taking? Authorizing Provider  amLODipine (NORVASC) 10 MG tablet Take 1 tablet (10 mg total) by mouth daily. 06/07/20  Yes Plotnikov, Evie Lacks, MD  aspirin 81 MG EC tablet Take 1 tablet (81 mg total) by mouth daily. 04/22/19  Yes Plotnikov, Evie Lacks, MD  carvedilol (COREG) 25 MG tablet TAKE 1 TABLET BY MOUTH  TWICE DAILY WITH A MEAL Patient taking differently: Take 25 mg by mouth 2 (two) times daily with a meal. 11/14/20  Yes Plotnikov, Evie Lacks, MD  Cholecalciferol (VITAMIN D3) 50 MCG (2000 UT) capsule Take 1 capsule (2,000 Units total) by mouth daily. 04/23/19  Yes Plotnikov, Evie Lacks, MD  colchicine 0.6 MG tablet Take 1 tablet (0.6 mg total) by mouth 2 (two) times daily as needed. Patient taking differently: Take 0.6 mg by mouth 2 (two) times daily as needed (gout flare). 04/25/21  Yes Plotnikov, Evie Lacks, MD  Cyanocobalamin (VITAMIN B-12) 1000 MCG SUBL Place 1 tablet (1,000 mcg total) under the tongue daily. 04/23/19  Yes Plotnikov, Evie Lacks, MD  docusate sodium (COLACE) 100 MG capsule Take 1 capsule (100 mg total) by mouth 2 (two) times daily. Patient taking differently: Take 100 mg by mouth daily as needed for mild constipation. 07/26/15  Yes Donne Hazel, MD  famotidine (PEPCID) 40 MG tablet Take 1 tablet (40 mg total) by mouth daily. 05/11/20  Yes Plotnikov, Evie Lacks, MD  ferrous sulfate 325 (65 FE) MG tablet Take 1 tablet (325 mg total) by mouth  daily. 04/23/19 05/15/21 Yes Plotnikov, Evie Lacks, MD  gabapentin (NEURONTIN) 300 MG capsule Take 300 mg by mouth 3 (three) times daily. 02/21/21  Yes [provider]  hydrALAZINE (APRESOLINE) 50 MG tablet TAKE 1 TABLET BY MOUTH 3  TIMES DAILY Patient taking differently: Take 50 mg by mouth 3 (three) times daily. 08/18/20  Yes Plotnikov, Evie Lacks, MD  HYDROcodone-acetaminophen (NORCO) 5-325 MG tablet Take 1 tablet by mouth every 6 (six) hours as needed for moderate pain. 12/05/20  Yes Plotnikov, Evie Lacks, MD  irbesartan (AVAPRO) 150 MG tablet Take 0.5 tablets (75 mg total) by mouth daily. 10/12/20  Yes Plotnikov, Evie Lacks, MD  omeprazole (PRILOSEC) 40 MG capsule TAKE 1 CAPSULE BY MOUTH  DAILY Patient taking differently: Take 40 mg by mouth daily. 02/06/21  Yes Plotnikov, Evie Lacks, MD  ondansetron (ZOFRAN) 4 MG tablet Take 1 tablet (4 mg total) by mouth every 8 (eight) hours as needed for nausea or vomiting. 09/02/19  Yes Plotnikov, Evie Lacks, MD  pravastatin (PRAVACHOL) 80 MG tablet TAKE 1 TABLET BY MOUTH AT  BEDTIME Patient taking differently: Take 80 mg by mouth at bedtime. 08/18/20  Yes Plotnikov, Evie Lacks, MD  tadalafil (ADCIRCA/CIALIS) 20 MG tablet Take 20 mg by mouth as needed for erectile dysfunction.   Yes [provider]  torsemide (DEMADEX) 100 MG tablet Take 0.5 tablets (50 mg total) by mouth daily. Patient taking differently: Take 100 mg by mouth daily. 05/13/20  Yes Plotnikov, Evie Lacks, MD  Blood Glucose Monitoring Suppl (ONETOUCH VERIO FLEX SYSTEM) w/Device KIT USE TO MONITOR BLOOD SUGAR 06/28/20   Plotnikov, Evie Lacks, MD  cyclobenzaprine (FLEXERIL) 10 MG tablet Take 1 tablet (10 mg total) by mouth at bedtime. Patient not taking: Reported on 05/15/2021 09/02/19   Plotnikov, Evie Lacks, MD  glucose blood (ONETOUCH VERIO) test strip CHECK BLOOD SUGAR 1 TO 2  TIMES DAILY AS DIRECTED 06/28/20   Plotnikov, Evie Lacks, MD  Lancets (ONETOUCH DELICA PLUS [id number]) MISC USE AS DIRECTED  06/28/20   Plotnikov, Evie Lacks, MD  potassium chloride (KLOR-CON) 10 MEQ tablet TAKE 1 TABLET(10 MEQ) BY MOUTH DAILY Patient not taking: Reported on 05/15/2021 12/09/20   Cassandria Anger, MD    Physical Exam: Vitals:   05/16/21 0645 05/16/21 0700 05/16/21 0715 05/16/21 0735  BP: (!) 141/72 (!) 149/79 124/90 (!) 151/79  Pulse: 66 63 66 85  Resp: (!) _0 (!) 29  Temp:      TempSrc:      SpO2: 95% 97% 97% 93%  Weight:      Height:        Physical Exam Constitutional:      General: He is not in acute distress. HENT:     Head: Normocephalic and atraumatic.  Eyes:     Extraocular Movements: Extraocular movements intact.     Conjunctiva/sclera: Conjunctivae normal.  Cardiovascular:     Rate and Rhythm: Normal rate and regular rhythm.     Pulses: Normal pulses.  Pulmonary:     Effort: Pulmonary effort is normal. No respiratory distress.     Breath sounds: Normal breath sounds. No wheezing.  Abdominal:     General: Bowel sounds are normal. There is no distension.     Palpations: Abdomen is soft.     Tenderness: There is no abdominal tenderness.  Musculoskeletal:     Cervical back: Normal range of motion and neck supple.     Comments: Right elbow swollen, warm to touch with overlying skin erythema  Skin:    General: Skin is warm and dry.  Neurological:     General: No focal deficit present.     Mental Status: He is alert and oriented to person, place, and time.     Cranial Nerves: No cranial nerve deficit.     Sensory: No sensory deficit.     Motor: No weakness.     Labs on Admission: I have personally reviewed following labs and imaging studies  CBC: Recent Labs  Lab 05/15/21 1747 05/16/21 0739  WBC 12.9* 10.2  HGB 10.6* 10.1*  HCT 31.7* 30.7*  MCV 86.4 86.7  PLT 244 413   Basic Metabolic Panel: Recent Labs  Lab 05/15/21 1747  NA 139  K 3.2*  CL 101  CO2 27  GLUCOSE 150*  BUN 65*  CREATININE 3.55*  CALCIUM 9.2   GFR: Estimated Creatinine  Clearance: 18.8 mL/min (A) (by C-G formula based on SCr of 3.55 mg/dL (H)). Liver Function Tests: No results for input(s): AST, ALT, ALKPHOS, BILITOT, PROT, ALBUMIN in the last 168 hours. No results for input(s): LIPASE, AMYLASE in the last 168 hours. No results for input(s): AMMONIA in the last 168 hours. Coagulation Profile: No results for input(s): INR, PROTIME in the last 168 hours. Cardiac Enzymes: Recent Labs  Lab 05/15/21 2215  CKTOTAL 138   BNP (last 3 results) No results for input(s): PROBNP in the last 8760 hours. HbA1C: No results for input(s): HGBA1C in the last 72 hours. CBG: Recent Labs  Lab 05/15/21 2046 05/16/21 0011 05/16/21 0802  GLUCAP 183* 126* 143*   Lipid Profile: No results for input(s): CHOL, HDL, LDLCALC, TRIG, CHOLHDL, LDLDIRECT in the last 72 hours. Thyroid Function Tests: No results for input(s): TSH, T4TOTAL, FREET4, T3FREE, THYROIDAB in the last 72 hours. Anemia Panel: No results for input(s): VITAMINB12, FOLATE, FERRITIN, TIBC, IRON, RETICCTPCT in the last 72 hours. Urine analysis:    Component Value Date/Time   COLORURINE YELLOW 05/15/2021 2221   APPEARANCEUR CLEAR 05/15/2021 2221   LABSPEC 1.010 05/15/2021 2221   PHURINE 5.0 05/15/2021 2221   GLUCOSEU NEGATIVE 05/15/2021 2221   GLUCOSEU 100 (A) 10/20/2018 1624   HGBUR NEGATIVE 05/15/2021 2221   BILIRUBINUR NEGATIVE 05/15/2021 2221   KETONESUR NEGATIVE 05/15/2021 2221   PROTEINUR NEGATIVE 05/15/2021 2221   UROBILINOGEN 0.2 10/20/2018 1624   NITRITE NEGATIVE 05/15/2021 2221   LEUKOCYTESUR NEGATIVE 05/15/2021 2221    Radiological Exams on Admission: I have personally reviewed images DG Chest 2 View  Result Date: 05/15/2021 CLINICAL DATA:  Fever. EXAM: CHEST - 2 VIEW COMPARISON:  Chest x-ray 11/29/2020. FINDINGS: Sternotomy wires are present. The aorta is tortuous. Cardiac silhouette is within normal limits. Lungs are clear. No pleural effusion or pneumothorax. No acute fractures.  IMPRESSION: 1. No acute cardiopulmonary process. Electronically Signed   By: Ronney Asters M.D.   On: 05/15/2021 18:04    EKG: I have personally reviewed EKG: Sinus rhythm, T wave abnormality in inferior and lateral leads.  No significant change since prior tracing.  Assessment and Plan: * Septic bursitis of elbow, right- (present on admission) Febrile and labs showing mild leukocytosis.  Underwent aspiration with 10 mL cloudy fluid removed: 3900 WBCs (81% neutrophils) concerning for infection, culture pending.  Vital signs stable at this time, no signs of sepsis. -Continue vancomycin and follow cultures.  Monitor WBC count.  Pain management.  Hypokalemia- (present on admission) Likely due to diuretic use. -Monitor potassium and magnesium levels, replace if low.  Generalized weakness No focal neurodeficit on exam. -PT/OT eval, fall precautions  CAD (coronary artery disease)- (present on admission) Troponin mildly elevated in setting of CKD but stable.  Patient is not endorsing chest pain. -Continue aspirin, Coreg, and pravastatin  Type 2 diabetes mellitus ([place]) Diet controlled, A1c 6.0 on 05/03/2021. -Sensitive sliding scale insulin  CKD (chronic kidney disease) stage 4, GFR 15-29 ml/min (HCC)- (present on admission) Progressively worsening renal function over the past year.  Creatinine was 1.8 a year ago and now increased to 3.5.  He is taking torsemide 100 mg daily instead of 50 mg daily prescribed. -Continue torsemide 50 mg daily.  Outpatient nephrology follow-up.  Essential hypertension- (present on admission) -Continue amlodipine, Coreg, hydralazine, irbesartan, torsemide  Chronic diastolic CHF (congestive heart failure) ([place])- (present on admission) Stable. -Continue torsemide 50 mg daily and monitor volume status  plus renal function closely.   DVT prophylaxis: SQ Heparin Code Status: Full Code.  Discussed with the patient. Family Communication: No family available at this  time. Level of care: Med-Surg Admission status: It is my clinical opinion that admission to INPATIENT is reasonable and necessary because of the expectation that this patient will require hospital care that crosses at least 2 midnights to treat this condition based on the medical complexity of the problems presented.  Given the aforementioned information, the predictability of an adverse outcome is felt to be significant.   Shela Leff, MD [facility] 05/16/2021, 8:07 AM

## 2021-05-16 NOTE — Assessment & Plan Note (Addendum)
Likely due to gout-synovial fluid cultures negative for infection-crystals were seen on analysis.  Remarkable improvement after starting empiric steroids-he is now able to move the elbow quite a lot more than when he first presented to the ED.  He was also on empiric antibiotics but since cultures are negative-we will stop empiric antimicrobial therapy on discharge.  Right upper extremity Doppler was negative for DVT.  Both pain and swelling have significantly decreased.  He is stable for discharge on tapering steroids.  PCP to consider outpatient referral to rheumatology.

## 2021-05-16 NOTE — Assessment & Plan Note (Addendum)
Diet controlled at home.  CBG stable with SSI  Recent Labs    05/17/21 1716 05/17/21 2028 05/18/21 0754  GLUCAP 222* 198* 147*

## 2021-05-17 ENCOUNTER — Ambulatory Visit: Payer: Medicare Other

## 2021-05-17 ENCOUNTER — Inpatient Hospital Stay (HOSPITAL_COMMUNITY): Payer: Medicare Other

## 2021-05-17 DIAGNOSIS — R609 Edema, unspecified: Secondary | ICD-10-CM

## 2021-05-17 DIAGNOSIS — R2689 Other abnormalities of gait and mobility: Secondary | ICD-10-CM

## 2021-05-17 LAB — CBC
HCT: 28.1 % — ABNORMAL LOW (ref 39.0–52.0)
Hemoglobin: 9.2 g/dL — ABNORMAL LOW (ref 13.0–17.0)
MCH: 28.1 pg (ref 26.0–34.0)
MCHC: 32.7 g/dL (ref 30.0–36.0)
MCV: 85.9 fL (ref 80.0–100.0)
Platelets: 202 10*3/uL (ref 150–400)
RBC: 3.27 MIL/uL — ABNORMAL LOW (ref 4.22–5.81)
RDW: 13.1 % (ref 11.5–15.5)
WBC: 12.9 10*3/uL — ABNORMAL HIGH (ref 4.0–10.5)
nRBC: 0 % (ref 0.0–0.2)

## 2021-05-17 LAB — MAGNESIUM: Magnesium: 2.1 mg/dL (ref 1.7–2.4)

## 2021-05-17 LAB — URINE CULTURE

## 2021-05-17 LAB — GLUCOSE, CAPILLARY
Glucose-Capillary: 126 mg/dL — ABNORMAL HIGH (ref 70–99)
Glucose-Capillary: 127 mg/dL — ABNORMAL HIGH (ref 70–99)
Glucose-Capillary: 131 mg/dL — ABNORMAL HIGH (ref 70–99)
Glucose-Capillary: 198 mg/dL — ABNORMAL HIGH (ref 70–99)
Glucose-Capillary: 222 mg/dL — ABNORMAL HIGH (ref 70–99)

## 2021-05-17 LAB — BASIC METABOLIC PANEL
Anion gap: 15 (ref 5–15)
BUN: 56 mg/dL — ABNORMAL HIGH (ref 8–23)
CO2: 23 mmol/L (ref 22–32)
Calcium: 9.3 mg/dL (ref 8.9–10.3)
Chloride: 103 mmol/L (ref 98–111)
Creatinine, Ser: 2.88 mg/dL — ABNORMAL HIGH (ref 0.61–1.24)
GFR, Estimated: 21 mL/min — ABNORMAL LOW (ref >60)
Glucose, Bld: 146 mg/dL — ABNORMAL HIGH (ref 70–99)
Potassium: 4 mmol/L (ref 3.5–5.1)
Sodium: 141 mmol/L (ref 135–145)

## 2021-05-17 MED ORDER — LINEZOLID 600 MG PO TABS
600.0000 mg | ORAL_TABLET | Freq: Two times a day (BID) | ORAL | Status: DC
  Administered 2021-05-18: 600 mg via ORAL
  Filled 2021-05-17 (×2): qty 1

## 2021-05-17 MED ORDER — ENSURE MAX PROTEIN PO LIQD
11.0000 [oz_av] | Freq: Two times a day (BID) | ORAL | Status: DC
  Administered 2021-05-18: 11 [oz_av] via ORAL
  Filled 2021-05-17 (×3): qty 330

## 2021-05-17 MED ORDER — ADULT MULTIVITAMIN W/MINERALS CH
1.0000 | ORAL_TABLET | Freq: Every day | ORAL | Status: DC
  Administered 2021-05-17 – 2021-05-18 (×2): 1 via ORAL
  Filled 2021-05-17 (×2): qty 1

## 2021-05-17 MED ORDER — GABAPENTIN 300 MG PO CAPS
300.0000 mg | ORAL_CAPSULE | Freq: Two times a day (BID) | ORAL | Status: DC
  Administered 2021-05-17 – 2021-05-18 (×3): 300 mg via ORAL
  Filled 2021-05-17 (×4): qty 1

## 2021-05-17 NOTE — Evaluation (Addendum)
Physical Therapy Evaluation Patient Details Name: Harold Pittman MRN: [id number] DOB: 08/06/1940 Today's Date: 05/17/2021  History of Present Illness  81 y.o. male presenting to ED 05/15/21 with R elbow pain/swelling, generalized weakness and difficulty ambulating. Physical exam concerning for R elblow septic bursitis s/p aspiration. Started on IV antibiotics. PMHx significant for COPD, CAD s/p CAGB, HTN, DMII, CKD IV, anemia and diastolic CHF.  Clinical Impression  Patient admitted with above findings. Patient presents with generalized weakness, impaired balance, and decreased activity tolerance. Patient required minA to stand from recliner and min guard for hallway ambulation with RW. Patient lives alone and independent with standard [name] or SPC at baseline. Patient will have difficulty lifting standard [name] at home due to R elbow pain and would benefit from RW at discharge. Patient will benefit from skilled PT services during acute stay to address listed deficits. Recommend HHPT at discharge to maximize functional independence and safety.        Recommendations for follow up therapy are one component of a multi-disciplinary discharge planning process, led by the attending physician.  Recommendations may be updated based on patient status, additional functional criteria and insurance authorization.  Follow Up Recommendations Home health PT    Assistance Recommended at Discharge PRN  Patient can return home with the following       Equipment Recommendations Rolling Carlyne Keehan (2 wheels)  Recommendations for Other Services       Functional Status Assessment Patient has had a recent decline in their functional status and demonstrates the ability to make significant improvements in function in a reasonable and predictable amount of time.     Precautions / Restrictions Precautions Precautions: Fall Restrictions Weight Bearing Restrictions: No      Mobility  Bed Mobility                General bed mobility comments: up in chair on arrival    Transfers Overall transfer level: Needs assistance Equipment used: Rolling [name] (2 wheels) Transfers: Sit to/from Stand Sit to Stand: Min assist           General transfer comment: use of momentum to stand from recliner. minA for boost up into standing. Good hand placemen t    Ambulation/Gait Ambulation/Gait assistance: Min guard Gait Distance (Feet): 150 Feet Assistive device: Rolling Harold Pittman (2 wheels) Gait Pattern/deviations: Step-through pattern, Decreased stride length, Decreased step length - left, Trunk flexed Gait velocity: decreased     General Gait Details: decreased step length on L throughout. With increasing distance, patient with flexed trunk but able to self correct without cueing. Min guard for safety. Cues for maintaining self inside RW during turns  Science writer    Modified Rankin (Stroke Patients Only)       Balance Overall balance assessment: Needs assistance Sitting-balance support: No upper extremity supported, Feet supported Sitting balance-Leahy Scale: Good     Standing balance support: Bilateral upper extremity supported, Reliant on assistive device for balance Standing balance-Leahy Scale: Poor Standing balance comment: reliant on RW                             Pertinent Vitals/Pain Pain Assessment Pain Assessment: 0-10 Pain Score: 7  Pain Location: R elbow Pain Descriptors / Indicators: Guarding, Grimacing, Discomfort Pain Intervention(s): Monitored during session, Repositioned    Home Living Family/patient expects to be discharged to:: Private residence Living Arrangements: Alone  Available Help at Discharge: Family;Available PRN/intermittently Type of Home: House Home Access: Stairs to enter   [employer] of Steps: 1   Home Layout: One level Home Equipment: Standard [employer];Cane - single point;BSC/3in1       Prior Function Prior Level of Function : Independent/Modified Independent;Driving             Mobility Comments: uses cane for community mobility and standard Harold Pittman for in home mobility       Hand Dominance        Extremity/Trunk Assessment   Upper Extremity Assessment Upper Extremity Assessment: Defer to OT evaluation    Lower Extremity Assessment Lower Extremity Assessment: Generalized weakness    Cervical / Trunk Assessment Cervical / Trunk Assessment: Kyphotic  Communication   Communication: No difficulties  Cognition Arousal/Alertness: Awake/alert Behavior During Therapy: WFL for tasks assessed/performed Overall Cognitive Status: Within Functional Limits for tasks assessed                                          General Comments      Exercises     Assessment/Plan    PT Assessment Patient needs continued PT services  PT Problem List Decreased strength;Decreased activity tolerance;Decreased balance;Decreased mobility;Pain       PT Treatment Interventions DME instruction;Gait training;Stair training;Therapeutic activities;Functional mobility training;Balance training;Therapeutic exercise;Patient/family education    PT Goals (Current goals can be found in the Care Plan section)  Acute Rehab PT Goals Patient Stated Goal: to go home PT Goal Formulation: With patient/family Time For Goal Achievement: 05/31/21 Potential to Achieve Goals: Good    Frequency Min 3X/week     Co-evaluation               AM-PAC PT "6 Clicks" Mobility  Outcome Measure Help needed turning from your back to your side while in a flat bed without using bedrails?: A Little Help needed moving from lying on your back to sitting on the side of a flat bed without using bedrails?: A Little Help needed moving to and from a bed to a chair (including a wheelchair)?: A Little Help needed standing up from a chair using your arms (e.g., wheelchair or bedside  chair)?: A Little Help needed to walk in hospital room?: A Little Help needed climbing 3-5 steps with a railing? : A Little 6 Click Score: 18    End of Session Equipment Utilized During Treatment: Gait belt Activity Tolerance: Patient tolerated treatment well Patient left: in chair;with call bell/phone within reach;with family/visitor present Nurse Communication: Mobility status PT Visit Diagnosis: Unsteadiness on feet (R26.81);Muscle weakness (generalized) (M62.81);Pain Pain - Right/Left: Right Pain - part of body: Arm    Time: 1001-1018 PT Time Calculation (min) (ACUTE ONLY): 17 min   Charges:   PT Evaluation $PT Eval Moderate Complexity: 1 Mod          Ariahna Smiddy A. Gilford Rile PT, DPT Acute Rehabilitation Services Pager [phone] Office [phone]   Linna Hoff 05/17/2021, 10:31 AM

## 2021-05-17 NOTE — Evaluation (Signed)
Occupational Therapy Evaluation Patient Details Name: Harold Pittman MRN: [id number] DOB: Dec 25, 1940 Today's Date: 05/17/2021   History of Present Illness 81 y.o. male presenting to ED 05/15/21 with R elbow pain/swelling, generalized weakness and difficulty ambulating. Physical exam concerning for R elblow septic bursitis s/p aspiration. Started on IV antibiotics. PMHx significant for COPD, CAD s/p CAGB, HTN, DMII, CKD IV, anemia and diastolic CHF.   Clinical Impression   PTA patient was living alone in a private residence and was grossly Mod I with ADLs/IADLs with use of SPC vs standard walker. Patient currently functioning below baseline demonstrating observed ADLs requiring Min A for UB ADLs and Mod A for LB ADLs with use of RW. Patient also limited by deficits listed below including pain in R elbow with movement, decreased balance and decreased activity tolerance and would benefit from continued acute OT services in prep for safe d/c home with family and [place]. Son present at bedside reports family able to provide iniital 24hr supervision/assist. OT will continue to follow acutely.        Recommendations for follow up therapy are one component of a multi-disciplinary discharge planning process, led by the attending physician.  Recommendations may be updated based on patient status, additional functional criteria and insurance authorization.   Follow Up Recommendations  Home health OT    Assistance Recommended at Discharge Frequent or constant Supervision/Assistance  Patient can return home with the following A little help with walking and/or transfers;A little help with bathing/dressing/bathroom;Assist for transportation;Assistance with cooking/housework    Functional Status Assessment  Patient has had a recent decline in their functional status and demonstrates the ability to make significant improvements in function in a reasonable and predictable amount of time.  Equipment Recommendations   None recommended by OT    Recommendations for Other Services       Precautions / Restrictions Precautions Precautions: Fall Restrictions Weight Bearing Restrictions: No      Mobility Bed Mobility Overal bed mobility: Needs Assistance Bed Mobility: Supine to Sit, Sit to Supine     Supine to sit: Min assist, HOB elevated Sit to supine: Min guard   General bed mobility comments: Able to advance BLE toward EOB. Min A to elevate trunk. Min guard for return to supine.    Transfers Overall transfer level: Needs assistance Equipment used: Rolling walker (2 wheels) Transfers: Sit to/from Stand Sit to Stand: Min assist, Mod assist           General transfer comment: Heavy Min A from elevated surfaces with cues for hand placement.      Balance Overall balance assessment: Needs assistance Sitting-balance support: No upper extremity supported, Feet supported Sitting balance-Leahy Scale: Fair Sitting balance - Comments: Patient losing balance to R, seemingly unaware. Min A to maintain dynamic sitting balance. Postural control: Right lateral lean Standing balance support: Bilateral upper extremity supported, Reliant on assistive device for balance Standing balance-Leahy Scale: Poor Standing balance comment: reliant on RW and extenral assist. L lateral lean in standing during grooming tasks at sink level. Able to correct with verbal/tactile cues.                           ADL either performed or assessed with clinical judgement   ADL Overall ADL's : Needs assistance/impaired Eating/Feeding: Set up;Sitting Eating/Feeding Details (indicate cue type and reason): Able to feed himself with L hand. Difficutly with self-feeding using RUE 2/2 pain. Grooming: Min guard;Standing Grooming Details (indicate cue  type and reason): 2/3 grooming tasks standing at sink level with RW; use of LUE to brush teeth and wash face 2/2 pain with elbow flexion on R Upper Body Bathing: Minimal  assistance;Sitting   Lower Body Bathing: Sit to/from stand;Moderate assistance   Upper Body Dressing : Minimal assistance;Sitting   Lower Body Dressing: Moderate assistance;Sit to/from stand Lower Body Dressing Details (indicate cue type and reason): Min A to doff/don footwear seated EOB. Would likely require increased assist to hike pants over hips in standing. Toilet Transfer: Minimal assistance;Rolling walker (2 wheels) Toilet Transfer Details (indicate cue type and reason): Simulated with transfer to EOB with use of RW. Cues for walker management and for hand placement.                 Vision Baseline Vision/History: 1 Wears glasses Ability to See in Adequate Light: 0 Adequate Patient Visual Report: No change from baseline Vision Assessment?: No apparent visual deficits     Perception     Praxis      Pertinent Vitals/Pain Pain Assessment Pain Assessment: Faces Faces Pain Scale: Hurts little more Pain Location: R elbow with flexion Pain Descriptors / Indicators: Guarding, Grimacing, Discomfort Pain Intervention(s): Limited activity within patient's tolerance, Monitored during session, Repositioned     Hand Dominance Right   Extremity/Trunk Assessment Upper Extremity Assessment Upper Extremity Assessment: RUE deficits/detail RUE Deficits / Details: Limited shoulder flexion and elbow flexion secondary to pain. Unable to reach mouth. AROM WFL at wrist and digits. RUE: Unable to fully assess due to pain RUE Sensation: WNL RUE Coordination: decreased fine motor;decreased gross motor   Lower Extremity Assessment Lower Extremity Assessment: Defer to PT evaluation   Cervical / Trunk Assessment Cervical / Trunk Assessment: Kyphotic   Communication Communication Communication: No difficulties   Cognition Arousal/Alertness: Awake/alert Behavior During Therapy: WFL for tasks assessed/performed Overall Cognitive Status: Within Functional Limits for tasks assessed                                        General Comments  Son present at bedside.    Exercises     Shoulder Instructions      Home Living Family/patient expects to be discharged to:: Private residence Living Arrangements: Alone Available Help at Discharge: Family;Available PRN/intermittently Type of Home: House Home Access: Stairs to enter [employer] of Steps: 1   Home Layout: One level     Bathroom Shower/Tub: Occupational psychologist: Handicapped height     Home Equipment: Radio producer - single point;BSC/3in1;Shower seat (RW delivered to room)          Prior Functioning/Environment Prior Level of Function : Independent/Modified Independent;Driving             Mobility Comments: uses cane for community mobility and standard walker for in home mobility          OT Problem List: Decreased strength;Decreased range of motion;Decreased activity tolerance;Impaired balance (sitting and/or standing);Decreased coordination;Decreased safety awareness;Decreased knowledge of use of DME or AE;Impaired UE functional use;Pain;Increased edema      OT Treatment/Interventions: Self-care/ADL training;Therapeutic exercise;Energy conservation;DME and/or AE instruction;Therapeutic activities;Patient/family education;Balance training    OT Goals(Current goals can be found in the care plan section) Acute Rehab OT Goals Patient Stated Goal: To get better OT Goal Formulation: With patient Time For Goal Achievement: 05/31/21 Potential to Achieve Goals: Good ADL Goals Pt Will Perform Eating: Independently  Pt Will Perform Grooming: with supervision;standing Pt Will Perform Upper Body Dressing: with set-up;sitting Pt Will Perform Lower Body Dressing: sit to/from stand;with supervision Pt Will Transfer to Toilet: with supervision;ambulating;regular height toilet Pt Will Perform Toileting - Clothing Manipulation and hygiene: with supervision;sit  to/from stand Additional ADL Goal #1: Patient will return demo RUE HEP with supervision A and no more than 2 verbal cues for pacing/form.  OT Frequency: Min 2X/week    Co-evaluation              AM-PAC OT "6 Clicks" Daily Activity     Outcome Measure Help from another person eating meals?: A Little Help from another person taking care of personal grooming?: A Little Help from another person toileting, which includes using toliet, bedpan, or urinal?: A Little Help from another person bathing (including washing, rinsing, drying)?: A Lot Help from another person to put on and taking off regular upper body clothing?: A Little Help from another person to put on and taking off regular lower body clothing?: A Lot 6 Click Score: 16   End of Session Equipment Utilized During Treatment: Gait belt;Rolling walker (2 wheels) Nurse Communication: Mobility status  Activity Tolerance: Patient tolerated treatment well Patient left: in bed;with call bell/phone within reach;with bed alarm set;with family/visitor present  OT Visit Diagnosis: Unsteadiness on feet (R26.81);Muscle weakness (generalized) (M62.81);Pain Pain - Right/Left: Right Pain - part of body: Arm                Time: 8676-7209 OT Time Calculation (min): 33 min Charges:  OT General Charges $OT Visit: 1 Visit OT Evaluation $OT Eval Moderate Complexity: 1 Mod OT Treatments $Self Care/Home Management : 8-22 mins  Brennah Quraishi H. OTR/L Supplemental OT, Department of rehab services [phone]  Shamarion Coots R H. 05/17/2021, 1:24 PM

## 2021-05-17 NOTE — Progress Notes (Addendum)
Pharmacy Antibiotic Note  Harold Pittman is a 81 y.o. male admitted on 05/15/2021 with bursitis.  Pharmacy has been consulted for Vancomycin dosing.  Staph aureus is the most common organism associated with type of  infection. D/t to his CKD, we will change vanc to PO linezolid. Culture is still pending. Start linezolid in AM since vanc is still in the system.   We will also adjust his gabapentin dose based on renal function. Dr. Sloan Leiter approved the changes.   Plan: Dc vanc Linezolid 600mg  PO BID - start 2/9 Reduce gabapentin to 300mg  PO BID  Height: 6\' 1"  (185.4 cm) Weight: 75.9 kg (167 lb 5.3 oz) IBW/kg (Calculated) : 79.9  Temp (24hrs), Avg:99.6 F (37.6 C), Min:99.6 F (37.6 C), Max:99.6 F (37.6 C)  Recent Labs  Lab 05/15/21 1747 05/16/21 0739 05/17/21 0332  WBC 12.9* 10.2 12.9*  CREATININE 3.55* 3.33* 2.88*     Estimated Creatinine Clearance: 22 mL/min (A) (by C-G formula based on SCr of 2.88 mg/dL (H)).    Allergies  Allergen Reactions   Benazepril Shortness Of Breath    Cough, wheezing   Vancomycin 2/7 x1 Linezolid 2/9>>  2/7 BCx: ngtd 2/7 synovial: ngtd 2/6 urine>>neg  Onnie Boer, PharmD, BCIDP, AAHIVP, CPP Infectious Disease Pharmacist 05/17/2021 8:52 AM

## 2021-05-17 NOTE — Progress Notes (Signed)
PROGRESS NOTE        PATIENT DETAILS Name: Harold Pittman Age: 81 y.o. Sex: male Date of Birth: 1940-08-27 Admit Date: 05/15/2021 Admitting Physician Shela Leff, MD OTL:[id number], Evie Lacks, MD  Brief Summary: Patient is a 82 y.o.  male history of DM-2, HTN,Gout, COPD, CAD s/p CABG, IgG kappa monoclonal paraproteinemia-who presented with weakness, AKI on CKD stage IV and right olecranon bursitis.  Significant Hospital events: 2/06>> admit to [facility] for AKI/right olecranon bursitis.  Significant imaging studies: 2/06>> CXR: No pneumonia  Significant microbiology data: 2/06>> COVID/influenza PCR: Negative 2/06>> blood culture: negative 2/06>> synovial fluid culture: negative (WBC 3900, intracellular monosodium urate crystals)  Procedures: 2/07>>Arthrocentesis right elbow-by ED  Consults:  None  Subjective: Elbow pain better-has some persistent swelling in his right arm area.  Objective: Vitals: Blood pressure 130/67, pulse 72, temperature (!) 100.6 F (38.1 C), temperature source Oral, resp. rate 18, height 6\' 1"  (1.854 m), weight 75.9 kg, SpO2 97 %.   Exam: Gen Exam:Alert awake-not in any distress HEENT:atraumatic, normocephalic Chest: B/L clear to auscultation anteriorly CVS:S1S2 regular Abdomen:soft non tender, non distended Extremities: Right elbow area-less swollen minimally tender.  Some swelling persists in the arm area. Neurology: Non focal Skin: no rash   Pertinent Labs/Radiology: CBC Latest Ref Rng & Units 05/17/2021 05/16/2021 05/15/2021  WBC 4.0 - 10.5 K/uL 12.9(H) 10.2 12.9(H)  Hemoglobin 13.0 - 17.0 g/dL 9.2(L) 10.1(L) 10.6(L)  Hematocrit 39.0 - 52.0 % 28.1(L) 30.7(L) 31.7(L)  Platelets 150 - 400 K/uL 202 208 244    Lab Results  Component Value Date   NA 141 05/17/2021   K 4.0 05/17/2021   CL 103 05/17/2021   CO2 23 05/17/2021      Assessment/Plan: Olecranon bursitis, right elbow- (present on admission) Pain/swelling  improving-has mild leukocytosis but has now been started on prednisone.  Continue antimicrobial therapy for 1 additional day-if cultures continue to be negative-suspect we could discontinue all antimicrobial therapy on 2/9.  Continue prednisone at current dosing-we will taper off over the next few days.  Appears to have a little bit more swelling in his upper arm today-we will check right upper extremity Doppler to make sure he does not have DVT.  CKD (chronic kidney disease) stage 4, GFR 15-29 ml/min (HCC)- (present on admission) Has progressive worsening renal function over the past 1 year-apparently is taking double dosing of torsemide that he is supposed to be on.  Renal function gradually improving-continue to hold torsemide and ARB today.  Generalized weakness Due to acute illness-advanced age/frailty-appreciate PT/OT eval-will arrange for home health services prior to discharge.  Gout- (present on admission) Per patient-he has a known history of gout-review of medications reveals that he is only on colchicine as needed.  Avoid colchicine for now given CKD stage IV.  On prednisone-will taper over the next few days.  Chronic diastolic CHF (congestive heart failure) ([place])- (present on admission) Euvolemic-continue to hold torsemide-allow renal function to improve further.  Essential hypertension- (present on admission) BP stable-Continue amlodipine, Coreg, hydralazine-hold, irbesartan and torsemide  Type 2 diabetes mellitus (A1c 6.0 on 05/03/2021) Diet controlled at home.  CBG stable with SSI-watch closely as patient will be started on steroids.  Recent Labs    05/17/21 0045 05/17/21 0721 05/17/21 1134  GLUCAP 127* 126* 131*     CAD (coronary artery disease)- (present on admission) No anginal symptoms-continue  aspirin, Coreg and statin.  Troponins minimally elevated and of no clinical significance.   Hypokalemia- (present on admission) Repleted-recheck  periodically.   BMI: Estimated body mass index is 22.08 kg/m as calculated from the following:   Height as of this encounter: 6\' 1"  (1.854 m).   Weight as of this encounter: 75.9 kg.   Code status:   Code Status: Full Code   DVT Prophylaxis: heparin injection 5,000 Units Start: 05/16/21 0815   Family Communication: Daughter [name] Michael-[phone]-updated over the phone on 2/8   Disposition Plan: Status is: Inpatient Remains inpatient appropriate because: Gout flare causing bursitis-ruling out infectious etiology-remains antibiotics-empiric steroids-slowly improving-needs RUE Doppler to rule out DVT before consideration of discharge.  Likely discharge home on 2/9 with home health.   Planned Discharge Destination:Home health   Diet: Diet Order             Diet heart healthy/carb modified Room service appropriate? Yes; Fluid consistency: Thin  Diet effective now                     Antimicrobial agents: Anti-infectives (From admission, onward)    Start     Dose/Rate Route Frequency Ordered Stop   05/19/21 0200  vancomycin (VANCOCIN) IVPB 1000 mg/200 mL premix  Status:  Discontinued        1,000 mg 200 mL/hr over 60 Minutes Intravenous Every 48 hours 05/16/21 0729 05/17/21 0854   05/18/21 0800  linezolid (ZYVOX) tablet 600 mg        600 mg Oral 2 times daily 05/17/21 0854     05/16/21 0015  vancomycin (VANCOREADY) IVPB 1500 mg/300 mL        1,500 mg 150 mL/hr over 120 Minutes Intravenous  Once 05/16/21 0001 05/16/21 0645        MEDICATIONS: Scheduled Meds:  amLODipine  10 mg Oral Daily   aspirin EC  81 mg Oral Daily   carvedilol  25 mg Oral BID WC   gabapentin  300 mg Oral BID   heparin injection (subcutaneous)  5,000 Units Subcutaneous Q8H   hydrALAZINE  50 mg Oral TID   insulin aspart  0-5 Units Subcutaneous QHS   insulin aspart  0-9 Units Subcutaneous TID WC   [START ON 05/18/2021] linezolid  600 mg Oral BID   pantoprazole  40 mg Oral Daily    pravastatin  80 mg Oral QHS   predniSONE  40 mg Oral Q breakfast   Continuous Infusions:   PRN Meds:.acetaminophen **OR** acetaminophen   I have personally reviewed following labs and imaging studies  LABORATORY DATA: CBC: Recent Labs  Lab 05/15/21 1747 05/16/21 0739 05/17/21 0332  WBC 12.9* 10.2 12.9*  HGB 10.6* 10.1* 9.2*  HCT 31.7* 30.7* 28.1*  MCV 86.4 86.7 85.9  PLT 244 208 341    Basic Metabolic Panel: Recent Labs  Lab 05/15/21 1747 05/16/21 0739 05/17/21 0332  NA 139 141 141  K 3.2* 3.4* 4.0  CL 101 103 103  CO2 27 27 23   GLUCOSE 150* 146* 146*  BUN 65* 62* 56*  CREATININE 3.55* 3.33* 2.88*  CALCIUM 9.2 9.2 9.3  MG  --  2.0 2.1    GFR: Estimated Creatinine Clearance: 22 mL/min (A) (by C-G formula based on SCr of 2.88 mg/dL (H)).  Liver Function Tests: No results for input(s): AST, ALT, ALKPHOS, BILITOT, PROT, ALBUMIN in the last 168 hours. No results for input(s): LIPASE, AMYLASE in the last 168 hours. No results for input(s): AMMONIA in  the last 168 hours.  Coagulation Profile: No results for input(s): INR, PROTIME in the last 168 hours.  Cardiac Enzymes: Recent Labs  Lab 05/15/21 2215  CKTOTAL 138    BNP (last 3 results) No results for input(s): PROBNP in the last 8760 hours.  Lipid Profile: No results for input(s): CHOL, HDL, LDLCALC, TRIG, CHOLHDL, LDLDIRECT in the last 72 hours.  Thyroid Function Tests: No results for input(s): TSH, T4TOTAL, FREET4, T3FREE, THYROIDAB in the last 72 hours.  Anemia Panel: No results for input(s): VITAMINB12, FOLATE, FERRITIN, TIBC, IRON, RETICCTPCT in the last 72 hours.  Urine analysis:    Component Value Date/Time   COLORURINE YELLOW 05/15/2021 2221   APPEARANCEUR CLEAR 05/15/2021 2221   LABSPEC 1.010 05/15/2021 2221   PHURINE 5.0 05/15/2021 2221   GLUCOSEU NEGATIVE 05/15/2021 2221   GLUCOSEU 100 (A) 10/20/2018 1624   HGBUR NEGATIVE 05/15/2021 2221   BILIRUBINUR NEGATIVE 05/15/2021 2221    KETONESUR NEGATIVE 05/15/2021 2221   PROTEINUR NEGATIVE 05/15/2021 2221   UROBILINOGEN 0.2 10/20/2018 1624   NITRITE NEGATIVE 05/15/2021 2221   LEUKOCYTESUR NEGATIVE 05/15/2021 2221    Sepsis Labs: Lactic Acid, Venous    Component Value Date/Time   LATICACIDVEN 1.2 07/23/2015 0950    MICROBIOLOGY: Recent Results (from the past 240 hour(s))  Resp Panel by RT-PCR (Flu A&B, Covid) Nasopharyngeal Swab     Status: None   Collection Time: 05/15/21  8:47 PM   Specimen: Nasopharyngeal Swab; Nasopharyngeal(NP) swabs in vial transport medium  Result Value Ref Range Status   SARS Coronavirus 2 by RT PCR NEGATIVE NEGATIVE Final    Comment: (NOTE) SARS-CoV-2 target nucleic acids are NOT DETECTED.  The SARS-CoV-2 RNA is generally detectable in upper respiratory specimens during the acute phase of infection. The lowest concentration of SARS-CoV-2 viral copies this assay can detect is 138 copies/mL. A negative result does not preclude SARS-Cov-2 infection and should not be used as the sole basis for treatment or other patient management decisions. A negative result may occur with  improper specimen collection/handling, submission of specimen other than nasopharyngeal swab, presence of viral mutation(s) within the areas targeted by this assay, and inadequate number of viral copies(<138 copies/mL). A negative result must be combined with clinical observations, patient history, and epidemiological information. The expected result is Negative.  Fact Sheet for Patients:  EntrepreneurPulse.com.au  Fact Sheet for Healthcare Providers:  IncredibleEmployment.be  This test is no t yet approved or cleared by the [place] FDA and  has been authorized for detection and/or diagnosis of SARS-CoV-2 by FDA under an Emergency Use Authorization (EUA). This EUA will remain  in effect (meaning this test can be used) for the duration of the COVID-19 declaration under  Section 564(b)(1) of the Act, 21 U.S.C.section 360bbb-3(b)(1), unless the authorization is terminated  or revoked sooner.       Influenza A by PCR NEGATIVE NEGATIVE Final   Influenza B by PCR NEGATIVE NEGATIVE Final    Comment: (NOTE) The Xpert Xpress SARS-CoV-2/FLU/RSV plus assay is intended as an aid in the diagnosis of influenza from Nasopharyngeal swab specimens and should not be used as a sole basis for treatment. Nasal washings and aspirates are unacceptable for Xpert Xpress SARS-CoV-2/FLU/RSV testing.  Fact Sheet for Patients: EntrepreneurPulse.com.au  Fact Sheet for Healthcare Providers: IncredibleEmployment.be  This test is not yet approved or cleared by the [place] FDA and has been authorized for detection and/or diagnosis of SARS-CoV-2 by FDA under an Emergency Use Authorization (EUA). This EUA will remain  in effect (meaning this test can be used) for the duration of the COVID-19 declaration under Section 564(b)(1) of the Act, 21 U.S.C. section 360bbb-3(b)(1), unless the authorization is terminated or revoked.  Performed at [facility], [place] [address] [place], [place] [address]   Urine Culture     Status: Abnormal   Collection Time: 05/15/21 10:21 PM   Specimen: Urine, Random  Result Value Ref Range Status   Specimen Description URINE, RANDOM  Final   Special Requests   Final    NONE Performed at [facility], [place] [address], [place], [place] [address]    Culture MULTIPLE SPECIES PRESENT, SUGGEST RECOLLECTION (A)  Final   Report Status 05/17/2021 FINAL  Final  Body fluid culture w Gram Stain     Status: None (Preliminary result)   Collection Time: 05/15/21 11:45 PM   Specimen: Body Fluid  Result Value Ref Range Status   Specimen Description FLUID SYNOVIAL  Final   Special Requests BURSA CYST  Final   Gram Stain   Final    FEW WBC PRESENT,BOTH PMN AND MONONUCLEAR NO ORGANISMS SEEN    Culture   Final     NO GROWTH 1 DAY Performed at [facility], [place] [address] [place], [place] [address]    Report Status PENDING  Incomplete  Blood culture (routine x 2)     Status: None (Preliminary result)   Collection Time: 05/16/21 12:08 AM   Specimen: BLOOD RIGHT ARM  Result Value Ref Range Status   Specimen Description BLOOD RIGHT ARM  Final   Special Requests   Final    BOTTLES DRAWN AEROBIC AND ANAEROBIC Blood Culture results may not be optimal due to an excessive volume of blood received in culture bottles   Culture   Final    NO GROWTH 1 DAY Performed at [facility], [place] [address] [place], [place] [address]    Report Status PENDING  Incomplete  Blood culture (routine x 2)     Status: None (Preliminary result)   Collection Time: 05/16/21 12:09 AM   Specimen: BLOOD LEFT ARM  Result Value Ref Range Status   Specimen Description BLOOD LEFT ARM  Final   Special Requests   Final    BOTTLES DRAWN AEROBIC AND ANAEROBIC Blood Culture adequate volume   Culture   Final    NO GROWTH 1 DAY Performed at [facility], [place] [address] [place], [place] [address]    Report Status PENDING  Incomplete    RADIOLOGY STUDIES/RESULTS: DG Chest 2 View  Result Date: 05/15/2021 CLINICAL DATA:  Fever. EXAM: CHEST - 2 VIEW COMPARISON:  Chest x-ray 11/29/2020. FINDINGS: Sternotomy wires are present. The aorta is tortuous. Cardiac silhouette is within normal limits. Lungs are clear. No pleural effusion or pneumothorax. No acute fractures. IMPRESSION: 1. No acute cardiopulmonary process. Electronically Signed   By: Ronney Asters M.D.   On: 05/15/2021 18:04     LOS: 1 day   Oren Binet, MD  [facility]    To contact the attending provider between 7A-7P or the covering provider during after hours 7P-7A, please log into the web site [email or web address] and access using universal [place] password for that web site. If you do not have the password, please call the hospital  operator.  05/17/2021, 12:34 PM

## 2021-05-17 NOTE — TOC Initial Note (Signed)
Transition of Care [facility]) - Initial/Assessment Note    Patient Details  Name: Harold Pittman MRN: [id number] Date of Birth: September 08, 1940  Transition of Care [facility]) CM/SW Contact:    Tom-Johnson, Renea Ee, RN Phone Number: 05/17/2021, 11:27 AM  Clinical Narrative:                  CM spoke with patient and daughter, [name] at bedside about needs for post hospital transition. Admitted for Hypokalemia, Bursitis and had it aspirated. Patient states he lives alone at home. Has two supportive children. States he was independent with care and drive self prior to admission. Has a cane, walker without wheels, shower seat at home. PCP is Plotnikov, Evie Lacks, MD and uses [place] [facility] [place] and [facility]. Also uses [facility] for his medications. PT recommended home health.CM gave patient and [place] list of agencies from [employer].gov and they chose [facility]. Referral made with Erline Levine at [facility] with acceptance noted. Rolling walker with wheels ordered form [facility] and Freda Munro to deliver to bedside. Family to transport at discharge. CM will continue to follow with needs.  Expected Discharge Plan: [place] Barriers to Discharge: Continued Medical Work up   Patient Goals and CMS Choice Patient states their goals for this hospitalization and ongoing recovery are:: To return home CMS Medicare.gov Compare Post Acute Care list provided to:: Patient Choice offered to / list presented to : Patient, Adult Children (Daughter, [name].)  Expected Discharge Plan and Services Expected Discharge Plan: [place]   Discharge Planning Services: CM Consult Post Acute Care Choice: [place] arrangements for the past 2 months: Single Family Home                 DME Arranged: Walker rolling DME Agency: [facility] Date DME Agency Contacted: 05/17/21 Time DME Agency Contacted: 1120 Representative spoke with at DME Agency: Freda Munro HH Arranged:  PT, OT HH Agency: [place] Date [place]: 05/17/21 Time [place]: 38 Representative spoke with at [place]: [place] Arrangements/Services Living arrangements for the past 2 months: [place] with:: Self Patient language and need for interpreter reviewed:: Yes Do you feel safe going back to the place where you live?: Yes      Need for Family Participation in Patient Care: Yes (Comment) Care giver support system in place?: Yes (comment) Current home services: DME (Cane, walker, shower seat.) Criminal Activity/Legal Involvement Pertinent to Current Situation/Hospitalization: No - Comment as needed  Activities of Daily Living Home Assistive Devices/Equipment: Cane (specify quad or straight), Walker (specify type) ADL Screening (condition at time of admission) Patient's cognitive ability adequate to safely complete daily activities?: Yes Is the patient deaf or have difficulty hearing?: No Does the patient have difficulty seeing, even when wearing glasses/contacts?: No Does the patient have difficulty concentrating, remembering, or making decisions?: No Patient able to express need for assistance with ADLs?: No Does the patient have difficulty dressing or bathing?: No Independently performs ADLs?: Yes (appropriate for developmental age) Does the patient have difficulty walking or climbing stairs?: No Weakness of Legs: None Weakness of Arms/Hands: None  Permission Sought/Granted Permission sought to share information with : Case Manager, Customer service manager, Family Supports Permission granted to share information with : Yes, Verbal Permission Granted  Share Information with NAME: Erline Levine  Permission granted to share info w AGENCY: [facility]        Emotional Assessment Appearance::  Appears stated age Attitude/Demeanor/Rapport: Gracious, Engaged Affect (typically observed): Accepting, Appropriate, Calm,  Hopeful Orientation: : Oriented to Self, Oriented to Place, Oriented to  Time, Oriented to Situation Alcohol / Substance Use: Not Applicable Psych Involvement: No (comment)  Admission diagnosis:  Septic bursitis [M71.10] Sepsis ([place]) [A41.9] Patient Active Problem List   Diagnosis Date Noted   Type 2 diabetes mellitus (A1c 6.0 on 05/03/2021) 05/16/2021   CKD (chronic kidney disease) stage 4, GFR 15-29 ml/min (HCC) 05/15/2021   Olecranon bursitis, right elbow 05/15/2021   Tremor 05/15/2021   Elbow pain, left 12/05/2020   Constipation 12/05/2020   Atherosclerosis of aorta ([place]) 12/05/2020   Chest pain, atypical 12/05/2020   CTS (carpal tunnel syndrome) 03/24/2020   Grief 03/24/2020   Cholelithiasis 09/16/2019   Nausea 09/02/2019   Weight loss 09/02/2019   Elevated troponin 01/12/2019   Hypertensive urgency 01/10/2019   Well adult exam 10/20/2018   Balanitis 10/20/2018   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/27/2017   Adenoma of stomach 12/25/2017   Abnormal findings on esophagogastroduodenoscopy (EGD) 12/25/2017   Sebaceous cyst 10/04/2017   Iron deficiency anemia    Benign neoplasm of sigmoid colon    Benign neoplasm of cecum    Benign neoplasm of transverse colon    Positive occult stool blood test 09/24/2017   CKD (chronic kidney disease), stage III (HCC) 09/24/2017   Chronic diastolic CHF (congestive heart failure) ([place]) 09/24/2017   Hypokalemia 08/17/2015   Generalized weakness 07/22/2015   Gait disorder 03/18/2014   Low back pain 01/12/2014   History of colon polyps 12/17/2013   DM (diabetes mellitus), type 2 with peripheral vascular complications ([place]) 26/37/8588   Diarrhea 10/08/2012   Edema 10/08/2011   DOE (dyspnea on exertion) 06/29/2011   LATERAL EPICONDYLITIS, LEFT 02/20/2010   TOBACCO USE, QUIT 02/23/2009   Cough 02/16/2009   DECREASED HEARING 10/15/2008   ERECTILE DYSFUNCTION 04/30/2008   CAD (coronary artery disease) 04/30/2008   COPD  (chronic obstructive pulmonary disease) ([place]) 05/21/2007   Anxiety state 05/20/2007   Insomnia 03/04/2007   Dyslipidemia 02/01/2007   Gout 02/01/2007   Essential hypertension 02/01/2007   GERD 02/01/2007   Osteoarthritis 02/01/2007   PCP:  Cassandria Anger, MD Pharmacy:   [facility] ([place], [place] [address] [place] [address] [place] [address] Phone: [phone] Fax: [phone]  [facility] ([facility] ) - [place], [place] [place] [address] Phone: [phone] Fax: [phone]  [id number] - [place], [place] [address] [place] [address] Phone: [phone] Fax: [place], [place] - [address] [place] [address] Phone: [phone] Fax: [phone]     Social Determinants of Health (SDOH) Interventions    Readmission Risk Interventions Readmission Risk Prevention Plan 05/17/2021  Transportation Screening Complete  PCP or Specialist Appt within 3-5 Days Complete  HRI or Home Care Consult Complete  Social Work Consult for [place] Planning/Counseling Complete  Palliative Care Screening Not Applicable  Medication Review Press photographer) Complete  Some recent data might be hidden

## 2021-05-17 NOTE — Plan of Care (Signed)
°  Problem: Education: Goal: Knowledge of General Education information will improve Description: Including pain rating scale, medication(s)/side effects and non-pharmacologic comfort measures Outcome: Progressing   Problem: Clinical Measurements: Goal: Diagnostic test results will improve Outcome: Progressing   Problem: Activity: Goal: Risk for activity intolerance will decrease Outcome: Progressing   Problem: Nutrition: Goal: Adequate nutrition will be maintained Outcome: Progressing   

## 2021-05-17 NOTE — Progress Notes (Signed)
RUE venous duplex has been completed.   Results can be found under chart review under CV PROC. 05/17/2021 4:37 PM Nealie Mchatton RVT, RDMS

## 2021-05-17 NOTE — Progress Notes (Signed)
Initial Nutrition Assessment  DOCUMENTATION CODES:   Not applicable  INTERVENTION:  -Ensure Max po BID, each supplement provides 150 kcal and 30 grams of protein -MVI with minerals daily  NUTRITION DIAGNOSIS:   Increased nutrient needs related to chronic illness as evidenced by estimated needs  GOAL:   Patient will meet greater than or equal to 90% of their needs  MONITOR:   PO intake, Supplement acceptance, Labs, Weight trends, I & O's  REASON FOR ASSESSMENT:   Malnutrition Screening Tool    ASSESSMENT:   Pt with PMH significant of COPD, CAD s/p CABG, HTN, type 2 DM, CKD stage IV, anemia, CHF, IgG kappa monoclonal paraproteinemia admitted with R elbow septic bursitis and underwent aspiration  Pt reports no issues/changes to appetite, but does report increasing weakness/fatigue recently. Pt also reports weight loss. Reviewed weight history (shown below) which shows pt's wt has varied 75.9 kg - 83.1 kg over the last year (note 75.9 kg is admit weight and 83.1 kg is from 05/11/20). Current weight is lowest wt on file. Suspect pt with some degree of weight loss, though true amount is difficult to quantify as pt's weight/weight history is complicated by changes in fluid status.   PO Intake: 100% x 1 recorded meal    UOP: 120ml x24 hours I/O: +1232ml since admit  Non-pitting edema to BLE and RUE per RN assessment  Medications: SSI TID w/ meals and bedtime, protonix, deltasone Labs: Recent Labs  Lab 05/15/21 1747 05/16/21 0739 05/17/21 0332  NA 139 141 141  K 3.2* 3.4* 4.0  CL 101 103 103  CO2 27 27 23   BUN 65* 62* 56*  CREATININE 3.55* 3.33* 2.88*  CALCIUM 9.2 9.2 9.3  MG  --  2.0 2.1  GLUCOSE 150* 146* 146*  CBGs: 126-160 x24 hours    NUTRITION - FOCUSED PHYSICAL EXAM: Unable to perform at this time. Will attempt at follow-up.   Diet Order:   Diet Order             Diet heart healthy/carb modified Room service appropriate? Yes; Fluid consistency: Thin   Diet effective now                   EDUCATION NEEDS:   No education needs have been identified at this time  Skin:  Skin Assessment: Reviewed RN Assessment  Last BM:  PTA  Height:   Ht Readings from Last 1 Encounters:  05/17/21 6\' 1"  (1.854 m)    Weight:   Wt Readings from Last 1 Encounters:  05/17/21 75.9 kg     BMI:  Body mass index is 22.08 kg/m.  Estimated Nutritional Needs:   Kcal:  1900-2100  Protein:  95-105 grams  Fluid:  >1.9L     Theone Stanley., MS, RD, LDN (she/her/hers) RD pager number and weekend/on-call pager number located in [place].

## 2021-05-17 NOTE — Plan of Care (Signed)
°  Problem: Clinical Measurements: Goal: Ability to maintain clinical measurements within normal limits will improve Outcome: Progressing Goal: Will remain free from infection Outcome: Progressing Goal: Diagnostic test results will improve Outcome: Progressing Goal: Respiratory complications will improve Outcome: Progressing Goal: Cardiovascular complication will be avoided Outcome: Progressing   Problem: Coping: Goal: Level of anxiety will decrease Outcome: Progressing   Problem: Safety: Goal: Ability to remain free from injury will improve Outcome: Progressing   Problem: Pain Managment: Goal: General experience of comfort will improve Outcome: Progressing

## 2021-05-18 ENCOUNTER — Other Ambulatory Visit (HOSPITAL_COMMUNITY): Payer: Self-pay

## 2021-05-18 DIAGNOSIS — I2510 Atherosclerotic heart disease of native coronary artery without angina pectoris: Secondary | ICD-10-CM

## 2021-05-18 LAB — BASIC METABOLIC PANEL
Anion gap: 11 (ref 5–15)
BUN: 72 mg/dL — ABNORMAL HIGH (ref 8–23)
CO2: 25 mmol/L (ref 22–32)
Calcium: 9.2 mg/dL (ref 8.9–10.3)
Chloride: 102 mmol/L (ref 98–111)
Creatinine, Ser: 3.04 mg/dL — ABNORMAL HIGH (ref 0.61–1.24)
GFR, Estimated: 20 mL/min — ABNORMAL LOW (ref >60)
Glucose, Bld: 165 mg/dL — ABNORMAL HIGH (ref 70–99)
Potassium: 4.1 mmol/L (ref 3.5–5.1)
Sodium: 138 mmol/L (ref 135–145)

## 2021-05-18 LAB — GLUCOSE, CAPILLARY
Glucose-Capillary: 147 mg/dL — ABNORMAL HIGH (ref 70–99)
Glucose-Capillary: 134 mg/dL — ABNORMAL HIGH (ref 70–99)

## 2021-05-18 MED ORDER — PREDNISONE 10 MG PO TABS
ORAL_TABLET | ORAL | 0 refills | Status: DC
  Filled 2021-05-18: qty 10, 4d supply, fill #0

## 2021-05-18 NOTE — TOC Transition Note (Signed)
Transition of Care [facility]) - CM/SW Discharge Note   Patient Details  Name: Harold Pittman MRN: [id number] Date of Birth: 1940-10-12  Transition of Care [facility]) CM/SW Contact:  Tom-Johnson, Renea Ee, RN Phone Number: 05/18/2021, 1:18 PM   Clinical Narrative:     Patient is scheduled for discharge today. HHPT/OT referral with [facility] and information on [facility]. Walker delivered by [employer] at bedside. Denies any other needs. Daughter to transport at discharge. No further TOC needs noted.  Final next level of care: [place] Barriers to Discharge: Barriers Resolved   Patient Goals and CMS Choice Patient states their goals for this hospitalization and ongoing recovery are:: To return home CMS Medicare.gov Compare Post Acute Care list provided to:: Patient Choice offered to / list presented to : Patient, Adult Children (Daughter, Harold Cornelia.)  Discharge Placement                Patient to be transferred to facility by: Daughter (Home)      Discharge Plan and Services   Discharge Planning Services: CM Consult Post Acute Care Choice: Home Health          DME Arranged: Walker rolling DME Agency: [facility] Date DME Agency Contacted: 05/17/21 Time DME Agency Contacted: 1120 Representative spoke with at DME Agency: Freda Munro HH Arranged: PT, OT [facility] Agency: [place] Date [place]: 05/17/21 Time [place]: 1115 Representative spoke with at [place]: [place] ([place]) Interventions     Readmission Risk Interventions Readmission Risk Prevention Plan 05/17/2021  Transportation Screening Complete  PCP or Specialist Appt within 3-5 Days Complete  HRI or [place] Complete  Social Work Consult for [place] Planning/Counseling [place] Screening Not Applicable  Medication Review Press photographer) Complete  Some recent data might be hidden

## 2021-05-18 NOTE — Discharge Summary (Signed)
PATIENT DETAILS Name: Harold Pittman Age: 81 y.o. Sex: male Date of Birth: 12/01/1940 MRN: [id number]. Admitting Physician: Shela Leff, MD [id number], Evie Lacks, MD  Admit Date: 05/15/2021 Discharge date: 05/18/2021  Recommendations for Outpatient Follow-up:  Follow up with PCP in 1-2 weeks Please obtain CMP/CBC in one week Please ensure follow-up with nephrology Please follow synovial fluid/blood cultures until final. ARB on hold   Admitted From:  Home  Disposition: Home health   Discharge Condition: good  CODE STATUS:   Code Status: Full Code   Diet recommendation:  Diet Order             Diet - low sodium heart healthy           Diet Carb Modified           Diet heart healthy/carb modified Room service appropriate? Yes; Fluid consistency: Thin  Diet effective now                    Brief Summary: Patient is a 81 y.o.  male history of DM-2, HTN,Gout, COPD, CAD s/p CABG, IgG kappa monoclonal paraproteinemia-who presented with weakness, AKI on CKD stage IV and right olecranon bursitis.  Significant Hospital events: 2/06>> admit to [facility] for AKI/right olecranon bursitis.  Significant imaging studies: 2/06>> CXR: No pneumonia 2/08>> RUE Doppler: No DVT  Significant microbiology data: 2/06>> COVID/influenza PCR: Negative 2/06>> blood culture: negative 2/06>> synovial fluid culture: negative (WBC 3900, intracellular monosodium urate crystals)  Procedures: 2/07>>Arthrocentesis right elbow-by ED  Consults:  None  Brief Hospital Course: Olecranon bursitis, right elbow- (present on admission) Likely due to gout-synovial fluid cultures negative for infection-crystals were seen on analysis.  Remarkable improvement after starting empiric steroids-he is now able to move the elbow quite a lot more than when he first presented to the ED.  He was also on empiric antibiotics but since cultures are negative-we will stop empiric antimicrobial therapy on  discharge.  Right upper extremity Doppler was negative for DVT.  Both pain and swelling have significantly decreased.  He is stable for discharge on tapering steroids.  PCP to consider outpatient referral to rheumatology.  CKD (chronic kidney disease) stage 4, GFR 15-29 ml/min (HCC)- (present on admission) Has progressive worsening renal function over the past 1 year-apparently is taking double dosing of torsemide that he is supposed to be on.  Resume torsemide 50 mg on discharge (he apparently was taken 100 mg prior).  Continue to hold ARB.  Follow with nephrology/PCP.  Generalized weakness Due to acute illness-advanced age/frailty-appreciate PT/OT eval-will arrange for home health services prior to discharge.  Gout- (present on admission) Per patient-he has a known history of gout-review of medications reveals that he is only on colchicine as needed.  See above-continue tapering prednisone on discharge.  Chronic diastolic CHF (congestive heart failure) ([place])- (present on admission) Euvolemic-resume torsemide on discharge.  Essential hypertension- (present on admission) BP stable on amlodipine, Coreg and hydralazine.  Resuming torsemide-since BP stable-We will continue to hold irbesartan for now.  PCP to reassess at next visit.    Type 2 diabetes mellitus (A1c 6.0 on 05/03/2021) with steroid-induced hyperglycemia Diet controlled at home.  CBG stable with SSI  Recent Labs    05/17/21 1716 05/17/21 2028 05/18/21 0754  GLUCAP 222* 198* 147*     CAD (coronary artery disease)- (present on admission) No anginal symptoms-continue aspirin, Coreg and statin.  Troponins minimally elevated and of no clinical significance.   Hypokalemia- (present on admission) Repleted-recheck periodically.  Nutrition Status: Nutrition Problem: Increased nutrient needs Etiology: chronic illness Signs/Symptoms: estimated needs Interventions: MVI, Premier Protein   BMI: Estimated body mass index is 22.08  kg/m as calculated from the following:   Height as of this encounter: _0  (1.854 m).   Weight as of this encounter: 75.9 kg.     Discharge Diagnoses:  Active Problems:   Olecranon bursitis, right elbow   CKD (chronic kidney disease) stage 4, GFR 15-29 ml/min (HCC)   Gout   Generalized weakness   Chronic diastolic CHF (congestive heart failure) (HCC)   Essential hypertension   Type 2 diabetes mellitus (A1c 6.0 on 05/03/2021) with steroid-induced hyperglycemia   CAD (coronary artery disease)   Hypokalemia   Discharge Instructions:  Activity:  As tolerated    Discharge Instructions     Call MD for:  redness, tenderness, or signs of infection (pain, swelling, redness, odor or green/yellow discharge around incision site)   Complete by: As directed    Diet - low sodium heart healthy   Complete by: As directed    Diet Carb Modified   Complete by: As directed    Discharge instructions   Complete by: As directed    Follow with Primary MD  Plotnikov, Evie Lacks, MD in 1-2 weeks  Please get a complete blood count and chemistry panel checked by your Primary MD at your next visit, and again as instructed by your Primary MD.  Get Medicines reviewed and adjusted: Please take all your medications with you for your next visit with your Primary MD  Laboratory/radiological data: Please request your Primary MD to go over all hospital tests and procedure/radiological results at the follow up, please ask your Primary MD to get all Hospital records sent to his/her office.  In some cases, they will be blood work, cultures and biopsy results pending at the time of your discharge. Please request that your primary care M.D. follows up on these results.  Also Note the following: If you experience worsening of your admission symptoms, develop shortness of breath, life threatening emergency, suicidal or homicidal thoughts you must seek medical attention immediately by calling 911 or calling your  MD immediately  if symptoms less severe.  You must read complete instructions/literature along with all the possible adverse reactions/side effects for all the Medicines you take and that have been prescribed to you. Take any new Medicines after you have completely understood and accpet all the possible adverse reactions/side effects.   Do not drive when taking Pain medications or sleeping medications (Benzodaizepines)  Do not take more than prescribed Pain, Sleep and Anxiety Medications. It is not advisable to combine anxiety,sleep and pain medications without talking with your primary care practitioner  Special Instructions: If you have smoked or chewed Tobacco  in the last 2 yrs please stop smoking, stop any regular Alcohol  and or any Recreational drug use.  Wear Seat belts while driving.  Please note: You were cared for by a hospitalist during your hospital stay. Once you are discharged, your primary care physician will handle any further medical issues. Please note that NO REFILLS for any discharge medications will be authorized once you are discharged, as it is imperative that you return to your primary care physician (or establish a relationship with a primary care physician if you do not have one) for your post hospital discharge needs so that they can reassess your need for medications and monitor your lab values.   Increase activity slowly   Complete by:  As directed       Allergies as of 05/18/2021       Reactions   Benazepril Shortness Of Breath   Cough, wheezing        Medication List     STOP taking these medications    cyclobenzaprine 10 MG tablet Commonly known as: FLEXERIL   irbesartan 150 MG tablet Commonly known as: AVAPRO       TAKE these medications    amLODipine 10 MG tablet Commonly known as: NORVASC Take 1 tablet (10 mg total) by mouth daily.   aspirin 81 MG EC tablet Take 1 tablet (81 mg total) by mouth daily.   carvedilol 25 MG  tablet Commonly known as: COREG TAKE 1 TABLET BY MOUTH  TWICE DAILY WITH A MEAL   colchicine 0.6 MG tablet Take 1 tablet (0.6 mg total) by mouth 2 (two) times daily as needed. What changed: reasons to take this   docusate sodium 100 MG capsule Commonly known as: Colace Take 1 capsule (100 mg total) by mouth 2 (two) times daily. What changed:  when to take this reasons to take this   famotidine 40 MG tablet Commonly known as: PEPCID Take 1 tablet (40 mg total) by mouth daily.   ferrous sulfate 325 (65 FE) MG tablet Take 1 tablet (325 mg total) by mouth daily.   gabapentin 300 MG capsule Commonly known as: NEURONTIN Take 300 mg by mouth 3 (three) times daily.   hydrALAZINE 50 MG tablet Commonly known as: APRESOLINE TAKE 1 TABLET BY MOUTH 3  TIMES DAILY   HYDROcodone-acetaminophen 5-325 MG tablet Commonly known as: Norco Take 1 tablet by mouth every 6 (six) hours as needed for moderate pain.   omeprazole 40 MG capsule Commonly known as: PRILOSEC TAKE 1 CAPSULE BY MOUTH  DAILY   ondansetron 4 MG tablet Commonly known as: Zofran Take 1 tablet (4 mg total) by mouth every 8 (eight) hours as needed for nausea or vomiting.   OneTouch Delica Plus [id number] Misc USE AS DIRECTED   OneTouch Verio Flex System w/Device Kit USE TO MONITOR BLOOD SUGAR   OneTouch Verio test strip Generic drug: glucose blood CHECK BLOOD SUGAR 1 TO 2  TIMES DAILY AS DIRECTED   potassium chloride 10 MEQ tablet Commonly known as: KLOR-CON M TAKE 1 TABLET(10 MEQ) BY MOUTH DAILY   pravastatin 80 MG tablet Commonly known as: PRAVACHOL TAKE 1 TABLET BY MOUTH AT  BEDTIME   predniSONE 10 MG tablet Commonly known as: DELTASONE Take 40 mg daily for 1 day, 30 mg daily for 1 day, 20 mg daily for 1 days,10 mg daily for 1 day, then stop   tadalafil 20 MG tablet Commonly known as: CIALIS Take 20 mg by mouth as needed for erectile dysfunction.   torsemide 100 MG tablet Commonly known as:  DEMADEX Take 0.5 tablets (50 mg total) by mouth daily. What changed: how much to take   Vitamin B-12 1000 MCG Subl Place 1 tablet (1,000 mcg total) under the tongue daily.   Vitamin D3 50 MCG (2000 UT) capsule Take 1 capsule (2,000 Units total) by mouth daily.               Durable Medical Equipment  (From admission, onward)           Start     Ordered   05/17/21 1138  For home use only DME Walker rolling  Once       Question Answer Comment  Walker: With 5  Inch Wheels   Patient needs a walker to treat with the following condition Gait instability      05/17/21 1137            Follow-up Information     Plotnikov, Evie Lacks, MD. Schedule an appointment as soon as possible for a visit in 1 week(s).   Specialty: Internal Medicine Contact information: [place] [address] [phone]         Sherren Mocha, MD Follow up.   Specialty: Cardiology Why: As needed Contact information: [address] [place] [address] [phone]                Allergies  Allergen Reactions   Benazepril Shortness Of Breath    Cough, wheezing     Other Procedures/Studies: DG Chest 2 View  Result Date: 05/15/2021 CLINICAL DATA:  Fever. EXAM: CHEST - 2 VIEW COMPARISON:  Chest x-ray 11/29/2020. FINDINGS: Sternotomy wires are present. The aorta is tortuous. Cardiac silhouette is within normal limits. Lungs are clear. No pleural effusion or pneumothorax. No acute fractures. IMPRESSION: 1. No acute cardiopulmonary process. Electronically Signed   By: Ronney Asters M.D.   On: 05/15/2021 18:04   VAS [place] UPPER EXTREMITY VENOUS DUPLEX  Result Date: 05/17/2021 UPPER VENOUS STUDY  Patient Name:  Harold Pittman  Date of Exam:   05/17/2021 Medical Rec #: [id number]        Accession #:    [id number] Date of Birth: [birth year]         Patient Gender: M Patient Age:   17 years Exam Location:  [facility] Procedure:      VAS [place] UPPER  EXTREMITY VENOUS DUPLEX Referring Phys: Oren Binet --------------------------------------------------------------------------------  Indications: Swelling, and bursitis - RUE elbow Limitations: Poor ultrasound/tissue interface. Comparison Study: No previous exams Performing Technologist: Jody Hill RVT, RDMS  Examination Guidelines: A complete evaluation includes B-mode imaging, spectral Doppler, color Doppler, and power Doppler as needed of all accessible portions of each vessel. Bilateral testing is considered an integral part of a complete examination. Limited examinations for reoccurring indications may be performed as noted.  Right Findings: +----------+------------+---------+-----------+----------+-------------------+  RIGHT      Compressible Phasicity Spontaneous Properties       Summary        +----------+------------+---------+-----------+----------+-------------------+  IJV            Full        Yes        Yes                                     +----------+------------+---------+-----------+----------+-------------------+  Subclavian     Full        Yes        Yes                                     +----------+------------+---------+-----------+----------+-------------------+  Axillary       Full        Yes        Yes                                     +----------+------------+---------+-----------+----------+-------------------+  Brachial       Full  Yes        Yes                                     +----------+------------+---------+-----------+----------+-------------------+  Radial         Full                                      Not well visualized  +----------+------------+---------+-----------+----------+-------------------+  Ulnar          Full                                      Not well visualized  +----------+------------+---------+-----------+----------+-------------------+  Cephalic       Full                                                            +----------+------------+---------+-----------+----------+-------------------+  Basilic        Full        Yes        Yes                                     +----------+------------+---------+-----------+----------+-------------------+ Only one brachial vein seen on this exam.  Summary:  Right: No evidence of deep vein thrombosis in the upper extremity. No evidence of superficial vein thrombosis in the upper extremity.  Left: No evidence of thrombosis in the subclavian.  *See table(s) above for measurements and observations.  Diagnosing physician: Harold Barban MD Electronically signed by Harold Barban MD on 05/17/2021 at 8:55:35 PM.    Final      TODAY-DAY OF DISCHARGE:  Subjective:   Harold Pittman today has no headache,no chest abdominal pain,no new weakness tingling or numbness, feels much better wants to go home today.   Objective:   Blood pressure (!) 105/59, pulse (!) 58, temperature 97.6 F (36.4 C), temperature source Oral, resp. rate 18, height _0  (1.854 m), weight 75.9 kg, SpO2 100 %.  Intake/Output Summary (Last 24 hours) at 05/18/2021 0910 Last data filed at 05/18/2021 0600 Gross per 24 hour  Intake 600 ml  Output 250 ml  Net 350 ml   Filed Weights   05/15/21 1739 05/17/21 0041  Weight: 80 kg 75.9 kg    Exam: Awake Alert, Oriented *3, No new F.N deficits, Normal affect [place].AT,PERRAL Supple Neck,No JVD, No cervical lymphadenopathy appriciated.  Symmetrical Chest wall movement, Good air movement bilaterally, CTAB RRR,No Gallops,Rubs or new Murmurs, No Parasternal Heave +ve B.Sounds, Abd Soft, Non tender, No organomegaly appriciated, No rebound -guarding or rigidity. No Cyanosis, Clubbing or edema, No new Rash or bruise   PERTINENT RADIOLOGIC STUDIES: VAS [place] UPPER EXTREMITY VENOUS DUPLEX  Result Date: 05/17/2021 UPPER VENOUS STUDY  Patient Name:  Harold Pittman  Date of Exam:   05/17/2021 Medical Rec #: [id number]        Accession #:    [id number] Date of Birth: 08/28/1940          Patient Gender: M Patient  Age:   63 years Exam Location:  [facility] Procedure:      VAS [place] UPPER EXTREMITY VENOUS DUPLEX Referring Phys: Evalee Mutton Alilah Mcmeans --------------------------------------------------------------------------------  Indications: Swelling, and bursitis - RUE elbow Limitations: Poor ultrasound/tissue interface. Comparison Study: No previous exams Performing Technologist: Jody Hill RVT, RDMS  Examination Guidelines: A complete evaluation includes B-mode imaging, spectral Doppler, color Doppler, and power Doppler as needed of all accessible portions of each vessel. Bilateral testing is considered an integral part of a complete examination. Limited examinations for reoccurring indications may be performed as noted.  Right Findings: +----------+------------+---------+-----------+----------+-------------------+  RIGHT      Compressible Phasicity Spontaneous Properties       Summary        +----------+------------+---------+-----------+----------+-------------------+  IJV            Full        Yes        Yes                                     +----------+------------+---------+-----------+----------+-------------------+  Subclavian     Full        Yes        Yes                                     +----------+------------+---------+-----------+----------+-------------------+  Axillary       Full        Yes        Yes                                     +----------+------------+---------+-----------+----------+-------------------+  Brachial       Full        Yes        Yes                                     +----------+------------+---------+-----------+----------+-------------------+  Radial         Full                                      Not well visualized  +----------+------------+---------+-----------+----------+-------------------+  Ulnar          Full                                      Not well visualized   +----------+------------+---------+-----------+----------+-------------------+  Cephalic       Full                                                           +----------+------------+---------+-----------+----------+-------------------+  Basilic        Full        Yes        Yes                                     +----------+------------+---------+-----------+----------+-------------------+  Only one brachial vein seen on this exam.  Summary:  Right: No evidence of deep vein thrombosis in the upper extremity. No evidence of superficial vein thrombosis in the upper extremity.  Left: No evidence of thrombosis in the subclavian.  *See table(s) above for measurements and observations.  Diagnosing physician: Harold Barban MD Electronically signed by Harold Barban MD on 05/17/2021 at 8:55:35 PM.    Final      PERTINENT LAB RESULTS: CBC: Recent Labs    05/16/21 0739 05/17/21 0332  WBC 10.2 12.9*  HGB 10.1* 9.2*  HCT 30.7* 28.1*  PLT 208 202   CMET CMP     Component Value Date/Time   NA 138 05/18/2021 0719   NA 143 01/02/2021 1034   K 4.1 05/18/2021 0719   CL 102 05/18/2021 0719   CO2 25 05/18/2021 0719   GLUCOSE 165 (H) 05/18/2021 0719   GLUCOSE 142 (H) 01/28/2006 0841   BUN 72 (H) 05/18/2021 0719   BUN 49 (H) 01/02/2021 1034   CREATININE 3.04 (H) 05/18/2021 0719   CREATININE 3.14 (HH) 03/13/2021 1232   CREATININE 1.76 (H) 11/18/2019 0831   CALCIUM 9.2 05/18/2021 0719   PROT 7.2 05/03/2021 0952   ALBUMIN 4.3 05/03/2021 0952   AST 26 05/03/2021 0952   AST 26 03/13/2021 1232   ALT 20 05/03/2021 0952   ALT 19 03/13/2021 1232   ALKPHOS 98 05/03/2021 0952   BILITOT 0.6 05/03/2021 0952   BILITOT 0.7 03/13/2021 1232   GFRNONAA 20 (L) 05/18/2021 0719   GFRNONAA 19 (L) 03/13/2021 1232   GFRAA 40 (L) 01/12/2019 0747    GFR Estimated Creatinine Clearance: 20.8 mL/min (A) (by C-G formula based on SCr of 3.04 mg/dL (H)). No results for input(s): LIPASE, AMYLASE in the last 72 hours. Recent  Labs    05/15/21 2215  CKTOTAL 138   Invalid input(s): POCBNP No results for input(s): DDIMER in the last 72 hours. No results for input(s): HGBA1C in the last 72 hours. No results for input(s): CHOL, HDL, LDLCALC, TRIG, CHOLHDL, LDLDIRECT in the last 72 hours. No results for input(s): TSH, T4TOTAL, T3FREE, THYROIDAB in the last 72 hours.  Invalid input(s): FREET3 No results for input(s): VITAMINB12, FOLATE, FERRITIN, TIBC, IRON, RETICCTPCT in the last 72 hours. Coags: No results for input(s): INR in the last 72 hours.  Invalid input(s): PT Microbiology: Recent Results (from the past 240 hour(s))  Resp Panel by RT-PCR (Flu A&B, Covid) Nasopharyngeal Swab     Status: None   Collection Time: 05/15/21  8:47 PM   Specimen: Nasopharyngeal Swab; Nasopharyngeal(NP) swabs in vial transport medium  Result Value Ref Range Status   SARS Coronavirus 2 by RT PCR NEGATIVE NEGATIVE Final    Comment: (NOTE) SARS-CoV-2 target nucleic acids are NOT DETECTED.  The SARS-CoV-2 RNA is generally detectable in upper respiratory specimens during the acute phase of infection. The lowest concentration of SARS-CoV-2 viral copies this assay can detect is 138 copies/mL. A negative result does not preclude SARS-Cov-2 infection and should not be used as the sole basis for treatment or other patient management decisions. A negative result may occur with  improper specimen collection/handling, submission of specimen other than nasopharyngeal swab, presence of viral mutation(s) within the areas targeted by this assay, and inadequate number of viral copies(<138 copies/mL). A negative result must be combined with clinical observations, patient history, and epidemiological information. The expected result is Negative.  Fact Sheet for Patients:  EntrepreneurPulse.com.au  Fact Sheet for Healthcare Providers:  IncredibleEmployment.be  This test is no t yet approved or cleared  by the [place] and  has been authorized for detection and/or diagnosis of SARS-CoV-2 by FDA under an Emergency Use Authorization (EUA). This EUA will remain  in effect (meaning this test can be used) for the duration of the COVID-19 declaration under Section 564(b)(1) of the Act, 21 U.S.C.section 360bbb-3(b)(1), unless the authorization is terminated  or revoked sooner.       Influenza A by PCR NEGATIVE NEGATIVE Final   Influenza B by PCR NEGATIVE NEGATIVE Final    Comment: (NOTE) The Xpert Xpress SARS-CoV-2/FLU/RSV plus assay is intended as an aid in the diagnosis of influenza from Nasopharyngeal swab specimens and should not be used as a sole basis for treatment. Nasal washings and aspirates are unacceptable for Xpert Xpress SARS-CoV-2/FLU/RSV testing.  Fact Sheet for Patients: EntrepreneurPulse.com.au  Fact Sheet for Healthcare Providers: IncredibleEmployment.be  This test is not yet approved or cleared by the [place] FDA and has been authorized for detection and/or diagnosis of SARS-CoV-2 by FDA under an Emergency Use Authorization (EUA). This EUA will remain in effect (meaning this test can be used) for the duration of the COVID-19 declaration under Section 564(b)(1) of the Act, 21 U.S.C. section 360bbb-3(b)(1), unless the authorization is terminated or revoked.  Performed at [facility], [place] [address] [place], [place] [address]   Urine Culture     Status: Abnormal   Collection Time: 05/15/21 10:21 PM   Specimen: Urine, Random  Result Value Ref Range Status   Specimen Description URINE, RANDOM  Final   Special Requests   Final    NONE Performed at [facility], [place] [address], [place], [place] [address]    Culture MULTIPLE SPECIES PRESENT, SUGGEST RECOLLECTION (A)  Final   Report Status 05/17/2021 FINAL  Final  Body fluid culture w Gram Stain     Status: None (Preliminary result)   Collection Time:  05/15/21 11:45 PM   Specimen: Body Fluid  Result Value Ref Range Status   Specimen Description FLUID SYNOVIAL  Final   Special Requests BURSA CYST  Final   Gram Stain   Final    FEW WBC PRESENT,BOTH PMN AND MONONUCLEAR NO ORGANISMS SEEN    Culture   Final    NO GROWTH 2 DAYS Performed at [facility], [address], [place], [place] [address]    Report Status PENDING  Incomplete  Blood culture (routine x 2)     Status: None (Preliminary result)   Collection Time: 05/16/21 12:08 AM   Specimen: BLOOD RIGHT ARM  Result Value Ref Range Status   Specimen Description BLOOD RIGHT ARM  Final   Special Requests   Final    BOTTLES DRAWN AEROBIC AND ANAEROBIC Blood Culture results may not be optimal due to an excessive volume of blood received in culture bottles   Culture   Final    NO GROWTH 1 DAY Performed at [facility], [place] [address] [place], [place] [address]    Report Status PENDING  Incomplete  Blood culture (routine x 2)     Status: None (Preliminary result)   Collection Time: 05/16/21 12:09 AM   Specimen: BLOOD LEFT ARM  Result Value Ref Range Status   Specimen Description BLOOD LEFT ARM  Final   Special Requests   Final    BOTTLES DRAWN AEROBIC AND ANAEROBIC Blood Culture adequate volume   Culture   Final    NO  GROWTH 1 DAY Performed at [facility], [place] [address], [place], [place] [address]    Report Status PENDING  Incomplete    FURTHER DISCHARGE INSTRUCTIONS:  Get Medicines reviewed and adjusted: Please take all your medications with you for your next visit with your Primary MD  Laboratory/radiological data: Please request your Primary MD to go over all hospital tests and procedure/radiological results at the follow up, please ask your Primary MD to get all Hospital records sent to his/her office.  In some cases, they will be blood work, cultures and biopsy results pending at the time of your discharge. Please request that your primary care  M.D. goes through all the records of your hospital data and follows up on these results.  Also Note the following: If you experience worsening of your admission symptoms, develop shortness of breath, life threatening emergency, suicidal or homicidal thoughts you must seek medical attention immediately by calling 911 or calling your MD immediately  if symptoms less severe.  You must read complete instructions/literature along with all the possible adverse reactions/side effects for all the Medicines you take and that have been prescribed to you. Take any new Medicines after you have completely understood and accpet all the possible adverse reactions/side effects.   Do not drive when taking Pain medications or sleeping medications (Benzodaizepines)  Do not take more than prescribed Pain, Sleep and Anxiety Medications. It is not advisable to combine anxiety,sleep and pain medications without talking with your primary care practitioner  Special Instructions: If you have smoked or chewed Tobacco  in the last 2 yrs please stop smoking, stop any regular Alcohol  and or any Recreational drug use.  Wear Seat belts while driving.  Please note: You were cared for by a hospitalist during your hospital stay. Once you are discharged, your primary care physician will handle any further medical issues. Please note that NO REFILLS for any discharge medications will be authorized once you are discharged, as it is imperative that you return to your primary care physician (or establish a relationship with a primary care physician if you do not have one) for your post hospital discharge needs so that they can reassess your need for medications and monitor your lab values.  Total Time spent coordinating discharge including counseling, education and face to face time equals greater than 30 minutes.  Signed: Elishia Kaczorowski 05/18/2021 9:10 AM

## 2021-05-18 NOTE — Care Management Important Message (Signed)
Important Message  Patient Details  Name: GATSBY CHISMAR MRN: [id number] Date of Birth: [birth year]   Medicare Important Message Given:  Yes  Patient discharged prior to IM delivery. Will mail to the patient home address.    Blanchie Zeleznik 05/18/2021, 2:42 PM

## 2021-05-18 NOTE — Progress Notes (Signed)
Patient given discharge instructions and stated understanding. 

## 2021-05-18 NOTE — Plan of Care (Signed)
°  Problem: Education: Goal: Knowledge of General Education information will improve Description: Including pain rating scale, medication(s)/side effects and non-pharmacologic comfort measures Outcome: Adequate for Discharge   Problem: Health Behavior/Discharge Planning: Goal: Ability to manage health-related needs will improve Outcome: Adequate for Discharge   Problem: Clinical Measurements: Goal: Ability to maintain clinical measurements within normal limits will improve Outcome: Adequate for Discharge Goal: Will remain free from infection Outcome: Adequate for Discharge Goal: Diagnostic test results will improve Outcome: Adequate for Discharge Goal: Respiratory complications will improve Outcome: Adequate for Discharge Goal: Cardiovascular complication will be avoided Outcome: Adequate for Discharge   Problem: Activity: Goal: Risk for activity intolerance will decrease Outcome: Adequate for Discharge   Problem: Nutrition: Goal: Adequate nutrition will be maintained Outcome: Adequate for Discharge   Problem: Coping: Goal: Level of anxiety will decrease Outcome: Adequate for Discharge   Problem: Elimination: Goal: Will not experience complications related to bowel motility Outcome: Adequate for Discharge Goal: Will not experience complications related to urinary retention Outcome: Adequate for Discharge   Problem: Pain Managment: Goal: General experience of comfort will improve Outcome: Adequate for Discharge   Problem: Safety: Goal: Ability to remain free from injury will improve Outcome: Adequate for Discharge   Problem: Skin Integrity: Goal: Risk for impaired skin integrity will decrease Outcome: Adequate for Discharge   Problem: Acute Rehab PT Goals(only PT should resolve) Goal: Pt Will Go Supine/Side To Sit Outcome: Adequate for Discharge Goal: Patient Will Transfer Sit To/From Stand Outcome: Adequate for Discharge Goal: Pt Will Ambulate Outcome: Adequate  for Discharge Goal: Pt/caregiver will Perform Home Exercise Program Outcome: Adequate for Discharge   Problem: Acute Rehab OT Goals (only OT should resolve) Goal: Pt. Will Perform Eating Outcome: Adequate for Discharge Goal: Pt. Will Perform Grooming Outcome: Adequate for Discharge Goal: Pt. Will Perform Upper Body Dressing Outcome: Adequate for Discharge Goal: Pt. Will Perform Lower Body Dressing Outcome: Adequate for Discharge Goal: Pt. Will Transfer To Toilet Outcome: Adequate for Discharge Goal: Pt. Will Perform Toileting-Clothing Manipulation Outcome: Adequate for Discharge Goal: OT Additional ADL Goal #1 Outcome: Adequate for Discharge   Problem: Increased Nutrient Needs (NI-5.1) Goal: Food and/or nutrient delivery Description: Individualized approach for food/nutrient provision. Outcome: Adequate for Discharge

## 2021-05-19 LAB — BODY FLUID CULTURE W GRAM STAIN: Culture: NO GROWTH

## 2021-05-19 NOTE — Progress Notes (Signed)
Assessment/Plan:   1.  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  Told patient to talk about this to primary care when he sees physician tomorrow.  -Discussed with patient and son that sometimes myoclonus can happen in renal patients, even without medication inducing it.  However, I would first taper the dose of gabapentin and see if it resolves.  -Reassured patient I saw no evidence of Parkinson's disease today.  2.  Gait instability  -This is likely due to diabetic neuropathy.  Discussed nature and pathophysiology.  -Patient has just started physical therapy.  -Patient has just started using a walker.  I would recommend this at all times.  -Safety discussed.  3.  F/u prn  Subjective:   Harold Pittman was seen today in the movement disorders clinic for neurologic consultation at the request of Biagio Borg, MD.  The consultation is for the evaluation of tremor and gait change.  Son with patient who supplements hx.   Seen by primary care for this on February 6 as well as elbow pain.  He was actually admitted to the hospital that same night for right olecranon bursitis, likely due to gout.  Patient also was having worsening renal function, likely due to the fact that he was taking double the amount of torsemide that he was supposed to be on.  Patient was discharged from the hospital on February 9.  Tremor: Yes.     How long has it been going on? 3+ months - its more of a jerking than a tremor.  If holding a cup in the hand when it happened, it would drop.  None in the legs or head.    At rest or with activation?  rest  Fam hx of tremor?  Yes.    Located where?  bilateral   Other Specific Symptoms:  Voice: no change Postural symptoms:  Yes.   - 9 months to 1 year  Falls?  None in last year; just started using walker this hospital stay.  He is diabetic since 1997.  No paresthesias of the feet.  He just  started PT/OT.   Bradykinesia symptoms: difficulty getting out of a chair, but this is better right now; drags the feet with ambulation Loss of smell:  No. Loss of taste:  No. Urinary Incontinence:  No. Difficulty Swallowing:  No. Handwriting, micrographia: Yes.   Trouble with ADL's:  No.  Trouble buttoning clothing: No. Depression:  No. Memory changes:  No.; manages own finances, pills, driving N/V:  No. Lightheaded:  No.  Syncope: No. Diplopia:  No. Dyskinesia:  No.    ALLERGIES:   Allergies  Allergen Reactions   Benazepril Shortness Of Breath    Cough, wheezing    CURRENT MEDICATIONS:  Current Outpatient Medications  Medication Instructions   amLODipine (NORVASC) 10 mg, Oral, Daily   aspirin 81 mg, Oral, Daily   Blood Glucose Monitoring Suppl (ONETOUCH VERIO FLEX SYSTEM) w/Device KIT USE TO MONITOR BLOOD SUGAR   carvedilol (COREG) 25 MG tablet TAKE 1 TABLET BY MOUTH  TWICE DAILY WITH A MEAL   colchicine 0.6 mg, Oral, 2 times daily PRN   docusate sodium (COLACE) 100 mg, Oral, 2 times daily   famotidine (PEPCID) 40 mg, Oral, Daily   ferrous sulfate 325 mg, Oral, Daily   gabapentin (NEURONTIN) 300 mg, Oral, 3 times daily   glucose blood (ONETOUCH VERIO) test strip CHECK BLOOD SUGAR 1 TO  2  TIMES DAILY AS DIRECTED   hydrALAZINE (APRESOLINE) 50 MG tablet TAKE 1 TABLET BY MOUTH 3  TIMES DAILY   HYDROcodone-acetaminophen (NORCO) 5-325 MG tablet 1 tablet, Oral, Every 6 hours PRN   Lancets (ONETOUCH DELICA PLUS [id number]) MISC USE AS DIRECTED   omeprazole (PRILOSEC) 40 MG capsule TAKE 1 CAPSULE BY MOUTH  DAILY   ondansetron (ZOFRAN) 4 mg, Oral, Every 8 hours PRN   potassium chloride (KLOR-CON) 10 MEQ tablet TAKE 1 TABLET(10 MEQ) BY MOUTH DAILY   pravastatin (PRAVACHOL) 80 MG tablet TAKE 1 TABLET BY MOUTH AT  BEDTIME   predniSONE (DELTASONE) 10 MG tablet Take 4 tablets by mouth daily for 1 day, 3 tablets daily for 1 day, 2 tablets daily for 1 days, then 1 tablet daily for 1  day, then stop   tadalafil (CIALIS) 20 mg, Oral, As needed   torsemide (DEMADEX) 100 MG tablet TAKE 1 TABLET BY MOUTH DAILY   Vitamin B-12 1,000 mcg, Sublingual, Daily   Vitamin D3 2,000 Units, Oral, Daily    Objective:   PHYSICAL EXAMINATION:    VITALS:   Vitals:   05/23/21 0939  BP: 124/69  Pulse: 76  SpO2: 98%  Weight: 170 lb 9.6 oz (77.4 kg)  Height: _0  (1.803 m)    GEN:  The patient appears stated age and is in NAD. HEENT:  Normocephalic, atraumatic.  The mucous membranes are moist. The superficial temporal arteries are without ropiness or tenderness. CV:  RRR Lungs:  CTAB Neck/HEME:  There are no carotid bruits bilaterally.  Neurological examination:  Orientation: The patient is alert and oriented x3.  Cranial nerves: There is good facial symmetry.  Extraocular muscles are intact. The visual fields are full to confrontational testing. The speech is fluent and clear. Soft palate rises symmetrically and there is no tongue deviation. Hearing is intact to conversational tone. Sensation: Sensation is intact to light touch throughout (facial, trunk, extremities).  Pinprick is decreased in a stocking distribution on the right leg, but not on the left.  Vibration is absent at the bilateral big toe and decreased at the ankle. There is no extinction with double simultaneous stimulation.  Motor: Strength is 5/5 in the bilateral upper and lower extremities.   Shoulder shrug is equal and symmetric.  There is no pronator drift. Deep tendon reflexes: Deep tendon reflexes are 0-1/4 at the bilateral biceps, triceps, brachioradialis, patella and achilles. Plantar responses are downgoing bilaterally.  Movement examination: Tone: There is normal tone in the bilateral upper extremities.  The tone in the lower extremities is normal.  Abnormal movements: pt had one single myoclonic jerk while in the office (he didn't even notice it) Coordination:  There is no decremation with RAM's, although  there is some slowness Gait and Station: The patient pushes off to arise.  He is a bit ataxic without the walker.  With a walker, he actually does quite well.   I have reviewed and interpreted the following labs independently   Chemistry      Component Value Date/Time   NA 138 05/18/2021 0719   NA 143 01/02/2021 1034   K 4.1 05/18/2021 0719   CL 102 05/18/2021 0719   CO2 25 05/18/2021 0719   BUN 72 (H) 05/18/2021 0719   BUN 49 (H) 01/02/2021 1034   CREATININE 3.04 (H) 05/18/2021 0719   CREATININE 3.14 (HH) 03/13/2021 1232   CREATININE 1.76 (H) 11/18/2019 0831      Component Value Date/Time   CALCIUM 9.2  05/18/2021 0719   ALKPHOS 98 05/03/2021 0952   AST 26 05/03/2021 0952   AST 26 03/13/2021 1232   ALT 20 05/03/2021 0952   ALT 19 03/13/2021 1232   BILITOT 0.6 05/03/2021 0952   BILITOT 0.7 03/13/2021 1232      Lab Results  Component Value Date   TSH 0.48 05/03/2021   Lab Results  Component Value Date   WBC 12.9 (H) 05/17/2021   HGB 9.2 (L) 05/17/2021   HCT 28.1 (L) 05/17/2021   MCV 85.9 05/17/2021   PLT 202 05/17/2021      Total time spent on today's visit was 45 minutes, including both face-to-face time and nonface-to-face time.  Time included that spent on review of records (prior notes available to me/labs/imaging if pertinent), discussing treatment and goals, answering patient's questions and coordinating care.  Cc:  Plotnikov, Evie Lacks, MD

## 2021-05-20 DIAGNOSIS — I130 Hypertensive heart and chronic kidney disease with heart failure and stage 1 through stage 4 chronic kidney disease, or unspecified chronic kidney disease: Secondary | ICD-10-CM | POA: Diagnosis not present

## 2021-05-20 DIAGNOSIS — N184 Chronic kidney disease, stage 4 (severe): Secondary | ICD-10-CM | POA: Diagnosis not present

## 2021-05-20 DIAGNOSIS — M109 Gout, unspecified: Secondary | ICD-10-CM | POA: Diagnosis not present

## 2021-05-20 DIAGNOSIS — J449 Chronic obstructive pulmonary disease, unspecified: Secondary | ICD-10-CM | POA: Diagnosis not present

## 2021-05-20 DIAGNOSIS — F411 Generalized anxiety disorder: Secondary | ICD-10-CM | POA: Diagnosis not present

## 2021-05-20 DIAGNOSIS — E1151 Type 2 diabetes mellitus with diabetic peripheral angiopathy without gangrene: Secondary | ICD-10-CM | POA: Diagnosis not present

## 2021-05-20 DIAGNOSIS — D509 Iron deficiency anemia, unspecified: Secondary | ICD-10-CM | POA: Diagnosis not present

## 2021-05-20 DIAGNOSIS — F528 Other sexual dysfunction not due to a substance or known physiological condition: Secondary | ICD-10-CM | POA: Diagnosis not present

## 2021-05-20 DIAGNOSIS — E1122 Type 2 diabetes mellitus with diabetic chronic kidney disease: Secondary | ICD-10-CM | POA: Diagnosis not present

## 2021-05-20 DIAGNOSIS — I2510 Atherosclerotic heart disease of native coronary artery without angina pectoris: Secondary | ICD-10-CM | POA: Diagnosis not present

## 2021-05-20 DIAGNOSIS — M7021 Olecranon bursitis, right elbow: Secondary | ICD-10-CM | POA: Diagnosis not present

## 2021-05-20 DIAGNOSIS — D123 Benign neoplasm of transverse colon: Secondary | ICD-10-CM | POA: Diagnosis not present

## 2021-05-20 DIAGNOSIS — D120 Benign neoplasm of cecum: Secondary | ICD-10-CM | POA: Diagnosis not present

## 2021-05-20 DIAGNOSIS — D125 Benign neoplasm of sigmoid colon: Secondary | ICD-10-CM | POA: Diagnosis not present

## 2021-05-20 DIAGNOSIS — I700 Atherosclerosis of aorta: Secondary | ICD-10-CM | POA: Diagnosis not present

## 2021-05-20 DIAGNOSIS — I5032 Chronic diastolic (congestive) heart failure: Secondary | ICD-10-CM | POA: Diagnosis not present

## 2021-05-21 LAB — CULTURE, BLOOD (ROUTINE X 2)
Culture: NO GROWTH
Culture: NO GROWTH
Special Requests: ADEQUATE

## 2021-05-22 ENCOUNTER — Telehealth: Payer: Self-pay | Admitting: Internal Medicine

## 2021-05-22 ENCOUNTER — Telehealth: Payer: Self-pay

## 2021-05-22 NOTE — Telephone Encounter (Signed)
[place] Name: [facility] Agency Name: [facility] Phone #: [phone] Service Requested: Nursing Frequency of Visits: 1 week 3 1 every 2 weeks for 6 weeks 2 PRN visits

## 2021-05-22 NOTE — Telephone Encounter (Signed)
Home Health verbal orders-caller/Agency: [place] number: [phone] (secure vm)  Requesting OT/PT/Skilled nursing/Social Work/Speech: PT and Social Work Evaluation  Frequency: 2w4 1w5

## 2021-05-22 NOTE — Telephone Encounter (Signed)
Transition Care Management Follow-up Telephone Call Date of discharge and from where:[facility] 05-18-21 Dx: Olecranon bursitis right elbow   How have you been since you were released from the hospital? Doing a lot better  Any questions or concerns? No  Items Reviewed: Did the pt receive and understand the discharge instructions provided? Yes  Medications obtained and verified? Yes  Other? No  Any new allergies since your discharge? no Dietary orders reviewed? Yes Do you have support at home? Yes   Home Care and Equipment/Supplies: Were home health services ordered? Yes PT If so, what is the name of the agency? Name unknown   Has the agency set up a time to come to the patient's home? yes Were any new equipment or medical supplies ordered?  Yes: wheelchair  What is the name of the medical supply agency? Hospital  Were you able to get the supplies/equipment? yes Do you have any questions related to the use of the equipment or supplies? No  Functional Questionnaire: (I = Independent and D = Dependent) ADLs: I  Bathing/Dressing- I  Meal Prep- D  Eating- I  Maintaining continence- I  Transferring/Ambulation- I  Managing Meds- D  Follow up appointments reviewed:  PCP Hospital f/u appt confirmed? Yes  Scheduled to see Dr Alain Marion on 05-24-21 @ 240pm. [facility] f/u appt confirmed? No . Are transportation arrangements needed? No  If their condition worsens, is the pt aware to call PCP or go to the Emergency Dept.? Yes Was the patient provided with contact information for the PCP's office or ED? Yes Was to pt encouraged to call back with questions or concerns? Yes

## 2021-05-23 ENCOUNTER — Ambulatory Visit: Payer: Medicare Other | Admitting: Neurology

## 2021-05-23 ENCOUNTER — Encounter: Payer: Self-pay | Admitting: Neurology

## 2021-05-23 ENCOUNTER — Other Ambulatory Visit: Payer: Self-pay

## 2021-05-23 VITALS — BP 124/69 | HR 76 | Ht 71.0 in | Wt 170.6 lb

## 2021-05-23 DIAGNOSIS — E1142 Type 2 diabetes mellitus with diabetic polyneuropathy: Secondary | ICD-10-CM | POA: Diagnosis not present

## 2021-05-23 DIAGNOSIS — T50905A Adverse effect of unspecified drugs, medicaments and biological substances, initial encounter: Secondary | ICD-10-CM

## 2021-05-23 DIAGNOSIS — G253 Myoclonus: Secondary | ICD-10-CM | POA: Diagnosis not present

## 2021-05-23 NOTE — Telephone Encounter (Signed)
Noted. Thanks.

## 2021-05-23 NOTE — Patient Instructions (Signed)
You have myoclonus, likely from gabapentin.  The dosage may need to be decreased (or stopped all together).  You can discuss with Plotnikov, Evie Lacks, MD tomorrow.  Patients who have kidney disease tend to have this issue (sometimes even without medications like gabapentin) You have diabetic neuropathy, which is the reason you have trouble walking.  Continue with physical therapy and continue with your walker It was good meeting you!

## 2021-05-23 NOTE — Telephone Encounter (Signed)
Okay.  Thanks.

## 2021-05-24 ENCOUNTER — Encounter: Payer: Self-pay | Admitting: Internal Medicine

## 2021-05-24 ENCOUNTER — Ambulatory Visit (INDEPENDENT_AMBULATORY_CARE_PROVIDER_SITE_OTHER): Payer: Medicare Other | Admitting: Internal Medicine

## 2021-05-24 VITALS — BP 124/62 | HR 56 | Temp 97.6°F | Ht 71.0 in | Wt 167.0 lb

## 2021-05-24 DIAGNOSIS — M1000 Idiopathic gout, unspecified site: Secondary | ICD-10-CM

## 2021-05-24 DIAGNOSIS — E1159 Type 2 diabetes mellitus with other circulatory complications: Secondary | ICD-10-CM

## 2021-05-24 DIAGNOSIS — I5032 Chronic diastolic (congestive) heart failure: Secondary | ICD-10-CM

## 2021-05-24 DIAGNOSIS — N1832 Chronic kidney disease, stage 3b: Secondary | ICD-10-CM

## 2021-05-24 MED ORDER — METHYLPREDNISOLONE ACETATE 80 MG/ML IJ SUSP
80.0000 mg | Freq: Once | INTRAMUSCULAR | Status: AC
  Administered 2021-05-24: 80 mg via INTRAMUSCULAR

## 2021-05-24 MED ORDER — HYDROCODONE-ACETAMINOPHEN 5-325 MG PO TABS: 1.0000 | ORAL_TABLET | Freq: Four times a day (QID) | ORAL | 0 refills | Status: DC | PRN

## 2021-05-24 MED ORDER — COLCHICINE 0.6 MG PO TABS: 0.6000 mg | ORAL_TABLET | Freq: Every day | ORAL | 1 refills | Status: DC

## 2021-05-24 MED ORDER — PREDNISONE 10 MG PO TABS: ORAL_TABLET | ORAL | 3 refills | Status: DC

## 2021-05-24 MED ORDER — REPAGLINIDE 1 MG PO TABS: ORAL_TABLET | ORAL | 3 refills | Status: DC

## 2021-05-24 NOTE — Assessment & Plan Note (Signed)
Monitor A1c 

## 2021-05-24 NOTE — Assessment & Plan Note (Signed)
Relapsing Now better in elbow; new in L ankle Colchicine, Medrol pack Rheum ref Diet info

## 2021-05-24 NOTE — Progress Notes (Signed)
Subjective:  Patient ID: Harold Pittman, male    DOB: 03-Nov-1940  Age: 81 y.o. MRN: [id number]  CC: No chief complaint on file.   HPI Harold Pittman presents for post-hosp f/u - R elbow gout, weakness,  C/o L ankle pain/swelling x 3-4 d.  He is here with his son who helps with history  Per hx: "  Admit Date: 05/15/2021 Discharge date: 05/18/2021   Recommendations for Outpatient Follow-up:  Follow up with PCP in 1-2 weeks Please obtain CMP/CBC in one week Please ensure follow-up with nephrology Please follow synovial fluid/blood cultures until final. ARB on hold     Admitted From:  Home   Disposition: Home health   Discharge Condition: good   CODE STATUS:   Code Status: Full Code    Diet recommendation:  Diet Order                  Diet - low sodium heart healthy             Diet Carb Modified             Diet heart healthy/carb modified Room service appropriate? Yes; Fluid consistency: Thin  Diet effective now                         Brief Summary: Patient is a 81 y.o.  male history of DM-2, HTN,Gout, COPD, CAD s/p CABG, IgG kappa monoclonal paraproteinemia-who presented with weakness, AKI on CKD stage IV and right olecranon bursitis.   Significant Hospital events: 2/06>> admit to [facility] for AKI/right olecranon bursitis.   Significant imaging studies: 2/06>> CXR: No pneumonia 2/08>> RUE Doppler: No DVT   Significant microbiology data: 2/06>> COVID/influenza PCR: Negative 2/06>> blood culture: negative 2/06>> synovial fluid culture: negative (WBC 3900, intracellular monosodium urate crystals)   Procedures: 2/07>>Arthrocentesis right elbow-by ED   Consults:  None   Brief Hospital Course: Olecranon bursitis, right elbow- (present on admission) Likely due to gout-synovial fluid cultures negative for infection-crystals were seen on analysis.  Remarkable improvement after starting empiric steroids-he is now able to move the elbow quite a lot more than  when he first presented to the ED.  He was also on empiric antibiotics but since cultures are negative-we will stop empiric antimicrobial therapy on discharge.  Right upper extremity Doppler was negative for DVT.  Both pain and swelling have significantly decreased.  He is stable for discharge on tapering steroids.  PCP to consider outpatient referral to rheumatology.   CKD (chronic kidney disease) stage 4, GFR 15-29 ml/min (HCC)- (present on admission) Has progressive worsening renal function over the past 1 year-apparently is taking double dosing of torsemide that he is supposed to be on.  Resume torsemide 50 mg on discharge (he apparently was taken 100 mg prior).  Continue to hold ARB.  Follow with nephrology/PCP.   Generalized weakness Due to acute illness-advanced age/frailty-appreciate PT/OT eval-will arrange for home health services prior to discharge.   Gout- (present on admission) Per patient-he has a known history of gout-review of medications reveals that he is only on colchicine as needed.  See above-continue tapering prednisone on discharge.   Chronic diastolic CHF (congestive heart failure) ([place])- (present on admission) Euvolemic-resume torsemide on discharge.   Essential hypertension- (present on admission) BP stable on amlodipine, Coreg and hydralazine.  Resuming torsemide-since BP stable-We will continue to hold irbesartan for now.  PCP to reassess at next visit.     Type  2 diabetes mellitus (A1c 6.0 on 05/03/2021) with steroid-induced hyperglycemia Diet controlled at home.  CBG stable with SSI        Recent Labs    05/17/21 1716 05/17/21 2028 05/18/21 0754  GLUCAP 222* 198* 147*        CAD (coronary artery disease)- (present on admission) No anginal symptoms-continue aspirin, Coreg and statin.  Troponins minimally elevated and of no clinical significance.    Hypokalemia- (present on admission) Repleted-recheck periodically.   Nutrition Status: Nutrition Problem:  Increased nutrient needs Etiology: chronic illness Signs/Symptoms: estimated needs Interventions: MVI, Premier Protein   BMI: Estimated body mass index is 22.08 kg/m as calculated from the following:   Height as of this encounter: 6' 1" (1.854 m).   Weight as of this encounter: 75.9 kg.        Discharge Diagnoses:  Active Problems:   Olecranon bursitis, right elbow   CKD (chronic kidney disease) stage 4, GFR 15-29 ml/min (HCC)   Gout   Generalized weakness   Chronic diastolic CHF (congestive heart failure) (HCC)   Essential hypertension   Type 2 diabetes mellitus (A1c 6.0 on 05/03/2021) with steroid-induced hyperglycemia   CAD (coronary artery disease)   Hypokalemia  "    Outpatient Medications Prior to Visit  Medication Sig Dispense Refill   amLODipine (NORVASC) 10 MG tablet Take 1 tablet (10 mg total) by mouth daily. 90 tablet 3   aspirin 81 MG EC tablet Take 1 tablet (81 mg total) by mouth daily. 100 tablet 3   Blood Glucose Monitoring Suppl (ONETOUCH VERIO FLEX SYSTEM) w/Device KIT USE TO MONITOR BLOOD SUGAR 1 kit 0   carvedilol (COREG) 25 MG tablet TAKE 1 TABLET BY MOUTH  TWICE DAILY WITH A MEAL (Patient taking differently: Take 25 mg by mouth 2 (two) times daily with a meal.) 180 tablet 3   Cholecalciferol (VITAMIN D3) 50 MCG (2000 UT) capsule Take 1 capsule (2,000 Units total) by mouth daily. 100 capsule 3   Cyanocobalamin (VITAMIN B-12) 1000 MCG SUBL Place 1 tablet (1,000 mcg total) under the tongue daily. 100 tablet 3   docusate sodium (COLACE) 100 MG capsule Take 1 capsule (100 mg total) by mouth 2 (two) times daily. (Patient taking differently: Take 100 mg by mouth daily as needed for mild constipation.) 10 capsule 0   famotidine (PEPCID) 40 MG tablet Take 1 tablet (40 mg total) by mouth daily. 90 tablet 3   glucose blood (ONETOUCH VERIO) test strip CHECK BLOOD SUGAR 1 TO 2  TIMES DAILY AS DIRECTED 200 strip 3   hydrALAZINE (APRESOLINE) 50 MG tablet TAKE 1 TABLET BY  MOUTH 3  TIMES DAILY (Patient taking differently: Take 50 mg by mouth 3 (three) times daily.) 270 tablet 3   Lancets (ONETOUCH DELICA PLUS FGHWEX93Z) MISC USE AS DIRECTED 200 each 3   omeprazole (PRILOSEC) 40 MG capsule TAKE 1 CAPSULE BY MOUTH  DAILY (Patient taking differently: Take 40 mg by mouth daily.) 90 capsule 3   ondansetron (ZOFRAN) 4 MG tablet Take 1 tablet (4 mg total) by mouth every 8 (eight) hours as needed for nausea or vomiting. 21 tablet 1   potassium chloride (KLOR-CON) 10 MEQ tablet TAKE 1 TABLET(10 MEQ) BY MOUTH DAILY 7 tablet 0   pravastatin (PRAVACHOL) 80 MG tablet TAKE 1 TABLET BY MOUTH AT  BEDTIME (Patient taking differently: Take 80 mg by mouth at bedtime.) 90 tablet 3   tadalafil (ADCIRCA/CIALIS) 20 MG tablet Take 20 mg by mouth as needed for erectile  dysfunction.     torsemide (DEMADEX) 100 MG tablet TAKE 1 TABLET BY MOUTH DAILY 90 tablet 3   colchicine 0.6 MG tablet Take 1 tablet (0.6 mg total) by mouth 2 (two) times daily as needed. (Patient taking differently: Take 0.6 mg by mouth 2 (two) times daily as needed (gout flare).) 30 tablet 0   gabapentin (NEURONTIN) 300 MG capsule Take 300 mg by mouth 3 (three) times daily.     HYDROcodone-acetaminophen (NORCO) 5-325 MG tablet Take 1 tablet by mouth every 6 (six) hours as needed for moderate pain. 20 tablet 0   predniSONE (DELTASONE) 10 MG tablet Take 4 tablets by mouth daily for 1 day, 3 tablets daily for 1 day, 2 tablets daily for 1 days, then 1 tablet daily for 1 day, then stop 10 tablet 0   ferrous sulfate 325 (65 FE) MG tablet Take 1 tablet (325 mg total) by mouth daily. 90 tablet 3   No facility-administered medications prior to visit.    ROS: Review of Systems  Constitutional:  Positive for fatigue. Negative for appetite change and unexpected weight change.  HENT:  Negative for congestion, nosebleeds, sneezing, sore throat and trouble swallowing.   Eyes:  Negative for itching and visual disturbance.  Respiratory:   Negative for cough.   Cardiovascular:  Negative for chest pain, palpitations and leg swelling.  Gastrointestinal:  Negative for abdominal distention, blood in stool, diarrhea and nausea.  Genitourinary:  Negative for frequency and hematuria.  Musculoskeletal:  Positive for arthralgias and gait problem. Negative for back pain, joint swelling and neck pain.  Skin:  Negative for rash.  Neurological:  Negative for dizziness, tremors, speech difficulty and weakness.  Psychiatric/Behavioral:  Negative for agitation, dysphoric mood and sleep disturbance. The patient is not nervous/anxious.    Objective:  BP 124/62 (BP Location: Left Arm, Patient Position: Sitting, Cuff Size: Large)    Pulse (!) 56    Temp 97.6 F (36.4 C) (Oral)    Ht 5' 11" (1.803 m)    Wt 167 lb (75.8 kg)    SpO2 95%    BMI 23.29 kg/m   BP Readings from Last 3 Encounters:  06/05/21 122/68  05/24/21 124/62  05/23/21 124/69    Wt Readings from Last 3 Encounters:  06/05/21 168 lb (76.2 kg)  05/24/21 167 lb (75.8 kg)  05/23/21 170 lb 9.6 oz (77.4 kg)    Physical Exam Constitutional:      General: He is not in acute distress.    Appearance: Normal appearance. He is well-developed.     Comments: NAD  Eyes:     Conjunctiva/sclera: Conjunctivae normal.     Pupils: Pupils are equal, round, and reactive to light.  Neck:     Thyroid: No thyromegaly.     Vascular: No JVD.  Cardiovascular:     Rate and Rhythm: Normal rate and regular rhythm.     Heart sounds: Normal heart sounds. No murmur heard.   No friction rub. No gallop.  Pulmonary:     Effort: Pulmonary effort is normal. No respiratory distress.     Breath sounds: Normal breath sounds. No wheezing or rales.  Chest:     Chest wall: No tenderness.  Abdominal:     General: Bowel sounds are normal. There is no distension.     Palpations: Abdomen is soft. There is no mass.     Tenderness: There is no abdominal tenderness. There is no guarding or rebound.   Musculoskeletal:  General: No tenderness. Normal range of motion.     Cervical back: Normal range of motion.  Lymphadenopathy:     Cervical: No cervical adenopathy.  Skin:    General: Skin is warm and dry.     Findings: No rash.  Neurological:     Mental Status: He is alert and oriented to person, place, and time.     Cranial Nerves: No cranial nerve deficit.     Motor: No abnormal muscle tone.     Coordination: Coordination abnormal.     Gait: Gait abnormal.     Deep Tendon Reflexes: Reflexes are normal and symmetric.  Psychiatric:        Behavior: Behavior normal.        Thought Content: Thought content normal.        Judgment: Judgment normal.   Using a cane Lab Results  Component Value Date   WBC 12.9 (H) 05/17/2021   HGB 9.2 (L) 05/17/2021   HCT 28.1 (L) 05/17/2021   PLT 202 05/17/2021   GLUCOSE 165 (H) 05/18/2021   CHOL 120 07/21/2019   TRIG 68.0 07/21/2019   HDL 31.70 (L) 07/21/2019   LDLCALC 75 07/21/2019   ALT 20 05/03/2021   AST 26 05/03/2021   NA 138 05/18/2021   K 4.1 05/18/2021   CL 102 05/18/2021   CREATININE 3.04 (H) 05/18/2021   BUN 72 (H) 05/18/2021   CO2 25 05/18/2021   TSH 0.48 05/03/2021   PSA 8.92 (H) 04/22/2019   INR 1.35 07/23/2015   HGBA1C 6.0 05/03/2021   MICROALBUR 38.7 (H) 11/24/2013    DG Chest 2 View  Result Date: 05/15/2021 CLINICAL DATA:  Fever. EXAM: CHEST - 2 VIEW COMPARISON:  Chest x-ray 11/29/2020. FINDINGS: Sternotomy wires are present. The aorta is tortuous. Cardiac silhouette is within normal limits. Lungs are clear. No pleural effusion or pneumothorax. No acute fractures. IMPRESSION: 1. No acute cardiopulmonary process. Electronically Signed   By: Ronney Asters M.D.   On: 05/15/2021 18:04    Assessment & Plan:   Problem List Items Addressed This Visit     Chronic diastolic CHF (congestive heart failure) ([place])    F./u w/Dr Burt Knack      CKD (chronic kidney disease), stage III (HCC)    Monitoring GFR      Gout -  Primary    Relapsing Now better in elbow; new in L ankle Colchicine, Medrol pack Rheum ref Diet info      Relevant Orders   Ambulatory referral to Rheumatology   Type 2 diabetes mellitus (A1c 6.0 on 05/03/2021) with steroid-induced hyperglycemia    Monitor A1c      Relevant Medications   repaglinide (PRANDIN) 1 MG tablet      Meds ordered this encounter  Medications   predniSONE (DELTASONE) 10 MG tablet    Sig: Take 4 tablets by mouth daily for 1 day, 3 tablets daily for 1 day, 2 tablets daily for 1 days, then 1 tablet daily for 1 day, then stop    Dispense:  10 tablet    Refill:  3    PRN gout   HYDROcodone-acetaminophen (NORCO) 5-325 MG tablet    Sig: Take 1 tablet by mouth every 6 (six) hours as needed for moderate pain.    Dispense:  60 tablet    Refill:  0   colchicine 0.6 MG tablet    Sig: Take 1 tablet (0.6 mg total) by mouth daily.    Dispense:  90 tablet  Refill:  1    Requesting 1 year supply   repaglinide (PRANDIN) 1 MG tablet    Sig: 1 po before or with dinner    Dispense:  90 tablet    Refill:  3   methylPREDNISolone acetate (DEPO-MEDROL) injection 80 mg      Follow-up: Return in about 6 weeks (around 07/05/2021).  Walker Kehr, MD

## 2021-05-24 NOTE — Patient Instructions (Signed)
Gout ?Gout is a condition that causes painful swelling of the joints. Gout is a type of inflammation of the joints (arthritis). This condition is caused by having too much uric acid in the body. Uric acid is a chemical that forms when the body breaks down substances called purines. Purines are important for building body proteins. ?When the body has too much uric acid, sharp crystals can form and build up inside the joints. This causes pain and swelling. Gout attacks can happen quickly and may be very painful (acute gout). Over time, the attacks can affect more joints and become more frequent (chronic gout). Gout can also cause uric acid to build up under the skin and inside the kidneys. ?What are the causes? ?This condition is caused by too much uric acid in your blood. This can happen because: ?Your kidneys do not remove enough uric acid from your blood. This is the most common cause. ?Your body makes too much uric acid. This can happen with some cancers and cancer treatments. It can also occur if your body is breaking down too many red blood cells (hemolytic anemia). ?You eat too many foods that are high in purines. These foods include organ meats and some seafood. Alcohol, especially beer, is also high in purines. ?A gout attack may be triggered by trauma or stress. ?What increases the risk? ?You are more likely to develop this condition if you: ?Have a family history of gout. ?Are male and middle-aged. ?Are male and have gone through menopause. ?Are obese. ?Frequently drink alcohol, especially beer. ?Are dehydrated. ?Lose weight too quickly. ?Have an organ transplant. ?Have lead poisoning. ?Take certain medicines, including aspirin, cyclosporine, diuretics, levodopa, and niacin. ?Have kidney disease. ?Have a skin condition called psoriasis. ?What are the signs or symptoms? ?An attack of acute gout happens quickly. It usually occurs in just one joint. The most common place is the big toe. Attacks often start  at night. Other joints that may be affected include joints of the feet, ankle, knee, fingers, wrist, or elbow. Symptoms of this condition may include: ?Severe pain. ?Warmth. ?Swelling. ?Stiffness. ?Tenderness. The affected joint may be very painful to touch. ?Shiny, red, or purple skin. ?Chills and fever. ?Chronic gout may cause symptoms more frequently. More joints may be involved. You may also have white or yellow lumps (tophi) on your hands or feet or in other areas near your joints. ?How is this diagnosed? ?This condition is diagnosed based on your symptoms, medical history, and physical exam. You may have tests, such as: ?Blood tests to measure uric acid levels. ?Removal of joint fluid with a thin needle (aspiration) to look for uric acid crystals. ?X-rays to look for joint damage. ?How is this treated? ?Treatment for this condition has two phases: treating an acute attack and preventing future attacks. Acute gout treatment may include medicines to reduce pain and swelling, including: ?NSAIDs. ?Steroids. These are strong anti-inflammatory medicines that can be taken by mouth (orally) or injected into a joint. ?Colchicine. This medicine relieves pain and swelling when it is taken soon after an attack. It can be given by mouth or through an IV. ?Preventive treatment may include: ?Daily use of smaller doses of NSAIDs or colchicine. ?Use of a medicine that reduces uric acid levels in your blood. ?Changes to your diet. You may need to see a dietitian about what to eat and drink to prevent gout. ?Follow these instructions at home: ?During a gout attack ? ?If directed, put ice on the affected area: ?  Put ice in a plastic bag. ?Place a towel between your skin and the bag. ?Leave the ice on for 20 minutes, 2-3 times a day. ?Raise (elevate) the affected joint above the level of your heart as often as possible. ?Rest the joint as much as possible. If the affected joint is in your leg, you may be given crutches to  use. ?Follow instructions from your health care provider about eating or drinking restrictions. ?Avoiding future gout attacks ?Follow a low-purine diet as told by your dietitian or health care provider. Avoid foods and drinks that are high in purines, including liver, kidney, anchovies, asparagus, herring, mushrooms, mussels, and beer. ?Maintain a healthy weight or lose weight if you are overweight. If you want to lose weight, talk with your health care provider. It is important that you do not lose weight too quickly. ?Start or maintain an exercise program as told by your health care provider. ?Eating and drinking ?Drink enough fluids to keep your urine pale yellow. ?If you drink alcohol: ?Limit how much you use to: ?0-1 drink a day for women. ?0-2 drinks a day for men. ?Be aware of how much alcohol is in your drink. In the [place] one drink equals one 12 oz bottle of beer (355 mL) one 5 oz glass of wine (148 mL), or one 1? oz glass of hard liquor (44 mL). ?General instructions ?Take over-the-counter and prescription medicines only as told by your health care provider. ?Do not drive or use heavy machinery while taking prescription pain medicine. ?Return to your normal activities as told by your health care provider. Ask your health care provider what activities are safe for you. ?Keep all follow-up visits as told by your health care provider. This is important. ?Contact a health care provider if you have: ?Another gout attack. ?Continuing symptoms of a gout attack after 10 days of treatment. ?Side effects from your medicines. ?Chills or a fever. ?Burning pain when you urinate. ?Pain in your lower back or belly. ?Get help right away if you: ?Have severe or uncontrolled pain. ?Cannot urinate. ?Summary ?Gout is painful swelling of the joints caused by inflammation. ?The most common site of pain is the big toe, but it can affect other joints in the body. ?Medicines and dietary changes can help to prevent and treat gout  attacks. ?This information is not intended to replace advice given to you by your health care provider. Make sure you discuss any questions you have with your health care provider. ?Document Revised: 10/04/2017 Document Reviewed: 10/16/2017 ?Elsevier Patient Education ? 2022 [employer] ? ?

## 2021-05-24 NOTE — Assessment & Plan Note (Signed)
F/u w/Dr Cooper 

## 2021-05-24 NOTE — Assessment & Plan Note (Signed)
Monitoring GFR 

## 2021-05-25 ENCOUNTER — Other Ambulatory Visit: Payer: Self-pay | Admitting: Internal Medicine

## 2021-05-25 NOTE — Telephone Encounter (Signed)
Tonya w/ [facility] checking status of verbal orders  Caller also inquiring if bursitis flare up in pts R. elbow has resolved  Caller requesting a c/b [phone]  *see below*

## 2021-05-25 NOTE — Telephone Encounter (Signed)
Verbal orders given  Per OV note on 2/15 bursitis is improving

## 2021-05-26 ENCOUNTER — Telehealth: Payer: Self-pay

## 2021-05-26 NOTE — Telephone Encounter (Signed)
Berton Lan is calling to make Dr. Alain Marion aware that his social worker visit will be Monday 2/20  FYI

## 2021-06-02 ENCOUNTER — Telehealth: Payer: Self-pay | Admitting: Internal Medicine

## 2021-06-02 DIAGNOSIS — M1990 Unspecified osteoarthritis, unspecified site: Secondary | ICD-10-CM

## 2021-06-02 DIAGNOSIS — M1000 Idiopathic gout, unspecified site: Secondary | ICD-10-CM

## 2021-06-02 DIAGNOSIS — R531 Weakness: Secondary | ICD-10-CM

## 2021-06-02 DIAGNOSIS — R269 Unspecified abnormalities of gait and mobility: Secondary | ICD-10-CM

## 2021-06-02 DIAGNOSIS — M545 Low back pain, unspecified: Secondary | ICD-10-CM

## 2021-06-02 NOTE — Telephone Encounter (Signed)
Patient daughter Harold Pittman calling in  [facility] rx provider sent for [place] wheelchair needs a diagnoses code.. needs to be sent to [facility] off [address]  Please let daughter know when it has been done [phone]

## 2021-06-05 ENCOUNTER — Encounter: Payer: Self-pay | Admitting: Internal Medicine

## 2021-06-05 ENCOUNTER — Other Ambulatory Visit: Payer: Self-pay

## 2021-06-05 ENCOUNTER — Ambulatory Visit (INDEPENDENT_AMBULATORY_CARE_PROVIDER_SITE_OTHER): Payer: Medicare Other | Admitting: Internal Medicine

## 2021-06-05 DIAGNOSIS — I10 Essential (primary) hypertension: Secondary | ICD-10-CM

## 2021-06-05 DIAGNOSIS — K219 Gastro-esophageal reflux disease without esophagitis: Secondary | ICD-10-CM

## 2021-06-05 DIAGNOSIS — M1000 Idiopathic gout, unspecified site: Secondary | ICD-10-CM | POA: Diagnosis not present

## 2021-06-06 NOTE — Telephone Encounter (Signed)
DME has been printed and given to PCP to be signed.

## 2021-06-06 NOTE — Telephone Encounter (Signed)
Maria please type of durable medical equipment prescription.  Diagnosis-osteoarthritis and gait disorder.  Thanks

## 2021-06-13 ENCOUNTER — Other Ambulatory Visit (HOSPITAL_COMMUNITY): Payer: Self-pay

## 2021-06-13 ENCOUNTER — Encounter: Payer: Self-pay | Admitting: Internal Medicine

## 2021-06-13 NOTE — Assessment & Plan Note (Signed)
Cont on Colchicine, Medrol pack prn ?

## 2021-06-13 NOTE — Assessment & Plan Note (Signed)
Cont on Coreg, Hydralazine, Lasix, Irbesartan  ?

## 2021-06-13 NOTE — Assessment & Plan Note (Signed)
Cont on Omeprazole 

## 2021-06-13 NOTE — Progress Notes (Signed)
Subjective:  Patient ID: Harold Pittman, male    DOB: [birth year]  Age: 81 y.o. MRN: [id number]  CC: No chief complaint on file.   HPI Harold Pittman presents for gout, HTN, GERD f/u  Outpatient Medications Prior to Visit  Medication Sig Dispense Refill   amLODipine (NORVASC) 10 MG tablet Take 1 tablet (10 mg total) by mouth daily. 90 tablet 3   aspirin 81 MG EC tablet Take 1 tablet (81 mg total) by mouth daily. 100 tablet 3   Blood Glucose Monitoring Suppl (ONETOUCH VERIO FLEX SYSTEM) w/Device KIT USE TO MONITOR BLOOD SUGAR 1 kit 0   carvedilol (COREG) 25 MG tablet TAKE 1 TABLET BY MOUTH  TWICE DAILY WITH A MEAL (Patient taking differently: Take 25 mg by mouth 2 (two) times daily with a meal.) 180 tablet 3   Cholecalciferol (VITAMIN D3) 50 MCG (2000 UT) capsule Take 1 capsule (2,000 Units total) by mouth daily. 100 capsule 3   colchicine 0.6 MG tablet Take 1 tablet (0.6 mg total) by mouth daily. 90 tablet 1   Cyanocobalamin (VITAMIN B-12) 1000 MCG SUBL Place 1 tablet (1,000 mcg total) under the tongue daily. 100 tablet 3   docusate sodium (COLACE) 100 MG capsule Take 1 capsule (100 mg total) by mouth 2 (two) times daily. (Patient taking differently: Take 100 mg by mouth daily as needed for mild constipation.) 10 capsule 0   famotidine (PEPCID) 40 MG tablet Take 1 tablet (40 mg total) by mouth daily. 90 tablet 3   glucose blood (ONETOUCH VERIO) test strip CHECK BLOOD SUGAR 1 TO 2  TIMES DAILY AS DIRECTED 200 strip 3   hydrALAZINE (APRESOLINE) 50 MG tablet TAKE 1 TABLET BY MOUTH 3  TIMES DAILY (Patient taking differently: Take 50 mg by mouth 3 (three) times daily.) 270 tablet 3   HYDROcodone-acetaminophen (NORCO) 5-325 MG tablet Take 1 tablet by mouth every 6 (six) hours as needed for moderate pain. 60 tablet 0   Lancets (ONETOUCH DELICA PLUS LANCET30G) MISC USE AS DIRECTED 200 each 3   omeprazole (PRILOSEC) 40 MG capsule TAKE 1 CAPSULE BY MOUTH  DAILY (Patient taking differently: Take 40 mg  by mouth daily.) 90 capsule 3   ondansetron (ZOFRAN) 4 MG tablet Take 1 tablet (4 mg total) by mouth every 8 (eight) hours as needed for nausea or vomiting. 21 tablet 1   potassium chloride (KLOR-CON) 10 MEQ tablet TAKE 1 TABLET(10 MEQ) BY MOUTH DAILY 7 tablet 0   pravastatin (PRAVACHOL) 80 MG tablet TAKE 1 TABLET BY MOUTH AT  BEDTIME (Patient taking differently: Take 80 mg by mouth at bedtime.) 90 tablet 3   predniSONE (DELTASONE) 10 MG tablet Take 4 tablets by mouth daily for 1 day, 3 tablets daily for 1 day, 2 tablets daily for 1 days, then 1 tablet daily for 1 day, then stop 10 tablet 3   repaglinide (PRANDIN) 1 MG tablet 1 po before or with dinner 90 tablet 3   tadalafil (ADCIRCA/CIALIS) 20 MG tablet Take 20 mg by mouth as needed for erectile dysfunction.     torsemide (DEMADEX) 100 MG tablet TAKE 1 TABLET BY MOUTH DAILY 90 tablet 3   ferrous sulfate 325 (65 FE) MG tablet Take 1 tablet (325 mg total) by mouth daily. 90 tablet 3   No facility-administered medications prior to visit.    ROS: Review of Systems  Constitutional:  Positive for fatigue. Negative for appetite change and unexpected weight change.  HENT:  Negative for congestion,  nosebleeds, sneezing, sore throat and trouble swallowing.   Eyes:  Negative for itching and visual disturbance.  Respiratory:  Negative for cough.   Cardiovascular:  Negative for chest pain, palpitations and leg swelling.  Gastrointestinal:  Negative for abdominal distention, blood in stool, diarrhea and nausea.  Genitourinary:  Negative for frequency and hematuria.  Musculoskeletal:  Positive for arthralgias. Negative for back pain, gait problem, joint swelling and neck pain.  Skin:  Negative for rash.  Neurological:  Negative for dizziness, tremors, speech difficulty and weakness.  Psychiatric/Behavioral:  Negative for agitation, dysphoric mood and sleep disturbance. The patient is not nervous/anxious.    Objective:  BP 122/68 (BP Location: Left  Arm, Patient Position: Sitting, Cuff Size: Large)    Pulse (!) 55    Temp 98 F (36.7 C) (Oral)    Ht _0  (1.803 m)    Wt 168 lb (76.2 kg)    SpO2 98%    BMI 23.43 kg/m   BP Readings from Last 3 Encounters:  06/05/21 122/68  05/24/21 124/62  05/23/21 124/69    Wt Readings from Last 3 Encounters:  06/05/21 168 lb (76.2 kg)  05/24/21 167 lb (75.8 kg)  05/23/21 170 lb 9.6 oz (77.4 kg)    Physical Exam Constitutional:      General: He is not in acute distress.    Appearance: Normal appearance. He is well-developed.     Comments: NAD  Eyes:     Conjunctiva/sclera: Conjunctivae normal.     Pupils: Pupils are equal, round, and reactive to light.  Neck:     Thyroid: No thyromegaly.     Vascular: No JVD.  Cardiovascular:     Rate and Rhythm: Normal rate and regular rhythm.     Heart sounds: Normal heart sounds. No murmur heard.   No friction rub. No gallop.  Pulmonary:     Effort: Pulmonary effort is normal. No respiratory distress.     Breath sounds: Normal breath sounds. No wheezing or rales.  Chest:     Chest wall: No tenderness.  Abdominal:     General: Bowel sounds are normal. There is no distension.     Palpations: Abdomen is soft. There is no mass.     Tenderness: There is no abdominal tenderness. There is no guarding or rebound.  Musculoskeletal:        General: No tenderness. Normal range of motion.     Cervical back: Normal range of motion.  Lymphadenopathy:     Cervical: No cervical adenopathy.  Skin:    General: Skin is warm and dry.     Findings: No rash.  Neurological:     Mental Status: He is alert and oriented to person, place, and time.     Cranial Nerves: No cranial nerve deficit.     Motor: No abnormal muscle tone.     Coordination: Coordination normal.     Gait: Gait normal.     Deep Tendon Reflexes: Reflexes are normal and symmetric.  Psychiatric:        Behavior: Behavior normal.        Thought Content: Thought content normal.        Judgment:  Judgment normal.    Lab Results  Component Value Date   WBC 12.9 (H) 05/17/2021   HGB 9.2 (L) 05/17/2021   HCT 28.1 (L) 05/17/2021   PLT 202 05/17/2021   GLUCOSE 165 (H) 05/18/2021   CHOL 120 07/21/2019   TRIG 68.0 07/21/2019   HDL 31.70 (  L) 07/21/2019   LDLCALC 75 07/21/2019   ALT 20 05/03/2021   AST 26 05/03/2021   NA 138 05/18/2021   K 4.1 05/18/2021   CL 102 05/18/2021   CREATININE 3.04 (H) 05/18/2021   BUN 72 (H) 05/18/2021   CO2 25 05/18/2021   TSH 0.48 05/03/2021   PSA 8.92 (H) 04/22/2019   INR 1.35 07/23/2015   HGBA1C 6.0 05/03/2021   MICROALBUR 38.7 (H) 11/24/2013    DG Chest 2 View  Result Date: 05/15/2021 CLINICAL DATA:  Fever. EXAM: CHEST - 2 VIEW COMPARISON:  Chest x-ray 11/29/2020. FINDINGS: Sternotomy wires are present. The aorta is tortuous. Cardiac silhouette is within normal limits. Lungs are clear. No pleural effusion or pneumothorax. No acute fractures. IMPRESSION: 1. No acute cardiopulmonary process. Electronically Signed   By: Ronney Asters M.D.   On: 05/15/2021 18:04    Assessment & Plan:   Problem List Items Addressed This Visit     Essential hypertension    Cont on Coreg, Hydralazine, Lasix, Irbesartan       GERD    Cont on Omeprazole      Gout    Cont on Colchicine, Medrol pack prn         No orders of the defined types were placed in this encounter.     Follow-up: No follow-ups on file.  Walker Kehr, MD

## 2021-06-19 DIAGNOSIS — D123 Benign neoplasm of transverse colon: Secondary | ICD-10-CM | POA: Diagnosis not present

## 2021-06-19 DIAGNOSIS — F411 Generalized anxiety disorder: Secondary | ICD-10-CM | POA: Diagnosis not present

## 2021-06-19 DIAGNOSIS — E1122 Type 2 diabetes mellitus with diabetic chronic kidney disease: Secondary | ICD-10-CM | POA: Diagnosis not present

## 2021-06-19 DIAGNOSIS — D509 Iron deficiency anemia, unspecified: Secondary | ICD-10-CM | POA: Diagnosis not present

## 2021-06-19 DIAGNOSIS — D120 Benign neoplasm of cecum: Secondary | ICD-10-CM | POA: Diagnosis not present

## 2021-06-19 DIAGNOSIS — N184 Chronic kidney disease, stage 4 (severe): Secondary | ICD-10-CM | POA: Diagnosis not present

## 2021-06-19 DIAGNOSIS — E1151 Type 2 diabetes mellitus with diabetic peripheral angiopathy without gangrene: Secondary | ICD-10-CM | POA: Diagnosis not present

## 2021-06-19 DIAGNOSIS — M7021 Olecranon bursitis, right elbow: Secondary | ICD-10-CM | POA: Diagnosis not present

## 2021-06-19 DIAGNOSIS — M109 Gout, unspecified: Secondary | ICD-10-CM | POA: Diagnosis not present

## 2021-06-19 DIAGNOSIS — I2510 Atherosclerotic heart disease of native coronary artery without angina pectoris: Secondary | ICD-10-CM | POA: Diagnosis not present

## 2021-06-19 DIAGNOSIS — J449 Chronic obstructive pulmonary disease, unspecified: Secondary | ICD-10-CM | POA: Diagnosis not present

## 2021-06-19 DIAGNOSIS — D125 Benign neoplasm of sigmoid colon: Secondary | ICD-10-CM | POA: Diagnosis not present

## 2021-06-19 DIAGNOSIS — I700 Atherosclerosis of aorta: Secondary | ICD-10-CM | POA: Diagnosis not present

## 2021-06-19 DIAGNOSIS — I5032 Chronic diastolic (congestive) heart failure: Secondary | ICD-10-CM | POA: Diagnosis not present

## 2021-06-19 DIAGNOSIS — F528 Other sexual dysfunction not due to a substance or known physiological condition: Secondary | ICD-10-CM | POA: Diagnosis not present

## 2021-06-19 DIAGNOSIS — I130 Hypertensive heart and chronic kidney disease with heart failure and stage 1 through stage 4 chronic kidney disease, or unspecified chronic kidney disease: Secondary | ICD-10-CM | POA: Diagnosis not present

## 2021-06-20 DIAGNOSIS — H9190 Unspecified hearing loss, unspecified ear: Secondary | ICD-10-CM

## 2021-06-20 DIAGNOSIS — F4321 Adjustment disorder with depressed mood: Secondary | ICD-10-CM

## 2021-06-20 DIAGNOSIS — M109 Gout, unspecified: Secondary | ICD-10-CM

## 2021-06-20 DIAGNOSIS — I2510 Atherosclerotic heart disease of native coronary artery without angina pectoris: Secondary | ICD-10-CM

## 2021-06-20 DIAGNOSIS — K8020 Calculus of gallbladder without cholecystitis without obstruction: Secondary | ICD-10-CM

## 2021-06-20 DIAGNOSIS — F411 Generalized anxiety disorder: Secondary | ICD-10-CM

## 2021-06-20 DIAGNOSIS — I130 Hypertensive heart and chronic kidney disease with heart failure and stage 1 through stage 4 chronic kidney disease, or unspecified chronic kidney disease: Secondary | ICD-10-CM | POA: Diagnosis not present

## 2021-06-20 DIAGNOSIS — Z86010 Personal history of colonic polyps: Secondary | ICD-10-CM

## 2021-06-20 DIAGNOSIS — I700 Atherosclerosis of aorta: Secondary | ICD-10-CM

## 2021-06-20 DIAGNOSIS — J449 Chronic obstructive pulmonary disease, unspecified: Secondary | ICD-10-CM

## 2021-06-20 DIAGNOSIS — Z87891 Personal history of nicotine dependence: Secondary | ICD-10-CM

## 2021-06-20 DIAGNOSIS — K5900 Constipation, unspecified: Secondary | ICD-10-CM

## 2021-06-20 DIAGNOSIS — M7021 Olecranon bursitis, right elbow: Secondary | ICD-10-CM | POA: Diagnosis not present

## 2021-06-20 DIAGNOSIS — M1990 Unspecified osteoarthritis, unspecified site: Secondary | ICD-10-CM

## 2021-06-20 DIAGNOSIS — K219 Gastro-esophageal reflux disease without esophagitis: Secondary | ICD-10-CM

## 2021-06-20 DIAGNOSIS — D125 Benign neoplasm of sigmoid colon: Secondary | ICD-10-CM

## 2021-06-20 DIAGNOSIS — E1151 Type 2 diabetes mellitus with diabetic peripheral angiopathy without gangrene: Secondary | ICD-10-CM

## 2021-06-20 DIAGNOSIS — G5600 Carpal tunnel syndrome, unspecified upper limb: Secondary | ICD-10-CM

## 2021-06-20 DIAGNOSIS — N184 Chronic kidney disease, stage 4 (severe): Secondary | ICD-10-CM

## 2021-06-20 DIAGNOSIS — D123 Benign neoplasm of transverse colon: Secondary | ICD-10-CM

## 2021-06-20 DIAGNOSIS — D509 Iron deficiency anemia, unspecified: Secondary | ICD-10-CM

## 2021-06-20 DIAGNOSIS — E1122 Type 2 diabetes mellitus with diabetic chronic kidney disease: Secondary | ICD-10-CM | POA: Diagnosis not present

## 2021-06-20 DIAGNOSIS — G4700 Insomnia, unspecified: Secondary | ICD-10-CM

## 2021-06-20 DIAGNOSIS — I5032 Chronic diastolic (congestive) heart failure: Secondary | ICD-10-CM | POA: Diagnosis not present

## 2021-06-20 DIAGNOSIS — F528 Other sexual dysfunction not due to a substance or known physiological condition: Secondary | ICD-10-CM

## 2021-06-20 DIAGNOSIS — D120 Benign neoplasm of cecum: Secondary | ICD-10-CM

## 2021-06-20 DIAGNOSIS — K2970 Gastritis, unspecified, without bleeding: Secondary | ICD-10-CM

## 2021-06-27 NOTE — Progress Notes (Deleted)
?Cardiology Office Note:   ? ?Date:  06/27/2021  ? ?ID:  [name], DOB June 24, 1940, MRN [id number] ? ?PCP:  Plotnikov, Evie Lacks, MD  ?[facility] Providers ?Cardiologist:  Sherren Mocha, MD { ?Click to update primary MD,subspecialty MD or APP then REFRESH:1}  *** ?Referring MD: Cassandria Anger, MD  ? ?Chief Complaint:  No chief complaint on file. ?{Click here for Visit Info    :1}  ? ?Patient Profile: ?Coronary artery disease  ?S/p CABG in 1997 ?Myoview 10/19:  low risk  ?(HFpEF) heart failure with preserved ejection fraction  ?Echocardiogram 10/19: EF 60-65, Gr 1 DD ?Hypertension ?Hyperlipidemia  ?Diabetes mellitus  ?Chronic kidney disease  ?COPD  ?OSA ?Chronic shortness of breath  ?Pulmonary hypertension  ?Echocardiogram 10/19: PASP 35 (mildly increased)  ? ? ?Prior CV Studies: ?Echocardiogram 01/11/2019 ?EF 55-60, GR 1 DD, moderate LVH, normal RVSF, trace MR, trivial TR, RVSP 27.8 ? ?Abdominal ultrasound 09/04/2019 ?No abdominal aortic aneurysm ? ?Echo 01/30/18 ?EF 60-65, no RWMA, Gr 1 DD, mild MR, normal RVSF, mod TR, PASP 35 (mild increase) ?  ?Myoview 01/30/18 ?Normal stress nuclear study with no ischemia or infarction.  Gated ejection fraction 63% with normal wall motion. ?  ?Echo 07/23/15 ?Mild conc LVH, EF 60-65, prob basal inf AK, normal diastolic function, mildly calc AV leaflets, mild MR, PASP 45 (mod pulmonary HTN) ?  ?Renal Art [place] 05/02/2011 ?Normal caliber abdominal aorta. ?Mod stenosis of celiac axis ?Normal renal arteries bilat ?Multiple R kidney cysts ?{Select studies to display:26339}  ? ?History of Present Illness:   ?Harold Pittman is a 81 y.o. male with the above problem list.  He was last seen in 9/22 by Laurann Montana, NP in follow-up to the emergency room for chest pain.  This was felt to be musculoskeletal chest discomfort.  No further testing was indicated.  He returns for follow-up.***     ?   ?Past Medical History:  ?Diagnosis Date  ? Anemia   ? low iron  ? Anxiety state,  unspecified   ? Blood in stool   ? Chronic airway obstruction, not elsewhere classified   ? Coronary artery disease   ? Coronary atherosclerosis of artery bypass graft   ? Dyspnea   ? ED (erectile dysfunction)   ? Esophageal reflux   ? Gout, unspecified   ? Osteoarthrosis, unspecified whether generalized or localized, unspecified site   ? Other and unspecified hyperlipidemia   ? Other specified disorder of rectum and anus   ? Personal history of tobacco use, presenting hazards to health   ? Sleep apnea   ? does not use cpap  ? Type II or unspecified type diabetes mellitus without mention of complication, not stated as uncontrolled   ? Unspecified essential hypertension   ? ?Current Medications: ?No outpatient medications have been marked as taking for the 06/28/21 encounter (Appointment) with Liliane Shi, PA-C.  ?  ?Allergies:   Benazepril and Gabapentin  ? ?Social History  ? ?Tobacco Use  ? Smoking status: Former  ? Smokeless tobacco: Never  ? Tobacco comments:  ?  Stopped 1997  ?Vaping Use  ? Vaping Use: Never used  ?Substance Use Topics  ? Alcohol use: No  ?  Comment: Stopped 1997  ? Drug use: No  ?  ?Family Hx: ?The patient's family history includes Coronary artery disease in an other family member; Depression in his sister; Heart disease in his sister; Heart disease (age of onset: 31) in his mother; Hypertension in  an other family member; Mental illness in his father; Stroke in his son. There is no history of Colon cancer, Stomach cancer, Esophageal cancer, Liver disease, Pancreatic cancer, or Rectal cancer. ? ?ROS  ? ?EKGs/Labs/Other Test Reviewed:   ? ?EKG:  EKG is *** ordered today.  The ekg ordered today demonstrates *** ? ?Recent Labs: ?05/03/2021: ALT 20; TSH 0.48 ?05/17/2021: Hemoglobin 9.2; Magnesium 2.1; Platelets 202 ?05/18/2021: BUN 72; Creatinine, Ser 3.04; Potassium 4.1; Sodium 138  ? ?Recent Lipid Panel ?No results for input(s): CHOL, TRIG, HDL, VLDL, LDLCALC, LDLDIRECT in the last 8760 hours.   ? ?Risk Assessment/Calculations:   ?{Does this patient have ATRIAL FIBRILLATION?:[id number]} ?    ?Physical Exam:   ? ?VS:  There were no vitals taken for this visit.   ? ?Wt Readings from Last 3 Encounters:  ?06/05/21 168 lb (76.2 kg)  ?05/24/21 167 lb (75.8 kg)  ?05/23/21 170 lb 9.6 oz (77.4 kg)  ?  ?Physical Exam *** ?    ?ASSESSMENT & PLAN:   ?No problem-specific Assessment & Plan notes found for this encounter. ?*** ?Chronic diastolic CHF (congestive heart failure) ([place])  ?Echocardiogram in October 2019 with normal LV function and mild diastolic dysfunction.  He is on high-dose torsemide.  His volume status appears stable.  His breathing is stable.  Continue current regimen. ?  ?Coronary artery disease involving native coronary artery of native heart without angina pectoris  ?History of CABG in 1997.  Recent nuclear stress test with no ischemia or scar.  He denies anginal symptoms.  Continue aspirin, beta-blocker, statin.  Follow-up 1 year. ?  ?Essential hypertension ?Blood pressure above goal today.  However, it is usually close to goal or optimal.  Continue to monitor for now.  Continue current regimen. ?  ?CKD (chronic kidney disease), stage III ([place]) ?Recent creatinine stable. ?  ?Chronic obstructive pulmonary disease, unspecified COPD type ([place])  ?He quit smoking in 1997 after smoking for several years.  He was previously on albuterol and Qvar.  I suspect his shortness of breath is multifactorial but mainly related to prior history of smoking, especially with noted mild pulmonary hypertension on echocardiogram. ?  ?Dyslipidemia - ?LDL optimal on most recent lab work.  Continue current Rx.   ?     ?{Are you ordering a CV Procedure (e.g. stress test, cath, DCCV, TEE, etc)?   Press F2        :[id number]}  ?Dispo:  No follow-ups on file.  ? ?Medication Adjustments/Labs and Tests Ordered: ?Current medicines are reviewed at length with the patient today.  Concerns regarding medicines are outlined above.  ?Tests  Ordered: ?No orders of the defined types were placed in this encounter. ? ?Medication Changes: ?No orders of the defined types were placed in this encounter. ? ?Signed, ?Richardson Dopp, PA-C  ?06/27/2021 5:42 PM    ?[place] ?[address], [place], [place]  [address] ?Phone: [phone]; Fax: [phone]  ?

## 2021-06-28 ENCOUNTER — Ambulatory Visit: Payer: Medicare Other | Admitting: Physician Assistant

## 2021-06-28 DIAGNOSIS — I2510 Atherosclerotic heart disease of native coronary artery without angina pectoris: Secondary | ICD-10-CM

## 2021-06-28 DIAGNOSIS — I5032 Chronic diastolic (congestive) heart failure: Secondary | ICD-10-CM

## 2021-06-28 DIAGNOSIS — N1832 Chronic kidney disease, stage 3b: Secondary | ICD-10-CM

## 2021-06-28 DIAGNOSIS — I10 Essential (primary) hypertension: Secondary | ICD-10-CM

## 2021-06-28 DIAGNOSIS — E785 Hyperlipidemia, unspecified: Secondary | ICD-10-CM

## 2021-07-05 ENCOUNTER — Ambulatory Visit: Payer: Medicare Other | Admitting: Internal Medicine

## 2021-07-10 ENCOUNTER — Telehealth: Payer: Self-pay | Admitting: Internal Medicine

## 2021-07-10 ENCOUNTER — Other Ambulatory Visit: Payer: Self-pay

## 2021-07-10 MED ORDER — FAMOTIDINE 40 MG PO TABS: 40.0000 mg | ORAL_TABLET | Freq: Every day | ORAL | 3 refills | Status: DC

## 2021-07-10 MED ORDER — AMLODIPINE BESYLATE 10 MG PO TABS: 10.0000 mg | ORAL_TABLET | Freq: Every day | ORAL | 3 refills | Status: DC

## 2021-07-10 NOTE — Telephone Encounter (Signed)
1.Medication Requested: ?amLODipine (NORVASC) 10 MG tablet ?famotidine (PEPCID) 40 MG tablet ? ?2. Pharmacy (Name, Street, [place]): ?[facility]) [place], [place] Phone:  [phone]  ?Fax:  [phone]  ?  ? ?3. On Med List: y  ? ?4. Last Visit with PCP:  ? ?5. Next visit date with PCP: ? ?Pt is completely out of of AmLODipine ? ?Agent: Please be advised that RX refills may take up to 3 business days. We ask that you follow-up with your pharmacy.  ?

## 2021-07-18 ENCOUNTER — Ambulatory Visit (INDEPENDENT_AMBULATORY_CARE_PROVIDER_SITE_OTHER): Payer: Medicare Other | Admitting: Internal Medicine

## 2021-07-18 ENCOUNTER — Encounter: Payer: Self-pay | Admitting: Internal Medicine

## 2021-07-18 DIAGNOSIS — R634 Abnormal weight loss: Secondary | ICD-10-CM

## 2021-07-18 DIAGNOSIS — E1159 Type 2 diabetes mellitus with other circulatory complications: Secondary | ICD-10-CM

## 2021-07-18 DIAGNOSIS — E1151 Type 2 diabetes mellitus with diabetic peripheral angiopathy without gangrene: Secondary | ICD-10-CM | POA: Diagnosis not present

## 2021-07-18 DIAGNOSIS — H9193 Unspecified hearing loss, bilateral: Secondary | ICD-10-CM | POA: Diagnosis not present

## 2021-07-18 LAB — COMPREHENSIVE METABOLIC PANEL
ALT: 20 U/L (ref 0–53)
AST: 24 U/L (ref 0–37)
Albumin: 4.5 g/dL (ref 3.5–5.2)
Alkaline Phosphatase: 96 U/L (ref 39–117)
BUN: 34 mg/dL — ABNORMAL HIGH (ref 6–23)
CO2: 32 mEq/L (ref 19–32)
Calcium: 9.5 mg/dL (ref 8.4–10.5)
Chloride: 105 mEq/L (ref 96–112)
Creatinine, Ser: 2.19 mg/dL — ABNORMAL HIGH (ref 0.40–1.50)
GFR: 27.8 mL/min — ABNORMAL LOW (ref >60.00)
Glucose, Bld: 100 mg/dL — ABNORMAL HIGH (ref 70–99)
Potassium: 3.5 mEq/L (ref 3.5–5.1)
Sodium: 144 mEq/L (ref 135–145)
Total Bilirubin: 0.6 mg/dL (ref 0.2–1.2)
Total Protein: 6.9 g/dL (ref 6.0–8.3)

## 2021-07-18 LAB — HEMOGLOBIN A1C: Hgb A1c MFr Bld: 5.9 % (ref 4.6–6.5)

## 2021-07-18 LAB — URIC ACID: Uric Acid, Serum: 10 mg/dL — ABNORMAL HIGH (ref 4.0–7.8)

## 2021-07-18 MED ORDER — DOCUSATE SODIUM 100 MG PO CAPS: 100.0000 mg | ORAL_CAPSULE | Freq: Every day | ORAL | 3 refills | Status: DC | PRN

## 2021-07-18 MED ORDER — AMLODIPINE BESYLATE 10 MG PO TABS: 10.0000 mg | ORAL_TABLET | Freq: Every day | ORAL | 3 refills | Status: DC

## 2021-07-18 MED ORDER — FAMOTIDINE 40 MG PO TABS: 40.0000 mg | ORAL_TABLET | Freq: Every day | ORAL | 3 refills | Status: DC

## 2021-07-18 NOTE — Assessment & Plan Note (Signed)
Wt Readings from Last 3 Encounters:  ?07/18/21 175 lb 9.6 oz (79.7 kg)  ?06/05/21 168 lb (76.2 kg)  ?05/24/21 167 lb (75.8 kg)  ? ? ?

## 2021-07-18 NOTE — Assessment & Plan Note (Signed)
Get hearing aids at [facility]. No wax ?

## 2021-07-18 NOTE — Assessment & Plan Note (Signed)
Check A1c. 

## 2021-07-18 NOTE — Assessment & Plan Note (Addendum)
Cont w/Metformin, Pravastatin, ASA ?Check A1c ?

## 2021-07-18 NOTE — Progress Notes (Signed)
? ?Subjective:  ?Patient ID: Harold Pittman, male    DOB: [birth year]  Age: 81 y.o. MRN: [id number] ? ?CC: 6 week f/u ? ? ?HPI ?Harold Pittman presents for HTN, gout, GERD, HTN ?Colchicine is too $$$ ? ?Outpatient Medications Prior to Visit  ?Medication Sig Dispense Refill  ? aspirin 81 MG EC tablet Take 1 tablet (81 mg total) by mouth daily. 100 tablet 3  ? Blood Glucose Monitoring Suppl (ONETOUCH VERIO FLEX SYSTEM) w/Device KIT USE TO MONITOR BLOOD SUGAR 1 kit 0  ? carvedilol (COREG) 25 MG tablet TAKE 1 TABLET BY MOUTH  TWICE DAILY WITH A MEAL (Patient taking differently: Take 25 mg by mouth 2 (two) times daily with a meal.) 180 tablet 3  ? Cholecalciferol (VITAMIN D3) 50 MCG (2000 UT) capsule Take 1 capsule (2,000 Units total) by mouth daily. 100 capsule 3  ? colchicine 0.6 MG tablet Take 1 tablet (0.6 mg total) by mouth daily. 90 tablet 1  ? Cyanocobalamin (VITAMIN B-12) 1000 MCG SUBL Place 1 tablet (1,000 mcg total) under the tongue daily. 100 tablet 3  ? hydrALAZINE (APRESOLINE) 50 MG tablet TAKE 1 TABLET BY MOUTH 3  TIMES DAILY (Patient taking differently: Take 50 mg by mouth 3 (three) times daily.) 270 tablet 3  ? omeprazole (PRILOSEC) 40 MG capsule TAKE 1 CAPSULE BY MOUTH  DAILY (Patient taking differently: Take 40 mg by mouth daily.) 90 capsule 3  ? potassium chloride (KLOR-CON) 10 MEQ tablet TAKE 1 TABLET(10 MEQ) BY MOUTH DAILY 7 tablet 0  ? pravastatin (PRAVACHOL) 80 MG tablet TAKE 1 TABLET BY MOUTH AT  BEDTIME (Patient taking differently: Take 80 mg by mouth at bedtime.) 90 tablet 3  ? repaglinide (PRANDIN) 1 MG tablet 1 po before or with dinner 90 tablet 3  ? tadalafil (ADCIRCA/CIALIS) 20 MG tablet Take 20 mg by mouth as needed for erectile dysfunction.    ? torsemide (DEMADEX) 100 MG tablet TAKE 1 TABLET BY MOUTH DAILY 90 tablet 3  ? amLODipine (NORVASC) 10 MG tablet Take 1 tablet (10 mg total) by mouth daily. 90 tablet 3  ? famotidine (PEPCID) 40 MG tablet Take 1 tablet (40 mg total) by mouth daily.  90 tablet 3  ? ferrous sulfate 325 (65 FE) MG tablet Take 1 tablet (325 mg total) by mouth daily. 90 tablet 3  ? glucose blood (ONETOUCH VERIO) test strip CHECK BLOOD SUGAR 1 TO 2  TIMES DAILY AS DIRECTED (Patient not taking: Reported on 07/18/2021) 200 strip 3  ? HYDROcodone-acetaminophen (NORCO) 5-325 MG tablet Take 1 tablet by mouth every 6 (six) hours as needed for moderate pain. (Patient not taking: Reported on 07/18/2021) 60 tablet 0  ? Lancets (ONETOUCH DELICA PLUS [id number]) MISC USE AS DIRECTED (Patient not taking: Reported on 07/18/2021) 200 each 3  ? ondansetron (ZOFRAN) 4 MG tablet Take 1 tablet (4 mg total) by mouth every 8 (eight) hours as needed for nausea or vomiting. (Patient not taking: Reported on 07/18/2021) 21 tablet 1  ? predniSONE (DELTASONE) 10 MG tablet Take 4 tablets by mouth daily for 1 day, 3 tablets daily for 1 day, 2 tablets daily for 1 days, then 1 tablet daily for 1 day, then stop (Patient not taking: Reported on 07/18/2021) 10 tablet 3  ? docusate sodium (COLACE) 100 MG capsule Take 1 capsule (100 mg total) by mouth 2 (two) times daily. (Patient not taking: Reported on 07/18/2021) 10 capsule 0  ? ?No facility-administered medications prior to visit.  ? ? ?  ROS: ?Review of Systems ? ?Objective:  ?BP 128/78   Pulse (!) 56   Temp 97.6 ?F (36.4 ?C) (Oral)   Ht 5' 11"  (1.803 m)   Wt 175 lb 9.6 oz (79.7 kg)   SpO2 97%   BMI 24.49 kg/m?  ? ?BP Readings from Last 3 Encounters:  ?07/18/21 128/78  ?06/05/21 122/68  ?05/24/21 124/62  ? ? ?Wt Readings from Last 3 Encounters:  ?07/18/21 175 lb 9.6 oz (79.7 kg)  ?06/05/21 168 lb (76.2 kg)  ?05/24/21 167 lb (75.8 kg)  ? ? ?Physical Exam ? ?Lab Results  ?Component Value Date  ? WBC 12.9 (H) 05/17/2021  ? HGB 9.2 (L) 05/17/2021  ? HCT 28.1 (L) 05/17/2021  ? PLT 202 05/17/2021  ? GLUCOSE 165 (H) 05/18/2021  ? CHOL 120 07/21/2019  ? TRIG 68.0 07/21/2019  ? HDL 31.70 (L) 07/21/2019  ? [place] 75 07/21/2019  ? ALT 20 05/03/2021  ? AST 26 05/03/2021  ? NA  138 05/18/2021  ? K 4.1 05/18/2021  ? CL 102 05/18/2021  ? CREATININE 3.04 (H) 05/18/2021  ? BUN 72 (H) 05/18/2021  ? CO2 25 05/18/2021  ? TSH 0.48 05/03/2021  ? PSA 8.92 (H) 04/22/2019  ? INR 1.35 07/23/2015  ? HGBA1C 6.0 05/03/2021  ? MICROALBUR 38.7 (H) 11/24/2013  ? ? ?DG Chest 2 View ? ?Result Date: 05/15/2021 ?CLINICAL DATA:  Fever. EXAM: CHEST - 2 VIEW COMPARISON:  Chest x-ray 11/29/2020. FINDINGS: Sternotomy wires are present. The aorta is tortuous. Cardiac silhouette is within normal limits. Lungs are clear. No pleural effusion or pneumothorax. No acute fractures. IMPRESSION: 1. No acute cardiopulmonary process. Electronically Signed   By: Ronney Asters M.D.   On: 05/15/2021 18:04  ? ? ?Assessment & Plan:  ? ?Problem List Items Addressed This Visit   ? ? Hearing loss  ?  Get hearing aids at [facility]. No wax ?  ?  ? DM (diabetes mellitus), type 2 with peripheral vascular complications ([place])  ?  Cont w/Metformin, Pravastatin, ASA ?Check A1c ?  ?  ? Relevant Medications  ? amLODipine (NORVASC) 10 MG tablet  ? Other Relevant Orders  ? Comprehensive metabolic panel  ? Uric acid  ? Hemoglobin A1c  ? Weight loss  ?  Wt Readings from Last 3 Encounters:  ?07/18/21 175 lb 9.6 oz (79.7 kg)  ?06/05/21 168 lb (76.2 kg)  ?05/24/21 167 lb (75.8 kg)  ? ?  ?  ? Relevant Orders  ? Comprehensive metabolic panel  ? Uric acid  ? Hemoglobin A1c  ? 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  Check A1c ?  ?  ?  ? ? ?Meds ordered this encounter  ?Medications  ? amLODipine (NORVASC) 10 MG tablet  ?  Sig: Take 1 tablet (10 mg total) by mouth daily.  ?  Dispense:  90 tablet  ?  Refill:  3  ? famotidine (PEPCID) 40 MG tablet  ?  Sig: Take 1 tablet (40 mg total) by mouth daily.  ?  Dispense:  90 tablet  ?  Refill:  3  ?  Requesting 1 year supply  ? docusate sodium (COLACE) 100 MG capsule  ?  Sig: Take 1 capsule (100 mg total) by mouth daily as needed for mild constipation.  ?  Dispense:  100 capsule   ?  Refill:  3  ?  ? ? ?Follow-up: Return in about 3 months (around 10/17/2021) for a follow-up visit. ? ?  Walker Kehr, MD ?

## 2021-07-20 ENCOUNTER — Telehealth: Payer: Self-pay | Admitting: Internal Medicine

## 2021-07-20 NOTE — Telephone Encounter (Signed)
1.Medication Requested: HYDROcodone-acetaminophen (NORCO) 5-325 MG tablet ? ?2. Pharmacy (Name, Street, [place]): [facility] [place] ? ?3. On Med List: Y ? ?4. Last Visit with PCP: 07-18-2021 ? ?5. Next visit date with PCP: 08-01-2021 ? ? ?Agent: Please be advised that RX refills may take up to 3 business days. We ask that you follow-up with your pharmacy.  ?

## 2021-07-21 MED ORDER — HYDROCODONE-ACETAMINOPHEN 5-325 MG PO TABS: 1.0000 | ORAL_TABLET | Freq: Four times a day (QID) | ORAL | 0 refills | Status: DC | PRN

## 2021-07-21 NOTE — Telephone Encounter (Signed)
Okay.  Thanks.

## 2021-08-01 ENCOUNTER — Ambulatory Visit: Payer: Medicare Other | Admitting: Internal Medicine

## 2021-08-23 ENCOUNTER — Encounter: Payer: Self-pay | Admitting: Family Medicine

## 2021-08-23 ENCOUNTER — Ambulatory Visit (INDEPENDENT_AMBULATORY_CARE_PROVIDER_SITE_OTHER): Payer: Medicare Other | Admitting: Family Medicine

## 2021-08-23 VITALS — BP 146/64 | HR 60 | Temp 97.6°F | Ht 71.0 in | Wt 174.0 lb

## 2021-08-23 DIAGNOSIS — M25522 Pain in left elbow: Secondary | ICD-10-CM | POA: Diagnosis not present

## 2021-08-23 DIAGNOSIS — M1000 Idiopathic gout, unspecified site: Secondary | ICD-10-CM

## 2021-08-23 NOTE — Progress Notes (Signed)
? ?Subjective:  ? ? ? Patient ID: Harold Pittman, male    DOB: 05-16-1940, 81 y.o.   MRN: [id number] ? ?Chief Complaint  ?Patient presents with  ? gout flare up  ?  Left arm and left leg, first noticed yesterday or day before.  ? ? ?HPI ?Patient is in today for left elbow pain and swelling x 2 days. Hx of gout in right arm.  ?His daughter is with him.  ? ?Started on colchicine this morning. Does not take allopurinol. He has CKD.  ? ?Pain kept him awake last night.  ? ?Denies fever, chills, dizziness, chest pain, palpitations, shortness of breath, abdominal pain, N/V/D.  ? ?Denies any other arthralgias or myalgias.  ? ? ?He has hydrocodone at home but has not taken it yet.  ?He also has refills on steroid dose pak but has not picked up refill yet.  ? ? ? ?Health Maintenance Due  ?Topic Date Due  ? Zoster Vaccines- Shingrix (1 of 2) Never done  ? FOOT EXAM  09/19/2017  ? ? ?Past Medical History:  ?Diagnosis Date  ? Anemia   ? low iron  ? Anxiety state, unspecified   ? Blood in stool   ? Chronic airway obstruction, not elsewhere classified   ? Coronary artery disease   ? Coronary atherosclerosis of artery bypass graft   ? Dyspnea   ? ED (erectile dysfunction)   ? Esophageal reflux   ? Gout, unspecified   ? Osteoarthrosis, unspecified whether generalized or localized, unspecified site   ? Other and unspecified hyperlipidemia   ? Other specified disorder of rectum and anus   ? Personal history of tobacco use, presenting hazards to health   ? Sleep apnea   ? does not use cpap  ? Type II or unspecified type diabetes mellitus without mention of complication, not stated as uncontrolled   ? Unspecified essential hypertension   ? ? ?Past Surgical History:  ?Procedure Laterality Date  ? BIOPSY  09/26/2017  ? Procedure: BIOPSY;  Surgeon: Ladene Artist, MD;  Location: [facility] ENDOSCOPY;  Service: Endoscopy;;  ? BIOPSY  02/03/2018  ? Procedure: BIOPSY;  Surgeon: Irving Copas., MD;  Location: [place];  Service:  Gastroenterology;;  ? COLONOSCOPY WITH PROPOFOL N/A 09/26/2017  ? Procedure: COLONOSCOPY WITH PROPOFOL;  Surgeon: Ladene Artist, MD;  Location: [facility] ENDOSCOPY;  Service: Endoscopy;  Laterality: N/A;  ? CORONARY ARTERY BYPASS GRAFT  97  ? LIMA to LAD, sequential saphenous vein graft to the first and second diagonal, sequential saphenous vein graft to the intermediate OM and circumflex and SVG to RCA  ? ESOPHAGOGASTRODUODENOSCOPY (EGD) WITH PROPOFOL N/A 09/26/2017  ? Procedure: ESOPHAGOGASTRODUODENOSCOPY (EGD) WITH PROPOFOL;  Surgeon: Ladene Artist, MD;  Location: [facility] ENDOSCOPY;  Service: Endoscopy;  Laterality: N/A;  ? ESOPHAGOGASTRODUODENOSCOPY (EGD) WITH PROPOFOL N/A 02/03/2018  ? Procedure: ESOPHAGOGASTRODUODENOSCOPY (EGD);  Surgeon: Irving Copas., MD;  Location: [place];  Service: Gastroenterology;  Laterality: N/A;  ? KNEE SURGERY    ? BILATERAL  ? POLYPECTOMY  09/26/2017  ? Procedure: POLYPECTOMY;  Surgeon: Ladene Artist, MD;  Location: [facility] ENDOSCOPY;  Service: Endoscopy;;  ? ROTATOR CUFF REPAIR    ? SUBMUCOSAL INJECTION  02/03/2018  ? Procedure: SUBMUCOSAL INJECTION;  Surgeon: Irving Copas., MD;  Location: [place];  Service: Gastroenterology;;  ? ? ?Family History  ?Problem Relation Age of Onset  ? Heart disease Mother 69  ?     CAD  ? Mental illness Father   ?  Alzheimer's  ? Depression Sister   ? Heart disease Sister   ? Stroke Son   ? Hypertension Other   ? Coronary artery disease Other   ?     Male 1st degree relative <50  ? Colon cancer Neg Hx   ? Stomach cancer Neg Hx   ? Esophageal cancer Neg Hx   ? Liver disease Neg Hx   ? Pancreatic cancer Neg Hx   ? Rectal cancer Neg Hx   ? ? ?Social History  ? ?Socioeconomic History  ? Marital status: Widowed  ?  Spouse name: Not on file  ? Number of children: Not on file  ? Years of education: Not on file  ? Highest education level: Not on file  ?Occupational History  ? Occupation: RETIRED  ?  Comment: Journalist, [employer] - welder   ?Tobacco Use  ? Smoking status: Former  ? Smokeless tobacco: Never  ? Tobacco comments:  ?  Stopped 1997  ?Vaping Use  ? Vaping Use: Never used  ?Substance and Sexual Activity  ? Alcohol use: No  ?  Comment: Stopped 1997  ? Drug use: No  ? Sexual activity: Not Currently  ?Other Topics Concern  ? Not on file  ?Social History Narrative  ? Patient does not get regular exercise  ? Daily Caffeine use - 6  ?   ? Wife decease 06-09-05;   ?   ? ?Social Determinants of Health  ? ?Financial Resource Strain: Not on file  ?Food Insecurity: Not on file  ?Transportation Needs: Not on file  ?Physical Activity: Not on file  ?Stress: Not on file  ?Social Connections: Not on file  ?Intimate Partner Violence: Not on file  ? ? ?Outpatient Medications Prior to Visit  ?Medication Sig Dispense Refill  ? amLODipine (NORVASC) 10 MG tablet Take 1 tablet (10 mg total) by mouth daily. 90 tablet 3  ? aspirin 81 MG EC tablet Take 1 tablet (81 mg total) by mouth daily. 100 tablet 3  ? Blood Glucose Monitoring Suppl (ONETOUCH VERIO FLEX SYSTEM) w/Device KIT USE TO MONITOR BLOOD SUGAR 1 kit 0  ? carvedilol (COREG) 25 MG tablet TAKE 1 TABLET BY MOUTH  TWICE DAILY WITH A MEAL (Patient taking differently: Take 25 mg by mouth 2 (two) times daily with a meal.) 180 tablet 3  ? Cholecalciferol (VITAMIN D3) 50 MCG (2000 UT) capsule Take 1 capsule (2,000 Units total) by mouth daily. 100 capsule 3  ? colchicine 0.6 MG tablet Take 1 tablet (0.6 mg total) by mouth daily. 90 tablet 1  ? Cyanocobalamin (VITAMIN B-12) 1000 MCG SUBL Place 1 tablet (1,000 mcg total) under the tongue daily. 100 tablet 3  ? docusate sodium (COLACE) 100 MG capsule Take 1 capsule (100 mg total) by mouth daily as needed for mild constipation. 100 capsule 3  ? famotidine (PEPCID) 40 MG tablet Take 1 tablet (40 mg total) by mouth daily. 90 tablet 3  ? ferrous sulfate 325 (65 FE) MG tablet Take 1 tablet (325 mg total) by mouth daily. 90 tablet 3  ? glucose blood (ONETOUCH VERIO) test strip  CHECK BLOOD SUGAR 1 TO 2  TIMES DAILY AS DIRECTED 200 strip 3  ? hydrALAZINE (APRESOLINE) 50 MG tablet TAKE 1 TABLET BY MOUTH 3  TIMES DAILY (Patient taking differently: Take 50 mg by mouth 3 (three) times daily.) 270 tablet 3  ? HYDROcodone-acetaminophen (NORCO) 5-325 MG tablet Take 1 tablet by mouth every 6 (six) hours as needed for moderate pain.  60 tablet 0  ? Lancets (ONETOUCH DELICA PLUS [id number]) MISC USE AS DIRECTED 200 each 3  ? omeprazole (PRILOSEC) 40 MG capsule TAKE 1 CAPSULE BY MOUTH  DAILY (Patient taking differently: Take 40 mg by mouth daily.) 90 capsule 3  ? ondansetron (ZOFRAN) 4 MG tablet Take 1 tablet (4 mg total) by mouth every 8 (eight) hours as needed for nausea or vomiting. 21 tablet 1  ? potassium chloride (KLOR-CON) 10 MEQ tablet TAKE 1 TABLET(10 MEQ) BY MOUTH DAILY 7 tablet 0  ? pravastatin (PRAVACHOL) 80 MG tablet TAKE 1 TABLET BY MOUTH AT  BEDTIME (Patient taking differently: Take 80 mg by mouth at bedtime.) 90 tablet 3  ? predniSONE (DELTASONE) 10 MG tablet Take 4 tablets by mouth daily for 1 day, 3 tablets daily for 1 day, 2 tablets daily for 1 days, then 1 tablet daily for 1 day, then stop 10 tablet 3  ? repaglinide (PRANDIN) 1 MG tablet 1 po before or with dinner 90 tablet 3  ? tadalafil (ADCIRCA/CIALIS) 20 MG tablet Take 20 mg by mouth as needed for erectile dysfunction.    ? torsemide (DEMADEX) 100 MG tablet TAKE 1 TABLET BY MOUTH DAILY 90 tablet 3  ? ?No facility-administered medications prior to visit.  ? ? ?Allergies  ?Allergen Reactions  ? Benazepril Shortness Of Breath  ?  Cough, wheezing  ? Gabapentin   ?  myoclonus  ? ? ?ROS ?Pertinent positives and negatives in the history of present illness. ? ?   ?Objective:  ?  ?Physical Exam ?Constitutional:   ?   General: He is not in acute distress. ?   Appearance: He is not ill-appearing.  ?Cardiovascular:  ?   Rate and Rhythm: Normal rate.  ?Pulmonary:  ?   Effort: Pulmonary effort is normal.  ?Musculoskeletal:  ?   Left elbow:  Swelling present. No effusion. Decreased range of motion. Tenderness present in olecranon process.  ?   Comments: Left lateral elbow with mild swelling and erythema, TTP, pain with movement. LUE is neurovascularly [place]

## 2021-08-23 NOTE — Patient Instructions (Signed)
Take the colchicine twice daily as prescribed for this flare up.  ? ?Also, take the prednisone steroid tapering dose pak.  ? ?Stay well hydrated.  ? ?Follow up with Dr. Alain Marion as needed.  ?

## 2021-09-25 ENCOUNTER — Other Ambulatory Visit: Payer: Self-pay | Admitting: *Deleted

## 2021-09-25 ENCOUNTER — Inpatient Hospital Stay: Payer: Medicare Other | Attending: Internal Medicine

## 2021-09-25 MED ORDER — OMEPRAZOLE 40 MG PO CPDR: 40.0000 mg | DELAYED_RELEASE_CAPSULE | Freq: Every day | ORAL | 3 refills | Status: DC

## 2021-10-02 ENCOUNTER — Inpatient Hospital Stay: Payer: Medicare Other | Admitting: Hematology

## 2021-10-02 ENCOUNTER — Other Ambulatory Visit: Payer: Self-pay

## 2021-10-02 DIAGNOSIS — D472 Monoclonal gammopathy: Secondary | ICD-10-CM

## 2021-10-03 ENCOUNTER — Telehealth: Payer: Self-pay | Admitting: Hematology

## 2021-10-04 ENCOUNTER — Other Ambulatory Visit: Payer: Self-pay | Admitting: *Deleted

## 2021-10-04 MED ORDER — PRAVASTATIN SODIUM 80 MG PO TABS: 80.0000 mg | ORAL_TABLET | Freq: Every day | ORAL | 3 refills | Status: DC

## 2021-10-04 MED ORDER — CARVEDILOL 25 MG PO TABS: 25.0000 mg | ORAL_TABLET | Freq: Two times a day (BID) | ORAL | 3 refills | Status: DC

## 2021-10-16 ENCOUNTER — Ambulatory Visit (INDEPENDENT_AMBULATORY_CARE_PROVIDER_SITE_OTHER): Payer: Medicare Other | Admitting: Internal Medicine

## 2021-10-16 ENCOUNTER — Encounter: Payer: Self-pay | Admitting: Internal Medicine

## 2021-10-16 DIAGNOSIS — R634 Abnormal weight loss: Secondary | ICD-10-CM

## 2021-10-16 DIAGNOSIS — E1159 Type 2 diabetes mellitus with other circulatory complications: Secondary | ICD-10-CM | POA: Diagnosis not present

## 2021-10-16 DIAGNOSIS — I10 Essential (primary) hypertension: Secondary | ICD-10-CM | POA: Diagnosis not present

## 2021-10-16 DIAGNOSIS — R600 Localized edema: Secondary | ICD-10-CM | POA: Diagnosis not present

## 2021-10-16 MED ORDER — AMLODIPINE BESYLATE 10 MG PO TABS
5.0000 mg | ORAL_TABLET | Freq: Every day | ORAL | 1 refills | Status: DC
Start: 2021-10-16 — End: 2022-10-01

## 2021-10-16 NOTE — Progress Notes (Signed)
This encounter was created in error - please disregard.

## 2021-10-16 NOTE — Assessment & Plan Note (Signed)
Due to MM, CRF Ensure qd

## 2021-10-16 NOTE — Assessment & Plan Note (Signed)
Reduce Amlodipine to 5 mg/day - low BP Cont on Coreg, Hydralazine, Torsemide

## 2021-10-16 NOTE — Progress Notes (Signed)
Subjective:  Patient ID: Harold Pittman, male    DOB: 06/29/1940  Age: 81 y.o. MRN: [id number]  CC: No chief complaint on file.   HPI Harold Pittman presents for HTN, gout, DM, MM    Outpatient Medications Prior to Visit  Medication Sig Dispense Refill   aspirin 81 MG EC tablet Take 1 tablet (81 mg total) by mouth daily. 100 tablet 3   Blood Glucose Monitoring Suppl (ONETOUCH VERIO FLEX SYSTEM) w/Device KIT USE TO MONITOR BLOOD SUGAR 1 kit 0   carvedilol (COREG) 25 MG tablet Take 1 tablet (25 mg total) by mouth 2 (two) times daily with a meal. 180 tablet 3   Cholecalciferol (VITAMIN D3) 50 MCG (2000 UT) capsule Take 1 capsule (2,000 Units total) by mouth daily. 100 capsule 3   colchicine 0.6 MG tablet Take 1 tablet (0.6 mg total) by mouth daily. (Patient taking differently: Take 0.6 mg by mouth every other day.) 90 tablet 1   Cyanocobalamin (VITAMIN B-12) 1000 MCG SUBL Place 1 tablet (1,000 mcg total) under the tongue daily. 100 tablet 3   docusate sodium (COLACE) 100 MG capsule Take 1 capsule (100 mg total) by mouth daily as needed for mild constipation. 100 capsule 3   famotidine (PEPCID) 40 MG tablet Take 1 tablet (40 mg total) by mouth daily. 90 tablet 3   glucose blood (ONETOUCH VERIO) test strip CHECK BLOOD SUGAR 1 TO 2  TIMES DAILY AS DIRECTED 200 strip 3   HYDROcodone-acetaminophen (NORCO) 5-325 MG tablet Take 1 tablet by mouth every 6 (six) hours as needed for moderate pain. 60 tablet 0   Lancets (ONETOUCH DELICA PLUS [id number]) MISC USE AS DIRECTED 200 each 3   omeprazole (PRILOSEC) 40 MG capsule Take 1 capsule (40 mg total) by mouth daily. 90 capsule 3   ondansetron (ZOFRAN) 4 MG tablet Take 1 tablet (4 mg total) by mouth every 8 (eight) hours as needed for nausea or vomiting. 21 tablet 1   potassium chloride (KLOR-CON) 10 MEQ tablet TAKE 1 TABLET(10 MEQ) BY MOUTH DAILY 7 tablet 0   pravastatin (PRAVACHOL) 80 MG tablet Take 1 tablet (80 mg total) by mouth at bedtime. 90  tablet 3   repaglinide (PRANDIN) 1 MG tablet 1 po before or with dinner 90 tablet 3   torsemide (DEMADEX) 100 MG tablet TAKE 1 TABLET BY MOUTH DAILY 90 tablet 3   amLODipine (NORVASC) 10 MG tablet Take 1 tablet (10 mg total) by mouth daily. 90 tablet 3   predniSONE (DELTASONE) 10 MG tablet Take 4 tablets by mouth daily for 1 day, 3 tablets daily for 1 day, 2 tablets daily for 1 days, then 1 tablet daily for 1 day, then stop 10 tablet 3   tadalafil (ADCIRCA/CIALIS) 20 MG tablet Take 20 mg by mouth as needed for erectile dysfunction.     ferrous sulfate 325 (65 FE) MG tablet Take 1 tablet (325 mg total) by mouth daily. 90 tablet 3   hydrALAZINE (APRESOLINE) 50 MG tablet TAKE 1 TABLET BY MOUTH 3  TIMES DAILY (Patient not taking: Reported on 10/16/2021) 270 tablet 3   No facility-administered medications prior to visit.    ROS: Review of Systems  Constitutional:  Positive for fatigue and unexpected weight change. Negative for appetite change.  HENT:  Negative for congestion, nosebleeds, sneezing, sore throat and trouble swallowing.   Eyes:  Negative for itching and visual disturbance.  Respiratory:  Negative for cough.   Cardiovascular:  Negative for chest pain,  palpitations and leg swelling.  Gastrointestinal:  Negative for abdominal distention, blood in stool, diarrhea and nausea.  Genitourinary:  Negative for frequency and hematuria.  Musculoskeletal:  Negative for back pain, gait problem, joint swelling and neck pain.  Skin:  Negative for rash.  Neurological:  Negative for dizziness, tremors, speech difficulty and weakness.  Psychiatric/Behavioral:  Negative for agitation, dysphoric mood and sleep disturbance. The patient is not nervous/anxious.     Objective:  BP 118/62 (BP Location: Left Arm, Patient Position: Sitting, Cuff Size: Normal)   Pulse (!) 54   Temp 98.4 F (36.9 C) (Oral)   Ht 5' 11"  (1.803 m)   Wt 171 lb (77.6 kg)   SpO2 97%   BMI 23.85 kg/m   BP Readings from Last  3 Encounters:  10/16/21 118/62  08/23/21 (!) 146/64  07/18/21 128/78    Wt Readings from Last 3 Encounters:  10/16/21 171 lb (77.6 kg)  08/23/21 174 lb (78.9 kg)  07/18/21 175 lb 9.6 oz (79.7 kg)    Physical Exam Constitutional:      General: He is not in acute distress.    Appearance: He is well-developed.     Comments: NAD  Eyes:     Conjunctiva/sclera: Conjunctivae normal.     Pupils: Pupils are equal, round, and reactive to light.  Neck:     Thyroid: No thyromegaly.     Vascular: No JVD.  Cardiovascular:     Rate and Rhythm: Normal rate and regular rhythm.     Heart sounds: Normal heart sounds. No murmur heard.    No friction rub. No gallop.  Pulmonary:     Effort: Pulmonary effort is normal. No respiratory distress.     Breath sounds: Normal breath sounds. No wheezing or rales.  Chest:     Chest wall: No tenderness.  Abdominal:     General: Bowel sounds are normal. There is no distension.     Palpations: Abdomen is soft. There is no mass.     Tenderness: There is no abdominal tenderness. There is no guarding or rebound.  Musculoskeletal:        General: No tenderness. Normal range of motion.     Cervical back: Normal range of motion.  Lymphadenopathy:     Cervical: No cervical adenopathy.  Skin:    General: Skin is warm and dry.     Findings: No rash.  Neurological:     Mental Status: He is alert and oriented to person, place, and time.     Cranial Nerves: No cranial nerve deficit.     Motor: No abnormal muscle tone.     Coordination: Coordination normal.     Gait: Gait normal.     Deep Tendon Reflexes: Reflexes are normal and symmetric.  Psychiatric:        Behavior: Behavior normal.        Thought Content: Thought content normal.        Judgment: Judgment normal.   Thin  Lab Results  Component Value Date   WBC 12.9 (H) 05/17/2021   HGB 9.2 (L) 05/17/2021   HCT 28.1 (L) 05/17/2021   PLT 202 05/17/2021   GLUCOSE 100 (H) 07/18/2021   CHOL 120  07/21/2019   TRIG 68.0 07/21/2019   HDL 31.70 (L) 07/21/2019   LDLCALC 75 07/21/2019   ALT 20 07/18/2021   AST 24 07/18/2021   NA 144 07/18/2021   K 3.5 07/18/2021   CL 105 07/18/2021   CREATININE 2.19 (H) 07/18/2021  BUN 34 (H) 07/18/2021   CO2 32 07/18/2021   TSH 0.48 05/03/2021   PSA 8.92 (H) 04/22/2019   INR 1.35 07/23/2015   HGBA1C 5.9 07/18/2021   MICROALBUR 38.7 (H) 11/24/2013    DG Chest 2 View  Result Date: 05/15/2021 CLINICAL DATA:  Fever. EXAM: CHEST - 2 VIEW COMPARISON:  Chest x-ray 11/29/2020. FINDINGS: Sternotomy wires are present. The aorta is tortuous. Cardiac silhouette is within normal limits. Lungs are clear. No pleural effusion or pneumothorax. No acute fractures. IMPRESSION: 1. No acute cardiopulmonary process. Electronically Signed   By: Ronney Asters M.D.   On: 05/15/2021 18:04    Assessment & Plan:   Problem List Items Addressed This Visit     Edema    No relapse On Torsemide Reduce Amlodipine to 5 mg/day      Essential hypertension    Reduce Amlodipine to 5 mg/day - low BP Cont on Coreg, Hydralazine, Torsemide      Relevant Medications   amLODipine (NORVASC) 10 MG tablet   Type 2 diabetes mellitus (A1c 6.0 on 05/03/2021) with steroid-induced hyperglycemia    Check A1c On Prandin      Weight loss    Due to MM, CRF Ensure qd          Meds ordered this encounter  Medications   amLODipine (NORVASC) 10 MG tablet    Sig: Take 0.5 tablets (5 mg total) by mouth daily.    Dispense:  90 tablet    Refill:  1      Follow-up: No follow-ups on file.  Walker Kehr, MD

## 2021-10-16 NOTE — Assessment & Plan Note (Signed)
No relapse On Torsemide Reduce Amlodipine to 5 mg/day

## 2021-10-16 NOTE — Assessment & Plan Note (Signed)
Check A1c On Prandin

## 2021-11-20 ENCOUNTER — Inpatient Hospital Stay: Payer: Medicare Other | Attending: Internal Medicine

## 2021-11-20 ENCOUNTER — Other Ambulatory Visit: Payer: Self-pay

## 2021-11-20 DIAGNOSIS — I5032 Chronic diastolic (congestive) heart failure: Secondary | ICD-10-CM | POA: Diagnosis not present

## 2021-11-20 DIAGNOSIS — N184 Chronic kidney disease, stage 4 (severe): Secondary | ICD-10-CM | POA: Insufficient documentation

## 2021-11-20 DIAGNOSIS — E1122 Type 2 diabetes mellitus with diabetic chronic kidney disease: Secondary | ICD-10-CM | POA: Insufficient documentation

## 2021-11-20 DIAGNOSIS — Z7982 Long term (current) use of aspirin: Secondary | ICD-10-CM | POA: Diagnosis not present

## 2021-11-20 DIAGNOSIS — E785 Hyperlipidemia, unspecified: Secondary | ICD-10-CM | POA: Diagnosis not present

## 2021-11-20 DIAGNOSIS — Z79899 Other long term (current) drug therapy: Secondary | ICD-10-CM | POA: Diagnosis not present

## 2021-11-20 DIAGNOSIS — D472 Monoclonal gammopathy: Secondary | ICD-10-CM | POA: Diagnosis not present

## 2021-11-20 DIAGNOSIS — G4730 Sleep apnea, unspecified: Secondary | ICD-10-CM | POA: Diagnosis not present

## 2021-11-20 DIAGNOSIS — I130 Hypertensive heart and chronic kidney disease with heart failure and stage 1 through stage 4 chronic kidney disease, or unspecified chronic kidney disease: Secondary | ICD-10-CM | POA: Diagnosis not present

## 2021-11-20 DIAGNOSIS — N400 Enlarged prostate without lower urinary tract symptoms: Secondary | ICD-10-CM | POA: Diagnosis not present

## 2021-11-20 LAB — CBC WITH DIFFERENTIAL/PLATELET
Abs Immature Granulocytes: 0.02 10*3/uL (ref 0.00–0.07)
Basophils Absolute: 0 10*3/uL (ref 0.0–0.1)
Basophils Relative: 1 %
Eosinophils Absolute: 0.4 10*3/uL (ref 0.0–0.5)
Eosinophils Relative: 7 %
HCT: 33.4 % — ABNORMAL LOW (ref 39.0–52.0)
Hemoglobin: 11.5 g/dL — ABNORMAL LOW (ref 13.0–17.0)
Immature Granulocytes: 0 %
Lymphocytes Relative: 26 %
Lymphs Abs: 1.4 10*3/uL (ref 0.7–4.0)
MCH: 29.1 pg (ref 26.0–34.0)
MCHC: 34.4 g/dL (ref 30.0–36.0)
MCV: 84.6 fL (ref 80.0–100.0)
Monocytes Absolute: 0.7 10*3/uL (ref 0.1–1.0)
Monocytes Relative: 12 %
Neutro Abs: 3.1 10*3/uL (ref 1.7–7.7)
Neutrophils Relative %: 54 %
Platelets: 213 10*3/uL (ref 150–400)
RBC: 3.95 MIL/uL — ABNORMAL LOW (ref 4.22–5.81)
RDW: 13.6 % (ref 11.5–15.5)
WBC: 5.6 10*3/uL (ref 4.0–10.5)
nRBC: 0 % (ref 0.0–0.2)

## 2021-11-20 LAB — CMP (CANCER CENTER ONLY)
ALT: 13 U/L (ref 0–44)
AST: 19 U/L (ref 15–41)
Albumin: 4.7 g/dL (ref 3.5–5.0)
Alkaline Phosphatase: 114 U/L (ref 38–126)
Anion gap: 5 (ref 5–15)
BUN: 30 mg/dL — ABNORMAL HIGH (ref 8–23)
CO2: 32 mmol/L (ref 22–32)
Calcium: 9.6 mg/dL (ref 8.9–10.3)
Chloride: 106 mmol/L (ref 98–111)
Creatinine: 2.24 mg/dL — ABNORMAL HIGH (ref 0.61–1.24)
GFR, Estimated: 29 mL/min — ABNORMAL LOW (ref >60)
Glucose, Bld: 78 mg/dL (ref 70–99)
Potassium: 4 mmol/L (ref 3.5–5.1)
Sodium: 143 mmol/L (ref 135–145)
Total Bilirubin: 0.7 mg/dL (ref 0.3–1.2)
Total Protein: 7.2 g/dL (ref 6.5–8.1)

## 2021-11-20 LAB — IRON AND IRON BINDING CAPACITY (CC-WL,HP ONLY)
Iron: 108 ug/dL (ref 45–182)
Saturation Ratios: 29 % (ref 17.9–39.5)
TIBC: 372 ug/dL (ref 250–450)
UIBC: 264 ug/dL (ref 117–376)

## 2021-11-21 LAB — FERRITIN: Ferritin: 78 ng/mL (ref 24–336)

## 2021-11-22 LAB — MULTIPLE MYELOMA PANEL, SERUM
Albumin SerPl Elph-Mcnc: 4.3 g/dL (ref 2.9–4.4)
Albumin/Glob SerPl: 1.8 — ABNORMAL HIGH (ref 0.7–1.7)
Alpha 1: 0.2 g/dL (ref 0.0–0.4)
Alpha2 Glob SerPl Elph-Mcnc: 0.6 g/dL (ref 0.4–1.0)
B-Globulin SerPl Elph-Mcnc: 0.9 g/dL (ref 0.7–1.3)
Gamma Glob SerPl Elph-Mcnc: 0.8 g/dL (ref 0.4–1.8)
Globulin, Total: 2.4 g/dL (ref 2.2–3.9)
IgA: 126 mg/dL (ref 61–437)
IgG (Immunoglobin G), Serum: 924 mg/dL (ref 603–1613)
IgM (Immunoglobulin M), Srm: 49 mg/dL (ref 15–143)
M Protein SerPl Elph-Mcnc: 0.4 g/dL — ABNORMAL HIGH
Total Protein ELP: 6.7 g/dL (ref 6.0–8.5)

## 2021-12-05 ENCOUNTER — Inpatient Hospital Stay (HOSPITAL_BASED_OUTPATIENT_CLINIC_OR_DEPARTMENT_OTHER): Payer: Medicare Other | Admitting: Hematology

## 2021-12-05 DIAGNOSIS — Z79899 Other long term (current) drug therapy: Secondary | ICD-10-CM | POA: Diagnosis not present

## 2021-12-05 DIAGNOSIS — D649 Anemia, unspecified: Secondary | ICD-10-CM

## 2021-12-05 DIAGNOSIS — N184 Chronic kidney disease, stage 4 (severe): Secondary | ICD-10-CM | POA: Diagnosis not present

## 2021-12-05 DIAGNOSIS — D472 Monoclonal gammopathy: Secondary | ICD-10-CM | POA: Diagnosis not present

## 2021-12-05 DIAGNOSIS — N400 Enlarged prostate without lower urinary tract symptoms: Secondary | ICD-10-CM | POA: Diagnosis not present

## 2021-12-05 DIAGNOSIS — I130 Hypertensive heart and chronic kidney disease with heart failure and stage 1 through stage 4 chronic kidney disease, or unspecified chronic kidney disease: Secondary | ICD-10-CM | POA: Diagnosis not present

## 2021-12-05 DIAGNOSIS — E785 Hyperlipidemia, unspecified: Secondary | ICD-10-CM | POA: Diagnosis not present

## 2021-12-05 DIAGNOSIS — E1122 Type 2 diabetes mellitus with diabetic chronic kidney disease: Secondary | ICD-10-CM | POA: Diagnosis not present

## 2021-12-05 DIAGNOSIS — Z7982 Long term (current) use of aspirin: Secondary | ICD-10-CM | POA: Diagnosis not present

## 2021-12-05 DIAGNOSIS — I5032 Chronic diastolic (congestive) heart failure: Secondary | ICD-10-CM | POA: Diagnosis not present

## 2021-12-05 DIAGNOSIS — G4730 Sleep apnea, unspecified: Secondary | ICD-10-CM | POA: Diagnosis not present

## 2021-12-05 NOTE — Progress Notes (Signed)
.   HEMATOLOGY/ONCOLOGY PHONE VISIT NOTE  Date of Service: 12/05/2021   Patient Care Team: Plotnikov, Aleksei V, MD as PCP - General Cooper, Michael, MD as PCP - Cardiology (Cardiology) Gross, Steven, MD as Consulting Physician (General Surgery) Mansouraty, Gabriel Jr., MD as Consulting Physician (Gastroenterology) Groat, Kimmie Scott, MD as Consulting Physician (Ophthalmology) Szabat, Daniel C, RPH (Inactive) as Pharmacist (Pharmacist)  CHIEF COMPLAINTS/PURPOSE OF CONSULTATION:  Follow-up to discuss work-up of monoclonal paraproteinemia  HISTORY OF PRESENTING ILLNESS:   Harold Pittman is a wonderful 81 y.o. male who has been referred to us by Dr Martin Webb, MD for evaluation and management of evaluation of monoclonal paraproteinemia.  Patient is accompanied by his family members for this visit.  Patient has a history of coronary artery disease status post CABG in 1997, hypertension, diabetes tobacco abuse, BPH, gout, CHF with diastolic dysfunction, pulmonary hypertension who was recently evaluated by nephrology for his acute on chronic kidney disease.  He was noted to have a creatinine of 1.7 in April 2021 which had bumped up to 3.3 in July 2022 and was back down to 2.8 mg/dL on his last nephrology visit. His urinary sediment was noted to be inactive with no white blood cells or red blood cells and no significant proteinuria on UA. As part of his renal work-up he had a renal ultrasound in May 2021 which showed 2 complex cysts in the right kidney the largest being 4.8 cm in size multiseptated cyst in the midpole.  MRI evaluation of the kidney was recommended to rule out neoplasm. Patient notes he has recently seen urology and has an MRI ordered which is scheduled for 03/31/2021.  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  As part of his nephrology work-up he had SPEP on 01/19/2021 which  showed M spike of 0.5 g/dL. IFE shows IgG monoclonal protein with kappa light chain specificity UPEP showed no M spike.  Patient notes no new focal bone pains. No fevers no chills no night sweats no unexpected weight loss.  INTERVAL HISTORY I connected with Harold Pittman on 12/05/2021 at 3:30 PM EST by telephone visit and verified that I am speaking with the correct person using two identifiers.   I discussed the limitations, risks, security and privacy concerns of performing an evaluation and management service by telemedicine and the availability of in-person appointments. I also discussed with the patient that there may be a patient responsible charge related to this service. The patient expressed understanding and agreed to proceed.   Other persons participating in the visit and their role in the encounter: None  Patient's location: Home Provider's location: [facility]  Harold Pittman is a 81 y.o. male who was contacted via phone to follow-up on work-up for his monoclonal paraproteinemia. He reports He is doing well with no new symptoms or concerns.  He notes he is eating well and drinking well.  He currently takes oral iron ferrous sulfate 1x per day.  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  He follows up with Dr. Eugene Bell from urology.  No other new or acute focal symptoms.  Labs done 11/20/2021 were reviewed in detail.  MEDICAL HISTORY:  Past Medical History:  Diagnosis Date   Anemia    low iron   Anxiety state, unspecified    Blood   in stool    Chronic airway obstruction, not elsewhere classified    Coronary artery disease    Coronary atherosclerosis of artery bypass graft    Dyspnea    ED (erectile dysfunction)    Esophageal reflux    Gout, unspecified    Osteoarthrosis, unspecified whether generalized or localized, unspecified site    Other and  unspecified hyperlipidemia    Other specified disorder of rectum and anus    Personal history of tobacco use, presenting hazards to health    Sleep apnea    does not use cpap   Type II or unspecified type diabetes mellitus without mention of complication, not stated as uncontrolled    Unspecified essential hypertension     SURGICAL HISTORY: Past Surgical History:  Procedure Laterality Date   BIOPSY  09/26/2017   Procedure: BIOPSY;  Surgeon: Stark, Malcolm T, MD;  Location: [facility];  Service: Endoscopy;;   BIOPSY  02/03/2018   Procedure: BIOPSY;  Surgeon: Mansouraty, Gabriel Jr., MD;  Location: [facility];  Service: Gastroenterology;;   COLONOSCOPY WITH PROPOFOL N/A 09/26/2017   Procedure: COLONOSCOPY WITH PROPOFOL;  Surgeon: Stark, Malcolm T, MD;  Location: [facility];  Service: Endoscopy;  Laterality: N/A;   CORONARY ARTERY BYPASS GRAFT  97   LIMA to LAD, sequential saphenous vein graft to the first and second diagonal, sequential saphenous vein graft to the intermediate OM and circumflex and SVG to RCA   ESOPHAGOGASTRODUODENOSCOPY (EGD) WITH PROPOFOL N/A 09/26/2017   Procedure: ESOPHAGOGASTRODUODENOSCOPY (EGD) WITH PROPOFOL;  Surgeon: Stark, Malcolm T, MD;  Location: [facility];  Service: Endoscopy;  Laterality: N/A;   ESOPHAGOGASTRODUODENOSCOPY (EGD) WITH PROPOFOL N/A 02/03/2018   Procedure: ESOPHAGOGASTRODUODENOSCOPY (EGD);  Surgeon: Mansouraty, Gabriel Jr., MD;  Location: [facility];  Service: Gastroenterology;  Laterality: N/A;   KNEE SURGERY     BILATERAL   POLYPECTOMY  09/26/2017   Procedure: POLYPECTOMY;  Surgeon: Stark, Malcolm T, MD;  Location: [facility];  Service: Endoscopy;;   ROTATOR CUFF REPAIR     SUBMUCOSAL INJECTION  02/03/2018   Procedure: SUBMUCOSAL INJECTION;  Surgeon: Mansouraty, Gabriel Jr., MD;  Location: [facility];  Service: Gastroenterology;;    SOCIAL HISTORY: Social History   Socioeconomic History   Marital status: Widowed    Spouse  name: Not on file   Number of children: Not on file   Years of education: Not on file   Highest education level: Not on file  Occupational History   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  Tobacco Use   Smoking status: Former   Smokeless tobacco: Never   Tobacco comments:    Stopped 1997  Vaping Use   Vaping Use: Never used  Substance and Sexual Activity   Alcohol use: No    Comment: Stopped 1997   Drug use: No   Sexual activity: Not Currently  Other Topics Concern   Not on file  Social History Narrative   Patient does not get regular exercise   Daily Caffeine use - 6      Wife decease 2007;       Social Determinants of Health   Financial Resource Strain: Not on file  Food Insecurity: Not on file  Transportation Needs: Not on file  Physical Activity: Not on file  Stress: Not on file  Social Connections: Not on file  Intimate Partner Violence: Unknown (01/11/2019)   Humiliation, Afraid, Rape, and Kick questionnaire    Fear of Current or Ex-Partner: No    Emotionally Abused:   Not on file    Physically Abused: Not on file    Sexually Abused: Not on file    FAMILY HISTORY: Family History  Problem Relation Age of Onset   Heart disease Mother 33       CAD   Mental illness Father        Alzheimer's   Depression Sister    Heart disease Sister    Stroke Son    Hypertension Other    Coronary artery disease Other        Male 1st degree relative <50   Colon cancer Neg Hx    Stomach cancer Neg Hx    Esophageal cancer Neg Hx    Liver disease Neg Hx    Pancreatic cancer Neg Hx    Rectal cancer Neg Hx     ALLERGIES:  is allergic to benazepril and gabapentin.  MEDICATIONS:  Current Outpatient Medications  Medication Sig Dispense Refill   amLODipine (NORVASC) 10 MG tablet Take 0.5 tablets (5 mg total) by mouth daily. 90 tablet 1   aspirin 81 MG EC tablet Take 1 tablet (81 mg total) by mouth daily. 100 tablet 3   Blood Glucose Monitoring Suppl  (ONETOUCH VERIO FLEX SYSTEM) w/Device KIT USE TO MONITOR BLOOD SUGAR 1 kit 0   carvedilol (COREG) 25 MG tablet Take 1 tablet (25 mg total) by mouth 2 (two) times daily with a meal. 180 tablet 3   Cholecalciferol (VITAMIN D3) 50 MCG (2000 UT) capsule Take 1 capsule (2,000 Units total) by mouth daily. 100 capsule 3   colchicine 0.6 MG tablet Take 1 tablet (0.6 mg total) by mouth daily. (Patient taking differently: Take 0.6 mg by mouth every other day.) 90 tablet 1   Cyanocobalamin (VITAMIN B-12) 1000 MCG SUBL Place 1 tablet (1,000 mcg total) under the tongue daily. 100 tablet 3   docusate sodium (COLACE) 100 MG capsule Take 1 capsule (100 mg total) by mouth daily as needed for mild constipation. 100 capsule 3   famotidine (PEPCID) 40 MG tablet Take 1 tablet (40 mg total) by mouth daily. 90 tablet 3   ferrous sulfate 325 (65 FE) MG tablet Take 1 tablet (325 mg total) by mouth daily. 90 tablet 3   glucose blood (ONETOUCH VERIO) test strip CHECK BLOOD SUGAR 1 TO 2  TIMES DAILY AS DIRECTED 200 strip 3   HYDROcodone-acetaminophen (NORCO) 5-325 MG tablet Take 1 tablet by mouth every 6 (six) hours as needed for moderate pain. 60 tablet 0   Lancets (ONETOUCH DELICA PLUS [id number]) MISC USE AS DIRECTED 200 each 3   omeprazole (PRILOSEC) 40 MG capsule Take 1 capsule (40 mg total) by mouth daily. 90 capsule 3   ondansetron (ZOFRAN) 4 MG tablet Take 1 tablet (4 mg total) by mouth every 8 (eight) hours as needed for nausea or vomiting. 21 tablet 1   potassium chloride (KLOR-CON) 10 MEQ tablet TAKE 1 TABLET(10 MEQ) BY MOUTH DAILY 7 tablet 0   pravastatin (PRAVACHOL) 80 MG tablet Take 1 tablet (80 mg total) by mouth at bedtime. 90 tablet 3   repaglinide (PRANDIN) 1 MG tablet 1 po before or with dinner 90 tablet 3   torsemide (DEMADEX) 100 MG tablet TAKE 1 TABLET BY MOUTH DAILY 90 tablet 3   No current facility-administered medications for this visit.    REVIEW OF SYSTEMS:   .10 Point review of Systems was done  is negative except as noted above.   PHYSICAL EXAMINATION: Telemedicine appointment  LABORATORY DATA:  I have reviewed the data as listed  .    Latest Ref Rng & Units 11/20/2021    1:25 PM 05/17/2021    3:32 AM 05/16/2021    7:39 AM  CBC  WBC 4.0 - 10.5 K/uL 5.6  12.9  10.2   Hemoglobin 13.0 - 17.0 g/dL 11.5  9.2  10.1   Hematocrit 39.0 - 52.0 % 33.4  28.1  30.7   Platelets 150 - 400 K/uL 213  202  208     .    Latest Ref Rng & Units 11/20/2021    1:25 PM 07/18/2021    2:49 PM 05/18/2021    7:19 AM  CMP  Glucose 70 - 99 mg/dL 78  100  165   BUN 8 - 23 mg/dL 30  34  72   Creatinine 0.61 - 1.24 mg/dL 2.24  2.19  3.04   Sodium 135 - 145 mmol/L 143  144  138   Potassium 3.5 - 5.1 mmol/L 4.0  3.5  4.1   Chloride 98 - 111 mmol/L 106  105  102   CO2 22 - 32 mmol/L 32  32  25   Calcium 8.9 - 10.3 mg/dL 9.6  9.5  9.2   Total Protein 6.5 - 8.1 g/dL 7.2  6.9    Total Bilirubin 0.3 - 1.2 mg/dL 0.7  0.6    Alkaline Phos 38 - 126 U/L 114  96    AST 15 - 41 U/L 19  24    ALT 0 - 44 U/L 13  20     Component     Latest Ref Rng & Units 03/13/2021 03/16/2021  IgG (Immunoglobin G), Serum     603 - 1,613 mg/dL 975   IgA     61 - 437 mg/dL 126   IgM (Immunoglobulin M), Srm     15 - 143 mg/dL 53   Total Protein ELP     6.0 - 8.5 g/dL 6.8   Albumin SerPl Elph-Mcnc     2.9 - 4.4 g/dL 4.2   Alpha 1     0.0 - 0.4 g/dL 0.2   Alpha2 Glob SerPl Elph-Mcnc     0.4 - 1.0 g/dL 0.7   B-Globulin SerPl Elph-Mcnc     0.7 - 1.3 g/dL 0.9   Gamma Glob SerPl Elph-Mcnc     0.4 - 1.8 g/dL 0.8   M Protein SerPl Elph-Mcnc     Not Observed g/dL 0.4 (H)   Globulin, Total     2.2 - 3.9 g/dL 2.6   Albumin/Glob SerPl     0.7 - 1.7 1.7   IFE 1      Comment (A)   Please Note (HCV):      Comment   Total Protein, Urine-UPE24     Not Estab. mg/dL  7.9  Total Protein, Urine-Ur/day     30 - 150 mg/24 hr  174 (H)  ALBUMIN, U     %  100.0  ALPHA 1 URINE     %  0.0  Alpha 2, Urine     %  0.0  % BETA,  Urine     %  0.0  GAMMA GLOBULIN URINE     %  0.0  Free Kappa Lt Chains,Ur     1.17 - 86.46 mg/L  42.99  Free Lambda Lt Chains,Ur     0.27 - 15.21 mg/L  8.86  Free Kappa/Lambda Ratio     1.83 - 14.26    4.85  Immunofixation Result, Urine       Comment  Total Volume       2,200  M-SPIKE %, Urine     Not Observed %  Not Observed  NOTE:       Comment  Kappa free light chain     3.3 - 19.4 mg/L 58.7 (H)   Lambda free light chains     5.7 - 26.3 mg/L 36.7 (H)   Kappa, lambda light chain ratio     0.26 - 1.65 1.60   Beta-2 Microglobulin     0.6 - 2.4 mg/L 7.3 (H)   LDH     98 - 192 U/L 215 (H)   Sed Rate     0 - 16 mm/hr 40 (H)      RADIOGRAPHIC STUDIES: I have personally reviewed the radiological images as listed and agreed with the findings in the report. No results found.  ASSESSMENT & PLAN:   81 year old male with  #1  IgG kappa monoclonal paraproteinemia Patient has an M spike of 0.5 g/dL on 01/19/2021. This was found in the context of work-up for chronic kidney disease. Patient does not have significant proteinuria as per urine analysis and so overt renal amyloidosis is less likely based on the time course of his chronic kidney disease and lack of proteinuria. Also patient has multiple other medical comorbidities that explain his chronic kidney disease including hypertension, diabetes, cardiorenal syndrome, gout with possibility of uric acid nephropathy, renal cysts etc.. PLAN -Patient's lab results from 11/20/2021 were discussed in detail.  His M spike is only at 0.4 g/dL and has not increased over the last couple of months. -CBC and CMP remain stable. -Mild stable anemia related to chronic kidney disease. 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 -At this time the patient would like to hold off on  invasive work-up with a bone marrow biopsy and prefers to monitor this with labs.  #2 chronic normocytic anemia patient's hemoglobin is 11.5 which has improved from 9.2 in previous labs. 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 PLAN -Follow-up with nephrology and consider IV iron replacement to maintain ferritin more than 250 and iron saturation more than 30%. -ESA's if hemoglobin less than 9 despite optimization of iron and other vitamin replacement. -Iron labs WNL with iron sat ratio of 29% and Ferritin of 78.  #3 complex renal cysts bilaterally rule out neoplasm  Follow-up Phone visit with Dr Irene Limbo in 12 months Labs 1 week prior to phone visit   .The total time spent in the appointment was 14 minutes* .  All of the patient's questions were answered with apparent satisfaction. The patient knows to call the clinic with any problems, questions or concerns.   Sullivan Lone MD MS AAHIVMS [facility] Hematology/Oncology Physician [facility]  .*Total Encounter Time as defined by the Centers for Medicare and Medicaid Services includes, in addition to the face-to-face time of a patient visit (documented in the note above) non-face-to-face time: obtaining and reviewing outside history, ordering and reviewing medications, tests or procedures, care coordination (communications with other health care professionals or caregivers) and documentation in the medical record.  I, Melene Muller, am acting as scribe for Dr. Sullivan Lone, MD.  .I have reviewed the above documentation for accuracy and completeness, and I agree with the above. Cloria Spring  Irene Limbo MD

## 2021-12-06 ENCOUNTER — Telehealth: Payer: Self-pay | Admitting: Hematology

## 2021-12-06 NOTE — Telephone Encounter (Signed)
Scheduled follow-up appointments per 8/29 los. Patient's son is aware.

## 2021-12-08 ENCOUNTER — Other Ambulatory Visit: Payer: Self-pay | Admitting: Internal Medicine

## 2022-01-15 ENCOUNTER — Ambulatory Visit (INDEPENDENT_AMBULATORY_CARE_PROVIDER_SITE_OTHER): Payer: Medicare Other | Admitting: Internal Medicine

## 2022-01-15 ENCOUNTER — Encounter: Payer: Self-pay | Admitting: Internal Medicine

## 2022-01-15 VITALS — BP 134/68 | HR 54 | Temp 98.2°F | Ht 71.0 in | Wt 173.8 lb

## 2022-01-15 DIAGNOSIS — M1000 Idiopathic gout, unspecified site: Secondary | ICD-10-CM

## 2022-01-15 DIAGNOSIS — R270 Ataxia, unspecified: Secondary | ICD-10-CM

## 2022-01-15 DIAGNOSIS — N184 Chronic kidney disease, stage 4 (severe): Secondary | ICD-10-CM

## 2022-01-15 DIAGNOSIS — E1159 Type 2 diabetes mellitus with other circulatory complications: Secondary | ICD-10-CM

## 2022-01-15 DIAGNOSIS — G603 Idiopathic progressive neuropathy: Secondary | ICD-10-CM | POA: Diagnosis not present

## 2022-01-15 DIAGNOSIS — G629 Polyneuropathy, unspecified: Secondary | ICD-10-CM | POA: Insufficient documentation

## 2022-01-15 DIAGNOSIS — G5601 Carpal tunnel syndrome, right upper limb: Secondary | ICD-10-CM

## 2022-01-15 MED ORDER — B COMPLEX PLUS PO TABS: 1.0000 | ORAL_TABLET | Freq: Every day | ORAL | 3 refills | Status: AC

## 2022-01-15 MED ORDER — HYDROCODONE-ACETAMINOPHEN 5-325 MG PO TABS: 0.5000 | ORAL_TABLET | Freq: Four times a day (QID) | ORAL | 0 refills | Status: DC | PRN

## 2022-01-15 MED ORDER — B COMPLEX PLUS PO TABS: 1.0000 | ORAL_TABLET | Freq: Every day | ORAL | 3 refills | Status: DC

## 2022-01-15 NOTE — Assessment & Plan Note (Signed)
Start PT at home

## 2022-01-15 NOTE — Progress Notes (Signed)
Subjective:  Patient ID: Harold Pittman, male    DOB: 31-Jul-1940  Age: 81 y.o. MRN: [id number]  CC: Follow-up (3 month f/u)   HPI Harold Pittman presents for HTN, CHF C/o numb and cold legs, hands He is here with his son  Outpatient Medications Prior to Visit  Medication Sig Dispense Refill   amLODipine (NORVASC) 10 MG tablet Take 0.5 tablets (5 mg total) by mouth daily. 90 tablet 1   aspirin 81 MG EC tablet Take 1 tablet (81 mg total) by mouth daily. 100 tablet 3   Blood Glucose Monitoring Suppl (ONETOUCH VERIO FLEX SYSTEM) w/Device KIT USE TO MONITOR BLOOD SUGAR 1 kit 0   carvedilol (COREG) 25 MG tablet Take 1 tablet (25 mg total) by mouth 2 (two) times daily with a meal. 180 tablet 3   Cholecalciferol (VITAMIN D3) 50 MCG (2000 UT) capsule Take 1 capsule (2,000 Units total) by mouth daily. 100 capsule 3   colchicine 0.6 MG tablet TAKE 1 TABLET(0.6 MG) BY MOUTH DAILY 90 tablet 1   Cyanocobalamin (VITAMIN B-12) 1000 MCG SUBL Place 1 tablet (1,000 mcg total) under the tongue daily. 100 tablet 3   docusate sodium (COLACE) 100 MG capsule Take 1 capsule (100 mg total) by mouth daily as needed for mild constipation. 100 capsule 3   famotidine (PEPCID) 40 MG tablet Take 1 tablet (40 mg total) by mouth daily. 90 tablet 3   glucose blood (ONETOUCH VERIO) test strip CHECK BLOOD SUGAR 1 TO 2  TIMES DAILY AS DIRECTED 200 strip 3   Lancets (ONETOUCH DELICA PLUS [id number]) MISC USE AS DIRECTED 200 each 3   omeprazole (PRILOSEC) 40 MG capsule Take 1 capsule (40 mg total) by mouth daily. 90 capsule 3   ondansetron (ZOFRAN) 4 MG tablet Take 1 tablet (4 mg total) by mouth every 8 (eight) hours as needed for nausea or vomiting. 21 tablet 1   potassium chloride (KLOR-CON) 10 MEQ tablet TAKE 1 TABLET(10 MEQ) BY MOUTH DAILY 7 tablet 0   pravastatin (PRAVACHOL) 80 MG tablet Take 1 tablet (80 mg total) by mouth at bedtime. 90 tablet 3   repaglinide (PRANDIN) 1 MG tablet 1 po before or with dinner 90 tablet 3    torsemide (DEMADEX) 100 MG tablet TAKE 1 TABLET BY MOUTH DAILY 90 tablet 3   HYDROcodone-acetaminophen (NORCO) 5-325 MG tablet Take 1 tablet by mouth every 6 (six) hours as needed for moderate pain. 60 tablet 0   ferrous sulfate 325 (65 FE) MG tablet Take 1 tablet (325 mg total) by mouth daily. 90 tablet 3   No facility-administered medications prior to visit.    ROS: Review of Systems  Constitutional:  Negative for appetite change, fatigue and unexpected weight change.  HENT:  Negative for congestion, nosebleeds, sneezing, sore throat and trouble swallowing.   Eyes:  Negative for itching and visual disturbance.  Respiratory:  Negative for cough.   Cardiovascular:  Negative for chest pain, palpitations and leg swelling.  Gastrointestinal:  Negative for abdominal distention, blood in stool, diarrhea and nausea.  Genitourinary:  Negative for frequency and hematuria.  Musculoskeletal:  Positive for arthralgias and gait problem. Negative for back pain, joint swelling and neck pain.  Skin:  Negative for rash.  Neurological:  Positive for weakness and numbness. Negative for dizziness, tremors and speech difficulty.  Hematological:  Does not bruise/bleed easily.  Psychiatric/Behavioral:  Negative for agitation, decreased concentration, dysphoric mood, sleep disturbance and suicidal ideas. The patient is not nervous/anxious.  Objective:  BP 134/68 (BP Location: Left Arm)   Pulse (!) 54   Temp 98.2 F (36.8 C) (Oral)   Ht 5' 11"  (1.803 m)   Wt 173 lb 12.8 oz (78.8 kg)   SpO2 97%   BMI 24.24 kg/m   BP Readings from Last 3 Encounters:  01/15/22 134/68  10/16/21 118/62  08/23/21 (!) 146/64    Wt Readings from Last 3 Encounters:  01/15/22 173 lb 12.8 oz (78.8 kg)  10/16/21 171 lb (77.6 kg)  08/23/21 174 lb (78.9 kg)    Physical Exam Constitutional:      General: He is not in acute distress.    Appearance: Normal appearance. He is well-developed.     Comments: NAD  HENT:      Nose: No congestion.  Eyes:     Conjunctiva/sclera: Conjunctivae normal.     Pupils: Pupils are equal, round, and reactive to light.  Neck:     Thyroid: No thyromegaly.     Vascular: No JVD.  Cardiovascular:     Rate and Rhythm: Normal rate and regular rhythm.     Heart sounds: Normal heart sounds. No murmur heard.    No friction rub. No gallop.  Pulmonary:     Effort: Pulmonary effort is normal. No respiratory distress.     Breath sounds: Normal breath sounds. No wheezing or rales.  Chest:     Chest wall: No tenderness.  Abdominal:     General: Bowel sounds are normal. There is no distension.     Palpations: Abdomen is soft. There is no mass.     Tenderness: There is no abdominal tenderness. There is no guarding or rebound.  Musculoskeletal:        General: No tenderness. Normal range of motion.     Cervical back: Normal range of motion.  Lymphadenopathy:     Cervical: No cervical adenopathy.  Skin:    General: Skin is warm and dry.     Findings: No rash.  Neurological:     Mental Status: He is alert and oriented to person, place, and time.     Cranial Nerves: No cranial nerve deficit.     Motor: Weakness present. No abnormal muscle tone.     Coordination: Coordination normal.     Gait: Gait abnormal.     Deep Tendon Reflexes: Reflexes are normal and symmetric.  Psychiatric:        Behavior: Behavior normal.        Thought Content: Thought content normal.        Judgment: Judgment normal.  Ataxic CTS signs (-) B Using a cane  Lab Results  Component Value Date   WBC 5.6 11/20/2021   HGB 11.5 (L) 11/20/2021   HCT 33.4 (L) 11/20/2021   PLT 213 11/20/2021   GLUCOSE 78 11/20/2021   CHOL 120 07/21/2019   TRIG 68.0 07/21/2019   HDL 31.70 (L) 07/21/2019   LDLCALC 75 07/21/2019   ALT 13 11/20/2021   AST 19 11/20/2021   NA 143 11/20/2021   K 4.0 11/20/2021   CL 106 11/20/2021   CREATININE 2.24 (H) 11/20/2021   BUN 30 (H) 11/20/2021   CO2 32 11/20/2021   TSH 0.48  05/03/2021   PSA 8.92 (H) 04/22/2019   INR 1.35 07/23/2015   HGBA1C 5.9 07/18/2021   MICROALBUR 38.7 (H) 11/24/2013    DG Chest 2 View  Result Date: 05/15/2021 CLINICAL DATA:  Fever. EXAM: CHEST - 2 VIEW COMPARISON:  Chest x-ray 11/29/2020.  FINDINGS: Sternotomy wires are present. The aorta is tortuous. Cardiac silhouette is within normal limits. Lungs are clear. No pleural effusion or pneumothorax. No acute fractures. IMPRESSION: 1. No acute cardiopulmonary process. Electronically Signed   By: Ronney Asters M.D.   On: 05/15/2021 18:04    Assessment & Plan:   Problem List Items Addressed This Visit     Ataxia - Primary    Start PT at home      Relevant Orders   Ambulatory referral to [facility]   CKD (chronic kidney disease) stage 4, GFR 15-29 ml/min (HCC)    Hydrate well Monitor GFR      Relevant Orders   Comprehensive metabolic panel   CBC with Differential/Platelet   TSH   T4, free   CTS (carpal tunnel syndrome)    Chronic, symptomatic Gabapentin intolerant Low dose Norco prn  Potential benefits of a long term opioids use as well as potential risks (i.e. addiction risk, apnea etc) and complications (i.e. Somnolence, constipation and others) were explained to the patient and were aknowledged. Take B complex      Gout    No relapse Cont on Colchicine, Medrol pack prn      Relevant Orders   Comprehensive metabolic panel   CBC with Differential/Platelet   TSH   T4, free   Peripheral neuropathy    Chronic, symptomatic Gabapentin intolerant Low dose Norco prn  Potential benefits of a long term opioids use as well as potential risks (i.e. addiction risk, apnea etc) and complications (i.e. Somnolence, constipation and others) were explained to the patient and were aknowledged. Take B complex Start PT at home      Relevant Orders   Comprehensive metabolic panel   CBC with Differential/Platelet   TSH   T4, free   Ambulatory referral to [facility]   Type 2  diabetes mellitus (A1c 6.0 on 05/03/2021) with steroid-induced hyperglycemia    Check A1c Cont on Prandin      Relevant Orders   Comprehensive metabolic panel   CBC with Differential/Platelet   TSH   T4, free   Hemoglobin A1c      Meds ordered this encounter  Medications   DISCONTD: B Complex-Folic Acid (B COMPLEX PLUS) TABS    Sig: Take 1 tablet by mouth daily.    Dispense:  100 tablet    Refill:  3   HYDROcodone-acetaminophen (NORCO) 5-325 MG tablet    Sig: Take 0.5-1 tablets by mouth every 6 (six) hours as needed for severe pain.    Dispense:  20 tablet    Refill:  0   B Complex-Folic Acid (B COMPLEX PLUS) TABS    Sig: Take 1 tablet by mouth daily.    Dispense:  100 tablet    Refill:  3      Follow-up: Return in about 3 months (around 04/17/2022) for a follow-up visit.  Walker Kehr, MD

## 2022-01-15 NOTE — Assessment & Plan Note (Signed)
Hydrate well ?Monitor GFR ?

## 2022-01-15 NOTE — Assessment & Plan Note (Addendum)
Chronic, symptomatic Gabapentin intolerant Low dose Norco prn  Potential benefits of a long term opioids use as well as potential risks (i.e. addiction risk, apnea etc) and complications (i.e. Somnolence, constipation and others) were explained to the patient and were aknowledged. Take B complex Start PT at home

## 2022-01-15 NOTE — Assessment & Plan Note (Signed)
Chronic, symptomatic Gabapentin intolerant Low dose Norco prn  Potential benefits of a long term opioids use as well as potential risks (i.e. addiction risk, apnea etc) and complications (i.e. Somnolence, constipation and others) were explained to the patient and were aknowledged. Take B complex

## 2022-01-15 NOTE — Assessment & Plan Note (Signed)
No relapse Cont on Colchicine, Medrol pack prn

## 2022-01-15 NOTE — Assessment & Plan Note (Signed)
Check A1c Cont on Prandin

## 2022-01-18 ENCOUNTER — Telehealth: Payer: Self-pay | Admitting: Internal Medicine

## 2022-01-18 NOTE — Telephone Encounter (Signed)
Notified pt daughter MD is wanting him to do the B-complex plus that was sent to pharmacy yesterday,,/lmb

## 2022-01-18 NOTE — Telephone Encounter (Signed)
Patients daughter called in and said that Dr Alain Marion prescribed B Complex-Folic Acid (B COMPLEX PLUS) TABS, She said he had already been taking B12 and she wasn't sure if he needs to stop one or if he needs to take together. Please advise. Call back is [phone]

## 2022-01-24 ENCOUNTER — Telehealth: Payer: Self-pay

## 2022-01-24 DIAGNOSIS — I2510 Atherosclerotic heart disease of native coronary artery without angina pectoris: Secondary | ICD-10-CM | POA: Diagnosis not present

## 2022-01-24 DIAGNOSIS — G5601 Carpal tunnel syndrome, right upper limb: Secondary | ICD-10-CM | POA: Diagnosis not present

## 2022-01-24 DIAGNOSIS — R270 Ataxia, unspecified: Secondary | ICD-10-CM | POA: Diagnosis not present

## 2022-01-24 DIAGNOSIS — E1151 Type 2 diabetes mellitus with diabetic peripheral angiopathy without gangrene: Secondary | ICD-10-CM | POA: Diagnosis not present

## 2022-01-24 DIAGNOSIS — M1000 Idiopathic gout, unspecified site: Secondary | ICD-10-CM | POA: Diagnosis not present

## 2022-01-24 DIAGNOSIS — N184 Chronic kidney disease, stage 4 (severe): Secondary | ICD-10-CM | POA: Diagnosis not present

## 2022-01-24 DIAGNOSIS — D509 Iron deficiency anemia, unspecified: Secondary | ICD-10-CM | POA: Diagnosis not present

## 2022-01-24 DIAGNOSIS — I130 Hypertensive heart and chronic kidney disease with heart failure and stage 1 through stage 4 chronic kidney disease, or unspecified chronic kidney disease: Secondary | ICD-10-CM | POA: Diagnosis not present

## 2022-01-24 DIAGNOSIS — E1122 Type 2 diabetes mellitus with diabetic chronic kidney disease: Secondary | ICD-10-CM | POA: Diagnosis not present

## 2022-01-24 DIAGNOSIS — E1142 Type 2 diabetes mellitus with diabetic polyneuropathy: Secondary | ICD-10-CM | POA: Diagnosis not present

## 2022-01-24 DIAGNOSIS — I5030 Unspecified diastolic (congestive) heart failure: Secondary | ICD-10-CM | POA: Diagnosis not present

## 2022-01-24 DIAGNOSIS — G4700 Insomnia, unspecified: Secondary | ICD-10-CM | POA: Diagnosis not present

## 2022-01-24 DIAGNOSIS — F419 Anxiety disorder, unspecified: Secondary | ICD-10-CM | POA: Diagnosis not present

## 2022-01-24 DIAGNOSIS — J449 Chronic obstructive pulmonary disease, unspecified: Secondary | ICD-10-CM | POA: Diagnosis not present

## 2022-01-24 DIAGNOSIS — H9190 Unspecified hearing loss, unspecified ear: Secondary | ICD-10-CM | POA: Diagnosis not present

## 2022-01-24 DIAGNOSIS — D125 Benign neoplasm of sigmoid colon: Secondary | ICD-10-CM | POA: Diagnosis not present

## 2022-01-24 NOTE — Telephone Encounter (Signed)
Notified Soni ok w/ verbal. Pt is up-to-date w/ appts.Marland KitchenJohny Chess

## 2022-01-24 NOTE — Telephone Encounter (Signed)
Home Health verbal orders  Agency Name: [facility]   Requesting PT  Frequency: 1W7   Please forward to [facility] pool or providers CMA

## 2022-01-25 ENCOUNTER — Observation Stay (HOSPITAL_COMMUNITY)
Admission: EM | Admit: 2022-01-25 | Discharge: 2022-01-31 | Disposition: A | Discharge status: Skilled Nursing Facility | Payer: Medicare Other | Attending: Internal Medicine | Admitting: Internal Medicine

## 2022-01-25 ENCOUNTER — Emergency Department (HOSPITAL_COMMUNITY): Payer: Medicare Other

## 2022-01-25 ENCOUNTER — Encounter (HOSPITAL_COMMUNITY): Payer: Self-pay | Admitting: Emergency Medicine

## 2022-01-25 ENCOUNTER — Telehealth: Payer: Self-pay | Admitting: Internal Medicine

## 2022-01-25 DIAGNOSIS — Z7409 Other reduced mobility: Secondary | ICD-10-CM | POA: Diagnosis not present

## 2022-01-25 DIAGNOSIS — I130 Hypertensive heart and chronic kidney disease with heart failure and stage 1 through stage 4 chronic kidney disease, or unspecified chronic kidney disease: Secondary | ICD-10-CM | POA: Insufficient documentation

## 2022-01-25 DIAGNOSIS — I700 Atherosclerosis of aorta: Secondary | ICD-10-CM | POA: Diagnosis not present

## 2022-01-25 DIAGNOSIS — I2510 Atherosclerotic heart disease of native coronary artery without angina pectoris: Secondary | ICD-10-CM | POA: Insufficient documentation

## 2022-01-25 DIAGNOSIS — R079 Chest pain, unspecified: Secondary | ICD-10-CM | POA: Diagnosis not present

## 2022-01-25 DIAGNOSIS — R2681 Unsteadiness on feet: Secondary | ICD-10-CM | POA: Insufficient documentation

## 2022-01-25 DIAGNOSIS — Z79899 Other long term (current) drug therapy: Secondary | ICD-10-CM | POA: Diagnosis not present

## 2022-01-25 DIAGNOSIS — I5030 Unspecified diastolic (congestive) heart failure: Secondary | ICD-10-CM | POA: Diagnosis present

## 2022-01-25 DIAGNOSIS — Z87891 Personal history of nicotine dependence: Secondary | ICD-10-CM | POA: Diagnosis not present

## 2022-01-25 DIAGNOSIS — M109 Gout, unspecified: Principal | ICD-10-CM | POA: Insufficient documentation

## 2022-01-25 DIAGNOSIS — E1151 Type 2 diabetes mellitus with diabetic peripheral angiopathy without gangrene: Secondary | ICD-10-CM | POA: Diagnosis present

## 2022-01-25 DIAGNOSIS — Z7982 Long term (current) use of aspirin: Secondary | ICD-10-CM | POA: Diagnosis not present

## 2022-01-25 DIAGNOSIS — E1122 Type 2 diabetes mellitus with diabetic chronic kidney disease: Secondary | ICD-10-CM | POA: Insufficient documentation

## 2022-01-25 DIAGNOSIS — M6281 Muscle weakness (generalized): Secondary | ICD-10-CM | POA: Diagnosis not present

## 2022-01-25 DIAGNOSIS — N184 Chronic kidney disease, stage 4 (severe): Secondary | ICD-10-CM | POA: Insufficient documentation

## 2022-01-25 DIAGNOSIS — E871 Hypo-osmolality and hyponatremia: Secondary | ICD-10-CM | POA: Diagnosis not present

## 2022-01-25 DIAGNOSIS — E876 Hypokalemia: Secondary | ICD-10-CM | POA: Insufficient documentation

## 2022-01-25 DIAGNOSIS — Z794 Long term (current) use of insulin: Secondary | ICD-10-CM | POA: Diagnosis not present

## 2022-01-25 DIAGNOSIS — R1030 Lower abdominal pain, unspecified: Secondary | ICD-10-CM | POA: Diagnosis not present

## 2022-01-25 DIAGNOSIS — R109 Unspecified abdominal pain: Secondary | ICD-10-CM | POA: Diagnosis not present

## 2022-01-25 DIAGNOSIS — I5032 Chronic diastolic (congestive) heart failure: Secondary | ICD-10-CM | POA: Diagnosis not present

## 2022-01-25 DIAGNOSIS — Z1152 Encounter for screening for COVID-19: Secondary | ICD-10-CM | POA: Diagnosis not present

## 2022-01-25 DIAGNOSIS — Z951 Presence of aortocoronary bypass graft: Secondary | ICD-10-CM | POA: Insufficient documentation

## 2022-01-25 DIAGNOSIS — D72829 Elevated white blood cell count, unspecified: Secondary | ICD-10-CM | POA: Diagnosis not present

## 2022-01-25 DIAGNOSIS — J439 Emphysema, unspecified: Secondary | ICD-10-CM | POA: Diagnosis not present

## 2022-01-25 DIAGNOSIS — R509 Fever, unspecified: Secondary | ICD-10-CM | POA: Diagnosis present

## 2022-01-25 DIAGNOSIS — I10 Essential (primary) hypertension: Secondary | ICD-10-CM | POA: Diagnosis present

## 2022-01-25 DIAGNOSIS — R2689 Other abnormalities of gait and mobility: Secondary | ICD-10-CM | POA: Diagnosis not present

## 2022-01-25 LAB — CBC
HCT: 32.9 % — ABNORMAL LOW (ref 39.0–52.0)
Hemoglobin: 11.3 g/dL — ABNORMAL LOW (ref 13.0–17.0)
MCH: 29 pg (ref 26.0–34.0)
MCHC: 34.3 g/dL (ref 30.0–36.0)
MCV: 84.6 fL (ref 80.0–100.0)
Platelets: 286 10*3/uL (ref 150–400)
RBC: 3.89 MIL/uL — ABNORMAL LOW (ref 4.22–5.81)
RDW: 13.2 % (ref 11.5–15.5)
WBC: 9 10*3/uL (ref 4.0–10.5)
nRBC: 0 % (ref 0.0–0.2)

## 2022-01-25 LAB — TROPONIN I (HIGH SENSITIVITY)
Troponin I (High Sensitivity): 21 ng/L — ABNORMAL HIGH (ref <18)
Troponin I (High Sensitivity): 21 ng/L — ABNORMAL HIGH (ref <18)

## 2022-01-25 LAB — COMPREHENSIVE METABOLIC PANEL
ALT: 13 U/L (ref 0–44)
AST: 20 U/L (ref 15–41)
Albumin: 4.1 g/dL (ref 3.5–5.0)
Alkaline Phosphatase: 84 U/L (ref 38–126)
Anion gap: 16 — ABNORMAL HIGH (ref 5–15)
BUN: 33 mg/dL — ABNORMAL HIGH (ref 8–23)
CO2: 25 mmol/L (ref 22–32)
Calcium: 10 mg/dL (ref 8.9–10.3)
Chloride: 100 mmol/L (ref 98–111)
Creatinine, Ser: 2.56 mg/dL — ABNORMAL HIGH (ref 0.61–1.24)
GFR, Estimated: 25 mL/min — ABNORMAL LOW (ref >60)
Glucose, Bld: 111 mg/dL — ABNORMAL HIGH (ref 70–99)
Potassium: 3.7 mmol/L (ref 3.5–5.1)
Sodium: 141 mmol/L (ref 135–145)
Total Bilirubin: 1.1 mg/dL (ref 0.3–1.2)
Total Protein: 7.4 g/dL (ref 6.5–8.1)

## 2022-01-25 NOTE — Telephone Encounter (Signed)
Called pt back spoke w/ son Charlann Lange) he states we ended up taking him to the ER at [facility]. EMS came out his vitals was good, but he was c/o of back pain. They have him in the back during labs and xrays he states. Inform son if he gets out before appt in the am can come, or call to f/u w/ Plot for next week.Marland KitchenJohny Chess

## 2022-01-25 NOTE — Telephone Encounter (Signed)
PT calls today in regards to onset of pain around the body, radiating from back and chest. PT stated this had been going on since they had received their Covid booster and Flu shot last week. I was able to get PT set up with an appointment with Harland Dingwall NP for tomorrow and PT requested to speak with a nurse. Was able to get them connected with Triage.

## 2022-01-25 NOTE — ED Triage Notes (Signed)
Pt endorses CP since getting his covid and flu shot within the past few weeks.

## 2022-01-25 NOTE — ED Provider Triage Note (Signed)
Emergency Medicine Provider Triage Evaluation Note  Harold Pittman , a 81 y.o. male  was evaluated in triage.  Pt complains of chest pain and back pain which been ongoing for 3 to 4 days.  Patient describes pain as wrapping around his chest.  He rates the pain as 10 out of 10 in severity.  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  He denies radiation of symptoms.  Denies shortness of breath at this time.  Complains of severe pain, tearing sensation.  Review of Systems  Positive: As above Negative: As above  Physical Exam  BP 124/73 (BP Location: Left Arm)   Pulse 70   Temp 99.1 F (37.3 C) (Oral)   Resp 16   SpO2 98%  Gen:   Awake, patient appears uncomfortable Resp:  Normal effort  MSK:   Moves extremities without difficulty  Other:    Medical Decision Making  Medically screening exam initiated at 3:10 PM.  Appropriate orders placed.  Harold Pittman was informed that the remainder of the evaluation will be completed by another provider, this initial triage assessment does not replace that evaluation, and the importance of remaining in the ED until their evaluation is complete.  Chest pain Labs and orders placed along with CT dissection study   Harold Pittman 01/25/22 1515

## 2022-01-25 NOTE — Telephone Encounter (Signed)
Okay. Thank you.

## 2022-01-26 ENCOUNTER — Emergency Department (HOSPITAL_COMMUNITY): Payer: Medicare Other

## 2022-01-26 ENCOUNTER — Emergency Department (HOSPITAL_BASED_OUTPATIENT_CLINIC_OR_DEPARTMENT_OTHER): Payer: Medicare Other

## 2022-01-26 ENCOUNTER — Ambulatory Visit: Payer: Medicare Other | Admitting: Family Medicine

## 2022-01-26 DIAGNOSIS — R609 Edema, unspecified: Secondary | ICD-10-CM

## 2022-01-26 DIAGNOSIS — J439 Emphysema, unspecified: Secondary | ICD-10-CM | POA: Diagnosis not present

## 2022-01-26 LAB — TROPONIN I (HIGH SENSITIVITY): Troponin I (High Sensitivity): 25 ng/L — ABNORMAL HIGH (ref <18)

## 2022-01-26 LAB — RESP PANEL BY RT-PCR (FLU A&B, COVID) ARPGX2
Influenza A by PCR: NEGATIVE
Influenza B by PCR: NEGATIVE
SARS Coronavirus 2 by RT PCR: NEGATIVE

## 2022-01-26 LAB — CBG MONITORING, ED: Glucose-Capillary: 158 mg/dL — ABNORMAL HIGH (ref 70–99)

## 2022-01-26 MED ORDER — CARVEDILOL 12.5 MG PO TABS
25.0000 mg | ORAL_TABLET | Freq: Two times a day (BID) | ORAL | Status: DC
  Administered 2022-01-26: 25 mg via ORAL
  Filled 2022-01-26 (×2): qty 2

## 2022-01-26 MED ORDER — OXYCODONE-ACETAMINOPHEN 5-325 MG PO TABS
1.0000 | ORAL_TABLET | Freq: Once | ORAL | Status: AC
  Administered 2022-01-26: 1 via ORAL
  Filled 2022-01-26: qty 1

## 2022-01-26 MED ORDER — CYCLOBENZAPRINE HCL 10 MG PO TABS: 5.0000 mg | ORAL_TABLET | Freq: Two times a day (BID) | ORAL | 0 refills | Status: DC | PRN

## 2022-01-26 MED ORDER — AMLODIPINE BESYLATE 5 MG PO TABS
5.0000 mg | ORAL_TABLET | Freq: Once | ORAL | Status: AC
  Administered 2022-01-26: 5 mg via ORAL
  Filled 2022-01-26: qty 1

## 2022-01-26 MED ORDER — FAMOTIDINE 20 MG PO TABS
40.0000 mg | ORAL_TABLET | Freq: Once | ORAL | Status: AC
  Administered 2022-01-26: 40 mg via ORAL
  Filled 2022-01-26: qty 2

## 2022-01-26 MED ORDER — TORSEMIDE 20 MG PO TABS
100.0000 mg | ORAL_TABLET | Freq: Every day | ORAL | Status: DC
  Administered 2022-01-26 – 2022-01-31 (×6): 100 mg via ORAL
  Filled 2022-01-26 (×5): qty 5

## 2022-01-26 NOTE — Progress Notes (Signed)
TOC CSW currently awaiting PT eval for recommendations.  Family is interested in SNF.  Lothar Prehn Tarpley-Carter, MSW, LCSW-A Pronouns:  She/Her/Hers [facility] Clinical Social Worker Direct Number:  [phone] [email or web address]

## 2022-01-26 NOTE — ED Provider Notes (Signed)
Patient seen by previous team earlier today and he was evaluated for chest discomfort.  As the work-up did not reveal an acute reason to require admission to the hospital, plan of care was discharged home.  While patient was in the process of being discharged, he was complaining of more generalized weakness and fatigue and did not feel he could safely stand or ambulate.  He lives at home alone without any help and he and family do not feel he was safe for discharge with his inability to safely stand.  I went to reassess the patient as she is still complaining about not being able to get up or sit up or move around.  As he lives alone he is concerned about his fall risk.  He said that he was supposed evaluated by physical therapy next week at some point to determine what his needs are however family does not feel he is safe for discharge.  Patient initially wanted to try p.o. challenge and if he had more energy would be able to go but after eating and drinking he still does not feel safe sitting up or ambulating.  Spoke to case management who will come see patient and will place a physical therapy consult to help determine disposition.  We will flip him to boarding and after pharmacy tech reviews his home medications, will order home meds.  Patient will need to see CSW/TOC and PT to discuss further management.  Was informed that pharmacy tech is not available right now, will order some of the home meds but will wait on some of the other ones for confirmation.     Sabatino Williard, Gwenyth Allegra, MD 01/27/22 [phone]

## 2022-01-26 NOTE — ED Notes (Signed)
Daughter called for pt to be picked up for d/c.

## 2022-01-26 NOTE — Progress Notes (Signed)
Bilateral lower extremity venous duplex completed. Refer to "CV Proc" under chart review to view preliminary results.  01/26/2022 4:12 PM Kelby Aline., MHA, RVT, RDCS, RDMS

## 2022-01-26 NOTE — ED Provider Notes (Signed)
I provided a substantive portion of the care of this patient.  I personally performed the entirety of the medical decision making for this encounter.  EKG Interpretation  Date/Time:  Thursday January 25 2022 14:45:41 EDT Ventricular Rate:  70 PR Interval:  208 QRS Duration: 92 QT Interval:  392 QTC Calculation: 423 R Axis:   28 Text Interpretation: Normal sinus rhythm Nonspecific T wave abnormality Abnormal ECG When compared with ECG of 15-May-2021 20:15, T wave abnormality in v3-v6 is more pronounced Confirmed by Pattricia Boss [phone]) on 01/26/2022 11:39:56 AM   81 year old male presents with 2 weeks of back and chest discomfort that is worse with movement.  Had work-up here for dissection as well as pneumonias, negative here.  Patient is EKG per interpretation shows normal sinus rhythm nonspecific T wave changes.  Patient's troponins here are flat.  No concern for ACS.  Very low suspicion for PE.  Patient also has leg discomfort and will do Dopplers.  We will check COVID test.  Likely discharge home   Lacretia Leigh, MD 01/26/22 1541

## 2022-01-26 NOTE — Telephone Encounter (Signed)
Caller states that patient is in the hospital since 230 yesterday. Requesting call back at [phone]  [facility] call id [phone]

## 2022-01-26 NOTE — ED Provider Notes (Signed)
[facility] EMERGENCY DEPARTMENT Provider Note   CSN: [id number] Arrival date & time: 01/25/22  1438     History {Add pertinent medical, surgical, social history, OB history to HPI:1} Chief Complaint  Patient presents with   Chest Pain    Harold Pittman is a 81 y.o. male.   Chest Pain   81 year old male presents emergency department with complaints of chest pain.  Patient describes pain as originating in his left shoulder approximately 1 week ago after he got his COVID vaccination.  He reports pain radiating to the left side of chest approximately 2 days thereafter with radiation to his left back.  He states pain has been persistent in nature since onset and has had associated shortness of breath.  Describes pain as pressure/tightness.  States the pain is sometimes worsened with taking a deep breath.  States he took at home hydrocodone which did not relieve his symptoms.  Denies fever, chills, night sweats, cough, congestion, history of DVT/PE, abdominal pain, nausea, vomiting, urinary symptoms, change in bowel habits.    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 Authorizing Provider  cyclobenzaprine (FLEXERIL) 10 MG tablet Take 0.5 tablets (5 mg total) by mouth 2 (two) times daily as needed for muscle spasms. 01/26/22  Yes Dion Saucier A, PA  amLODipine (NORVASC) 10 MG tablet Take 0.5 tablets (5 mg total) by mouth daily. 10/16/21   Plotnikov, Evie Lacks, MD  aspirin 81 MG EC tablet Take 1 tablet (81 mg total) by mouth daily. 04/22/19   Plotnikov, Evie Lacks, MD  B Complex-Folic Acid (B COMPLEX PLUS) TABS Take 1 tablet by mouth daily. 01/15/22   Plotnikov, Evie Lacks, MD  Blood Glucose Monitoring Suppl (ONETOUCH VERIO FLEX SYSTEM) w/Device KIT USE TO MONITOR BLOOD SUGAR  06/28/20   Plotnikov, Evie Lacks, MD  carvedilol (COREG) 25 MG tablet Take 1 tablet (25 mg total) by mouth 2 (two) times daily with a meal. 10/04/21   Plotnikov, Evie Lacks, MD  Cholecalciferol (VITAMIN D3) 50 MCG (2000 UT) capsule Take 1 capsule (2,000 Units total) by mouth daily. 04/23/19   Plotnikov, Evie Lacks, MD  colchicine 0.6 MG tablet TAKE 1 TABLET(0.6 MG) BY MOUTH DAILY 12/12/21   Plotnikov, Evie Lacks, MD  Cyanocobalamin (VITAMIN B-12) 1000 MCG SUBL Place 1 tablet (1,000 mcg total) under the tongue daily. 04/23/19   Plotnikov, Evie Lacks, MD  docusate sodium (COLACE) 100 MG capsule Take 1 capsule (100 mg total) by mouth daily as needed for mild constipation. 07/18/21   Plotnikov, Evie Lacks, MD  famotidine (PEPCID) 40 MG tablet Take 1 tablet (40 mg total) by mouth daily. 07/18/21   Plotnikov, Evie Lacks, MD  glucose blood (ONETOUCH VERIO) test strip CHECK BLOOD SUGAR 1 TO 2  TIMES DAILY AS DIRECTED 06/28/20   Plotnikov, Evie Lacks, MD  HYDROcodone-acetaminophen (NORCO) 5-325 MG tablet Take 0.5-1 tablets by mouth every 6 (six) hours as needed for severe pain. 01/15/22   Plotnikov, Evie Lacks, MD  Lancets (ONETOUCH DELICA PLUS [id number]) MISC USE AS DIRECTED 06/28/20   Plotnikov, Evie Lacks, MD  omeprazole (PRILOSEC) 40 MG capsule Take 1 capsule (40 mg total) by mouth daily. 09/25/21   Plotnikov, Evie Lacks, MD  ondansetron (ZOFRAN) 4 MG tablet Take 1 tablet (4 mg total) by mouth every 8 (eight) hours as needed  for nausea or vomiting. 09/02/19   Plotnikov, Evie Lacks, MD  potassium chloride (KLOR-CON) 10 MEQ tablet TAKE 1 TABLET(10 MEQ) BY MOUTH DAILY 12/09/20   Plotnikov, Evie Lacks, MD  pravastatin (PRAVACHOL) 80 MG tablet Take 1 tablet (80 mg total) by mouth at bedtime. 10/04/21   Plotnikov, Evie Lacks, MD  repaglinide (PRANDIN) 1 MG tablet 1 po before or with dinner 05/24/21   Plotnikov, Evie Lacks, MD  torsemide (DEMADEX) 100 MG tablet TAKE 1 TABLET BY MOUTH DAILY 05/18/21   Plotnikov, Evie Lacks, MD      Allergies     Benazepril and Gabapentin    Review of Systems   Review of Systems  Cardiovascular:  Positive for chest pain.  All other systems reviewed and are negative.   Physical Exam Updated Vital Signs BP (!) 147/70   Pulse 90   Temp 98.8 F (37.1 C)   Resp (!) 25   Ht 6' (1.829 m)   Wt 79.4 kg   SpO2 94%   BMI 23.73 kg/m  Physical Exam Vitals and nursing note reviewed.  Constitutional:      General: He is not in acute distress.    Appearance: He is well-developed.  HENT:     Head: Normocephalic and atraumatic.  Eyes:     Conjunctiva/sclera: Conjunctivae normal.  Cardiovascular:     Rate and Rhythm: Normal rate and regular rhythm.     Heart sounds:     No friction rub. No gallop.  Pulmonary:     Effort: Pulmonary effort is normal. No respiratory distress.     Breath sounds: Normal breath sounds. No stridor. No wheezing, rhonchi or rales.  Chest:     Chest wall: No tenderness.  Abdominal:     Palpations: Abdomen is soft.     Tenderness: There is no abdominal tenderness.  Musculoskeletal:        General: No swelling.     Cervical back: Neck supple.     Right lower leg: Edema present.     Comments: 1-2+ pitting edema noted right lower extremity.  Patient has previous surgical scars and states that swelling is per baseline.  Skin:    General: Skin is warm and dry.     Capillary Refill: Capillary refill takes less than 2 seconds.  Neurological:     Mental Status: He is alert.  Psychiatric:        Mood and Affect: Mood normal.     ED Results / Procedures / Treatments   Labs (all labs ordered are listed, but only abnormal results are displayed) Labs Reviewed  CBC - Abnormal; Notable for the following components:      Result Value   RBC 3.89 (*)    Hemoglobin 11.3 (*)    HCT 32.9 (*)    All other components within normal limits  COMPREHENSIVE METABOLIC PANEL - Abnormal; Notable for the following components:   Glucose, Bld 111 (*)    BUN 33 (*)    Creatinine, Ser  2.56 (*)    GFR, Estimated 25 (*)    Anion gap 16 (*)    All other components within normal limits  CBG MONITORING, ED - Abnormal; Notable for the following components:   Glucose-Capillary 158 (*)    All other components within normal limits  TROPONIN I (HIGH SENSITIVITY) - Abnormal; Notable for the following components:   Troponin I (High Sensitivity) 21 (*)    All other components within normal limits  TROPONIN I (HIGH SENSITIVITY) -  Abnormal; Notable for the following components:   Troponin I (High Sensitivity) 21 (*)    All other components within normal limits  TROPONIN I (HIGH SENSITIVITY) - Abnormal; Notable for the following components:   Troponin I (High Sensitivity) 25 (*)    All other components within normal limits  RESP PANEL BY RT-PCR (FLU A&B, COVID) [id number]  TROPONIN I (HIGH SENSITIVITY)    EKG EKG Interpretation  Date/Time:  Thursday January 25 2022 14:45:41 EDT Ventricular Rate:  70 PR Interval:  208 QRS Duration: 92 QT Interval:  392 QTC Calculation: 423 R Axis:   28 Text Interpretation: Normal sinus rhythm Nonspecific T wave abnormality Abnormal ECG When compared with ECG of 15-May-2021 20:15, T wave abnormality in v3-v6 is more pronounced Confirmed by Pattricia Boss [phone]) on 01/26/2022 11:54:33 AM  Radiology VAS [place] LOWER EXTREMITY VENOUS (DVT) (ONLY [facility] & [facility])  Result Date: 01/26/2022  Lower Venous DVT Study Patient Name:  JARQUIS WALKER  Date of Exam:   01/26/2022 Medical Rec #: [id number]        Accession #:    [id number] Date of Birth: 12-14-1940         Patient Gender: M Patient Age:   61 years Exam Location:  [facility] Procedure:      VAS [place] LOWER EXTREMITY VENOUS (DVT) Referring Phys: Rosa Gambale --------------------------------------------------------------------------------  Indications: Edema.  Comparison Study: No prior study Performing Technologist: Maudry Mayhew MHA, RDMS, RVT, RDCS  Examination Guidelines: A complete evaluation  includes B-mode imaging, spectral Doppler, color Doppler, and power Doppler as needed of all accessible portions of each vessel. Bilateral testing is considered an integral part of a complete examination. Limited examinations for reoccurring indications may be performed as noted. The reflux portion of the exam is performed with the patient in reverse Trendelenburg.  +---------+---------------+---------+-----------+----------+--------------+ RIGHT    CompressibilityPhasicitySpontaneityPropertiesThrombus Aging +---------+---------------+---------+-----------+----------+--------------+ CFV      Full           Yes      Yes                                 +---------+---------------+---------+-----------+----------+--------------+ SFJ      Full                                                        +---------+---------------+---------+-----------+----------+--------------+ FV Prox  Full                                                        +---------+---------------+---------+-----------+----------+--------------+ FV Mid   Full                                                        +---------+---------------+---------+-----------+----------+--------------+ FV DistalFull                                                        +---------+---------------+---------+-----------+----------+--------------+  PFV      Full                                                        +---------+---------------+---------+-----------+----------+--------------+ POP      Full           Yes      Yes                                 +---------+---------------+---------+-----------+----------+--------------+ PTV      Full                                                        +---------+---------------+---------+-----------+----------+--------------+ PERO     Full                                                         +---------+---------------+---------+-----------+----------+--------------+   +---------+---------------+---------+-----------+----------+--------------+ LEFT     CompressibilityPhasicitySpontaneityPropertiesThrombus Aging +---------+---------------+---------+-----------+----------+--------------+ CFV      Full           Yes      Yes                                 +---------+---------------+---------+-----------+----------+--------------+ SFJ      Full                                                        +---------+---------------+---------+-----------+----------+--------------+ FV Prox  Full                                                        +---------+---------------+---------+-----------+----------+--------------+ FV Mid   Full                                                        +---------+---------------+---------+-----------+----------+--------------+ FV DistalFull                                                        +---------+---------------+---------+-----------+----------+--------------+ PFV      Full                                                        +---------+---------------+---------+-----------+----------+--------------+  POP      Full           Yes      Yes                                 +---------+---------------+---------+-----------+----------+--------------+ PTV      Full                                                        +---------+---------------+---------+-----------+----------+--------------+ PERO     Full                                                        +---------+---------------+---------+-----------+----------+--------------+     Summary: RIGHT: - There is no evidence of deep vein thrombosis in the lower extremity.  - No cystic structure found in the popliteal fossa.  LEFT: - There is no evidence of deep vein thrombosis in the lower extremity.  - No cystic structure found in the popliteal fossa.   *See table(s) above for measurements and observations. Electronically signed by Servando Snare MD on 01/26/2022 at 5:28:02 PM.    Final    CT Chest Wo Contrast  Result Date: 01/26/2022 CLINICAL DATA:  81 year old male with history of chest pain radiating into the upper back. EXAM: CT CHEST WITHOUT CONTRAST TECHNIQUE: Multidetector CT imaging of the chest was performed following the standard protocol without IV contrast. RADIATION DOSE REDUCTION: This exam was performed according to the departmental dose-optimization program which includes automated exposure control, adjustment of the mA and/or kV according to patient size and/or use of iterative reconstruction technique. COMPARISON:  No priors. FINDINGS: Cardiovascular: Heart size is normal. There is no significant pericardial fluid, thickening or pericardial calcification. There is aortic atherosclerosis, as well as atherosclerosis of the great vessels of the mediastinum and the coronary arteries, including calcified atherosclerotic plaque in the left main, left anterior descending, left circumflex and right coronary arteries. Status post median sternotomy for CABG including LIMA to the LAD. Mediastinum/Nodes: No pathologically enlarged mediastinal or hilar lymph nodes. Please note that accurate exclusion of hilar adenopathy is limited on noncontrast CT scans. Esophagus is unremarkable in appearance. No axillary lymphadenopathy. Lungs/Pleura: No acute consolidative airspace disease. No pleural effusions. Cluster of micro nodularity in the basal aspect of the right lower lobe, strongly favored to be benign areas of mucoid impaction within terminal bronchioles. No other larger more suspicious appearing pulmonary nodules or masses are noted. Diffuse bronchial wall thickening with mild centrilobular and paraseptal emphysema. Upper Abdomen: Aortic atherosclerosis. Calcified gallstones lying dependently in the gallbladder. Multiple low-attenuation lesions in the  visualized liver, incompletely characterized on today's noncontrast CT examination, but statistically likely to represent cysts (no imaging follow-up is recommended). Incompletely imaged lesion in the upper pole of the right kidney which is complex in appearance with some faint internal calcifications. Additional 1.6 cm high attenuation lesion in the upper pole of the right kidney also noted. 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 Musculoskeletal: Median sternotomy wires. In the soft tissues of  the upper anterior left chest wall adjacent to the first costochondral junction and inferior to the medial left clavicle (axial image 23 of series 3 and coronal image 59 of series 5) there is a partially calcified high density lesion measuring 3.1 x 2.4 x 1.9 cm. There are no aggressive appearing lytic or blastic lesions noted in the visualized portions of the skeleton. IMPRESSION: 1. No acute findings in the thorax to account for the patient's symptoms. 2. Unusual partially calcified soft tissue lesion located between the undersurface of the medial left clavicle in the adjacent left first costochondral junction. This is of uncertain etiology and significance, but is favored to represent a benign area of heterotopic ossification. Given the patient's advanced age, repeat noncontrast chest CT is suggested in 3-6 months to ensure the stability of this finding. 3. Aortic atherosclerosis, in addition to left main and three-vessel coronary artery disease. Status post median sternotomy for CABG including LIMA to the LAD. 4. Diffuse bronchial wall thickening with mild centrilobular and paraseptal emphysema; imaging findings suggestive of underlying COPD. 5. Cholelithiasis. 6. Additional incidental findings, as above. Aortic Atherosclerosis (ICD10-I70.0) and Emphysema (ICD10-J43.9). Electronically Signed   By: Vinnie Langton  M.D.   On: 01/26/2022 09:30   DG Chest 2 View  Result Date: 01/25/2022 CLINICAL DATA:  Chest pain EXAM: CHEST - 2 VIEW COMPARISON:  05/15/2021 FINDINGS: Stable cardiomediastinal contours status post sternotomy and CABG. No focal airspace consolidation, pleural effusion, or pneumothorax. IMPRESSION: No active cardiopulmonary disease. Electronically Signed   By: Davina Poke D.O.   On: 01/25/2022 16:04    Procedures Procedures  {Document cardiac monitor, telemetry assessment procedure when appropriate:1}  Medications Ordered in ED Medications  carvedilol (COREG) tablet 25 mg (25 mg Oral Given 01/26/22 1648)  torsemide (DEMADEX) tablet 100 mg (100 mg Oral Given 01/26/22 1648)  amLODipine (NORVASC) tablet 5 mg (5 mg Oral Given 01/26/22 1648)  famotidine (PEPCID) tablet 40 mg (40 mg Oral Given 01/26/22 1648)  oxyCODONE-acetaminophen (PERCOCET/ROXICET) 5-325 MG per tablet 1 tablet (1 tablet Oral Given 01/26/22 1648)    ED Course/ Medical Decision Making/ A&P                           Medical Decision Making Risk Prescription drug management.   This patient presents to the ED for concern of chest pain, this involves an extensive number of treatment options, and is a complaint that carries with it a high risk of complications and morbidity.  The differential diagnosis includes aortic dissection, aortic aneurysm, pulmonary embolism, ACS, MSK, varicella-zoster, thoracic outlet syndrome, pericarditis, myocarditis, pericardial effusion, tamponade,    Co morbidities that complicate the patient evaluation  See HPI   Additional history obtained:  Additional history obtained from EMR External records from outside source obtained and reviewed including hospital records   Lab Tests:  I Ordered, and personally interpreted labs.  The pertinent results include: Initial troponin of 21 with delta 21-patient has baseline elevation troponin given patient's CKD; EKG concerning for sinus rhythm  with nonspecific T wave abnormalities.  No leukocytosis noted.  Mild evidence anemia with a hemoglobin 11.3; near patient's baseline.  Platelets within normal range.  No electrolyte abnormalities noted.  Renal function within normal limits per patient with a GFR 25, BUN of 33 and creatinine of 2.56.  No transaminitis noted.  Respiratory viral panel negative for COVID, influenza AMB.   Imaging Studies ordered:  I ordered imaging studies including chest x-ray, CT chest,  DVT study I independently visualized and interpreted imaging which showed  Chest x-ray: No active cardiopulmonary disease CT chest: No acute findings.  Calcified soft tissue lesion between undersurface of left medial clavicle.  Recommend follow-up CT in 3 to 6 months.  Aortic atherosclerosis.  Diffuse bronchial wall thickening with mild central lobar and paraseptal emphysema.  Cholelithiasis. DVT study: No acute DVT I agree with the radiologist interpretation  Cardiac Monitoring: / EKG:  The patient was maintained on a cardiac monitor.  I personally viewed and interpreted the cardiac monitored which showed an underlying rhythm of: Sinus rhythm with nonspecific T wave abnormalities.   Consultations Obtained:  Attending physician Dr. Antony Haste was consulted regarding the patient.  He evaluated the patient independently and was agreement with treatment plan going forward.  Problem List / ED Course / Critical interventions / Medication management  Chest pain I ordered medication including Norvasc, Coreg and Demadex for continuation of at home antihypertensive medications.  Percocet for pain.  Pepcid for pain.   Reevaluation of the patient after these medicines showed that the patient improved I have reviewed the patients home medicines and have made adjustments as needed   Social Determinants of Health:  Former cigarette use.  Denies illicit drug use.   Test / Admission - Considered:  Chest wall pain Vitals signs significant  for intermittent tachypnea was decreased with administration of pain. Otherwise within normal range and stable throughout visit. Laboratory/imaging studies significant for: See above Doubt ACS given delta negative troponin and nonspecific findings on EKG.  Doubt PE.  Doubt aortic dissection/aortic aneurysm.  Doubt pneumonia.  Doubt pneumothorax.  Patient to be discharged home with Tylenol as well as muscle laxer to use as needed.  Attending physician Dr. Zenia Resides evaluated the patient independently and was agreement with said plan.  Patient educated for repeat CT imaging regarding left subclavicular mass.  Treatment plan discussed at length with patient and he acknowledged understanding was agreeable to said plan. Worrisome signs and symptoms were discussed with the patient, and the patient acknowledged understanding to return to the ED if noticed. Patient was stable upon discharge.    {Document critical care time when appropriate:1} {Document review of labs and clinical decision tools ie heart score, Chads2Vasc2 etc:1}  {Document your independent review of radiology images, and any outside records:1} {Document your discussion with family members, caretakers, and with consultants:1} {Document social determinants of health affecting pt's care:1} {Document your decision making why or why not admission, treatments were needed:1} Final Clinical Impression(s) / ED Diagnoses Final diagnoses:  Chest pain, unspecified type    Rx / DC Orders ED Discharge Orders          Ordered    cyclobenzaprine (FLEXERIL) 10 MG tablet  2 times daily PRN        01/26/22 1817

## 2022-01-26 NOTE — Telephone Encounter (Signed)
Called pt spoke w/daughter she states dad been in the ER since yesterday he is suppose to be released some time today, but all his test came out good. MD suppose to give him a muscle relaxant, but need follow-up w/ you. Inform daughter MD is book ut will call on Monday once MD tell me whereto put him. She states that would be fine...[phone]/FXT

## 2022-01-26 NOTE — Discharge Instructions (Addendum)
your work-up today was overall reassuring.  Continue take Tylenol as needed for pain with your at home Box for breakthrough pain.  Use muscle laxer as needed.  Note the muscle laxer can cause drowsiness so please do not take while driving and to realize its effects on you.  Recommend follow-up with your PCP in 3 to 5 days for reevaluation of your symptoms.  Note that your CT of your chest showed a mass beneath your left clavicle and needs repeat CT imaging outpatient by your primary care provider in 3 to 6 months.  Please not hesitate to return to emergency department for worrisome signs symptoms we discussed become apparent.

## 2022-01-27 ENCOUNTER — Emergency Department (HOSPITAL_COMMUNITY): Payer: Medicare Other

## 2022-01-27 ENCOUNTER — Inpatient Hospital Stay (HOSPITAL_COMMUNITY): Payer: Medicare Other

## 2022-01-27 DIAGNOSIS — M25561 Pain in right knee: Secondary | ICD-10-CM | POA: Diagnosis not present

## 2022-01-27 DIAGNOSIS — R079 Chest pain, unspecified: Secondary | ICD-10-CM | POA: Diagnosis not present

## 2022-01-27 DIAGNOSIS — R1030 Lower abdominal pain, unspecified: Secondary | ICD-10-CM | POA: Diagnosis not present

## 2022-01-27 DIAGNOSIS — M25571 Pain in right ankle and joints of right foot: Secondary | ICD-10-CM | POA: Diagnosis not present

## 2022-01-27 DIAGNOSIS — M1000 Idiopathic gout, unspecified site: Secondary | ICD-10-CM

## 2022-01-27 DIAGNOSIS — M25562 Pain in left knee: Secondary | ICD-10-CM | POA: Diagnosis not present

## 2022-01-27 DIAGNOSIS — M25572 Pain in left ankle and joints of left foot: Secondary | ICD-10-CM | POA: Diagnosis not present

## 2022-01-27 DIAGNOSIS — R109 Unspecified abdominal pain: Secondary | ICD-10-CM | POA: Diagnosis not present

## 2022-01-27 DIAGNOSIS — I700 Atherosclerosis of aorta: Secondary | ICD-10-CM | POA: Diagnosis not present

## 2022-01-27 LAB — SARS CORONAVIRUS 2 BY RT PCR: SARS Coronavirus 2 by RT PCR: NEGATIVE

## 2022-01-27 LAB — COMPREHENSIVE METABOLIC PANEL
ALT: 13 U/L (ref 0–44)
AST: 21 U/L (ref 15–41)
Albumin: 3.4 g/dL — ABNORMAL LOW (ref 3.5–5.0)
Alkaline Phosphatase: 72 U/L (ref 38–126)
Anion gap: 20 — ABNORMAL HIGH (ref 5–15)
BUN: 36 mg/dL — ABNORMAL HIGH (ref 8–23)
CO2: 16 mmol/L — ABNORMAL LOW (ref 22–32)
Calcium: 8.5 mg/dL — ABNORMAL LOW (ref 8.9–10.3)
Chloride: 98 mmol/L (ref 98–111)
Creatinine, Ser: 2.43 mg/dL — ABNORMAL HIGH (ref 0.61–1.24)
GFR, Estimated: 26 mL/min — ABNORMAL LOW (ref >60)
Glucose, Bld: 169 mg/dL — ABNORMAL HIGH (ref 70–99)
Potassium: 3.2 mmol/L — ABNORMAL LOW (ref 3.5–5.1)
Sodium: 134 mmol/L — ABNORMAL LOW (ref 135–145)
Total Bilirubin: 1.3 mg/dL — ABNORMAL HIGH (ref 0.3–1.2)
Total Protein: 6.8 g/dL (ref 6.5–8.1)

## 2022-01-27 LAB — CBC
HCT: 30.9 % — ABNORMAL LOW (ref 39.0–52.0)
Hemoglobin: 10.5 g/dL — ABNORMAL LOW (ref 13.0–17.0)
MCH: 28.6 pg (ref 26.0–34.0)
MCHC: 34 g/dL (ref 30.0–36.0)
MCV: 84.2 fL (ref 80.0–100.0)
Platelets: 278 10*3/uL (ref 150–400)
RBC: 3.67 MIL/uL — ABNORMAL LOW (ref 4.22–5.81)
RDW: 13.3 % (ref 11.5–15.5)
WBC: 16.9 10*3/uL — ABNORMAL HIGH (ref 4.0–10.5)
nRBC: 0 % (ref 0.0–0.2)

## 2022-01-27 LAB — GLUCOSE, CAPILLARY: Glucose-Capillary: 210 mg/dL — ABNORMAL HIGH (ref 70–99)

## 2022-01-27 LAB — DIFFERENTIAL
Abs Immature Granulocytes: 0.47 10*3/uL — ABNORMAL HIGH (ref 0.00–0.07)
Basophils Absolute: 0 10*3/uL (ref 0.0–0.1)
Basophils Relative: 0 %
Eosinophils Absolute: 0 10*3/uL (ref 0.0–0.5)
Eosinophils Relative: 0 %
Immature Granulocytes: 3 %
Lymphocytes Relative: 3 %
Lymphs Abs: 0.6 10*3/uL — ABNORMAL LOW (ref 0.7–4.0)
Monocytes Absolute: 1.4 10*3/uL — ABNORMAL HIGH (ref 0.1–1.0)
Monocytes Relative: 8 %
Neutro Abs: 15 10*3/uL — ABNORMAL HIGH (ref 1.7–7.7)
Neutrophils Relative %: 86 %

## 2022-01-27 LAB — BLOOD GAS, VENOUS
Acid-Base Excess: 3.9 mmol/L — ABNORMAL HIGH (ref 0.0–2.0)
Bicarbonate: 28.5 mmol/L — ABNORMAL HIGH (ref 20.0–28.0)
O2 Saturation: 68.7 %
Patient temperature: 37
pCO2, Ven: 42 mmHg — ABNORMAL LOW (ref 44–60)
pH, Ven: 7.44 — ABNORMAL HIGH (ref 7.25–7.43)
pO2, Ven: 40 mmHg (ref 32–45)

## 2022-01-27 LAB — LACTIC ACID, PLASMA: Lactic Acid, Venous: 1.3 mmol/L (ref 0.5–1.9)

## 2022-01-27 LAB — URINALYSIS, MICROSCOPIC (REFLEX): Squamous Epithelial / HPF: NONE SEEN (ref 0–5)

## 2022-01-27 LAB — URINALYSIS, ROUTINE W REFLEX MICROSCOPIC
Bilirubin Urine: NEGATIVE
Glucose, UA: NEGATIVE mg/dL
Ketones, ur: NEGATIVE mg/dL
Leukocytes,Ua: NEGATIVE
Nitrite: NEGATIVE
Protein, ur: 100 mg/dL — AB
Specific Gravity, Urine: 1.02 (ref 1.005–1.030)
pH: 5.5 (ref 5.0–8.0)

## 2022-01-27 LAB — TROPONIN I (HIGH SENSITIVITY): Troponin I (High Sensitivity): 36 ng/L — ABNORMAL HIGH (ref <18)

## 2022-01-27 MED ORDER — SENNOSIDES-DOCUSATE SODIUM 8.6-50 MG PO TABS: 1.0000 | ORAL_TABLET | Freq: Every evening | ORAL | Status: DC | PRN

## 2022-01-27 MED ORDER — ASPIRIN 81 MG PO TBEC
81.0000 mg | DELAYED_RELEASE_TABLET | Freq: Every day | ORAL | Status: DC
  Administered 2022-01-27 – 2022-01-31 (×5): 81 mg via ORAL
  Filled 2022-01-27 (×5): qty 1

## 2022-01-27 MED ORDER — LACTATED RINGERS IV BOLUS
1000.0000 mL | Freq: Once | INTRAVENOUS | Status: AC
  Administered 2022-01-27: 1000 mL via INTRAVENOUS

## 2022-01-27 MED ORDER — POTASSIUM CHLORIDE 20 MEQ PO PACK
40.0000 meq | PACK | Freq: Once | ORAL | Status: AC
  Administered 2022-01-27: 40 meq via ORAL
  Filled 2022-01-27: qty 2

## 2022-01-27 MED ORDER — HYDROCODONE-ACETAMINOPHEN 5-325 MG PO TABS: 0.5000 | ORAL_TABLET | Freq: Four times a day (QID) | ORAL | Status: DC | PRN

## 2022-01-27 MED ORDER — HYDROCODONE-ACETAMINOPHEN 5-325 MG PO TABS: 1.0000 | ORAL_TABLET | ORAL | Status: DC | PRN

## 2022-01-27 MED ORDER — TORSEMIDE 20 MG PO TABS: 100.0000 mg | ORAL_TABLET | Freq: Every day | ORAL | Status: DC

## 2022-01-27 MED ORDER — SODIUM BICARBONATE 650 MG PO TABS
650.0000 mg | ORAL_TABLET | Freq: Two times a day (BID) | ORAL | Status: DC
  Administered 2022-01-27 – 2022-01-31 (×8): 650 mg via ORAL
  Filled 2022-01-27 (×8): qty 1

## 2022-01-27 MED ORDER — PREDNISONE 20 MG PO TABS
60.0000 mg | ORAL_TABLET | Freq: Once | ORAL | Status: AC
  Administered 2022-01-27: 60 mg via ORAL
  Filled 2022-01-27: qty 3

## 2022-01-27 MED ORDER — CARVEDILOL 25 MG PO TABS
25.0000 mg | ORAL_TABLET | Freq: Two times a day (BID) | ORAL | Status: DC
  Administered 2022-01-27 – 2022-01-31 (×8): 25 mg via ORAL
  Filled 2022-01-27: qty 1
  Filled 2022-01-27: qty 2
  Filled 2022-01-27 (×6): qty 1

## 2022-01-27 MED ORDER — BISACODYL 5 MG PO TBEC: 5.0000 mg | DELAYED_RELEASE_TABLET | Freq: Every day | ORAL | Status: DC | PRN

## 2022-01-27 MED ORDER — ACETAMINOPHEN 325 MG PO TABS
650.0000 mg | ORAL_TABLET | Freq: Once | ORAL | Status: AC | PRN
  Administered 2022-01-27: 650 mg via ORAL
  Filled 2022-01-27: qty 2

## 2022-01-27 MED ORDER — COLCHICINE 0.6 MG PO TABS
0.6000 mg | ORAL_TABLET | Freq: Every day | ORAL | Status: DC
  Administered 2022-01-28 – 2022-01-31 (×4): 0.6 mg via ORAL
  Filled 2022-01-27 (×4): qty 1

## 2022-01-27 MED ORDER — AMLODIPINE BESYLATE 5 MG PO TABS
5.0000 mg | ORAL_TABLET | Freq: Every day | ORAL | Status: DC
  Administered 2022-01-27 – 2022-01-31 (×5): 5 mg via ORAL
  Filled 2022-01-27 (×5): qty 1

## 2022-01-27 MED ORDER — FAMOTIDINE 20 MG PO TABS
40.0000 mg | ORAL_TABLET | Freq: Every day | ORAL | Status: DC
  Administered 2022-01-27 – 2022-01-31 (×5): 40 mg via ORAL
  Filled 2022-01-27 (×5): qty 2

## 2022-01-27 MED ORDER — PRAVASTATIN SODIUM 40 MG PO TABS
80.0000 mg | ORAL_TABLET | Freq: Every day | ORAL | Status: DC
  Administered 2022-01-27 – 2022-01-30 (×5): 80 mg via ORAL
  Filled 2022-01-27 (×5): qty 2

## 2022-01-27 MED ORDER — DOCUSATE SODIUM 100 MG PO CAPS: 100.0000 mg | ORAL_CAPSULE | Freq: Every day | ORAL | Status: DC | PRN

## 2022-01-27 MED ORDER — HYDROMORPHONE HCL 1 MG/ML IJ SOLN: 0.5000 mg | INTRAMUSCULAR | Status: DC | PRN

## 2022-01-27 MED ORDER — TRAMADOL HCL 50 MG PO TABS
50.0000 mg | ORAL_TABLET | Freq: Once | ORAL | Status: AC
  Administered 2022-01-27: 50 mg via ORAL
  Filled 2022-01-27: qty 1

## 2022-01-27 MED ORDER — PREDNISONE 20 MG PO TABS
40.0000 mg | ORAL_TABLET | Freq: Two times a day (BID) | ORAL | Status: DC
  Administered 2022-01-27 – 2022-01-29 (×4): 40 mg via ORAL
  Filled 2022-01-27 (×3): qty 2

## 2022-01-27 MED ORDER — ACETAMINOPHEN 650 MG RE SUPP: 650.0000 mg | Freq: Four times a day (QID) | RECTAL | Status: DC | PRN

## 2022-01-27 MED ORDER — REPAGLINIDE 1 MG PO TABS
0.5000 mg | ORAL_TABLET | Freq: Two times a day (BID) | ORAL | Status: DC
  Filled 2022-01-27 (×2): qty 0.5

## 2022-01-27 MED ORDER — INSULIN ASPART 100 UNIT/ML IJ SOLN
0.0000 [IU] | Freq: Three times a day (TID) | INTRAMUSCULAR | Status: DC
  Administered 2022-01-28: 3 [IU] via SUBCUTANEOUS

## 2022-01-27 MED ORDER — ACETAMINOPHEN 325 MG PO TABS: 650.0000 mg | ORAL_TABLET | Freq: Four times a day (QID) | ORAL | Status: DC | PRN

## 2022-01-27 MED ORDER — ONDANSETRON HCL 4 MG PO TABS: 4.0000 mg | ORAL_TABLET | Freq: Three times a day (TID) | ORAL | Status: DC | PRN

## 2022-01-27 MED ORDER — HEPARIN SODIUM (PORCINE) 5000 UNIT/ML IJ SOLN
5000.0000 [IU] | Freq: Two times a day (BID) | INTRAMUSCULAR | Status: DC
  Administered 2022-01-27 – 2022-01-31 (×8): 5000 [IU] via SUBCUTANEOUS
  Filled 2022-01-27 (×8): qty 1

## 2022-01-27 MED ORDER — PANTOPRAZOLE SODIUM 40 MG PO TBEC
40.0000 mg | DELAYED_RELEASE_TABLET | Freq: Every day | ORAL | Status: DC
  Administered 2022-01-27 – 2022-01-31 (×5): 40 mg via ORAL
  Filled 2022-01-27 (×5): qty 1

## 2022-01-27 MED ORDER — LACTATED RINGERS IV BOLUS
500.0000 mL | Freq: Once | INTRAVENOUS | Status: AC
  Administered 2022-01-27: 500 mL via INTRAVENOUS

## 2022-01-27 NOTE — ED Notes (Signed)
Patient resting watching tv, and eat food tray. No s/s of any distress. Denies pain or discomfort. Will continue to monitor

## 2022-01-27 NOTE — ED Notes (Signed)
Left HIPAA compliant voicemail to patient's son for patient update.

## 2022-01-27 NOTE — ED Notes (Signed)
PT in room working with pt.

## 2022-01-27 NOTE — ED Notes (Signed)
Pt complaining of increased right knee pain since attempting to stand and work with PT

## 2022-01-27 NOTE — ED Provider Notes (Addendum)
Emergency Medicine Observation Re-evaluation Note  Harold Pittman is a 81 y.o. male, seen on rounds today.  Pt initially presented to the ED for complaints of Chest Pain Pts chest pain workup was largely unremarkable, and he has been boarding in ED for ~ 40 hours, due to general weakness, with plan for PT eval and possible SNF placement.  This AM pt noted w fever, 103. Pt denies new c/o or specific c/o related to possible source of fever. No headache. No neck pain/stiffness. No sore throat, sinus congestion/pain, cough or other uri symptoms. No current chest pain or sob. No abd pain or nvd. No dysuria or gu c/o. No focal joint, spine or extremity pain/swelling. No rash.   Physical Exam  BP (!) 151/73   Pulse 87   Temp (!) 103.1 F (39.5 C) (Oral)   Resp (!) 23   Ht 1.829 m (6')   Wt 79.4 kg   SpO2 95%   BMI 23.73 kg/m  Physical Exam General: febrile, alert, no distress. Neck: supple, no stiffness or rigidity. No l/a. HEENT: no sinus pain or tenderness. Pharynx normal.  Cardiac: regular rate. No g/m/r Lungs: cta bil.  Abd: soft, non tender.  GU: normal external gu exam. No cva tenderness.  MSK: no pain, swelling, or erythema on bil extremity exam. Spine is non tender, aligned no step off.   ED Course / MDM    I have reviewed the labs performed to date as well as medications administered while in observation.  Recent changes in the last 24 hours include ED obs, reassessment.   Plan  Will repeat labs/chem, ua. Pt did have flu/covid test yesterday that was negative. Pt with chest ct yesterday neg for pna.  Pts chest ct did show gallstones. Pt denies any abd pain, no abd tenderness.  Iv bolus. Po fluids.       Lajean Saver, MD 01/27/22 [phone]    Repeat covid test neg. Ct abd neg for acute process. No abd tenderness on repeat exam. Pt continues to deny new symptoms re: possible source of fever. No chills or sweats.   Repeat/recheck vitals normal.  ?possible viral process as no  source of infxn. Pt denies faintness or new/worsening weakness. Denies pain. No chills or sweats. Cxs pending.       Lajean Saver, MD 01/27/22 1334  Pt notes pain right knee, right ankle. Notes feels similar/same as prior gout. No erythema or increased warmth to areas, no pain w passive rom.  Prednisone po. Ultram po.    Lajean Saver, MD 01/27/22 1411    Signed out to [employer], Dr Kathrynn Humble to pending labs, recheck patient.  Expectant management.  TOC efforts pending.       Lajean Saver, MD 01/27/22 1511

## 2022-01-27 NOTE — ED Provider Notes (Signed)
Patient reassessed by me at the request of family.  Family at the bedside now.  They are questioning if patient needs admission to the hospital given that he is having fever.  Earlier today patient had a temp of 103.1.  The physician that morning assessed the patient.  On my assessment, just like the morning doc, there is no clear source of infection.  Specifically patient denies any chest pain, cough, URI-like symptoms, burning with urination, blood in the urine.  He has chronic knee swelling over the right side.  He is also complaining of lower quadrant abdominal pain on exam, but had a CT scan abdomen pelvis without contrast which was negative.  He had a CT chest without contrast yesterday that was fine.  Discussed with the family that the lab work-up here showed elevated white count of 16, but the infection work-up has not resulted anything.  Patient was given Tylenol, his fever has come down.  I discussed the CT findings as well.  I reviewed patient's lab independently.  His bicarb is gone down to 16.  He has anion gap of 20.  Urine culture, blood culture are pending.  I do not think this patient is safe for admission to a facility straight from emergency room with evolving history while being assessed by multiple emergency providers.  I have consulted medicine team to assess the patient to see if he needs admission and give [place] some recommendations.   Varney Biles, MD 01/27/22 1701

## 2022-01-27 NOTE — ED Notes (Signed)
ED TO INPATIENT HANDOFF REPORT  ED Nurse Name and Phone #: Philip Aspen Name/Age/Gender Harold Pittman 81 y.o. male Room/Bed: 009C/009C  Code Status   Code Status: Full Code  Home/SNF/Other Home Patient oriented to: self, place, time, and situation Is this baseline? Yes   Triage Complete: Triage complete  Chief Complaint Gout attack [M10.9]  Triage Note Pt endorses CP since getting his covid and flu shot within the past few weeks.    Allergies Allergies  Allergen Reactions   Benazepril Shortness Of Breath    Cough, wheezing   Gabapentin     myoclonus    Level of Care/Admitting Diagnosis ED Disposition     ED Disposition  Admit   Condition  --   Comment  Hospital Area: [facility] [[id number]]  Level of Care: Telemetry Medical [104]  May admit patient to [facility] or [facility] if equivalent level of care is available:: No  Covid Evaluation: Asymptomatic - no recent exposure (last 10 days) testing not required  Diagnosis: Gout attack [372947]  Admitting Physician: Lequita Halt [[id number]]  Attending Physician: Lequita Halt [[id number]]  Certification:: I certify this patient will need inpatient services for at least 2 midnights  Estimated Length of Stay: 2          B Medical/Surgery History Past Medical History:  Diagnosis Date   Anemia    low iron   Anxiety state, unspecified    Blood in stool    Chronic airway obstruction, not elsewhere classified    Coronary artery disease    Coronary atherosclerosis of artery bypass graft    Dyspnea    ED (erectile dysfunction)    Esophageal reflux    Gout, unspecified    Osteoarthrosis, unspecified whether generalized or localized, unspecified site    Other and unspecified hyperlipidemia    Other specified disorder of rectum and anus    Personal history of tobacco use, presenting hazards to health    Sleep apnea    does not use cpap   Type II or unspecified type diabetes mellitus without  mention of complication, not stated as uncontrolled    Unspecified essential hypertension    Past Surgical History:  Procedure Laterality Date   BIOPSY  09/26/2017   Procedure: BIOPSY;  Surgeon: Ladene Artist, MD;  Location: [place];  Service: Endoscopy;;   BIOPSY  02/03/2018   Procedure: BIOPSY;  Surgeon: Irving Copas., MD;  Location: [facility] ENDOSCOPY;  Service: Gastroenterology;;   COLONOSCOPY WITH PROPOFOL N/A 09/26/2017   Procedure: COLONOSCOPY WITH PROPOFOL;  Surgeon: Ladene Artist, MD;  Location: [facility] ENDOSCOPY;  Service: Endoscopy;  Laterality: N/A;   CORONARY ARTERY BYPASS GRAFT  72   LIMA to LAD, sequential saphenous vein graft to the first and second diagonal, sequential saphenous vein graft to the intermediate OM and circumflex and SVG to RCA   ESOPHAGOGASTRODUODENOSCOPY (EGD) WITH PROPOFOL N/A 09/26/2017   Procedure: ESOPHAGOGASTRODUODENOSCOPY (EGD) WITH PROPOFOL;  Surgeon: Ladene Artist, MD;  Location: [facility] ENDOSCOPY;  Service: Endoscopy;  Laterality: N/A;   ESOPHAGOGASTRODUODENOSCOPY (EGD) WITH PROPOFOL N/A 02/03/2018   Procedure: ESOPHAGOGASTRODUODENOSCOPY (EGD);  Surgeon: Irving Copas., MD;  Location: [place];  Service: Gastroenterology;  Laterality: N/A;   KNEE SURGERY     BILATERAL   POLYPECTOMY  09/26/2017   Procedure: POLYPECTOMY;  Surgeon: Ladene Artist, MD;  Location: [facility] ENDOSCOPY;  Service: Endoscopy;;   [place] INJECTION  02/03/2018  Procedure: SUBMUCOSAL INJECTION;  Surgeon: Irving Copas., MD;  Location: [facility] ENDOSCOPY;  Service: Gastroenterology;;     A IV Location/Drains/Wounds Patient Lines/Drains/Airways Status     Active Line/Drains/Airways     Name Placement date Placement time Site Days   Peripheral IV 01/25/22 20 G Anterior;Proximal;Right Forearm 01/25/22  1516  Forearm  2   External Urinary Catheter 01/27/22  0000  --  less than 1            Intake/Output Last 24  hours  Intake/Output Summary (Last 24 hours) at 01/27/2022 2001 Last data filed at 01/27/2022 1943 Gross per 24 hour  Intake 1500 ml  Output --  Net 1500 ml    Labs/Imaging Results for orders placed or performed during the hospital encounter of 01/25/22 (from the past 48 hour(s))  CBG monitoring, ED     Status: Abnormal   Collection Time: 01/26/22  1:24 AM  Result Value Ref Range   Glucose-Capillary 158 (H) 70 - 99 mg/dL    Comment: Glucose reference range applies only to samples taken after fasting for at least 8 hours.  Troponin I (High Sensitivity)     Status: Abnormal   Collection Time: 01/26/22 10:37 AM  Result Value Ref Range   Troponin I (High Sensitivity) 25 (H) <18 ng/L    Comment: (NOTE) Elevated high sensitivity troponin I (hsTnI) values and significant  changes across serial measurements may suggest ACS but many other  chronic and acute conditions are known to elevate hsTnI results.  Refer to the "Links" section for chest pain algorithms and additional  guidance. Performed at [facility], [place] [address] [place], [place] [address]   Resp Panel by RT-PCR (Flu A&B, Covid) Anterior Nasal Swab     Status: None   Collection Time: 01/26/22  3:42 PM   Specimen: Anterior Nasal Swab  Result Value Ref Range   SARS Coronavirus 2 by RT PCR NEGATIVE NEGATIVE    Comment: (NOTE) SARS-CoV-2 target nucleic acids are NOT DETECTED.  The SARS-CoV-2 RNA is generally detectable in upper respiratory specimens during the acute phase of infection. The lowest concentration of SARS-CoV-2 viral copies this assay can detect is 138 copies/mL. A negative result does not preclude SARS-Cov-2 infection and should not be used as the sole basis for treatment or other patient management decisions. A negative result may occur with  improper specimen collection/handling, submission of specimen other than nasopharyngeal swab, presence of viral mutation(s) within the areas targeted by this  assay, and inadequate number of viral copies(<138 copies/mL). A negative result must be combined with clinical observations, patient history, and epidemiological information. The expected result is Negative.  Fact Sheet for Patients:  EntrepreneurPulse.com.au  Fact Sheet for Healthcare Providers:  IncredibleEmployment.be  This test is no t yet approved or cleared by the [place] FDA and  has been authorized for detection and/or diagnosis of SARS-CoV-2 by FDA under an Emergency Use Authorization (EUA). This EUA will remain  in effect (meaning this test can be used) for the duration of the COVID-19 declaration under Section 564(b)(1) of the Act, 21 U.S.C.section 360bbb-3(b)(1), unless the authorization is terminated  or revoked sooner.       Influenza A by PCR NEGATIVE NEGATIVE   Influenza B by PCR NEGATIVE NEGATIVE    Comment: (NOTE) The Xpert Xpress SARS-CoV-2/FLU/RSV plus assay is intended as an aid in the diagnosis of influenza from Nasopharyngeal swab specimens and should not be used as a sole basis for treatment. Nasal  washings and aspirates are unacceptable for Xpert Xpress SARS-CoV-2/FLU/RSV testing.  Fact Sheet for Patients: EntrepreneurPulse.com.au  Fact Sheet for Healthcare Providers: IncredibleEmployment.be  This test is not yet approved or cleared by the [place] FDA and has been authorized for detection and/or diagnosis of SARS-CoV-2 by FDA under an Emergency Use Authorization (EUA). This EUA will remain in effect (meaning this test can be used) for the duration of the COVID-19 declaration under Section 564(b)(1) of the Act, 21 U.S.C. section 360bbb-3(b)(1), unless the authorization is terminated or revoked.  Performed at [facility], [place] [address] [place], [place] [address]   Troponin I (High Sensitivity)     Status: Abnormal   Collection Time: 01/27/22 12:46 AM   Result Value Ref Range   Troponin I (High Sensitivity) 36 (H) <18 ng/L    Comment: (NOTE) Elevated high sensitivity troponin I (hsTnI) values and significant  changes across serial measurements may suggest ACS but many other  chronic and acute conditions are known to elevate hsTnI results.  Refer to the "Links" section for chest pain algorithms and additional  guidance. Performed at [facility], [place] [address], [place], [place] [address]   Urinalysis, Routine w reflex microscopic Urine, Clean Catch     Status: Abnormal   Collection Time: 01/27/22  8:11 AM  Result Value Ref Range   Color, Urine YELLOW YELLOW   APPearance CLEAR CLEAR   Specific Gravity, Urine 1.020 1.005 - 1.030   pH 5.5 5.0 - 8.0   Glucose, UA NEGATIVE NEGATIVE mg/dL   Hgb urine dipstick MODERATE (A) NEGATIVE   Bilirubin Urine NEGATIVE NEGATIVE   Ketones, ur NEGATIVE NEGATIVE mg/dL   Protein, ur 100 (A) NEGATIVE mg/dL   Nitrite NEGATIVE NEGATIVE   Leukocytes,Ua NEGATIVE NEGATIVE    Comment: Performed at [facility] [address] [place], [place] [address]  Urinalysis, Microscopic (reflex)     Status: Abnormal   Collection Time: 01/27/22  8:11 AM  Result Value Ref Range   RBC / HPF 0-5 0 - 5 RBC/hpf   WBC, UA 0-5 0 - 5 WBC/hpf   Bacteria, UA RARE (A) NONE SEEN   Squamous Epithelial / LPF NONE SEEN 0 - 5    Comment: Performed at [facility], [place] [address], [place], [place] [address]  CBC     Status: Abnormal   Collection Time: 01/27/22  8:59 AM  Result Value Ref Range   WBC 16.9 (H) 4.0 - 10.5 K/uL   RBC 3.67 (L) 4.22 - 5.81 MIL/uL   Hemoglobin 10.5 (L) 13.0 - 17.0 g/dL   HCT 30.9 (L) 39.0 - 52.0 %   MCV 84.2 80.0 - 100.0 fL   MCH 28.6 26.0 - 34.0 pg   MCHC 34.0 30.0 - 36.0 g/dL   RDW 13.3 11.5 - 15.5 %   Platelets 278 150 - 400 K/uL   nRBC 0.0 0.0 - 0.2 %    Comment: Performed at [facility], [place] [address] [place], [place] [address]  Comprehensive metabolic panel      Status: Abnormal   Collection Time: 01/27/22  8:59 AM  Result Value Ref Range   Sodium 134 (L) 135 - 145 mmol/L   Potassium 3.2 (L) 3.5 - 5.1 mmol/L   Chloride 98 98 - 111 mmol/L   CO2 16 (L) 22 - 32 mmol/L   Glucose, Bld 169 (H) 70 - 99 mg/dL    Comment: Glucose reference range applies only  to samples taken after fasting for at least 8 hours.   BUN 36 (H) 8 - 23 mg/dL   Creatinine, Ser 2.43 (H) 0.61 - 1.24 mg/dL   Calcium 8.5 (L) 8.9 - 10.3 mg/dL   Total Protein 6.8 6.5 - 8.1 g/dL   Albumin 3.4 (L) 3.5 - 5.0 g/dL   AST 21 15 - 41 U/L   ALT 13 0 - 44 U/L   Alkaline Phosphatase 72 38 - 126 U/L   Total Bilirubin 1.3 (H) 0.3 - 1.2 mg/dL   GFR, Estimated 26 (L) >60 mL/min    Comment: (NOTE) Calculated using the CKD-EPI Creatinine Equation (2021)    Anion gap 20 (H) 5 - 15    Comment: Performed at [facility], [place] [address] [place], [place] [address]  Lactic acid, plasma     Status: None   Collection Time: 01/27/22  8:59 AM  Result Value Ref Range   Lactic Acid, Venous 1.3 0.5 - 1.9 mmol/L    Comment: Performed at [facility] [address] [place], [place] [address]  SARS Coronavirus 2 by RT PCR (hospital order, performed in [facility] hospital lab) *cepheid single result test* Anterior Nasal Swab     Status: None   Collection Time: 01/27/22  9:43 AM   Specimen: Anterior Nasal Swab  Result Value Ref Range   SARS Coronavirus 2 by RT PCR NEGATIVE NEGATIVE    Comment: (NOTE) SARS-CoV-2 target nucleic acids are NOT DETECTED.  The SARS-CoV-2 RNA is generally detectable in upper and lower respiratory specimens during the acute phase of infection. The lowest concentration of SARS-CoV-2 viral copies this assay can detect is 250 copies / mL. A negative result does not preclude SARS-CoV-2 infection and should not be used as the sole basis for treatment or other patient management decisions.  A negative result may occur with improper specimen collection / handling,  submission of specimen other than nasopharyngeal swab, presence of viral mutation(s) within the areas targeted by this assay, and inadequate number of viral copies (<250 copies / mL). A negative result must be combined with clinical observations, patient history, and epidemiological information.  Fact Sheet for Patients:   https://www.patel.info/  Fact Sheet for Healthcare Providers: https://hall.com/  This test is not yet approved or  cleared by the [place] FDA and has been authorized for detection and/or diagnosis of SARS-CoV-2 by FDA under an Emergency Use Authorization (EUA).  This EUA will remain in effect (meaning this test can be used) for the duration of the COVID-19 declaration under Section 564(b)(1) of the Act, 21 U.S.C. section 360bbb-3(b)(1), unless the authorization is terminated or revoked sooner.  Performed at [facility], [place] [address], [place], [place] [address]    DG Ankle 2 Views Right  Result Date: 01/27/2022 CLINICAL DATA:  Right ankle pain. EXAM: RIGHT ANKLE - 2 VIEW COMPARISON:  None Available. FINDINGS: There is no evidence of fracture, dislocation, or joint effusion. No significant joint space narrowing is noted. However, there does appear to be erosion involving the distal fibula with overlying soft tissue swelling or edema which may represent gout. IMPRESSION: Possible erosion involving distal fibula with overlying soft tissue swelling or edema suggesting possible gout. No fracture or dislocation is noted. Electronically Signed   By: Marijo Conception M.D.   On: 01/27/2022 19:40   DG Ankle 2 Views Left  Result Date: 01/27/2022 CLINICAL DATA:  Left ankle pain. EXAM: LEFT ANKLE - 2 VIEW COMPARISON:  None Available. FINDINGS: There is no evidence of fracture, dislocation, or joint effusion. There is no evidence of arthropathy or other focal bone abnormality. Soft tissues are unremarkable. IMPRESSION:  Negative. Electronically Signed   By: Marijo Conception M.D.   On: 01/27/2022 19:38   DG Knee 1-2 Views Right  Result Date: 01/27/2022 CLINICAL DATA:  Right knee pain. EXAM: RIGHT KNEE - 1-2 VIEW COMPARISON:  None Available. FINDINGS: No evidence of fracture or dislocation. Small suprapatellar joint effusion is noted. Moderate narrowing of medial joint space is noted. Soft tissues are unremarkable. 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 Small suprapatellar joint effusion. No fracture or dislocation. Electronically Signed   By: Marijo Conception M.D.   On: 01/27/2022 19:37   DG Knee 1-2 Views Left  Result Date: 01/27/2022 CLINICAL DATA:  Left knee pain. EXAM: LEFT KNEE - 1-2 VIEW COMPARISON:  None Available. FINDINGS: No evidence of fracture, dislocation, or joint effusion. Mild narrowing of medial joint space is noted. Soft tissues are unremarkable. IMPRESSION: Mild degenerative joint disease is noted medially. No acute abnormality seen. Electronically Signed   By: Marijo Conception M.D.   On: 01/27/2022 19:36   CT Abdomen Pelvis Wo Contrast  Result Date: 01/27/2022 CLINICAL DATA:  81 year old male with acute abdominal and pelvic pain and fever. EXAM: CT ABDOMEN AND PELVIS WITHOUT CONTRAST TECHNIQUE: Multidetector CT imaging of the abdomen and pelvis was performed following the standard protocol without IV contrast. RADIATION DOSE REDUCTION: This exam was performed according to the departmental dose-optimization program which includes automated exposure control, adjustment of the mA and/or kV according to patient size and/or use of iterative reconstruction technique. COMPARISON:  10/30/2005 CT.  03/31/2021 MR FINDINGS: Please note that parenchymal and vascular abnormalities may be missed as intravenous contrast was not administered. Lower chest: No acute abnormality. Hepatobiliary: Cholelithiasis identified without CT evidence of acute cholecystitis. Hepatic cysts are again  identified without significant hepatic abnormalities. There is no evidence of intrahepatic or extrahepatic biliary dilatation. Pancreas: Unremarkable Spleen: Unremarkable Adrenals/Urinary Tract: Multiple bilateral renal cysts are again identified and no follow-up imaging recommended. There is no evidence of hydronephrosis or urinary calculi. The adrenal glands and bladder are unremarkable. Stomach/Bowel: Stomach is within normal limits. Appendix appears normal. No evidence of bowel wall thickening, distention, or inflammatory changes. Vascular/Lymphatic: Aortic atherosclerosis. No enlarged abdominal or pelvic lymph nodes. Reproductive: Prostate enlargement again noted. Other: No ascites, focal collection or pneumoperitoneum. Musculoskeletal: No acute or suspicious bony abnormalities are noted. L5-S1 degenerative disc disease/spondylosis again noted. IMPRESSION: 1. No evidence of acute abnormality. 2. Cholelithiasis without CT evidence of acute cholecystitis. 3. Prostate enlargement. 4.  Aortic Atherosclerosis (ICD10-I70.0). Electronically Signed   By: Margarette Canada M.D.   On: 01/27/2022 13:17   VAS [place] LOWER EXTREMITY VENOUS (DVT) (ONLY [facility] & [facility])  Result Date: 01/26/2022  Lower Venous DVT Study Patient Name:  Harold Pittman  Date of Exam:   01/26/2022 Medical Rec #: [id number]        Accession #:    [id number] Date of Birth: 1940-04-14         Patient Gender: M Patient Age:   6 years Exam Location:  [facility] Procedure:      VAS [place] LOWER EXTREMITY VENOUS (DVT) Referring Phys: COOPER ROBBINS --------------------------------------------------------------------------------  Indications: Edema.  Comparison Study: No prior study Performing Technologist: Maudry Mayhew MHA, RDMS, RVT, RDCS  Examination Guidelines: A complete evaluation includes B-mode imaging, spectral Doppler, color Doppler, and power Doppler  as needed of all accessible portions of each vessel. Bilateral testing is considered an integral  part of a complete examination. Limited examinations for reoccurring indications may be performed as noted. The reflux portion of the exam is performed with the patient in reverse Trendelenburg.  +---------+---------------+---------+-----------+----------+--------------+ RIGHT    CompressibilityPhasicitySpontaneityPropertiesThrombus Aging +---------+---------------+---------+-----------+----------+--------------+ CFV      Full           Yes      Yes                                 +---------+---------------+---------+-----------+----------+--------------+ SFJ      Full                                                        +---------+---------------+---------+-----------+----------+--------------+ FV Prox  Full                                                        +---------+---------------+---------+-----------+----------+--------------+ FV Mid   Full                                                        +---------+---------------+---------+-----------+----------+--------------+ FV DistalFull                                                        +---------+---------------+---------+-----------+----------+--------------+ PFV      Full                                                        +---------+---------------+---------+-----------+----------+--------------+ POP      Full           Yes      Yes                                 +---------+---------------+---------+-----------+----------+--------------+ PTV      Full                                                        +---------+---------------+---------+-----------+----------+--------------+ PERO     Full                                                        +---------+---------------+---------+-----------+----------+--------------+   +---------+---------------+---------+-----------+----------+--------------+  LEFT     CompressibilityPhasicitySpontaneityPropertiesThrombus Aging  +---------+---------------+---------+-----------+----------+--------------+ CFV      Full           Yes      Yes                                 +---------+---------------+---------+-----------+----------+--------------+ SFJ      Full                                                        +---------+---------------+---------+-----------+----------+--------------+ FV Prox  Full                                                        +---------+---------------+---------+-----------+----------+--------------+ FV Mid   Full                                                        +---------+---------------+---------+-----------+----------+--------------+ FV DistalFull                                                        +---------+---------------+---------+-----------+----------+--------------+ PFV      Full                                                        +---------+---------------+---------+-----------+----------+--------------+ POP      Full           Yes      Yes                                 +---------+---------------+---------+-----------+----------+--------------+ PTV      Full                                                        +---------+---------------+---------+-----------+----------+--------------+ PERO     Full                                                        +---------+---------------+---------+-----------+----------+--------------+     Summary: RIGHT: - There is no evidence of deep vein thrombosis in the lower extremity.  - No cystic structure found in the popliteal fossa.  LEFT: - There is no evidence of deep vein thrombosis in the lower extremity.  - No  cystic structure found in the popliteal fossa.  *See table(s) above for measurements and observations. Electronically signed by Servando Snare MD on 01/26/2022 at 5:28:02 PM.    Final    CT Chest Wo Contrast  Result Date: 01/26/2022 CLINICAL DATA:  81 year old male with  history of chest pain radiating into the upper back. EXAM: CT CHEST WITHOUT CONTRAST TECHNIQUE: Multidetector CT imaging of the chest was performed following the standard protocol without IV contrast. RADIATION DOSE REDUCTION: This exam was performed according to the departmental dose-optimization program which includes automated exposure control, adjustment of the mA and/or kV according to patient size and/or use of iterative reconstruction technique. COMPARISON:  No priors. FINDINGS: Cardiovascular: Heart size is normal. There is no significant pericardial fluid, thickening or pericardial calcification. There is aortic atherosclerosis, as well as atherosclerosis of the great vessels of the mediastinum and the coronary arteries, including calcified atherosclerotic plaque in the left main, left anterior descending, left circumflex and right coronary arteries. Status post median sternotomy for CABG including LIMA to the LAD. Mediastinum/Nodes: No pathologically enlarged mediastinal or hilar lymph nodes. Please note that accurate exclusion of hilar adenopathy is limited on noncontrast CT scans. Esophagus is unremarkable in appearance. No axillary lymphadenopathy. Lungs/Pleura: No acute consolidative airspace disease. No pleural effusions. Cluster of micro nodularity in the basal aspect of the right lower lobe, strongly favored to be benign areas of mucoid impaction within terminal bronchioles. No other larger more suspicious appearing pulmonary nodules or masses are noted. Diffuse bronchial wall thickening with mild centrilobular and paraseptal emphysema. Upper Abdomen: Aortic atherosclerosis. Calcified gallstones lying dependently in the gallbladder. Multiple low-attenuation lesions in the visualized liver, incompletely characterized on today's noncontrast CT examination, but statistically likely to represent cysts (no imaging follow-up is recommended). Incompletely imaged lesion in the upper pole of the right kidney  which is complex in appearance with some faint internal calcifications. Additional 1.6 cm high attenuation lesion in the upper pole of the right kidney also noted. 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 Musculoskeletal: Median sternotomy wires. In the soft tissues of the upper anterior left chest wall adjacent to the first costochondral junction and inferior to the medial left clavicle (axial image 23 of series 3 and coronal image 59 of series 5) there is a partially calcified high density lesion measuring 3.1 x 2.4 x 1.9 cm. There are no aggressive appearing lytic or blastic lesions noted in the visualized portions of the skeleton. IMPRESSION: 1. No acute findings in the thorax to account for the patient's symptoms. 2. Unusual partially calcified soft tissue lesion located between the undersurface of the medial left clavicle in the adjacent left first costochondral junction. This is of uncertain etiology and significance, but is favored to represent a benign area of heterotopic ossification. Given the patient's advanced age, repeat noncontrast chest CT is suggested in 3-6 months to ensure the stability of this finding. 3. Aortic atherosclerosis, in addition to left main and three-vessel coronary artery disease. Status post median sternotomy for CABG including LIMA to the LAD. 4. Diffuse bronchial wall thickening with mild centrilobular and paraseptal emphysema; imaging findings suggestive of underlying COPD. 5. Cholelithiasis. 6. Additional incidental findings, as above. Aortic Atherosclerosis (ICD10-I70.0) and Emphysema (ICD10-J43.9). Electronically Signed   By: Vinnie Langton M.D.   On: 01/26/2022 09:30    Pending Labs [employer] (From admission, onward)     Start     Ordered  01/28/22 4196  Basic metabolic panel  Tomorrow morning,   R        01/27/22 1900   01/28/22 0500  CBC  Tomorrow  morning,   R        01/27/22 1901   01/27/22 1900  Blood gas, venous  Once,   R        01/27/22 1859   01/27/22 1659  Differential  Once,   URGENT        01/27/22 1658   01/27/22 1537  Urine Culture  Once,   URGENT       Question:  Indication  Answer:  Altered mental status (if no other cause identified)   01/27/22 1536   01/27/22 0811  Blood culture (routine x 2)  BLOOD CULTURE X 2,   R (with STAT occurrences)      01/27/22 0811            Vitals/Pain Today's Vitals   01/27/22 1150 01/27/22 1800 01/27/22 1938 01/27/22 1941  BP: 122/88 138/73    Pulse: 76 83    Resp: 19 20    Temp:  100.3 F (37.9 C)  98.9 F (37.2 C)  TempSrc:  Oral    SpO2: 100% 97%    Weight:      Height:      PainSc:   0-No pain     Isolation Precautions No active isolations  Medications Medications  torsemide (DEMADEX) tablet 100 mg (100 mg Oral Given 01/27/22 0923)  amLODipine (NORVASC) tablet 5 mg (5 mg Oral Given 01/27/22 0923)  aspirin EC tablet 81 mg (81 mg Oral Given 01/27/22 0923)  carvedilol (COREG) tablet 25 mg (25 mg Oral Given 01/27/22 1818)  famotidine (PEPCID) tablet 40 mg (40 mg Oral Given 01/27/22 0923)  pravastatin (PRAVACHOL) tablet 80 mg (80 mg Oral Given 01/27/22 0045)  colchicine tablet 0.6 mg (has no administration in time range)  HYDROcodone-acetaminophen (NORCO/VICODIN) 5-325 MG per tablet 0.5-1 tablet (has no administration in time range)  repaglinide (PRANDIN) tablet 0.5 mg (has no administration in time range)  docusate sodium (COLACE) capsule 100 mg (has no administration in time range)  pantoprazole (PROTONIX) EC tablet 40 mg (40 mg Oral Given 01/27/22 1937)  ondansetron (ZOFRAN) tablet 4 mg (has no administration in time range)  heparin injection 5,000 Units (has no administration in time range)  acetaminophen (TYLENOL) tablet 650 mg (has no administration in time range)    Or  acetaminophen (TYLENOL) suppository 650 mg (has no administration in time range)   HYDROcodone-acetaminophen (NORCO/VICODIN) 5-325 MG per tablet 1-2 tablet (has no administration in time range)  HYDROmorphone (DILAUDID) injection 0.5-1 mg (has no administration in time range)  bisacodyl (DULCOLAX) EC tablet 5 mg (has no administration in time range)  senna-docusate (Senokot-S) tablet 1 tablet (has no administration in time range)  predniSONE (DELTASONE) tablet 40 mg (40 mg Oral Given 01/27/22 1937)  insulin aspart (novoLOG) injection 0-15 Units (has no administration in time range)  sodium bicarbonate tablet 650 mg (has no administration in time range)  amLODipine (NORVASC) tablet 5 mg (5 mg Oral Given 01/26/22 1648)  famotidine (PEPCID) tablet 40 mg (40 mg Oral Given 01/26/22 1648)  oxyCODONE-acetaminophen (PERCOCET/ROXICET) 5-325 MG per tablet 1 tablet (1 tablet Oral Given 01/26/22 1648)  acetaminophen (TYLENOL) tablet 650 mg (650 mg Oral Given 01/27/22 0825)  lactated ringers bolus 1,000 mL (0 mLs Intravenous Stopped 01/27/22 1541)  potassium chloride (KLOR-CON) packet 40 mEq (40 mEq Oral Given 01/27/22 1301)  predniSONE (  DELTASONE) tablet 60 mg (60 mg Oral Given 01/27/22 1449)  traMADol (ULTRAM) tablet 50 mg (50 mg Oral Given 01/27/22 1449)  lactated ringers bolus 500 mL (0 mLs Intravenous Stopped 01/27/22 1943)    Mobility walks with device Low fall risk   Focused Assessments Cardiac Assessment Handoff:    Lab Results  Component Value Date   CKTOTAL 138 05/15/2021   No results found for: "DDIMER" Does the Patient currently have chest pain? No    R Recommendations: See Admitting Provider Note  Report given to:   Additional Notes:

## 2022-01-27 NOTE — ED Notes (Signed)
Patient transported to CT 

## 2022-01-27 NOTE — ED Notes (Signed)
Tried to get patient to get up and walk around. Unable to even sit up in bed.

## 2022-01-27 NOTE — TOC Initial Note (Signed)
Transition of Care [facility]) - Initial/Assessment Note    Patient Details  Name: Harold Pittman MRN: [id number] Date of Birth: [birth year]  [facility] CM/SW Contact:    Archie Endo, LCSW Phone Number: 01/27/2022, 2:54 PM  Clinical Narrative:                 CSW spoke with patient's daughter [name] who states patient does live alone and understands he needs rehab to be able to return to his prior level of independence. [name] reports patient has never been to a facility before but prefers placement in [place].  CSW completed FL2 and sent it to local facilities to obtain bed offers.  Expected Discharge Plan: Skilled Nursing Facility Barriers to Discharge: Ship broker, SNF Pending bed offer   Patient Goals and CMS Choice        Expected Discharge Plan and Services Expected Discharge Plan: [place] arrangements for the past 2 months: Single Family Home                                      Prior Living Arrangements/Services Living arrangements for the past 2 months: Single Family Home Lives with:: Self Patient language and need for interpreter reviewed:: Yes Do you feel safe going back to the place where you live?: Yes      Need for Family Participation in Patient Care: Yes (Comment) Care giver support system in place?: Yes (comment)   Criminal Activity/Legal Involvement Pertinent to Current Situation/Hospitalization: No - Comment as needed  Activities of Daily Living      Permission Sought/Granted                  Emotional Assessment Appearance:: Appears stated age Attitude/Demeanor/Rapport: Engaged Affect (typically observed): Accepting Orientation: : Oriented to Self, Oriented to Place Alcohol / Substance Use: Not Applicable Psych Involvement: No (comment)  Admission diagnosis:  CHEST PAIN Patient Active Problem List   Diagnosis Date Noted   Peripheral neuropathy 01/15/2022   Ataxia  01/15/2022   Type 2 diabetes mellitus (A1c 6.0 on 05/03/2021) with steroid-induced hyperglycemia 05/16/2021   CKD (chronic kidney disease) stage 4, GFR 15-29 ml/min (HCC) 05/15/2021   Olecranon bursitis, right elbow 05/15/2021   Tremor 05/15/2021   Elbow pain, left 12/05/2020   Constipation 12/05/2020   Atherosclerosis of aorta ([place]) 12/05/2020   Chest pain, atypical 12/05/2020   CTS (carpal tunnel syndrome) 03/24/2020   Grief 03/24/2020   Cholelithiasis 09/16/2019   Nausea 09/02/2019   Weight loss 09/02/2019   Elevated troponin 01/12/2019   Hypertensive urgency 01/10/2019   Well adult exam 10/20/2018   Balanitis 10/20/2018   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/27/2017   Adenoma of stomach 12/25/2017   Abnormal findings on esophagogastroduodenoscopy (EGD) 12/25/2017   Sebaceous cyst 10/04/2017   Iron deficiency anemia    Benign neoplasm of sigmoid colon    Benign neoplasm of cecum    Benign neoplasm of transverse colon    Positive occult stool blood test 09/24/2017   (HFpEF) heart failure with preserved ejection fraction ([place]) 09/24/2017   Hypokalemia 08/17/2015   Generalized weakness 07/22/2015   Gait disorder 03/18/2014   Low back pain 01/12/2014   History of colon polyps 12/17/2013   DM (diabetes mellitus), type 2 with peripheral vascular complications ([place]) 63/78/5885   Diarrhea 10/08/2012   Edema 10/08/2011  DOE (dyspnea on exertion) 06/29/2011   LATERAL EPICONDYLITIS, LEFT 02/20/2010   TOBACCO USE, QUIT 02/23/2009   Cough 02/16/2009   Hearing loss 10/15/2008   ERECTILE DYSFUNCTION 04/30/2008   Coronary artery disease involving native coronary artery of native heart without angina pectoris 04/30/2008   COPD (chronic obstructive pulmonary disease) ([place]) 05/21/2007   Anxiety state 05/20/2007   Insomnia 03/04/2007   Hyperlipidemia LDL goal <70 02/01/2007   Gout 02/01/2007   Essential hypertension 02/01/2007   GERD 02/01/2007   Osteoarthritis  02/01/2007   PCP:  Cassandria Anger, MD Pharmacy:   [id number] [address] [place] [address] Phone: [phone] Fax: [phone]  [facility] [place], [address] [place] [address] Phone: [phone] Fax: [phone]     Social Determinants of Health (SDOH) Interventions    Readmission Risk Interventions    05/17/2021   11:14 AM  Readmission Risk Prevention Plan  Transportation Screening Complete  PCP or Specialist Appt within 3-5 Days Complete  HRI or [place] Complete  Social Work Consult for [place] Planning/Counseling Complete  Palliative Care Screening Not Applicable  Medication Review Press photographer) Complete

## 2022-01-27 NOTE — H&P (Addendum)
History and Physical    Harold Pittman PJA:[id number] DOB: December 22, 1940 DOA: 01/25/2022  PCP: Cassandria Anger, MD (Confirm with patient/family/NH records and if not entered, this has to be entered at [facility] point of entry) Patient coming from: Home  I have personally briefly reviewed patient's old medical records in [place]  Chief Complaint: Whole body aching  HPI: Harold Pittman is a 81 y.o. male with medical history significant of gout, CKD stage IV, HTN, chronic HFpEF, IIDM, CAD, presented with whole body aching and fever.  Patient underwent COVID shot last Friday.  Then, Saturday, patient woke up with injection site arm and shoulder pain, he took some Tylenol that day but starting Sunday, he developed more pain of back.  Then Wednesday, patient started to have bilateral knee pain and bilateral ankle and feet pain.  He also had a low-grade fever 100.3 that day.  He also reported that he had previously gouty attack/flareup usually on his knees and ankles.  And 2 months ago for financial reasons, patient decreased his daily colchicine from 0.6 mg daily to every other day.  Patient has had trouble standing on his feet or even getting up from the bed for last 2 days.  Yesterday morning, patient had trouble to hold coffee cups because of the leg weakness.  He denies any cough no urinary problems, no diarrhea. ED Course: Spiked fever 103.1, blood pressure elevated no hypoxia.  Patient was given several rounds of pain medications, no significant improvement of joint pain involving bilateral knees and bilateral ankles.  Chest x-ray, no acute infiltrates, UA showed no significant pyuria.  Review of Systems: As per HPI otherwise 14 point review of systems negative.    Past Medical History:  Diagnosis Date   Anemia    low iron   Anxiety state, unspecified    Blood in stool    Chronic airway obstruction, not elsewhere classified    Coronary artery disease    Coronary atherosclerosis of  artery bypass graft    Dyspnea    ED (erectile dysfunction)    Esophageal reflux    Gout, unspecified    Osteoarthrosis, unspecified whether generalized or localized, unspecified site    Other and unspecified hyperlipidemia    Other specified disorder of rectum and anus    Personal history of tobacco use, presenting hazards to health    Sleep apnea    does not use cpap   Type II or unspecified type diabetes mellitus without mention of complication, not stated as uncontrolled    Unspecified essential hypertension     Past Surgical History:  Procedure Laterality Date   BIOPSY  09/26/2017   Procedure: BIOPSY;  Surgeon: Ladene Artist, MD;  Location: [place];  Service: Endoscopy;;   BIOPSY  02/03/2018   Procedure: BIOPSY;  Surgeon: Irving Copas., MD;  Location: [facility] ENDOSCOPY;  Service: Gastroenterology;;   COLONOSCOPY WITH PROPOFOL N/A 09/26/2017   Procedure: COLONOSCOPY WITH PROPOFOL;  Surgeon: Ladene Artist, MD;  Location: [facility] ENDOSCOPY;  Service: Endoscopy;  Laterality: N/A;   CORONARY ARTERY BYPASS GRAFT  5   LIMA to LAD, sequential saphenous vein graft to the first and second diagonal, sequential saphenous vein graft to the intermediate OM and circumflex and SVG to RCA   ESOPHAGOGASTRODUODENOSCOPY (EGD) WITH PROPOFOL N/A 09/26/2017   Procedure: ESOPHAGOGASTRODUODENOSCOPY (EGD) WITH PROPOFOL;  Surgeon: Ladene Artist, MD;  Location: [facility] ENDOSCOPY;  Service: Endoscopy;  Laterality: N/A;   ESOPHAGOGASTRODUODENOSCOPY (EGD) WITH PROPOFOL N/A 02/03/2018  Procedure: ESOPHAGOGASTRODUODENOSCOPY (EGD);  Surgeon: Irving Copas., MD;  Location: [place];  Service: Gastroenterology;  Laterality: N/A;   KNEE SURGERY     BILATERAL   POLYPECTOMY  09/26/2017   Procedure: POLYPECTOMY;  Surgeon: Ladene Artist, MD;  Location: [place];  Service: Endoscopy;;   [place] INJECTION  02/03/2018   Procedure: SUBMUCOSAL INJECTION;  Surgeon:  Irving Copas., MD;  Location: [place];  Service: Gastroenterology;;     reports that he has quit smoking. He has never used smokeless tobacco. He reports that he does not drink alcohol and does not use drugs.  Allergies  Allergen Reactions   Benazepril Shortness Of Breath    Cough, wheezing   Gabapentin     myoclonus    Family History  Problem Relation Age of Onset   Heart disease Mother 39       CAD   Mental illness Father        Alzheimer's   Depression Sister    Heart disease Sister    Stroke Son    Hypertension Other    Coronary artery disease Other        Male 1st degree relative <50   Colon cancer Neg Hx    Stomach cancer Neg Hx    Esophageal cancer Neg Hx    Liver disease Neg Hx    Pancreatic cancer Neg Hx    Rectal cancer Neg Hx      Prior to Admission medications   Medication Sig Start Date End Date Taking? Authorizing Provider  cyclobenzaprine (FLEXERIL) 10 MG tablet Take 0.5 tablets (5 mg total) by mouth 2 (two) times daily as needed for muscle spasms. 01/26/22  Yes Dion Saucier A, PA  amLODipine (NORVASC) 10 MG tablet Take 0.5 tablets (5 mg total) by mouth daily. 10/16/21   Plotnikov, Evie Lacks, MD  aspirin 81 MG EC tablet Take 1 tablet (81 mg total) by mouth daily. 04/22/19   Plotnikov, Evie Lacks, MD  B Complex-Folic Acid (B COMPLEX PLUS) TABS Take 1 tablet by mouth daily. 01/15/22   Plotnikov, Evie Lacks, MD  Blood Glucose Monitoring Suppl (ONETOUCH VERIO FLEX SYSTEM) w/Device KIT USE TO MONITOR BLOOD SUGAR 06/28/20   Plotnikov, Evie Lacks, MD  carvedilol (COREG) 25 MG tablet Take 1 tablet (25 mg total) by mouth 2 (two) times daily with a meal. 10/04/21   Plotnikov, Evie Lacks, MD  Cholecalciferol (VITAMIN D3) 50 MCG (2000 UT) capsule Take 1 capsule (2,000 Units total) by mouth daily. 04/23/19   Plotnikov, Evie Lacks, MD  colchicine 0.6 MG tablet TAKE 1 TABLET(0.6 MG) BY MOUTH DAILY 12/12/21   Plotnikov, Evie Lacks, MD  Cyanocobalamin (VITAMIN B-12) 1000  MCG SUBL Place 1 tablet (1,000 mcg total) under the tongue daily. 04/23/19   Plotnikov, Evie Lacks, MD  docusate sodium (COLACE) 100 MG capsule Take 1 capsule (100 mg total) by mouth daily as needed for mild constipation. 07/18/21   Plotnikov, Evie Lacks, MD  famotidine (PEPCID) 40 MG tablet Take 1 tablet (40 mg total) by mouth daily. 07/18/21   Plotnikov, Evie Lacks, MD  glucose blood (ONETOUCH VERIO) test strip CHECK BLOOD SUGAR 1 TO 2  TIMES DAILY AS DIRECTED 06/28/20   Plotnikov, Evie Lacks, MD  HYDROcodone-acetaminophen (NORCO) 5-325 MG tablet Take 0.5-1 tablets by mouth every 6 (six) hours as needed for severe pain. 01/15/22   Plotnikov, Evie Lacks, MD  Lancets [facility] DELICA PLUS [id number]) MISC USE AS DIRECTED 06/28/20  Plotnikov, Evie Lacks, MD  omeprazole (PRILOSEC) 40 MG capsule Take 1 capsule (40 mg total) by mouth daily. 09/25/21   Plotnikov, Evie Lacks, MD  ondansetron (ZOFRAN) 4 MG tablet Take 1 tablet (4 mg total) by mouth every 8 (eight) hours as needed for nausea or vomiting. 09/02/19   Plotnikov, Evie Lacks, MD  potassium chloride (KLOR-CON) 10 MEQ tablet TAKE 1 TABLET(10 MEQ) BY MOUTH DAILY 12/09/20   Plotnikov, Evie Lacks, MD  pravastatin (PRAVACHOL) 80 MG tablet Take 1 tablet (80 mg total) by mouth at bedtime. 10/04/21   Plotnikov, Evie Lacks, MD  repaglinide (PRANDIN) 1 MG tablet 1 po before or with dinner 05/24/21   Plotnikov, Evie Lacks, MD  torsemide (DEMADEX) 100 MG tablet TAKE 1 TABLET BY MOUTH DAILY 05/18/21   Plotnikov, Evie Lacks, MD    Physical Exam: Vitals:   01/27/22 0930 01/27/22 1018 01/27/22 1150 01/27/22 1800  BP:   122/88 138/73  Pulse: 79  76 83  Resp: (!) _0 Temp:  (!) 100.6 F (38.1 C)  100.3 F (37.9 C)  TempSrc:  Oral  Oral  SpO2: 99%  100% 97%  Weight:      Height:        Constitutional: NAD, calm, comfortable Vitals:   01/27/22 0930 01/27/22 1018 01/27/22 1150 01/27/22 1800  BP:   122/88 138/73  Pulse: 79  76 83  Resp: (!) _1 Temp:  (!) 100.6  F (38.1 C)  100.3 F (37.9 C)  TempSrc:  Oral  Oral  SpO2: 99%  100% 97%  Weight:      Height:       Eyes: PERRL, lids and conjunctivae normal ENMT: Mucous membranes are moist. Posterior pharynx clear of any exudate or lesions.Normal dentition.  Neck: normal, supple, no masses, no thyromegaly Respiratory: clear to auscultation bilaterally, no wheezing, no crackles. Normal respiratory effort. No accessory muscle use.  Cardiovascular: Regular rate and rhythm, no murmurs / rubs / gallops. No extremity edema. 2+ pedal pulses. No carotid bruits.  Abdomen: no tenderness, no masses palpated. No hepatosplenomegaly. Bowel sounds positive.  Musculoskeletal: Significant tenderness of bilateral knees and bilateral ankles and small joint of bilateral feet.  Significant swelling of bilateral knees and ankles right> left, significant decrease of ROM passively of bilateral knees and ankles and small joint of feet. Skin: no rashes, lesions, ulcers. No induration Neurologic: CN 2-12 grossly intact. Sensation intact, DTR normal. Strength 5/5 in all 4.  Psychiatric: Normal judgment and insight. Alert and oriented x 3. Normal mood.     Labs on Admission: I have personally reviewed following labs and imaging studies  CBC: Recent Labs  Lab 01/25/22 1527 01/27/22 0859  WBC 9.0 16.9*  HGB 11.3* 10.5*  HCT 32.9* 30.9*  MCV 84.6 84.2  PLT 286 518   Basic Metabolic Panel: Recent Labs  Lab 01/25/22 1527 01/27/22 0859  NA 141 134*  K 3.7 3.2*  CL 100 98  CO2 25 16*  GLUCOSE 111* 169*  BUN 33* 36*  CREATININE 2.56* 2.43*  CALCIUM 10.0 8.5*   GFR: Estimated Creatinine Clearance: 26.6 mL/min (A) (by C-G formula based on SCr of 2.43 mg/dL (H)). Liver Function Tests: Recent Labs  Lab 01/25/22 1527 01/27/22 0859  AST 20 21  ALT 13 13  ALKPHOS 84 72  BILITOT 1.1 1.3*  PROT 7.4 6.8  ALBUMIN 4.1 3.4*   No results for input(s): "LIPASE", "AMYLASE" in the last 168 hours. No results  for  input(s): "AMMONIA" in the last 168 hours. Coagulation Profile: No results for input(s): "INR", "PROTIME" in the last 168 hours. Cardiac Enzymes: No results for input(s): "CKTOTAL", "CKMB", "CKMBINDEX", "TROPONINI" in the last 168 hours. BNP (last 3 results) No results for input(s): "PROBNP" in the last 8760 hours. HbA1C: No results for input(s): "HGBA1C" in the last 72 hours. CBG: Recent Labs  Lab 01/26/22 0124  GLUCAP 158*   Lipid Profile: No results for input(s): "CHOL", "HDL", "LDLCALC", "TRIG", "CHOLHDL", "LDLDIRECT" in the last 72 hours. Thyroid Function Tests: No results for input(s): "TSH", "T4TOTAL", "FREET4", "T3FREE", "THYROIDAB" in the last 72 hours. Anemia Panel: No results for input(s): "VITAMINB12", "FOLATE", "FERRITIN", "TIBC", "IRON", "RETICCTPCT" in the last 72 hours. Urine analysis:    Component Value Date/Time   COLORURINE YELLOW 01/27/2022 0811   APPEARANCEUR CLEAR 01/27/2022 0811   LABSPEC 1.020 01/27/2022 0811   PHURINE 5.5 01/27/2022 0811   GLUCOSEU NEGATIVE 01/27/2022 0811   GLUCOSEU 100 (A) 10/20/2018 1624   HGBUR MODERATE (A) 01/27/2022 0811   BILIRUBINUR NEGATIVE 01/27/2022 0811   KETONESUR NEGATIVE 01/27/2022 0811   PROTEINUR 100 (A) 01/27/2022 0811   UROBILINOGEN 0.2 10/20/2018 1624   NITRITE NEGATIVE 01/27/2022 0811   LEUKOCYTESUR NEGATIVE 01/27/2022 0811    Radiological Exams on Admission: CT Abdomen Pelvis Wo Contrast  Result Date: 01/27/2022 CLINICAL DATA:  81 year old male with acute abdominal and pelvic pain and fever. EXAM: CT ABDOMEN AND PELVIS WITHOUT CONTRAST TECHNIQUE: Multidetector CT imaging of the abdomen and pelvis was performed following the standard protocol without IV contrast. RADIATION DOSE REDUCTION: This exam was performed according to the departmental dose-optimization program which includes automated exposure control, adjustment of the mA and/or kV according to patient size and/or use of iterative reconstruction  technique. COMPARISON:  10/30/2005 CT.  03/31/2021 MR FINDINGS: Please note that parenchymal and vascular abnormalities may be missed as intravenous contrast was not administered. Lower chest: No acute abnormality. Hepatobiliary: Cholelithiasis identified without CT evidence of acute cholecystitis. Hepatic cysts are again identified without significant hepatic abnormalities. There is no evidence of intrahepatic or extrahepatic biliary dilatation. Pancreas: Unremarkable Spleen: Unremarkable Adrenals/Urinary Tract: Multiple bilateral renal cysts are again identified and no follow-up imaging recommended. There is no evidence of hydronephrosis or urinary calculi. The adrenal glands and bladder are unremarkable. Stomach/Bowel: Stomach is within normal limits. Appendix appears normal. No evidence of bowel wall thickening, distention, or inflammatory changes. Vascular/Lymphatic: Aortic atherosclerosis. No enlarged abdominal or pelvic lymph nodes. Reproductive: Prostate enlargement again noted. Other: No ascites, focal collection or pneumoperitoneum. Musculoskeletal: No acute or suspicious bony abnormalities are noted. L5-S1 degenerative disc disease/spondylosis again noted. IMPRESSION: 1. No evidence of acute abnormality. 2. Cholelithiasis without CT evidence of acute cholecystitis. 3. Prostate enlargement. 4.  Aortic Atherosclerosis (ICD10-I70.0). Electronically Signed   By: Margarette Canada M.D.   On: 01/27/2022 13:17   VAS [place] LOWER EXTREMITY VENOUS (DVT) (ONLY [facility] & [facility])  Result Date: 01/26/2022  Lower Venous DVT Study Patient Name:  Harold Pittman  Date of Exam:   01/26/2022 Medical Rec #: [id number]        Accession #:    [id number] Date of Birth: 09/11/1940         Patient Gender: M Patient Age:   31 years Exam Location:  [facility] Procedure:      VAS [place] LOWER EXTREMITY VENOUS (DVT) Referring Phys: COOPER ROBBINS --------------------------------------------------------------------------------  Indications:  Edema.  Comparison Study: No prior study Performing Technologist: Maudry Mayhew MHA, RDMS, RVT, RDCS  Examination Guidelines: A complete evaluation includes B-mode imaging, spectral Doppler, color Doppler, and power Doppler as needed of all accessible portions of each vessel. Bilateral testing is considered an integral part of a complete examination. Limited examinations for reoccurring indications may be performed as noted. The reflux portion of the exam is performed with the patient in reverse Trendelenburg.  +---------+---------------+---------+-----------+----------+--------------+ RIGHT    CompressibilityPhasicitySpontaneityPropertiesThrombus Aging +---------+---------------+---------+-----------+----------+--------------+ CFV      Full           Yes      Yes                                 +---------+---------------+---------+-----------+----------+--------------+ SFJ      Full                                                        +---------+---------------+---------+-----------+----------+--------------+ FV Prox  Full                                                        +---------+---------------+---------+-----------+----------+--------------+ FV Mid   Full                                                        +---------+---------------+---------+-----------+----------+--------------+ FV DistalFull                                                        +---------+---------------+---------+-----------+----------+--------------+ PFV      Full                                                        +---------+---------------+---------+-----------+----------+--------------+ POP      Full           Yes      Yes                                 +---------+---------------+---------+-----------+----------+--------------+ PTV      Full                                                         +---------+---------------+---------+-----------+----------+--------------+ PERO     Full                                                        +---------+---------------+---------+-----------+----------+--------------+   +---------+---------------+---------+-----------+----------+--------------+  LEFT     CompressibilityPhasicitySpontaneityPropertiesThrombus Aging +---------+---------------+---------+-----------+----------+--------------+ CFV      Full           Yes      Yes                                 +---------+---------------+---------+-----------+----------+--------------+ SFJ      Full                                                        +---------+---------------+---------+-----------+----------+--------------+ FV Prox  Full                                                        +---------+---------------+---------+-----------+----------+--------------+ FV Mid   Full                                                        +---------+---------------+---------+-----------+----------+--------------+ FV DistalFull                                                        +---------+---------------+---------+-----------+----------+--------------+ PFV      Full                                                        +---------+---------------+---------+-----------+----------+--------------+ POP      Full           Yes      Yes                                 +---------+---------------+---------+-----------+----------+--------------+ PTV      Full                                                        +---------+---------------+---------+-----------+----------+--------------+ PERO     Full                                                        +---------+---------------+---------+-----------+----------+--------------+     Summary: RIGHT: - There is no evidence of deep vein thrombosis in the lower extremity.  - No cystic structure found in  the popliteal fossa.  LEFT: - There is no evidence of deep vein thrombosis in the lower extremity.  - No  cystic structure found in the popliteal fossa.  *See table(s) above for measurements and observations. Electronically signed by Servando Snare MD on 01/26/2022 at 5:28:02 PM.    Final    CT Chest Wo Contrast  Result Date: 01/26/2022 CLINICAL DATA:  81 year old male with history of chest pain radiating into the upper back. EXAM: CT CHEST WITHOUT CONTRAST TECHNIQUE: Multidetector CT imaging of the chest was performed following the standard protocol without IV contrast. RADIATION DOSE REDUCTION: This exam was performed according to the departmental dose-optimization program which includes automated exposure control, adjustment of the mA and/or kV according to patient size and/or use of iterative reconstruction technique. COMPARISON:  No priors. FINDINGS: Cardiovascular: Heart size is normal. There is no significant pericardial fluid, thickening or pericardial calcification. There is aortic atherosclerosis, as well as atherosclerosis of the great vessels of the mediastinum and the coronary arteries, including calcified atherosclerotic plaque in the left main, left anterior descending, left circumflex and right coronary arteries. Status post median sternotomy for CABG including LIMA to the LAD. Mediastinum/Nodes: No pathologically enlarged mediastinal or hilar lymph nodes. Please note that accurate exclusion of hilar adenopathy is limited on noncontrast CT scans. Esophagus is unremarkable in appearance. No axillary lymphadenopathy. Lungs/Pleura: No acute consolidative airspace disease. No pleural effusions. Cluster of micro nodularity in the basal aspect of the right lower lobe, strongly favored to be benign areas of mucoid impaction within terminal bronchioles. No other larger more suspicious appearing pulmonary nodules or masses are noted. Diffuse bronchial wall thickening with mild centrilobular and paraseptal  emphysema. Upper Abdomen: Aortic atherosclerosis. Calcified gallstones lying dependently in the gallbladder. Multiple low-attenuation lesions in the visualized liver, incompletely characterized on today's noncontrast CT examination, but statistically likely to represent cysts (no imaging follow-up is recommended). Incompletely imaged lesion in the upper pole of the right kidney which is complex in appearance with some faint internal calcifications. Additional 1.6 cm high attenuation lesion in the upper pole of the right kidney also noted. 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 Musculoskeletal: Median sternotomy wires. In the soft tissues of the upper anterior left chest wall adjacent to the first costochondral junction and inferior to the medial left clavicle (axial image 23 of series 3 and coronal image 59 of series 5) there is a partially calcified high density lesion measuring 3.1 x 2.4 x 1.9 cm. There are no aggressive appearing lytic or blastic lesions noted in the visualized portions of the skeleton. IMPRESSION: 1. No acute findings in the thorax to account for the patient's symptoms. 2. Unusual partially calcified soft tissue lesion located between the undersurface of the medial left clavicle in the adjacent left first costochondral junction. This is of uncertain etiology and significance, but is favored to represent a benign area of heterotopic ossification. Given the patient's advanced age, repeat noncontrast chest CT is suggested in 3-6 months to ensure the stability of this finding. 3. Aortic atherosclerosis, in addition to left main and three-vessel coronary artery disease. Status post median sternotomy for CABG including LIMA to the LAD. 4. Diffuse bronchial wall thickening with mild centrilobular and paraseptal emphysema; imaging findings suggestive of underlying COPD. 5. Cholelithiasis. 6.  Additional incidental findings, as above. Aortic Atherosclerosis (ICD10-I70.0) and Emphysema (ICD10-J43.9). Electronically Signed   By: Vinnie Langton M.D.   On: 01/26/2022 09:30    EKG: Independently reviewed.  Sinus rhythm, no acute ST changes.  Assessment/Plan Principal Problem:   Gout  attack  (please populate well all problems here in Problem List. (For example, if patient is on BP meds at home and you resume or decide to hold them, it is a problem that needs to be her. Same for CAD, COPD, HLD and so on)  Acute gouty attack -Multiple joints involved especially bilateral knees and bilateral ankles and feet. -Symptom onset> 48 hours ago, and patient has a CKD stage IV, will not increase his colchicine dosage.  Decided to start patient on steroid milligrams/KG, start 40 mg twice daily for now, once symptoms improved, consider titrating down in 7-10 days.  Explained to patient daughter over the phone, who expressed understanding and agreed.  Also discussed with family regarding strategy for gout prophylaxis with allopurinol, family will discuss the issue with PCP. -Other DDx, given the features of progressive multiple joint involved, less likely septic arthritis.  Blood culture sent in the ED, will not start antibiotics for now. Unclear whether this episode of gout flareup is related to COVID immunization. -Check xray of knee and ankles. -PT evaluation  Leukocytosis/SIRS -Likely secondary to gout flareup, low suspicion for sepsis.  Hypokalemia -P.o. replacement, check K level tomorrow  Hyponatremia -Mild, mild related to CKD.  Asymptomatic, monitor level.  HTN -Stable, continue home BP meds  CKD stage IV -Euvolemic, BP controlled, creatinine level above baseline. -With non-anion gap metabolic acidosis, start bicarb p.o.  IIDM -Sliding scale for now  Chronic HFpEF -Euvolemic, BP controlled    DVT prophylaxis: Heparin subcu Code Status: Full code Family Communication:  Daughter over the phone Disposition Plan: Expect more than 2 midnight hospital stay, given the symptoms of severe multiple joint pain and acute ambulation dysfunction. Consults called: None Admission status: Telemetry admission   Lequita Halt MD [facility] Pager [phone]  01/27/2022, 6:46 PM

## 2022-01-27 NOTE — [place] FL2 (Signed)
[place] MEDICAID FL2 LEVEL OF CARE SCREENING TOOL     IDENTIFICATION  Patient Name: Harold Pittman Birthdate: 1940-10-03 Sex: male Admission Date (Current Location): 01/25/2022  [facility] and [place] Number:  Herbalist and Address:  [facility] [place] [place] [address], [place], [place] [address]      Provider Number: [id number]  Attending Physician Name and Address:  Lajean Saver, MD  Relative Name and Phone Number:       Current Level of Care: Hospital Recommended Level of Care: [place] Prior Approval Number:    Date Approved/Denied:   PASRR Number: [id number]  Discharge Plan: SNF    Current Diagnoses: Patient Active Problem List   Diagnosis Date Noted   Peripheral neuropathy 01/15/2022   Ataxia 01/15/2022   Type 2 diabetes mellitus (A1c 6.0 on 05/03/2021) with steroid-induced hyperglycemia 05/16/2021   CKD (chronic kidney disease) stage 4, GFR 15-29 ml/min (HCC) 05/15/2021   Olecranon bursitis, right elbow 05/15/2021   Tremor 05/15/2021   Elbow pain, left 12/05/2020   Constipation 12/05/2020   Atherosclerosis of aorta ([place]) 12/05/2020   Chest pain, atypical 12/05/2020   CTS (carpal tunnel syndrome) 03/24/2020   Grief 03/24/2020   Cholelithiasis 09/16/2019   Nausea 09/02/2019   Weight loss 09/02/2019   Elevated troponin 01/12/2019   Hypertensive urgency 01/10/2019   Well adult exam 10/20/2018   Balanitis 10/20/2018   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/27/2017   Adenoma of stomach 12/25/2017   Abnormal findings on esophagogastroduodenoscopy (EGD) 12/25/2017   Sebaceous cyst 10/04/2017   Iron deficiency anemia    Benign neoplasm of sigmoid colon    Benign neoplasm of cecum    Benign neoplasm of transverse colon    Positive occult stool blood test 09/24/2017   (HFpEF) heart failure with preserved ejection fraction ([place]) 09/24/2017   Hypokalemia 08/17/2015   Generalized weakness 07/22/2015    Gait disorder 03/18/2014   Low back pain 01/12/2014   History of colon polyps 12/17/2013   DM (diabetes mellitus), type 2 with peripheral vascular complications ([place]) 20/60/1561   Diarrhea 10/08/2012   Edema 10/08/2011   DOE (dyspnea on exertion) 06/29/2011   LATERAL EPICONDYLITIS, LEFT 02/20/2010   TOBACCO USE, QUIT 02/23/2009   Cough 02/16/2009   Hearing loss 10/15/2008   ERECTILE DYSFUNCTION 04/30/2008   Coronary artery disease involving native coronary artery of native heart without angina pectoris 04/30/2008   COPD (chronic obstructive pulmonary disease) ([place]) 05/21/2007   Anxiety state 05/20/2007   Insomnia 03/04/2007   Hyperlipidemia LDL goal <70 02/01/2007   Gout 02/01/2007   Essential hypertension 02/01/2007   GERD 02/01/2007   Osteoarthritis 02/01/2007    Orientation RESPIRATION BLADDER Height & Weight     Self, Time, Situation, Place  Normal Continent, External catheter Weight: 175 lb (79.4 kg) Height:  6' (182.9 cm)  BEHAVIORAL SYMPTOMS/MOOD NEUROLOGICAL BOWEL NUTRITION STATUS      Continent Diet  AMBULATORY STATUS COMMUNICATION OF NEEDS Skin   Extensive Assist Verbally Normal                       Personal Care Assistance Level of Assistance  Bathing, Dressing, Feeding Bathing Assistance: Limited assistance Feeding assistance: Maximum assistance Dressing Assistance: Limited assistance     Functional Limitations Info  Sight, Speech, Hearing Sight Info: Adequate Hearing Info: Adequate Speech Info: Adequate    SPECIAL CARE FACTORS FREQUENCY  PT (By licensed PT), OT (By licensed OT)  PT Frequency: 5x weekly OT Frequency: 5x weekly            Contractures Contractures Info: Not present    Additional Factors Info  Allergies, Code Status Code Status Info: Full Code Allergies Info: Benazepril, Gabapentin           Current Medications (01/27/2022):  This is the current hospital active medication list Current Facility-Administered  Medications  Medication Dose Route Frequency Provider Last Rate Last Admin   amLODipine (NORVASC) tablet 5 mg  5 mg Oral Daily Tegeler, Gwenyth Allegra, MD   5 mg at 01/27/22 3762   aspirin EC tablet 81 mg  81 mg Oral Daily Tegeler, Gwenyth Allegra, MD   81 mg at 01/27/22 0923   carvedilol (COREG) tablet 25 mg  25 mg Oral BID WC Dion Saucier A, PA   25 mg at 01/26/22 1648   carvedilol (COREG) tablet 25 mg  25 mg Oral BID WC Tegeler, Gwenyth Allegra, MD   25 mg at 01/27/22 0825   famotidine (PEPCID) tablet 40 mg  40 mg Oral Daily Tegeler, Gwenyth Allegra, MD   40 mg at 01/27/22 8315   pravastatin (PRAVACHOL) tablet 80 mg  80 mg Oral QHS Tegeler, Gwenyth Allegra, MD   80 mg at 01/27/22 0045   torsemide (DEMADEX) tablet 100 mg  100 mg Oral Daily Dion Saucier A, PA   100 mg at 01/27/22 1761   torsemide (DEMADEX) tablet 100 mg  100 mg Oral Daily Tegeler, Gwenyth Allegra, MD       Current Outpatient Medications  Medication Sig Dispense Refill   cyclobenzaprine (FLEXERIL) 10 MG tablet Take 0.5 tablets (5 mg total) by mouth 2 (two) times daily as needed for muscle spasms. 10 tablet 0   amLODipine (NORVASC) 10 MG tablet Take 0.5 tablets (5 mg total) by mouth daily. 90 tablet 1   aspirin 81 MG EC tablet Take 1 tablet (81 mg total) by mouth daily. 100 tablet 3   B Complex-Folic Acid (B COMPLEX PLUS) TABS Take 1 tablet by mouth daily. 100 tablet 3   Blood Glucose Monitoring Suppl (ONETOUCH VERIO FLEX SYSTEM) w/Device KIT USE TO MONITOR BLOOD SUGAR 1 kit 0   carvedilol (COREG) 25 MG tablet Take 1 tablet (25 mg total) by mouth 2 (two) times daily with a meal. 180 tablet 3   Cholecalciferol (VITAMIN D3) 50 MCG (2000 UT) capsule Take 1 capsule (2,000 Units total) by mouth daily. 100 capsule 3   colchicine 0.6 MG tablet TAKE 1 TABLET(0.6 MG) BY MOUTH DAILY 90 tablet 1   Cyanocobalamin (VITAMIN B-12) 1000 MCG SUBL Place 1 tablet (1,000 mcg total) under the tongue daily. 100 tablet 3   docusate sodium (COLACE) 100 MG  capsule Take 1 capsule (100 mg total) by mouth daily as needed for mild constipation. 100 capsule 3   famotidine (PEPCID) 40 MG tablet Take 1 tablet (40 mg total) by mouth daily. 90 tablet 3   glucose blood (ONETOUCH VERIO) test strip CHECK BLOOD SUGAR 1 TO 2  TIMES DAILY AS DIRECTED 200 strip 3   HYDROcodone-acetaminophen (NORCO) 5-325 MG tablet Take 0.5-1 tablets by mouth every 6 (six) hours as needed for severe pain. 20 tablet 0   Lancets (ONETOUCH DELICA PLUS [id number]) MISC USE AS DIRECTED 200 each 3   omeprazole (PRILOSEC) 40 MG capsule Take 1 capsule (40 mg total) by mouth daily. 90 capsule 3   ondansetron (ZOFRAN) 4 MG tablet Take 1 tablet (4 mg total) by mouth every 8 (  eight) hours as needed for nausea or vomiting. 21 tablet 1   potassium chloride (KLOR-CON) 10 MEQ tablet TAKE 1 TABLET(10 MEQ) BY MOUTH DAILY 7 tablet 0   pravastatin (PRAVACHOL) 80 MG tablet Take 1 tablet (80 mg total) by mouth at bedtime. 90 tablet 3   repaglinide (PRANDIN) 1 MG tablet 1 po before or with dinner 90 tablet 3   torsemide (DEMADEX) 100 MG tablet TAKE 1 TABLET BY MOUTH DAILY 90 tablet 3     Discharge Medications: Please see discharge summary for a list of discharge medications.  Relevant Imaging Results:  Relevant Lab Results:   Additional Information SSN: [id number]  Archie Endo, LCSW

## 2022-01-27 NOTE — Evaluation (Signed)
Physical Therapy Evaluation Patient Details Name: Harold Pittman MRN: [id number] DOB: 06-04-1940 Today's Date: 01/27/2022  History of Present Illness  81 y.o. male [facility] ED on 01/25/2022 with chest pain. Workup negative for acute findings. Pt also with general weakness and inability to ambulate. PMHx significant for COPD, CAD s/p CAGB, HTN, DMII, CKD IV, anemia and diastolic CHF.  Clinical Impression  Pt presents to PT with deficits in functional mobility, ROM, strength, power, gait, balance. Pt has significant R knee pain which limits knee flexion and mobility. Pt is unable to stand at this time despite significant assistance from this PT. Pt will benefit from aggressive mobilization in an effort to reduce falls risk. PT recommends SNF placement at this time.       Recommendations for follow up therapy are one component of a multi-disciplinary discharge planning process, led by the attending physician.  Recommendations may be updated based on patient status, additional functional criteria and insurance authorization.  Follow Up Recommendations Skilled nursing-short term rehab (<3 hours/day) Can patient physically be transported by private vehicle: No    Assistance Recommended at Discharge Intermittent Supervision/Assistance  Patient can return home with the following  Two people to help with walking and/or transfers;Two people to help with bathing/dressing/bathroom;Assistance with cooking/housework;Assist for transportation;Help with stairs or ramp for entrance    Equipment Recommendations  (TBD)  Recommendations for Other Services       Functional Status Assessment Patient has had a recent decline in their functional status and demonstrates the ability to make significant improvements in function in a reasonable and predictable amount of time.     Precautions / Restrictions Precautions Precautions: Fall Restrictions Weight Bearing Restrictions: No      Mobility  Bed  Mobility Overal bed mobility: Needs Assistance Bed Mobility: Supine to Sit, Sit to Supine     Supine to sit: Mod assist, HOB elevated Sit to supine: Max assist        Transfers Overall transfer level: Needs assistance Equipment used: Rolling walker (2 wheels), 1 person hand held assist Transfers: Sit to/from Stand Sit to Stand:  (unable to stand from elevated surface despite maxA from PT. Pt limited by weakness and BLE pain)                Ambulation/Gait                  Stairs            Wheelchair Mobility    Modified Rankin (Stroke Patients Only)       Balance Overall balance assessment: Needs assistance Sitting-balance support: Single extremity supported, Bilateral upper extremity supported, Feet supported Sitting balance-Leahy Scale: Poor Sitting balance - Comments: posterior lean, minA progressing to minG Postural control: Posterior lean                                   Pertinent Vitals/Pain Pain Assessment Pain Assessment: Faces Faces Pain Scale: Hurts whole lot Pain Location: R knee Pain Descriptors / Indicators: Grimacing Pain Intervention(s): Monitored during session    Home Living Family/patient expects to be discharged to:: Private residence Living Arrangements: Alone Available Help at Discharge: Family;Available PRN/intermittently Type of Home: House Home Access: Stairs to enter Entrance Stairs-Rails: Can reach both Entrance Stairs-Number of Steps: 1   Home Layout: One level Home Equipment: Cane - single Location manager (2 wheels)      Prior Function  Prior Level of Function : Independent/Modified Independent;Driving             Mobility Comments: ambulates with use of RW inside and SPC outdoors       Hand Dominance   Dominant Hand: Right    Extremity/Trunk Assessment   Upper Extremity Assessment Upper Extremity Assessment: Generalized weakness    Lower Extremity  Assessment Lower Extremity Assessment: Generalized weakness (R knee pain limiting active and passive flexion)    Cervical / Trunk Assessment Cervical / Trunk Assessment: Kyphotic  Communication   Communication: No difficulties  Cognition Arousal/Alertness: Awake/alert Behavior During Therapy: WFL for tasks assessed/performed Overall Cognitive Status: No family/caregiver present to determine baseline cognitive functioning                                 General Comments: may be at baseline, slowed processing othwewise appropriate        General Comments General comments (skin integrity, edema, etc.): VSS on RA    Exercises General Exercises - Lower Extremity Ankle Circles/Pumps: AROM, Both, 5 reps Heel Slides: AROM, Both, 5 reps Straight Leg Raises: AROM, Both, 5 reps   Assessment/Plan    PT Assessment Patient needs continued PT services  PT Problem List Decreased strength;Decreased activity tolerance;Decreased balance;Decreased mobility;Decreased range of motion;Pain       PT Treatment Interventions DME instruction;Gait training;Functional mobility training;Therapeutic activities;Therapeutic exercise;Balance training;Stair training;Neuromuscular re-education;Patient/family education    PT Goals (Current goals can be found in the Care Plan section)  Acute Rehab PT Goals Patient Stated Goal: to improve strength and return to walking PT Goal Formulation: With patient Time For Goal Achievement: 02/10/22 Potential to Achieve Goals: Fair    Frequency Min 2X/week     Co-evaluation               AM-PAC PT "6 Clicks" Mobility  Outcome Measure Help needed turning from your back to your side while in a flat bed without using bedrails?: A Lot Help needed moving from lying on your back to sitting on the side of a flat bed without using bedrails?: A Lot Help needed moving to and from a bed to a chair (including a wheelchair)?: Total Help needed standing up  from a chair using your arms (e.g., wheelchair or bedside chair)?: Total Help needed to walk in hospital room?: Total Help needed climbing 3-5 steps with a railing? : Total 6 Click Score: 8    End of Session   Activity Tolerance: Patient tolerated treatment well Patient left: in bed;with call bell/phone within reach Nurse Communication: Mobility status PT Visit Diagnosis: Other abnormalities of gait and mobility (R26.89);Muscle weakness (generalized) (M62.81)    Time: 9179-1505 PT Time Calculation (min) (ACUTE ONLY): 28 min   Charges:   PT Evaluation $PT Eval Low Complexity: [place], PT, DPT Acute Rehabilitation Office [phone]   Zenaida Niece 01/27/2022, 3:09 PM

## 2022-01-27 NOTE — ED Notes (Signed)
Notified Steinl MD of oral temp 103.1

## 2022-01-28 ENCOUNTER — Encounter (HOSPITAL_COMMUNITY): Payer: Self-pay | Admitting: Internal Medicine

## 2022-01-28 ENCOUNTER — Other Ambulatory Visit: Payer: Self-pay

## 2022-01-28 DIAGNOSIS — E1151 Type 2 diabetes mellitus with diabetic peripheral angiopathy without gangrene: Secondary | ICD-10-CM | POA: Diagnosis not present

## 2022-01-28 DIAGNOSIS — I5032 Chronic diastolic (congestive) heart failure: Secondary | ICD-10-CM | POA: Diagnosis not present

## 2022-01-28 DIAGNOSIS — I10 Essential (primary) hypertension: Secondary | ICD-10-CM | POA: Diagnosis not present

## 2022-01-28 DIAGNOSIS — M1009 Idiopathic gout, multiple sites: Secondary | ICD-10-CM | POA: Diagnosis not present

## 2022-01-28 DIAGNOSIS — M109 Gout, unspecified: Secondary | ICD-10-CM | POA: Diagnosis present

## 2022-01-28 LAB — CBC
HCT: 27.1 % — ABNORMAL LOW (ref 39.0–52.0)
Hemoglobin: 9.3 g/dL — ABNORMAL LOW (ref 13.0–17.0)
MCH: 28.7 pg (ref 26.0–34.0)
MCHC: 34.3 g/dL (ref 30.0–36.0)
MCV: 83.6 fL (ref 80.0–100.0)
Platelets: 226 10*3/uL (ref 150–400)
RBC: 3.24 MIL/uL — ABNORMAL LOW (ref 4.22–5.81)
RDW: 13.3 % (ref 11.5–15.5)
WBC: 16.5 10*3/uL — ABNORMAL HIGH (ref 4.0–10.5)
nRBC: 0 % (ref 0.0–0.2)

## 2022-01-28 LAB — GLUCOSE, CAPILLARY
Glucose-Capillary: 156 mg/dL — ABNORMAL HIGH (ref 70–99)
Glucose-Capillary: 203 mg/dL — ABNORMAL HIGH (ref 70–99)
Glucose-Capillary: 176 mg/dL — ABNORMAL HIGH (ref 70–99)
Glucose-Capillary: 171 mg/dL — ABNORMAL HIGH (ref 70–99)

## 2022-01-28 LAB — BASIC METABOLIC PANEL
Anion gap: 14 (ref 5–15)
BUN: 44 mg/dL — ABNORMAL HIGH (ref 8–23)
CO2: 25 mmol/L (ref 22–32)
Calcium: 8.8 mg/dL — ABNORMAL LOW (ref 8.9–10.3)
Chloride: 98 mmol/L (ref 98–111)
Creatinine, Ser: 2.8 mg/dL — ABNORMAL HIGH (ref 0.61–1.24)
GFR, Estimated: 22 mL/min — ABNORMAL LOW (ref >60)
Glucose, Bld: 192 mg/dL — ABNORMAL HIGH (ref 70–99)
Potassium: 3.5 mmol/L (ref 3.5–5.1)
Sodium: 137 mmol/L (ref 135–145)

## 2022-01-28 LAB — URINE CULTURE: Culture: <10000 — AB

## 2022-01-28 LAB — HEMOGLOBIN A1C
Hgb A1c MFr Bld: 5.5 % (ref 4.8–5.6)
Mean Plasma Glucose: 111.15 mg/dL

## 2022-01-28 MED ORDER — POTASSIUM CHLORIDE 20 MEQ PO PACK
40.0000 meq | PACK | Freq: Once | ORAL | Status: AC
  Administered 2022-01-28: 40 meq via ORAL
  Filled 2022-01-28: qty 2

## 2022-01-28 MED ORDER — REPAGLINIDE 1 MG PO TABS
0.5000 mg | ORAL_TABLET | Freq: Every day | ORAL | Status: DC
  Administered 2022-01-28 – 2022-01-30 (×3): 0.5 mg via ORAL
  Filled 2022-01-28 (×5): qty 0.5

## 2022-01-28 MED ORDER — INSULIN DETEMIR 100 UNIT/ML ~~LOC~~ SOLN
5.0000 [IU] | Freq: Two times a day (BID) | SUBCUTANEOUS | Status: DC
  Administered 2022-01-28 – 2022-01-31 (×7): 5 [IU] via SUBCUTANEOUS
  Filled 2022-01-28 (×8): qty 0.05

## 2022-01-28 MED ORDER — INSULIN ASPART 100 UNIT/ML IJ SOLN
2.0000 [IU] | Freq: Three times a day (TID) | INTRAMUSCULAR | Status: DC
  Administered 2022-01-28 – 2022-01-30 (×8): 2 [IU] via SUBCUTANEOUS

## 2022-01-28 MED ORDER — INSULIN ASPART 100 UNIT/ML IJ SOLN
0.0000 [IU] | Freq: Three times a day (TID) | INTRAMUSCULAR | Status: DC
  Administered 2022-01-28: 2 [IU] via SUBCUTANEOUS
  Administered 2022-01-28: 3 [IU] via SUBCUTANEOUS
  Administered 2022-01-29: 2 [IU] via SUBCUTANEOUS
  Administered 2022-01-29: 1 [IU] via SUBCUTANEOUS
  Administered 2022-01-29: 2 [IU] via SUBCUTANEOUS
  Administered 2022-01-30: 1 [IU] via SUBCUTANEOUS
  Administered 2022-01-30: 2 [IU] via SUBCUTANEOUS
  Administered 2022-01-30: 3 [IU] via SUBCUTANEOUS

## 2022-01-28 MED ORDER — INSULIN ASPART 100 UNIT/ML IJ SOLN
0.0000 [IU] | Freq: Every day | INTRAMUSCULAR | Status: DC
  Administered 2022-01-30: 2 [IU] via SUBCUTANEOUS

## 2022-01-28 MED ORDER — POLYETHYLENE GLYCOL 3350 17 G PO PACK
17.0000 g | PACK | Freq: Two times a day (BID) | ORAL | Status: DC
  Administered 2022-01-28 – 2022-01-29 (×3): 17 g via ORAL
  Filled 2022-01-28 (×3): qty 1

## 2022-01-28 NOTE — Progress Notes (Signed)
[facility] PROGRESS NOTE    Progress Note  Harold Pittman  POE:[id number] DOB: 08/23/1940 DOA: 01/25/2022 PCP: Cassandria Anger, MD     Brief Narrative:   Harold Pittman is an 81 y.o. 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   Assessment/Plan:   Polyarticular gout flare: Continues for 48 hours in the setting of chronic kidney disease stage IV, agree with steroids. A candidate for allopurinol due to his renal dysfunction. Imaging showed erosion of the distal tubular with swelling and edema of the right ankle,  SIRS likely due to gout flare: See above for the details he is not septic. His leukocytosis will persist due to steroids.  Hypokalemia:  Now proved try to keep greater than 4.  Hyponatremia mild: Asymptomatic.  Now resolved after fluid resuscitation.  Essential hypertension: Stable continue current medication.  Chronic kidney disease stage IV: Noted at baseline.  Insulin-dependent diabetes mellitus type 2: Blood glucose will become erratic with the steroids, will long-acting swelling and increase his sliding scale.  Chronic diastolic heart failure: Appears to be euvolemic blood pressure is controlled.   DVT prophylaxis: lovenox Family Communication:none Status is: Inpatient Remains inpatient appropriate because: poli articular gout flare    Code Status:     Code Status Orders  (From admission, onward)           Start     Ordered   01/27/22 1817  Full code  Continuous        01/27/22 1817           Code  Status History     Date Active Date Inactive Code Status Order ID Comments User Context   05/16/2021 0711 05/18/2021 1839 Full Code [id number]  Shela Leff, MD ED   01/10/2019 0730 01/12/2019 1502 Full Code [id number]  Norval Morton, MD ED   09/24/2017 2323 09/26/2017 1906 Full Code [id number]  Vianne Bulls, MD ED   07/22/2015 1714 07/26/2015 1635 Full Code [id number]  Rondel Jumbo, PA-C ED         IV Access:   Peripheral IV   Procedures and diagnostic studies:   DG Ankle 2 Views Right  Result Date: 01/27/2022 CLINICAL DATA:  Right ankle pain. EXAM: RIGHT ANKLE - 2 VIEW COMPARISON:  None Available. FINDINGS: There is no evidence of fracture, dislocation, or joint effusion. No significant joint space narrowing is noted. However, there does appear to be erosion involving the distal fibula with overlying soft tissue swelling or edema which may represent gout. IMPRESSION: Possible erosion involving distal fibula with overlying soft tissue swelling or edema suggesting possible gout. No fracture or dislocation is noted. Electronically Signed   By: Marijo Conception M.D.   On: 01/27/2022 19:40   DG Ankle 2 Views Left  Result Date: 01/27/2022 CLINICAL DATA:  Left ankle pain. EXAM: LEFT ANKLE - 2 VIEW COMPARISON:  None Available. FINDINGS: There is no evidence of fracture, dislocation, or joint effusion. There is no evidence of arthropathy or other focal bone abnormality. Soft tissues are unremarkable. IMPRESSION: Negative. Electronically Signed   By: Jeneen Rinks  Murlean Caller M.D.   On: 01/27/2022 19:38   DG Knee 1-2 Views Right  Result Date: 01/27/2022 CLINICAL DATA:  Right knee pain. EXAM: RIGHT KNEE - 1-2 VIEW COMPARISON:  None Available. FINDINGS: No evidence of fracture or dislocation. Small suprapatellar joint effusion is noted. Moderate narrowing of medial joint space is noted. Soft tissues are unremarkable. 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 Small suprapatellar  joint effusion. No fracture or dislocation. Electronically Signed   By: Marijo Conception M.D.   On: 01/27/2022 19:37   DG Knee 1-2 Views Left  Result Date: 01/27/2022 CLINICAL DATA:  Left knee pain. EXAM: LEFT KNEE - 1-2 VIEW COMPARISON:  None Available. FINDINGS: No evidence of fracture, dislocation, or joint effusion. Mild narrowing of medial joint space is noted. Soft tissues are unremarkable. IMPRESSION: Mild degenerative joint disease is noted medially. No acute abnormality seen. Electronically Signed   By: Marijo Conception M.D.   On: 01/27/2022 19:36   CT Abdomen Pelvis Wo Contrast  Result Date: 01/27/2022 CLINICAL DATA:  81 year old male with acute abdominal and pelvic pain and fever. EXAM: CT ABDOMEN AND PELVIS WITHOUT CONTRAST TECHNIQUE: Multidetector CT imaging of the abdomen and pelvis was performed following the standard protocol without IV contrast. RADIATION DOSE REDUCTION: This exam was performed according to the departmental dose-optimization program which includes automated exposure control, adjustment of the mA and/or kV according to patient size and/or use of iterative reconstruction technique. COMPARISON:  10/30/2005 CT.  03/31/2021 MR FINDINGS: Please note that parenchymal and vascular abnormalities may be missed as intravenous contrast was not administered. Lower chest: No acute abnormality. Hepatobiliary: Cholelithiasis identified without CT evidence of acute cholecystitis. Hepatic cysts are again identified without significant hepatic abnormalities. There is no evidence of intrahepatic or extrahepatic biliary dilatation. Pancreas: Unremarkable Spleen: Unremarkable Adrenals/Urinary Tract: Multiple bilateral renal cysts are again identified and no follow-up imaging recommended. There is no evidence of hydronephrosis or urinary calculi. The adrenal glands and bladder are unremarkable. Stomach/Bowel: Stomach is within normal limits. Appendix appears normal. No evidence of bowel wall  thickening, distention, or inflammatory changes. Vascular/Lymphatic: Aortic atherosclerosis. No enlarged abdominal or pelvic lymph nodes. Reproductive: Prostate enlargement again noted. Other: No ascites, focal collection or pneumoperitoneum. Musculoskeletal: No acute or suspicious bony abnormalities are noted. L5-S1 degenerative disc disease/spondylosis again noted. IMPRESSION: 1. No evidence of acute abnormality. 2. Cholelithiasis without CT evidence of acute cholecystitis. 3. Prostate enlargement. 4.  Aortic Atherosclerosis (ICD10-I70.0). Electronically Signed   By: Margarette Canada M.D.   On: 01/27/2022 13:17   VAS [place] LOWER EXTREMITY VENOUS (DVT) (ONLY [facility] & [facility])  Result Date: 01/26/2022  Lower Venous DVT Study Patient Name:  JAKAYDEN CANCIO  Date of Exam:   01/26/2022 Medical Rec #: [id number]        Accession #:    [id number] Date of Birth: May 26, 1940         Patient Gender: M Patient Age:   32 years Exam Location:  [facility] Procedure:      VAS [place] LOWER EXTREMITY VENOUS (DVT) Referring Phys: COOPER ROBBINS --------------------------------------------------------------------------------  Indications: Edema.  Comparison Study: No prior study Performing Technologist: Maudry Mayhew MHA, RDMS, RVT, RDCS  Examination Guidelines: A complete evaluation includes B-mode imaging, spectral Doppler, color Doppler, and power Doppler as needed of all accessible portions of each vessel. Bilateral testing is considered an integral part of a complete examination. Limited examinations for reoccurring indications may be performed as noted. The reflux portion of the exam is  performed with the patient in reverse Trendelenburg.  +---------+---------------+---------+-----------+----------+--------------+ RIGHT    CompressibilityPhasicitySpontaneityPropertiesThrombus Aging +---------+---------------+---------+-----------+----------+--------------+ CFV      Full           Yes      Yes                                  +---------+---------------+---------+-----------+----------+--------------+ SFJ      Full                                                        +---------+---------------+---------+-----------+----------+--------------+ FV Prox  Full                                                        +---------+---------------+---------+-----------+----------+--------------+ FV Mid   Full                                                        +---------+---------------+---------+-----------+----------+--------------+ FV DistalFull                                                        +---------+---------------+---------+-----------+----------+--------------+ PFV      Full                                                        +---------+---------------+---------+-----------+----------+--------------+ POP      Full           Yes      Yes                                 +---------+---------------+---------+-----------+----------+--------------+ PTV      Full                                                        +---------+---------------+---------+-----------+----------+--------------+ PERO     Full                                                        +---------+---------------+---------+-----------+----------+--------------+   +---------+---------------+---------+-----------+----------+--------------+ LEFT     CompressibilityPhasicitySpontaneityPropertiesThrombus Aging +---------+---------------+---------+-----------+----------+--------------+ CFV      Full           Yes      Yes                                 +---------+---------------+---------+-----------+----------+--------------+  SFJ      Full                                                        +---------+---------------+---------+-----------+----------+--------------+ FV Prox  Full                                                         +---------+---------------+---------+-----------+----------+--------------+ FV Mid   Full                                                        +---------+---------------+---------+-----------+----------+--------------+ FV DistalFull                                                        +---------+---------------+---------+-----------+----------+--------------+ PFV      Full                                                        +---------+---------------+---------+-----------+----------+--------------+ POP      Full           Yes      Yes                                 +---------+---------------+---------+-----------+----------+--------------+ PTV      Full                                                        +---------+---------------+---------+-----------+----------+--------------+ PERO     Full                                                        +---------+---------------+---------+-----------+----------+--------------+     Summary: RIGHT: - There is no evidence of deep vein thrombosis in the lower extremity.  - No cystic structure found in the popliteal fossa.  LEFT: - There is no evidence of deep vein thrombosis in the lower extremity.  - No cystic structure found in the popliteal fossa.  *See table(s) above for measurements and observations. Electronically signed by Servando Snare MD on 01/26/2022 at 5:28:02 PM.    Final      Medical Consultants:   None.   Subjective:    Marsh Dolly Eckels relates his pain is improved compared to yesterday.  Objective:    Vitals:  01/28/22 0021 01/28/22 0434 01/28/22 0513 01/28/22 0821  BP: (!) 127/101 119/62  121/72  Pulse: 73 68  69  Resp: 20 19  (!) 21  Temp:  98.6 F (37 C)  98.2 F (36.8 C)  TempSrc:  Oral  Oral  SpO2: 95% 95%  98%  Weight:   79.4 kg   Height:   6' (1.829 m)    SpO2: 98 %   Intake/Output Summary (Last 24 hours) at 01/28/2022 0916 Last data filed at 01/28/2022 0434 Gross per  24 hour  Intake 1500 ml  Output 1200 ml  Net 300 ml   Filed Weights   01/25/22 1510 01/28/22 0513  Weight: 79.4 kg 79.4 kg    Exam: General exam: In no acute distress. Respiratory system: Good air movement and clear to auscultation. Cardiovascular system: S1 & S2 heard, RRR. No JVD. Gastrointestinal system: Abdomen is nondistended, soft and nontender.  Extremities: No pedal edema. Skin: No rashes, lesions or ulcers Psychiatry: Judgement and insight appear normal. Mood & affect appropriate.    Data Reviewed:    Labs: Basic Metabolic Panel: Recent Labs  Lab 01/25/22 1527 01/27/22 0859 01/28/22 0300  NA 141 134* 137  K 3.7 3.2* 3.5  CL 100 98 98  CO2 25 16* 25  GLUCOSE 111* 169* 192*  BUN 33* 36* 44*  CREATININE 2.56* 2.43* 2.80*  CALCIUM 10.0 8.5* 8.8*   GFR Estimated Creatinine Clearance: 23.1 mL/min (A) (by C-G formula based on SCr of 2.8 mg/dL (H)). Liver Function Tests: Recent Labs  Lab 01/25/22 1527 01/27/22 0859  AST 20 21  ALT 13 13  ALKPHOS 84 72  BILITOT 1.1 1.3*  PROT 7.4 6.8  ALBUMIN 4.1 3.4*   No results for input(s): "LIPASE", "AMYLASE" in the last 168 hours. No results for input(s): "AMMONIA" in the last 168 hours. Coagulation profile No results for input(s): "INR", "PROTIME" in the last 168 hours. COVID-19 Labs  No results for input(s): "DDIMER", "FERRITIN", "LDH", "CRP" in the last 72 hours.  Lab Results  Component Value Date   SARSCOV2NAA NEGATIVE 01/27/2022   SARSCOV2NAA NEGATIVE 01/26/2022   SARSCOV2NAA NEGATIVE 05/15/2021   [place] NEGATIVE 01/10/2019    CBC: Recent Labs  Lab 01/25/22 1527 01/27/22 0859 01/27/22 2116 01/28/22 0300  WBC 9.0 16.9*  --  16.5*  NEUTROABS  --   --  15.0*  --   HGB 11.3* 10.5*  --  9.3*  HCT 32.9* 30.9*  --  27.1*  MCV 84.6 84.2  --  83.6  PLT 286 278  --  226   Cardiac Enzymes: No results for input(s): "CKTOTAL", "CKMB", "CKMBINDEX", "TROPONINI" in the last 168 hours. BNP (last 3  results) No results for input(s): "PROBNP" in the last 8760 hours. CBG: Recent Labs  Lab 01/26/22 0124 01/27/22 2106 01/28/22 0812  GLUCAP 158* 210* 156*   D-Dimer: No results for input(s): "DDIMER" in the last 72 hours. Hgb A1c: No results for input(s): "HGBA1C" in the last 72 hours. Lipid Profile: No results for input(s): "CHOL", "HDL", "LDLCALC", "TRIG", "CHOLHDL", "LDLDIRECT" in the last 72 hours. Thyroid function studies: No results for input(s): "TSH", "T4TOTAL", "T3FREE", "THYROIDAB" in the last 72 hours.  Invalid input(s): "FREET3" Anemia work up: No results for input(s): "VITAMINB12", "FOLATE", "FERRITIN", "TIBC", "IRON", "RETICCTPCT" in the last 72 hours. Sepsis Labs: Recent Labs  Lab 01/25/22 1527 01/27/22 0859 01/28/22 0300  WBC 9.0 16.9* 16.5*  LATICACIDVEN  --  1.3  --    Microbiology Recent Results (  from the past 240 hour(s))  Resp Panel by RT-PCR (Flu A&B, Covid) Anterior Nasal Swab     Status: None   Collection Time: 01/26/22  3:42 PM   Specimen: Anterior Nasal Swab  Result Value Ref Range Status   SARS Coronavirus 2 by RT PCR NEGATIVE NEGATIVE Final    Comment: (NOTE) SARS-CoV-2 target nucleic acids are NOT DETECTED.  The SARS-CoV-2 RNA is generally detectable in upper respiratory specimens during the acute phase of infection. The lowest concentration of SARS-CoV-2 viral copies this assay can detect is 138 copies/mL. A negative result does not preclude SARS-Cov-2 infection and should not be used as the sole basis for treatment or other patient management decisions. A negative result may occur with  improper specimen collection/handling, submission of specimen other than nasopharyngeal swab, presence of viral mutation(s) within the areas targeted by this assay, and inadequate number of viral copies(<138 copies/mL). A negative result must be combined with clinical observations, patient history, and epidemiological information. The expected result is  Negative.  Fact Sheet for Patients:  EntrepreneurPulse.com.au  Fact Sheet for Healthcare Providers:  IncredibleEmployment.be  This test is no t yet approved or cleared by the [place] FDA and  has been authorized for detection and/or diagnosis of SARS-CoV-2 by FDA under an Emergency Use Authorization (EUA). This EUA will remain  in effect (meaning this test can be used) for the duration of the COVID-19 declaration under Section 564(b)(1) of the Act, 21 U.S.C.section 360bbb-3(b)(1), unless the authorization is terminated  or revoked sooner.       Influenza A by PCR NEGATIVE NEGATIVE Final   Influenza B by PCR NEGATIVE NEGATIVE Final    Comment: (NOTE) The Xpert Xpress SARS-CoV-2/FLU/RSV plus assay is intended as an aid in the diagnosis of influenza from Nasopharyngeal swab specimens and should not be used as a sole basis for treatment. Nasal washings and aspirates are unacceptable for Xpert Xpress SARS-CoV-2/FLU/RSV testing.  Fact Sheet for Patients: EntrepreneurPulse.com.au  Fact Sheet for Healthcare Providers: IncredibleEmployment.be  This test is not yet approved or cleared by the [place] FDA and has been authorized for detection and/or diagnosis of SARS-CoV-2 by FDA under an Emergency Use Authorization (EUA). This EUA will remain in effect (meaning this test can be used) for the duration of the COVID-19 declaration under Section 564(b)(1) of the Act, 21 U.S.C. section 360bbb-3(b)(1), unless the authorization is terminated or revoked.  Performed at [facility], [place] [address] [place], [place] [address]   Blood culture (routine x 2)     Status: None (Preliminary result)   Collection Time: 01/27/22  8:50 AM   Specimen: BLOOD  Result Value Ref Range Status   Specimen Description BLOOD LEFT ANTECUBITAL  Final   Special Requests   Final    BOTTLES DRAWN AEROBIC AND ANAEROBIC Blood  Culture results may not be optimal due to an inadequate volume of blood received in culture bottles   Culture   Final    NO GROWTH < 24 HOURS Performed at [facility], [place] [address] [place], [place] [address]    Report Status PENDING  Incomplete  Blood culture (routine x 2)     Status: None (Preliminary result)   Collection Time: 01/27/22  8:59 AM   Specimen: BLOOD LEFT HAND  Result Value Ref Range Status   Specimen Description BLOOD LEFT HAND  Final   Special Requests   Final    BOTTLES DRAWN AEROBIC AND ANAEROBIC Blood Culture results may not  be optimal due to an inadequate volume of blood received in culture bottles   Culture   Final    NO GROWTH < 24 HOURS Performed at [place] [address] [place], [place] [address]    Report Status PENDING  Incomplete  SARS Coronavirus 2 by RT PCR (hospital order, performed in [facility] hospital lab) *cepheid single result test* Anterior Nasal Swab     Status: None   Collection Time: 01/27/22  9:43 AM   Specimen: Anterior Nasal Swab  Result Value Ref Range Status   SARS Coronavirus 2 by RT PCR NEGATIVE NEGATIVE Final    Comment: (NOTE) SARS-CoV-2 target nucleic acids are NOT DETECTED.  The SARS-CoV-2 RNA is generally detectable in upper and lower respiratory specimens during the acute phase of infection. The lowest concentration of SARS-CoV-2 viral copies this assay can detect is 250 copies / mL. A negative result does not preclude SARS-CoV-2 infection and should not be used as the sole basis for treatment or other patient management decisions.  A negative result may occur with improper specimen collection / handling, submission of specimen other than nasopharyngeal swab, presence of viral mutation(s) within the areas targeted by this assay, and inadequate number of viral copies (<250 copies / mL). A negative result must be combined with clinical observations, patient history, and epidemiological information.  Fact  Sheet for Patients:   https://www.patel.info/  Fact Sheet for Healthcare Providers: https://hall.com/  This test is not yet approved or  cleared by the [place] FDA and has been authorized for detection and/or diagnosis of SARS-CoV-2 by FDA under an Emergency Use Authorization (EUA).  This EUA will remain in effect (meaning this test can be used) for the duration of the COVID-19 declaration under Section 564(b)(1) of the Act, 21 U.S.C. section 360bbb-3(b)(1), unless the authorization is terminated or revoked sooner.  Performed at [facility], [place] [address], [place], [place] [address]      Medications:    amLODipine  5 mg Oral Daily   aspirin EC  81 mg Oral Daily   carvedilol  25 mg Oral BID WC   colchicine  0.6 mg Oral Daily   famotidine  40 mg Oral Daily   heparin  5,000 Units Subcutaneous Q12H   insulin aspart  0-15 Units Subcutaneous TID WC   pantoprazole  40 mg Oral Daily   pravastatin  80 mg Oral QHS   predniSONE  40 mg Oral BID WC   repaglinide  0.5 mg Oral BID AC   sodium bicarbonate  650 mg Oral BID   torsemide  100 mg Oral Daily   Continuous Infusions:    LOS: 1 day   Charlynne Cousins  [facility]  01/28/2022, 9:16 AM

## 2022-01-28 NOTE — Plan of Care (Signed)

## 2022-01-28 NOTE — TOC Progression Note (Signed)
Transition of Care [facility]) - Progression Note    Patient Details  Name: KYAL ARTS MRN: [id number] Date of Birth: 28-Feb-1941  [facility] CM/SW Contact  Emeterio Reeve,  Phone Number: 01/28/2022, 11:42 AM  Clinical Narrative:     CSW gave bed offers to pt, daughter [name] and son Damion at bedside. Pt and family was not pleased with current bed offers and would like to wait until Monday for other offers.  Pt also believes that once his feet are less swollen he may be able to return home without going to SNF. Daughter inquired about a personal aid for home, CSW explained that is a private pay and insurance usually does not cover. Pt and family expressed understanding. Daughter stated that pt was also getting HHPT from an agency but they cannot remember the name.   Expected Discharge Plan: [place] Barriers to Discharge: Ship broker, SNF Pending bed offer  Expected Discharge Plan and Services Expected Discharge Plan: [place] arrangements for the past 2 months: Single Family Home                                       Social Determinants of Health (SDOH) Interventions    Readmission Risk Interventions    05/17/2021   11:14 AM  Readmission Risk Prevention Plan  Transportation Screening Complete  PCP or Specialist Appt within 3-5 Days Complete  HRI or [place] Complete  Social Work Consult for [place] Planning/Counseling Complete  Palliative Care Screening Not Applicable  Medication Review Press photographer) Complete   Emeterio Reeve, [place] Social Worker

## 2022-01-29 DIAGNOSIS — M1009 Idiopathic gout, multiple sites: Secondary | ICD-10-CM

## 2022-01-29 DIAGNOSIS — I10 Essential (primary) hypertension: Secondary | ICD-10-CM | POA: Diagnosis not present

## 2022-01-29 LAB — GLUCOSE, CAPILLARY
Glucose-Capillary: 146 mg/dL — ABNORMAL HIGH (ref 70–99)
Glucose-Capillary: 190 mg/dL — ABNORMAL HIGH (ref 70–99)
Glucose-Capillary: 166 mg/dL — ABNORMAL HIGH (ref 70–99)
Glucose-Capillary: 157 mg/dL — ABNORMAL HIGH (ref 70–99)

## 2022-01-29 MED ORDER — PREDNISONE 20 MG PO TABS
40.0000 mg | ORAL_TABLET | Freq: Every day | ORAL | Status: DC
  Administered 2022-01-30 – 2022-01-31 (×2): 40 mg via ORAL
  Filled 2022-01-29 (×3): qty 2

## 2022-01-29 NOTE — Care Management CC44 (Signed)
Condition Code 44 Documentation Completed  Patient Details  Name: SAHID BORBA MRN: [id number] Date of Birth: 1940-09-14   Condition Code 44 given:  Yes Patient signature on Condition Code 44 notice:  Yes Documentation of 2 MD's agreement:  Yes Code 44 added to claim:  Yes    Joanne Chars, LCSW 01/29/2022, 12:41 PM

## 2022-01-29 NOTE — Progress Notes (Signed)
OT Cancellation Note  Patient Details Name: ANANT AGARD MRN: [id number] DOB: [birth year]   Cancelled Treatment:    Reason Eval/Treat Not Completed: Other (comment). Pt worked with mobility tech earlier and just finished with PT and is back in bed. Will re-attempt eval tomorrow.  [place], OTR/L [facility] [phone] Office [phone]    Almon Register 01/29/2022, 2:42 PM

## 2022-01-29 NOTE — TOC Progression Note (Addendum)
Transition of Care [facility]) - Progression Note    Patient Details  Name: Harold Pittman MRN: [id number] Date of Birth: Jan 28, 1941  Transition of Care [facility]) CM/SW Contact  Joanne Chars, LCSW Phone Number: 01/29/2022, 9:54 AM  Clinical Narrative:   CSW spoke with pt about bed offers.  Pt states he is feeling better and thinks he could go home, rather than SNF.  Pt lives alone, but reports his daughter and son are both available to assist at home.  CSW spoke with PT and they will attempt to see pt again to gauge progress.    55: CSW spoke with pt, daughter, and one of his sons.  Daughter reports that all of the children work and are not able to provide significant help if pt goes home.  Discussed with pt and pt eventually agrees to SNF--daughter is in [place], asking for referral to be sent to SNF options there, which was done.     Expected Discharge Plan: Skilled Nursing Facility Barriers to Discharge: Ship broker, SNF Pending bed offer  Expected Discharge Plan and Services Expected Discharge Plan: [place] arrangements for the past 2 months: Single Family Home                                       Social Determinants of Health (SDOH) Interventions    Readmission Risk Interventions    05/17/2021   11:14 AM  Readmission Risk Prevention Plan  Transportation Screening Complete  PCP or Specialist Appt within 3-5 Days Complete  HRI or [place] Complete  Social Work Consult for [place] Planning/Counseling Complete  Palliative Care Screening Not Applicable  Medication Review Press photographer) Complete

## 2022-01-29 NOTE — Progress Notes (Signed)
Mobility Specialist Progress Note   01/29/22 1232  Mobility  Activity Stood at bedside;Transferred to/from [facility]  Level of Assistance Moderate assist, patient does 50-74%  Assistive Device [place];Front wheel walker  Distance Ambulated (ft) 2 ft  Activity Response Tolerated well  $Mobility charge 1 Mobility   Pre Mobility: 875 HR, 153/82 BP, 99% SpO2 During Mobility: 101 HR, 89%SpO2 Post Mobility: 64 HR, 123/62 BP, 100% SpO2  Received pt in bed having slight R toe pain but otherwise agreeable to mobility. MinA to get EOB + minA to stand from an elevated surface to stedy, pt slightly unstable once fully standing. Afterwards attempted standing trial w/ RW and pt able to stand w/ mod A but also having BM. Transferred to [facility] w/ a stand, pivot, turn but pt prematurely needing to sit to finish remainder of BM. Once done, assisted w/ pericare in stedy and transferred to chair w/o fault. Call bell in reach and chair alarm on.   Holland Falling Mobility Specialist Acute Rehab Office:  [phone]

## 2022-01-29 NOTE — Progress Notes (Signed)
Physical Therapy Treatment Patient Details Name: Harold Pittman MRN: [id number] DOB: [birth year] Today's Date: 01/29/2022   History of Present Illness 81 y.o. male presents to [facility] ED on 01/25/2022 with chest pain. Workup negative for acute findings. Pt also with general weakness and inability to ambulate. PMHx significant for COPD, CAD s/p CAGB, HTN, DMII, CKD IV, anemia and diastolic CHF.    PT Comments    Pt received sitting up in chair and requesting to get back to bed. Pt requiring heavy mod-maximal assist for transfers and is unable to ambulate. Demonstrates impaired standing balance, generalized weakness, and decreased activity tolerance. Continue to recommend SNF for ongoing Physical Therapy.      Recommendations for follow up therapy are one component of a multi-disciplinary discharge planning process, led by the attending physician.  Recommendations may be updated based on patient status, additional functional criteria and insurance authorization.  Follow Up Recommendations  Skilled nursing-short term rehab (<3 hours/day) Can patient physically be transported by private vehicle: No   Assistance Recommended at Discharge Intermittent Supervision/Assistance  Patient can return home with the following Two people to help with walking and/or transfers;Two people to help with bathing/dressing/bathroom;Assistance with cooking/housework;Assist for transportation;Help with stairs or ramp for entrance   Equipment Recommendations  Other (comment) (defer)    Recommendations for Other Services       Precautions / Restrictions Precautions Precautions: Fall Restrictions Weight Bearing Restrictions: No     Mobility  Bed Mobility Overal bed mobility: Needs Assistance Bed Mobility: Sit to Supine       Sit to supine: Mod assist   General bed mobility comments: Assist for LE's back into bed    Transfers Overall transfer level: Needs assistance Equipment used: Rolling walker (2  wheels) Transfers: Sit to/from Stand, Bed to chair/wheelchair/BSC Sit to Stand: Mod assist, Max assist Stand pivot transfers: Mod assist         General transfer comment: MaxA to stand from low surface of recliner, cues for hand placement, wider BOS, and bringing feet posteriorly. Pivoting towards left to bed and sitting prematurely. ModA to stand from elevated bed height and take steps up towards head of bed.    Ambulation/Gait                   Stairs             Wheelchair Mobility    Modified Rankin (Stroke Patients Only)       Balance Overall balance assessment: Needs assistance Sitting-balance support: Feet supported Sitting balance-Leahy Scale: Fair     Standing balance support: Bilateral upper extremity supported Standing balance-Leahy Scale: Poor Standing balance comment: reliant on RW                            Cognition Arousal/Alertness: Awake/alert Behavior During Therapy: WFL for tasks assessed/performed Overall Cognitive Status: Impaired/Different from baseline Area of Impairment: Awareness, Problem solving, Following commands                       Following Commands: Follows one step commands with increased time   Awareness: Emergent Problem Solving: Requires verbal cues General Comments: may be at baseline, slowed processing. emerging awareness that he cannot go home in current physical state        Exercises      General Comments        Pertinent Vitals/Pain Pain Assessment Pain Assessment: Faces Faces Pain Scale:  No hurt    Home Living                          Prior Function            PT Goals (current goals can now be found in the care plan section) Acute Rehab PT Goals Patient Stated Goal: to improve strength and return to walking Potential to Achieve Goals: Fair    Frequency    Min 2X/week      PT Plan Current plan remains appropriate    Co-evaluation               AM-PAC PT "6 Clicks" Mobility   Outcome Measure  Help needed turning from your back to your side while in a flat bed without using bedrails?: A Little Help needed moving from lying on your back to sitting on the side of a flat bed without using bedrails?: A Lot Help needed moving to and from a bed to a chair (including a wheelchair)?: A Lot Help needed standing up from a chair using your arms (e.g., wheelchair or bedside chair)?: A Lot Help needed to walk in hospital room?: Total Help needed climbing 3-5 steps with a railing? : Total 6 Click Score: 11    End of Session Equipment Utilized During Treatment: Gait belt Activity Tolerance: Patient tolerated treatment well Patient left: in bed;with bed alarm set;with call bell/phone within reach Nurse Communication: Mobility status PT Visit Diagnosis: Other abnormalities of gait and mobility (R26.89);Muscle weakness (generalized) (M62.81)     Time: 1413-1430 PT Time Calculation (min) (ACUTE ONLY): 17 min  Charges:  $Therapeutic Activity: 8-22 mins                     [name], PT, DPT Acute Rehabilitation Services Office [phone]    [name] 01/29/2022, 3:17 PM

## 2022-01-29 NOTE — Care Management Obs Status (Signed)
[place] NOTIFICATION   Patient Details  Name: Harold Pittman MRN: [id number] Date of Birth: Aug 17, 1940   Medicare Observation Status Notification Given:  Yes    Joanne Chars, LCSW 01/29/2022, 12:41 PM

## 2022-01-29 NOTE — Plan of Care (Signed)

## 2022-01-29 NOTE — Telephone Encounter (Signed)
Noted! Thank you

## 2022-01-29 NOTE — Progress Notes (Signed)
[facility] PROGRESS NOTE    Progress Note  Harold Pittman  KYH:[id number] DOB: 1940-07-14 DOA: 01/25/2022 PCP: Cassandria Anger, MD     Brief Narrative:   Harold Pittman is an 81 y.o. 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   Assessment/Plan:   Polyarticular gout flare: Continues for 48 hours in the setting of chronic kidney disease stage IV, agree with steroids. A candidate for allopurinol due to his renal dysfunction. Imaging showed erosion of the distal tubular with swelling and edema of the right ankle. Able to move his legs now in the bed. We will consult physical therapy. He relates his pain is significantly better compared to yesterday.  SIRS likely due to gout flare: See above for the details he is not septic. His leukocytosis will persist due to steroids.  Hypokalemia:  Now proved try to keep greater than 4.  Hyponatremia mild: Asymptomatic.  Now resolved after fluid resuscitation.  Essential hypertension: Stable continue current medication.  Chronic kidney disease stage IV: Noted at baseline.  Insulin-dependent diabetes mellitus type 2: Blood glucose will become erratic with the steroids, will long-acting swelling and increase his sliding scale.  Chronic diastolic heart failure: Appears to be euvolemic blood pressure is controlled.   DVT prophylaxis: lovenox Family Communication:none Status is: Inpatient Remains inpatient appropriate because: poli articular gout flare    Code Status:     Code Status Orders   (From admission, onward)           Start     Ordered   01/27/22 1817  Full code  Continuous        01/27/22 1817           Code Status History     Date Active Date Inactive Code Status Order ID Comments User Context   05/16/2021 0711 05/18/2021 1839 Full Code [id number]  Shela Leff, MD ED   01/10/2019 0730 01/12/2019 1502 Full Code [id number]  Norval Morton, MD ED   09/24/2017 2323 09/26/2017 1906 Full Code [id number]  Vianne Bulls, MD ED   07/22/2015 1714 07/26/2015 1635 Full Code [id number]  Rondel Jumbo, PA-C ED         IV Access:   Peripheral IV   Procedures and diagnostic studies:   DG Ankle 2 Views Right  Result Date: 01/27/2022 CLINICAL DATA:  Right ankle pain. EXAM: RIGHT ANKLE - 2 VIEW COMPARISON:  None Available. FINDINGS: There is no evidence of fracture, dislocation, or joint effusion. No significant joint space narrowing is noted. However, there does appear to be erosion involving the distal fibula with overlying soft tissue swelling or edema which may represent gout. IMPRESSION: Possible erosion involving distal fibula with overlying soft tissue swelling or edema suggesting possible gout. No fracture or dislocation is noted. Electronically Signed   By: Marijo Conception M.D.   On: 01/27/2022 19:40   DG Ankle 2 Views Left  Result Date: 01/27/2022 CLINICAL DATA:  Left ankle pain. EXAM: LEFT ANKLE - 2 VIEW COMPARISON:  None Available. FINDINGS: There is no evidence of fracture, dislocation, or joint effusion.  There is no evidence of arthropathy or other focal bone abnormality. Soft tissues are unremarkable. IMPRESSION: Negative. Electronically Signed   By: Marijo Conception M.D.   On: 01/27/2022 19:38   DG Knee 1-2 Views Right  Result Date: 01/27/2022 CLINICAL DATA:  Right knee pain. EXAM: RIGHT KNEE - 1-2 VIEW COMPARISON:  None Available. FINDINGS: No evidence of fracture or dislocation. Small suprapatellar joint effusion is noted. Moderate narrowing of  medial joint space is noted. Soft tissues are unremarkable. 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 Small suprapatellar joint effusion. No fracture or dislocation. Electronically Signed   By: Marijo Conception M.D.   On: 01/27/2022 19:37   DG Knee 1-2 Views Left  Result Date: 01/27/2022 CLINICAL DATA:  Left knee pain. EXAM: LEFT KNEE - 1-2 VIEW COMPARISON:  None Available. FINDINGS: No evidence of fracture, dislocation, or joint effusion. Mild narrowing of medial joint space is noted. Soft tissues are unremarkable. IMPRESSION: Mild degenerative joint disease is noted medially. No acute abnormality seen. Electronically Signed   By: Marijo Conception M.D.   On: 01/27/2022 19:36   CT Abdomen Pelvis Wo Contrast  Result Date: 01/27/2022 CLINICAL DATA:  81 year old male with acute abdominal and pelvic pain and fever. EXAM: CT ABDOMEN AND PELVIS WITHOUT CONTRAST TECHNIQUE: Multidetector CT imaging of the abdomen and pelvis was performed following the standard protocol without IV contrast. RADIATION DOSE REDUCTION: This exam was performed according to the departmental dose-optimization program which includes automated exposure control, adjustment of the mA and/or kV according to patient size and/or use of iterative reconstruction technique. COMPARISON:  10/30/2005 CT.  03/31/2021 MR FINDINGS: Please note that parenchymal and vascular abnormalities may be missed as intravenous contrast was not administered. Lower chest: No acute abnormality. Hepatobiliary: Cholelithiasis identified without CT evidence of acute cholecystitis. Hepatic cysts are again identified without significant hepatic abnormalities. There is no evidence of intrahepatic or extrahepatic biliary dilatation. Pancreas: Unremarkable Spleen: Unremarkable Adrenals/Urinary Tract: Multiple bilateral renal cysts are again identified and no follow-up imaging recommended. There is no evidence of hydronephrosis or urinary calculi. The  adrenal glands and bladder are unremarkable. Stomach/Bowel: Stomach is within normal limits. Appendix appears normal. No evidence of bowel wall thickening, distention, or inflammatory changes. Vascular/Lymphatic: Aortic atherosclerosis. No enlarged abdominal or pelvic lymph nodes. Reproductive: Prostate enlargement again noted. Other: No ascites, focal collection or pneumoperitoneum. Musculoskeletal: No acute or suspicious bony abnormalities are noted. L5-S1 degenerative disc disease/spondylosis again noted. IMPRESSION: 1. No evidence of acute abnormality. 2. Cholelithiasis without CT evidence of acute cholecystitis. 3. Prostate enlargement. 4.  Aortic Atherosclerosis (ICD10-I70.0). Electronically Signed   By: Margarette Canada M.D.   On: 01/27/2022 13:17     Medical Consultants:   None.   Subjective:    Harold Pittman relates his pain is significantly improved he will believes he could bear weight this morning.  Objective:    Vitals:   01/28/22 0821 01/28/22 1659 01/28/22 2024 01/29/22 0425  BP: 121/72 102/75 121/64 (!) 142/71  Pulse: 69 69 66 65  Resp: (!) 21 (!) 25 (!) 23 18  Temp: 98.2 F (36.8 C) 98.3 F (36.8 C) 98 F (36.7 C)   TempSrc: Oral Oral Oral   SpO2: 98% 98% 98% 97%  Weight:      Height:       SpO2: 97 %   Intake/Output Summary (Last 24 hours) at 01/29/2022 0821 Last data filed at 01/28/2022 1653 Gross per 24 hour  Intake --  Output 900  ml  Net -900 ml    Filed Weights   01/25/22 1510 01/28/22 0513  Weight: 79.4 kg 79.4 kg    Exam: General exam: In no acute distress. Respiratory system: Good air movement and clear to auscultation. Cardiovascular system: S1 & S2 heard, RRR. No JVD. Gastrointestinal system: Abdomen is nondistended, soft and nontender.  Extremities: No pedal edema. Skin: No rashes, lesions or ulcers Psychiatry: Judgement and insight appear normal. Mood & affect appropriate.   Data Reviewed:    Labs: Basic Metabolic Panel: Recent  Labs  Lab 01/25/22 1527 01/27/22 0859 01/28/22 0300  NA 141 134* 137  K 3.7 3.2* 3.5  CL 100 98 98  CO2 25 16* 25  GLUCOSE 111* 169* 192*  BUN 33* 36* 44*  CREATININE 2.56* 2.43* 2.80*  CALCIUM 10.0 8.5* 8.8*    GFR Estimated Creatinine Clearance: 23.1 mL/min (A) (by C-G formula based on SCr of 2.8 mg/dL (H)). Liver Function Tests: Recent Labs  Lab 01/25/22 1527 01/27/22 0859  AST 20 21  ALT 13 13  ALKPHOS 84 72  BILITOT 1.1 1.3*  PROT 7.4 6.8  ALBUMIN 4.1 3.4*    No results for input(s): "LIPASE", "AMYLASE" in the last 168 hours. No results for input(s): "AMMONIA" in the last 168 hours. Coagulation profile No results for input(s): "INR", "PROTIME" in the last 168 hours. COVID-19 Labs  No results for input(s): "DDIMER", "FERRITIN", "LDH", "CRP" in the last 72 hours.  Lab Results  Component Value Date   SARSCOV2NAA NEGATIVE 01/27/2022   SARSCOV2NAA NEGATIVE 01/26/2022   SARSCOV2NAA NEGATIVE 05/15/2021   [place] NEGATIVE 01/10/2019    CBC: Recent Labs  Lab 01/25/22 1527 01/27/22 0859 01/27/22 2116 01/28/22 0300  WBC 9.0 16.9*  --  16.5*  NEUTROABS  --   --  15.0*  --   HGB 11.3* 10.5*  --  9.3*  HCT 32.9* 30.9*  --  27.1*  MCV 84.6 84.2  --  83.6  PLT 286 278  --  226    Cardiac Enzymes: No results for input(s): "CKTOTAL", "CKMB", "CKMBINDEX", "TROPONINI" in the last 168 hours. BNP (last 3 results) No results for input(s): "PROBNP" in the last 8760 hours. CBG: Recent Labs  Lab 01/27/22 2106 01/28/22 0812 01/28/22 1138 01/28/22 1650 01/28/22 2242  GLUCAP 210* 156* 203* 176* 171*    D-Dimer: No results for input(s): "DDIMER" in the last 72 hours. Hgb A1c: Recent Labs    01/28/22 0300  HGBA1C 5.5   Lipid Profile: No results for input(s): "CHOL", "HDL", "LDLCALC", "TRIG", "CHOLHDL", "LDLDIRECT" in the last 72 hours. Thyroid function studies: No results for input(s): "TSH", "T4TOTAL", "T3FREE", "THYROIDAB" in the last 72  hours.  Invalid input(s): "FREET3" Anemia work up: No results for input(s): "VITAMINB12", "FOLATE", "FERRITIN", "TIBC", "IRON", "RETICCTPCT" in the last 72 hours. Sepsis Labs: Recent Labs  Lab 01/25/22 1527 01/27/22 0859 01/28/22 0300  WBC 9.0 16.9* 16.5*  LATICACIDVEN  --  1.3  --     Microbiology Recent Results (from the past 240 hour(s))  Resp Panel by RT-PCR (Flu A&B, Covid) Anterior Nasal Swab     Status: None   Collection Time: 01/26/22  3:42 PM   Specimen: Anterior Nasal Swab  Result Value Ref Range Status   SARS Coronavirus 2 by RT PCR NEGATIVE NEGATIVE Final    Comment: (NOTE) SARS-CoV-2 target nucleic acids are NOT DETECTED.  The SARS-CoV-2 RNA is generally detectable in upper respiratory specimens during the acute phase of infection. The lowest concentration of  SARS-CoV-2 viral copies this assay can detect is 138 copies/mL. A negative result does not preclude SARS-Cov-2 infection and should not be used as the sole basis for treatment or other patient management decisions. A negative result may occur with  improper specimen collection/handling, submission of specimen other than nasopharyngeal swab, presence of viral mutation(s) within the areas targeted by this assay, and inadequate number of viral copies(<138 copies/mL). A negative result must be combined with clinical observations, patient history, and epidemiological information. The expected result is Negative.  Fact Sheet for Patients:  EntrepreneurPulse.com.au  Fact Sheet for Healthcare Providers:  IncredibleEmployment.be  This test is no t yet approved or cleared by the [place] FDA and  has been authorized for detection and/or diagnosis of SARS-CoV-2 by FDA under an Emergency Use Authorization (EUA). This EUA will remain  in effect (meaning this test can be used) for the duration of the COVID-19 declaration under Section 564(b)(1) of the Act, 21 U.S.C.section  360bbb-3(b)(1), unless the authorization is terminated  or revoked sooner.       Influenza A by PCR NEGATIVE NEGATIVE Final   Influenza B by PCR NEGATIVE NEGATIVE Final    Comment: (NOTE) The Xpert Xpress SARS-CoV-2/FLU/RSV plus assay is intended as an aid in the diagnosis of influenza from Nasopharyngeal swab specimens and should not be used as a sole basis for treatment. Nasal washings and aspirates are unacceptable for Xpert Xpress SARS-CoV-2/FLU/RSV testing.  Fact Sheet for Patients: EntrepreneurPulse.com.au  Fact Sheet for Healthcare Providers: IncredibleEmployment.be  This test is not yet approved or cleared by the [place] FDA and has been authorized for detection and/or diagnosis of SARS-CoV-2 by FDA under an Emergency Use Authorization (EUA). This EUA will remain in effect (meaning this test can be used) for the duration of the COVID-19 declaration under Section 564(b)(1) of the Act, 21 U.S.C. section 360bbb-3(b)(1), unless the authorization is terminated or revoked.  Performed at [facility], [place] [address], [place], [place] [address]   Blood culture (routine x 2)     Status: None (Preliminary result)   Collection Time: 01/27/22  8:50 AM   Specimen: BLOOD  Result Value Ref Range Status   Specimen Description BLOOD LEFT ANTECUBITAL  Final   Special Requests   Final    BOTTLES DRAWN AEROBIC AND ANAEROBIC Blood Culture results may not be optimal due to an inadequate volume of blood received in culture bottles   Culture   Final    NO GROWTH < 24 HOURS Performed at [facility], [place] [address], [place], [place] [address]    Report Status PENDING  Incomplete  Blood culture (routine x 2)     Status: None (Preliminary result)   Collection Time: 01/27/22  8:59 AM   Specimen: BLOOD LEFT HAND  Result Value Ref Range Status   Specimen Description BLOOD LEFT HAND  Final   Special Requests   Final    BOTTLES DRAWN  AEROBIC AND ANAEROBIC Blood Culture results may not be optimal due to an inadequate volume of blood received in culture bottles   Culture   Final    NO GROWTH < 24 HOURS Performed at [facility], [place] [address], [place], [place] [address]    Report Status PENDING  Incomplete  SARS Coronavirus 2 by RT PCR (hospital order, performed in [facility] hospital lab) *cepheid single result test* Anterior Nasal Swab     Status: None   Collection Time: 01/27/22  9:43 AM  Specimen: Anterior Nasal Swab  Result Value Ref Range Status   SARS Coronavirus 2 by RT PCR NEGATIVE NEGATIVE Final    Comment: (NOTE) SARS-CoV-2 target nucleic acids are NOT DETECTED.  The SARS-CoV-2 RNA is generally detectable in upper and lower respiratory specimens during the acute phase of infection. The lowest concentration of SARS-CoV-2 viral copies this assay can detect is 250 copies / mL. A negative result does not preclude SARS-CoV-2 infection and should not be used as the sole basis for treatment or other patient management decisions.  A negative result may occur with improper specimen collection / handling, submission of specimen other than nasopharyngeal swab, presence of viral mutation(s) within the areas targeted by this assay, and inadequate number of viral copies (<250 copies / mL). A negative result must be combined with clinical observations, patient history, and epidemiological information.  Fact Sheet for Patients:   https://www.patel.info/  Fact Sheet for Healthcare Providers: https://hall.com/  This test is not yet approved or  cleared by the [place] FDA and has been authorized for detection and/or diagnosis of SARS-CoV-2 by FDA under an Emergency Use Authorization (EUA).  This EUA will remain in effect (meaning this test can be used) for the duration of the COVID-19 declaration under Section 564(b)(1) of the Act, 21 U.S.C. section  360bbb-3(b)(1), unless the authorization is terminated or revoked sooner.  Performed at [facility], [place] [address], [place], [place] [address]   Urine Culture     Status: Abnormal   Collection Time: 01/27/22  4:33 PM   Specimen: Urine, Clean Catch  Result Value Ref Range Status   Specimen Description URINE, CLEAN CATCH  Final   Special Requests NONE  Final   Culture (A)  Final    <10,000 COLONIES/mL INSIGNIFICANT GROWTH Performed at [facility], [place] [address] [place], [place] [address]    Report Status 01/28/2022 FINAL  Final     Medications:    amLODipine  5 mg Oral Daily   aspirin EC  81 mg Oral Daily   carvedilol  25 mg Oral BID WC   colchicine  0.6 mg Oral Daily   famotidine  40 mg Oral Daily   heparin  5,000 Units Subcutaneous Q12H   insulin aspart  0-5 Units Subcutaneous QHS   insulin aspart  0-9 Units Subcutaneous TID WC   insulin aspart  2 Units Subcutaneous TID WC   insulin detemir  5 Units Subcutaneous BID   pantoprazole  40 mg Oral Daily   polyethylene glycol  17 g Oral BID   pravastatin  80 mg Oral QHS   predniSONE  40 mg Oral BID WC   repaglinide  0.5 mg Oral Q supper   sodium bicarbonate  650 mg Oral BID   torsemide  100 mg Oral Daily   Continuous Infusions:    LOS: 1 day   Charlynne Cousins  [facility]  01/29/2022, 8:21 AM

## 2022-01-30 ENCOUNTER — Encounter (HOSPITAL_COMMUNITY): Payer: Self-pay | Admitting: Internal Medicine

## 2022-01-30 DIAGNOSIS — M1009 Idiopathic gout, multiple sites: Secondary | ICD-10-CM | POA: Diagnosis not present

## 2022-01-30 DIAGNOSIS — I10 Essential (primary) hypertension: Secondary | ICD-10-CM | POA: Diagnosis not present

## 2022-01-30 LAB — GLUCOSE, CAPILLARY
Glucose-Capillary: 122 mg/dL — ABNORMAL HIGH (ref 70–99)
Glucose-Capillary: 166 mg/dL — ABNORMAL HIGH (ref 70–99)
Glucose-Capillary: 221 mg/dL — ABNORMAL HIGH (ref 70–99)
Glucose-Capillary: 218 mg/dL — ABNORMAL HIGH (ref 70–99)

## 2022-01-30 NOTE — TOC Progression Note (Addendum)
Transition of Care [facility]) - Progression Note    Patient Details  Name: Harold Pittman MRN: [id number] Date of Birth: [birth year]  [facility] CM/SW Contact  Joanne Chars, LCSW Phone Number: 01/30/2022, 11:23 AM  Clinical Narrative:   Bed offers provided to pt along with choice document, he asked CSW to call daughter [name].  CSW spoke to [place] by phone and she would like to accept offer at [facility].    CSW confirmed with [facility] that they can take pt.    Auth request submitted in [place].  1330: auth approved in [place]: [id number], 3 days: 10/24-10/26.  MD informed.  Expected Discharge Plan: Skilled Nursing Facility Barriers to Discharge: Ship broker, SNF Pending bed offer  Expected Discharge Plan and Services Expected Discharge Plan: [place] arrangements for the past 2 months: Single Family Home                                       Social Determinants of Health (SDOH) Interventions    Readmission Risk Interventions    05/17/2021   11:14 AM  Readmission Risk Prevention Plan  Transportation Screening Complete  PCP or Specialist Appt within 3-5 Days Complete  HRI or [place] Complete  Social Work Consult for [place] Planning/Counseling Complete  Palliative Care Screening Not Applicable  Medication Review Press photographer) Complete

## 2022-01-30 NOTE — Plan of Care (Signed)
  Problem: Education: Goal: Ability to describe self-care measures that may prevent or decrease complications (Diabetes Survival Skills Education) will improve Outcome: Progressing   Problem: Fluid Volume: Goal: Ability to maintain a balanced intake and output will improve Outcome: Progressing   Problem: Health Behavior/Discharge Planning: Goal: Ability to identify and utilize available resources and services will improve Outcome: Progressing Goal: Ability to manage health-related needs will improve Outcome: Progressing   Problem: Metabolic: Goal: Ability to maintain appropriate glucose levels will improve Outcome: Progressing   Problem: Nutritional: Goal: Maintenance of adequate nutrition will improve Outcome: Progressing   Problem: Tissue Perfusion: Goal: Adequacy of tissue perfusion will improve Outcome: Progressing   Problem: Education: Goal: Knowledge of General Education information will improve Description: Including pain rating scale, medication(s)/side effects and non-pharmacologic comfort measures Outcome: Progressing   Problem: Activity: Goal: Risk for activity intolerance will decrease Outcome: Progressing   Problem: Nutrition: Goal: Adequate nutrition will be maintained Outcome: Progressing

## 2022-01-30 NOTE — Progress Notes (Signed)
[facility] PROGRESS NOTE    Progress Note  Harold Pittman  GNF:[id number] DOB: [birth year] DOA: 01/25/2022 PCP: Cassandria Anger, MD     Brief Narrative:   Harold Pittman is an 81 y.o. 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   Assessment/Plan:   Polyarticular gout flare: Currently on colchicine and steroids he relates his pain is improved. Not a candidate for allopurinol due to his renal dysfunction. Imaging showed erosion of the distal tubular with swelling and edema of the right ankle. Able to move his legs now in the bed. Physical therapy evaluated the patient recommended short-term rehab.  SIRS likely due to gout flare: See above for the details he is not septic. His leukocytosis will persist due to steroids.  Hypokalemia:  Now improved, try to keep greater than 4.  Hyponatremia mild: Asymptomatic.  Now resolved after fluid resuscitation.  Essential hypertension: Stable continue current medication.  Chronic kidney disease stage IV: Noted at baseline.  Insulin-dependent diabetes mellitus type 2: Blood glucose will become erratic with the steroids, will long-acting swelling and increase his sliding scale.  Chronic diastolic heart failure: Appears to be euvolemic blood pressure is controlled.   DVT prophylaxis: lovenox Family Communication:none Status is: Inpatient Remains inpatient appropriate because: poli articular gout flare    Code Status:     Code Status Orders  (From admission, onward)           Start      Ordered   01/27/22 1817  Full code  Continuous        01/27/22 1817           Code Status History     Date Active Date Inactive Code Status Order ID Comments User Context   05/16/2021 0711 05/18/2021 1839 Full Code [id number]  Shela Leff, MD ED   01/10/2019 0730 01/12/2019 1502 Full Code [id number]  Norval Morton, MD ED   09/24/2017 2323 09/26/2017 1906 Full Code [id number]  Vianne Bulls, MD ED   07/22/2015 1714 07/26/2015 1635 Full Code [id number]  Rondel Jumbo, PA-C ED         IV Access:   Peripheral IV   Procedures and diagnostic studies:   No results found.   Medical Consultants:   None.   Subjective:    Harold Pittman relates his pain is improved.  Objective:    Vitals:   01/29/22 1645 01/29/22 2053 01/30/22 0432 01/30/22 0911  BP: 131/70 125/66 (!) 155/91 137/79  Pulse:  (!) 59 65 63  Resp: '19 18 18 16  '$ Temp: 97.9 F (36.6 C) 98 F (36.7 C)  97.9 F (36.6 C)  TempSrc: Oral Oral    SpO2: 95% 100% 99% 100%  Weight:      Height:       SpO2: 100 %   Intake/Output Summary (Last 24 hours) at 01/30/2022 1005 Last data filed at 01/30/2022 0209 Gross per 24 hour  Intake 120 ml  Output 2000 ml  Net -1880 ml    Filed Weights   01/25/22 1510 01/28/22 0513  Weight: 79.4 kg  79.4 kg    Exam: General exam: In no acute distress. Respiratory system: Good air movement and clear to auscultation. Cardiovascular system: S1 & S2 heard, RRR. No JVD. Gastrointestinal system: Abdomen is nondistended, soft and nontender.  Extremities: No pedal edema. Skin: No rashes, lesions or ulcers Psychiatry: Judgement and insight appear normal. Mood & affect appropriate.  Data Reviewed:    Labs: Basic Metabolic Panel: Recent Labs  Lab 01/25/22 1527 01/27/22 0859 01/28/22 0300  NA 141 134* 137  K 3.7 3.2* 3.5  CL 100 98 98  CO2 25 16* 25  GLUCOSE 111* 169* 192*  BUN 33* 36* 44*  CREATININE 2.56* 2.43* 2.80*  CALCIUM 10.0 8.5* 8.8*     GFR Estimated Creatinine Clearance: 23.1 mL/min (A) (by C-G formula based on SCr of 2.8 mg/dL (H)). Liver Function Tests: Recent Labs  Lab 01/25/22 1527 01/27/22 0859  AST 20 21  ALT 13 13  ALKPHOS 84 72  BILITOT 1.1 1.3*  PROT 7.4 6.8  ALBUMIN 4.1 3.4*    No results for input(s): "LIPASE", "AMYLASE" in the last 168 hours. No results for input(s): "AMMONIA" in the last 168 hours. Coagulation profile No results for input(s): "INR", "PROTIME" in the last 168 hours. COVID-19 Labs  No results for input(s): "DDIMER", "FERRITIN", "LDH", "CRP" in the last 72 hours.  Lab Results  Component Value Date   SARSCOV2NAA NEGATIVE 01/27/2022   SARSCOV2NAA NEGATIVE 01/26/2022   SARSCOV2NAA NEGATIVE 05/15/2021   [place] NEGATIVE 01/10/2019    CBC: Recent Labs  Lab 01/25/22 1527 01/27/22 0859 01/27/22 2116 01/28/22 0300  WBC 9.0 16.9*  --  16.5*  NEUTROABS  --   --  15.0*  --   HGB 11.3* 10.5*  --  9.3*  HCT 32.9* 30.9*  --  27.1*  MCV 84.6 84.2  --  83.6  PLT 286 278  --  226    Cardiac Enzymes: No results for input(s): "CKTOTAL", "CKMB", "CKMBINDEX", "TROPONINI" in the last 168 hours. BNP (last 3 results) No results for input(s): "PROBNP" in the last 8760 hours. CBG: Recent Labs  Lab 01/29/22 0824 01/29/22 1217 01/29/22 1655 01/29/22 2231 01/30/22 0730  GLUCAP 146* 190* 166* 157* 122*    D-Dimer: No results for input(s): "DDIMER" in the last 72 hours. Hgb A1c: Recent Labs    01/28/22 0300  HGBA1C 5.5    Lipid Profile: No results for input(s): "CHOL", "HDL", "LDLCALC", "TRIG", "CHOLHDL", "LDLDIRECT" in the last 72 hours. Thyroid function studies: No results for input(s): "TSH", "T4TOTAL", "T3FREE", "THYROIDAB" in the last 72 hours.  Invalid input(s): "FREET3" Anemia work up: No results for input(s): "VITAMINB12", "FOLATE", "FERRITIN", "TIBC", "IRON", "RETICCTPCT" in the last 72 hours. Sepsis Labs: Recent Labs  Lab 01/25/22 1527 01/27/22 0859  01/28/22 0300  WBC 9.0 16.9* 16.5*  LATICACIDVEN  --  1.3  --     Microbiology Recent Results (from the past 240 hour(s))  Resp Panel by RT-PCR (Flu A&B, Covid) Anterior Nasal Swab     Status: None   Collection Time: 01/26/22  3:42 PM   Specimen: Anterior Nasal Swab  Result Value Ref Range Status   SARS Coronavirus 2 by RT PCR NEGATIVE NEGATIVE Final    Comment: (NOTE) SARS-CoV-2 target nucleic acids are NOT DETECTED.  The SARS-CoV-2 RNA is generally detectable in upper respiratory specimens during the acute phase of infection. The lowest concentration of SARS-CoV-2 viral copies this assay can detect is 138 copies/mL. A negative result does not preclude SARS-Cov-2 infection and should  not be used as the sole basis for treatment or other patient management decisions. A negative result may occur with  improper specimen collection/handling, submission of specimen other than nasopharyngeal swab, presence of viral mutation(s) within the areas targeted by this assay, and inadequate number of viral copies(<138 copies/mL). A negative result must be combined with clinical observations, patient history, and epidemiological information. The expected result is Negative.  Fact Sheet for Patients:  EntrepreneurPulse.com.au  Fact Sheet for Healthcare Providers:  IncredibleEmployment.be  This test is no t yet approved or cleared by the [place] FDA and  has been authorized for detection and/or diagnosis of SARS-CoV-2 by FDA under an Emergency Use Authorization (EUA). This EUA will remain  in effect (meaning this test can be used) for the duration of the COVID-19 declaration under Section 564(b)(1) of the Act, 21 U.S.C.section 360bbb-3(b)(1), unless the authorization is terminated  or revoked sooner.       Influenza A by PCR NEGATIVE NEGATIVE Final   Influenza B by PCR NEGATIVE NEGATIVE Final    Comment: (NOTE) The Xpert Xpress SARS-CoV-2/FLU/RSV  plus assay is intended as an aid in the diagnosis of influenza from Nasopharyngeal swab specimens and should not be used as a sole basis for treatment. Nasal washings and aspirates are unacceptable for Xpert Xpress SARS-CoV-2/FLU/RSV testing.  Fact Sheet for Patients: EntrepreneurPulse.com.au  Fact Sheet for Healthcare Providers: IncredibleEmployment.be  This test is not yet approved or cleared by the [place] FDA and has been authorized for detection and/or diagnosis of SARS-CoV-2 by FDA under an Emergency Use Authorization (EUA). This EUA will remain in effect (meaning this test can be used) for the duration of the COVID-19 declaration under Section 564(b)(1) of the Act, 21 U.S.C. section 360bbb-3(b)(1), unless the authorization is terminated or revoked.  Performed at [facility], [place] [address], [place], [place] [address]   Blood culture (routine x 2)     Status: None (Preliminary result)   Collection Time: 01/27/22  8:50 AM   Specimen: BLOOD  Result Value Ref Range Status   Specimen Description BLOOD LEFT ANTECUBITAL  Final   Special Requests   Final    BOTTLES DRAWN AEROBIC AND ANAEROBIC Blood Culture results may not be optimal due to an inadequate volume of blood received in culture bottles   Culture   Final    NO GROWTH 3 DAYS Performed at [facility], [place] [address], [place], [place] [address]    Report Status PENDING  Incomplete  Blood culture (routine x 2)     Status: None (Preliminary result)   Collection Time: 01/27/22  8:59 AM   Specimen: BLOOD LEFT HAND  Result Value Ref Range Status   Specimen Description BLOOD LEFT HAND  Final   Special Requests   Final    BOTTLES DRAWN AEROBIC AND ANAEROBIC Blood Culture results may not be optimal due to an inadequate volume of blood received in culture bottles   Culture   Final    NO GROWTH 3 DAYS Performed at [facility], [place] [address] [place], [place]  [address]    Report Status PENDING  Incomplete  SARS Coronavirus 2 by RT PCR (hospital order, performed in [facility] hospital lab) *cepheid single result test* Anterior Nasal Swab     Status: None   Collection Time: 01/27/22  9:43 AM   Specimen: Anterior Nasal Swab  Result Value Ref Range Status   SARS Coronavirus 2 by RT PCR NEGATIVE NEGATIVE Final  Comment: (NOTE) SARS-CoV-2 target nucleic acids are NOT DETECTED.  The SARS-CoV-2 RNA is generally detectable in upper and lower respiratory specimens during the acute phase of infection. The lowest concentration of SARS-CoV-2 viral copies this assay can detect is 250 copies / mL. A negative result does not preclude SARS-CoV-2 infection and should not be used as the sole basis for treatment or other patient management decisions.  A negative result may occur with improper specimen collection / handling, submission of specimen other than nasopharyngeal swab, presence of viral mutation(s) within the areas targeted by this assay, and inadequate number of viral copies (<250 copies / mL). A negative result must be combined with clinical observations, patient history, and epidemiological information.  Fact Sheet for Patients:   https://www.patel.info/  Fact Sheet for Healthcare Providers: https://hall.com/  This test is not yet approved or  cleared by the [place] FDA and has been authorized for detection and/or diagnosis of SARS-CoV-2 by FDA under an Emergency Use Authorization (EUA).  This EUA will remain in effect (meaning this test can be used) for the duration of the COVID-19 declaration under Section 564(b)(1) of the Act, 21 U.S.C. section 360bbb-3(b)(1), unless the authorization is terminated or revoked sooner.  Performed at [facility], [place] [address], [place], [place] [address]   Urine Culture     Status: Abnormal   Collection Time: 01/27/22  4:33 PM   Specimen: Urine,  Clean Catch  Result Value Ref Range Status   Specimen Description URINE, CLEAN CATCH  Final   Special Requests NONE  Final   Culture (A)  Final    <10,000 COLONIES/mL INSIGNIFICANT GROWTH Performed at [facility], [place] [address] [place], [place] [address]    Report Status 01/28/2022 FINAL  Final     Medications:    amLODipine  5 mg Oral Daily   aspirin EC  81 mg Oral Daily   carvedilol  25 mg Oral BID WC   colchicine  0.6 mg Oral Daily   famotidine  40 mg Oral Daily   heparin  5,000 Units Subcutaneous Q12H   insulin aspart  0-5 Units Subcutaneous QHS   insulin aspart  0-9 Units Subcutaneous TID WC   insulin aspart  2 Units Subcutaneous TID WC   insulin detemir  5 Units Subcutaneous BID   pantoprazole  40 mg Oral Daily   pravastatin  80 mg Oral QHS   predniSONE  40 mg Oral Q breakfast   repaglinide  0.5 mg Oral Q supper   sodium bicarbonate  650 mg Oral BID   torsemide  100 mg Oral Daily   Continuous Infusions:    LOS: 1 day   Charlynne Cousins  [facility]  01/30/2022, 10:05 AM

## 2022-01-30 NOTE — Evaluation (Signed)
Occupational Therapy Evaluation Patient Details Name: Harold Pittman MRN: [id number] DOB: 04-10-1940 Today's Date: 01/30/2022   History of Present Illness 81 y.o. male presents to [facility] ED on 01/25/2022 with chest pain. Workup negative for acute findings. Pt also with general weakness and inability to ambulate. Pt found to have a gout attack. PMHx significant for COPD, CAD s/p CAGB, HTN, DMII, CKD IV, anemia and diastolic CHF.   Clinical Impression   Pt reported they are feeling better today compared to when working with Physical Therapy but still presented with pain in BLE when completion of any WB activities. Pt at this time was able to complete rolling with min guard, supine to sitting with with min guard and HOB elevated. Pt requires set up for UE ADLS and max for LB ADLS at this time. Pt currently with functional limitations due to the deficits listed below (see OT Problem List).  Pt will benefit from skilled OT to increase their safety and independence with ADL and functional mobility for ADL to facilitate discharge to venue listed below.        Recommendations for follow up therapy are one component of a multi-disciplinary discharge planning process, led by the attending physician.  Recommendations may be updated based on patient status, additional functional criteria and insurance authorization.   Follow Up Recommendations  Skilled nursing-short term rehab (<3 hours/day)    Assistance Recommended at Discharge Frequent or constant Supervision/Assistance  Patient can return home with the following A lot of help with walking and/or transfers;A lot of help with bathing/dressing/bathroom;Assistance with cooking/housework;Assist for transportation    Functional Status Assessment  Patient has had a recent decline in their functional status and demonstrates the ability to make significant improvements in function in a reasonable and predictable amount of time.  Equipment Recommendations  None  recommended by OT (TBD)    Recommendations for Other Services       Precautions / Restrictions Precautions Precautions: Fall Restrictions Weight Bearing Restrictions: No      Mobility Bed Mobility Overal bed mobility: Needs Assistance Bed Mobility: Supine to Sit, Rolling Rolling: Min guard   Supine to sit: Min guard, HOB elevated          Transfers Overall transfer level: Needs assistance Equipment used: Rolling walker (2 wheels) Transfers: Sit to/from Stand Sit to Stand: Mod assist, From elevated surface Stand pivot transfers: Mod assist         General transfer comment: Pt requires mod assist to sit to stand and stabilize RW to stand. Pt was able to 3 trials in session but limited tolerance due to pain      Balance Overall balance assessment: Needs assistance Sitting-balance support: Feet supported, Bilateral upper extremity supported Sitting balance-Leahy Scale: Fair     Standing balance support: Bilateral upper extremity supported Standing balance-Leahy Scale: Poor Standing balance comment: reliant on RW                           ADL either performed or assessed with clinical judgement   ADL Overall ADL's : Needs assistance/impaired Eating/Feeding: Set up;Sitting   Grooming: Wash/dry face;Wash/dry hands;Set up;Sitting   Upper Body Bathing: Set up;Sitting   Lower Body Bathing: Maximal assistance;Cueing for safety;Cueing for sequencing;Sit to/from stand   Upper Body Dressing : Set up;Sitting   Lower Body Dressing: Maximal assistance;Sit to/from stand   Toilet Transfer: Cueing for safety;Cueing for sequencing;Rolling walker (2 wheels);Moderate assistance   Toileting- Clothing Manipulation and Hygiene:  Total assistance;Sit to/from stand       Functional mobility during ADLs: Moderate assistance;Rolling walker (2 wheels) General ADL Comments: limited to 2-4 steps with RW     Vision         Perception     Praxis      Pertinent  Vitals/Pain Pain Assessment Pain Assessment: 0-10 Pain Score: 8  Pain Location: B knees and distally with WB Pain Descriptors / Indicators: Grimacing Pain Intervention(s): Limited activity within patient's tolerance, Monitored during session, Repositioned     Hand Dominance Right   Extremity/Trunk Assessment Upper Extremity Assessment Upper Extremity Assessment: Generalized weakness   Lower Extremity Assessment Lower Extremity Assessment: Defer to PT evaluation   Cervical / Trunk Assessment Cervical / Trunk Assessment: Kyphotic   Communication Communication Communication: No difficulties   Cognition Arousal/Alertness: Awake/alert Behavior During Therapy: WFL for tasks assessed/performed Overall Cognitive Status: Impaired/Different from baseline Area of Impairment: Awareness, Problem solving, Following commands                       Following Commands: Follows multi-step commands with increased time   Awareness: Emergent Problem Solving: Requires verbal cues       General Comments       Exercises     Shoulder Instructions      Home Living Family/patient expects to be discharged to:: Private residence Living Arrangements: Alone Available Help at Discharge: Family;Available PRN/intermittently Type of Home: House Home Access: Stairs to enter [employer] of Steps: 1 Entrance Stairs-Rails: Can reach both Home Layout: One level     Bathroom Shower/Tub: Occupational psychologist: Handicapped height     Home Equipment: Cane - single Location manager (2 wheels)          Prior Functioning/Environment Prior Level of Function : Independent/Modified Independent;Driving             Mobility Comments: ambulates with use of RW inside and SPC outdoors          OT Problem List: Decreased strength;Decreased range of motion;Decreased activity tolerance;Impaired balance (sitting and/or standing);Decreased  knowledge of use of DME or AE;Cardiopulmonary status limiting activity;Pain      OT Treatment/Interventions: Self-care/ADL training;DME and/or AE instruction;Therapeutic activities;Patient/family education;Balance training    OT Goals(Current goals can be found in the care plan section) Acute Rehab OT Goals Patient Stated Goal: to get the pain down OT Goal Formulation: With patient Time For Goal Achievement: 02/13/22 Potential to Achieve Goals: Good  OT Frequency: Min 2X/week    Co-evaluation              AM-PAC OT "6 Clicks" Daily Activity     Outcome Measure Help from another person eating meals?: None Help from another person taking care of personal grooming?: A Little Help from another person toileting, which includes using toliet, bedpan, or urinal?: A Lot Help from another person bathing (including washing, rinsing, drying)?: A Lot Help from another person to put on and taking off regular upper body clothing?: A Little Help from another person to put on and taking off regular lower body clothing?: A Lot 6 Click Score: 16   End of Session Equipment Utilized During Treatment: Gait belt;Rolling walker (2 wheels) Nurse Communication: Mobility status  Activity Tolerance: Patient limited by pain Patient left: in chair;with call bell/phone within reach;with chair alarm set  OT Visit Diagnosis: Unsteadiness on feet (R26.81);Other abnormalities of gait and mobility (R26.89);Repeated falls (R29.6);Muscle weakness (generalized) (M62.81);Pain Pain - Right/Left:  Right Pain - part of body:  (BLE)                Time: 8786-7672 OT Time Calculation (min): 30 min Charges:  OT General Charges $OT Visit: 1 Visit OT Evaluation $OT Eval Low Complexity: 1 Low OT Treatments $Self Care/Home Management : 8-22 mins  Joeseph Amor OTR/L  Acute Rehab Services  [phone] office number [phone] pager number   Joeseph Amor 01/30/2022, 9:52 AM

## 2022-01-31 DIAGNOSIS — Z79899 Other long term (current) drug therapy: Secondary | ICD-10-CM | POA: Diagnosis not present

## 2022-01-31 DIAGNOSIS — M109 Gout, unspecified: Secondary | ICD-10-CM | POA: Diagnosis not present

## 2022-01-31 DIAGNOSIS — M1009 Idiopathic gout, multiple sites: Secondary | ICD-10-CM | POA: Diagnosis not present

## 2022-01-31 DIAGNOSIS — Z87891 Personal history of nicotine dependence: Secondary | ICD-10-CM | POA: Diagnosis not present

## 2022-01-31 DIAGNOSIS — I2510 Atherosclerotic heart disease of native coronary artery without angina pectoris: Secondary | ICD-10-CM | POA: Diagnosis not present

## 2022-01-31 DIAGNOSIS — E1151 Type 2 diabetes mellitus with diabetic peripheral angiopathy without gangrene: Secondary | ICD-10-CM | POA: Diagnosis not present

## 2022-01-31 DIAGNOSIS — Z743 Need for continuous supervision: Secondary | ICD-10-CM | POA: Diagnosis not present

## 2022-01-31 DIAGNOSIS — N1832 Chronic kidney disease, stage 3b: Secondary | ICD-10-CM | POA: Diagnosis not present

## 2022-01-31 DIAGNOSIS — R051 Acute cough: Secondary | ICD-10-CM | POA: Diagnosis not present

## 2022-01-31 DIAGNOSIS — M6281 Muscle weakness (generalized): Secondary | ICD-10-CM | POA: Diagnosis not present

## 2022-01-31 DIAGNOSIS — R531 Weakness: Secondary | ICD-10-CM | POA: Diagnosis not present

## 2022-01-31 DIAGNOSIS — R2689 Other abnormalities of gait and mobility: Secondary | ICD-10-CM | POA: Diagnosis not present

## 2022-01-31 DIAGNOSIS — U071 COVID-19: Secondary | ICD-10-CM | POA: Diagnosis not present

## 2022-01-31 DIAGNOSIS — E876 Hypokalemia: Secondary | ICD-10-CM | POA: Diagnosis not present

## 2022-01-31 DIAGNOSIS — E871 Hypo-osmolality and hyponatremia: Secondary | ICD-10-CM | POA: Diagnosis not present

## 2022-01-31 DIAGNOSIS — D508 Other iron deficiency anemias: Secondary | ICD-10-CM | POA: Diagnosis not present

## 2022-01-31 DIAGNOSIS — Z951 Presence of aortocoronary bypass graft: Secondary | ICD-10-CM | POA: Diagnosis not present

## 2022-01-31 DIAGNOSIS — Z7409 Other reduced mobility: Secondary | ICD-10-CM | POA: Diagnosis not present

## 2022-01-31 DIAGNOSIS — R270 Ataxia, unspecified: Secondary | ICD-10-CM | POA: Diagnosis not present

## 2022-01-31 DIAGNOSIS — I10 Essential (primary) hypertension: Secondary | ICD-10-CM | POA: Diagnosis not present

## 2022-01-31 DIAGNOSIS — D72829 Elevated white blood cell count, unspecified: Secondary | ICD-10-CM | POA: Diagnosis not present

## 2022-01-31 DIAGNOSIS — M62838 Other muscle spasm: Secondary | ICD-10-CM | POA: Diagnosis not present

## 2022-01-31 DIAGNOSIS — R2681 Unsteadiness on feet: Secondary | ICD-10-CM | POA: Diagnosis not present

## 2022-01-31 DIAGNOSIS — R52 Pain, unspecified: Secondary | ICD-10-CM | POA: Diagnosis not present

## 2022-01-31 DIAGNOSIS — E1122 Type 2 diabetes mellitus with diabetic chronic kidney disease: Secondary | ICD-10-CM | POA: Diagnosis not present

## 2022-01-31 DIAGNOSIS — R488 Other symbolic dysfunctions: Secondary | ICD-10-CM | POA: Diagnosis not present

## 2022-01-31 DIAGNOSIS — K2100 Gastro-esophageal reflux disease with esophagitis, without bleeding: Secondary | ICD-10-CM | POA: Diagnosis not present

## 2022-01-31 DIAGNOSIS — E119 Type 2 diabetes mellitus without complications: Secondary | ICD-10-CM | POA: Diagnosis not present

## 2022-01-31 DIAGNOSIS — I5032 Chronic diastolic (congestive) heart failure: Secondary | ICD-10-CM | POA: Diagnosis not present

## 2022-01-31 DIAGNOSIS — N189 Chronic kidney disease, unspecified: Secondary | ICD-10-CM | POA: Diagnosis not present

## 2022-01-31 DIAGNOSIS — M6259 Muscle wasting and atrophy, not elsewhere classified, multiple sites: Secondary | ICD-10-CM | POA: Diagnosis not present

## 2022-01-31 DIAGNOSIS — E569 Vitamin deficiency, unspecified: Secondary | ICD-10-CM | POA: Diagnosis not present

## 2022-01-31 DIAGNOSIS — I130 Hypertensive heart and chronic kidney disease with heart failure and stage 1 through stage 4 chronic kidney disease, or unspecified chronic kidney disease: Secondary | ICD-10-CM | POA: Diagnosis not present

## 2022-01-31 DIAGNOSIS — I5020 Unspecified systolic (congestive) heart failure: Secondary | ICD-10-CM | POA: Diagnosis not present

## 2022-01-31 DIAGNOSIS — Z1152 Encounter for screening for COVID-19: Secondary | ICD-10-CM | POA: Diagnosis not present

## 2022-01-31 DIAGNOSIS — Z794 Long term (current) use of insulin: Secondary | ICD-10-CM | POA: Diagnosis not present

## 2022-01-31 DIAGNOSIS — N184 Chronic kidney disease, stage 4 (severe): Secondary | ICD-10-CM | POA: Diagnosis not present

## 2022-01-31 LAB — GLUCOSE, CAPILLARY: Glucose-Capillary: 105 mg/dL — ABNORMAL HIGH (ref 70–99)

## 2022-01-31 MED ORDER — PREDNISONE 10 MG PO TABS: ORAL_TABLET | ORAL | 0 refills | Status: DC

## 2022-01-31 MED ORDER — INSULIN ASPART 100 UNIT/ML IJ SOLN: 4.0000 [IU] | Freq: Three times a day (TID) | INTRAMUSCULAR | Status: DC

## 2022-01-31 MED ORDER — HYDROCODONE-ACETAMINOPHEN 5-325 MG PO TABS: 0.5000 | ORAL_TABLET | Freq: Four times a day (QID) | ORAL | 0 refills | Status: DC | PRN

## 2022-01-31 MED ORDER — INSULIN DETEMIR 100 UNIT/ML ~~LOC~~ SOLN: 10.0000 [IU] | Freq: Every day | SUBCUTANEOUS | 0 refills | Status: DC

## 2022-01-31 NOTE — Discharge Summary (Signed)
Physician Discharge Summary  Harold Pittman ZDG:[id number] DOB: 01-18-1941 DOA: 01/25/2022  PCP: Cassandria Anger, MD  Admit date: 01/25/2022 Discharge date: 01/31/2022  Admitted From: Home Disposition:  SNF  Recommendations for Outpatient Follow-up:  Follow up with PCP in 1-2 weeks Please obtain BMP/CBC in one week   Home Health:No Equipment/Devices:None  Discharge Condition:Stable CODE STATUS:Full Diet recommendation: Heart Healthy  Brief/Interim Summary: 81 y.o. 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  Discharge Diagnoses:  Principal Problem:   Gout attack Active Problems:   Essential hypertension   DM (diabetes mellitus), type 2 with peripheral vascular complications (HCC)   (HFpEF) heart failure with preserved ejection fraction (HCC)   Gout flare  The patient was at home on colchicine he was started on high-dose steroids. He is not a candidate for allopurinol due to renal dysfunction. Imaging showed erosion of the bone in the right ankle. After 2 to 3 days he was able to move physical therapy was consulted and recommended short-term rehab.  SIRS likely due to acute gout flare: Not septic afebrile see above for the details.  Hypokalemia: Replete orally now resolved.  Mild hyponatremia: Remain asymptomatic resolved with fluid resuscitation.  Essential hypertension: Continue current regimen no changes made.  Chronic kidney disease stage IV: Creatinine at baseline.  Insulin-dependent diabetes mellitus type 2: His blood glucose they  became erratic he was started on long-acting insulin plus sliding scale his blood glucose was not well controlled. He will need to go on long-acting insulin for about 7 days then we could stop as he we will wean him down from steroids.  He also continue his current home regimen.  Discharge Instructions  Discharge Instructions     Diet - low sodium heart healthy   Complete by: As directed    Increase activity slowly   Complete by: As directed       Allergies as of 01/31/2022       Reactions   Benazepril Shortness Of Breath   Cough, wheezing   Gabapentin Other (See Comments)   myoclonus        Medication List     TAKE these medications    amLODipine 10 MG tablet Commonly known as: NORVASC Take 0.5 tablets (5 mg total) by mouth daily.   aspirin EC 81 MG tablet Take 1 tablet (81 mg total) by mouth daily.   B Complex Plus Tabs Take 1 tablet by mouth daily.   carvedilol 25 MG tablet Commonly known as: COREG Take 1 tablet (25 mg total) by mouth 2 (two) times daily with a meal.   colchicine 0.6 MG tablet TAKE 1 TABLET(0.6 MG) BY MOUTH DAILY What changed: See the new instructions.   cyclobenzaprine 10 MG tablet Commonly known as: FLEXERIL Take 0.5 tablets (5 mg total) by mouth 2 (two) times daily as needed for muscle spasms.   ferrous sulfate 325 (65 FE) MG tablet Take 325 mg by mouth at bedtime.   HYDROcodone-acetaminophen 5-325 MG tablet Commonly known as: Norco Take 0.5-1 tablets by mouth every 6 (six) hours as needed for severe pain.   omeprazole  40 MG capsule Commonly known as: PRILOSEC Take 1 capsule (40 mg total) by mouth daily.   OneTouch Delica Plus [id number] Misc USE AS DIRECTED   OneTouch Verio Flex System w/Device Kit USE TO MONITOR BLOOD SUGAR   OneTouch Verio test strip Generic drug: glucose blood CHECK BLOOD SUGAR 1 TO 2  TIMES DAILY AS DIRECTED   pravastatin 80 MG tablet Commonly known as: PRAVACHOL Take 1 tablet (80 mg total) by mouth  at bedtime.   predniSONE 10 MG tablet Commonly known as: DELTASONE Takes 6 tablets for 2 days, then 5 tablets for 2 days, then 4 tablets for 2 days, then 3 tablets for 2 days, then 2 tabs for 12 days, then 1 tab for 2 days, and then stop.   repaglinide 1 MG tablet Commonly known as: Prandin 1 po before or with dinner What changed:  how much to take how to take this when to take this additional instructions   torsemide 100 MG tablet Commonly known as: DEMADEX TAKE 1 TABLET BY MOUTH DAILY   Vitamin D3 50 MCG (2000 UT) capsule Take 1 capsule (2,000 Units total) by mouth daily.        Follow-up Information     Plotnikov, Evie Lacks, MD.   Specialty: Internal Medicine Contact information: [place] [address] [phone]                Allergies  Allergen Reactions   Benazepril Shortness Of Breath    Cough, wheezing   Gabapentin Other (See Comments)    myoclonus    Consultations: None   Procedures/Studies: DG Ankle 2 Views Right  Result Date: 01/27/2022 CLINICAL DATA:  Right ankle pain. EXAM: RIGHT ANKLE - 2 VIEW COMPARISON:  None Available. FINDINGS: There is no evidence of fracture, dislocation, or joint effusion. No significant joint space narrowing is noted. However, there does appear to be erosion involving the distal fibula with overlying soft tissue swelling or edema which may represent gout. IMPRESSION: Possible erosion involving distal fibula with overlying soft tissue swelling or edema suggesting possible gout. No fracture or dislocation is noted. Electronically Signed   By: Marijo Conception M.D.   On: 01/27/2022 19:40   DG Ankle 2 Views Left  Result Date: 01/27/2022 CLINICAL DATA:  Left ankle pain. EXAM: LEFT ANKLE - 2 VIEW COMPARISON:  None Available. FINDINGS: There is no evidence of fracture, dislocation, or joint effusion. There is no evidence of arthropathy or other focal bone abnormality. Soft tissues are unremarkable.  IMPRESSION: Negative. Electronically Signed   By: Marijo Conception M.D.   On: 01/27/2022 19:38   DG Knee 1-2 Views Right  Result Date: 01/27/2022 CLINICAL DATA:  Right knee pain. EXAM: RIGHT KNEE - 1-2 VIEW COMPARISON:  None Available. FINDINGS: No evidence of fracture or dislocation. Small suprapatellar joint effusion is noted. Moderate narrowing of medial joint space is noted. Soft tissues are unremarkable. 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 Small suprapatellar joint effusion. No fracture or dislocation. Electronically Signed   By: Marijo Conception M.D.   On: 01/27/2022 19:37   DG Knee 1-2 Views Left  Result Date: 01/27/2022 CLINICAL DATA:  Left knee pain. EXAM: LEFT KNEE - 1-2 VIEW COMPARISON:  None Available. FINDINGS: No evidence of fracture, dislocation, or joint effusion. Mild narrowing of medial joint space is noted. Soft tissues are unremarkable. IMPRESSION: Mild degenerative joint disease is noted medially. No acute abnormality seen. Electronically Signed   By: Jeneen Rinks  Murlean Caller M.D.   On: 01/27/2022 19:36   CT Abdomen Pelvis Wo Contrast  Result Date: 01/27/2022 CLINICAL DATA:  81 year old male with acute abdominal and pelvic pain and fever. EXAM: CT ABDOMEN AND PELVIS WITHOUT CONTRAST TECHNIQUE: Multidetector CT imaging of the abdomen and pelvis was performed following the standard protocol without IV contrast. RADIATION DOSE REDUCTION: This exam was performed according to the departmental dose-optimization program which includes automated exposure control, adjustment of the mA and/or kV according to patient size and/or use of iterative reconstruction technique. COMPARISON:  10/30/2005 CT.  03/31/2021 MR FINDINGS: Please note that parenchymal and vascular abnormalities may be missed as intravenous contrast was not administered. Lower chest: No acute abnormality. Hepatobiliary: Cholelithiasis identified without CT evidence of acute cholecystitis. Hepatic cysts are  again identified without significant hepatic abnormalities. There is no evidence of intrahepatic or extrahepatic biliary dilatation. Pancreas: Unremarkable Spleen: Unremarkable Adrenals/Urinary Tract: Multiple bilateral renal cysts are again identified and no follow-up imaging recommended. There is no evidence of hydronephrosis or urinary calculi. The adrenal glands and bladder are unremarkable. Stomach/Bowel: Stomach is within normal limits. Appendix appears normal. No evidence of bowel wall thickening, distention, or inflammatory changes. Vascular/Lymphatic: Aortic atherosclerosis. No enlarged abdominal or pelvic lymph nodes. Reproductive: Prostate enlargement again noted. Other: No ascites, focal collection or pneumoperitoneum. Musculoskeletal: No acute or suspicious bony abnormalities are noted. L5-S1 degenerative disc disease/spondylosis again noted. IMPRESSION: 1. No evidence of acute abnormality. 2. Cholelithiasis without CT evidence of acute cholecystitis. 3. Prostate enlargement. 4.  Aortic Atherosclerosis (ICD10-I70.0). Electronically Signed   By: Margarette Canada M.D.   On: 01/27/2022 13:17   VAS [place] LOWER EXTREMITY VENOUS (DVT) (ONLY [facility] & [facility])  Result Date: 01/26/2022  Lower Venous DVT Study Patient Name:  Harold Pittman  Date of Exam:   01/26/2022 Medical Rec #: [id number]        Accession #:    [id number] Date of Birth: December 15, 1940         Patient Gender: M Patient Age:   32 years Exam Location:  [facility] Procedure:      VAS [place] LOWER EXTREMITY VENOUS (DVT) Referring Phys: COOPER ROBBINS --------------------------------------------------------------------------------  Indications: Edema.  Comparison Study: No prior study Performing Technologist: Maudry Mayhew MHA, RDMS, RVT, RDCS  Examination Guidelines: A complete evaluation includes B-mode imaging, spectral Doppler, color Doppler, and power Doppler as needed of all accessible portions of each vessel. Bilateral testing is considered an  integral part of a complete examination. Limited examinations for reoccurring indications may be performed as noted. The reflux portion of the exam is performed with the patient in reverse Trendelenburg.  +---------+---------------+---------+-----------+----------+--------------+ RIGHT    CompressibilityPhasicitySpontaneityPropertiesThrombus Aging +---------+---------------+---------+-----------+----------+--------------+ CFV      Full           Yes      Yes                                 +---------+---------------+---------+-----------+----------+--------------+ SFJ      Full                                                        +---------+---------------+---------+-----------+----------+--------------+ FV Prox  Full                                                        +---------+---------------+---------+-----------+----------+--------------+  FV Mid   Full                                                        +---------+---------------+---------+-----------+----------+--------------+ FV DistalFull                                                        +---------+---------------+---------+-----------+----------+--------------+ PFV      Full                                                        +---------+---------------+---------+-----------+----------+--------------+ POP      Full           Yes      Yes                                 +---------+---------------+---------+-----------+----------+--------------+ PTV      Full                                                        +---------+---------------+---------+-----------+----------+--------------+ PERO     Full                                                        +---------+---------------+---------+-----------+----------+--------------+   +---------+---------------+---------+-----------+----------+--------------+ LEFT     CompressibilityPhasicitySpontaneityPropertiesThrombus  Aging +---------+---------------+---------+-----------+----------+--------------+ CFV      Full           Yes      Yes                                 +---------+---------------+---------+-----------+----------+--------------+ SFJ      Full                                                        +---------+---------------+---------+-----------+----------+--------------+ FV Prox  Full                                                        +---------+---------------+---------+-----------+----------+--------------+ FV Mid   Full                                                        +---------+---------------+---------+-----------+----------+--------------+  FV DistalFull                                                        +---------+---------------+---------+-----------+----------+--------------+ PFV      Full                                                        +---------+---------------+---------+-----------+----------+--------------+ POP      Full           Yes      Yes                                 +---------+---------------+---------+-----------+----------+--------------+ PTV      Full                                                        +---------+---------------+---------+-----------+----------+--------------+ PERO     Full                                                        +---------+---------------+---------+-----------+----------+--------------+     Summary: RIGHT: - There is no evidence of deep vein thrombosis in the lower extremity.  - No cystic structure found in the popliteal fossa.  LEFT: - There is no evidence of deep vein thrombosis in the lower extremity.  - No cystic structure found in the popliteal fossa.  *See table(s) above for measurements and observations. Electronically signed by Servando Snare MD on 01/26/2022 at 5:28:02 PM.    Final    CT Chest Wo Contrast  Result Date: 01/26/2022 CLINICAL DATA:  81 year old male  with history of chest pain radiating into the upper back. EXAM: CT CHEST WITHOUT CONTRAST TECHNIQUE: Multidetector CT imaging of the chest was performed following the standard protocol without IV contrast. RADIATION DOSE REDUCTION: This exam was performed according to the departmental dose-optimization program which includes automated exposure control, adjustment of the mA and/or kV according to patient size and/or use of iterative reconstruction technique. COMPARISON:  No priors. FINDINGS: Cardiovascular: Heart size is normal. There is no significant pericardial fluid, thickening or pericardial calcification. There is aortic atherosclerosis, as well as atherosclerosis of the great vessels of the mediastinum and the coronary arteries, including calcified atherosclerotic plaque in the left main, left anterior descending, left circumflex and right coronary arteries. Status post median sternotomy for CABG including LIMA to the LAD. Mediastinum/Nodes: No pathologically enlarged mediastinal or hilar lymph nodes. Please note that accurate exclusion of hilar adenopathy is limited on noncontrast CT scans. Esophagus is unremarkable in appearance. No axillary lymphadenopathy. Lungs/Pleura: No acute consolidative airspace disease. No pleural effusions. Cluster of micro nodularity in the basal aspect of the right lower lobe, strongly favored to be benign areas of mucoid impaction within terminal bronchioles. No other larger more suspicious appearing  pulmonary nodules or masses are noted. Diffuse bronchial wall thickening with mild centrilobular and paraseptal emphysema. Upper Abdomen: Aortic atherosclerosis. Calcified gallstones lying dependently in the gallbladder. Multiple low-attenuation lesions in the visualized liver, incompletely characterized on today's noncontrast CT examination, but statistically likely to represent cysts (no imaging follow-up is recommended). Incompletely imaged lesion in the upper pole of the right  kidney which is complex in appearance with some faint internal calcifications. Additional 1.6 cm high attenuation lesion in the upper pole of the right kidney also noted. 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 Musculoskeletal: Median sternotomy wires. In the soft tissues of the upper anterior left chest wall adjacent to the first costochondral junction and inferior to the medial left clavicle (axial image 23 of series 3 and coronal image 59 of series 5) there is a partially calcified high density lesion measuring 3.1 x 2.4 x 1.9 cm. There are no aggressive appearing lytic or blastic lesions noted in the visualized portions of the skeleton. IMPRESSION: 1. No acute findings in the thorax to account for the patient's symptoms. 2. Unusual partially calcified soft tissue lesion located between the undersurface of the medial left clavicle in the adjacent left first costochondral junction. This is of uncertain etiology and significance, but is favored to represent a benign area of heterotopic ossification. Given the patient's advanced age, repeat noncontrast chest CT is suggested in 3-6 months to ensure the stability of this finding. 3. Aortic atherosclerosis, in addition to left main and three-vessel coronary artery disease. Status post median sternotomy for CABG including LIMA to the LAD. 4. Diffuse bronchial wall thickening with mild centrilobular and paraseptal emphysema; imaging findings suggestive of underlying COPD. 5. Cholelithiasis. 6. Additional incidental findings, as above. Aortic Atherosclerosis (ICD10-I70.0) and Emphysema (ICD10-J43.9). Electronically Signed   By: Vinnie Langton M.D.   On: 01/26/2022 09:30   DG Chest 2 View  Result Date: 01/25/2022 CLINICAL DATA:  Chest pain EXAM: CHEST - 2 VIEW COMPARISON:  05/15/2021 FINDINGS: Stable cardiomediastinal contours status post sternotomy and CABG.  No focal airspace consolidation, pleural effusion, or pneumothorax. IMPRESSION: No active cardiopulmonary disease. Electronically Signed   By: Davina Poke D.O.   On: 01/25/2022 16:04      Subjective: No complaints this morning is related his pain is better  Discharge Exam: Vitals:   01/30/22 2003 01/31/22 0755  BP: 135/77 137/80  Pulse: 60 60  Resp: 18 18  Temp: (!) 97.4 F (36.3 C) 98.3 F (36.8 C)  SpO2: 99% 100%   Vitals:   01/30/22 0911 01/30/22 1312 01/30/22 2003 01/31/22 0755  BP: 137/79 125/72 135/77 137/80  Pulse: 63 (!) 59 60 60  Resp: _0 Temp: 97.9 F (36.6 C) 98.4 F (36.9 C) (!) 97.4 F (36.3 C) 98.3 F (36.8 C)  TempSrc:   Oral Oral  SpO2: 100% 99% 99% 100%  Weight:      Height:        General: Pt is alert, awake, not in acute distress Cardiovascular: RRR, S1/S2 +, no rubs, no gallops Respiratory: CTA bilaterally, no wheezing, no rhonchi Abdominal: Soft, NT, ND, bowel sounds + Extremities: no edema, no cyanosis    The results of significant diagnostics from this hospitalization (including imaging, microbiology, ancillary and laboratory) are listed below for reference.     Microbiology: Recent Results (from the past 240 hour(s))  Resp Panel by RT-PCR (Flu A&B, Covid) Anterior  Nasal Swab     Status: None   Collection Time: 01/26/22  3:42 PM   Specimen: Anterior Nasal Swab  Result Value Ref Range Status   SARS Coronavirus 2 by RT PCR NEGATIVE NEGATIVE Final    Comment: (NOTE) SARS-CoV-2 target nucleic acids are NOT DETECTED.  The SARS-CoV-2 RNA is generally detectable in upper respiratory specimens during the acute phase of infection. The lowest concentration of SARS-CoV-2 viral copies this assay can detect is 138 copies/mL. A negative result does not preclude SARS-Cov-2 infection and should not be used as the sole basis for treatment or other patient management decisions. A negative result may occur with  improper specimen  collection/handling, submission of specimen other than nasopharyngeal swab, presence of viral mutation(s) within the areas targeted by this assay, and inadequate number of viral copies(<138 copies/mL). A negative result must be combined with clinical observations, patient history, and epidemiological information. The expected result is Negative.  Fact Sheet for Patients:  EntrepreneurPulse.com.au  Fact Sheet for Healthcare Providers:  IncredibleEmployment.be  This test is no t yet approved or cleared by the [place] FDA and  has been authorized for detection and/or diagnosis of SARS-CoV-2 by FDA under an Emergency Use Authorization (EUA). This EUA will remain  in effect (meaning this test can be used) for the duration of the COVID-19 declaration under Section 564(b)(1) of the Act, 21 U.S.C.section 360bbb-3(b)(1), unless the authorization is terminated  or revoked sooner.       Influenza A by PCR NEGATIVE NEGATIVE Final   Influenza B by PCR NEGATIVE NEGATIVE Final    Comment: (NOTE) The Xpert Xpress SARS-CoV-2/FLU/RSV plus assay is intended as an aid in the diagnosis of influenza from Nasopharyngeal swab specimens and should not be used as a sole basis for treatment. Nasal washings and aspirates are unacceptable for Xpert Xpress SARS-CoV-2/FLU/RSV testing.  Fact Sheet for Patients: EntrepreneurPulse.com.au  Fact Sheet for Healthcare Providers: IncredibleEmployment.be  This test is not yet approved or cleared by the [place] FDA and has been authorized for detection and/or diagnosis of SARS-CoV-2 by FDA under an Emergency Use Authorization (EUA). This EUA will remain in effect (meaning this test can be used) for the duration of the COVID-19 declaration under Section 564(b)(1) of the Act, 21 U.S.C. section 360bbb-3(b)(1), unless the authorization is terminated or revoked.  Performed at [facility], [place] [address] [place], [place] [address]   Blood culture (routine x 2)     Status: None (Preliminary result)   Collection Time: 01/27/22  8:50 AM   Specimen: BLOOD  Result Value Ref Range Status   Specimen Description BLOOD LEFT ANTECUBITAL  Final   Special Requests   Final    BOTTLES DRAWN AEROBIC AND ANAEROBIC Blood Culture results may not be optimal due to an inadequate volume of blood received in culture bottles   Culture   Final    NO GROWTH 4 DAYS Performed at [facility], [place] [address] [place], [place] [address]    Report Status PENDING  Incomplete  Blood culture (routine x 2)     Status: None (Preliminary result)   Collection Time: 01/27/22  8:59 AM   Specimen: BLOOD LEFT HAND  Result Value Ref Range Status   Specimen Description BLOOD LEFT HAND  Final   Special Requests   Final    BOTTLES DRAWN AEROBIC AND ANAEROBIC Blood Culture results may not be optimal due to an inadequate volume of blood received in culture bottles  Culture   Final    NO GROWTH 4 DAYS Performed at [facility], [place] [address] [place], [place] [address]    Report Status PENDING  Incomplete  SARS Coronavirus 2 by RT PCR (hospital order, performed in [facility] hospital lab) *cepheid single result test* Anterior Nasal Swab     Status: None   Collection Time: 01/27/22  9:43 AM   Specimen: Anterior Nasal Swab  Result Value Ref Range Status   SARS Coronavirus 2 by RT PCR NEGATIVE NEGATIVE Final    Comment: (NOTE) SARS-CoV-2 target nucleic acids are NOT DETECTED.  The SARS-CoV-2 RNA is generally detectable in upper and lower respiratory specimens during the acute phase of infection. The lowest concentration of SARS-CoV-2 viral copies this assay can detect is 250 copies / mL. A negative result does not preclude SARS-CoV-2 infection and should not be used as the sole basis for treatment or other patient management decisions.  A negative result may occur with improper  specimen collection / handling, submission of specimen other than nasopharyngeal swab, presence of viral mutation(s) within the areas targeted by this assay, and inadequate number of viral copies (<250 copies / mL). A negative result must be combined with clinical observations, patient history, and epidemiological information.  Fact Sheet for Patients:   https://www.patel.info/  Fact Sheet for Healthcare Providers: https://hall.com/  This test is not yet approved or  cleared by the [place] FDA and has been authorized for detection and/or diagnosis of SARS-CoV-2 by FDA under an Emergency Use Authorization (EUA).  This EUA will remain in effect (meaning this test can be used) for the duration of the COVID-19 declaration under Section 564(b)(1) of the Act, 21 U.S.C. section 360bbb-3(b)(1), unless the authorization is terminated or revoked sooner.  Performed at [facility], [place] [address], [place], [place] [address]   Urine Culture     Status: Abnormal   Collection Time: 01/27/22  4:33 PM   Specimen: Urine, Clean Catch  Result Value Ref Range Status   Specimen Description URINE, CLEAN CATCH  Final   Special Requests NONE  Final   Culture (A)  Final    <10,000 COLONIES/mL INSIGNIFICANT GROWTH Performed at [facility], [place] [address], [place], [place] [address]    Report Status 01/28/2022 FINAL  Final     Labs: BNP (last 3 results) No results for input(s): "BNP" in the last 8760 hours. Basic Metabolic Panel: Recent Labs  Lab 01/25/22 1527 01/27/22 0859 01/28/22 0300  NA 141 134* 137  K 3.7 3.2* 3.5  CL 100 98 98  CO2 25 16* 25  GLUCOSE 111* 169* 192*  BUN 33* 36* 44*  CREATININE 2.56* 2.43* 2.80*  CALCIUM 10.0 8.5* 8.8*   Liver Function Tests: Recent Labs  Lab 01/25/22 1527 01/27/22 0859  AST 20 21  ALT 13 13  ALKPHOS 84 72  BILITOT 1.1 1.3*  PROT 7.4 6.8  ALBUMIN 4.1 3.4*   No results for  input(s): "LIPASE", "AMYLASE" in the last 168 hours. No results for input(s): "AMMONIA" in the last 168 hours. CBC: Recent Labs  Lab 01/25/22 1527 01/27/22 0859 01/27/22 2116 01/28/22 0300  WBC 9.0 16.9*  --  16.5*  NEUTROABS  --   --  15.0*  --   HGB 11.3* 10.5*  --  9.3*  HCT 32.9* 30.9*  --  27.1*  MCV 84.6 84.2  --  83.6  PLT 286 278  --  226   Cardiac Enzymes:  No results for input(s): "CKTOTAL", "CKMB", "CKMBINDEX", "TROPONINI" in the last 168 hours. BNP: Invalid input(s): "POCBNP" CBG: Recent Labs  Lab 01/30/22 0730 01/30/22 1139 01/30/22 1604 01/30/22 2009 01/31/22 0753  GLUCAP 122* 166* 221* 218* 105*   D-Dimer No results for input(s): "DDIMER" in the last 72 hours. Hgb A1c No results for input(s): "HGBA1C" in the last 72 hours. Lipid Profile No results for input(s): "CHOL", "HDL", "LDLCALC", "TRIG", "CHOLHDL", "LDLDIRECT" in the last 72 hours. Thyroid function studies No results for input(s): "TSH", "T4TOTAL", "T3FREE", "THYROIDAB" in the last 72 hours.  Invalid input(s): "FREET3" Anemia work up No results for input(s): "VITAMINB12", "FOLATE", "FERRITIN", "TIBC", "IRON", "RETICCTPCT" in the last 72 hours. Urinalysis    Component Value Date/Time   COLORURINE YELLOW 01/27/2022 0811   APPEARANCEUR CLEAR 01/27/2022 0811   LABSPEC 1.020 01/27/2022 0811   PHURINE 5.5 01/27/2022 0811   GLUCOSEU NEGATIVE 01/27/2022 0811   GLUCOSEU 100 (A) 10/20/2018 1624   HGBUR MODERATE (A) 01/27/2022 0811   BILIRUBINUR NEGATIVE 01/27/2022 0811   KETONESUR NEGATIVE 01/27/2022 0811   PROTEINUR 100 (A) 01/27/2022 0811   UROBILINOGEN 0.2 10/20/2018 1624   NITRITE NEGATIVE 01/27/2022 0811   LEUKOCYTESUR NEGATIVE 01/27/2022 0811   Sepsis Labs Recent Labs  Lab 01/25/22 1527 01/27/22 0859 01/28/22 0300  WBC 9.0 16.9* 16.5*   Microbiology Recent Results (from the past 240 hour(s))  Resp Panel by RT-PCR (Flu A&B, Covid) Anterior Nasal Swab     Status: None   Collection  Time: 01/26/22  3:42 PM   Specimen: Anterior Nasal Swab  Result Value Ref Range Status   SARS Coronavirus 2 by RT PCR NEGATIVE NEGATIVE Final    Comment: (NOTE) SARS-CoV-2 target nucleic acids are NOT DETECTED.  The SARS-CoV-2 RNA is generally detectable in upper respiratory specimens during the acute phase of infection. The lowest concentration of SARS-CoV-2 viral copies this assay can detect is 138 copies/mL. A negative result does not preclude SARS-Cov-2 infection and should not be used as the sole basis for treatment or other patient management decisions. A negative result may occur with  improper specimen collection/handling, submission of specimen other than nasopharyngeal swab, presence of viral mutation(s) within the areas targeted by this assay, and inadequate number of viral copies(<138 copies/mL). A negative result must be combined with clinical observations, patient history, and epidemiological information. The expected result is Negative.  Fact Sheet for Patients:  EntrepreneurPulse.com.au  Fact Sheet for Healthcare Providers:  IncredibleEmployment.be  This test is no t yet approved or cleared by the [place] FDA and  has been authorized for detection and/or diagnosis of SARS-CoV-2 by FDA under an Emergency Use Authorization (EUA). This EUA will remain  in effect (meaning this test can be used) for the duration of the COVID-19 declaration under Section 564(b)(1) of the Act, 21 U.S.C.section 360bbb-3(b)(1), unless the authorization is terminated  or revoked sooner.       Influenza A by PCR NEGATIVE NEGATIVE Final   Influenza B by PCR NEGATIVE NEGATIVE Final    Comment: (NOTE) The Xpert Xpress SARS-CoV-2/FLU/RSV plus assay is intended as an aid in the diagnosis of influenza from Nasopharyngeal swab specimens and should not be used as a sole basis for treatment. Nasal washings and aspirates are unacceptable for Xpert Xpress  SARS-CoV-2/FLU/RSV testing.  Fact Sheet for Patients: EntrepreneurPulse.com.au  Fact Sheet for Healthcare Providers: IncredibleEmployment.be  This test is not yet approved or cleared by the [place] FDA and has been authorized for detection and/or diagnosis of SARS-CoV-2  by FDA under an Emergency Use Authorization (EUA). This EUA will remain in effect (meaning this test can be used) for the duration of the COVID-19 declaration under Section 564(b)(1) of the Act, 21 U.S.C. section 360bbb-3(b)(1), unless the authorization is terminated or revoked.  Performed at [facility], [place] [address], [place], [place] [address]   Blood culture (routine x 2)     Status: None (Preliminary result)   Collection Time: 01/27/22  8:50 AM   Specimen: BLOOD  Result Value Ref Range Status   Specimen Description BLOOD LEFT ANTECUBITAL  Final   Special Requests   Final    BOTTLES DRAWN AEROBIC AND ANAEROBIC Blood Culture results may not be optimal due to an inadequate volume of blood received in culture bottles   Culture   Final    NO GROWTH 4 DAYS Performed at [facility], [place] [address], [place], [place] [address]    Report Status PENDING  Incomplete  Blood culture (routine x 2)     Status: None (Preliminary result)   Collection Time: 01/27/22  8:59 AM   Specimen: BLOOD LEFT HAND  Result Value Ref Range Status   Specimen Description BLOOD LEFT HAND  Final   Special Requests   Final    BOTTLES DRAWN AEROBIC AND ANAEROBIC Blood Culture results may not be optimal due to an inadequate volume of blood received in culture bottles   Culture   Final    NO GROWTH 4 DAYS Performed at [facility], [place] [address] [place], [place] [address]    Report Status PENDING  Incomplete  SARS Coronavirus 2 by RT PCR (hospital order, performed in [facility] hospital lab) *cepheid single result test* Anterior Nasal Swab     Status: None   Collection Time:  01/27/22  9:43 AM   Specimen: Anterior Nasal Swab  Result Value Ref Range Status   SARS Coronavirus 2 by RT PCR NEGATIVE NEGATIVE Final    Comment: (NOTE) SARS-CoV-2 target nucleic acids are NOT DETECTED.  The SARS-CoV-2 RNA is generally detectable in upper and lower respiratory specimens during the acute phase of infection. The lowest concentration of SARS-CoV-2 viral copies this assay can detect is 250 copies / mL. A negative result does not preclude SARS-CoV-2 infection and should not be used as the sole basis for treatment or other patient management decisions.  A negative result may occur with improper specimen collection / handling, submission of specimen other than nasopharyngeal swab, presence of viral mutation(s) within the areas targeted by this assay, and inadequate number of viral copies (<250 copies / mL). A negative result must be combined with clinical observations, patient history, and epidemiological information.  Fact Sheet for Patients:   https://www.patel.info/  Fact Sheet for Healthcare Providers: https://hall.com/  This test is not yet approved or  cleared by the [place] FDA and has been authorized for detection and/or diagnosis of SARS-CoV-2 by FDA under an Emergency Use Authorization (EUA).  This EUA will remain in effect (meaning this test can be used) for the duration of the COVID-19 declaration under Section 564(b)(1) of the Act, 21 U.S.C. section 360bbb-3(b)(1), unless the authorization is terminated or revoked sooner.  Performed at [facility], [place] [address] [place], [place] [address]   Urine Culture     Status: Abnormal   Collection Time: 01/27/22  4:33 PM   Specimen: Urine, Clean Catch  Result Value Ref Range Status   Specimen Description URINE, CLEAN CATCH  Final   Special Requests NONE  Final   Culture (A)  Final    <10,000 COLONIES/mL INSIGNIFICANT GROWTH Performed at [facility], [place] [address] [place], [place] [address]    Report Status 01/28/2022 FINAL  Final     Time coordinating discharge: Over 30 minutes  SIGNED:   Charlynne Cousins, MD  [facility] 01/31/2022, 9:38 AM Pager   If 7PM-7AM, please contact night-coverage [email or web address] Password [email or web address]

## 2022-01-31 NOTE — TOC Transition Note (Signed)
Transition of Care [facility]) - CM/SW Discharge Note   Patient Details  Name: Harold Pittman MRN: [id number] Date of Birth: 1941/02/24  Transition of Care [facility]) CM/SW Contact:  Joanne Chars, LCSW Phone Number: 01/31/2022, 10:51 AM   Clinical Narrative:   Pt discharging to [facility].  RN call [phone] for report.      Final next level of care: Skilled Nursing Facility Barriers to Discharge: Barriers Resolved   Patient Goals and CMS Choice        Discharge Placement              Patient chooses bed at:  ([facility]) Patient to be transferred to facility by: [facility] Name of family member notified: daughter [name] Patient and family notified of of transfer: 01/31/22  Discharge Plan and Services                                     Social Determinants of Health (SDOH) Interventions     Readmission Risk Interventions    05/17/2021   11:14 AM  Readmission Risk Prevention Plan  Transportation Screening Complete  PCP or Specialist Appt within 3-5 Days Complete  HRI or [place] Complete  Social Work Consult for [place] Planning/Counseling Complete  Palliative Care Screening Not Applicable  Medication Review [employer]) Complete

## 2022-01-31 NOTE — Progress Notes (Signed)
Mobility Specialist Progress Note   01/31/22 1005  Mobility  Activity Transferred from bed to chair  Level of Assistance Moderate assist, patient does 50-74%  Assistive Device Front wheel walker;Stedy  Activity Response Tolerated fair  $Mobility charge 1 Mobility   Pre Mobility: 69 HR, 98% SpO2 During Mobility: 78 HR, 88% SpO2 Post Mobility: 64 HR, SpO2  Received in bed having c/o pain (8/10) in BLE but agreeable to mobility. X2 standing trials w/ modA but unable to take steps forward d/t increase pain in bilat feet. Mod cues given throughout for hand placement and safe RW mechanics. Used stedy to transfer pt to chair w/o fault but c/o discomfort in knees once settled. Left w/ call bell in reach and chair alarm on.   Holland Falling Mobility Specialist Acute Rehab Office:  [phone]

## 2022-01-31 NOTE — Progress Notes (Signed)
Report is given to [name] at [facility]

## 2022-02-01 DIAGNOSIS — E119 Type 2 diabetes mellitus without complications: Secondary | ICD-10-CM | POA: Diagnosis not present

## 2022-02-01 DIAGNOSIS — I10 Essential (primary) hypertension: Secondary | ICD-10-CM | POA: Diagnosis not present

## 2022-02-01 DIAGNOSIS — M109 Gout, unspecified: Secondary | ICD-10-CM | POA: Diagnosis not present

## 2022-02-01 DIAGNOSIS — I5020 Unspecified systolic (congestive) heart failure: Secondary | ICD-10-CM | POA: Diagnosis not present

## 2022-02-01 LAB — CULTURE, BLOOD (ROUTINE X 2)
Culture: NO GROWTH
Culture: NO GROWTH

## 2022-02-05 DIAGNOSIS — R051 Acute cough: Secondary | ICD-10-CM | POA: Diagnosis not present

## 2022-02-08 DIAGNOSIS — U071 COVID-19: Secondary | ICD-10-CM | POA: Diagnosis not present

## 2022-02-08 DIAGNOSIS — N1832 Chronic kidney disease, stage 3b: Secondary | ICD-10-CM | POA: Diagnosis not present

## 2022-02-08 DIAGNOSIS — D72829 Elevated white blood cell count, unspecified: Secondary | ICD-10-CM | POA: Diagnosis not present

## 2022-02-08 DIAGNOSIS — E119 Type 2 diabetes mellitus without complications: Secondary | ICD-10-CM | POA: Diagnosis not present

## 2022-02-12 DIAGNOSIS — M6281 Muscle weakness (generalized): Secondary | ICD-10-CM | POA: Diagnosis not present

## 2022-02-12 DIAGNOSIS — M109 Gout, unspecified: Secondary | ICD-10-CM | POA: Diagnosis not present

## 2022-02-12 DIAGNOSIS — U071 COVID-19: Secondary | ICD-10-CM | POA: Diagnosis not present

## 2022-02-12 DIAGNOSIS — E119 Type 2 diabetes mellitus without complications: Secondary | ICD-10-CM | POA: Diagnosis not present

## 2022-02-26 DIAGNOSIS — I2510 Atherosclerotic heart disease of native coronary artery without angina pectoris: Secondary | ICD-10-CM | POA: Diagnosis not present

## 2022-02-26 DIAGNOSIS — R270 Ataxia, unspecified: Secondary | ICD-10-CM | POA: Diagnosis not present

## 2022-02-26 DIAGNOSIS — I5032 Chronic diastolic (congestive) heart failure: Secondary | ICD-10-CM | POA: Diagnosis not present

## 2022-02-26 DIAGNOSIS — U071 COVID-19: Secondary | ICD-10-CM | POA: Diagnosis not present

## 2022-02-26 DIAGNOSIS — I130 Hypertensive heart and chronic kidney disease with heart failure and stage 1 through stage 4 chronic kidney disease, or unspecified chronic kidney disease: Secondary | ICD-10-CM | POA: Diagnosis not present

## 2022-02-26 DIAGNOSIS — N184 Chronic kidney disease, stage 4 (severe): Secondary | ICD-10-CM | POA: Diagnosis not present

## 2022-02-26 DIAGNOSIS — E1142 Type 2 diabetes mellitus with diabetic polyneuropathy: Secondary | ICD-10-CM | POA: Diagnosis not present

## 2022-02-26 DIAGNOSIS — M1000 Idiopathic gout, unspecified site: Secondary | ICD-10-CM | POA: Diagnosis not present

## 2022-02-26 DIAGNOSIS — G5601 Carpal tunnel syndrome, right upper limb: Secondary | ICD-10-CM | POA: Diagnosis not present

## 2022-02-26 DIAGNOSIS — E1122 Type 2 diabetes mellitus with diabetic chronic kidney disease: Secondary | ICD-10-CM | POA: Diagnosis not present

## 2022-02-26 DIAGNOSIS — G603 Idiopathic progressive neuropathy: Secondary | ICD-10-CM | POA: Diagnosis not present

## 2022-02-26 DIAGNOSIS — D509 Iron deficiency anemia, unspecified: Secondary | ICD-10-CM | POA: Diagnosis not present

## 2022-02-26 DIAGNOSIS — E1151 Type 2 diabetes mellitus with diabetic peripheral angiopathy without gangrene: Secondary | ICD-10-CM | POA: Diagnosis not present

## 2022-02-26 DIAGNOSIS — D72829 Elevated white blood cell count, unspecified: Secondary | ICD-10-CM | POA: Diagnosis not present

## 2022-02-26 DIAGNOSIS — F410 Panic disorder [episodic paroxysmal anxiety] without agoraphobia: Secondary | ICD-10-CM | POA: Diagnosis not present

## 2022-02-26 DIAGNOSIS — J449 Chronic obstructive pulmonary disease, unspecified: Secondary | ICD-10-CM | POA: Diagnosis not present

## 2022-03-12 ENCOUNTER — Ambulatory Visit: Payer: Medicare Other | Admitting: Internal Medicine

## 2022-03-13 ENCOUNTER — Telehealth: Payer: Self-pay

## 2022-03-13 NOTE — Telephone Encounter (Signed)
Called patient lvm to return call, to complete AWV at [phone]. Appt has been rescheduled for in-office and letter mailed to patient.

## 2022-03-19 ENCOUNTER — Telehealth: Payer: Self-pay | Admitting: Internal Medicine

## 2022-03-19 NOTE — Telephone Encounter (Signed)
[facility]:  Contact Tillie Rung   Phone:  [place] called - she reports that patient's gout is flaring up and wants to know if there is something your can prescribe to keep him from going to the ER.

## 2022-03-20 ENCOUNTER — Encounter: Payer: Self-pay | Admitting: Internal Medicine

## 2022-03-20 DIAGNOSIS — E119 Type 2 diabetes mellitus without complications: Secondary | ICD-10-CM | POA: Diagnosis not present

## 2022-03-20 DIAGNOSIS — H25811 Combined forms of age-related cataract, right eye: Secondary | ICD-10-CM | POA: Diagnosis not present

## 2022-03-20 DIAGNOSIS — H0102A Squamous blepharitis right eye, upper and lower eyelids: Secondary | ICD-10-CM | POA: Diagnosis not present

## 2022-03-20 DIAGNOSIS — H04123 Dry eye syndrome of bilateral lacrimal glands: Secondary | ICD-10-CM | POA: Diagnosis not present

## 2022-03-20 LAB — HM DIABETES EYE EXAM

## 2022-03-22 MED ORDER — METHYLPREDNISOLONE 4 MG PO TBPK: ORAL_TABLET | ORAL | 0 refills | Status: DC

## 2022-03-22 NOTE — Telephone Encounter (Signed)
Notified Tillie Rung MD sent rx to [facility]Marland KitchenJohny Chess

## 2022-03-22 NOTE — Telephone Encounter (Signed)
Medrol pack was sent to [facility].  Thanks

## 2022-03-22 NOTE — Addendum Note (Signed)
Addended by: Cassandria Anger on: 03/22/2022 07:24 AM   Modules accepted: Orders

## 2022-03-28 ENCOUNTER — Encounter: Payer: Self-pay | Admitting: Internal Medicine

## 2022-03-28 ENCOUNTER — Ambulatory Visit (INDEPENDENT_AMBULATORY_CARE_PROVIDER_SITE_OTHER): Payer: Medicare Other | Admitting: Internal Medicine

## 2022-03-28 VITALS — BP 128/66 | HR 75 | Temp 97.7°F | Ht 72.0 in | Wt 162.1 lb

## 2022-03-28 DIAGNOSIS — M545 Low back pain, unspecified: Secondary | ICD-10-CM

## 2022-03-28 DIAGNOSIS — E1151 Type 2 diabetes mellitus with diabetic peripheral angiopathy without gangrene: Secondary | ICD-10-CM

## 2022-03-28 DIAGNOSIS — I10 Essential (primary) hypertension: Secondary | ICD-10-CM

## 2022-03-28 DIAGNOSIS — M109 Gout, unspecified: Secondary | ICD-10-CM | POA: Insufficient documentation

## 2022-03-28 DIAGNOSIS — M19071 Primary osteoarthritis, right ankle and foot: Secondary | ICD-10-CM | POA: Diagnosis not present

## 2022-03-28 MED ORDER — ALLOPURINOL 100 MG PO TABS: 100.0000 mg | ORAL_TABLET | Freq: Every day | ORAL | 3 refills | Status: DC

## 2022-03-28 MED ORDER — PREDNISONE 10 MG PO TABS: ORAL_TABLET | ORAL | 0 refills | Status: DC

## 2022-03-28 MED ORDER — COLCHICINE 0.6 MG PO TABS: 0.6000 mg | ORAL_TABLET | Freq: Two times a day (BID) | ORAL | 3 refills | Status: DC

## 2022-03-28 MED ORDER — METHYLPREDNISOLONE ACETATE 80 MG/ML IJ SUSP: 80.0000 mg | Freq: Once | INTRAMUSCULAR | Status: DC

## 2022-03-28 NOTE — Assessment & Plan Note (Addendum)
Mild to mod, for depomedrol 80 mg IM, for prednisone taper, start allopurinol 100 mg renal dosed qd, increase colchicine to bid, to check uric acid at next labs,  to f/u any worsening symptoms or concerns

## 2022-03-28 NOTE — Progress Notes (Signed)
Patient ID: Harold Pittman, male   DOB: December 09, 1940, 81 y.o.   MRN: [id number]        Chief Complaint: follow up acute gouty arthritis flare left foot, right ankle OA swelling, DM, htn       HPI:  Harold Pittman is a 81 y.o. male here with c/o left first MTP severe pain, tender, swelling redness for 1 wk, though somewhat improved overall after he increased the colchicine to bid.  Was hospd recently with severe polyarthritis due to gout.   Pt denies fever, wt loss, night sweats, loss of appetite, or other constitutional symptoms  Pt denies chest pain, increased sob or doe, wheezing, orthopnea, PND, increased LE swelling, palpitations, dizziness or syncope.  Pt denies polydipsia, polyuria.         Wt Readings from Last 3 Encounters:  03/28/22 162 lb 2 oz (73.5 kg)  01/28/22 175 lb (79.4 kg)  01/15/22 173 lb 12.8 oz (78.8 kg)   BP Readings from Last 3 Encounters:  03/28/22 128/66  01/31/22 131/79  01/15/22 134/68         Past Medical History:  Diagnosis Date   Anemia    low iron   Anxiety state, unspecified    Blood in stool    Chronic airway obstruction, not elsewhere classified    Coronary artery disease    Coronary atherosclerosis of artery bypass graft    Dyspnea    ED (erectile dysfunction)    Esophageal reflux    Gout, unspecified    Osteoarthrosis, unspecified whether generalized or localized, unspecified site    Other and unspecified hyperlipidemia    Other specified disorder of rectum and anus    Personal history of tobacco use, presenting hazards to health    Sleep apnea    does not use cpap   Type II or unspecified type diabetes mellitus without mention of complication, not stated as uncontrolled    Unspecified essential hypertension    Past Surgical History:  Procedure Laterality Date   BIOPSY  09/26/2017   Procedure: BIOPSY;  Surgeon: Ladene Artist, MD;  Location: [place];  Service: Endoscopy;;   BIOPSY  02/03/2018   Procedure: BIOPSY;  Surgeon:  Irving Copas., MD;  [facility];  Service: Gastroenterology;;   COLONOSCOPY WITH PROPOFOL N/A 09/26/2017   Procedure: COLONOSCOPY WITH PROPOFOL;  Surgeon: Ladene Artist, MD;  Location: [facility] ENDOSCOPY;  Service: Endoscopy;  Laterality: N/A;   CORONARY ARTERY BYPASS GRAFT  21   LIMA to LAD, sequential saphenous vein graft to the first and second diagonal, sequential saphenous vein graft to the intermediate OM and circumflex and SVG to RCA   ESOPHAGOGASTRODUODENOSCOPY (EGD) WITH PROPOFOL N/A 09/26/2017   Procedure: ESOPHAGOGASTRODUODENOSCOPY (EGD) WITH PROPOFOL;  Surgeon: Ladene Artist, MD;  Location: [facility] ENDOSCOPY;  Service: Endoscopy;  Laterality: N/A;   ESOPHAGOGASTRODUODENOSCOPY (EGD) WITH PROPOFOL N/A 02/03/2018   Procedure: ESOPHAGOGASTRODUODENOSCOPY (EGD);  Surgeon: Irving Copas., MD;  Location: [place];  Service: Gastroenterology;  Laterality: N/A;   KNEE SURGERY     BILATERAL   POLYPECTOMY  09/26/2017   Procedure: POLYPECTOMY;  Surgeon: Ladene Artist, MD;  Location: [place];  Service: Endoscopy;;   [place] INJECTION  02/03/2018   Procedure: SUBMUCOSAL INJECTION;  Surgeon: Irving Copas., MD;  Location: [place];  Service: Gastroenterology;;    reports that he has quit smoking. He has never used smokeless tobacco. He reports that he does not drink  alcohol and does not use drugs. family history includes Coronary artery disease in an other family member; Depression in his sister; Heart disease in his sister; Heart disease (age of onset: 34) in his mother; Hypertension in an other family member; Mental illness in his father; Stroke in his son. Allergies  Allergen Reactions   Benazepril Shortness Of Breath    Cough, wheezing   Gabapentin Other (See Comments)    myoclonus   Current Outpatient Medications on File Prior to Visit  Medication Sig Dispense Refill   amLODipine (NORVASC) 10 MG tablet Take 0.5  tablets (5 mg total) by mouth daily. 90 tablet 1   aspirin 81 MG EC tablet Take 1 tablet (81 mg total) by mouth daily. 100 tablet 3   B Complex-Folic Acid (B COMPLEX PLUS) TABS Take 1 tablet by mouth daily. 100 tablet 3   Blood Glucose Monitoring Suppl (ONETOUCH VERIO FLEX SYSTEM) w/Device KIT USE TO MONITOR BLOOD SUGAR 1 kit 0   carvedilol (COREG) 25 MG tablet Take 1 tablet (25 mg total) by mouth 2 (two) times daily with a meal. 180 tablet 3   Cholecalciferol (VITAMIN D3) 50 MCG (2000 UT) capsule Take 1 capsule (2,000 Units total) by mouth daily. 100 capsule 3   cyclobenzaprine (FLEXERIL) 10 MG tablet Take 0.5 tablets (5 mg total) by mouth 2 (two) times daily as needed for muscle spasms. 10 tablet 0   ferrous sulfate 325 (65 FE) MG tablet Take 325 mg by mouth at bedtime.     glucose blood (ONETOUCH VERIO) test strip CHECK BLOOD SUGAR 1 TO 2  TIMES DAILY AS DIRECTED 200 strip 3   HYDROcodone-acetaminophen (NORCO) 5-325 MG tablet Take 0.5-1 tablets by mouth every 6 (six) hours as needed for severe pain. 5 tablet 0   insulin detemir (LEVEMIR) 100 UNIT/ML injection Inject 0.1 mLs (10 Units total) into the skin daily for 7 days. 0.7 mL 0   Lancets (ONETOUCH DELICA PLUS [id number]) MISC USE AS DIRECTED 200 each 3   omeprazole (PRILOSEC) 40 MG capsule Take 1 capsule (40 mg total) by mouth daily. 90 capsule 3   pravastatin (PRAVACHOL) 80 MG tablet Take 1 tablet (80 mg total) by mouth at bedtime. 90 tablet 3   repaglinide (PRANDIN) 1 MG tablet 1 po before or with dinner (Patient taking differently: Take 1 mg by mouth daily with supper.) 90 tablet 3   torsemide (DEMADEX) 100 MG tablet TAKE 1 TABLET BY MOUTH DAILY (Patient taking differently: Take 100 mg by mouth daily.) 90 tablet 3   No current facility-administered medications on file prior to visit.        ROS:  All others reviewed and negative.  Objective        PE:  BP 128/66   Pulse 75   Temp 97.7 F (36.5 C) (Oral)   Ht 6' (1.829 m)   Wt 162  lb 2 oz (73.5 kg)   SpO2 95%   BMI 21.99 kg/m                 Constitutional: Pt appears in NAD               HENT: Head: NCAT.                Right Ear: External ear normal.                 Left Ear: External ear normal.  Eyes: . Pupils are equal, round, and reactive to light. Conjunctivae and EOM are normal               Nose: without d/c or deformity               Neck: Neck supple. Gross normal ROM               Cardiovascular: Normal rate and regular rhythm.                 Pulmonary/Chest: Effort normal and breath sounds without rales or wheezing.                Abd:  Soft, NT, ND, + BS, no organomegaly               Neurological: Pt is alert. At baseline orientation, motor grossly intact               Skin: Skin is warm, LE edema - none, right ankle with nontender effusion 1+; left first MTP with 2+ red, tender swelling               Psychiatric: Pt behavior is normal without agitation   Micro: none  Cardiac tracings I have personally interpreted today:  none  Pertinent Radiological findings (summarize): none   Lab Results  Component Value Date   WBC 16.5 (H) 01/28/2022   HGB 9.3 (L) 01/28/2022   HCT 27.1 (L) 01/28/2022   PLT 226 01/28/2022   GLUCOSE 192 (H) 01/28/2022   CHOL 120 07/21/2019   TRIG 68.0 07/21/2019   HDL 31.70 (L) 07/21/2019   LDLCALC 75 07/21/2019   ALT 13 01/27/2022   AST 21 01/27/2022   NA 137 01/28/2022   K 3.5 01/28/2022   CL 98 01/28/2022   CREATININE 2.80 (H) 01/28/2022   BUN 44 (H) 01/28/2022   CO2 25 01/28/2022   TSH 0.48 05/03/2021   PSA 8.92 (H) 04/22/2019   INR 1.35 07/23/2015   HGBA1C 5.5 01/28/2022   MICROALBUR 38.7 (H) 11/24/2013   Assessment/Plan:  HILMER ALIBERTI is a 81 y.o. Black or African American [2] male with  has a past medical history of Anemia, Anxiety state, unspecified, Blood in stool, Chronic airway obstruction, not elsewhere classified, Coronary artery disease, Coronary atherosclerosis of artery  bypass graft, Dyspnea, ED (erectile dysfunction), Esophageal reflux, Gout, unspecified, Osteoarthrosis, unspecified whether generalized or localized, unspecified site, Other and unspecified hyperlipidemia, Other specified disorder of rectum and anus, Personal history of tobacco use, presenting hazards to health, Sleep apnea, Type II or unspecified type diabetes mellitus without mention of complication, not stated as uncontrolled, and Unspecified essential hypertension.  DM (diabetes mellitus), type 2 with peripheral vascular complications (HCC) Lab Results  Component Value Date   HGBA1C 5.5 01/28/2022   Stable, pt to continue current medical treatment prandin 1 mg qd   Essential hypertension BP Readings from Last 3 Encounters:  03/28/22 128/66  01/31/22 131/79  01/15/22 134/68   Stable, pt to continue medical treatment norvasc 5 mg , coreg 25 bid   Osteoarthritis Right ankle noted small effusion, not likley gout, cont to monitor  Acute gouty arthritis Mild to mod, for depomedrol 80 mg IM, for prednisone taper, start allopurinol 100 mg renal dosed qd, increase colchicine to bid, to check uric acid at next labs,  to f/u any worsening symptoms or concerns  Followup: Return if symptoms worsen or fail to improve.  Cathlean Cower, MD 03/28/2022 8:48 PM [place]  [facility]

## 2022-03-28 NOTE — Patient Instructions (Addendum)
You had the steroid shot today  Please take all new medication as prescribed - the prednisone for the gout  Ok to increase the colchicine to twice per day  Please take all new medication as prescribed - the low dose allopurinol (kidney dosed)  Please continue all other medications as before, and refills have been done if requested.  Please have the pharmacy call with any other refills you may need.  Please keep your appointments with your specialists as you may have planned  Please see Dr Alain Marion as you have planned

## 2022-03-28 NOTE — Assessment & Plan Note (Signed)
Right ankle noted small effusion, not likley gout, cont to monitor

## 2022-03-28 NOTE — Assessment & Plan Note (Signed)
BP Readings from Last 3 Encounters:  03/28/22 128/66  01/31/22 131/79  01/15/22 134/68   Stable, pt to continue medical treatment norvasc 5 mg , coreg 25 bid

## 2022-03-28 NOTE — Assessment & Plan Note (Signed)
Lab Results  Component Value Date   HGBA1C 5.5 01/28/2022   Stable, pt to continue current medical treatment prandin 1 mg qd

## 2022-04-05 IMAGING — DX DG ELBOW COMPLETE 3+V*L*
4 series · 4 of 4 positions shown · non-contrast
Comparison: None.

CLINICAL DATA: Status post fall.  Left elbow pain.  Swelling

EXAM:
LEFT ELBOW - COMPLETE 3+ VIEW

[elbow ap]
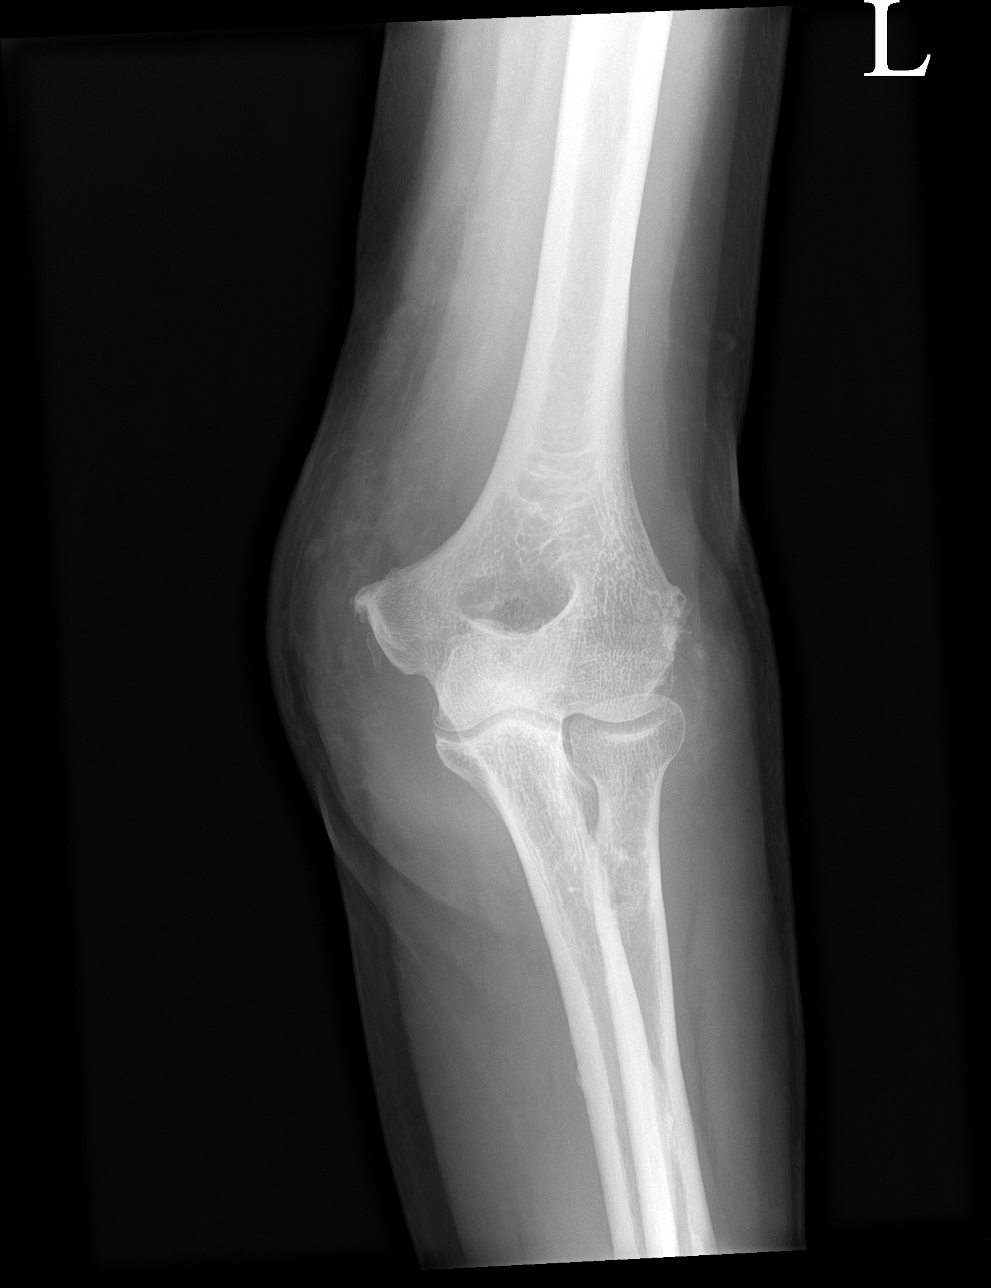

[elbow obl (1 of 2)]
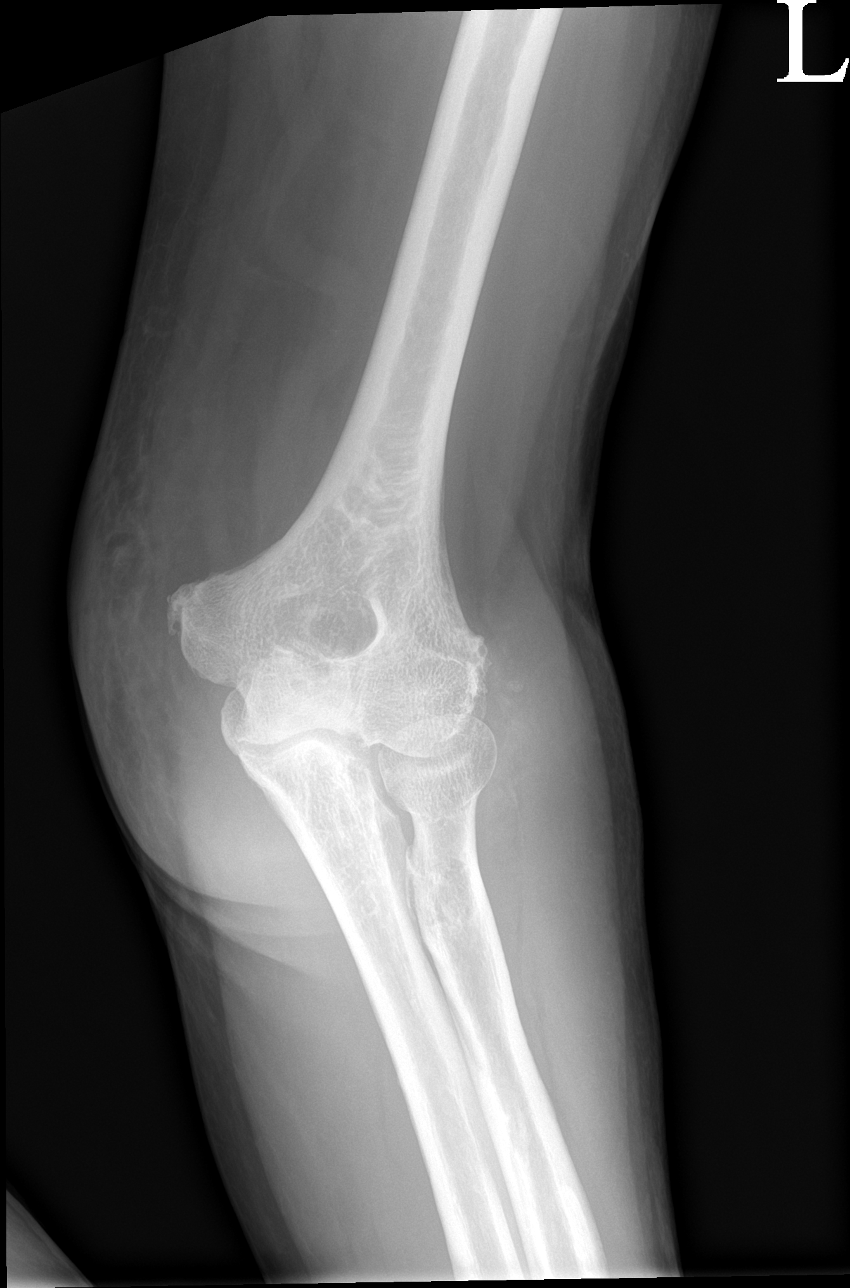

[elbow obl (2 of 2)]
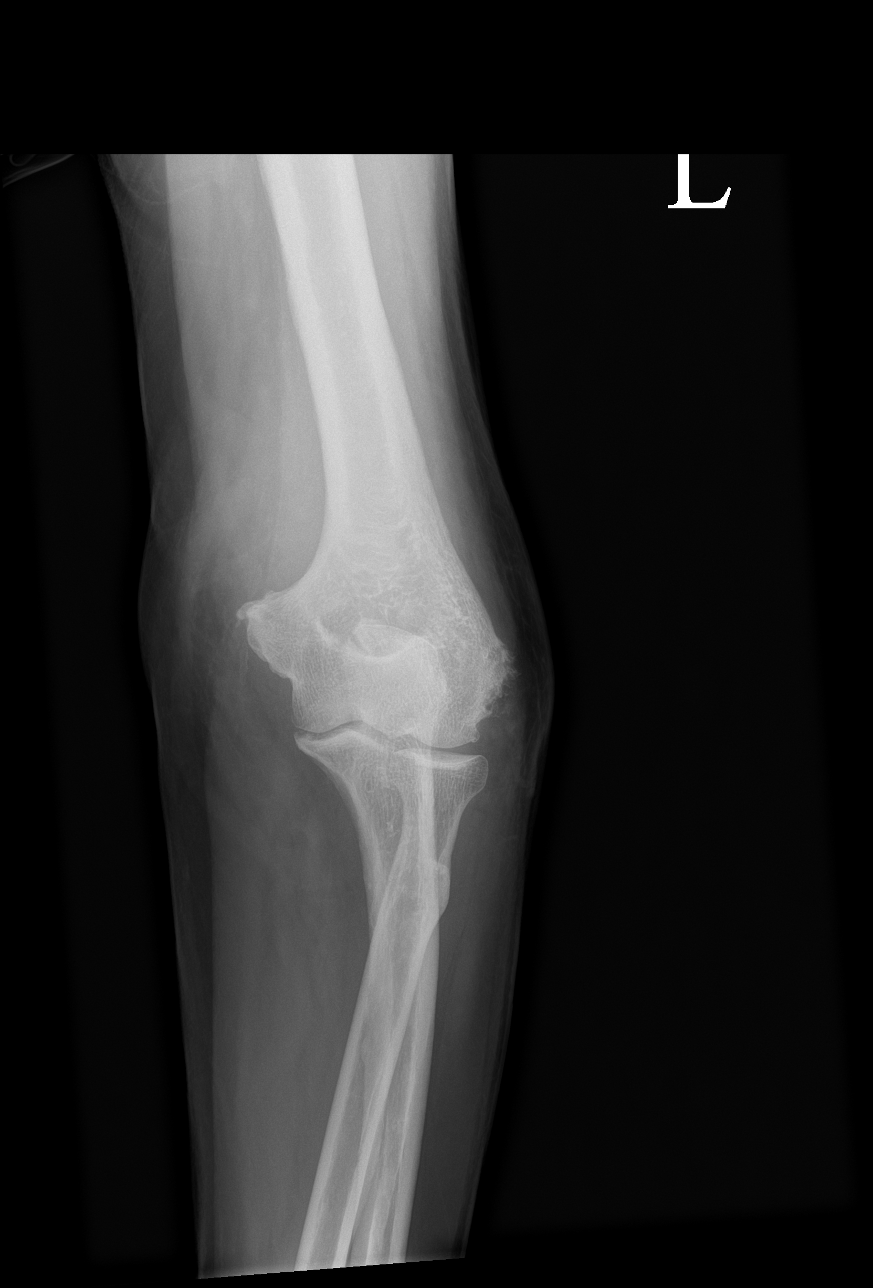

[elbow lat]
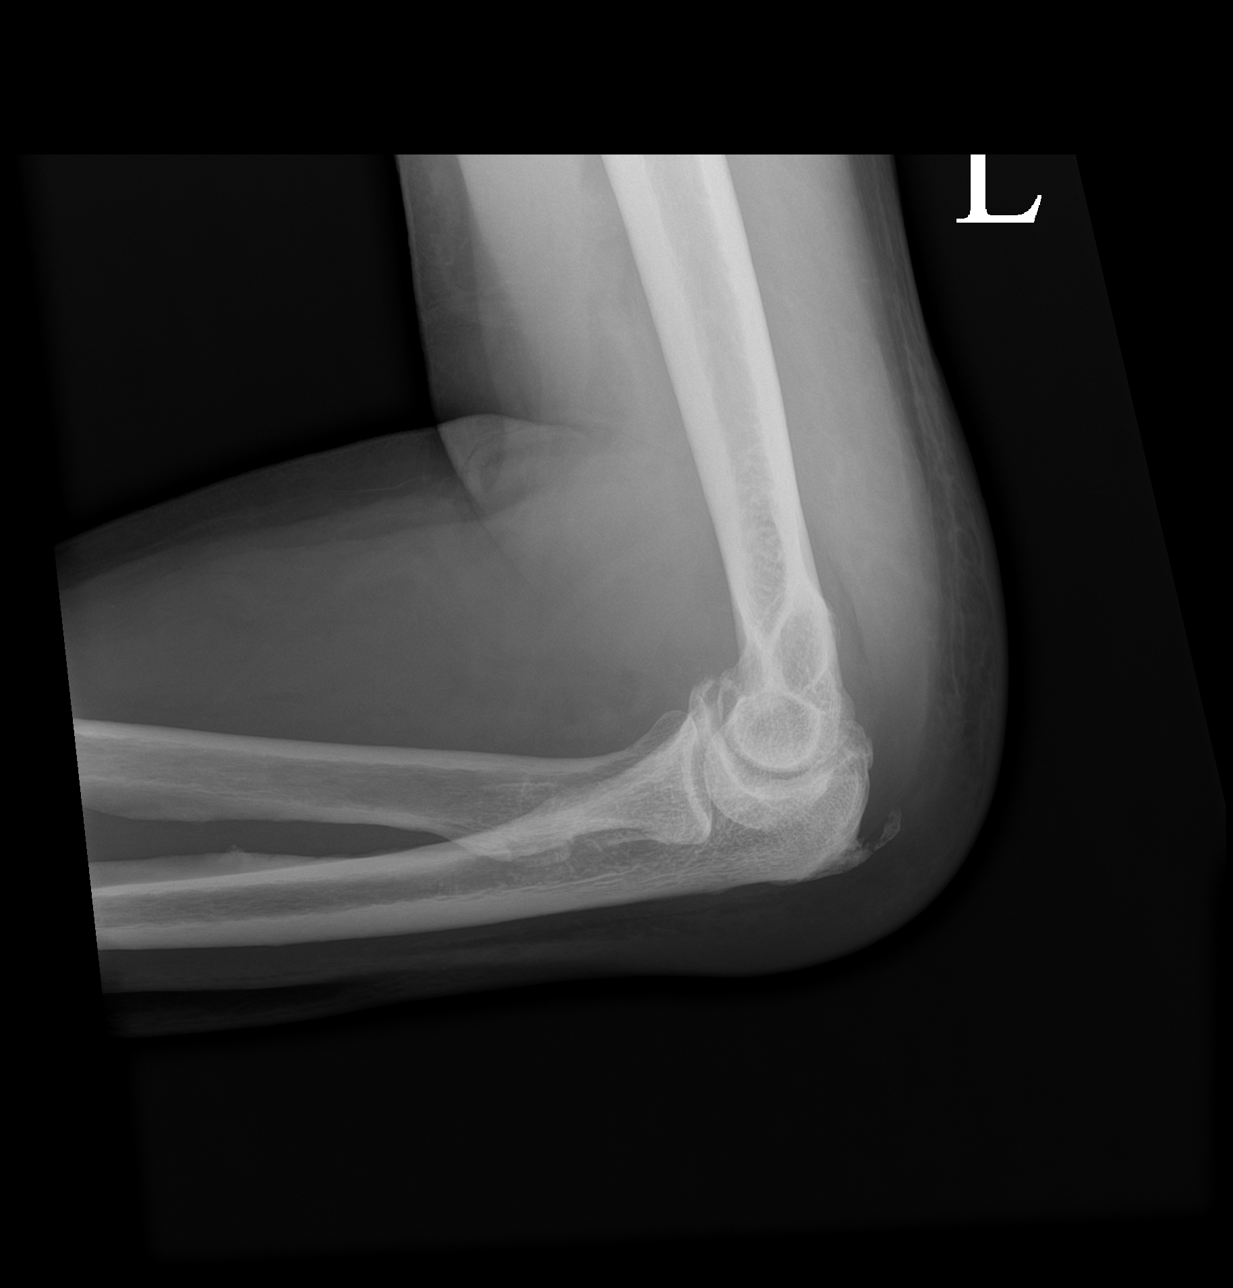

[4 of 4 positions shown; findings below may reference images not displayed]

FINDINGS: There is no evidence of fracture or dislocation. Question radial
neck cortical irregularity with no definite acute fracture. Joint
effusion. Degenerative changes and enthesophyte formation. Soft
tissues are unremarkable.
IMPRESSION: 1. Joint effusion in the setting of trauma suggestive of an occult
fracture.
2. Otherwise No acute displaced fracture or dislocation.

## 2022-04-17 ENCOUNTER — Ambulatory Visit: Payer: Medicare Other | Admitting: Internal Medicine

## 2022-05-02 ENCOUNTER — Ambulatory Visit (INDEPENDENT_AMBULATORY_CARE_PROVIDER_SITE_OTHER): Payer: Medicare Other | Admitting: Internal Medicine

## 2022-05-02 ENCOUNTER — Encounter: Payer: Self-pay | Admitting: Internal Medicine

## 2022-05-02 VITALS — BP 118/68 | HR 68 | Temp 98.1°F | Ht 72.0 in | Wt 163.0 lb

## 2022-05-02 DIAGNOSIS — N184 Chronic kidney disease, stage 4 (severe): Secondary | ICD-10-CM | POA: Diagnosis not present

## 2022-05-02 DIAGNOSIS — E1151 Type 2 diabetes mellitus with diabetic peripheral angiopathy without gangrene: Secondary | ICD-10-CM

## 2022-05-02 DIAGNOSIS — I10 Essential (primary) hypertension: Secondary | ICD-10-CM

## 2022-05-02 DIAGNOSIS — R634 Abnormal weight loss: Secondary | ICD-10-CM | POA: Diagnosis not present

## 2022-05-02 DIAGNOSIS — E1159 Type 2 diabetes mellitus with other circulatory complications: Secondary | ICD-10-CM

## 2022-05-02 LAB — HEMOGLOBIN A1C: Hgb A1c MFr Bld: 5.5 % (ref 4.6–6.5)

## 2022-05-02 LAB — COMPREHENSIVE METABOLIC PANEL
ALT: 17 U/L (ref 0–53)
AST: 23 U/L (ref 0–37)
Albumin: 4.4 g/dL (ref 3.5–5.2)
Alkaline Phosphatase: 123 U/L — ABNORMAL HIGH (ref 39–117)
BUN: 42 mg/dL — ABNORMAL HIGH (ref 6–23)
CO2: 30 mEq/L (ref 19–32)
Calcium: 9.7 mg/dL (ref 8.4–10.5)
Chloride: 106 mEq/L (ref 96–112)
Creatinine, Ser: 2.41 mg/dL — ABNORMAL HIGH (ref 0.40–1.50)
GFR: 24.64 mL/min — ABNORMAL LOW (ref >60.00)
Glucose, Bld: 107 mg/dL — ABNORMAL HIGH (ref 70–99)
Potassium: 4.4 mEq/L (ref 3.5–5.1)
Sodium: 145 mEq/L (ref 135–145)
Total Bilirubin: 0.7 mg/dL (ref 0.2–1.2)
Total Protein: 7.1 g/dL (ref 6.0–8.3)

## 2022-05-02 LAB — CBC WITH DIFFERENTIAL/PLATELET
Basophils Absolute: 0 10*3/uL (ref 0.0–0.1)
Basophils Relative: 0.7 % (ref 0.0–3.0)
Eosinophils Absolute: 0.3 10*3/uL (ref 0.0–0.7)
Eosinophils Relative: 5.3 % — ABNORMAL HIGH (ref 0.0–5.0)
HCT: 33.5 % — ABNORMAL LOW (ref 39.0–52.0)
Hemoglobin: 11.5 g/dL — ABNORMAL LOW (ref 13.0–17.0)
Lymphocytes Relative: 23.9 % (ref 12.0–46.0)
Lymphs Abs: 1.4 10*3/uL (ref 0.7–4.0)
MCHC: 34.2 g/dL (ref 30.0–36.0)
MCV: 84.6 fl (ref 78.0–100.0)
Monocytes Absolute: 0.8 10*3/uL (ref 0.1–1.0)
Monocytes Relative: 12.7 % — ABNORMAL HIGH (ref 3.0–12.0)
Neutro Abs: 3.5 10*3/uL (ref 1.4–7.7)
Neutrophils Relative %: 57.4 % (ref 43.0–77.0)
Platelets: 198 10*3/uL (ref 150.0–400.0)
RBC: 3.96 Mil/uL — ABNORMAL LOW (ref 4.22–5.81)
RDW: 17.4 % — ABNORMAL HIGH (ref 11.5–15.5)
WBC: 6.1 10*3/uL (ref 4.0–10.5)

## 2022-05-02 LAB — TSH: TSH: 0.99 u[IU]/mL (ref 0.35–5.50)

## 2022-05-02 MED ORDER — ALLOPURINOL 100 MG PO TABS: 50.0000 mg | ORAL_TABLET | Freq: Every day | ORAL | 1 refills | Status: DC

## 2022-05-02 NOTE — Assessment & Plan Note (Signed)
Reduce Allopurinol to 50 mg/d

## 2022-05-02 NOTE — Assessment & Plan Note (Signed)
Check A1c 

## 2022-05-02 NOTE — Assessment & Plan Note (Signed)
On Amlodipine 

## 2022-05-02 NOTE — Assessment & Plan Note (Signed)
Wt Readings from Last 3 Encounters:  05/02/22 163 lb (73.9 kg)  03/28/22 162 lb 2 oz (73.5 kg)  01/28/22 175 lb (79.4 kg)

## 2022-05-02 NOTE — Progress Notes (Signed)
Subjective:  Patient ID: [name], male    DOB: 03-28-1941  Age: 82 y.o. MRN: [id number]  CC: No chief complaint on file.   HPI Braydon Kullman Thaker presents for wax in the ears, spot on the belly button F/u HTN, CHF, gout  Outpatient Medications Prior to Visit  Medication Sig Dispense Refill   amLODipine (NORVASC) 10 MG tablet Take 0.5 tablets (5 mg total) by mouth daily. 90 tablet 1   aspirin 81 MG EC tablet Take 1 tablet (81 mg total) by mouth daily. 100 tablet 3   B Complex-Folic Acid (B COMPLEX PLUS) TABS Take 1 tablet by mouth daily. 100 tablet 3   Blood Glucose Monitoring Suppl (ONETOUCH VERIO FLEX SYSTEM) w/Device KIT USE TO MONITOR BLOOD SUGAR 1 kit 0   carvedilol (COREG) 25 MG tablet Take 1 tablet (25 mg total) by mouth 2 (two) times daily with a meal. 180 tablet 3   Cholecalciferol (VITAMIN D3) 50 MCG (2000 UT) capsule Take 1 capsule (2,000 Units total) by mouth daily. 100 capsule 3   colchicine 0.6 MG tablet Take 1 tablet (0.6 mg total) by mouth 2 (two) times daily. 180 tablet 3   cyclobenzaprine (FLEXERIL) 10 MG tablet Take 0.5 tablets (5 mg total) by mouth 2 (two) times daily as needed for muscle spasms. 10 tablet 0   ferrous sulfate 325 (65 FE) MG tablet Take 325 mg by mouth at bedtime.     glucose blood (ONETOUCH VERIO) test strip CHECK BLOOD SUGAR 1 TO 2  TIMES DAILY AS DIRECTED 200 strip 3   HYDROcodone-acetaminophen (NORCO) 5-325 MG tablet Take 0.5-1 tablets by mouth every 6 (six) hours as needed for severe pain. 5 tablet 0   Lancets (ONETOUCH DELICA PLUS [id number]) MISC USE AS DIRECTED 200 each 3   omeprazole (PRILOSEC) 40 MG capsule Take 1 capsule (40 mg total) by mouth daily. 90 capsule 3   pravastatin (PRAVACHOL) 80 MG tablet Take 1 tablet (80 mg total) by mouth at bedtime. 90 tablet 3   Prednisol Ace-Moxiflox-Bromfen 1-0.5-0.075 % SUSP Place 1 drop into the right eye 4 (four) times daily.     predniSONE (DELTASONE) 10 MG tablet 3 tabs by mouth per day for 3  days,2tabs per day for 3 days,1tab per day for 3 days 18 tablet 0   repaglinide (PRANDIN) 1 MG tablet 1 po before or with dinner (Patient taking differently: Take 1 mg by mouth daily with supper.) 90 tablet 3   torsemide (DEMADEX) 100 MG tablet TAKE 1 TABLET BY MOUTH DAILY (Patient taking differently: Take 100 mg by mouth daily.) 90 tablet 3   allopurinol (ZYLOPRIM) 100 MG tablet Take 1 tablet (100 mg total) by mouth daily. 90 tablet 3   insulin detemir (LEVEMIR) 100 UNIT/ML injection Inject 0.1 mLs (10 Units total) into the skin daily for 7 days. 0.7 mL 0   methylPREDNISolone acetate (DEPO-MEDROL) injection 80 mg      No facility-administered medications prior to visit.    ROS: Review of Systems  Constitutional:  Negative for appetite change, fatigue and unexpected weight change.  HENT:  Negative for congestion, nosebleeds, sneezing, sore throat and trouble swallowing.   Eyes:  Negative for itching and visual disturbance.  Respiratory:  Negative for cough.   Cardiovascular:  Negative for chest pain, palpitations and leg swelling.  Gastrointestinal:  Negative for abdominal distention, blood in stool, diarrhea and nausea.  Genitourinary:  Negative for frequency and hematuria.  Musculoskeletal:  Positive for gait problem. Negative  for back pain, joint swelling and neck pain.  Skin:  Negative for rash.  Neurological:  Negative for dizziness, tremors, speech difficulty and weakness.  Psychiatric/Behavioral:  Negative for agitation, dysphoric mood and sleep disturbance. The patient is not nervous/anxious.     Objective:  BP 118/68 (BP Location: Left Arm, Patient Position: Sitting, Cuff Size: Normal)   Pulse 68   Temp 98.1 F (36.7 C) (Oral)   Ht 6' (1.829 m)   Wt 163 lb (73.9 kg)   SpO2 94%   BMI 22.11 kg/m   BP Readings from Last 3 Encounters:  05/02/22 118/68  03/28/22 128/66  01/31/22 131/79    Wt Readings from Last 3 Encounters:  05/02/22 163 lb (73.9 kg)  03/28/22 162 lb 2  oz (73.5 kg)  01/28/22 175 lb (79.4 kg)    Physical Exam Constitutional:      General: He is not in acute distress.    Appearance: Normal appearance. He is well-developed. He is obese.     Comments: NAD  Eyes:     Conjunctiva/sclera: Conjunctivae normal.     Pupils: Pupils are equal, round, and reactive to light.  Neck:     Thyroid: No thyromegaly.     Vascular: No JVD.  Cardiovascular:     Rate and Rhythm: Normal rate and regular rhythm.     Heart sounds: Normal heart sounds. No murmur heard.    No friction rub. No gallop.  Pulmonary:     Effort: Pulmonary effort is normal. No respiratory distress.     Breath sounds: Normal breath sounds. No wheezing or rales.  Chest:     Chest wall: No tenderness.  Abdominal:     General: Bowel sounds are normal. There is no distension.     Palpations: Abdomen is soft. There is no mass.     Tenderness: There is no abdominal tenderness. There is no guarding or rebound.  Musculoskeletal:        General: No tenderness. Normal range of motion.     Cervical back: Normal range of motion.  Lymphadenopathy:     Cervical: No cervical adenopathy.  Skin:    General: Skin is warm and dry.     Findings: No rash.  Neurological:     Mental Status: He is alert and oriented to person, place, and time.     Cranial Nerves: No cranial nerve deficit.     Motor: No abnormal muscle tone.     Coordination: Coordination normal.     Gait: Gait normal.     Deep Tendon Reflexes: Reflexes are normal and symmetric.  Psychiatric:        Behavior: Behavior normal.        Thought Content: Thought content normal.        Judgment: Judgment normal.   Using a cane Wax removed SD in the umbilicus - removed   Lab Results  Component Value Date   WBC 16.5 (H) 01/28/2022   HGB 9.3 (L) 01/28/2022   HCT 27.1 (L) 01/28/2022   PLT 226 01/28/2022   GLUCOSE 192 (H) 01/28/2022   CHOL 120 07/21/2019   TRIG 68.0 07/21/2019   HDL 31.70 (L) 07/21/2019   LDLCALC 75  07/21/2019   ALT 13 01/27/2022   AST 21 01/27/2022   NA 137 01/28/2022   K 3.5 01/28/2022   CL 98 01/28/2022   CREATININE 2.80 (H) 01/28/2022   BUN 44 (H) 01/28/2022   CO2 25 01/28/2022   TSH 0.48 05/03/2021  PSA 8.92 (H) 04/22/2019   INR 1.35 07/23/2015   HGBA1C 5.5 01/28/2022   MICROALBUR 38.7 (H) 11/24/2013    VAS [place] LOWER EXTREMITY VENOUS (DVT) (ONLY [facility] & [facility])  Result Date: 01/26/2022  Lower Venous DVT Study Patient Name:  JARRYN ALTLAND  Date of Exam:   01/26/2022 Medical Rec #: [id number]        Accession #:    [id number] Date of Birth: [birth year]         Patient Gender: M Patient Age:   29 years Exam Location:  [facility] Procedure:      VAS [place] LOWER EXTREMITY VENOUS (DVT) Referring Phys: COOPER ROBBINS --------------------------------------------------------------------------------  Indications: Edema.  Comparison Study: No prior study Performing Technologist: Maudry Mayhew MHA, RDMS, RVT, RDCS  Examination Guidelines: A complete evaluation includes B-mode imaging, spectral Doppler, color Doppler, and power Doppler as needed of all accessible portions of each vessel. Bilateral testing is considered an integral part of a complete examination. Limited examinations for reoccurring indications may be performed as noted. The reflux portion of the exam is performed with the patient in reverse Trendelenburg.  +---------+---------------+---------+-----------+----------+--------------+ RIGHT    CompressibilityPhasicitySpontaneityPropertiesThrombus Aging +---------+---------------+---------+-----------+----------+--------------+ CFV      Full           Yes      Yes                                 +---------+---------------+---------+-----------+----------+--------------+ SFJ      Full                                                        +---------+---------------+---------+-----------+----------+--------------+ FV Prox  Full                                                         +---------+---------------+---------+-----------+----------+--------------+ FV Mid   Full                                                        +---------+---------------+---------+-----------+----------+--------------+ FV DistalFull                                                        +---------+---------------+---------+-----------+----------+--------------+ PFV      Full                                                        +---------+---------------+---------+-----------+----------+--------------+ POP      Full           Yes      Yes                                 +---------+---------------+---------+-----------+----------+--------------+  PTV      Full                                                        +---------+---------------+---------+-----------+----------+--------------+ PERO     Full                                                        +---------+---------------+---------+-----------+----------+--------------+   +---------+---------------+---------+-----------+----------+--------------+ LEFT     CompressibilityPhasicitySpontaneityPropertiesThrombus Aging +---------+---------------+---------+-----------+----------+--------------+ CFV      Full           Yes      Yes                                 +---------+---------------+---------+-----------+----------+--------------+ SFJ      Full                                                        +---------+---------------+---------+-----------+----------+--------------+ FV Prox  Full                                                        +---------+---------------+---------+-----------+----------+--------------+ FV Mid   Full                                                        +---------+---------------+---------+-----------+----------+--------------+ FV DistalFull                                                         +---------+---------------+---------+-----------+----------+--------------+ PFV      Full                                                        +---------+---------------+---------+-----------+----------+--------------+ POP      Full           Yes      Yes                                 +---------+---------------+---------+-----------+----------+--------------+ PTV      Full                                                        +---------+---------------+---------+-----------+----------+--------------+  PERO     Full                                                        +---------+---------------+---------+-----------+----------+--------------+     Summary: RIGHT: - There is no evidence of deep vein thrombosis in the lower extremity.  - No cystic structure found in the popliteal fossa.  LEFT: - There is no evidence of deep vein thrombosis in the lower extremity.  - No cystic structure found in the popliteal fossa.  *See table(s) above for measurements and observations. Electronically signed by Servando Snare MD on 01/26/2022 at 5:28:02 PM.    Final    CT Chest Wo Contrast  Result Date: 01/26/2022 CLINICAL DATA:  82 year old male with history of chest pain radiating into the upper back. EXAM: CT CHEST WITHOUT CONTRAST TECHNIQUE: Multidetector CT imaging of the chest was performed following the standard protocol without IV contrast. RADIATION DOSE REDUCTION: This exam was performed according to the departmental dose-optimization program which includes automated exposure control, adjustment of the mA and/or kV according to patient size and/or use of iterative reconstruction technique. COMPARISON:  No priors. FINDINGS: Cardiovascular: Heart size is normal. There is no significant pericardial fluid, thickening or pericardial calcification. There is aortic atherosclerosis, as well as atherosclerosis of the great vessels of the mediastinum and the coronary arteries, including calcified  atherosclerotic plaque in the left main, left anterior descending, left circumflex and right coronary arteries. Status post median sternotomy for CABG including LIMA to the LAD. Mediastinum/Nodes: No pathologically enlarged mediastinal or hilar lymph nodes. Please note that accurate exclusion of hilar adenopathy is limited on noncontrast CT scans. Esophagus is unremarkable in appearance. No axillary lymphadenopathy. Lungs/Pleura: No acute consolidative airspace disease. No pleural effusions. Cluster of micro nodularity in the basal aspect of the right lower lobe, strongly favored to be benign areas of mucoid impaction within terminal bronchioles. No other larger more suspicious appearing pulmonary nodules or masses are noted. Diffuse bronchial wall thickening with mild centrilobular and paraseptal emphysema. Upper Abdomen: Aortic atherosclerosis. Calcified gallstones lying dependently in the gallbladder. Multiple low-attenuation lesions in the visualized liver, incompletely characterized on today's noncontrast CT examination, but statistically likely to represent cysts (no imaging follow-up is recommended). Incompletely imaged lesion in the upper pole of the right kidney which is complex in appearance with some faint internal calcifications. Additional 1.6 cm high attenuation lesion in the upper pole of the right kidney also noted. 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 Musculoskeletal: Median sternotomy wires. In the soft tissues of the upper anterior left chest wall adjacent to the first costochondral junction and inferior to the medial left clavicle (axial image 23 of series 3 and coronal image 59 of series 5) there is a partially calcified high density lesion measuring 3.1 x 2.4 x 1.9 cm. There are no aggressive appearing lytic or blastic lesions noted in the visualized portions of the skeleton.  IMPRESSION: 1. No acute findings in the thorax to account for the patient's symptoms. 2. Unusual partially calcified soft tissue lesion located between the undersurface of the medial left clavicle in the adjacent left first costochondral junction. This is of uncertain etiology and significance, but is favored to represent a benign area of heterotopic ossification. Given  the patient's advanced age, repeat noncontrast chest CT is suggested in 3-6 months to ensure the stability of this finding. 3. Aortic atherosclerosis, in addition to left main and three-vessel coronary artery disease. Status post median sternotomy for CABG including LIMA to the LAD. 4. Diffuse bronchial wall thickening with mild centrilobular and paraseptal emphysema; imaging findings suggestive of underlying COPD. 5. Cholelithiasis. 6. Additional incidental findings, as above. Aortic Atherosclerosis (ICD10-I70.0) and Emphysema (ICD10-J43.9). Electronically Signed   By: Vinnie Langton M.D.   On: 01/26/2022 09:30   DG Chest 2 View  Result Date: 01/25/2022 CLINICAL DATA:  Chest pain EXAM: CHEST - 2 VIEW COMPARISON:  05/15/2021 FINDINGS: Stable cardiomediastinal contours status post sternotomy and CABG. No focal airspace consolidation, pleural effusion, or pneumothorax. IMPRESSION: No active cardiopulmonary disease. Electronically Signed   By: Davina Poke D.O.   On: 01/25/2022 16:04    Assessment & Plan:   Problem List Items Addressed This Visit       Cardiovascular and Mediastinum   DM (diabetes mellitus), type 2 with peripheral vascular complications ([place]) - Primary    Check A1c      Relevant Orders   Comprehensive metabolic panel   Hemoglobin A1c   Essential hypertension    On Amlodipine        Endocrine   Type 2 diabetes mellitus (A1c 6.0 on 05/03/2021) with steroid-induced hyperglycemia    Check A1c      Relevant Orders   Comprehensive metabolic panel     Genitourinary   CKD (chronic kidney disease) stage 4,  GFR 15-29 ml/min (HCC)    Reduce Allopurinol to 50 mg/d      Relevant Orders   CBC with Differential/Platelet   TSH     Other   Weight loss    Wt Readings from Last 3 Encounters:  05/02/22 163 lb (73.9 kg)  03/28/22 162 lb 2 oz (73.5 kg)  01/28/22 175 lb (79.4 kg)        Relevant Orders   TSH      Meds ordered this encounter  Medications   allopurinol (ZYLOPRIM) 100 MG tablet    Sig: Take 0.5 tablets (50 mg total) by mouth daily.    Dispense:  90 tablet    Refill:  1      Follow-up: Return in about 3 months (around 08/01/2022) for a follow-up visit.  Walker Kehr, MD

## 2022-05-08 ENCOUNTER — Ambulatory Visit (INDEPENDENT_AMBULATORY_CARE_PROVIDER_SITE_OTHER): Payer: Medicare Other

## 2022-05-08 VITALS — Ht 72.0 in | Wt 163.0 lb

## 2022-05-08 DIAGNOSIS — Z0000 Encounter for general adult medical examination without abnormal findings: Secondary | ICD-10-CM

## 2022-05-08 NOTE — Patient Instructions (Signed)
Harold Pittman , Thank you for taking time to come for your Medicare Wellness Visit. I appreciate your ongoing commitment to your health goals. Please review the following plan we discussed and let me know if I can assist you in the future.   These are the goals we discussed:  Goals      Exercise 150 minutes per week (moderate activity)     Will consider getting a podometer from [facility] or drug store and track walking Try to increase 1000 steps per month may have access to walking tracks at ball fields  while watching the grand children         This is a list of the screening recommended for you and due dates:  Health Maintenance  Topic Date Due   DTaP/Tdap/Td vaccine (1 - Tdap) 11/25/2013   Yearly kidney health urinalysis for diabetes  11/25/2014   Complete foot exam   09/19/2017   COVID-19 Vaccine (5 - 2023-24 season) 03/09/2022   Hemoglobin A1C  10/31/2022   Eye exam for diabetics  03/21/2023   Yearly kidney function blood test for diabetes  05/03/2023   Medicare Annual Wellness Visit  05/09/2023   Pneumonia Vaccine  Completed   Flu Shot  Completed   HPV Vaccine  Aged Out   Zoster (Shingles) Vaccine  Discontinued    Advanced directives: yes  Conditions/risks identified: none  Next appointment: Follow up in one year for your annual wellness visit. 05/14/2023 '@11am'$ /telephone  Preventive Care 7 Years and Older, Male  Preventive care refers to lifestyle choices and visits with your health care provider that can promote health and wellness. What does preventive care include? A yearly physical exam. This is also called an annual well check. Dental exams once or twice a year. Routine eye exams. Ask your health care provider how often you should have your eyes checked. Personal lifestyle choices, including: Daily care of your teeth and gums. Regular physical activity. Eating a healthy diet. Avoiding tobacco and drug use. Limiting alcohol use. Practicing safe sex. Taking low  doses of aspirin every day. Taking vitamin and mineral supplements as recommended by your health care provider. What happens during an annual well check? The services and screenings done by your health care provider during your annual well check will depend on your age, overall health, lifestyle risk factors, and family history of disease. Counseling  Your health care provider may ask you questions about your: Alcohol use. Tobacco use. Drug use. Emotional well-being. Home and relationship well-being. Sexual activity. Eating habits. History of falls. Memory and ability to understand (cognition). Work and work Statistician. Screening  You may have the following tests or measurements: Height, weight, and BMI. Blood pressure. Lipid and cholesterol levels. These may be checked every 5 years, or more frequently if you are over 37 years old. Skin check. Lung cancer screening. You may have this screening every year starting at age 93 if you have a 30-pack-year history of smoking and currently smoke or have quit within the past 15 years. Fecal occult blood test (FOBT) of the stool. You may have this test every year starting at age 19. Flexible sigmoidoscopy or colonoscopy. You may have a sigmoidoscopy every 5 years or a colonoscopy every 10 years starting at age 35. Prostate cancer screening. Recommendations will vary depending on your family history and other risks. Hepatitis C blood test. Hepatitis B blood test. Sexually transmitted disease (STD) testing. Diabetes screening. This is done by checking your blood sugar (glucose) after you have  not eaten for a while (fasting). You may have this done every 1-3 years. Abdominal aortic aneurysm (AAA) screening. You may need this if you are a current or former smoker. Osteoporosis. You may be screened starting at age 24 if you are at high risk. Talk with your health care provider about your test results, treatment options, and if necessary, the need  for more tests. Vaccines  Your health care provider may recommend certain vaccines, such as: Influenza vaccine. This is recommended every year. Tetanus, diphtheria, and acellular pertussis (Tdap, Td) vaccine. You may need a Td booster every 10 years. Zoster vaccine. You may need this after age 35. Pneumococcal 13-valent conjugate (PCV13) vaccine. One dose is recommended after age 18. Pneumococcal polysaccharide (PPSV23) vaccine. One dose is recommended after age 48. Talk to your health care provider about which screenings and vaccines you need and how often you need them. This information is not intended to replace advice given to you by your health care provider. Make sure you discuss any questions you have with your health care provider. Document Released: 04/22/2015 Document Revised: 12/14/2015 Document Reviewed: 01/25/2015 Elsevier Interactive Patient Education  2017 [employer] Prevention in the Home Falls can cause injuries. They can happen to people of all ages. There are many things you can do to make your home safe and to help prevent falls. What can I do on the outside of my home? Regularly fix the edges of walkways and driveways and fix any cracks. Remove anything that might make you trip as you walk through a door, such as a raised step or threshold. Trim any bushes or trees on the path to your home. Use bright outdoor lighting. Clear any walking paths of anything that might make someone trip, such as rocks or tools. Regularly check to see if handrails are loose or broken. Make sure that both sides of any steps have handrails. Any raised decks and porches should have guardrails on the edges. Have any leaves, snow, or ice cleared regularly. Use sand or salt on walking paths during winter. Clean up any spills in your garage right away. This includes oil or grease spills. What can I do in the bathroom? Use night lights. Install grab bars by the toilet and in the tub and  shower. Do not use towel bars as grab bars. Use non-skid mats or decals in the tub or shower. If you need to sit down in the shower, use a plastic, non-slip stool. Keep the floor dry. Clean up any water that spills on the floor as soon as it happens. Remove soap buildup in the tub or shower regularly. Attach bath mats securely with double-sided non-slip rug tape. Do not have throw rugs and other things on the floor that can make you trip. What can I do in the bedroom? Use night lights. Make sure that you have a light by your bed that is easy to reach. Do not use any sheets or blankets that are too big for your bed. They should not hang down onto the floor. Have a firm chair that has side arms. You can use this for support while you get dressed. Do not have throw rugs and other things on the floor that can make you trip. What can I do in the kitchen? Clean up any spills right away. Avoid walking on wet floors. Keep items that you use a lot in easy-to-reach places. If you need to reach something above you, use a strong step stool  that has a grab bar. Keep electrical cords out of the way. Do not use floor polish or wax that makes floors slippery. If you must use wax, use non-skid floor wax. Do not have throw rugs and other things on the floor that can make you trip. What can I do with my stairs? Do not leave any items on the stairs. Make sure that there are handrails on both sides of the stairs and use them. Fix handrails that are broken or loose. Make sure that handrails are as long as the stairways. Check any carpeting to make sure that it is firmly attached to the stairs. Fix any carpet that is loose or worn. Avoid having throw rugs at the top or bottom of the stairs. If you do have throw rugs, attach them to the floor with carpet tape. Make sure that you have a light switch at the top of the stairs and the bottom of the stairs. If you do not have them, ask someone to add them for  you. What else can I do to help prevent falls? Wear shoes that: Do not have high heels. Have rubber bottoms. Are comfortable and fit you well. Are closed at the toe. Do not wear sandals. If you use a stepladder: Make sure that it is fully opened. Do not climb a closed stepladder. Make sure that both sides of the stepladder are locked into place. Ask someone to hold it for you, if possible. Clearly mark and make sure that you can see: Any grab bars or handrails. First and last steps. Where the edge of each step is. Use tools that help you move around (mobility aids) if they are needed. These include: Canes. Walkers. Scooters. Crutches. Turn on the lights when you go into a dark area. Replace any light bulbs as soon as they burn out. Set up your furniture so you have a clear path. Avoid moving your furniture around. If any of your floors are uneven, fix them. If there are any pets around you, be aware of where they are. Review your medicines with your doctor. Some medicines can make you feel dizzy. This can increase your chance of falling. Ask your doctor what other things that you can do to help prevent falls. This information is not intended to replace advice given to you by your health care provider. Make sure you discuss any questions you have with your health care provider. Document Released: 01/20/2009 Document Revised: 09/01/2015 Document Reviewed: 04/30/2014 Elsevier Interactive Patient Education  2017 [employer].

## 2022-05-08 NOTE — Progress Notes (Addendum)
Virtual Visit via Telephone Note  I connected with  Harold Pittman on 05/08/22 at 11:00 AM EST by telephone and verified that I am speaking with the correct person using two identifiers.  Location: Patient: home Provider: [facility] Persons participating in the virtual visit: patient/Nurse Health Advisor   I discussed the limitations, risks, security and privacy concerns of performing an evaluation and management service by telephone and the availability of in person appointments. The patient expressed understanding and agreed to proceed.  Interactive audio and video telecommunications were attempted between this nurse and patient, however failed, due to patient having technical difficulties OR patient did not have access to video capability.  We continued and completed visit with audio only.  Some vital signs may be absent or patient reported.   Roger Shelter, LPN  Subjective:   Harold Pittman is a 82 y.o. male who presents for Medicare Annual/Subsequent preventive examination.  Review of Systems    Cardiac Risk Factors include: advanced age (>36mn, >>63women);diabetes mellitus;dyslipidemia;male gender;sedentary lifestyle     Objective:    Today's Vitals   05/08/22 1100  Weight: 163 lb (73.9 kg)  Height: 6' (1.829 m)   Body mass index is 22.11 kg/m.     05/08/2022   11:17 AM 01/27/2022    9:30 PM 05/23/2021    9:39 AM 05/17/2021   12:46 AM 11/29/2020    2:33 PM 09/16/2019    9:20 AM 01/10/2019    2:04 AM  Advanced Directives  Does Patient Have a Medical Advance Directive? Yes No Yes Yes No No   Type of AParamedicof ABethesdaLiving will   HThayer    Does patient want to make changes to medical advance directive?    No - Patient declined     Copy of [place] Chart? No - copy requested   No - copy requested     Would patient like information on creating a medical advance directive?  No - Patient  declined    Yes (MAU/Ambulatory/Procedural Areas - Information given) No - Patient declined    Current Medications (verified) Outpatient Encounter Medications as of 05/08/2022  Medication Sig   allopurinol (ZYLOPRIM) 100 MG tablet Take 0.5 tablets (50 mg total) by mouth daily.   amLODipine (NORVASC) 10 MG tablet Take 0.5 tablets (5 mg total) by mouth daily.   aspirin 81 MG EC tablet Take 1 tablet (81 mg total) by mouth daily.   B Complex-Folic Acid (B COMPLEX PLUS) TABS Take 1 tablet by mouth daily.   Blood Glucose Monitoring Suppl (ONETOUCH VERIO FLEX SYSTEM) w/Device KIT USE TO MONITOR BLOOD SUGAR   carvedilol (COREG) 25 MG tablet Take 1 tablet (25 mg total) by mouth 2 (two) times daily with a meal.   Cholecalciferol (VITAMIN D3) 50 MCG (2000 UT) capsule Take 1 capsule (2,000 Units total) by mouth daily.   colchicine 0.6 MG tablet Take 1 tablet (0.6 mg total) by mouth 2 (two) times daily.   cyclobenzaprine (FLEXERIL) 10 MG tablet Take 0.5 tablets (5 mg total) by mouth 2 (two) times daily as needed for muscle spasms.   ferrous sulfate 325 (65 FE) MG tablet Take 325 mg by mouth at bedtime.   glucose blood (ONETOUCH VERIO) test strip CHECK BLOOD SUGAR 1 TO 2  TIMES DAILY AS DIRECTED   HYDROcodone-acetaminophen (NORCO) 5-325 MG tablet Take 0.5-1 tablets by mouth every 6 (six) hours as needed for severe pain.  Lancets (ONETOUCH DELICA PLUS [id number]) MISC USE AS DIRECTED   omeprazole (PRILOSEC) 40 MG capsule Take 1 capsule (40 mg total) by mouth daily.   pravastatin (PRAVACHOL) 80 MG tablet Take 1 tablet (80 mg total) by mouth at bedtime.   Prednisol Ace-Moxiflox-Bromfen 1-0.5-0.075 % SUSP Place 1 drop into the right eye 4 (four) times daily.   predniSONE (DELTASONE) 10 MG tablet 3 tabs by mouth per day for 3 days,2tabs per day for 3 days,1tab per day for 3 days   repaglinide (PRANDIN) 1 MG tablet 1 po before or with dinner (Patient taking differently: Take 1 mg by mouth daily with supper.)    torsemide (DEMADEX) 100 MG tablet TAKE 1 TABLET BY MOUTH DAILY (Patient taking differently: Take 100 mg by mouth daily.)   insulin detemir (LEVEMIR) 100 UNIT/ML injection Inject 0.1 mLs (10 Units total) into the skin daily for 7 days.   No facility-administered encounter medications on file as of 05/08/2022.    Allergies (verified) Benazepril and Gabapentin   History: Past Medical History:  Diagnosis Date   Anemia    low iron   Anxiety state, unspecified    Blood in stool    Chronic airway obstruction, not elsewhere classified    Coronary artery disease    Coronary atherosclerosis of artery bypass graft    Dyspnea    ED (erectile dysfunction)    Esophageal reflux    Gout, unspecified    Osteoarthrosis, unspecified whether generalized or localized, unspecified site    Other and unspecified hyperlipidemia    Other specified disorder of rectum and anus    Personal history of tobacco use, presenting hazards to health    Sleep apnea    does not use cpap   Type II or unspecified type diabetes mellitus without mention of complication, not stated as uncontrolled    Unspecified essential hypertension    Past Surgical History:  Procedure Laterality Date   BIOPSY  09/26/2017   Procedure: BIOPSY;  Surgeon: Ladene Artist, MD;  Location: [facility];  Service: Endoscopy;;   BIOPSY  02/03/2018   Procedure: BIOPSY;  Surgeon: Irving Copas., MD;  Location: [facility] ENDOSCOPY;  Service: Gastroenterology;;   COLONOSCOPY WITH PROPOFOL N/A 09/26/2017   Procedure: COLONOSCOPY WITH PROPOFOL;  Surgeon: Ladene Artist, MD;  Location: [facility] ENDOSCOPY;  Service: Endoscopy;  Laterality: N/A;   CORONARY ARTERY BYPASS GRAFT  57   LIMA to LAD, sequential saphenous vein graft to the first and second diagonal, sequential saphenous vein graft to the intermediate OM and circumflex and SVG to RCA   ESOPHAGOGASTRODUODENOSCOPY (EGD) WITH PROPOFOL N/A 09/26/2017   Procedure: ESOPHAGOGASTRODUODENOSCOPY (EGD)  WITH PROPOFOL;  Surgeon: Ladene Artist, MD;  Location: [facility] END[facility]COPY;  Service: Endoscopy;  Laterality: N/A;   ESOPHAGOGASTRODUODENOSCOPY (EGD) WITH PROPOFOL N/A 02/03/2018   Procedure: ESOPHAGOGASTRODUODENOSCOPY (EGD);  Surgeon: Irving Copas., MD;  Location: [place];  Service: Gastroenterology;  Laterality: N/A;   KNEE SURGERY     BILATERAL   POLYPECTOMY  09/26/2017   Procedure: POLYPECTOMY;  Surgeon: Ladene Artist, MD;  Location: [facility] ENDOSCOPY;  Service: Endoscopy;;   [place] INJECTION  02/03/2018   Procedure: SUBMUCOSAL INJECTION;  Surgeon: Irving Copas., MD;  Location: [facility] ENDOSCOPY;  Service: Gastroenterology;;   Family History  Problem Relation Age of Onset   Heart disease Mother 49       CAD   Mental illness Father        Alzheimer's  Depression Sister    Heart disease Sister    Stroke Son    Hypertension Other    Coronary artery disease Other        Male 1st degree relative <50   Colon cancer Neg Hx    Stomach cancer Neg Hx    Esophageal cancer Neg Hx    Liver disease Neg Hx    Pancreatic cancer Neg Hx    Rectal cancer Neg Hx    Social History   Socioeconomic History   Marital status: Widowed    Spouse name: Not on file   Number of children: Not on file   Years of education: Not on file   Highest education level: Not on file  Occupational History   Occupation: RETIRED    Comment: Journalist, newspaper - welder  Tobacco Use   Smoking status: Former   Smokeless tobacco: Never   Tobacco comments:    Stopped 1997  Vaping Use   Vaping Use: Never used  Substance and Sexual Activity   Alcohol use: No    Comment: Stopped 1997   Drug use: No   Sexual activity: Not Currently  Other Topics Concern   Not on file  Social History Narrative   Patient does not get regular exercise-does sit exercises Tuesday one hr   Daily Caffeine use - 6      Wife decease 06/23/2005;       Social Determinants of Health   Financial  Resource Strain: Low Risk  (05/08/2022)   Overall Financial Resource Strain (CARDIA)    Difficulty of Paying Living Expenses: Not hard at all  Food Insecurity: No Food Insecurity (05/08/2022)   Hunger Vital Sign    Worried About Running Out of Food in the Last Year: Never true    Ran Out of Food in the Last Year: Never true  Transportation Needs: No Transportation Needs (05/08/2022)   PRAPARE - Hydrologist (Medical): No    Lack of Transportation (Non-Medical): No  Physical Activity: Inactive (05/08/2022)   Exercise Vital Sign    Days of Exercise per Week: 0 days    Minutes of Exercise per Session: 0 min  Stress: No Stress Concern Present (05/08/2022)   [place]    Feeling of Stress : Not at all  Social Connections: Moderately Isolated (05/08/2022)   Social Connection and Isolation Panel [NHANES]    Frequency of Communication with Friends and Family: More than three times a week    Frequency of Social Gatherings with Friends and Family: More than three times a week    Attends Religious Services: More than 4 times per year    Active Member of [employer] or Organizations: No    Attends Archivist Meetings: Never    Marital Status: Widowed    Tobacco Counseling Counseling given: Not Answered Tobacco comments: Stopped 1997   Clinical Intake:  Pre-visit preparation completed: Yes  Pain : No/denies pain     BMI - recorded: 22.11 Nutritional Risks: None Diabetes: Yes CBG done?: No Did pt. bring in CBG monitor from home?: No (televisit)  How often do you need to have someone help you when you read instructions, pamphlets, or other written materials from your doctor or pharmacy?: 1 - Never  Diabetic?yes  Information entered by :: B.Desiray Orchard,LPN  Activities of Daily Living    05/08/2022   11:19 AM 01/27/2022    9:30 PM  In your present state  of health, do you have any  difficulty performing the following activities:  Hearing? 1 0  Vision? 0 0  Difficulty concentrating or making decisions? 0 0  Walking or climbing stairs? 0 1  Comment has cane assist   Dressing or bathing? 0 0  Doing errands, shopping? 0 0  Comment daughter and son accompaniesd to all visits   Preparing Food and eating ? N   Comment can orepare some:children provide most all meals   Using the Toilet? N   In the past six months, have you accidently leaked urine? N   Do you have problems with loss of bowel control? N   Managing your Medications? Y   Comment daughter does in pill box for the week   Managing your Finances? N   Housekeeping or managing your Housekeeping? Y   Comment children help;can do but they help     Patient Care Team: Plotnikov, Evie Lacks, MD as PCP - Cyndia Diver, MD as PCP - Cardiology (Cardiology) Michael Boston, MD as Consulting Physician (General Surgery) Mansouraty, Telford Nab., MD as Consulting Physician (Gastroenterology) Katy Fitch, Darlina Guys, MD as Consulting Physician (Ophthalmology) Szabat, Darnelle Maffucci, [facility] (Inactive) as Pharmacist (Pharmacist)  Indicate any recent Medical Services you may have received from other than [facility] providers in the past year (date may be approximate).     Assessment:   This is a routine wellness examination for Harold Pittman.  Hearing/Vision screen Hearing Screening - Comments:: Left ear diminished hearing;wants hearing test Vision Screening - Comments:: Reading glasses;due for eye exam-last at [facility] issues and exercise activities discussed: Current Exercise Habits: The patient does not participate in regular exercise at present, Exercise limited by: None identified   Goals Addressed   None    Depression Screen    05/08/2022   11:10 AM 05/02/2022   10:29 AM 03/28/2022    2:32 PM 03/28/2022    1:59 PM 10/16/2021   11:47 AM 10/12/2020    8:22 AM 09/16/2019    9:20 AM  PHQ 2/9 Scores  PHQ - 2 Score 0 0 0  0 0 0 0  PHQ- 9 Score 0 0 7   1     Fall Risk    05/08/2022   11:05 AM 05/02/2022   10:29 AM 03/28/2022    2:31 PM 03/28/2022    1:59 PM 10/16/2021   11:47 AM  Fall Risk   Falls in the past year? 1 0 1 1 0  Comment trip last week over divider strip in kitchen      Number falls in past yr: 0 0 0 0 0  Injury with Fall? 0 0 0 0 0  Risk for fall due to : Other (Comment) Impaired balance/gait;Impaired mobility History of fall(s) History of fall(s) Impaired balance/gait;Impaired mobility  Risk for fall due to: Comment   walker walker   Follow up Education provided;Falls prevention discussed Falls evaluation completed Falls evaluation completed Falls evaluation completed Falls evaluation completed    FALL RISK PREVENTION PERTAINING TO THE HOME:  Any stairs in or around the home? No  If so, are there any without handrails? No  Home free of loose throw rugs in walkways, pet beds, electrical cords, etc? Yes  Adequate lighting in your home to reduce risk of falls? Yes   ASSISTIVE DEVICES UTILIZED TO PREVENT FALLS:  Life alert? No  Use of a cane, walker or w/c? Yes cane Grab bars in the bathroom? Yes  Shower chair or bench in  shower? Yes  Elevated toilet seat or a handicapped toilet? Yes but does not use  Cognitive Function:        05/08/2022   11:19 AM 09/16/2019    9:23 AM  6CIT Screen  What Year? 0 points 0 points  What month? 0 points 0 points  What time? 0 points 0 points  Count back from 20 0 points 0 points  Months in reverse 4 points 0 points  Repeat phrase 2 points 4 points  Total Score 6 points 4 points    Immunizations Immunization History  Administered Date(s) Administered   Fluad Quad(high Dose 65+) 01/27/2020, 05/03/2021   Influenza Split 03/15/2011, 02/08/2012   Influenza Whole 03/04/2006, 03/04/2007, 03/15/2009, 12/14/2009   Influenza, High Dose Seasonal PF 03/16/2013, 12/14/2014, 01/30/2016, 12/31/2016, 01/08/2018, 01/06/2019, 01/02/2022   Influenza,inj,Quad  PF,6+ Mos 12/17/2013   PFIZER Comirnaty(Gray Top)Covid-19 Tri-Sucrose Vaccine 01/12/2022   PFIZER(Purple Top)SARS-COV-2 Vaccination 05/14/2019, 06/04/2019, 01/27/2020   Pneumococcal Conjugate-13 09/16/2013   Pneumococcal Polysaccharide-23 12/20/2004, 03/15/2011   Tetanus 11/24/2013    TDAP status: Up to date  Flu Vaccine status: Up to date  Pneumococcal vaccine status: Up to date  Covid-19 vaccine status: Completed vaccines  Qualifies for Shingles Vaccine? Yes   Zostavax completed No   Shingrix Completed?: No.    Education has been provided regarding the importance of this vaccine. Patient has been advised to call insurance company to determine out of pocket expense if they have not yet received this vaccine. Advised may also receive vaccine at local pharmacy or Health Dept. Verbalized acceptance and understanding.  Screening Tests Health Maintenance  Topic Date Due   DTaP/Tdap/Td (1 - Tdap) 11/25/2013   Diabetic kidney evaluation - Urine ACR  11/25/2014   FOOT EXAM  09/19/2017   COVID-19 Vaccine (5 - 2023-24 season) 03/09/2022   HEMOGLOBIN A1C  10/31/2022   OPHTHALMOLOGY EXAM  03/21/2023   Diabetic kidney evaluation - eGFR measurement  05/03/2023   Medicare Annual Wellness (AWV)  05/09/2023   Pneumonia Vaccine 14+ Years old  Completed   INFLUENZA VACCINE  Completed   HPV VACCINES  Aged Out   Zoster Vaccines- Shingrix  Discontinued    Health Maintenance  Health Maintenance Due  Topic Date Due   DTaP/Tdap/Td (1 - Tdap) 11/25/2013   Diabetic kidney evaluation - Urine ACR  11/25/2014   FOOT EXAM  09/19/2017   COVID-19 Vaccine (5 - 2023-24 season) 03/09/2022    Colorectal cancer screening: Type of screening: Colonoscopy. Completed no. Repeat every 0 years aged out  Lung Cancer Screening: (Low Dose CT Chest recommended if Age 76-80 years, 30 pack-year currently smoking OR have quit w/in 15years.) does not qualify.   Lung Cancer Screening Referral: no  Additional  Screening:  Hepatitis C Screening: does not qualify; Completed no  Vision Screening: Recommended annual ophthalmology exams for early detection of glaucoma and other disorders of the eye. Is the patient up to date with their annual eye exam?  No  Who is the provider or what is the name of the office in which the patient attends annual eye exams? [facility] If pt is not established with a provider, would they like to be referred to a provider to establish care? No .   Dental Screening: Recommended annual dental exams for proper oral hygiene  Community Resource Referral / Chronic Care Management: CRR required this visit?  No   CCM required this visit?  No      Plan:     I have personally reviewed  and noted the following in the patient's chart:   Medical and social history Use of alcohol, tobacco or illicit drugs  Current medications and supplements including opioid prescriptions. Patient is currently taking opioid prescriptions. Information provided to patient regarding non-opioid alternatives. Patient advised to discuss non-opioid treatment plan with their provider. Functional ability and status Nutritional status Physical activity Advanced directives List of other physicians Hospitalizations, surgeries, and ER visits in previous 12 months Vitals Screenings to include cognitive, depression, and falls Referrals and appointments  In addition, I have reviewed and discussed with patient certain preventive protocols, quality metrics, and best practice recommendations. A written personalized care plan for preventive services as well as general preventive health recommendations were provided to patient.     Roger Shelter, LPN   [id number]/3568   Nurse Notes: none   Medical screening examination/treatment/procedure(s) were performed by non-physician practitioner and as supervising physician I was immediately available for consultation/collaboration.  I agree with above. Lew Dawes, MD

## 2022-05-11 DIAGNOSIS — H25811 Combined forms of age-related cataract, right eye: Secondary | ICD-10-CM | POA: Diagnosis not present

## 2022-05-18 ENCOUNTER — Other Ambulatory Visit: Payer: Self-pay | Admitting: Internal Medicine

## 2022-05-23 DIAGNOSIS — H2512 Age-related nuclear cataract, left eye: Secondary | ICD-10-CM | POA: Diagnosis not present

## 2022-05-25 DIAGNOSIS — H25812 Combined forms of age-related cataract, left eye: Secondary | ICD-10-CM | POA: Diagnosis not present

## 2022-06-09 IMAGING — US US RENAL
1 series · 13 of 25 positions shown · non-contrast
Comparison: August 2019

CLINICAL DATA: Stage 3b chronic kidney disease (CKD) (HCC)

EXAM:
RENAL / URINARY TRACT ULTRASOUND COMPLETE

[Series 1: us renal · 0.23mm/px · 13 of 115 slices shown]
[im 1/115]
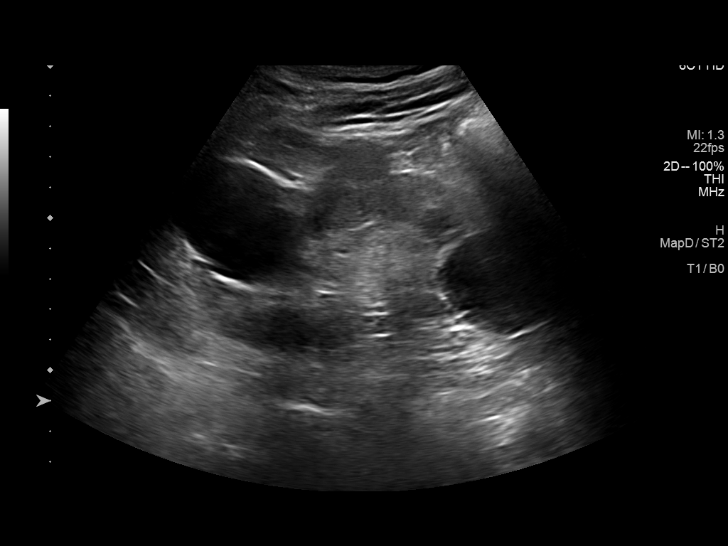
[im 10/115]
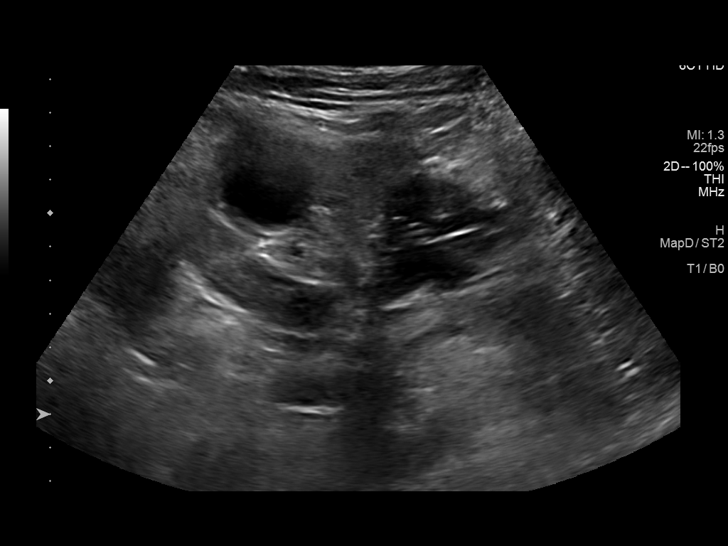
[im 20/115]
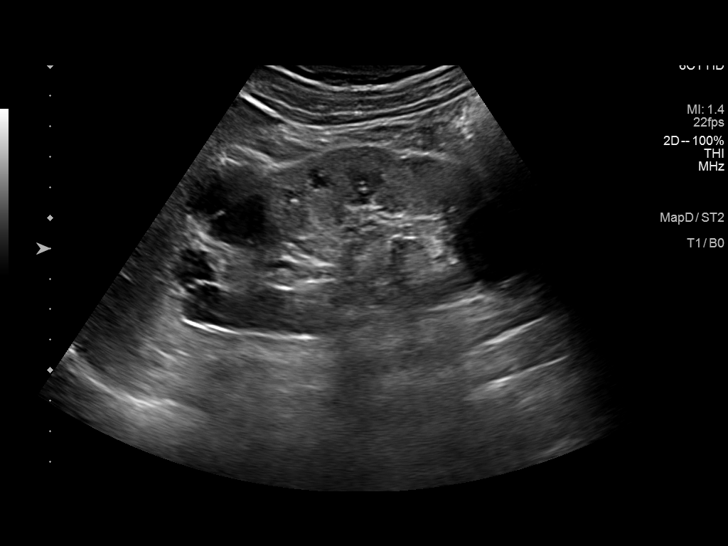
[im 29/115]
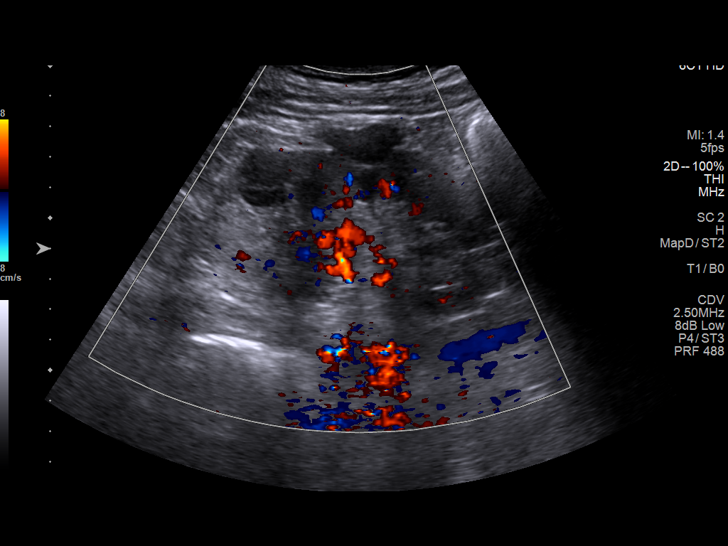
[im 39/115]
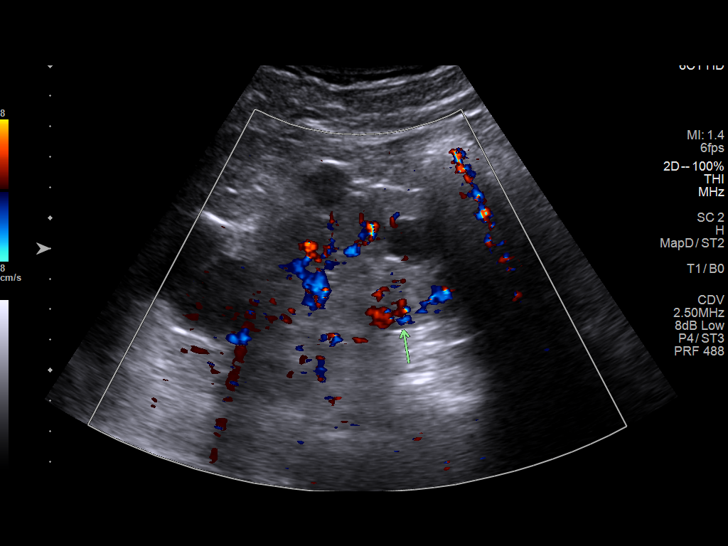
[im 48/115]
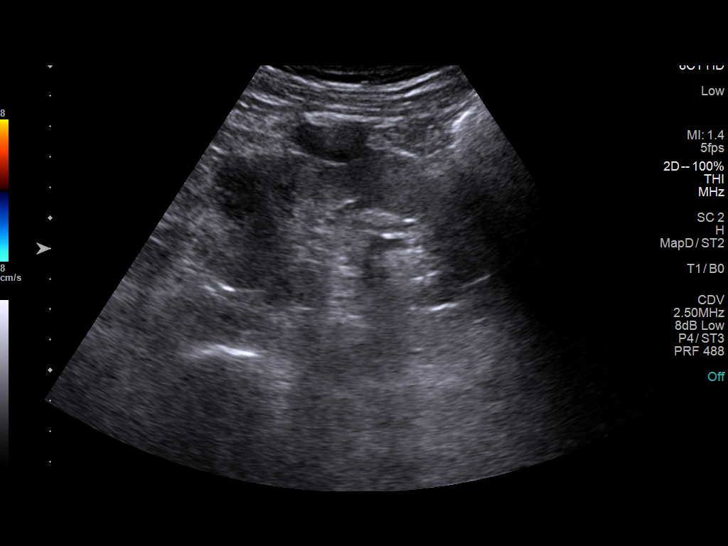
[im 58/115]
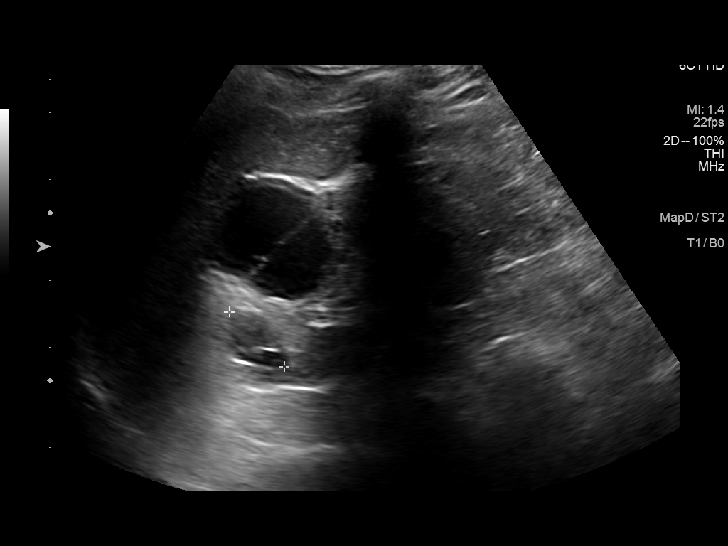
[im 67/115]
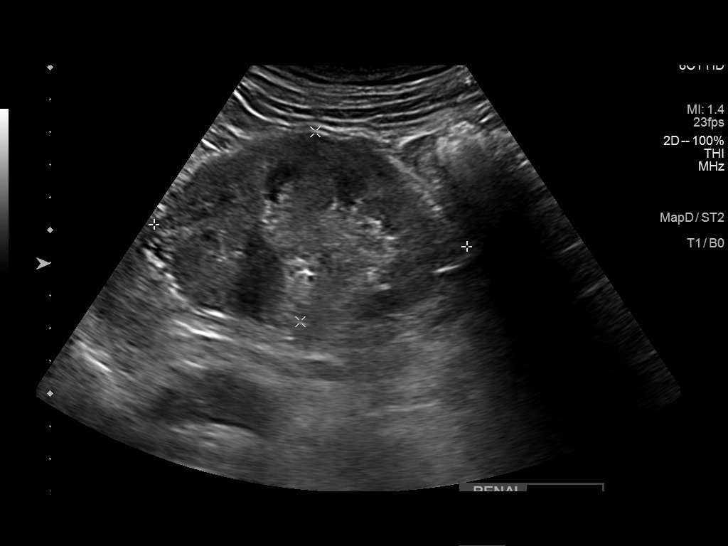
[im 77/115]
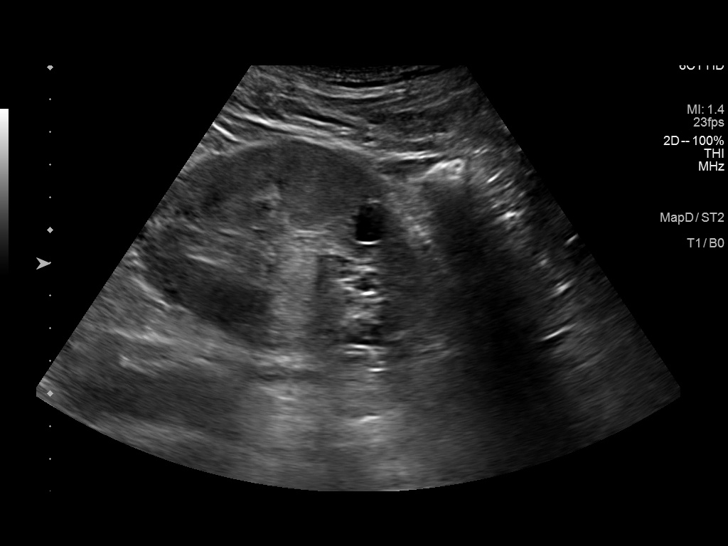
[im 86/115]
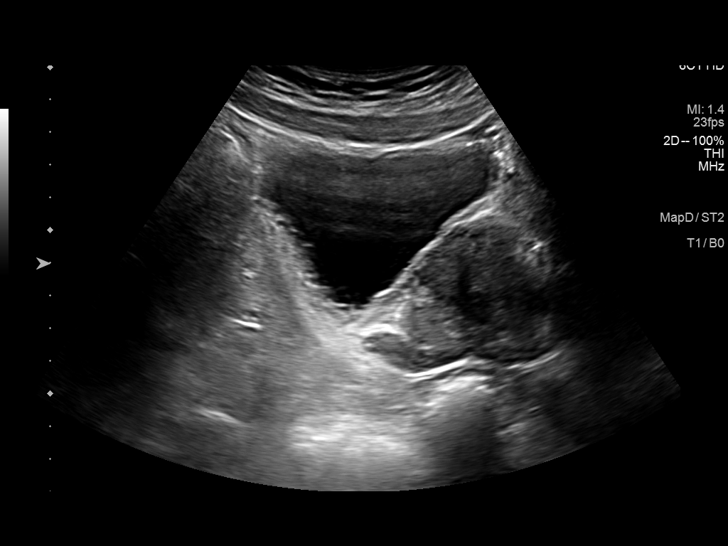
[im 96/115]
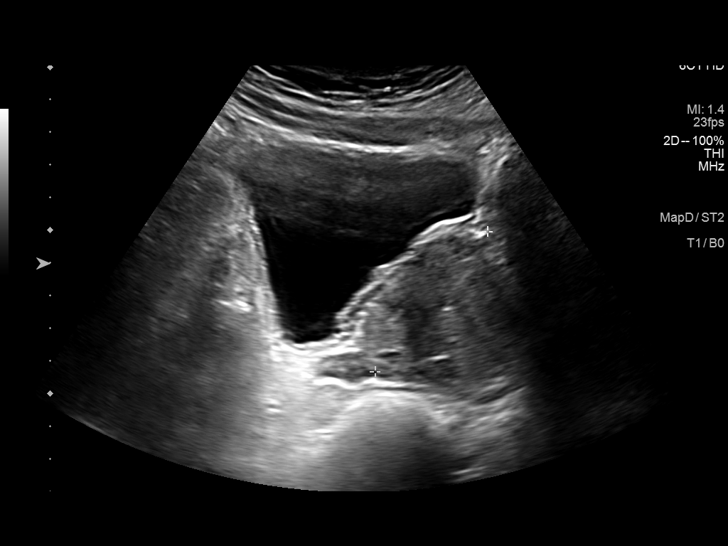
[im 105/115]
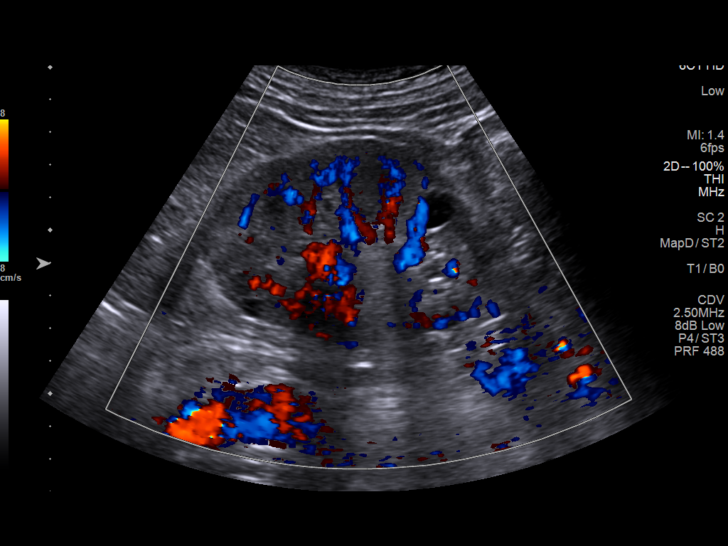
[im 115/115]
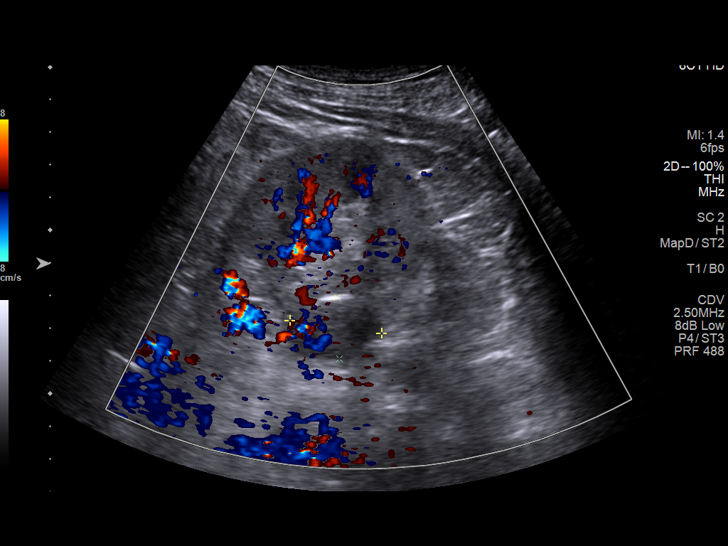

[13 of 25 positions shown; findings below may reference images not displayed]

FINDINGS: Right Kidney:

Renal measurements: 11.4 x 5.6 x 7.9 cm = volume: 263 mL. Diffuse
increased echogenicity of the renal cortex. Multiple predominantly
cystic right renal lesions are seen, some of which demonstrate
multiple internal septations. The largest of these measures up to
4.4 cm.

Left Kidney:

Renal measurements: 5.6 x 5.8 x 6.9 cm = volume: 202 mL. Diffuse
increased echogenicity of the left renal cortex. Scattered simple
renal cysts are again seen. In the left renal upper pole, there is a
solid-appearing 3.2 cm lesion.

Bladder:

Appears normal for degree of bladder distention.

Other:

The prostate gland is enlarged measuring up to 5.8 cm in maximum
dimension.
IMPRESSION: 1. Diffuse increased echogenicity of the kidneys compatible with
medical renal disease.
2. Bilateral renal lesions are again identified. The majority of
these appear to be simple renal cysts, however a couple of these on
the right side demonstrate multiple internal septations with the
largest measuring up to 4.4 cm. On the left side, there is a more
solid-appearing lesion in the left upper pole measuring up to
cm. Recommend MRI renal protocol for further evaluation of these
indeterminate bilateral renal cysts/masses, if not already
performed.
3. The prostate gland is enlarged measuring up to 5.8 cm.

## 2022-06-12 DIAGNOSIS — K080 Exfoliation of teeth due to systemic causes: Secondary | ICD-10-CM | POA: Diagnosis not present

## 2022-07-16 ENCOUNTER — Ambulatory Visit (INDEPENDENT_AMBULATORY_CARE_PROVIDER_SITE_OTHER): Payer: Medicare Other | Admitting: Internal Medicine

## 2022-07-16 ENCOUNTER — Ambulatory Visit: Payer: Medicare Other | Admitting: Family Medicine

## 2022-07-16 ENCOUNTER — Encounter: Payer: Self-pay | Admitting: Internal Medicine

## 2022-07-16 ENCOUNTER — Ambulatory Visit (INDEPENDENT_AMBULATORY_CARE_PROVIDER_SITE_OTHER): Payer: Medicare Other

## 2022-07-16 ENCOUNTER — Other Ambulatory Visit: Payer: Self-pay

## 2022-07-16 VITALS — BP 118/64 | HR 60 | Ht 72.0 in | Wt 165.0 lb

## 2022-07-16 VITALS — BP 118/64 | HR 60 | Temp 97.8°F | Ht 72.0 in | Wt 165.0 lb

## 2022-07-16 DIAGNOSIS — M25512 Pain in left shoulder: Secondary | ICD-10-CM

## 2022-07-16 DIAGNOSIS — S46012A Strain of muscle(s) and tendon(s) of the rotator cuff of left shoulder, initial encounter: Secondary | ICD-10-CM | POA: Diagnosis not present

## 2022-07-16 DIAGNOSIS — M19012 Primary osteoarthritis, left shoulder: Secondary | ICD-10-CM | POA: Diagnosis not present

## 2022-07-16 NOTE — Progress Notes (Signed)
Subjective:    Patient ID: Harold Pittman, male    DOB: 1940-07-04, 82 y.o.   MRN: [id number]      HPI Harold Pittman is here for  Chief Complaint  Patient presents with   Bleeding/Bruising    Larey Seat on easter Sunday and noticed a couple days later the bruising it is sore on shoulder area left arm     Larey Seat just over one week ago and had soreness in the left shoulder which is where he landed.  Denied any head injury or other injuries.  Couple of days later he noticed the extensive bruising of the left arm.  That has improved slightly.  He has difficulty lifting his arm, which is new since the fall.      Medications and allergies reviewed with patient and updated if appropriate.  Current Outpatient Medications on File Prior to Visit  Medication Sig Dispense Refill   allopurinol (ZYLOPRIM) 100 MG tablet Take 0.5 tablets (50 mg total) by mouth daily. 90 tablet 1   amLODipine (NORVASC) 10 MG tablet Take 0.5 tablets (5 mg total) by mouth daily. 90 tablet 1   aspirin 81 MG EC tablet Take 1 tablet (81 mg total) by mouth daily. 100 tablet 3   B Complex-Folic Acid (B COMPLEX PLUS) TABS Take 1 tablet by mouth daily. 100 tablet 3   Blood Glucose Monitoring Suppl (ONETOUCH VERIO FLEX SYSTEM) w/Device KIT USE TO MONITOR BLOOD SUGAR 1 kit 0   carvedilol (COREG) 25 MG tablet Take 1 tablet (25 mg total) by mouth 2 (two) times daily with a meal. 180 tablet 3   Cholecalciferol (VITAMIN D3) 50 MCG (2000 UT) capsule Take 1 capsule (2,000 Units total) by mouth daily. 100 capsule 3   colchicine 0.6 MG tablet Take 1 tablet (0.6 mg total) by mouth 2 (two) times daily. 180 tablet 3   cyclobenzaprine (FLEXERIL) 10 MG tablet Take 0.5 tablets (5 mg total) by mouth 2 (two) times daily as needed for muscle spasms. 10 tablet 0   ferrous sulfate 325 (65 FE) MG tablet Take 325 mg by mouth at bedtime.     glucose blood (ONETOUCH VERIO) test strip CHECK BLOOD SUGAR 1 TO 2  TIMES DAILY AS DIRECTED 200 strip 3    HYDROcodone-acetaminophen (NORCO) 5-325 MG tablet Take 0.5-1 tablets by mouth every 6 (six) hours as needed for severe pain. 5 tablet 0   Lancets (ONETOUCH DELICA PLUS LANCET30G) MISC USE AS DIRECTED 200 each 3   omeprazole (PRILOSEC) 40 MG capsule Take 1 capsule (40 mg total) by mouth daily. 90 capsule 3   pravastatin (PRAVACHOL) 80 MG tablet Take 1 tablet (80 mg total) by mouth at bedtime. 90 tablet 3   Prednisol Ace-Moxiflox-Bromfen 1-0.5-0.075 % SUSP Place 1 drop into the right eye 4 (four) times daily.     predniSONE (DELTASONE) 10 MG tablet 3 tabs by mouth per day for 3 days,2tabs per day for 3 days,1tab per day for 3 days 18 tablet 0   repaglinide (PRANDIN) 1 MG tablet TAKE 1TABLET BY MOUTH BEFORE OR WITH DINNER 90 tablet 3   torsemide (DEMADEX) 100 MG tablet TAKE 1 TABLET BY MOUTH DAILY (Patient taking differently: Take 100 mg by mouth daily.) 90 tablet 3   insulin detemir (LEVEMIR) 100 UNIT/ML injection Inject 0.1 mLs (10 Units total) into the skin daily for 7 days. 0.7 mL 0   No current facility-administered medications on file prior to visit.    Review of Systems  Musculoskeletal:  Positive for back pain (left upper back).  Neurological:  Negative for dizziness, light-headedness and headaches.       Objective:   Vitals:   07/16/22 1042  BP: 118/64  Pulse: 60  Temp: 97.8 F (36.6 C)  SpO2: 98%   BP Readings from Last 3 Encounters:  07/16/22 118/64  05/02/22 118/68  03/28/22 128/66   Wt Readings from Last 3 Encounters:  07/16/22 165 lb (74.8 kg)  05/08/22 163 lb (73.9 kg)  05/02/22 163 lb (73.9 kg)   Body mass index is 22.38 kg/m.    Physical Exam Constitutional:      General: He is not in acute distress.    Appearance: Normal appearance. He is not ill-appearing.  HENT:     Head: Normocephalic and atraumatic.  Cardiovascular:     Comments: Radial and ulnar pulses intact and strong on left upper extremity Musculoskeletal:        General: Swelling (Slight  swelling left shoulder) and tenderness (Tenderness with palpation proximal lateral left upper arm) present. No deformity.     Comments: Decreased range of motion of left arm-unable to lift completely and that does cause pain especially in the upper arm  Skin:    General: Skin is warm and dry.     Findings: Bruising (Bruising left upper and lower arm-minimal tenderness with palpation) present.  Neurological:     Mental Status: He is alert.     Sensory: No sensory deficit.     Motor: No weakness (No weakness in left hand).        DG Shoulder Left CLINICAL DATA:  Fall 1 week ago.  No a decreased range of motion.  EXAM: LEFT SHOULDER - 2+ VIEW  COMPARISON:  None.  FINDINGS: No gross fracture. No evidence for shoulder subluxation or dislocation. Degenerative changes are seen in the glenohumeral joint. There is some cortical irregularity along the inferior aspect of the glenoid, likely related to spurring although tiny fracture fragment or intra-articular loose body not excluded.  IMPRESSION: 1. Degenerative changes in the glenohumeral joint. 2. Cortical irregularity along the inferior aspect of the glenoid, likely related to spurring although tiny fracture fragment or intra-articular loose body not excluded.  Electronically Signed   By: Kennith Center M.D.   On: 07/16/2022 11:37      Assessment & Plan:   Left shoulder pain with decreased range of motion, left arm bruising: Acute Related to fall just over 1 week ago X-ray of left shoulder to rule out fracture, dislocation Possible rotator cuff tear Possible muscle tear in the upper arm X-ray today Refer to sports medicine-will be seen today to rule out soft tissue injury-muscle tear, rotator cuff tear Tylenol up to 3000 mg daily for pain He does have Norco at home that he can take for more severe pain-reviewed side effects and advised only to take for severe pain if Tylenol is not effective

## 2022-07-16 NOTE — Therapy (Signed)
OUTPATIENT PHYSICAL THERAPY SHOULDER EVALUATION   Patient Name: Harold SchmidtRichard H Sapp MRN: [id number] DOB:02/26/1941, 82 y.o., male Today's Date: 07/17/2022  END OF SESSION:  PT End of Session - 07/17/22 1453     Visit Number 1    Date for PT Re-Evaluation 10/09/22    Authorization Type BCBS Medicare    PT Start Time 1500    PT Stop Time 1545    PT Time Calculation (min) 45 min    Activity Tolerance Patient limited by pain;Patient tolerated treatment well             Past Medical History:  Diagnosis Date   Anemia    low iron   Anxiety state, unspecified    Blood in stool    Chronic airway obstruction, not elsewhere classified    Coronary artery disease    Coronary atherosclerosis of artery bypass graft    Dyspnea    ED (erectile dysfunction)    Esophageal reflux    Gout, unspecified    Osteoarthrosis, unspecified whether generalized or localized, unspecified site    Other and unspecified hyperlipidemia    Other specified disorder of rectum and anus    Personal history of tobacco use, presenting hazards to health    Sleep apnea    does not use cpap   Type II or unspecified type diabetes mellitus without mention of complication, not stated as uncontrolled    Unspecified essential hypertension    Past Surgical History:  Procedure Laterality Date   BIOPSY  09/26/2017   Procedure: BIOPSY;  Surgeon: Meryl DareStark, Malcolm T, MD;  [facility];  Service: Endoscopy;;   BIOPSY  02/03/2018   Procedure: BIOPSY;  Surgeon: Lemar LoftyMansouraty, Gabriel Jr., MD;  [facility];  Service: Gastroenterology;;   COLONOSCOPY WITH PROPOFOL N/A 09/26/2017   Procedure: COLONOSCOPY WITH PROPOFOL;  Surgeon: Meryl DareStark, Malcolm T, MD;  [facility];  Service: Endoscopy;  Laterality: N/A;   CORONARY ARTERY BYPASS GRAFT  97   LIMA to LAD, sequential saphenous vein graft to the first and second diagonal, sequential saphenous vein graft to the intermediate OM and circumflex and SVG to RCA    ESOPHAGOGASTRODUODENOSCOPY (EGD) WITH PROPOFOL N/A 09/26/2017   Procedure: ESOPHAGOGASTRODUODENOSCOPY (EGD) WITH PROPOFOL;  Surgeon: Meryl DareStark, Malcolm T, MD;  [facility];  Service: Endoscopy;  Laterality: N/A;   ESOPHAGOGASTRODUODENOSCOPY (EGD) WITH PROPOFOL N/A 02/03/2018   Procedure: ESOPHAGOGASTRODUODENOSCOPY (EGD);  Surgeon: Lemar LoftyMansouraty, Gabriel Jr., MD;  [facility];  Service: Gastroenterology;  Laterality: N/A;   KNEE SURGERY     BILATERAL   POLYPECTOMY  09/26/2017   Procedure: POLYPECTOMY;  Surgeon: Meryl DareStark, Malcolm T, MD;  [facility];  Service: Endoscopy;;   ROTATOR CUFF REPAIR     SUBMUCOSAL INJECTION  02/03/2018   Procedure: SUBMUCOSAL INJECTION;  Surgeon: Lemar LoftyMansouraty, Gabriel Jr., MD;  [facility];  Service: Gastroenterology;;   Patient Active Problem List   Diagnosis Date Noted   Acute gouty arthritis 03/28/2022   Gout flare 01/28/2022   Peripheral neuropathy 01/15/2022   Ataxia 01/15/2022   Type 2 diabetes mellitus (A1c 6.0 on 05/03/2021) with steroid-induced hyperglycemia 05/16/2021   CKD (chronic kidney disease) stage 4, GFR 15-29 ml/min 05/15/2021   Olecranon bursitis, right elbow 05/15/2021   Tremor 05/15/2021   Elbow pain, left 12/05/2020   Constipation 12/05/2020   Atherosclerosis of aorta 12/05/2020   Chest pain, atypical 12/05/2020   CTS (carpal tunnel syndrome) 03/24/2020   Grief 03/24/2020   Cholelithiasis 09/16/2019   Nausea 09/02/2019  Weight loss 09/02/2019   Elevated troponin 01/12/2019   Hypertensive urgency 01/10/2019   Well adult exam 10/20/2018   Balanitis 10/20/2018   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/27/2017   Adenoma of stomach 12/25/2017   Abnormal findings on esophagogastroduodenoscopy (EGD) 12/25/2017   Sebaceous cyst 10/04/2017   Iron deficiency anemia    Benign neoplasm of sigmoid colon    Benign neoplasm of cecum    Benign neoplasm of transverse colon    Positive occult stool blood  test 09/24/2017   (HFpEF) heart failure with preserved ejection fraction 09/24/2017   Hypokalemia 08/17/2015   Generalized weakness 07/22/2015   Gait disorder 03/18/2014   Low back pain 01/12/2014   History of colon polyps 12/17/2013   DM (diabetes mellitus), type 2 with peripheral vascular complications 09/16/2013   Diarrhea 10/08/2012   Edema 10/08/2011   DOE (dyspnea on exertion) 06/29/2011   LATERAL EPICONDYLITIS, LEFT 02/20/2010   TOBACCO USE, QUIT 02/23/2009   Cough 02/16/2009   Hearing loss 10/15/2008   ERECTILE DYSFUNCTION 04/30/2008   Coronary artery disease involving native coronary artery of native heart without angina pectoris 04/30/2008   COPD (chronic obstructive pulmonary disease) (HCC) 05/21/2007   Anxiety state 05/20/2007   Insomnia 03/04/2007   Hyperlipidemia LDL goal <70 02/01/2007   Gout attack 02/01/2007   Essential hypertension 02/01/2007   GERD 02/01/2007   Osteoarthritis 02/01/2007    PCP: Macarthur Critchley Plotnikov  REFERRING PROVIDER: [name]  REFERRING DIAG: M25.512 (ICD-10-CM) - Acute pain of left shoulder S46.012A (ICD-10-CM) - Traumatic complete tear of left rotator cuff, initial encounter  THERAPY DIAG:  Acute pain of left shoulder  Stiffness of left shoulder, not elsewhere classified  Muscle weakness (generalized)  Other abnormalities of gait and mobility  Rationale for Evaluation and Treatment: Rehabilitation  ONSET DATE: 07/08/22  SUBJECTIVE:                                                                                                                                                                                      SUBJECTIVE STATEMENT: Easter Sunday morning I was going to the closet to get my clothes out and I tripped and fell on my left shoulder.   Hand dominance: Right  PERTINENT HISTORY: Pt is an 82 y/o male c/o L shoulder pain ongoing since Easter Sunday, 3/31. He suffered a fall and then a couple days later noticed bruising  and soreness in his L shoulder area. Pt locates pain to all over the L shoulder joint, w/ bruising along the anterior forearm.   PAIN:  Are you having pain? Yes: NPRS scale: 6/10 Pain location: L shoulder Pain description: sore, sharp, ache Aggravating  factors: moving it, reaching up and behind me   Relieving factors: Tylenol  PRECAUTIONS: None  WEIGHT BEARING RESTRICTIONS: No  FALLS:  Has patient fallen in last 6 months? Yes. Number of falls "several falls but they didn't hurt me"   LIVING ENVIRONMENT: Lives with: lives alone Lives in: House/apartment Stairs: No Has following equipment at home: Quad cane small base  OCCUPATION: Retired  PLOF: Independent  PATIENT GOALS:get my arm back to normal, no pain  NEXT MD VISIT: 08/13/22  OBJECTIVE:   DIAGNOSTIC FINDINGS:  Diagnostic Limited MSK Ultrasound of: Left shoulder Biceps tendon appears to be intact and bicipital groove surrounded by hypoechoic fluid. Subscapularis tendon difficult to visualize may be complete rotator cuff tear with retraction. Supraspinatus tendon very difficult to visualize suspect full-thickness retracted rotator cuff tear. Infraspinatus tendon appears to be intact. Impression: Suspect full-thickness retracted rotator cuff tear supraspinatus tendon possibly subscapularis tendon.  FINDINGS: No gross fracture. No evidence for shoulder subluxation or dislocation. Degenerative changes are seen in the glenohumeral joint. There is some cortical irregularity along the inferior aspect of the glenoid, likely related to spurring although tiny fracture fragment or intra-articular loose body not excluded.   IMPRESSION: 1. Degenerative changes in the glenohumeral joint. 2. Cortical irregularity along the inferior aspect of the glenoid, likely related to spurring although tiny fracture fragment or intra-articular loose body not excluded.   PATIENT SURVEYS:  FOTO 62  COGNITION: Overall cognitive status: Within  functional limits for tasks assessed     SENSATION: WFL  POSTURE: Rounded shoulders, flexed trunk, increased thoracic kyphosis  UPPER EXTREMITY ROM:   Active ROM Right eval Left eval  Shoulder flexion WFL 70 w/pain  Shoulder extension    Shoulder abduction WFL 65 w/pain  Shoulder adduction    Shoulder internal rotation WFL Can get [facility] but some pain  Shoulder external rotation WFL Can get [facility] but some pain  Elbow flexion    Elbow extension    Wrist flexion    Wrist extension    Wrist ulnar deviation    Wrist radial deviation    Wrist pronation    Wrist supination    (Blank rows = not tested)  UPPER EXTREMITY MMT:  MMT Right eval Left eval  Shoulder flexion  2+  Shoulder extension    Shoulder abduction  2+  Shoulder adduction    Shoulder internal rotation  3+  Shoulder external rotation  3+  Middle trapezius    Lower trapezius    Elbow flexion  5  Elbow extension  5  Wrist flexion    Wrist extension    Wrist ulnar deviation    Wrist radial deviation    Wrist pronation    Wrist supination    Grip strength (lbs)    (Blank rows = not tested)  SHOULDER SPECIAL TESTS: Impingement tests: Hawkins/Kennedy impingement test: positive  and Painful arc test: positive  Rotator cuff assessment: Drop arm test: positive  and Empty can test: positive  Biceps assessment: Speed's test: positive   JOINT MOBILITY TESTING:  Muscle guarding past 90d of abduction, able to get near WNL with shoulder flexion  PALPATION:  Warm and TPP around shoulder joint and RC insertions, bruising in L arm and forearm   TODAY'S TREATMENT:  DATE: 07/17/22- EVAL    PATIENT EDUCATION: Education details: POC and HEP Person educated: Patient Education method: Explanation Education comprehension: verbalized understanding  HOME EXERCISE PROGRAM: Access Code:  [email or web address] URL: [email or web address] Date: 07/17/2022 Prepared by: Cassie Freer  Exercises - Supine Shoulder Flexion Extension AAROM with Dowel  - 1 x daily - 7 x weekly - 2 sets - 10 reps - Standing Shoulder Abduction AAROM with Dowel  - 1 x daily - 7 x weekly - 3 sets - 10 reps - Shoulder Flexion Wall Slide with Towel  - 1 x daily - 7 x weekly - 2 sets - 10 reps - Standing Shoulder Row with Anchored Resistance  - 1 x daily - 7 x weekly - 2 sets - 10 reps - Shoulder extension with resistance - Neutral  - 1 x daily - 7 x weekly - 2 sets - 10 reps  ASSESSMENT:  CLINICAL IMPRESSION: Patient is a 82 y.o. male who was seen today for physical therapy evaluation and treatment for L shoulder pain. He suffered a fall on Easter morning that led to a full thickness rotator cuff tear with retraction. He does have significant bruising around his L shoulder and in him forearm. Patient is very limited mostly with abduction and shoulder flexion. He is a bit unsteady on his feet and reports he has a history of falls. Patient will benefit from skilled PT to address his L shoulder limitations for strength and ROM to be able to reach overhead and complete ADLs.   OBJECTIVE IMPAIRMENTS: decreased balance, decreased ROM, decreased strength, impaired UE functional use, and pain.   ACTIVITY LIMITATIONS: carrying, lifting, dressing, reach over head, and hygiene/grooming  REHAB POTENTIAL: Good  CLINICAL DECISION MAKING: Stable/uncomplicated  EVALUATION COMPLEXITY: Low  GOALS: Goals reviewed with patient? Yes  SHORT TERM GOALS: Target date: 08/28/22  Patient will be independent with initial HEP.  Goal status: INITIAL   LONG TERM GOALS: Target date: 10/09/22  Patient will be independent with advanced/ongoing HEP to improve outcomes and carryover.  Goal status: INITIAL  2.  Patient will report 75% improvement in L shoulder pain to improve QOL.  Baseline: 6/10 Goal status: INITIAL  3.   Patient to improve L shoulder AROM to [facility] without pain provocation to allow for increased ease of ADLs.  Goal status: INITIAL  4.  Patient will demonstrate improved functional UE strength as demonstrated by 5/5. Baseline: 2+, 3+ Goal status: INITIAL  5.  Patient will report 78 on FOTO (patient outcome measure)  to demonstrate improved functional ability.  Baseline: 62 Goal status: INITIAL  PLAN:  PT FREQUENCY: 2x/week  PT DURATION: 12 weeks  PLANNED INTERVENTIONS: Therapeutic exercises, Therapeutic activity, Neuromuscular re-education, Balance training, Gait training, Patient/Family education, Self Care, Joint mobilization, Electrical stimulation, Cryotherapy, Moist heat, Vasopneumatic device, Ultrasound, Ionotophoresis 4mg /ml Dexamethasone, and Manual therapy  PLAN FOR NEXT SESSION: AAROM, PROM, light strengthening for L shoulder as tolerated    Cassie Freer, PT 07/17/2022, 4:01 PM

## 2022-07-16 NOTE — Patient Instructions (Signed)
Thank you for coming in today.   I've referred you to Physical Therapy.  Let us know if you don't hear from them in one week.   Recheck in 1 month.   Let me know sooner if this is not working.

## 2022-07-16 NOTE — Progress Notes (Signed)
Harold Payor, PhD, LAT, ATC acting as a scribe for Harold Graham, MD.  Subjective:    CC: L shoulder pain  HPI: Pt is an 82 y/o male c/o L shoulder pain ongoing since Easter Sunday, 3/31. He suffered a fall and then a couple days later noticed bruising and soreness in his L shoulder area. Pt locates pain to all over the L shoulder joint, w/ bruising along the anterior forearm.    Aggravates: ER, aBd,  Treatments tried: tylenol  Dx imaging: 07/16/22 L shoulder XR  Pertinent review of Systems: No fevers or chills  Relevant historical information: Heart failure.  Diabetes.  COPD.     Objective:    Vitals:   07/16/22 1245  BP: 118/64  Pulse: 60  SpO2: 98%   General: Well Developed, well nourished, and in no acute distress.   MSK: Left shoulder significant bruising on anterior upper arm extending down to the forearm. Nontender Range of motion limited abduction and internal rotation active both reproduced by pain and weakness. Strength: Abduction  3/5.  External rotation intact internal rotation mildly reduced 4/5. Mildly positive Yergason's and speeds test. Inadequate positioning for Hawkins and Neer's testing.  Lab and Radiology Results   Diagnostic Limited MSK Ultrasound of: Left shoulder Biceps tendon appears to be intact and bicipital groove surrounded by hypoechoic fluid. Subscapularis tendon difficult to visualize may be complete rotator cuff tear with retraction. Supraspinatus tendon very difficult to visualize suspect full-thickness retracted rotator cuff tear. Infraspinatus tendon appears to be intact. Impression: Suspect full-thickness retracted rotator cuff tear supraspinatus tendon possibly subscapularis tendon.   No results found for this or any previous visit (from the past 72 hour(s)). DG Shoulder Left  Result Date: 07/16/2022 CLINICAL DATA:  Fall 1 week ago.  No a decreased range of motion. EXAM: LEFT SHOULDER - 2+ VIEW COMPARISON:  None. FINDINGS:  No gross fracture. No evidence for shoulder subluxation or dislocation. Degenerative changes are seen in the glenohumeral joint. There is some cortical irregularity along the inferior aspect of the glenoid, likely related to spurring although tiny fracture fragment or intra-articular loose body not excluded. IMPRESSION: 1. Degenerative changes in the glenohumeral joint. 2. Cortical irregularity along the inferior aspect of the glenoid, likely related to spurring although tiny fracture fragment or intra-articular loose body not excluded. Electronically Signed   By: Kennith Center M.D.   On: 07/16/2022 11:37    I, Harold Pittman, personally (independently) visualized and performed the interpretation of the images attached in this note.   Impression and Recommendations:    Assessment and Plan: 82 y.o. male with acute pain weakness and bruising left shoulder.  Concerning for full-thickness rotator cuff tear with retraction.  Discussed options.  He would like to avoid surgery and he is a poor surgical candidate regardless. Plan for physical therapy referral and check back in 1 month.  PDMP not reviewed this encounter. Orders Placed This Encounter  Procedures   [place] LIMITED JOINT SPACE STRUCTURES UP LEFT(NO LINKED CHARGES)    Order Specific Question:   Reason for Exam (SYMPTOM  OR DIAGNOSIS REQUIRED)    Answer:   left shoulder pain    Order Specific Question:   Preferred imaging location?    Answer:   [facility] referral to Physical Therapy    Referral Priority:   Routine    Referral Type:   Physical Medicine    Referral Reason:   Specialty Services Required    Requested Specialty:  Physical Therapy    Number of Visits Requested:   1   No orders of the defined types were placed in this encounter.   Discussed warning signs or symptoms. Please see discharge instructions. Patient expresses understanding.   The above documentation has been reviewed and is  accurate and complete Harold Pittman, M.D.

## 2022-07-16 NOTE — Patient Instructions (Addendum)
     Have an xray downstairs.  Make an appointment with sports medicine  - Left shoulder pain, decreased ROM, Left arm bruising - ? Torn mm.        Medications changes include :   take tylenol for the mild-moderate - you can take up to 3000 mg / day.  For severe pain take the hydrocodone you have at home.      A referral was ordered for sports medicine.

## 2022-07-17 ENCOUNTER — Ambulatory Visit: Payer: Medicare Other | Attending: Family Medicine

## 2022-07-17 DIAGNOSIS — S46012A Strain of muscle(s) and tendon(s) of the rotator cuff of left shoulder, initial encounter: Secondary | ICD-10-CM | POA: Insufficient documentation

## 2022-07-17 DIAGNOSIS — M25612 Stiffness of left shoulder, not elsewhere classified: Secondary | ICD-10-CM

## 2022-07-17 DIAGNOSIS — M25512 Pain in left shoulder: Secondary | ICD-10-CM | POA: Diagnosis not present

## 2022-07-17 DIAGNOSIS — M6281 Muscle weakness (generalized): Secondary | ICD-10-CM | POA: Insufficient documentation

## 2022-07-17 DIAGNOSIS — R2689 Other abnormalities of gait and mobility: Secondary | ICD-10-CM | POA: Diagnosis not present

## 2022-07-24 ENCOUNTER — Ambulatory Visit: Payer: Medicare Other | Admitting: Physical Therapy

## 2022-07-25 ENCOUNTER — Other Ambulatory Visit: Payer: Self-pay

## 2022-07-25 ENCOUNTER — Ambulatory Visit: Payer: Medicare Other

## 2022-07-25 DIAGNOSIS — M25612 Stiffness of left shoulder, not elsewhere classified: Secondary | ICD-10-CM

## 2022-07-25 DIAGNOSIS — M25512 Pain in left shoulder: Secondary | ICD-10-CM

## 2022-07-25 DIAGNOSIS — R2689 Other abnormalities of gait and mobility: Secondary | ICD-10-CM | POA: Diagnosis not present

## 2022-07-25 DIAGNOSIS — S46012A Strain of muscle(s) and tendon(s) of the rotator cuff of left shoulder, initial encounter: Secondary | ICD-10-CM | POA: Diagnosis not present

## 2022-07-25 DIAGNOSIS — M6281 Muscle weakness (generalized): Secondary | ICD-10-CM | POA: Diagnosis not present

## 2022-07-25 NOTE — Therapy (Signed)
OUTPATIENT PHYSICAL THERAPY SHOULDER EVALUATION   Patient Name: Harold Pittman MRN: [id number] DOB:02-08-1941, 82 y.o., male Today's Date: 07/25/2022  END OF SESSION:  PT End of Session - 07/25/22 1305     Visit Number 2    Date for PT Re-Evaluation 10/09/22    Authorization Type [employer] Medicare    PT Start Time 1300    PT Stop Time 1340    PT Time Calculation (min) 40 min    Activity Tolerance Patient tolerated treatment well    Behavior During Therapy WFL for tasks assessed/performed              Past Medical History:  Diagnosis Date   Anemia    low iron   Anxiety state, unspecified    Blood in stool    Chronic airway obstruction, not elsewhere classified    Coronary artery disease    Coronary atherosclerosis of artery bypass graft    Dyspnea    ED (erectile dysfunction)    Esophageal reflux    Gout, unspecified    Osteoarthrosis, unspecified whether generalized or localized, unspecified site    Other and unspecified hyperlipidemia    Other specified disorder of rectum and anus    Personal history of tobacco use, presenting hazards to health    Sleep apnea    does not use cpap   Type II or unspecified type diabetes mellitus without mention of complication, not stated as uncontrolled    Unspecified essential hypertension    Past Surgical History:  Procedure Laterality Date   BIOPSY  09/26/2017   Procedure: BIOPSY;  Surgeon: Meryl Dare, MD;  Location: [facility] ENDOSCOPY;  Service: Endoscopy;;   BIOPSY  02/03/2018   Procedure: BIOPSY;  Surgeon: Lemar Lofty., MD;  Location: [facility] ENDOSCOPY;  Service: Gastroenterology;;   COLONOSCOPY WITH PROPOFOL N/A 09/26/2017   Procedure: COLONOSCOPY WITH PROPOFOL;  Surgeon: Meryl Dare, MD;  Location: [facility] ENDOSCOPY;  Service: Endoscopy;  Laterality: N/A;   CORONARY ARTERY BYPASS GRAFT  97   LIMA to LAD, sequential saphenous vein graft to the first and second diagonal, sequential saphenous vein graft to the  intermediate OM and circumflex and SVG to RCA   ESOPHAGOGASTRODUODENOSCOPY (EGD) WITH PROPOFOL N/A 09/26/2017   Procedure: ESOPHAGOGASTRODUODENOSCOPY (EGD) WITH PROPOFOL;  Surgeon: Meryl Dare, MD;  Location: [facility] ENDOSCOPY;  Service: Endoscopy;  Laterality: N/A;   ESOPHAGOGASTRODUODENOSCOPY (EGD) WITH PROPOFOL N/A 02/03/2018   Procedure: ESOPHAGOGASTRODUODENOSCOPY (EGD);  Surgeon: Lemar Lofty., MD;  Location: [facility] ENDOSCOPY;  Service: Gastroenterology;  Laterality: N/A;   KNEE SURGERY     BILATERAL   POLYPECTOMY  09/26/2017   Procedure: POLYPECTOMY;  Surgeon: Meryl Dare, MD;  Location: [facility] ENDOSCOPY;  Service: Endoscopy;;   ROTATOR CUFF REPAIR     SUBMUCOSAL INJECTION  02/03/2018   Procedure: SUBMUCOSAL INJECTION;  Surgeon: Lemar Lofty., MD;  Location: [facility] ENDOSCOPY;  Service: Gastroenterology;;   Patient Active Problem List   Diagnosis Date Noted   Acute gouty arthritis 03/28/2022   Gout flare 01/28/2022   Peripheral neuropathy 01/15/2022   Ataxia 01/15/2022   Type 2 diabetes mellitus (A1c 6.0 on 05/03/2021) with steroid-induced hyperglycemia 05/16/2021   CKD (chronic kidney disease) stage 4, GFR 15-29 ml/min 05/15/2021   Olecranon bursitis, right elbow 05/15/2021   Tremor 05/15/2021   Elbow pain, left 12/05/2020   Constipation 12/05/2020   Atherosclerosis of aorta 12/05/2020   Chest pain, atypical 12/05/2020   CTS (carpal tunnel syndrome) 03/24/2020   Grief 03/24/2020  Cholelithiasis 09/16/2019   Nausea 09/02/2019   Weight loss 09/02/2019   Elevated troponin 01/12/2019   Hypertensive urgency 01/10/2019   Well adult exam 10/20/2018   Balanitis 10/20/2018   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/27/2017   Adenoma of stomach 12/25/2017   Abnormal findings on esophagogastroduodenoscopy (EGD) 12/25/2017   Sebaceous cyst 10/04/2017   Iron deficiency anemia    Benign neoplasm of sigmoid colon    Benign neoplasm of cecum    Benign neoplasm of  transverse colon    Positive occult stool blood test 09/24/2017   (HFpEF) heart failure with preserved ejection fraction 09/24/2017   Hypokalemia 08/17/2015   Generalized weakness 07/22/2015   Gait disorder 03/18/2014   Low back pain 01/12/2014   History of colon polyps 12/17/2013   DM (diabetes mellitus), type 2 with peripheral vascular complications 09/16/2013   Diarrhea 10/08/2012   Edema 10/08/2011   DOE (dyspnea on exertion) 06/29/2011   LATERAL EPICONDYLITIS, LEFT 02/20/2010   TOBACCO USE, QUIT 02/23/2009   Cough 02/16/2009   Hearing loss 10/15/2008   ERECTILE DYSFUNCTION 04/30/2008   Coronary artery disease involving native coronary artery of native heart without angina pectoris 04/30/2008   COPD (chronic obstructive pulmonary disease) (HCC) 05/21/2007   Anxiety state 05/20/2007   Insomnia 03/04/2007   Hyperlipidemia LDL goal <70 02/01/2007   Gout attack 02/01/2007   Essential hypertension 02/01/2007   GERD 02/01/2007   Osteoarthritis 02/01/2007    PCP: Macarthur Critchley Plotnikov  REFERRING PROVIDER: [name]  REFERRING DIAG: M25.512 (ICD-10-CM) - Acute pain of left shoulder S46.012A (ICD-10-CM) - Traumatic complete tear of left rotator cuff, initial encounter  THERAPY DIAG:  Acute pain of left shoulder  Stiffness of left shoulder, not elsewhere classified  Muscle weakness (generalized)  Rationale for Evaluation and Treatment: Rehabilitation  ONSET DATE: 07/08/22  SUBJECTIVE:                                                                                                                                                                                      SUBJECTIVE STATEMENT: I've been using my arm, feels a lot better, not hurting like it was Hand dominance: Right  PERTINENT HISTORY: Pt is an 82 y/o male c/o L shoulder pain ongoing since Easter Sunday, 3/31. He suffered a fall and then a couple days later noticed bruising and soreness in his L shoulder area. Pt  locates pain to all over the L shoulder joint, w/ bruising along the anterior forearm.   PAIN:  Are you having pain? Yes: NPRS scale: 6/10 Pain location: L shoulder Pain description: sore, sharp, ache Aggravating factors: moving it, reaching up and behind me  Relieving factors: Tylenol  PRECAUTIONS: None  WEIGHT BEARING RESTRICTIONS: No  FALLS:  Has patient fallen in last 6 months? Yes. Number of falls "several falls but they didn't hurt me"   LIVING ENVIRONMENT: Lives with: lives alone Lives in: House/apartment Stairs: No Has following equipment at home: Quad cane small base  OCCUPATION: Retired  PLOF: Independent  PATIENT GOALS:get my arm back to normal, no pain  NEXT MD VISIT: 08/13/22  OBJECTIVE:   DIAGNOSTIC FINDINGS:  Diagnostic Limited MSK Ultrasound of: Left shoulder Biceps tendon appears to be intact and bicipital groove surrounded by hypoechoic fluid. Subscapularis tendon difficult to visualize may be complete rotator cuff tear with retraction. Supraspinatus tendon very difficult to visualize suspect full-thickness retracted rotator cuff tear. Infraspinatus tendon appears to be intact. Impression: Suspect full-thickness retracted rotator cuff tear supraspinatus tendon possibly subscapularis tendon.  FINDINGS: No gross fracture. No evidence for shoulder subluxation or dislocation. Degenerative changes are seen in the glenohumeral joint. There is some cortical irregularity along the inferior aspect of the glenoid, likely related to spurring although tiny fracture fragment or intra-articular loose body not excluded.   IMPRESSION: 1. Degenerative changes in the glenohumeral joint. 2. Cortical irregularity along the inferior aspect of the glenoid, likely related to spurring although tiny fracture fragment or intra-articular loose body not excluded.   PATIENT SURVEYS:  FOTO 62  COGNITION: Overall cognitive status: Within functional limits for tasks  assessed     SENSATION: WFL  POSTURE: Rounded shoulders, flexed trunk, increased thoracic kyphosis  UPPER EXTREMITY ROM:   Active ROM Right eval Left eval  Shoulder flexion WFL 70 w/pain  Shoulder extension    Shoulder abduction WFL 65 w/pain  Shoulder adduction    Shoulder internal rotation WFL Can get [facility] but some pain  Shoulder external rotation WFL Can get [facility] but some pain  Elbow flexion    Elbow extension    Wrist flexion    Wrist extension    Wrist ulnar deviation    Wrist radial deviation    Wrist pronation    Wrist supination    (Blank rows = not tested)  UPPER EXTREMITY MMT:  MMT Right eval Left eval  Shoulder flexion  2+  Shoulder extension    Shoulder abduction  2+  Shoulder adduction    Shoulder internal rotation  3+  Shoulder external rotation  3+  Middle trapezius    Lower trapezius    Elbow flexion  5  Elbow extension  5  Wrist flexion    Wrist extension    Wrist ulnar deviation    Wrist radial deviation    Wrist pronation    Wrist supination    Grip strength (lbs)    (Blank rows = not tested)  SHOULDER SPECIAL TESTS: Impingement tests: Hawkins/Kennedy impingement test: positive  and Painful arc test: positive  Rotator cuff assessment: Drop arm test: positive  and Empty can test: positive  Biceps assessment: Speed's test: positive   JOINT MOBILITY TESTING:  Muscle guarding past 90d of abduction, able to get near WNL with shoulder flexion  PALPATION:  Warm and TPP around shoulder joint and RC insertions, bruising in L arm and forearm   TODAY'S TREATMENT:  DATE:  07/25/22 SUPINE AAROM L shoulder all planes Seated red t band L UE rows Seated red t band L shoulder ext with elbow straight Seated isometric B shoulder ER Seated L bicpes curls 2# Seated red t band B scapular punches(band around  trunk) Beach ball squeezes , 15 reps 5 sec holds UBE level 1,  2 min F, 2 min B  07/17/22- EVAL    PATIENT EDUCATION: Education details: POC and HEP Person educated: Patient Education method: Explanation Education comprehension: verbalized understanding  HOME EXERCISE PROGRAM: Access Code: [email or web address] URL: [email or web address] Date: 07/17/2022 Prepared by: Cassie Freer  Exercises - Supine Shoulder Flexion Extension AAROM with Dowel  - 1 x daily - 7 x weekly - 2 sets - 10 reps - Standing Shoulder Abduction AAROM with Dowel  - 1 x daily - 7 x weekly - 3 sets - 10 reps - Shoulder Flexion Wall Slide with Towel  - 1 x daily - 7 x weekly - 2 sets - 10 reps - Standing Shoulder Row with Anchored Resistance  - 1 x daily - 7 x weekly - 2 sets - 10 reps - Shoulder extension with resistance - Neutral  - 1 x daily - 7 x weekly - 2 sets - 10 reps  ASSESSMENT:  CLINICAL IMPRESSION: Patient is a 82 y.o. male who was seen today for physical therapy treatment for L shoulder pain.  Pain is much improved today  he still has edema and bruising extending down into his L forearm.His flexibility L shoulder with AARROM is nearly full ,has weakness and abnormal movement patterns particularly with forward flexion, and has a visibly dropped L humeral head.   He is still unsteady on his feet and reports he has a history of falls. Patient will benefit from skilled PT to address his L shoulder limitations for strength and ROM to be able to reach overhead and complete ADLs.   OBJECTIVE IMPAIRMENTS: decreased balance, decreased ROM, decreased strength, impaired UE functional use, and pain.   ACTIVITY LIMITATIONS: carrying, lifting, dressing, reach over head, and hygiene/grooming  REHAB POTENTIAL: Good  CLINICAL DECISION MAKING: Stable/uncomplicated  EVALUATION COMPLEXITY: Low  GOALS: Goals reviewed with patient? Yes  SHORT TERM GOALS: Target date: 08/28/22  Patient will be independent with  initial HEP.  Goal status: INITIAL   LONG TERM GOALS: Target date: 10/09/22  Patient will be independent with advanced/ongoing HEP to improve outcomes and carryover.  Goal status: INITIAL  2.  Patient will report 75% improvement in L shoulder pain to improve QOL.  Baseline: 6/10 Goal status: INITIAL  3.  Patient to improve L shoulder AROM to [facility] without pain provocation to allow for increased ease of ADLs.  Goal status: INITIAL  4.  Patient will demonstrate improved functional UE strength as demonstrated by 5/5. Baseline: 2+, 3+ Goal status: INITIAL  5.  Patient will report 29 on FOTO (patient outcome measure)  to demonstrate improved functional ability.  Baseline: 62 Goal status: INITIAL  PLAN:  PT FREQUENCY: 2x/week  PT DURATION: 12 weeks  PLANNED INTERVENTIONS: Therapeutic exercises, Therapeutic activity, Neuromuscular re-education, Balance training, Gait training, Patient/Family education, Self Care, Joint mobilization, Electrical stimulation, Cryotherapy, Moist heat, Vasopneumatic device, Ultrasound, Ionotophoresis 4mg /ml Dexamethasone, and Manual therapy  PLAN FOR NEXT SESSION: AAROM, PROM, light strengthening for L shoulder as tolerated    Chanc Kervin L Alexiana Laverdure, PT 07/25/2022, 1:42 PM

## 2022-07-26 ENCOUNTER — Ambulatory Visit: Payer: Medicare Other

## 2022-07-31 ENCOUNTER — Ambulatory Visit: Payer: Medicare Other

## 2022-07-31 DIAGNOSIS — R2689 Other abnormalities of gait and mobility: Secondary | ICD-10-CM

## 2022-07-31 DIAGNOSIS — S46012A Strain of muscle(s) and tendon(s) of the rotator cuff of left shoulder, initial encounter: Secondary | ICD-10-CM | POA: Diagnosis not present

## 2022-07-31 DIAGNOSIS — M25612 Stiffness of left shoulder, not elsewhere classified: Secondary | ICD-10-CM | POA: Diagnosis not present

## 2022-07-31 DIAGNOSIS — M25512 Pain in left shoulder: Secondary | ICD-10-CM | POA: Diagnosis not present

## 2022-07-31 DIAGNOSIS — M6281 Muscle weakness (generalized): Secondary | ICD-10-CM | POA: Diagnosis not present

## 2022-07-31 NOTE — Therapy (Signed)
OUTPATIENT PHYSICAL THERAPY SHOULDER TREATMENT   Patient Name: Harold Pittman MRN: [id number] DOB:02-06-1941, 82 y.o., male Today's Date: 07/25/2022  END OF SESSION:  PT End of Session - 07/25/22 1305     Visit Number 2    Date for PT Re-Evaluation 10/09/22    Authorization Type [employer] Medicare    PT Start Time 1300    PT Stop Time 1340    PT Time Calculation (min) 40 min    Activity Tolerance Patient tolerated treatment well    Behavior During Therapy WFL for tasks assessed/performed              Past Medical History:  Diagnosis Date   Anemia    low iron   Anxiety state, unspecified    Blood in stool    Chronic airway obstruction, not elsewhere classified    Coronary artery disease    Coronary atherosclerosis of artery bypass graft    Dyspnea    ED (erectile dysfunction)    Esophageal reflux    Gout, unspecified    Osteoarthrosis, unspecified whether generalized or localized, unspecified site    Other and unspecified hyperlipidemia    Other specified disorder of rectum and anus    Personal history of tobacco use, presenting hazards to health    Sleep apnea    does not use cpap   Type II or unspecified type diabetes mellitus without mention of complication, not stated as uncontrolled    Unspecified essential hypertension    Past Surgical History:  Procedure Laterality Date   BIOPSY  09/26/2017   Procedure: BIOPSY;  Surgeon: Meryl Dare, MD;  Location: [facility] ENDOSCOPY;  Service: Endoscopy;;   BIOPSY  02/03/2018   Procedure: BIOPSY;  Surgeon: Lemar Lofty., MD;  Location: [facility] ENDOSCOPY;  Service: Gastroenterology;;   COLONOSCOPY WITH PROPOFOL N/A 09/26/2017   Procedure: COLONOSCOPY WITH PROPOFOL;  Surgeon: Meryl Dare, MD;  Location: [facility] ENDOSCOPY;  Service: Endoscopy;  Laterality: N/A;   CORONARY ARTERY BYPASS GRAFT  97   LIMA to LAD, sequential saphenous vein graft to the first and second diagonal, sequential saphenous vein graft to the intermediate  OM and circumflex and SVG to RCA   ESOPHAGOGASTRODUODENOSCOPY (EGD) WITH PROPOFOL N/A 09/26/2017   Procedure: ESOPHAGOGASTRODUODENOSCOPY (EGD) WITH PROPOFOL;  Surgeon: Meryl Dare, MD;  Location: [facility] ENDOSCOPY;  Service: Endoscopy;  Laterality: N/A;   ESOPHAGOGASTRODUODENOSCOPY (EGD) WITH PROPOFOL N/A 02/03/2018   Procedure: ESOPHAGOGASTRODUODENOSCOPY (EGD);  Surgeon: Lemar Lofty., MD;  Location: [facility] ENDOSCOPY;  Service: Gastroenterology;  Laterality: N/A;   KNEE SURGERY     BILATERAL   POLYPECTOMY  09/26/2017   Procedure: POLYPECTOMY;  Surgeon: Meryl Dare, MD;  Location: [facility] ENDOSCOPY;  Service: Endoscopy;;   ROTATOR CUFF REPAIR     SUBMUCOSAL INJECTION  02/03/2018   Procedure: SUBMUCOSAL INJECTION;  Surgeon: Lemar Lofty., MD;  Location: [facility] ENDOSCOPY;  Service: Gastroenterology;;   Patient Active Problem List   Diagnosis Date Noted   Acute gouty arthritis 03/28/2022   Gout flare 01/28/2022   Peripheral neuropathy 01/15/2022   Ataxia 01/15/2022   Type 2 diabetes mellitus (A1c 6.0 on 05/03/2021) with steroid-induced hyperglycemia 05/16/2021   CKD (chronic kidney disease) stage 4, GFR 15-29 ml/min 05/15/2021   Olecranon bursitis, right elbow 05/15/2021   Tremor 05/15/2021   Elbow pain, left 12/05/2020   Constipation 12/05/2020   Atherosclerosis of aorta 12/05/2020   Chest pain, atypical 12/05/2020   CTS (carpal tunnel syndrome) 03/24/2020   Grief 03/24/2020  Cholelithiasis 09/16/2019   Nausea 09/02/2019   Weight loss 09/02/2019   Elevated troponin 01/12/2019   Hypertensive urgency 01/10/2019   Well adult exam 10/20/2018   Balanitis 10/20/2018   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/27/2017   Adenoma of stomach 12/25/2017   Abnormal findings on esophagogastroduodenoscopy (EGD) 12/25/2017   Sebaceous cyst 10/04/2017   Iron deficiency anemia    Benign neoplasm of sigmoid colon    Benign neoplasm of cecum    Benign neoplasm of transverse  colon    Positive occult stool blood test 09/24/2017   (HFpEF) heart failure with preserved ejection fraction 09/24/2017   Hypokalemia 08/17/2015   Generalized weakness 07/22/2015   Gait disorder 03/18/2014   Low back pain 01/12/2014   History of colon polyps 12/17/2013   DM (diabetes mellitus), type 2 with peripheral vascular complications 09/16/2013   Diarrhea 10/08/2012   Edema 10/08/2011   DOE (dyspnea on exertion) 06/29/2011   LATERAL EPICONDYLITIS, LEFT 02/20/2010   TOBACCO USE, QUIT 02/23/2009   Cough 02/16/2009   Hearing loss 10/15/2008   ERECTILE DYSFUNCTION 04/30/2008   Coronary artery disease involving native coronary artery of native heart without angina pectoris 04/30/2008   COPD (chronic obstructive pulmonary disease) (HCC) 05/21/2007   Anxiety state 05/20/2007   Insomnia 03/04/2007   Hyperlipidemia LDL goal <70 02/01/2007   Gout attack 02/01/2007   Essential hypertension 02/01/2007   GERD 02/01/2007   Osteoarthritis 02/01/2007    PCP: Macarthur Critchley Plotnikov  REFERRING PROVIDER: [name]  REFERRING DIAG: M25.512 (ICD-10-CM) - Acute pain of left shoulder S46.012A (ICD-10-CM) - Traumatic complete tear of left rotator cuff, initial encounter  THERAPY DIAG:  Acute pain of left shoulder  Stiffness of left shoulder, not elsewhere classified  Muscle weakness (generalized)  Rationale for Evaluation and Treatment: Rehabilitation  ONSET DATE: 07/08/22  SUBJECTIVE:                                                                                                                                                                                      SUBJECTIVE STATEMENT: Shoulder feels a lot better, not hurting like it was  Hand dominance: Right  PERTINENT HISTORY: Pt is an 82 y/o male c/o L shoulder pain ongoing since Easter Sunday, 3/31. He suffered a fall and then a couple days later noticed bruising and soreness in his L shoulder area. Pt locates pain to all over the  L shoulder joint, w/ bruising along the anterior forearm.   PAIN:  Are you having pain? Yes: NPRS scale: 2/10 Pain location: L shoulder Pain description: sore, sharp, ache Aggravating factors: moving it, reaching up and behind me   Relieving factors: Tylenol  PRECAUTIONS: None  WEIGHT BEARING RESTRICTIONS: No  FALLS:  Has patient fallen in last 6 months? Yes. Number of falls "several falls but they didn't hurt me"   LIVING ENVIRONMENT: Lives with: lives alone Lives in: House/apartment Stairs: No Has following equipment at home: Quad cane small base  OCCUPATION: Retired  PLOF: Independent  PATIENT GOALS:get my arm back to normal, no pain  NEXT MD VISIT: 08/13/22  OBJECTIVE:   DIAGNOSTIC FINDINGS:  Diagnostic Limited MSK Ultrasound of: Left shoulder Biceps tendon appears to be intact and bicipital groove surrounded by hypoechoic fluid. Subscapularis tendon difficult to visualize may be complete rotator cuff tear with retraction. Supraspinatus tendon very difficult to visualize suspect full-thickness retracted rotator cuff tear. Infraspinatus tendon appears to be intact. Impression: Suspect full-thickness retracted rotator cuff tear supraspinatus tendon possibly subscapularis tendon.  FINDINGS: No gross fracture. No evidence for shoulder subluxation or dislocation. Degenerative changes are seen in the glenohumeral joint. There is some cortical irregularity along the inferior aspect of the glenoid, likely related to spurring although tiny fracture fragment or intra-articular loose body not excluded.   IMPRESSION: 1. Degenerative changes in the glenohumeral joint. 2. Cortical irregularity along the inferior aspect of the glenoid, likely related to spurring although tiny fracture fragment or intra-articular loose body not excluded.   PATIENT SURVEYS:  FOTO 62  COGNITION: Overall cognitive status: Within functional limits for tasks  assessed     SENSATION: WFL  POSTURE: Rounded shoulders, flexed trunk, increased thoracic kyphosis  UPPER EXTREMITY ROM:   Active ROM Right eval Left eval  Shoulder flexion WFL 70 w/pain  Shoulder extension    Shoulder abduction WFL 65 w/pain  Shoulder adduction    Shoulder internal rotation WFL Can get [facility] but some pain  Shoulder external rotation WFL Can get [facility] but some pain  Elbow flexion    Elbow extension    Wrist flexion    Wrist extension    Wrist ulnar deviation    Wrist radial deviation    Wrist pronation    Wrist supination    (Blank rows = not tested)  UPPER EXTREMITY MMT:  MMT Right eval Left eval  Shoulder flexion  2+  Shoulder extension    Shoulder abduction  2+  Shoulder adduction    Shoulder internal rotation  3+  Shoulder external rotation  3+  Middle trapezius    Lower trapezius    Elbow flexion  5  Elbow extension  5  Wrist flexion    Wrist extension    Wrist ulnar deviation    Wrist radial deviation    Wrist pronation    Wrist supination    Grip strength (lbs)    (Blank rows = not tested)  SHOULDER SPECIAL TESTS: Impingement tests: Hawkins/Kennedy impingement test: positive  and Painful arc test: positive  Rotator cuff assessment: Drop arm test: positive  and Empty can test: positive  Biceps assessment: Speed's test: positive   JOINT MOBILITY TESTING:  Muscle guarding past 90d of abduction, able to get near WNL with shoulder flexion  PALPATION:  Warm and TPP around shoulder joint and RC insertions, bruising in L arm and forearm   TODAY'S TREATMENT:  DATE:  07/31/22 Wall slides x10 flexion and abd Red TB rows and ext 2x10 UBE L2 x forwards/back 1# seated flexion 3x5 Supine shoulder flexion 2x10 Supine chest press 2# 2x10  PROM to L shoulder Vaso to L shoulder 10 mins, med  pressure   07/25/22 SUPINE AAROM L shoulder all planes Seated red t band L UE rows Seated red t band L shoulder ext with elbow straight Seated isometric B shoulder ER Seated L bicpes curls 2# Seated red t band B scapular punches(band around trunk) Beach ball squeezes , 15 reps 5 sec holds UBE level 1,  2 min F, 2 min B  07/17/22- EVAL    PATIENT EDUCATION: Education details: POC and HEP Person educated: Patient Education method: Explanation Education comprehension: verbalized understanding  HOME EXERCISE PROGRAM: Access Code: [email or web address] URL: [email or web address] Date: 07/17/2022 Prepared by: Cassie Freer  Exercises - Supine Shoulder Flexion Extension AAROM with Dowel  - 1 x daily - 7 x weekly - 2 sets - 10 reps - Standing Shoulder Abduction AAROM with Dowel  - 1 x daily - 7 x weekly - 3 sets - 10 reps - Shoulder Flexion Wall Slide with Towel  - 1 x daily - 7 x weekly - 2 sets - 10 reps - Standing Shoulder Row with Anchored Resistance  - 1 x daily - 7 x weekly - 2 sets - 10 reps - Shoulder extension with resistance - Neutral  - 1 x daily - 7 x weekly - 2 sets - 10 reps  ASSESSMENT:  CLINICAL IMPRESSION: Patient reports pain is much improved today  he still has edema and bruising extending down into his L forearm. He is still unsteady on his feet and reports he has a history of falls, CGA needed with standing exercises. Focused on strengthening today as tolerated. Needs verbal and tactile cues throughout session for form. Very difficult to do flexion with 1#. 1 LOB at end of visit needs modA to prevent from falling.   OBJECTIVE IMPAIRMENTS: decreased balance, decreased ROM, decreased strength, impaired UE functional use, and pain.   ACTIVITY LIMITATIONS: carrying, lifting, dressing, reach over head, and hygiene/grooming  REHAB POTENTIAL: Good  CLINICAL DECISION MAKING: Stable/uncomplicated  EVALUATION COMPLEXITY: Low  GOALS: Goals reviewed with patient?  Yes  SHORT TERM GOALS: Target date: 08/28/22  Patient will be independent with initial HEP.  Goal status: IN PROGRESS   LONG TERM GOALS: Target date: 10/09/22  Patient will be independent with advanced/ongoing HEP to improve outcomes and carryover.  Goal status: INITIAL  2.  Patient will report 75% improvement in L shoulder pain to improve QOL.  Baseline: 6/10 Goal status: INITIAL  3.  Patient to improve L shoulder AROM to [facility] without pain provocation to allow for increased ease of ADLs.  Goal status: INITIAL  4.  Patient will demonstrate improved functional UE strength as demonstrated by 5/5. Baseline: 2+, 3+ Goal status: INITIAL  5.  Patient will report 25 on FOTO (patient outcome measure)  to demonstrate improved functional ability.  Baseline: 62 Goal status: INITIAL  PLAN:  PT FREQUENCY: 2x/week  PT DURATION: 12 weeks  PLANNED INTERVENTIONS: Therapeutic exercises, Therapeutic activity, Neuromuscular re-education, Balance training, Gait training, Patient/Family education, Self Care, Joint mobilization, Electrical stimulation, Cryotherapy, Moist heat, Vasopneumatic device, Ultrasound, Ionotophoresis /ml Dexamethasone, and Manual therapy  PLAN FOR NEXT SESSION: AAROM, PROM, light strengthening for L shoulder as tolerated    Amy L Speaks, PT 07/25/2022, 1:42 PM

## 2022-08-01 ENCOUNTER — Ambulatory Visit (INDEPENDENT_AMBULATORY_CARE_PROVIDER_SITE_OTHER): Payer: Medicare Other | Admitting: Internal Medicine

## 2022-08-01 ENCOUNTER — Encounter: Payer: Self-pay | Admitting: Internal Medicine

## 2022-08-01 VITALS — BP 120/68 | HR 61 | Temp 97.8°F | Ht 72.0 in | Wt 164.0 lb

## 2022-08-01 DIAGNOSIS — R600 Localized edema: Secondary | ICD-10-CM | POA: Diagnosis not present

## 2022-08-01 DIAGNOSIS — D500 Iron deficiency anemia secondary to blood loss (chronic): Secondary | ICD-10-CM | POA: Diagnosis not present

## 2022-08-01 DIAGNOSIS — N184 Chronic kidney disease, stage 4 (severe): Secondary | ICD-10-CM

## 2022-08-01 DIAGNOSIS — I5032 Chronic diastolic (congestive) heart failure: Secondary | ICD-10-CM

## 2022-08-01 DIAGNOSIS — E1151 Type 2 diabetes mellitus with diabetic peripheral angiopathy without gangrene: Secondary | ICD-10-CM | POA: Diagnosis not present

## 2022-08-01 DIAGNOSIS — E1159 Type 2 diabetes mellitus with other circulatory complications: Secondary | ICD-10-CM

## 2022-08-01 LAB — COMPREHENSIVE METABOLIC PANEL
ALT: 20 U/L (ref 0–53)
AST: 26 U/L (ref 0–37)
Albumin: 4.4 g/dL (ref 3.5–5.2)
Alkaline Phosphatase: 133 U/L — ABNORMAL HIGH (ref 39–117)
BUN: 32 mg/dL — ABNORMAL HIGH (ref 6–23)
CO2: 31 mEq/L (ref 19–32)
Calcium: 9.8 mg/dL (ref 8.4–10.5)
Chloride: 108 mEq/L (ref 96–112)
Creatinine, Ser: 2.05 mg/dL — ABNORMAL HIGH (ref 0.40–1.50)
GFR: 29.87 mL/min — ABNORMAL LOW (ref >60.00)
Glucose, Bld: 66 mg/dL — ABNORMAL LOW (ref 70–99)
Potassium: 4 mEq/L (ref 3.5–5.1)
Sodium: 148 mEq/L — ABNORMAL HIGH (ref 135–145)
Total Bilirubin: 0.5 mg/dL (ref 0.2–1.2)
Total Protein: 7 g/dL (ref 6.0–8.3)

## 2022-08-01 LAB — HEMOGLOBIN A1C: Hgb A1c MFr Bld: 5.5 % (ref 4.6–6.5)

## 2022-08-01 LAB — TSH: TSH: 0.7 u[IU]/mL (ref 0.35–5.50)

## 2022-08-01 NOTE — Assessment & Plan Note (Signed)
Cont on Coreg, Hydralazine, Lasix, Irbesartan  °

## 2022-08-01 NOTE — Assessment & Plan Note (Signed)
C/o cold and numb hands - long time; PVD and neuropathy 

## 2022-08-01 NOTE — Patient Instructions (Signed)
USEFUL THINGS FOR HAND ARTHRITIS  RICE SOCK HEATING PAD: [email or web address]    SILICONE PADS:    BRIX JAR OPENER:    NITRILE COATED GARDEN GLOVES:   THUMB BRACE:     BLUE EMU CREAM:    

## 2022-08-01 NOTE — Assessment & Plan Note (Signed)
Monitor CBC 

## 2022-08-01 NOTE — Assessment & Plan Note (Signed)
C/o cold and numb hands - long time; PVD and neuropathy

## 2022-08-01 NOTE — Assessment & Plan Note (Signed)
On Torsemide Reduce Amlodipine to 5 mg/day - d/c'd

## 2022-08-01 NOTE — Assessment & Plan Note (Signed)
Hydrate well ?Monitor GFR ?

## 2022-08-01 NOTE — Progress Notes (Signed)
Subjective:  Patient ID: Harold Pittman, male    DOB: November 07, 1940  Age: 82 y.o. MRN: [id number]  CC: No chief complaint on file.   HPI Iasiah Ozment Pittman presents for cold and numb hands - long time Feet ok F/u CHF, DM, ataxia   Outpatient Medications Prior to Visit  Medication Sig Dispense Refill   allopurinol (ZYLOPRIM) 100 MG tablet Take 0.5 tablets (50 mg total) by mouth daily. 90 tablet 1   amLODipine (NORVASC) 10 MG tablet Take 0.5 tablets (5 mg total) by mouth daily. 90 tablet 1   aspirin 81 MG EC tablet Take 1 tablet (81 mg total) by mouth daily. 100 tablet 3   B Complex-Folic Acid (B COMPLEX PLUS) TABS Take 1 tablet by mouth daily. 100 tablet 3   Blood Glucose Monitoring Suppl (ONETOUCH VERIO FLEX SYSTEM) w/Device KIT USE TO MONITOR BLOOD SUGAR 1 kit 0   carvedilol (COREG) 25 MG tablet Take 1 tablet (25 mg total) by mouth 2 (two) times daily with a meal. 180 tablet 3   Cholecalciferol (VITAMIN D3) 50 MCG (2000 UT) capsule Take 1 capsule (2,000 Units total) by mouth daily. 100 capsule 3   colchicine 0.6 MG tablet Take 1 tablet (0.6 mg total) by mouth 2 (two) times daily. 180 tablet 3   cyclobenzaprine (FLEXERIL) 10 MG tablet Take 0.5 tablets (5 mg total) by mouth 2 (two) times daily as needed for muscle spasms. 10 tablet 0   ferrous sulfate 325 (65 FE) MG tablet Take 325 mg by mouth at bedtime.     glucose blood (ONETOUCH VERIO) test strip CHECK BLOOD SUGAR 1 TO 2  TIMES DAILY AS DIRECTED 200 strip 3   HYDROcodone-acetaminophen (NORCO) 5-325 MG tablet Take 0.5-1 tablets by mouth every 6 (six) hours as needed for severe pain. 5 tablet 0   Lancets (ONETOUCH DELICA PLUS LANCET30G) MISC USE AS DIRECTED 200 each 3   omeprazole (PRILOSEC) 40 MG capsule Take 1 capsule (40 mg total) by mouth daily. 90 capsule 3   pravastatin (PRAVACHOL) 80 MG tablet Take 1 tablet (80 mg total) by mouth at bedtime. 90 tablet 3   Prednisol Ace-Moxiflox-Bromfen 1-0.5-0.075 % SUSP Place 1 drop into the right  eye 4 (four) times daily.     predniSONE (DELTASONE) 10 MG tablet 3 tabs by mouth per day for 3 days,2tabs per day for 3 days,1tab per day for 3 days 18 tablet 0   repaglinide (PRANDIN) 1 MG tablet TAKE 1TABLET BY MOUTH BEFORE OR WITH DINNER 90 tablet 3   torsemide (DEMADEX) 100 MG tablet TAKE 1 TABLET BY MOUTH DAILY (Patient taking differently: Take 100 mg by mouth daily.) 90 tablet 3   insulin detemir (LEVEMIR) 100 UNIT/ML injection Inject 0.1 mLs (10 Units total) into the skin daily for 7 days. 0.7 mL 0   No facility-administered medications prior to visit.    ROS: Review of Systems  Constitutional:  Negative for appetite change, fatigue and unexpected weight change.  HENT:  Negative for congestion, nosebleeds, sneezing, sore throat and trouble swallowing.   Eyes:  Negative for itching and visual disturbance.  Respiratory:  Negative for cough.   Cardiovascular:  Negative for chest pain, palpitations and leg swelling.  Gastrointestinal:  Negative for abdominal distention, blood in stool, diarrhea and nausea.  Genitourinary:  Negative for frequency and hematuria.  Musculoskeletal:  Positive for arthralgias and gait problem. Negative for back pain, joint swelling and neck pain.  Skin:  Negative for rash.  Neurological:  Positive for weakness. Negative for dizziness, tremors and speech difficulty.  Psychiatric/Behavioral:  Negative for agitation, decreased concentration, dysphoric mood, sleep disturbance and suicidal ideas. The patient is not nervous/anxious.     Objective:  BP 120/68 (BP Location: Right Arm, Patient Position: Sitting, Cuff Size: Normal)   Pulse 61   Temp 97.8 F (36.6 C) (Oral)   Ht 6' (1.829 m)   Wt 164 lb (74.4 kg)   SpO2 96%   BMI 22.24 kg/m   BP Readings from Last 3 Encounters:  08/01/22 120/68  07/16/22 118/64  07/16/22 118/64    Wt Readings from Last 3 Encounters:  08/01/22 164 lb (74.4 kg)  07/16/22 165 lb (74.8 kg)  07/16/22 165 lb (74.8 kg)     Physical Exam Constitutional:      General: He is not in acute distress.    Appearance: Normal appearance. He is well-developed.     Comments: NAD  Eyes:     Conjunctiva/sclera: Conjunctivae normal.     Pupils: Pupils are equal, round, and reactive to light.  Neck:     Thyroid: No thyromegaly.     Vascular: No JVD.  Cardiovascular:     Rate and Rhythm: Normal rate and regular rhythm.     Heart sounds: Normal heart sounds. No murmur heard.    No friction rub. No gallop.  Pulmonary:     Effort: Pulmonary effort is normal. No respiratory distress.     Breath sounds: Normal breath sounds. No wheezing or rales.  Chest:     Chest wall: No tenderness.  Abdominal:     General: Bowel sounds are normal. There is no distension.     Palpations: Abdomen is soft. There is no mass.     Tenderness: There is no abdominal tenderness. There is no guarding or rebound.  Musculoskeletal:        General: No tenderness. Normal range of motion.     Cervical back: Normal range of motion.  Lymphadenopathy:     Cervical: No cervical adenopathy.  Skin:    General: Skin is warm and dry.     Findings: No rash.  Neurological:     Mental Status: He is alert and oriented to person, place, and time.     Cranial Nerves: No cranial nerve deficit.     Motor: No abnormal muscle tone.     Coordination: Coordination normal.     Gait: Gait abnormal.     Deep Tendon Reflexes: Reflexes are normal and symmetric.  Psychiatric:        Behavior: Behavior normal.        Thought Content: Thought content normal.        Judgment: Judgment normal.   Using a cane  Lab Results  Component Value Date   WBC 6.1 05/02/2022   HGB 11.5 (L) 05/02/2022   HCT 33.5 (L) 05/02/2022   PLT 198.0 05/02/2022   GLUCOSE 107 (H) 05/02/2022   CHOL 120 07/21/2019   TRIG 68.0 07/21/2019   HDL 31.70 (L) 07/21/2019   LDLCALC 75 07/21/2019   ALT 17 05/02/2022   AST 23 05/02/2022   NA 145 05/02/2022   K 4.4 05/02/2022   CL 106  05/02/2022   CREATININE 2.41 (H) 05/02/2022   BUN 42 (H) 05/02/2022   CO2 30 05/02/2022   TSH 0.99 05/02/2022   PSA 8.92 (H) 04/22/2019   INR 1.35 07/23/2015   HGBA1C 5.5 05/02/2022   MICROALBUR 38.7 (H) 11/24/2013    VAS [place] LOWER  EXTREMITY VENOUS (DVT) (ONLY [facility] & [facility])  Result Date: 01/26/2022  Lower Venous DVT Study Patient Name:  HOVANES HYMAS  Date of Exam:   01/26/2022 Medical Rec #: [id number]        Accession #:    [id number] Date of Birth: 1940-10-22         Patient Gender: M Patient Age:   61 years Exam Location:  [facility] Procedure:      VAS [place] LOWER EXTREMITY VENOUS (DVT) Referring Phys: COOPER ROBBINS --------------------------------------------------------------------------------  Indications: Edema.  Comparison Study: No prior study Performing Technologist: Gertie Fey MHA, RDMS, RVT, RDCS  Examination Guidelines: A complete evaluation includes B-mode imaging, spectral Doppler, color Doppler, and power Doppler as needed of all accessible portions of each vessel. Bilateral testing is considered an integral part of a complete examination. Limited examinations for reoccurring indications may be performed as noted. The reflux portion of the exam is performed with the patient in reverse Trendelenburg.  +---------+---------------+---------+-----------+----------+--------------+ RIGHT    CompressibilityPhasicitySpontaneityPropertiesThrombus Aging +---------+---------------+---------+-----------+----------+--------------+ CFV      Full           Yes      Yes                                 +---------+---------------+---------+-----------+----------+--------------+ SFJ      Full                                                        +---------+---------------+---------+-----------+----------+--------------+ FV Prox  Full                                                        +---------+---------------+---------+-----------+----------+--------------+ FV  Mid   Full                                                        +---------+---------------+---------+-----------+----------+--------------+ FV DistalFull                                                        +---------+---------------+---------+-----------+----------+--------------+ PFV      Full                                                        +---------+---------------+---------+-----------+----------+--------------+ POP      Full           Yes      Yes                                 +---------+---------------+---------+-----------+----------+--------------+ PTV  Full                                                        +---------+---------------+---------+-----------+----------+--------------+ PERO     Full                                                        +---------+---------------+---------+-----------+----------+--------------+   +---------+---------------+---------+-----------+----------+--------------+ LEFT     CompressibilityPhasicitySpontaneityPropertiesThrombus Aging +---------+---------------+---------+-----------+----------+--------------+ CFV      Full           Yes      Yes                                 +---------+---------------+---------+-----------+----------+--------------+ SFJ      Full                                                        +---------+---------------+---------+-----------+----------+--------------+ FV Prox  Full                                                        +---------+---------------+---------+-----------+----------+--------------+ FV Mid   Full                                                        +---------+---------------+---------+-----------+----------+--------------+ FV DistalFull                                                        +---------+---------------+---------+-----------+----------+--------------+ PFV      Full                                                         +---------+---------------+---------+-----------+----------+--------------+ POP      Full           Yes      Yes                                 +---------+---------------+---------+-----------+----------+--------------+ PTV      Full                                                        +---------+---------------+---------+-----------+----------+--------------+  PERO     Full                                                        +---------+---------------+---------+-----------+----------+--------------+     Summary: RIGHT: - There is no evidence of deep vein thrombosis in the lower extremity.  - No cystic structure found in the popliteal fossa.  LEFT: - There is no evidence of deep vein thrombosis in the lower extremity.  - No cystic structure found in the popliteal fossa.  *See table(s) above for measurements and observations. Electronically signed by Lemar Livings MD on 01/26/2022 at 5:28:02 PM.    Final    CT Chest Wo Contrast  Result Date: 01/26/2022 CLINICAL DATA:  82 year old male with history of chest pain radiating into the upper back. EXAM: CT CHEST WITHOUT CONTRAST TECHNIQUE: Multidetector CT imaging of the chest was performed following the standard protocol without IV contrast. RADIATION DOSE REDUCTION: This exam was performed according to the departmental dose-optimization program which includes automated exposure control, adjustment of the mA and/or kV according to patient size and/or use of iterative reconstruction technique. COMPARISON:  No priors. FINDINGS: Cardiovascular: Heart size is normal. There is no significant pericardial fluid, thickening or pericardial calcification. There is aortic atherosclerosis, as well as atherosclerosis of the great vessels of the mediastinum and the coronary arteries, including calcified atherosclerotic plaque in the left main, left anterior descending, left circumflex and right coronary arteries. Status post median  sternotomy for CABG including LIMA to the LAD. Mediastinum/Nodes: No pathologically enlarged mediastinal or hilar lymph nodes. Please note that accurate exclusion of hilar adenopathy is limited on noncontrast CT scans. Esophagus is unremarkable in appearance. No axillary lymphadenopathy. Lungs/Pleura: No acute consolidative airspace disease. No pleural effusions. Cluster of micro nodularity in the basal aspect of the right lower lobe, strongly favored to be benign areas of mucoid impaction within terminal bronchioles. No other larger more suspicious appearing pulmonary nodules or masses are noted. Diffuse bronchial wall thickening with mild centrilobular and paraseptal emphysema. Upper Abdomen: Aortic atherosclerosis. Calcified gallstones lying dependently in the gallbladder. Multiple low-attenuation lesions in the visualized liver, incompletely characterized on today's noncontrast CT examination, but statistically likely to represent cysts (no imaging follow-up is recommended). Incompletely imaged lesion in the upper pole of the right kidney which is complex in appearance with some faint internal calcifications. Additional 1.6 cm high attenuation lesion in the upper pole of the right kidney also noted. 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 Musculoskeletal: Median sternotomy wires. In the soft tissues of the upper anterior left chest wall adjacent to the first costochondral junction and inferior to the medial left clavicle (axial image 23 of series 3 and coronal image 59 of series 5) there is a partially calcified high density lesion measuring 3.1 x 2.4 x 1.9 cm. There are no aggressive appearing lytic or blastic lesions noted in the visualized portions of the skeleton. IMPRESSION: 1. No acute findings in the thorax to account for the patient's symptoms. 2. Unusual partially calcified soft tissue lesion  located between the undersurface of the medial left clavicle in the adjacent left first costochondral junction. This is of uncertain etiology and significance, but is favored to represent a benign area of heterotopic ossification. Given  the patient's advanced age, repeat noncontrast chest CT is suggested in 3-6 months to ensure the stability of this finding. 3. Aortic atherosclerosis, in addition to left main and three-vessel coronary artery disease. Status post median sternotomy for CABG including LIMA to the LAD. 4. Diffuse bronchial wall thickening with mild centrilobular and paraseptal emphysema; imaging findings suggestive of underlying COPD. 5. Cholelithiasis. 6. Additional incidental findings, as above. Aortic Atherosclerosis (ICD10-I70.0) and Emphysema (ICD10-J43.9). Electronically Signed   By: Trudie Reed M.D.   On: 01/26/2022 09:30   DG Chest 2 View  Result Date: 01/25/2022 CLINICAL DATA:  Chest pain EXAM: CHEST - 2 VIEW COMPARISON:  05/15/2021 FINDINGS: Stable cardiomediastinal contours status post sternotomy and CABG. No focal airspace consolidation, pleural effusion, or pneumothorax. IMPRESSION: No active cardiopulmonary disease. Electronically Signed   By: Duanne Guess D.O.   On: 01/25/2022 16:04    Assessment & Plan:   Problem List Items Addressed This Visit       Cardiovascular and Mediastinum   (HFpEF) heart failure with preserved ejection fraction    Cont on Coreg, Hydralazine, Lasix, Irbesartan       Relevant Orders   Hemoglobin A1c   Comprehensive metabolic panel   TSH   DM (diabetes mellitus), type 2 with peripheral vascular complications - Primary    C/o cold and numb hands - long time; PVD and neuropathy      Relevant Orders   Hemoglobin A1c   Comprehensive metabolic panel   TSH     Endocrine   Type 2 diabetes mellitus (A1c 6.0 on 05/03/2021) with steroid-induced hyperglycemia    C/o cold and numb hands - long time; PVD and neuropathy         Genitourinary   CKD (chronic kidney disease) stage 4, GFR 15-29 ml/min    Hydrate well Monitor GFR      Relevant Orders   Hemoglobin A1c   Comprehensive metabolic panel   TSH     Other   Edema    On Torsemide Reduce Amlodipine to 5 mg/day - d/c'd      Iron deficiency anemia    Monitor CBC         No orders of the defined types were placed in this encounter.     Follow-up: Return in about 3 months (around 10/31/2022) for a follow-up visit.  Sonda Primes, MD

## 2022-08-02 ENCOUNTER — Ambulatory Visit: Payer: Medicare Other

## 2022-08-04 ENCOUNTER — Other Ambulatory Visit: Payer: Self-pay | Admitting: Internal Medicine

## 2022-08-07 ENCOUNTER — Encounter: Payer: Self-pay | Admitting: Physical Therapy

## 2022-08-07 ENCOUNTER — Ambulatory Visit: Payer: Medicare Other | Admitting: Physical Therapy

## 2022-08-07 DIAGNOSIS — M25512 Pain in left shoulder: Secondary | ICD-10-CM | POA: Diagnosis not present

## 2022-08-07 DIAGNOSIS — M25612 Stiffness of left shoulder, not elsewhere classified: Secondary | ICD-10-CM | POA: Diagnosis not present

## 2022-08-07 DIAGNOSIS — S46012A Strain of muscle(s) and tendon(s) of the rotator cuff of left shoulder, initial encounter: Secondary | ICD-10-CM | POA: Diagnosis not present

## 2022-08-07 DIAGNOSIS — M6281 Muscle weakness (generalized): Secondary | ICD-10-CM | POA: Diagnosis not present

## 2022-08-07 DIAGNOSIS — R2689 Other abnormalities of gait and mobility: Secondary | ICD-10-CM | POA: Diagnosis not present

## 2022-08-07 NOTE — Therapy (Signed)
OUTPATIENT PHYSICAL THERAPY SHOULDER TREATMENT   Patient Name: Harold Pittman MRN: [id number] DOB:01-12-1941, 82 y.o., male Today's Date: 08/07/2022  END OF SESSION:  PT End of Session - 08/07/22 1431     Visit Number 4    Date for PT Re-Evaluation 10/09/22    PT Start Time 1430    PT Stop Time 1515    PT Time Calculation (min) 45 min    Activity Tolerance Patient tolerated treatment well    Behavior During Therapy WFL for tasks assessed/performed              Past Medical History:  Diagnosis Date   Anemia    low iron   Anxiety state, unspecified    Blood in stool    Chronic airway obstruction, not elsewhere classified    Coronary artery disease    Coronary atherosclerosis of artery bypass graft    Dyspnea    ED (erectile dysfunction)    Esophageal reflux    Gout, unspecified    Osteoarthrosis, unspecified whether generalized or localized, unspecified site    Other and unspecified hyperlipidemia    Other specified disorder of rectum and anus    Personal history of tobacco use, presenting hazards to health    Sleep apnea    does not use cpap   Type II or unspecified type diabetes mellitus without mention of complication, not stated as uncontrolled    Unspecified essential hypertension    Past Surgical History:  Procedure Laterality Date   BIOPSY  09/26/2017   Procedure: BIOPSY;  Surgeon: Meryl Dare, MD;  Location: [facility] ENDOSCOPY;  Service: Endoscopy;;   BIOPSY  02/03/2018   Procedure: BIOPSY;  Surgeon: Lemar Lofty., MD;  Location: [facility] ENDOSCOPY;  Service: Gastroenterology;;   COLONOSCOPY WITH PROPOFOL N/A 09/26/2017   Procedure: COLONOSCOPY WITH PROPOFOL;  Surgeon: Meryl Dare, MD;  Location: [facility] ENDOSCOPY;  Service: Endoscopy;  Laterality: N/A;   CORONARY ARTERY BYPASS GRAFT  97   LIMA to LAD, sequential saphenous vein graft to the first and second diagonal, sequential saphenous vein graft to the intermediate OM and circumflex and SVG to RCA    ESOPHAGOGASTRODUODENOSCOPY (EGD) WITH PROPOFOL N/A 09/26/2017   Procedure: ESOPHAGOGASTRODUODENOSCOPY (EGD) WITH PROPOFOL;  Surgeon: Meryl Dare, MD;  Location: [facility] ENDOSCOPY;  Service: Endoscopy;  Laterality: N/A;   ESOPHAGOGASTRODUODENOSCOPY (EGD) WITH PROPOFOL N/A 02/03/2018   Procedure: ESOPHAGOGASTRODUODENOSCOPY (EGD);  Surgeon: Lemar Lofty., MD;  Location: [facility] ENDOSCOPY;  Service: Gastroenterology;  Laterality: N/A;   KNEE SURGERY     BILATERAL   POLYPECTOMY  09/26/2017   Procedure: POLYPECTOMY;  Surgeon: Meryl Dare, MD;  Location: [facility] ENDOSCOPY;  Service: Endoscopy;;   ROTATOR CUFF REPAIR     SUBMUCOSAL INJECTION  02/03/2018   Procedure: SUBMUCOSAL INJECTION;  Surgeon: Lemar Lofty., MD;  Location: [facility] ENDOSCOPY;  Service: Gastroenterology;;   Patient Active Problem List   Diagnosis Date Noted   Acute gouty arthritis 03/28/2022   Gout flare 01/28/2022   Peripheral neuropathy 01/15/2022   Ataxia 01/15/2022   Type 2 diabetes mellitus (A1c 6.0 on 05/03/2021) with steroid-induced hyperglycemia 05/16/2021   CKD (chronic kidney disease) stage 4, GFR 15-29 ml/min (HCC) 05/15/2021   Olecranon bursitis, right elbow 05/15/2021   Tremor 05/15/2021   Elbow pain, left 12/05/2020   Constipation 12/05/2020   Atherosclerosis of aorta (HCC) 12/05/2020   Chest pain, atypical 12/05/2020   CTS (carpal tunnel syndrome) 03/24/2020   Grief 03/24/2020   Cholelithiasis 09/16/2019   Nausea  09/02/2019   Weight loss 09/02/2019   Elevated troponin 01/12/2019   Hypertensive urgency 01/10/2019   Well adult exam 10/20/2018   Balanitis 10/20/2018   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/27/2017   Adenoma of stomach 12/25/2017   Abnormal findings on esophagogastroduodenoscopy (EGD) 12/25/2017   Sebaceous cyst 10/04/2017   Iron deficiency anemia    Benign neoplasm of sigmoid colon    Benign neoplasm of cecum    Benign neoplasm of transverse colon    Positive occult  stool blood test 09/24/2017   (HFpEF) heart failure with preserved ejection fraction (HCC) 09/24/2017   Hypokalemia 08/17/2015   Generalized weakness 07/22/2015   Gait disorder 03/18/2014   Low back pain 01/12/2014   History of colon polyps 12/17/2013   DM (diabetes mellitus), type 2 with peripheral vascular complications (HCC) 09/16/2013   Diarrhea 10/08/2012   Edema 10/08/2011   DOE (dyspnea on exertion) 06/29/2011   LATERAL EPICONDYLITIS, LEFT 02/20/2010   TOBACCO USE, QUIT 02/23/2009   Cough 02/16/2009   Hearing loss 10/15/2008   ERECTILE DYSFUNCTION 04/30/2008   Coronary artery disease involving native coronary artery of native heart without angina pectoris 04/30/2008   COPD (chronic obstructive pulmonary disease) (HCC) 05/21/2007   Anxiety state 05/20/2007   Insomnia 03/04/2007   Hyperlipidemia LDL goal <70 02/01/2007   Gout attack 02/01/2007   Essential hypertension 02/01/2007   GERD 02/01/2007   Osteoarthritis 02/01/2007    PCP: Macarthur Critchley Plotnikov  REFERRING PROVIDER: [name]  REFERRING DIAG: M25.512 (ICD-10-CM) - Acute pain of left shoulder S46.012A (ICD-10-CM) - Traumatic complete tear of left rotator cuff, initial encounter  THERAPY DIAG:  Acute pain of left shoulder  Stiffness of left shoulder, not elsewhere classified  Muscle weakness (generalized)  Rationale for Evaluation and Treatment: Rehabilitation  ONSET DATE: 07/08/22  SUBJECTIVE:                                                                                                                                                                                      SUBJECTIVE STATEMENT: Feels ok  Hand dominance: Right  PERTINENT HISTORY: Pt is an 82 y/o male c/o L shoulder pain ongoing since Easter Sunday, 3/31. He suffered a fall and then a couple days later noticed bruising and soreness in his L shoulder area. Pt locates pain to all over the L shoulder joint, w/ bruising along the anterior forearm.    PAIN:  Are you having pain? Yes: NPRS scale: 0/10 Pain location: L shoulder Pain description: sore, sharp, ache Aggravating factors: moving it, reaching up and behind me   Relieving factors: Tylenol  PRECAUTIONS: None  WEIGHT BEARING RESTRICTIONS: No  FALLS:  Has patient fallen in last 6 months? Yes. Number of falls "several falls but they didn't hurt me"   LIVING ENVIRONMENT: Lives with: lives alone Lives in: House/apartment Stairs: No Has following equipment at home: Quad cane small base  OCCUPATION: Retired  PLOF: Independent  PATIENT GOALS:get my arm back to normal, no pain  NEXT MD VISIT: 08/13/22  OBJECTIVE:   DIAGNOSTIC FINDINGS:  Diagnostic Limited MSK Ultrasound of: Left shoulder Biceps tendon appears to be intact and bicipital groove surrounded by hypoechoic fluid. Subscapularis tendon difficult to visualize may be complete rotator cuff tear with retraction. Supraspinatus tendon very difficult to visualize suspect full-thickness retracted rotator cuff tear. Infraspinatus tendon appears to be intact. Impression: Suspect full-thickness retracted rotator cuff tear supraspinatus tendon possibly subscapularis tendon.  FINDINGS: No gross fracture. No evidence for shoulder subluxation or dislocation. Degenerative changes are seen in the glenohumeral joint. There is some cortical irregularity along the inferior aspect of the glenoid, likely related to spurring although tiny fracture fragment or intra-articular loose body not excluded.   IMPRESSION: 1. Degenerative changes in the glenohumeral joint. 2. Cortical irregularity along the inferior aspect of the glenoid, likely related to spurring although tiny fracture fragment or intra-articular loose body not excluded.   PATIENT SURVEYS:  FOTO 62  COGNITION: Overall cognitive status: Within functional limits for tasks assessed     SENSATION: WFL  POSTURE: Rounded shoulders, flexed trunk, increased thoracic  kyphosis  UPPER EXTREMITY ROM:   Active ROM Right eval Left eval  Shoulder flexion WFL 70 w/pain  Shoulder extension    Shoulder abduction WFL 65 w/pain  Shoulder adduction    Shoulder internal rotation WFL Can get [facility] but some pain  Shoulder external rotation WFL Can get [facility] but some pain  Elbow flexion    Elbow extension    Wrist flexion    Wrist extension    Wrist ulnar deviation    Wrist radial deviation    Wrist pronation    Wrist supination    (Blank rows = not tested)  UPPER EXTREMITY MMT:  MMT Right eval Left eval  Shoulder flexion  2+  Shoulder extension    Shoulder abduction  2+  Shoulder adduction    Shoulder internal rotation  3+  Shoulder external rotation  3+  Middle trapezius    Lower trapezius    Elbow flexion  5  Elbow extension  5  Wrist flexion    Wrist extension    Wrist ulnar deviation    Wrist radial deviation    Wrist pronation    Wrist supination    Grip strength (lbs)    (Blank rows = not tested)  SHOULDER SPECIAL TESTS: Impingement tests: Hawkins/Kennedy impingement test: positive  and Painful arc test: positive  Rotator cuff assessment: Drop arm test: positive  and Empty can test: positive  Biceps assessment: Speed's test: positive   JOINT MOBILITY TESTING:  Muscle guarding past 90d of abduction, able to get near WNL with shoulder flexion  PALPATION:  Warm and TPP around shoulder joint and RC insertions, bruising in L arm and forearm   TODAY'S TREATMENT:  DATE:  UBE L1 x 3 min each Wall slides 2x10 flexion and abd Shoulder Ext red 2x15 Rows green 2x10 Shoulder ER yellow 2x10 Shoulder Flex 1lb WaTE 2x10 Shoulder abd limited ROM 2x10 PROM to L shoulder Vaso to L shoulder 10 mins, med pressure  07/31/22 Wall slides x10 flexion and abd Red TB rows and ext 2x10 UBE L2 x  forwards/back 1# seated flexion 3x5 Supine shoulder flexion 2x10 Supine chest press 2# 2x10  PROM to L shoulder Vaso to L shoulder 10 mins, med pressure   07/25/22 SUPINE AAROM L shoulder all planes Seated red t band L UE rows Seated red t band L shoulder ext with elbow straight Seated isometric B shoulder ER Seated L bicpes curls 2# Seated red t band B scapular punches(band around trunk) Beach ball squeezes , 15 reps 5 sec holds UBE level 1,  2 min F, 2 min B  07/17/22- EVAL    PATIENT EDUCATION: Education details: POC and HEP Person educated: Patient Education method: Explanation Education comprehension: verbalized understanding  HOME EXERCISE PROGRAM: Access Code: [email or web address] URL: [email or web address] Date: 07/17/2022 Prepared by: Cassie Freer  Exercises - Supine Shoulder Flexion Extension AAROM with Dowel  - 1 x daily - 7 x weekly - 2 sets - 10 reps - Standing Shoulder Abduction AAROM with Dowel  - 1 x daily - 7 x weekly - 3 sets - 10 reps - Shoulder Flexion Wall Slide with Towel  - 1 x daily - 7 x weekly - 2 sets - 10 reps - Standing Shoulder Row with Anchored Resistance  - 1 x daily - 7 x weekly - 2 sets - 10 reps - Shoulder extension with resistance - Neutral  - 1 x daily - 7 x weekly - 2 sets - 10 reps  ASSESSMENT:  CLINICAL IMPRESSION: Pain continues to be improved. All interventions completed well. Pt had the most difficulty with abduction. Some pain with active abduction do he was instructed to limit his ROM. He still has edema and bruising extending down into his L forearm. He is still unsteady on his feet and reports he has a history of falls. Focused on strengthening today as tolerated. Needs verbal and tactile cues throughout session for form.   OBJECTIVE IMPAIRMENTS: decreased balance, decreased ROM, decreased strength, impaired UE functional use, and pain.   ACTIVITY LIMITATIONS: carrying, lifting, dressing, reach over head, and  hygiene/grooming  REHAB POTENTIAL: Good  CLINICAL DECISION MAKING: Stable/uncomplicated  EVALUATION COMPLEXITY: Low  GOALS: Goals reviewed with patient? Yes  SHORT TERM GOALS: Target date: 08/28/22  Patient will be independent with initial HEP.  Goal status: IN PROGRESS   LONG TERM GOALS: Target date: 10/09/22  Patient will be independent with advanced/ongoing HEP to improve outcomes and carryover.  Goal status: INITIAL  2.  Patient will report 75% improvement in L shoulder pain to improve QOL.  Baseline: 6/10 Goal status: INITIAL  3.  Patient to improve L shoulder AROM to [facility] without pain provocation to allow for increased ease of ADLs.  Goal status: INITIAL  4.  Patient will demonstrate improved functional UE strength as demonstrated by 5/5. Baseline: 2+, 3+ Goal status: INITIAL  5.  Patient will report 48 on FOTO (patient outcome measure)  to demonstrate improved functional ability.  Baseline: 62 Goal status: INITIAL  PLAN:  PT FREQUENCY: 2x/week  PT DURATION: 12 weeks  PLANNED INTERVENTIONS: Therapeutic exercises, Therapeutic activity, Neuromuscular re-education, Balance training, Gait training, Patient/Family education, Self Care, Joint mobilization, Electrical stimulation,  Cryotherapy, Moist heat, Vasopneumatic device, Ultrasound, Ionotophoresis 4mg /ml Dexamethasone, and Manual therapy  PLAN FOR NEXT SESSION: AAROM, PROM, light strengthening for L shoulder as tolerated    Grayce Sessions, PTA 08/07/2022, 2:32 PM

## 2022-08-09 ENCOUNTER — Ambulatory Visit: Payer: Medicare Other | Attending: Family Medicine

## 2022-08-09 DIAGNOSIS — M25612 Stiffness of left shoulder, not elsewhere classified: Secondary | ICD-10-CM

## 2022-08-09 DIAGNOSIS — M6281 Muscle weakness (generalized): Secondary | ICD-10-CM

## 2022-08-09 DIAGNOSIS — R2689 Other abnormalities of gait and mobility: Secondary | ICD-10-CM

## 2022-08-09 DIAGNOSIS — M25512 Pain in left shoulder: Secondary | ICD-10-CM

## 2022-08-09 NOTE — Therapy (Signed)
OUTPATIENT PHYSICAL THERAPY SHOULDER TREATMENT   Patient Name: Harold Pittman MRN: [id number] DOB:10/24/1940, 82 y.o., male Today's Date: 08/09/2022  END OF SESSION:  PT End of Session - 08/09/22 1144     Visit Number 5    Date for PT Re-Evaluation 10/09/22    PT Start Time 1145    PT Stop Time 1230    PT Time Calculation (min) 45 min    Activity Tolerance Patient tolerated treatment well    Behavior During Therapy WFL for tasks assessed/performed               Past Medical History:  Diagnosis Date   Anemia    low iron   Anxiety state, unspecified    Blood in stool    Chronic airway obstruction, not elsewhere classified    Coronary artery disease    Coronary atherosclerosis of artery bypass graft    Dyspnea    ED (erectile dysfunction)    Esophageal reflux    Gout, unspecified    Osteoarthrosis, unspecified whether generalized or localized, unspecified site    Other and unspecified hyperlipidemia    Other specified disorder of rectum and anus    Personal history of tobacco use, presenting hazards to health    Sleep apnea    does not use cpap   Type II or unspecified type diabetes mellitus without mention of complication, not stated as uncontrolled    Unspecified essential hypertension    Past Surgical History:  Procedure Laterality Date   BIOPSY  09/26/2017   Procedure: BIOPSY;  Surgeon: Meryl Dare, MD;  Location: [facility] ENDOSCOPY;  Service: Endoscopy;;   BIOPSY  02/03/2018   Procedure: BIOPSY;  Surgeon: Lemar Lofty., MD;  Location: [facility] ENDOSCOPY;  Service: Gastroenterology;;   COLONOSCOPY WITH PROPOFOL N/A 09/26/2017   Procedure: COLONOSCOPY WITH PROPOFOL;  Surgeon: Meryl Dare, MD;  Location: [facility] ENDOSCOPY;  Service: Endoscopy;  Laterality: N/A;   CORONARY ARTERY BYPASS GRAFT  97   LIMA to LAD, sequential saphenous vein graft to the first and second diagonal, sequential saphenous vein graft to the intermediate OM and circumflex and SVG to RCA    ESOPHAGOGASTRODUODENOSCOPY (EGD) WITH PROPOFOL N/A 09/26/2017   Procedure: ESOPHAGOGASTRODUODENOSCOPY (EGD) WITH PROPOFOL;  Surgeon: Meryl Dare, MD;  Location: [facility] ENDOSCOPY;  Service: Endoscopy;  Laterality: N/A;   ESOPHAGOGASTRODUODENOSCOPY (EGD) WITH PROPOFOL N/A 02/03/2018   Procedure: ESOPHAGOGASTRODUODENOSCOPY (EGD);  Surgeon: Lemar Lofty., MD;  Location: [facility] ENDOSCOPY;  Service: Gastroenterology;  Laterality: N/A;   KNEE SURGERY     BILATERAL   POLYPECTOMY  09/26/2017   Procedure: POLYPECTOMY;  Surgeon: Meryl Dare, MD;  Location: [facility] ENDOSCOPY;  Service: Endoscopy;;   ROTATOR CUFF REPAIR     SUBMUCOSAL INJECTION  02/03/2018   Procedure: SUBMUCOSAL INJECTION;  Surgeon: Lemar Lofty., MD;  Location: [facility] ENDOSCOPY;  Service: Gastroenterology;;   Patient Active Problem List   Diagnosis Date Noted   Acute gouty arthritis 03/28/2022   Gout flare 01/28/2022   Peripheral neuropathy 01/15/2022   Ataxia 01/15/2022   Type 2 diabetes mellitus (A1c 6.0 on 05/03/2021) with steroid-induced hyperglycemia 05/16/2021   CKD (chronic kidney disease) stage 4, GFR 15-29 ml/min (HCC) 05/15/2021   Olecranon bursitis, right elbow 05/15/2021   Tremor 05/15/2021   Elbow pain, left 12/05/2020   Constipation 12/05/2020   Atherosclerosis of aorta (HCC) 12/05/2020   Chest pain, atypical 12/05/2020   CTS (carpal tunnel syndrome) 03/24/2020   Grief 03/24/2020   Cholelithiasis 09/16/2019  Nausea 09/02/2019   Weight loss 09/02/2019   Elevated troponin 01/12/2019   Hypertensive urgency 01/10/2019   Well adult exam 10/20/2018   Balanitis 10/20/2018   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/27/2017   Adenoma of stomach 12/25/2017   Abnormal findings on esophagogastroduodenoscopy (EGD) 12/25/2017   Sebaceous cyst 10/04/2017   Iron deficiency anemia    Benign neoplasm of sigmoid colon    Benign neoplasm of cecum    Benign neoplasm of transverse colon    Positive occult  stool blood test 09/24/2017   (HFpEF) heart failure with preserved ejection fraction (HCC) 09/24/2017   Hypokalemia 08/17/2015   Generalized weakness 07/22/2015   Gait disorder 03/18/2014   Low back pain 01/12/2014   History of colon polyps 12/17/2013   DM (diabetes mellitus), type 2 with peripheral vascular complications (HCC) 09/16/2013   Diarrhea 10/08/2012   Edema 10/08/2011   DOE (dyspnea on exertion) 06/29/2011   LATERAL EPICONDYLITIS, LEFT 02/20/2010   TOBACCO USE, QUIT 02/23/2009   Cough 02/16/2009   Hearing loss 10/15/2008   ERECTILE DYSFUNCTION 04/30/2008   Coronary artery disease involving native coronary artery of native heart without angina pectoris 04/30/2008   COPD (chronic obstructive pulmonary disease) (HCC) 05/21/2007   Anxiety state 05/20/2007   Insomnia 03/04/2007   Hyperlipidemia LDL goal <70 02/01/2007   Gout attack 02/01/2007   Essential hypertension 02/01/2007   GERD 02/01/2007   Osteoarthritis 02/01/2007    PCP: Macarthur Critchley Plotnikov  REFERRING PROVIDER: [name]  REFERRING DIAG: M25.512 (ICD-10-CM) - Acute pain of left shoulder S46.012A (ICD-10-CM) - Traumatic complete tear of left rotator cuff, initial encounter  THERAPY DIAG:  Muscle weakness (generalized)  Acute pain of left shoulder  Stiffness of left shoulder, not elsewhere classified  Other abnormalities of gait and mobility  Rationale for Evaluation and Treatment: Rehabilitation  ONSET DATE: 07/08/22  SUBJECTIVE:                                                                                                                                                                                      SUBJECTIVE STATEMENT: Shoulder feels okay, I'm not having any pain unless I move it a certain way   Hand dominance: Right  PERTINENT HISTORY: Pt is an 82 y/o male c/o L shoulder pain ongoing since Easter Sunday, 3/31. He suffered a fall and then a couple days later noticed bruising and soreness in  his L shoulder area. Pt locates pain to all over the L shoulder joint, w/ bruising along the anterior forearm.   PAIN:  Are you having pain? Yes: NPRS scale: 0/10 Pain location: L shoulder Pain description: sore, sharp, ache Aggravating factors: moving  it, reaching up and behind me   Relieving factors: Tylenol  PRECAUTIONS: None  WEIGHT BEARING RESTRICTIONS: No  FALLS:  Has patient fallen in last 6 months? Yes. Number of falls "several falls but they didn't hurt me"   LIVING ENVIRONMENT: Lives with: lives alone Lives in: House/apartment Stairs: No Has following equipment at home: Quad cane small base  OCCUPATION: Retired  PLOF: Independent  PATIENT GOALS:get my arm back to normal, no pain  NEXT MD VISIT: 08/13/22  OBJECTIVE:   DIAGNOSTIC FINDINGS:  Diagnostic Limited MSK Ultrasound of: Left shoulder Biceps tendon appears to be intact and bicipital groove surrounded by hypoechoic fluid. Subscapularis tendon difficult to visualize may be complete rotator cuff tear with retraction. Supraspinatus tendon very difficult to visualize suspect full-thickness retracted rotator cuff tear. Infraspinatus tendon appears to be intact. Impression: Suspect full-thickness retracted rotator cuff tear supraspinatus tendon possibly subscapularis tendon.  FINDINGS: No gross fracture. No evidence for shoulder subluxation or dislocation. Degenerative changes are seen in the glenohumeral joint. There is some cortical irregularity along the inferior aspect of the glenoid, likely related to spurring although tiny fracture fragment or intra-articular loose body not excluded.   IMPRESSION: 1. Degenerative changes in the glenohumeral joint. 2. Cortical irregularity along the inferior aspect of the glenoid, likely related to spurring although tiny fracture fragment or intra-articular loose body not excluded.   PATIENT SURVEYS:  FOTO 62  COGNITION: Overall cognitive status: Within functional  limits for tasks assessed     SENSATION: WFL  POSTURE: Rounded shoulders, flexed trunk, increased thoracic kyphosis  UPPER EXTREMITY ROM:   Active ROM Right eval Left eval  Shoulder flexion WFL 70 w/pain  Shoulder extension    Shoulder abduction WFL 65 w/pain  Shoulder adduction    Shoulder internal rotation WFL Can get [facility] but some pain  Shoulder external rotation WFL Can get [facility] but some pain  Elbow flexion    Elbow extension    Wrist flexion    Wrist extension    Wrist ulnar deviation    Wrist radial deviation    Wrist pronation    Wrist supination    (Blank rows = not tested)  UPPER EXTREMITY MMT:  MMT Right eval Left eval  Shoulder flexion  2+  Shoulder extension    Shoulder abduction  2+  Shoulder adduction    Shoulder internal rotation  3+  Shoulder external rotation  3+  Middle trapezius    Lower trapezius    Elbow flexion  5  Elbow extension  5  Wrist flexion    Wrist extension    Wrist ulnar deviation    Wrist radial deviation    Wrist pronation    Wrist supination    Grip strength (lbs)    (Blank rows = not tested)  SHOULDER SPECIAL TESTS: Impingement tests: Hawkins/Kennedy impingement test: positive  and Painful arc test: positive  Rotator cuff assessment: Drop arm test: positive  and Empty can test: positive  Biceps assessment: Speed's test: positive   JOINT MOBILITY TESTING:  Muscle guarding past 90d of abduction, able to get near WNL with shoulder flexion  PALPATION:  Warm and TPP around shoulder joint and RC insertions, bruising in L arm and forearm   TODAY'S TREATMENT:  DATE:  08/09/22 UBE L3 x3 mins each way  Finger ladder x5  OHP yellow ball seated 2x10  Seated rows green 2x10 Seated ER/IR with band green 2x10  Horizontal abd red 2x10  Standing shoulder flexion 2# 2x10  Standing shoulder ext  behind the back 2# 2x10 Standing IR up back 2# 2x10  Bicep curls 3# 2x10 PROM to L shoulder    08/07/22 UBE L1 x 3 min each Wall slides 2x10 flexion and abd Shoulder Ext red 2x15 Rows green 2x10 Shoulder ER yellow 2x10 Shoulder Flex 1lb WaTE 2x10 Shoulder abd limited ROM 2x10 PROM to L shoulder Vaso to L shoulder 10 mins, med pressure  07/31/22 Wall slides x10 flexion and abd Red TB rows and ext 2x10 UBE L2 x forwards/back 1# seated flexion 3x5 Supine shoulder flexion 2x10 Supine chest press 2# 2x10  PROM to L shoulder Vaso to L shoulder 10 mins, med pressure   07/25/22 SUPINE AAROM L shoulder all planes Seated red t band L UE rows Seated red t band L shoulder ext with elbow straight Seated isometric B shoulder ER Seated L bicpes curls 2# Seated red t band B scapular punches(band around trunk) Beach ball squeezes , 15 reps 5 sec holds UBE level 1,  2 min F, 2 min B  07/17/22- EVAL    PATIENT EDUCATION: Education details: POC and HEP Person educated: Patient Education method: Explanation Education comprehension: verbalized understanding  HOME EXERCISE PROGRAM: Access Code: [email or web address] URL: [email or web address] Date: 07/17/2022 Prepared by: Cassie Freer  Exercises - Supine Shoulder Flexion Extension AAROM with Dowel  - 1 x daily - 7 x weekly - 2 sets - 10 reps - Standing Shoulder Abduction AAROM with Dowel  - 1 x daily - 7 x weekly - 3 sets - 10 reps - Shoulder Flexion Wall Slide with Towel  - 1 x daily - 7 x weekly - 2 sets - 10 reps - Standing Shoulder Row with Anchored Resistance  - 1 x daily - 7 x weekly - 2 sets - 10 reps - Shoulder extension with resistance - Neutral  - 1 x daily - 7 x weekly - 2 sets - 10 reps  ASSESSMENT:  CLINICAL IMPRESSION: Pain continues to be improved. All interventions completed well. He is still unsteady on his feet and reports he has a history of falls so we tried mostly seated exercises. With standing  exercises, needs minA and support at hips to prevent from falling. Focused on strengthening today as tolerated. Needs verbal and tactile cues throughout session for form and to slow down.   OBJECTIVE IMPAIRMENTS: decreased balance, decreased ROM, decreased strength, impaired UE functional use, and pain.   ACTIVITY LIMITATIONS: carrying, lifting, dressing, reach over head, and hygiene/grooming  REHAB POTENTIAL: Good  CLINICAL DECISION MAKING: Stable/uncomplicated  EVALUATION COMPLEXITY: Low  GOALS: Goals reviewed with patient? Yes  SHORT TERM GOALS: Target date: 08/28/22  Patient will be independent with initial HEP.  Goal status: IN PROGRESS   LONG TERM GOALS: Target date: 10/09/22  Patient will be independent with advanced/ongoing HEP to improve outcomes and carryover.  Goal status: INITIAL  2.  Patient will report 75% improvement in L shoulder pain to improve QOL.  Baseline: 6/10 Goal status: INITIAL  3.  Patient to improve L shoulder AROM to [facility] without pain provocation to allow for increased ease of ADLs.  Goal status: INITIAL  4.  Patient will demonstrate improved functional UE strength as demonstrated by 5/5. Baseline: 2+, 3+ Goal  status: INITIAL  5.  Patient will report 49 on FOTO (patient outcome measure)  to demonstrate improved functional ability.  Baseline: 62 Goal status: INITIAL  PLAN:  PT FREQUENCY: 2x/week  PT DURATION: 12 weeks  PLANNED INTERVENTIONS: Therapeutic exercises, Therapeutic activity, Neuromuscular re-education, Balance training, Gait training, Patient/Family education, Self Care, Joint mobilization, Electrical stimulation, Cryotherapy, Moist heat, Vasopneumatic device, Ultrasound, Ionotophoresis 4mg /ml Dexamethasone, and Manual therapy  PLAN FOR NEXT SESSION: AAROM, PROM, light strengthening for L shoulder as tolerated    Cassie Freer, PT 08/09/2022, 12:31 PM

## 2022-08-13 ENCOUNTER — Ambulatory Visit: Payer: Medicare Other | Admitting: Family Medicine

## 2022-08-13 NOTE — Progress Notes (Deleted)
   Rubin Payor, PhD, LAT, ATC acting as a scribe for [name], MD.  Harold Pittman is a 82 y.o. male who presents to [facility] at [facility] today for 20-month f/u L shoulder pain ongoing since fall on Easter Sunday. Injury is concerning for a full-thickness RC tear. Pt was last seen by Dr. Denyse Amass on 07/16/22 and was referred to PT, completing 5 visits. Today, pt reports ***  Dx imaging: 07/16/22 L shoulder XR   Pertinent review of systems: ***  Relevant historical information: ***   Exam:  There were no vitals taken for this visit. General: Well Developed, well nourished, and in no acute distress.   MSK: ***    Lab and Radiology Results No results found for this or any previous visit (from the past 72 hour(s)). No results found.     Assessment and Plan: 82 y.o. male with ***   PDMP not reviewed this encounter. No orders of the defined types were placed in this encounter.  No orders of the defined types were placed in this encounter.    Discussed warning signs or symptoms. Please see discharge instructions. Patient expresses understanding.   ***

## 2022-08-14 ENCOUNTER — Ambulatory Visit: Payer: Medicare Other | Admitting: Physical Therapy

## 2022-08-16 ENCOUNTER — Ambulatory Visit: Payer: Medicare Other | Admitting: Physical Therapy

## 2022-08-21 ENCOUNTER — Ambulatory Visit: Payer: Medicare Other

## 2022-08-23 ENCOUNTER — Ambulatory Visit: Payer: Medicare Other

## 2022-08-28 ENCOUNTER — Ambulatory Visit: Payer: Medicare Other | Admitting: Physical Therapy

## 2022-08-28 ENCOUNTER — Encounter: Payer: Self-pay | Admitting: Physical Therapy

## 2022-08-28 DIAGNOSIS — R2689 Other abnormalities of gait and mobility: Secondary | ICD-10-CM | POA: Diagnosis not present

## 2022-08-28 DIAGNOSIS — M25512 Pain in left shoulder: Secondary | ICD-10-CM | POA: Diagnosis not present

## 2022-08-28 DIAGNOSIS — M6281 Muscle weakness (generalized): Secondary | ICD-10-CM

## 2022-08-28 DIAGNOSIS — M25612 Stiffness of left shoulder, not elsewhere classified: Secondary | ICD-10-CM | POA: Diagnosis not present

## 2022-08-28 NOTE — Therapy (Signed)
OUTPATIENT PHYSICAL THERAPY SHOULDER TREATMENT   Patient Name: Harold Pittman MRN: [id number] DOB:Jul 02, 1940, 82 y.o., male Today's Date: 08/28/2022  END OF SESSION:  PT End of Session - 08/28/22 1144     Visit Number 6    Date for PT Re-Evaluation 10/09/22    PT Start Time 1145    PT Stop Time 1230    PT Time Calculation (min) 45 min    Activity Tolerance Patient tolerated treatment well    Behavior During Therapy WFL for tasks assessed/performed               Past Medical History:  Diagnosis Date   Anemia    low iron   Anxiety state, unspecified    Blood in stool    Chronic airway obstruction, not elsewhere classified    Coronary artery disease    Coronary atherosclerosis of artery bypass graft    Dyspnea    ED (erectile dysfunction)    Esophageal reflux    Gout, unspecified    Osteoarthrosis, unspecified whether generalized or localized, unspecified site    Other and unspecified hyperlipidemia    Other specified disorder of rectum and anus    Personal history of tobacco use, presenting hazards to health    Sleep apnea    does not use cpap   Type II or unspecified type diabetes mellitus without mention of complication, not stated as uncontrolled    Unspecified essential hypertension    Past Surgical History:  Procedure Laterality Date   BIOPSY  09/26/2017   Procedure: BIOPSY;  Surgeon: Meryl Dare, MD;  Location: [facility] ENDOSCOPY;  Service: Endoscopy;;   BIOPSY  02/03/2018   Procedure: BIOPSY;  Surgeon: Lemar Lofty., MD;  Location: [facility] ENDOSCOPY;  Service: Gastroenterology;;   COLONOSCOPY WITH PROPOFOL N/A 09/26/2017   Procedure: COLONOSCOPY WITH PROPOFOL;  Surgeon: Meryl Dare, MD;  Location: [facility] ENDOSCOPY;  Service: Endoscopy;  Laterality: N/A;   CORONARY ARTERY BYPASS GRAFT  97   LIMA to LAD, sequential saphenous vein graft to the first and second diagonal, sequential saphenous vein graft to the intermediate OM and circumflex and SVG to RCA    ESOPHAGOGASTRODUODENOSCOPY (EGD) WITH PROPOFOL N/A 09/26/2017   Procedure: ESOPHAGOGASTRODUODENOSCOPY (EGD) WITH PROPOFOL;  Surgeon: Meryl Dare, MD;  Location: [facility] ENDOSCOPY;  Service: Endoscopy;  Laterality: N/A;   ESOPHAGOGASTRODUODENOSCOPY (EGD) WITH PROPOFOL N/A 02/03/2018   Procedure: ESOPHAGOGASTRODUODENOSCOPY (EGD);  Surgeon: Lemar Lofty., MD;  Location: [facility] ENDOSCOPY;  Service: Gastroenterology;  Laterality: N/A;   KNEE SURGERY     BILATERAL   POLYPECTOMY  09/26/2017   Procedure: POLYPECTOMY;  Surgeon: Meryl Dare, MD;  Location: [facility] ENDOSCOPY;  Service: Endoscopy;;   ROTATOR CUFF REPAIR     SUBMUCOSAL INJECTION  02/03/2018   Procedure: SUBMUCOSAL INJECTION;  Surgeon: Lemar Lofty., MD;  Location: [facility] ENDOSCOPY;  Service: Gastroenterology;;   Patient Active Problem List   Diagnosis Date Noted   Acute gouty arthritis 03/28/2022   Gout flare 01/28/2022   Peripheral neuropathy 01/15/2022   Ataxia 01/15/2022   Type 2 diabetes mellitus (A1c 6.0 on 05/03/2021) with steroid-induced hyperglycemia 05/16/2021   CKD (chronic kidney disease) stage 4, GFR 15-29 ml/min (HCC) 05/15/2021   Olecranon bursitis, right elbow 05/15/2021   Tremor 05/15/2021   Elbow pain, left 12/05/2020   Constipation 12/05/2020   Atherosclerosis of aorta (HCC) 12/05/2020   Chest pain, atypical 12/05/2020   CTS (carpal tunnel syndrome) 03/24/2020   Grief 03/24/2020   Cholelithiasis 09/16/2019  Nausea 09/02/2019   Weight loss 09/02/2019   Elevated troponin 01/12/2019   Hypertensive urgency 01/10/2019   Well adult exam 10/20/2018   Balanitis 10/20/2018   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/27/2017   Adenoma of stomach 12/25/2017   Abnormal findings on esophagogastroduodenoscopy (EGD) 12/25/2017   Sebaceous cyst 10/04/2017   Iron deficiency anemia    Benign neoplasm of sigmoid colon    Benign neoplasm of cecum    Benign neoplasm of transverse colon    Positive  occult stool blood test 09/24/2017   (HFpEF) heart failure with preserved ejection fraction (HCC) 09/24/2017   Hypokalemia 08/17/2015   Generalized weakness 07/22/2015   Gait disorder 03/18/2014   Low back pain 01/12/2014   History of colon polyps 12/17/2013   DM (diabetes mellitus), type 2 with peripheral vascular complications (HCC) 09/16/2013   Diarrhea 10/08/2012   Edema 10/08/2011   DOE (dyspnea on exertion) 06/29/2011   LATERAL EPICONDYLITIS, LEFT 02/20/2010   TOBACCO USE, QUIT 02/23/2009   Cough 02/16/2009   Hearing loss 10/15/2008   ERECTILE DYSFUNCTION 04/30/2008   Coronary artery disease involving native coronary artery of native heart without angina pectoris 04/30/2008   COPD (chronic obstructive pulmonary disease) (HCC) 05/21/2007   Anxiety state 05/20/2007   Insomnia 03/04/2007   Hyperlipidemia LDL goal <70 02/01/2007   Gout attack 02/01/2007   Essential hypertension 02/01/2007   GERD 02/01/2007   Osteoarthritis 02/01/2007    PCP: Macarthur Critchley Plotnikov  REFERRING PROVIDER: [name]  REFERRING DIAG: M25.512 (ICD-10-CM) - Acute pain of left shoulder S46.012A (ICD-10-CM) - Traumatic complete tear of left rotator cuff, initial encounter  THERAPY DIAG:  Muscle weakness (generalized)  Stiffness of left shoulder, not elsewhere classified  Acute pain of left shoulder  Rationale for Evaluation and Treatment: Rehabilitation  ONSET DATE: 07/08/22  SUBJECTIVE:                                                                                                                                                                                      SUBJECTIVE STATEMENT: "Im doing good, pain is their but I can move any way I want too"  Hand dominance: Right  PERTINENT HISTORY: Pt is an 82 y/o male c/o L shoulder pain ongoing since Easter Sunday, 3/31. He suffered a fall and then a couple days later noticed bruising and soreness in his L shoulder area. Pt locates pain to all over  the L shoulder joint, w/ bruising along the anterior forearm.   PAIN:  Are you having pain? Yes: NPRS scale: 0/10 Pain location: L shoulder Pain description: sore, sharp, ache Aggravating factors: moving it, reaching up and behind me  Relieving factors: Tylenol  PRECAUTIONS: None  WEIGHT BEARING RESTRICTIONS: No  FALLS:  Has patient fallen in last 6 months? Yes. Number of falls "several falls but they didn't hurt me"   LIVING ENVIRONMENT: Lives with: lives alone Lives in: House/apartment Stairs: No Has following equipment at home: Quad cane small base  OCCUPATION: Retired  PLOF: Independent  PATIENT GOALS:get my arm back to normal, no pain  NEXT MD VISIT: 08/13/22  OBJECTIVE:   DIAGNOSTIC FINDINGS:  Diagnostic Limited MSK Ultrasound of: Left shoulder Biceps tendon appears to be intact and bicipital groove surrounded by hypoechoic fluid. Subscapularis tendon difficult to visualize may be complete rotator cuff tear with retraction. Supraspinatus tendon very difficult to visualize suspect full-thickness retracted rotator cuff tear. Infraspinatus tendon appears to be intact. Impression: Suspect full-thickness retracted rotator cuff tear supraspinatus tendon possibly subscapularis tendon.  FINDINGS: No gross fracture. No evidence for shoulder subluxation or dislocation. Degenerative changes are seen in the glenohumeral joint. There is some cortical irregularity along the inferior aspect of the glenoid, likely related to spurring although tiny fracture fragment or intra-articular loose body not excluded.   IMPRESSION: 1. Degenerative changes in the glenohumeral joint. 2. Cortical irregularity along the inferior aspect of the glenoid, likely related to spurring although tiny fracture fragment or intra-articular loose body not excluded.   PATIENT SURVEYS:  FOTO 62  COGNITION: Overall cognitive status: Within functional limits for tasks  assessed     SENSATION: WFL  POSTURE: Rounded shoulders, flexed trunk, increased thoracic kyphosis  UPPER EXTREMITY ROM:   Active ROM Right eval Left eval  Shoulder flexion WFL 70 w/pain  Shoulder extension    Shoulder abduction WFL 65 w/pain  Shoulder adduction    Shoulder internal rotation WFL Can get [facility] but some pain  Shoulder external rotation WFL Can get [facility] but some pain  Elbow flexion    Elbow extension    Wrist flexion    Wrist extension    Wrist ulnar deviation    Wrist radial deviation    Wrist pronation    Wrist supination    (Blank rows = not tested)  UPPER EXTREMITY MMT:  MMT Right eval Left eval  Shoulder flexion  2+  Shoulder extension    Shoulder abduction  2+  Shoulder adduction    Shoulder internal rotation  3+  Shoulder external rotation  3+  Middle trapezius    Lower trapezius    Elbow flexion  5  Elbow extension  5  Wrist flexion    Wrist extension    Wrist ulnar deviation    Wrist radial deviation    Wrist pronation    Wrist supination    Grip strength (lbs)    (Blank rows = not tested)  SHOULDER SPECIAL TESTS: Impingement tests: Hawkins/Kennedy impingement test: positive  and Painful arc test: positive  Rotator cuff assessment: Drop arm test: positive  and Empty can test: positive  Biceps assessment: Speed's test: positive   JOINT MOBILITY TESTING:  Muscle guarding past 90d of abduction, able to get near WNL with shoulder flexion  PALPATION:  Warm and TPP around shoulder joint and RC insertions, bruising in L arm and forearm   TODAY'S TREATMENT:  DATE:  08/28/22 AAROM flex, Ext, IR x10  OHP yellow ball seated 2x10  UBE L3 x3 mins each way  Rows & Lats 20lb 2x10  Chest press 5lb 2x10 Triceps ext 20lb 2x15 Shoulder ER Red 2x10 Shoulder IR yellow w2x10   08/09/22 UBE L3 x3 mins each way   Finger ladder x5  OHP yellow ball seated 2x10  Seated rows green 2x10 Seated ER/IR with band green 2x10  Horizontal abd red 2x10  Standing shoulder flexion 2# 2x10  Standing shoulder ext behind the back 2# 2x10 Standing IR up back 2# 2x10  Bicep curls 3# 2x10 PROM to L shoulder    08/07/22 UBE L1 x 3 min each Wall slides 2x10 flexion and abd Shoulder Ext red 2x15 Rows green 2x10 Shoulder ER yellow 2x10 Shoulder Flex 1lb WaTE 2x10 Shoulder abd limited ROM 2x10 PROM to L shoulder Vaso to L shoulder 10 mins, med pressure  07/31/22 Wall slides x10 flexion and abd Red TB rows and ext 2x10 UBE L2 x forwards/back 1# seated flexion 3x5 Supine shoulder flexion 2x10 Supine chest press 2# 2x10  PROM to L shoulder Vaso to L shoulder 10 mins, med pressure   07/25/22 SUPINE AAROM L shoulder all planes Seated red t band L UE rows Seated red t band L shoulder ext with elbow straight Seated isometric B shoulder ER Seated L bicpes curls 2# Seated red t band B scapular punches(band around trunk) Beach ball squeezes , 15 reps 5 sec holds UBE level 1,  2 min F, 2 min B  07/17/22- EVAL    PATIENT EDUCATION: Education details: POC and HEP Person educated: Patient Education method: Explanation Education comprehension: verbalized understanding  HOME EXERCISE PROGRAM: Access Code: [email or web address] URL: [email or web address] Date: 07/17/2022 Prepared by: Cassie Freer  Exercises - Supine Shoulder Flexion Extension AAROM with Dowel  - 1 x daily - 7 x weekly - 2 sets - 10 reps - Standing Shoulder Abduction AAROM with Dowel  - 1 x daily - 7 x weekly - 3 sets - 10 reps - Shoulder Flexion Wall Slide with Towel  - 1 x daily - 7 x weekly - 2 sets - 10 reps - Standing Shoulder Row with Anchored Resistance  - 1 x daily - 7 x weekly - 2 sets - 10 reps - Shoulder extension with resistance - Neutral  - 1 x daily - 7 x weekly - 2 sets - 10 reps  ASSESSMENT:  CLINICAL  IMPRESSION: Pt returns to therapy after a few weeks due to a death in his family. He is still unsteady on his feet and reports he has a history of falls so SBA provided with AAROM and shoulder Ext. Focused on strengthening today as tolerated. Needs verbal and tactile cues throughout session for form and to slow down.   OBJECTIVE IMPAIRMENTS: decreased balance, decreased ROM, decreased strength, impaired UE functional use, and pain.   ACTIVITY LIMITATIONS: carrying, lifting, dressing, reach over head, and hygiene/grooming  REHAB POTENTIAL: Good  CLINICAL DECISION MAKING: Stable/uncomplicated  EVALUATION COMPLEXITY: Low  GOALS: Goals reviewed with patient? Yes  SHORT TERM GOALS: Target date: 08/28/22  Patient will be independent with initial HEP.  Goal status: Met 08/27/20  LONG TERM GOALS: Target date: 10/09/22  Patient will be independent with advanced/ongoing HEP to improve outcomes and carryover.  Goal status: INITIAL  2.  Patient will report 75% improvement in L shoulder pain to improve QOL.  Baseline: 6/10 Goal status: Met 08/28/22  3.  Patient  to improve L shoulder AROM to [facility] without pain provocation to allow for increased ease of ADLs.  Goal status: INITIAL  4.  Patient will demonstrate improved functional UE strength as demonstrated by 5/5. Baseline: 2+, 3+ Goal status: INITIAL  5.  Patient will report 57 on FOTO (patient outcome measure)  to demonstrate improved functional ability.  Baseline: 62 Goal status: INITIAL  PLAN:  PT FREQUENCY: 2x/week  PT DURATION: 12 weeks  PLANNED INTERVENTIONS: Therapeutic exercises, Therapeutic activity, Neuromuscular re-education, Balance training, Gait training, Patient/Family education, Self Care, Joint mobilization, Electrical stimulation, Cryotherapy, Moist heat, Vasopneumatic device, Ultrasound, Ionotophoresis 4mg /ml Dexamethasone, and Manual therapy  PLAN FOR NEXT SESSION: AAROM, PROM, light strengthening for L shoulder as  tolerated    Grayce Sessions, PTA 08/28/2022, 11:44 AM

## 2022-08-29 NOTE — Therapy (Signed)
OUTPATIENT PHYSICAL THERAPY SHOULDER TREATMENT   Patient Name: Harold Pittman MRN: [id number] DOB:[birth year], 82 y.o., male Today's Date: 08/30/2022  END OF SESSION:  PT End of Session - 08/30/22 1141     Visit Number 7    Date for PT Re-Evaluation 10/09/22    PT Start Time 1145    PT Stop Time 1230    PT Time Calculation (min) 45 min    Activity Tolerance Patient tolerated treatment well    Behavior During Therapy WFL for tasks assessed/performed               Past Medical History:  Diagnosis Date   Anemia    low iron   Anxiety state, unspecified    Blood in stool    Chronic airway obstruction, not elsewhere classified    Coronary artery disease    Coronary atherosclerosis of artery bypass graft    Dyspnea    ED (erectile dysfunction)    Esophageal reflux    Gout, unspecified    Osteoarthrosis, unspecified whether generalized or localized, unspecified site    Other and unspecified hyperlipidemia    Other specified disorder of rectum and anus    Personal history of tobacco use, presenting hazards to health    Sleep apnea    does not use cpap   Type II or unspecified type diabetes mellitus without mention of complication, not stated as uncontrolled    Unspecified essential hypertension    Past Surgical History:  Procedure Laterality Date   BIOPSY  09/26/2017   Procedure: BIOPSY;  Surgeon: Meryl Dare, MD;  Location: [facility] ENDOSCOPY;  Service: Endoscopy;;   BIOPSY  02/03/2018   Procedure: BIOPSY;  Surgeon: Lemar Lofty., MD;  Location: [facility] ENDOSCOPY;  Service: Gastroenterology;;   COLONOSCOPY WITH PROPOFOL N/A 09/26/2017   Procedure: COLONOSCOPY WITH PROPOFOL;  Surgeon: Meryl Dare, MD;  Location: [facility] ENDOSCOPY;  Service: Endoscopy;  Laterality: N/A;   CORONARY ARTERY BYPASS GRAFT  97   LIMA to LAD, sequential saphenous vein graft to the first and second diagonal, sequential saphenous vein graft to the intermediate OM and circumflex and SVG to RCA    ESOPHAGOGASTRODUODENOSCOPY (EGD) WITH PROPOFOL N/A 09/26/2017   Procedure: ESOPHAGOGASTRODUODENOSCOPY (EGD) WITH PROPOFOL;  Surgeon: Meryl Dare, MD;  Location: [facility] ENDOSCOPY;  Service: Endoscopy;  Laterality: N/A;   ESOPHAGOGASTRODUODENOSCOPY (EGD) WITH PROPOFOL N/A 02/03/2018   Procedure: ESOPHAGOGASTRODUODENOSCOPY (EGD);  Surgeon: Lemar Lofty., MD;  Location: [facility] ENDOSCOPY;  Service: Gastroenterology;  Laterality: N/A;   KNEE SURGERY     BILATERAL   POLYPECTOMY  09/26/2017   Procedure: POLYPECTOMY;  Surgeon: Meryl Dare, MD;  Location: [facility] ENDOSCOPY;  Service: Endoscopy;;   ROTATOR CUFF REPAIR     SUBMUCOSAL INJECTION  02/03/2018   Procedure: SUBMUCOSAL INJECTION;  Surgeon: Lemar Lofty., MD;  Location: [facility] ENDOSCOPY;  Service: Gastroenterology;;   Patient Active Problem List   Diagnosis Date Noted   Acute gouty arthritis 03/28/2022   Gout flare 01/28/2022   Peripheral neuropathy 01/15/2022   Ataxia 01/15/2022   Type 2 diabetes mellitus (A1c 6.0 on 05/03/2021) with steroid-induced hyperglycemia 05/16/2021   CKD (chronic kidney disease) stage 4, GFR 15-29 ml/min (HCC) 05/15/2021   Olecranon bursitis, right elbow 05/15/2021   Tremor 05/15/2021   Elbow pain, left 12/05/2020   Constipation 12/05/2020   Atherosclerosis of aorta (HCC) 12/05/2020   Chest pain, atypical 12/05/2020   CTS (carpal tunnel syndrome) 03/24/2020   Grief 03/24/2020   Cholelithiasis 09/16/2019  Nausea 09/02/2019   Weight loss 09/02/2019   Elevated troponin 01/12/2019   Hypertensive urgency 01/10/2019   Well adult exam 10/20/2018   Balanitis 10/20/2018   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/27/2017   Adenoma of stomach 12/25/2017   Abnormal findings on esophagogastroduodenoscopy (EGD) 12/25/2017   Sebaceous cyst 10/04/2017   Iron deficiency anemia    Benign neoplasm of sigmoid colon    Benign neoplasm of cecum    Benign neoplasm of transverse colon    Positive  occult stool blood test 09/24/2017   (HFpEF) heart failure with preserved ejection fraction (HCC) 09/24/2017   Hypokalemia 08/17/2015   Generalized weakness 07/22/2015   Gait disorder 03/18/2014   Low back pain 01/12/2014   History of colon polyps 12/17/2013   DM (diabetes mellitus), type 2 with peripheral vascular complications (HCC) 09/16/2013   Diarrhea 10/08/2012   Edema 10/08/2011   DOE (dyspnea on exertion) 06/29/2011   LATERAL EPICONDYLITIS, LEFT 02/20/2010   TOBACCO USE, QUIT 02/23/2009   Cough 02/16/2009   Hearing loss 10/15/2008   ERECTILE DYSFUNCTION 04/30/2008   Coronary artery disease involving native coronary artery of native heart without angina pectoris 04/30/2008   COPD (chronic obstructive pulmonary disease) (HCC) 05/21/2007   Anxiety state 05/20/2007   Insomnia 03/04/2007   Hyperlipidemia LDL goal <70 02/01/2007   Gout attack 02/01/2007   Essential hypertension 02/01/2007   GERD 02/01/2007   Osteoarthritis 02/01/2007    PCP: Macarthur Critchley Plotnikov  REFERRING PROVIDER: [name]  REFERRING DIAG: M25.512 (ICD-10-CM) - Acute pain of left shoulder S46.012A (ICD-10-CM) - Traumatic complete tear of left rotator cuff, initial encounter  THERAPY DIAG:  Muscle weakness (generalized)  Stiffness of left shoulder, not elsewhere classified  Acute pain of left shoulder  Other abnormalities of gait and mobility  Rationale for Evaluation and Treatment: Rehabilitation  ONSET DATE: 07/08/22  SUBJECTIVE:                                                                                                                                                                                      SUBJECTIVE STATEMENT: Shoulder is doing a whole lot better, get pain every once in a while.   Hand dominance: Right  PERTINENT HISTORY: Pt is an 82 y/o male c/o L shoulder pain ongoing since Easter Sunday, 3/31. He suffered a fall and then a couple days later noticed bruising and soreness  in his L shoulder area. Pt locates pain to all over the L shoulder joint, w/ bruising along the anterior forearm.   PAIN:  Are you having pain? Yes: NPRS scale: 0/10 Pain location: L shoulder Pain description: sore, sharp, ache Aggravating factors: moving it,  reaching up and behind me   Relieving factors: Tylenol  PRECAUTIONS: None  WEIGHT BEARING RESTRICTIONS: No  FALLS:  Has patient fallen in last 6 months? Yes. Number of falls "several falls but they didn't hurt me"   LIVING ENVIRONMENT: Lives with: lives alone Lives in: House/apartment Stairs: No Has following equipment at home: Quad cane small base  OCCUPATION: Retired  PLOF: Independent  PATIENT GOALS:get my arm back to normal, no pain  NEXT MD VISIT: 08/13/22  OBJECTIVE:   DIAGNOSTIC FINDINGS:  Diagnostic Limited MSK Ultrasound of: Left shoulder Biceps tendon appears to be intact and bicipital groove surrounded by hypoechoic fluid. Subscapularis tendon difficult to visualize may be complete rotator cuff tear with retraction. Supraspinatus tendon very difficult to visualize suspect full-thickness retracted rotator cuff tear. Infraspinatus tendon appears to be intact. Impression: Suspect full-thickness retracted rotator cuff tear supraspinatus tendon possibly subscapularis tendon.  FINDINGS: No gross fracture. No evidence for shoulder subluxation or dislocation. Degenerative changes are seen in the glenohumeral joint. There is some cortical irregularity along the inferior aspect of the glenoid, likely related to spurring although tiny fracture fragment or intra-articular loose body not excluded.   IMPRESSION: 1. Degenerative changes in the glenohumeral joint. 2. Cortical irregularity along the inferior aspect of the glenoid, likely related to spurring although tiny fracture fragment or intra-articular loose body not excluded.   PATIENT SURVEYS:  FOTO 62  COGNITION: Overall cognitive status: Within functional  limits for tasks assessed     SENSATION: WFL  POSTURE: Rounded shoulders, flexed trunk, increased thoracic kyphosis  UPPER EXTREMITY ROM:   Active ROM Right eval Left eval  Shoulder flexion WFL 70 w/pain  Shoulder extension    Shoulder abduction WFL 65 w/pain  Shoulder adduction    Shoulder internal rotation WFL Can get [facility] but some pain  Shoulder external rotation WFL Can get [facility] but some pain  Elbow flexion    Elbow extension    Wrist flexion    Wrist extension    Wrist ulnar deviation    Wrist radial deviation    Wrist pronation    Wrist supination    (Blank rows = not tested)  UPPER EXTREMITY MMT:  MMT Right eval Left eval  Shoulder flexion  2+  Shoulder extension    Shoulder abduction  2+  Shoulder adduction    Shoulder internal rotation  3+  Shoulder external rotation  3+  Middle trapezius    Lower trapezius    Elbow flexion  5  Elbow extension  5  Wrist flexion    Wrist extension    Wrist ulnar deviation    Wrist radial deviation    Wrist pronation    Wrist supination    Grip strength (lbs)    (Blank rows = not tested)  SHOULDER SPECIAL TESTS: Impingement tests: Hawkins/Kennedy impingement test: positive  and Painful arc test: positive  Rotator cuff assessment: Drop arm test: positive  and Empty can test: positive  Biceps assessment: Speed's test: positive   JOINT MOBILITY TESTING:  Muscle guarding past 90d of abduction, able to get near WNL with shoulder flexion  PALPATION:  Warm and TPP around shoulder joint and RC insertions, bruising in L arm and forearm   TODAY'S TREATMENT:  DATE:  08/30/22 PROM seated in all directions  Shoulder ER Red 2x10 Shoulder IR Red 2x10  NuStep L5 x77mins  Rows and Lats 25# 2x10  Chest press 5# x10, x8 Bicep curls 10# 2x10 Tricep ext 20# 2x10 Horizontal abd red  2x10 Shoulder flexion and abd raises 1# 2x5   08/28/22 AAROM flex, Ext, IR x10  OHP yellow ball seated 2x10  UBE L3 x3 mins each way  Rows & Lats 20lb 2x10  Chest press 5lb 2x10 Triceps ext 20lb 2x15 Shoulder ER Red 2x10 Shoulder IR yellow w2x10   08/09/22 UBE L3 x3 mins each way  Finger ladder x5  OHP yellow ball seated 2x10  Seated rows green 2x10 Seated ER/IR with band green 2x10  Horizontal abd red 2x10  Standing shoulder flexion 2# 2x10  Standing shoulder ext behind the back 2# 2x10 Standing IR up back 2# 2x10  Bicep curls 3# 2x10 PROM to L shoulder    08/07/22 UBE L1 x 3 min each Wall slides 2x10 flexion and abd Shoulder Ext red 2x15 Rows green 2x10 Shoulder ER yellow 2x10 Shoulder Flex 1lb WaTE 2x10 Shoulder abd limited ROM 2x10 PROM to L shoulder Vaso to L shoulder 10 mins, med pressure  07/31/22 Wall slides x10 flexion and abd Red TB rows and ext 2x10 UBE L2 x forwards/back 1# seated flexion 3x5 Supine shoulder flexion 2x10 Supine chest press 2# 2x10  PROM to L shoulder Vaso to L shoulder 10 mins, med pressure   07/25/22 SUPINE AAROM L shoulder all planes Seated red t band L UE rows Seated red t band L shoulder ext with elbow straight Seated isometric B shoulder ER Seated L bicpes curls 2# Seated red t band B scapular punches(band around trunk) Beach ball squeezes , 15 reps 5 sec holds UBE level 1,  2 min F, 2 min B  07/17/22- EVAL    PATIENT EDUCATION: Education details: POC and HEP Person educated: Patient Education method: Explanation Education comprehension: verbalized understanding  HOME EXERCISE PROGRAM: Access Code: [email or web address] URL: [email or web address] Date: 07/17/2022 Prepared by: Cassie Freer  Exercises - Supine Shoulder Flexion Extension AAROM with Dowel  - 1 x daily - 7 x weekly - 2 sets - 10 reps - Standing Shoulder Abduction AAROM with Dowel  - 1 x daily - 7 x weekly - 3 sets - 10 reps - Shoulder Flexion Wall  Slide with Towel  - 1 x daily - 7 x weekly - 2 sets - 10 reps - Standing Shoulder Row with Anchored Resistance  - 1 x daily - 7 x weekly - 2 sets - 10 reps - Shoulder extension with resistance - Neutral  - 1 x daily - 7 x weekly - 2 sets - 10 reps  ASSESSMENT:  CLINICAL IMPRESSION: He has made good progress with ROM, able to get close to full ranges passively. Focused on strengthening today as tolerated.  Needs verbal and tactile cues throughout session for form and to slow down. Most difficulty with chest press today, muscle fatigue noted after 5-6 reps at 5#.   OBJECTIVE IMPAIRMENTS: decreased balance, decreased ROM, decreased strength, impaired UE functional use, and pain.   ACTIVITY LIMITATIONS: carrying, lifting, dressing, reach over head, and hygiene/grooming  REHAB POTENTIAL: Good  CLINICAL DECISION MAKING: Stable/uncomplicated  EVALUATION COMPLEXITY: Low  GOALS: Goals reviewed with patient? Yes  SHORT TERM GOALS: Target date: 08/28/22  Patient will be independent with initial HEP.  Goal status: Met 08/27/20  LONG TERM GOALS: Target date:  10/09/22  Patient will be independent with advanced/ongoing HEP to improve outcomes and carryover.  Goal status: INITIAL  2.  Patient will report 75% improvement in L shoulder pain to improve QOL.  Baseline: 6/10 Goal status: Met 08/28/22  3.  Patient to improve L shoulder AROM to [facility] without pain provocation to allow for increased ease of ADLs.  Goal status: INITIAL  4.  Patient will demonstrate improved functional UE strength as demonstrated by 5/5. Baseline: 2+, 3+ Goal status: INITIAL  5.  Patient will report 82 on FOTO (patient outcome measure)  to demonstrate improved functional ability.  Baseline: 62 Goal status: INITIAL  PLAN:  PT FREQUENCY: 2x/week  PT DURATION: 12 weeks  PLANNED INTERVENTIONS: Therapeutic exercises, Therapeutic activity, Neuromuscular re-education, Balance training, Gait training, Patient/Family  education, Self Care, Joint mobilization, Electrical stimulation, Cryotherapy, Moist heat, Vasopneumatic device, Ultrasound, Ionotophoresis 4mg /ml Dexamethasone, and Manual therapy  PLAN FOR NEXT SESSION: AAROM, PROM, light strengthening for L shoulder as tolerated    Cassie Freer, PT 08/30/2022, 12:24 PM

## 2022-08-30 ENCOUNTER — Ambulatory Visit: Payer: Medicare Other

## 2022-08-30 DIAGNOSIS — M6281 Muscle weakness (generalized): Secondary | ICD-10-CM | POA: Diagnosis not present

## 2022-08-30 DIAGNOSIS — R2689 Other abnormalities of gait and mobility: Secondary | ICD-10-CM

## 2022-08-30 DIAGNOSIS — M25612 Stiffness of left shoulder, not elsewhere classified: Secondary | ICD-10-CM | POA: Diagnosis not present

## 2022-08-30 DIAGNOSIS — M25512 Pain in left shoulder: Secondary | ICD-10-CM | POA: Diagnosis not present

## 2022-09-04 ENCOUNTER — Ambulatory Visit: Payer: Medicare Other | Admitting: Physical Therapy

## 2022-09-04 ENCOUNTER — Encounter: Payer: Self-pay | Admitting: Physical Therapy

## 2022-09-04 DIAGNOSIS — M6281 Muscle weakness (generalized): Secondary | ICD-10-CM | POA: Diagnosis not present

## 2022-09-04 DIAGNOSIS — M25512 Pain in left shoulder: Secondary | ICD-10-CM

## 2022-09-04 DIAGNOSIS — M25612 Stiffness of left shoulder, not elsewhere classified: Secondary | ICD-10-CM | POA: Diagnosis not present

## 2022-09-04 DIAGNOSIS — R2689 Other abnormalities of gait and mobility: Secondary | ICD-10-CM | POA: Diagnosis not present

## 2022-09-04 NOTE — Therapy (Signed)
OUTPATIENT PHYSICAL THERAPY SHOULDER TREATMENT   Patient Name: Harold Pittman MRN: [id number] DOB:1941-01-01, 82 y.o., male Today's Date: 09/04/2022  END OF SESSION:  PT End of Session - 09/04/22 1143     Visit Number 8    Date for PT Re-Evaluation 10/09/22    PT Start Time 1145    PT Stop Time 1230    PT Time Calculation (min) 45 min    Activity Tolerance Patient tolerated treatment well    Behavior During Therapy WFL for tasks assessed/performed               Past Medical History:  Diagnosis Date   Anemia    low iron   Anxiety state, unspecified    Blood in stool    Chronic airway obstruction, not elsewhere classified    Coronary artery disease    Coronary atherosclerosis of artery bypass graft    Dyspnea    ED (erectile dysfunction)    Esophageal reflux    Gout, unspecified    Osteoarthrosis, unspecified whether generalized or localized, unspecified site    Other and unspecified hyperlipidemia    Other specified disorder of rectum and anus    Personal history of tobacco use, presenting hazards to health    Sleep apnea    does not use cpap   Type II or unspecified type diabetes mellitus without mention of complication, not stated as uncontrolled    Unspecified essential hypertension    Past Surgical History:  Procedure Laterality Date   BIOPSY  09/26/2017   Procedure: BIOPSY;  Surgeon: Meryl Dare, MD;  Location: [facility] ENDOSCOPY;  Service: Endoscopy;;   BIOPSY  02/03/2018   Procedure: BIOPSY;  Surgeon: Lemar Lofty., MD;  Location: [facility] ENDOSCOPY;  Service: Gastroenterology;;   COLONOSCOPY WITH PROPOFOL N/A 09/26/2017   Procedure: COLONOSCOPY WITH PROPOFOL;  Surgeon: Meryl Dare, MD;  Location: [facility] ENDOSCOPY;  Service: Endoscopy;  Laterality: N/A;   CORONARY ARTERY BYPASS GRAFT  97   LIMA to LAD, sequential saphenous vein graft to the first and second diagonal, sequential saphenous vein graft to the intermediate OM and circumflex and SVG to RCA    ESOPHAGOGASTRODUODENOSCOPY (EGD) WITH PROPOFOL N/A 09/26/2017   Procedure: ESOPHAGOGASTRODUODENOSCOPY (EGD) WITH PROPOFOL;  Surgeon: Meryl Dare, MD;  Location: [facility] ENDOSCOPY;  Service: Endoscopy;  Laterality: N/A;   ESOPHAGOGASTRODUODENOSCOPY (EGD) WITH PROPOFOL N/A 02/03/2018   Procedure: ESOPHAGOGASTRODUODENOSCOPY (EGD);  Surgeon: Lemar Lofty., MD;  Location: [facility] ENDOSCOPY;  Service: Gastroenterology;  Laterality: N/A;   KNEE SURGERY     BILATERAL   POLYPECTOMY  09/26/2017   Procedure: POLYPECTOMY;  Surgeon: Meryl Dare, MD;  Location: [facility] ENDOSCOPY;  Service: Endoscopy;;   ROTATOR CUFF REPAIR     SUBMUCOSAL INJECTION  02/03/2018   Procedure: SUBMUCOSAL INJECTION;  Surgeon: Lemar Lofty., MD;  Location: [facility] ENDOSCOPY;  Service: Gastroenterology;;   Patient Active Problem List   Diagnosis Date Noted   Acute gouty arthritis 03/28/2022   Gout flare 01/28/2022   Peripheral neuropathy 01/15/2022   Ataxia 01/15/2022   Type 2 diabetes mellitus (A1c 6.0 on 05/03/2021) with steroid-induced hyperglycemia 05/16/2021   CKD (chronic kidney disease) stage 4, GFR 15-29 ml/min (HCC) 05/15/2021   Olecranon bursitis, right elbow 05/15/2021   Tremor 05/15/2021   Elbow pain, left 12/05/2020   Constipation 12/05/2020   Atherosclerosis of aorta (HCC) 12/05/2020   Chest pain, atypical 12/05/2020   CTS (carpal tunnel syndrome) 03/24/2020   Grief 03/24/2020   Cholelithiasis 09/16/2019  Nausea 09/02/2019   Weight loss 09/02/2019   Elevated troponin 01/12/2019   Hypertensive urgency 01/10/2019   Well adult exam 10/20/2018   Balanitis 10/20/2018   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/27/2017   Adenoma of stomach 12/25/2017   Abnormal findings on esophagogastroduodenoscopy (EGD) 12/25/2017   Sebaceous cyst 10/04/2017   Iron deficiency anemia    Benign neoplasm of sigmoid colon    Benign neoplasm of cecum    Benign neoplasm of transverse colon    Positive  occult stool blood test 09/24/2017   (HFpEF) heart failure with preserved ejection fraction (HCC) 09/24/2017   Hypokalemia 08/17/2015   Generalized weakness 07/22/2015   Gait disorder 03/18/2014   Low back pain 01/12/2014   History of colon polyps 12/17/2013   DM (diabetes mellitus), type 2 with peripheral vascular complications (HCC) 09/16/2013   Diarrhea 10/08/2012   Edema 10/08/2011   DOE (dyspnea on exertion) 06/29/2011   LATERAL EPICONDYLITIS, LEFT 02/20/2010   TOBACCO USE, QUIT 02/23/2009   Cough 02/16/2009   Hearing loss 10/15/2008   ERECTILE DYSFUNCTION 04/30/2008   Coronary artery disease involving native coronary artery of native heart without angina pectoris 04/30/2008   COPD (chronic obstructive pulmonary disease) (HCC) 05/21/2007   Anxiety state 05/20/2007   Insomnia 03/04/2007   Hyperlipidemia LDL goal <70 02/01/2007   Gout attack 02/01/2007   Essential hypertension 02/01/2007   GERD 02/01/2007   Osteoarthritis 02/01/2007    PCP: Macarthur Critchley Plotnikov  REFERRING PROVIDER: [name]  REFERRING DIAG: M25.512 (ICD-10-CM) - Acute pain of left shoulder S46.012A (ICD-10-CM) - Traumatic complete tear of left rotator cuff, initial encounter  THERAPY DIAG:  Muscle weakness (generalized)  Stiffness of left shoulder, not elsewhere classified  Acute pain of left shoulder  Other abnormalities of gait and mobility  Rationale for Evaluation and Treatment: Rehabilitation  ONSET DATE: 07/08/22  SUBJECTIVE:                                                                                                                                                                                      SUBJECTIVE STATEMENT: "I feel good, I really do"  Hand dominance: Right  PERTINENT HISTORY: Pt is an 82 y/o male c/o L shoulder pain ongoing since Easter Sunday, 3/31. He suffered a fall and then a couple days later noticed bruising and soreness in his L shoulder area. Pt locates pain to  all over the L shoulder joint, w/ bruising along the anterior forearm.   PAIN:  Are you having pain? Yes: NPRS scale: 0/10 Pain location: L shoulder Pain description: sore, sharp, ache Aggravating factors: moving it, reaching up and behind me   Relieving factors:  Tylenol  PRECAUTIONS: None  WEIGHT BEARING RESTRICTIONS: No  FALLS:  Has patient fallen in last 6 months? Yes. Number of falls "several falls but they didn't hurt me"   LIVING ENVIRONMENT: Lives with: lives alone Lives in: House/apartment Stairs: No Has following equipment at home: Quad cane small base  OCCUPATION: Retired  PLOF: Independent  PATIENT GOALS:get my arm back to normal, no pain  NEXT MD VISIT: 08/13/22  OBJECTIVE:   DIAGNOSTIC FINDINGS:  Diagnostic Limited MSK Ultrasound of: Left shoulder Biceps tendon appears to be intact and bicipital groove surrounded by hypoechoic fluid. Subscapularis tendon difficult to visualize may be complete rotator cuff tear with retraction. Supraspinatus tendon very difficult to visualize suspect full-thickness retracted rotator cuff tear. Infraspinatus tendon appears to be intact. Impression: Suspect full-thickness retracted rotator cuff tear supraspinatus tendon possibly subscapularis tendon.  FINDINGS: No gross fracture. No evidence for shoulder subluxation or dislocation. Degenerative changes are seen in the glenohumeral joint. There is some cortical irregularity along the inferior aspect of the glenoid, likely related to spurring although tiny fracture fragment or intra-articular loose body not excluded.   IMPRESSION: 1. Degenerative changes in the glenohumeral joint. 2. Cortical irregularity along the inferior aspect of the glenoid, likely related to spurring although tiny fracture fragment or intra-articular loose body not excluded.   PATIENT SURVEYS:  FOTO 62  COGNITION: Overall cognitive status: Within functional limits for tasks  assessed     SENSATION: WFL  POSTURE: Rounded shoulders, flexed trunk, increased thoracic kyphosis  UPPER EXTREMITY ROM:   Active ROM Right eval Left eval Left  09/04/22  Shoulder flexion WFL 70 w/pain 135  Shoulder extension     Shoulder abduction WFL 65 w/pain 120  Shoulder adduction     Shoulder internal rotation WFL Can get [facility] but some pain   Shoulder external rotation WFL Can get [facility] but some pain   Elbow flexion     Elbow extension     Wrist flexion     Wrist extension     Wrist ulnar deviation     Wrist radial deviation     Wrist pronation     Wrist supination     (Blank rows = not tested)  UPPER EXTREMITY MMT:  MMT Right eval Left eval  Shoulder flexion  2+  Shoulder extension    Shoulder abduction  2+  Shoulder adduction    Shoulder internal rotation  3+  Shoulder external rotation  3+  Middle trapezius    Lower trapezius    Elbow flexion  5  Elbow extension  5  Wrist flexion    Wrist extension    Wrist ulnar deviation    Wrist radial deviation    Wrist pronation    Wrist supination    Grip strength (lbs)    (Blank rows = not tested)  SHOULDER SPECIAL TESTS: Impingement tests: Hawkins/Kennedy impingement test: positive  and Painful arc test: positive  Rotator cuff assessment: Drop arm test: positive  and Empty can test: positive  Biceps assessment: Speed's test: positive   JOINT MOBILITY TESTING:  Muscle guarding past 90d of abduction, able to get near WNL with shoulder flexion  PALPATION:  Warm and TPP around shoulder joint and RC insertions, bruising in L arm and forearm   TODAY'S TREATMENT:  DATE:  09/04/22 NuStep L 5 x6 min UBE L 2 x 3 min Shoulder flex 1lb 2x10  LUE shoulder ER yellow 2x15 LUE IR yellow 2x12 Chest press 5lb 2x10 Shoulder Ext 5lb 2x10  08/30/22 PROM seated in all directions   Shoulder ER Red 2x10 Shoulder IR Red 2x10  NuStep L5 x35mins  Rows and Lats 25# 2x10  Chest press 5# x10, x8 Bicep curls 10# 2x10 Tricep ext 20# 2x10 Horizontal abd red 2x10 Shoulder flexion and abd raises 1# 2x5   08/28/22 AAROM flex, Ext, IR x10  OHP yellow ball seated 2x10  UBE L3 x3 mins each way  Rows & Lats 20lb 2x10  Chest press 5lb 2x10 Triceps ext 20lb 2x15 Shoulder ER Red 2x10 Shoulder IR yellow w2x10   08/09/22 UBE L3 x3 mins each way  Finger ladder x5  OHP yellow ball seated 2x10  Seated rows green 2x10 Seated ER/IR with band green 2x10  Horizontal abd red 2x10  Standing shoulder flexion 2# 2x10  Standing shoulder ext behind the back 2# 2x10 Standing IR up back 2# 2x10  Bicep curls 3# 2x10 PROM to L shoulder    08/07/22 UBE L1 x 3 min each Wall slides 2x10 flexion and abd Shoulder Ext red 2x15 Rows green 2x10 Shoulder ER yellow 2x10 Shoulder Flex 1lb WaTE 2x10 Shoulder abd limited ROM 2x10 PROM to L shoulder Vaso to L shoulder 10 mins, med pressure  07/31/22 Wall slides x10 flexion and abd Red TB rows and ext 2x10 UBE L2 x forwards/back 1# seated flexion 3x5 Supine shoulder flexion 2x10 Supine chest press 2# 2x10  PROM to L shoulder Vaso to L shoulder 10 mins, med pressure   07/25/22 SUPINE AAROM L shoulder all planes Seated red t band L UE rows Seated red t band L shoulder ext with elbow straight Seated isometric B shoulder ER Seated L bicpes curls 2# Seated red t band B scapular punches(band around trunk) Beach ball squeezes , 15 reps 5 sec holds UBE level 1,  2 min F, 2 min B  07/17/22- EVAL    PATIENT EDUCATION: Education details: POC and HEP Person educated: Patient Education method: Explanation Education comprehension: verbalized understanding  HOME EXERCISE PROGRAM: Access Code: [email or web address] URL: [email or web address] Date: 07/17/2022 Prepared by: Cassie Freer  Exercises - Supine Shoulder Flexion Extension  AAROM with Dowel  - 1 x daily - 7 x weekly - 2 sets - 10 reps - Standing Shoulder Abduction AAROM with Dowel  - 1 x daily - 7 x weekly - 3 sets - 10 reps - Shoulder Flexion Wall Slide with Towel  - 1 x daily - 7 x weekly - 2 sets - 10 reps - Standing Shoulder Row with Anchored Resistance  - 1 x daily - 7 x weekly - 2 sets - 10 reps - Shoulder extension with resistance - Neutral  - 1 x daily - 7 x weekly - 2 sets - 10 reps  ASSESSMENT:  CLINICAL IMPRESSION: Pt has progressed increasing his L shoulder AROM. Some pain and weakness with resisted abduction and internal rotation.   Focused on strengthening today as tolerated.  Needs verbal and tactile cues throughout session for form and to slow down. Pt continues to fatigue quick with chest press.   OBJECTIVE IMPAIRMENTS: decreased balance, decreased ROM, decreased strength, impaired UE functional use, and pain.   ACTIVITY LIMITATIONS: carrying, lifting, dressing, reach over head, and hygiene/grooming  REHAB POTENTIAL: Good  CLINICAL DECISION MAKING: Stable/uncomplicated  EVALUATION COMPLEXITY:  Low  GOALS: Goals reviewed with patient? Yes  SHORT TERM GOALS: Target date: 08/28/22  Patient will be independent with initial HEP.  Goal status: Met 08/27/20  LONG TERM GOALS: Target date: 10/09/22  Patient will be independent with advanced/ongoing HEP to improve outcomes and carryover.  Goal status: INITIAL  2.  Patient will report 75% improvement in L shoulder pain to improve QOL.  Baseline: 6/10 Goal status: Met 08/28/22  3.  Patient to improve L shoulder AROM to [facility] without pain provocation to allow for increased ease of ADLs.  Goal status: Progressing 09/04/22  4.  Patient will demonstrate improved functional UE strength as demonstrated by 5/5. Baseline: 2+, 3+ Goal status: INITIAL  5.  Patient will report 12 on FOTO (patient outcome measure)  to demonstrate improved functional ability.  Baseline: 62 Goal status:  INITIAL  PLAN:  PT FREQUENCY: 2x/week  PT DURATION: 12 weeks  PLANNED INTERVENTIONS: Therapeutic exercises, Therapeutic activity, Neuromuscular re-education, Balance training, Gait training, Patient/Family education, Self Care, Joint mobilization, Electrical stimulation, Cryotherapy, Moist heat, Vasopneumatic device, Ultrasound, Ionotophoresis 4mg /ml Dexamethasone, and Manual therapy  PLAN FOR NEXT SESSION: AAROM, PROM, light strengthening for L shoulder as tolerated    Grayce Sessions, PTA 09/04/2022, 11:44 AM

## 2022-09-05 NOTE — Therapy (Signed)
OUTPATIENT PHYSICAL THERAPY SHOULDER TREATMENT   Patient Name: Harold Pittman MRN: [id number] DOB:January 19, 1941, 82 y.o., male Today's Date: 09/04/2022  END OF SESSION:  PT End of Session - 09/04/22 1143     Visit Number 8    Date for PT Re-Evaluation 10/09/22    PT Start Time 1145    PT Stop Time 1230    PT Time Calculation (min) 45 min    Activity Tolerance Patient tolerated treatment well    Behavior During Therapy WFL for tasks assessed/performed               Past Medical History:  Diagnosis Date   Anemia    low iron   Anxiety state, unspecified    Blood in stool    Chronic airway obstruction, not elsewhere classified    Coronary artery disease    Coronary atherosclerosis of artery bypass graft    Dyspnea    ED (erectile dysfunction)    Esophageal reflux    Gout, unspecified    Osteoarthrosis, unspecified whether generalized or localized, unspecified site    Other and unspecified hyperlipidemia    Other specified disorder of rectum and anus    Personal history of tobacco use, presenting hazards to health    Sleep apnea    does not use cpap   Type II or unspecified type diabetes mellitus without mention of complication, not stated as uncontrolled    Unspecified essential hypertension    Past Surgical History:  Procedure Laterality Date   BIOPSY  09/26/2017   Procedure: BIOPSY;  Surgeon: Meryl Dare, MD;  Location: [facility] ENDOSCOPY;  Service: Endoscopy;;   BIOPSY  02/03/2018   Procedure: BIOPSY;  Surgeon: Lemar Lofty., MD;  Location: [facility] ENDOSCOPY;  Service: Gastroenterology;;   COLONOSCOPY WITH PROPOFOL N/A 09/26/2017   Procedure: COLONOSCOPY WITH PROPOFOL;  Surgeon: Meryl Dare, MD;  Location: [facility] ENDOSCOPY;  Service: Endoscopy;  Laterality: N/A;   CORONARY ARTERY BYPASS GRAFT  97   LIMA to LAD, sequential saphenous vein graft to the first and second diagonal, sequential saphenous vein graft to the intermediate OM and circumflex and SVG to RCA    ESOPHAGOGASTRODUODENOSCOPY (EGD) WITH PROPOFOL N/A 09/26/2017   Procedure: ESOPHAGOGASTRODUODENOSCOPY (EGD) WITH PROPOFOL;  Surgeon: Meryl Dare, MD;  Location: [facility] ENDOSCOPY;  Service: Endoscopy;  Laterality: N/A;   ESOPHAGOGASTRODUODENOSCOPY (EGD) WITH PROPOFOL N/A 02/03/2018   Procedure: ESOPHAGOGASTRODUODENOSCOPY (EGD);  Surgeon: Lemar Lofty., MD;  Location: [facility] ENDOSCOPY;  Service: Gastroenterology;  Laterality: N/A;   KNEE SURGERY     BILATERAL   POLYPECTOMY  09/26/2017   Procedure: POLYPECTOMY;  Surgeon: Meryl Dare, MD;  Location: [facility] ENDOSCOPY;  Service: Endoscopy;;   ROTATOR CUFF REPAIR     SUBMUCOSAL INJECTION  02/03/2018   Procedure: SUBMUCOSAL INJECTION;  Surgeon: Lemar Lofty., MD;  Location: [facility] ENDOSCOPY;  Service: Gastroenterology;;   Patient Active Problem List   Diagnosis Date Noted   Acute gouty arthritis 03/28/2022   Gout flare 01/28/2022   Peripheral neuropathy 01/15/2022   Ataxia 01/15/2022   Type 2 diabetes mellitus (A1c 6.0 on 05/03/2021) with steroid-induced hyperglycemia 05/16/2021   CKD (chronic kidney disease) stage 4, GFR 15-29 ml/min (HCC) 05/15/2021   Olecranon bursitis, right elbow 05/15/2021   Tremor 05/15/2021   Elbow pain, left 12/05/2020   Constipation 12/05/2020   Atherosclerosis of aorta (HCC) 12/05/2020   Chest pain, atypical 12/05/2020   CTS (carpal tunnel syndrome) 03/24/2020   Grief 03/24/2020   Cholelithiasis 09/16/2019  Nausea 09/02/2019   Weight loss 09/02/2019   Elevated troponin 01/12/2019   Hypertensive urgency 01/10/2019   Well adult exam 10/20/2018   Balanitis 10/20/2018   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/27/2017   Adenoma of stomach 12/25/2017   Abnormal findings on esophagogastroduodenoscopy (EGD) 12/25/2017   Sebaceous cyst 10/04/2017   Iron deficiency anemia    Benign neoplasm of sigmoid colon    Benign neoplasm of cecum    Benign neoplasm of transverse colon    Positive  occult stool blood test 09/24/2017   (HFpEF) heart failure with preserved ejection fraction (HCC) 09/24/2017   Hypokalemia 08/17/2015   Generalized weakness 07/22/2015   Gait disorder 03/18/2014   Low back pain 01/12/2014   History of colon polyps 12/17/2013   DM (diabetes mellitus), type 2 with peripheral vascular complications (HCC) 09/16/2013   Diarrhea 10/08/2012   Edema 10/08/2011   DOE (dyspnea on exertion) 06/29/2011   LATERAL EPICONDYLITIS, LEFT 02/20/2010   TOBACCO USE, QUIT 02/23/2009   Cough 02/16/2009   Hearing loss 10/15/2008   ERECTILE DYSFUNCTION 04/30/2008   Coronary artery disease involving native coronary artery of native heart without angina pectoris 04/30/2008   COPD (chronic obstructive pulmonary disease) (HCC) 05/21/2007   Anxiety state 05/20/2007   Insomnia 03/04/2007   Hyperlipidemia LDL goal <70 02/01/2007   Gout attack 02/01/2007   Essential hypertension 02/01/2007   GERD 02/01/2007   Osteoarthritis 02/01/2007    PCP: Macarthur Critchley Plotnikov  REFERRING PROVIDER: [name]  REFERRING DIAG: M25.512 (ICD-10-CM) - Acute pain of left shoulder S46.012A (ICD-10-CM) - Traumatic complete tear of left rotator cuff, initial encounter  THERAPY DIAG:  Muscle weakness (generalized)  Stiffness of left shoulder, not elsewhere classified  Acute pain of left shoulder  Other abnormalities of gait and mobility  Rationale for Evaluation and Treatment: Rehabilitation  ONSET DATE: 07/08/22  SUBJECTIVE:                                                                                                                                                                                      SUBJECTIVE STATEMENT: I feel good, I am going to try one more visit. Shoulder hurts just a little bit but its not bad.   Hand dominance: Right  PERTINENT HISTORY: Pt is an 82 y/o male c/o L shoulder pain ongoing since Easter Sunday, 3/31. He suffered a fall and then a couple days later  noticed bruising and soreness in his L shoulder area. Pt locates pain to all over the L shoulder joint, w/ bruising along the anterior forearm.   PAIN:  Are you having pain? Yes: NPRS scale: 0/10 Pain location: L shoulder Pain description:  sore, sharp, ache Aggravating factors: moving it, reaching up and behind me   Relieving factors: Tylenol  PRECAUTIONS: None  WEIGHT BEARING RESTRICTIONS: No  FALLS:  Has patient fallen in last 6 months? Yes. Number of falls "several falls but they didn't hurt me"   LIVING ENVIRONMENT: Lives with: lives alone Lives in: House/apartment Stairs: No Has following equipment at home: Quad cane small base  OCCUPATION: Retired  PLOF: Independent  PATIENT GOALS:get my arm back to normal, no pain  NEXT MD VISIT: 08/13/22  OBJECTIVE:   DIAGNOSTIC FINDINGS:  Diagnostic Limited MSK Ultrasound of: Left shoulder Biceps tendon appears to be intact and bicipital groove surrounded by hypoechoic fluid. Subscapularis tendon difficult to visualize may be complete rotator cuff tear with retraction. Supraspinatus tendon very difficult to visualize suspect full-thickness retracted rotator cuff tear. Infraspinatus tendon appears to be intact. Impression: Suspect full-thickness retracted rotator cuff tear supraspinatus tendon possibly subscapularis tendon.  FINDINGS: No gross fracture. No evidence for shoulder subluxation or dislocation. Degenerative changes are seen in the glenohumeral joint. There is some cortical irregularity along the inferior aspect of the glenoid, likely related to spurring although tiny fracture fragment or intra-articular loose body not excluded.   IMPRESSION: 1. Degenerative changes in the glenohumeral joint. 2. Cortical irregularity along the inferior aspect of the glenoid, likely related to spurring although tiny fracture fragment or intra-articular loose body not excluded.   PATIENT SURVEYS:  FOTO 62  COGNITION: Overall  cognitive status: Within functional limits for tasks assessed     SENSATION: WFL  POSTURE: Rounded shoulders, flexed trunk, increased thoracic kyphosis  UPPER EXTREMITY ROM:   Active ROM Right eval Left eval Left  09/04/22  Shoulder flexion WFL 70 w/pain 135  Shoulder extension     Shoulder abduction WFL 65 w/pain 120  Shoulder adduction     Shoulder internal rotation WFL Can get [facility] but some pain   Shoulder external rotation WFL Can get [place] but some pain   Elbow flexion     Elbow extension     Wrist flexion     Wrist extension     Wrist ulnar deviation     Wrist radial deviation     Wrist pronation     Wrist supination     (Blank rows = not tested)  UPPER EXTREMITY MMT:  MMT Right eval Left eval Left  09/06/22  Shoulder flexion  2+ 2+  Shoulder extension     Shoulder abduction  2+ 2+  Shoulder adduction     Shoulder internal rotation  3+ 4-  Shoulder external rotation  3+ 4-  Middle trapezius     Lower trapezius     Elbow flexion  5   Elbow extension  5   Wrist flexion     Wrist extension     Wrist ulnar deviation     Wrist radial deviation     Wrist pronation     Wrist supination     Grip strength (lbs)     (Blank rows = not tested)  SHOULDER SPECIAL TESTS: Impingement tests: Hawkins/Kennedy impingement test: positive  and Painful arc test: positive  Rotator cuff assessment: Drop arm test: positive  and Empty can test: positive  Biceps assessment: Speed's test: positive   JOINT MOBILITY TESTING:  Muscle guarding past 90d of abduction, able to get near WNL with shoulder flexion  PALPATION:  Warm and TPP around shoulder joint and RC insertions, bruising in L arm and forearm   TODAY'S TREATMENT:  DATE:  09/06/22 NuStep L5 x57mins  Shoulder ext 5# x10, 10# x10 Bicep curls 15# 2x10 Tricep ext 20# 2x10  OHP yellow 2x10   ER/IR red band 2x10  Shoulder flexion 1# 2x10 PROM all directions  Supine chest press 2# 2x10 SA punches x10   09/04/22 NuStep L 5 x6 min UBE L 2 x 3 min Shoulder flex 1lb 2x10  LUE shoulder ER yellow 2x15 LUE IR yellow 2x12 Chest press 5lb 2x10 Shoulder Ext 5lb 2x10  08/30/22 PROM seated in all directions  Shoulder ER Red 2x10 Shoulder IR Red 2x10  NuStep L5 x40mins  Rows and Lats 25# 2x10  Chest press 5# x10, x8 Bicep curls 10# 2x10 Tricep ext 20# 2x10 Horizontal abd red 2x10 Shoulder flexion and abd raises 1# 2x5   08/28/22 AAROM flex, Ext, IR x10  OHP yellow ball seated 2x10  UBE L3 x3 mins each way  Rows & Lats 20lb 2x10  Chest press 5lb 2x10 Triceps ext 20lb 2x15 Shoulder ER Red 2x10 Shoulder IR yellow w2x10   08/09/22 UBE L3 x3 mins each way  Finger ladder x5  OHP yellow ball seated 2x10  Seated rows green 2x10 Seated ER/IR with band green 2x10  Horizontal abd red 2x10  Standing shoulder flexion 2# 2x10  Standing shoulder ext behind the back 2# 2x10 Standing IR up back 2# 2x10  Bicep curls 3# 2x10 PROM to L shoulder    08/07/22 UBE L1 x 3 min each Wall slides 2x10 flexion and abd Shoulder Ext red 2x15 Rows green 2x10 Shoulder ER yellow 2x10 Shoulder Flex 1lb WaTE 2x10 Shoulder abd limited ROM 2x10 PROM to L shoulder Vaso to L shoulder 10 mins, med pressure  07/31/22 Wall slides x10 flexion and abd Red TB rows and ext 2x10 UBE L2 x forwards/back 1# seated flexion 3x5 Supine shoulder flexion 2x10 Supine chest press 2# 2x10  PROM to L shoulder Vaso to L shoulder 10 mins, med pressure   07/25/22 SUPINE AAROM L shoulder all planes Seated red t band L UE rows Seated red t band L shoulder ext with elbow straight Seated isometric B shoulder ER Seated L bicpes curls 2# Seated red t band B scapular punches(band around trunk) Beach ball squeezes , 15 reps 5 sec holds UBE level 1,  2 min F, 2 min B  07/17/22- EVAL    PATIENT  EDUCATION: Education details: POC and HEP Person educated: Patient Education method: Explanation Education comprehension: verbalized understanding  HOME EXERCISE PROGRAM: Access Code: [email or web address] URL: [email or web address] Date: 07/17/2022 Prepared by: Cassie Freer  Exercises - Supine Shoulder Flexion Extension AAROM with Dowel  - 1 x daily - 7 x weekly - 2 sets - 10 reps - Standing Shoulder Abduction AAROM with Dowel  - 1 x daily - 7 x weekly - 3 sets - 10 reps - Shoulder Flexion Wall Slide with Towel  - 1 x daily - 7 x weekly - 2 sets - 10 reps - Standing Shoulder Row with Anchored Resistance  - 1 x daily - 7 x weekly - 2 sets - 10 reps - Shoulder extension with resistance - Neutral  - 1 x daily - 7 x weekly - 2 sets - 10 reps  ASSESSMENT:  CLINICAL IMPRESSION: Pt has progressed increasing his L shoulder AROM but is still very weak especially into flexion and abduction. Was advised to keep coming to PT and schedule more visits to help with his strength. Focused on strengthening today as tolerated today.  Tried shoulder abduction with 1# seated, but unable to do. Also unable to do supine shoulder abduction without weight. Was able to get full PROM in all directions. Needs verbal and tactile cues throughout session for form and to slow down.   OBJECTIVE IMPAIRMENTS: decreased balance, decreased ROM, decreased strength, impaired UE functional use, and pain.   ACTIVITY LIMITATIONS: carrying, lifting, dressing, reach over head, and hygiene/grooming  REHAB POTENTIAL: Good  CLINICAL DECISION MAKING: Stable/uncomplicated  EVALUATION COMPLEXITY: Low  GOALS: Goals reviewed with patient? Yes  SHORT TERM GOALS: Target date: 08/28/22  Patient will be independent with initial HEP.  Goal status: Met 08/27/20  LONG TERM GOALS: Target date: 10/09/22  Patient will be independent with advanced/ongoing HEP to improve outcomes and carryover.  Goal status: INITIAL  2.  Patient will  report 75% improvement in L shoulder pain to improve QOL.  Baseline: 6/10 Goal status: Met 08/28/22  3.  Patient to improve L shoulder AROM to [facility] without pain provocation to allow for increased ease of ADLs.  Goal status: Progressing 09/04/22  4.  Patient will demonstrate improved functional UE strength as demonstrated by 5/5. Baseline: 2+, 3+ Goal status: INITIAL  5.  Patient will report 84 on FOTO (patient outcome measure)  to demonstrate improved functional ability.  Baseline: 62 Goal status: INITIAL  PLAN:  PT FREQUENCY: 2x/week  PT DURATION: 12 weeks  PLANNED INTERVENTIONS: Therapeutic exercises, Therapeutic activity, Neuromuscular re-education, Balance training, Gait training, Patient/Family education, Self Care, Joint mobilization, Electrical stimulation, Cryotherapy, Moist heat, Vasopneumatic device, Ultrasound, Ionotophoresis 4mg /ml Dexamethasone, and Manual therapy  PLAN FOR NEXT SESSION: AAROM, PROM, light strengthening for L shoulder as tolerated    Grayce Sessions, PTA 09/04/2022, 11:44 AM

## 2022-09-06 ENCOUNTER — Ambulatory Visit: Payer: Medicare Other

## 2022-09-06 DIAGNOSIS — M25512 Pain in left shoulder: Secondary | ICD-10-CM

## 2022-09-06 DIAGNOSIS — M25612 Stiffness of left shoulder, not elsewhere classified: Secondary | ICD-10-CM

## 2022-09-06 DIAGNOSIS — M6281 Muscle weakness (generalized): Secondary | ICD-10-CM | POA: Diagnosis not present

## 2022-09-06 DIAGNOSIS — R2689 Other abnormalities of gait and mobility: Secondary | ICD-10-CM | POA: Diagnosis not present

## 2022-09-11 ENCOUNTER — Encounter: Payer: Self-pay | Admitting: Physical Therapy

## 2022-09-11 ENCOUNTER — Ambulatory Visit: Payer: Medicare Other | Attending: Family Medicine | Admitting: Physical Therapy

## 2022-09-11 DIAGNOSIS — R2689 Other abnormalities of gait and mobility: Secondary | ICD-10-CM | POA: Diagnosis not present

## 2022-09-11 DIAGNOSIS — M25612 Stiffness of left shoulder, not elsewhere classified: Secondary | ICD-10-CM | POA: Insufficient documentation

## 2022-09-11 DIAGNOSIS — M25512 Pain in left shoulder: Secondary | ICD-10-CM | POA: Diagnosis not present

## 2022-09-11 DIAGNOSIS — M6281 Muscle weakness (generalized): Secondary | ICD-10-CM | POA: Diagnosis not present

## 2022-09-11 NOTE — Therapy (Signed)
OUTPATIENT PHYSICAL THERAPY SHOULDER TREATMENT   Patient Name: Harold Pittman MRN: [id number] DOB:12-21-1940, 82 y.o., male Today's Date: 09/11/2022  END OF SESSION:  PT End of Session - 09/11/22 1144     Visit Number 10    Date for PT Re-Evaluation 10/09/22    PT Start Time 1144    PT Stop Time 1230    PT Time Calculation (min) 46 min    Activity Tolerance Patient tolerated treatment well    Behavior During Therapy WFL for tasks assessed/performed               Past Medical History:  Diagnosis Date   Anemia    low iron   Anxiety state, unspecified    Blood in stool    Chronic airway obstruction, not elsewhere classified    Coronary artery disease    Coronary atherosclerosis of artery bypass graft    Dyspnea    ED (erectile dysfunction)    Esophageal reflux    Gout, unspecified    Osteoarthrosis, unspecified whether generalized or localized, unspecified site    Other and unspecified hyperlipidemia    Other specified disorder of rectum and anus    Personal history of tobacco use, presenting hazards to health    Sleep apnea    does not use cpap   Type II or unspecified type diabetes mellitus without mention of complication, not stated as uncontrolled    Unspecified essential hypertension    Past Surgical History:  Procedure Laterality Date   BIOPSY  09/26/2017   Procedure: BIOPSY;  Surgeon: Meryl Dare, MD;  Location: [facility] ENDOSCOPY;  Service: Endoscopy;;   BIOPSY  02/03/2018   Procedure: BIOPSY;  Surgeon: Lemar Lofty., MD;  Location: [facility] ENDOSCOPY;  Service: Gastroenterology;;   COLONOSCOPY WITH PROPOFOL N/A 09/26/2017   Procedure: COLONOSCOPY WITH PROPOFOL;  Surgeon: Meryl Dare, MD;  Location: [facility] ENDOSCOPY;  Service: Endoscopy;  Laterality: N/A;   CORONARY ARTERY BYPASS GRAFT  97   LIMA to LAD, sequential saphenous vein graft to the first and second diagonal, sequential saphenous vein graft to the intermediate OM and circumflex and SVG to RCA    ESOPHAGOGASTRODUODENOSCOPY (EGD) WITH PROPOFOL N/A 09/26/2017   Procedure: ESOPHAGOGASTRODUODENOSCOPY (EGD) WITH PROPOFOL;  Surgeon: Meryl Dare, MD;  Location: [facility] ENDOSCOPY;  Service: Endoscopy;  Laterality: N/A;   ESOPHAGOGASTRODUODENOSCOPY (EGD) WITH PROPOFOL N/A 02/03/2018   Procedure: ESOPHAGOGASTRODUODENOSCOPY (EGD);  Surgeon: Lemar Lofty., MD;  Location: [facility] ENDOSCOPY;  Service: Gastroenterology;  Laterality: N/A;   KNEE SURGERY     BILATERAL   POLYPECTOMY  09/26/2017   Procedure: POLYPECTOMY;  Surgeon: Meryl Dare, MD;  Location: [facility] ENDOSCOPY;  Service: Endoscopy;;   ROTATOR CUFF REPAIR     SUBMUCOSAL INJECTION  02/03/2018   Procedure: SUBMUCOSAL INJECTION;  Surgeon: Lemar Lofty., MD;  Location: [facility] ENDOSCOPY;  Service: Gastroenterology;;   Patient Active Problem List   Diagnosis Date Noted   Acute gouty arthritis 03/28/2022   Gout flare 01/28/2022   Peripheral neuropathy 01/15/2022   Ataxia 01/15/2022   Type 2 diabetes mellitus (A1c 6.0 on 05/03/2021) with steroid-induced hyperglycemia 05/16/2021   CKD (chronic kidney disease) stage 4, GFR 15-29 ml/min (HCC) 05/15/2021   Olecranon bursitis, right elbow 05/15/2021   Tremor 05/15/2021   Elbow pain, left 12/05/2020   Constipation 12/05/2020   Atherosclerosis of aorta (HCC) 12/05/2020   Chest pain, atypical 12/05/2020   CTS (carpal tunnel syndrome) 03/24/2020   Grief 03/24/2020   Cholelithiasis 09/16/2019  Nausea 09/02/2019   Weight loss 09/02/2019   Elevated troponin 01/12/2019   Hypertensive urgency 01/10/2019   Well adult exam 10/20/2018   Balanitis 10/20/2018   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/27/2017   Adenoma of stomach 12/25/2017   Abnormal findings on esophagogastroduodenoscopy (EGD) 12/25/2017   Sebaceous cyst 10/04/2017   Iron deficiency anemia    Benign neoplasm of sigmoid colon    Benign neoplasm of cecum    Benign neoplasm of transverse colon    Positive  occult stool blood test 09/24/2017   (HFpEF) heart failure with preserved ejection fraction (HCC) 09/24/2017   Hypokalemia 08/17/2015   Generalized weakness 07/22/2015   Gait disorder 03/18/2014   Low back pain 01/12/2014   History of colon polyps 12/17/2013   DM (diabetes mellitus), type 2 with peripheral vascular complications (HCC) 09/16/2013   Diarrhea 10/08/2012   Edema 10/08/2011   DOE (dyspnea on exertion) 06/29/2011   LATERAL EPICONDYLITIS, LEFT 02/20/2010   TOBACCO USE, QUIT 02/23/2009   Cough 02/16/2009   Hearing loss 10/15/2008   ERECTILE DYSFUNCTION 04/30/2008   Coronary artery disease involving native coronary artery of native heart without angina pectoris 04/30/2008   COPD (chronic obstructive pulmonary disease) (HCC) 05/21/2007   Anxiety state 05/20/2007   Insomnia 03/04/2007   Hyperlipidemia LDL goal <70 02/01/2007   Gout attack 02/01/2007   Essential hypertension 02/01/2007   GERD 02/01/2007   Osteoarthritis 02/01/2007    PCP: Macarthur Critchley Plotnikov  REFERRING PROVIDER: [name]  REFERRING DIAG: M25.512 (ICD-10-CM) - Acute pain of left shoulder S46.012A (ICD-10-CM) - Traumatic complete tear of left rotator cuff, initial encounter  THERAPY DIAG:  Muscle weakness (generalized)  Stiffness of left shoulder, not elsewhere classified  Acute pain of left shoulder  Other abnormalities of gait and mobility  Rationale for Evaluation and Treatment: Rehabilitation  ONSET DATE: 07/08/22  SUBJECTIVE:                                                                                                                                                                                      SUBJECTIVE STATEMENT: Good, today is my last day  Hand dominance: Right  PERTINENT HISTORY: Pt is an 82 y/o male c/o L shoulder pain ongoing since Easter Sunday, 3/31. He suffered a fall and then a couple days later noticed bruising and soreness in his L shoulder area. Pt locates pain to  all over the L shoulder joint, w/ bruising along the anterior forearm.   PAIN:  Are you having pain? Yes: NPRS scale: 0/10 Pain location: L shoulder Pain description: sore, sharp, ache Aggravating factors: moving it, reaching up and behind me   Relieving factors:  Tylenol  PRECAUTIONS: None  WEIGHT BEARING RESTRICTIONS: No  FALLS:  Has patient fallen in last 6 months? Yes. Number of falls "several falls but they didn't hurt me"   LIVING ENVIRONMENT: Lives with: lives alone Lives in: House/apartment Stairs: No Has following equipment at home: Quad cane small base  OCCUPATION: Retired  PLOF: Independent  PATIENT GOALS:get my arm back to normal, no pain  NEXT MD VISIT: 08/13/22  OBJECTIVE:   DIAGNOSTIC FINDINGS:  Diagnostic Limited MSK Ultrasound of: Left shoulder Biceps tendon appears to be intact and bicipital groove surrounded by hypoechoic fluid. Subscapularis tendon difficult to visualize may be complete rotator cuff tear with retraction. Supraspinatus tendon very difficult to visualize suspect full-thickness retracted rotator cuff tear. Infraspinatus tendon appears to be intact. Impression: Suspect full-thickness retracted rotator cuff tear supraspinatus tendon possibly subscapularis tendon.  FINDINGS: No gross fracture. No evidence for shoulder subluxation or dislocation. Degenerative changes are seen in the glenohumeral joint. There is some cortical irregularity along the inferior aspect of the glenoid, likely related to spurring although tiny fracture fragment or intra-articular loose body not excluded.   IMPRESSION: 1. Degenerative changes in the glenohumeral joint. 2. Cortical irregularity along the inferior aspect of the glenoid, likely related to spurring although tiny fracture fragment or intra-articular loose body not excluded.   PATIENT SURVEYS:  FOTO 62  COGNITION: Overall cognitive status: Within functional limits for tasks  assessed     SENSATION: WFL  POSTURE: Rounded shoulders, flexed trunk, increased thoracic kyphosis  UPPER EXTREMITY ROM:   Active ROM Right eval Left eval Left  09/04/22  Shoulder flexion WFL 70 w/pain 135  Shoulder extension     Shoulder abduction WFL 65 w/pain 120  Shoulder adduction     Shoulder internal rotation WFL Can get [facility] but some pain   Shoulder external rotation WFL Can get [facility] but some pain   Elbow flexion     Elbow extension     Wrist flexion     Wrist extension     Wrist ulnar deviation     Wrist radial deviation     Wrist pronation     Wrist supination     (Blank rows = not tested)  UPPER EXTREMITY MMT:  MMT Right eval Left eval Left  09/06/22  Shoulder flexion  2+ 2+  Shoulder extension     Shoulder abduction  2+ 2+  Shoulder adduction     Shoulder internal rotation  3+ 4-  Shoulder external rotation  3+ 4-  Middle trapezius     Lower trapezius     Elbow flexion  5   Elbow extension  5   Wrist flexion     Wrist extension     Wrist ulnar deviation     Wrist radial deviation     Wrist pronation     Wrist supination     Grip strength (lbs)     (Blank rows = not tested)  SHOULDER SPECIAL TESTS: Impingement tests: Hawkins/Kennedy impingement test: positive  and Painful arc test: positive  Rotator cuff assessment: Drop arm test: positive  and Empty can test: positive  Biceps assessment: Speed's test: positive   JOINT MOBILITY TESTING:  Muscle guarding past 90d of abduction, able to get near WNL with shoulder flexion  PALPATION:  Warm and TPP around shoulder joint and RC insertions, bruising in L arm and forearm   TODAY'S TREATMENT:  DATE:  09/11/22 UBE L3 x3 mins each way  Shoulder ext 10# 2x10 ER/IR red band 2x10  OHP yellow 2x10  Tricep Ext 20lb 2x10 Biceps Curls 10lb 2x10   09/06/22 NuStep L5  x71mins  Shoulder ext 5# x10, 10# x10 Bicep curls 15# 2x10 Tricep ext 20# 2x10  OHP yellow 2x10  ER/IR red band 2x10  Shoulder flexion 1# 2x10 PROM all directions  Supine chest press 2# 2x10 SA punches x10   09/04/22 NuStep L 5 x6 min UBE L 2 x 3 min Shoulder flex 1lb 2x10  LUE shoulder ER yellow 2x15 LUE IR yellow 2x12 Chest press 5lb 2x10 Shoulder Ext 5lb 2x10  08/30/22 PROM seated in all directions  Shoulder ER Red 2x10 Shoulder IR Red 2x10  NuStep L5 x59mins  Rows and Lats 25# 2x10  Chest press 5# x10, x8 Bicep curls 10# 2x10 Tricep ext 20# 2x10 Horizontal abd red 2x10 Shoulder flexion and abd raises 1# 2x5   08/28/22 AAROM flex, Ext, IR x10  OHP yellow ball seated 2x10  UBE L3 x3 mins each way  Rows & Lats 20lb 2x10  Chest press 5lb 2x10 Triceps ext 20lb 2x15 Shoulder ER Red 2x10 Shoulder IR yellow w2x10   08/09/22 UBE L3 x3 mins each way  Finger ladder x5  OHP yellow ball seated 2x10  Seated rows green 2x10 Seated ER/IR with band green 2x10  Horizontal abd red 2x10  Standing shoulder flexion 2# 2x10  Standing shoulder ext behind the back 2# 2x10 Standing IR up back 2# 2x10  Bicep curls 3# 2x10 PROM to L shoulder    08/07/22 UBE L1 x 3 min each Wall slides 2x10 flexion and abd Shoulder Ext red 2x15 Rows green 2x10 Shoulder ER yellow 2x10 Shoulder Flex 1lb WaTE 2x10 Shoulder abd limited ROM 2x10 PROM to L shoulder Vaso to L shoulder 10 mins, med pressure  07/31/22 Wall slides x10 flexion and abd Red TB rows and ext 2x10 UBE L2 x forwards/back 1# seated flexion 3x5 Supine shoulder flexion 2x10 Supine chest press 2# 2x10  PROM to L shoulder Vaso to L shoulder 10 mins, med pressure   07/25/22 SUPINE AAROM L shoulder all planes Seated red t band L UE rows Seated red t band L shoulder ext with elbow straight Seated isometric B shoulder ER Seated L bicpes curls 2# Seated red t band B scapular punches(band around trunk) Beach ball  squeezes , 15 reps 5 sec holds UBE level 1,  2 min F, 2 min B  07/17/22- EVAL    PATIENT EDUCATION: Education details: POC and HEP Person educated: Patient Education method: Explanation Education comprehension: verbalized understanding  HOME EXERCISE PROGRAM: Access Code: [email or web address] URL: [email or web address] Date: 07/17/2022 Prepared by: Cassie Freer  Exercises - Supine Shoulder Flexion Extension AAROM with Dowel  - 1 x daily - 7 x weekly - 2 sets - 10 reps - Standing Shoulder Abduction AAROM with Dowel  - 1 x daily - 7 x weekly - 3 sets - 10 reps - Shoulder Flexion Wall Slide with Towel  - 1 x daily - 7 x weekly - 2 sets - 10 reps - Standing Shoulder Row with Anchored Resistance  - 1 x daily - 7 x weekly - 2 sets - 10 reps - Shoulder extension with resistance - Neutral  - 1 x daily - 7 x weekly - 2 sets - 10 reps  ASSESSMENT:  CLINICAL IMPRESSION: Pt has progressed increasing his L shoulder AROM but is  still very weak especially into flexion and abduction. Focused on strengthening today as tolerated today. Pt reports being pleased with his current functional status and reports no functional limitations. Needs verbal and tactile cues throughout session for form and to slow down.   OBJECTIVE IMPAIRMENTS: decreased balance, decreased ROM, decreased strength, impaired UE functional use, and pain.   ACTIVITY LIMITATIONS: carrying, lifting, dressing, reach over head, and hygiene/grooming  REHAB POTENTIAL: Good  CLINICAL DECISION MAKING: Stable/uncomplicated  EVALUATION COMPLEXITY: Low  GOALS: Goals reviewed with patient? Yes  SHORT TERM GOALS: Target date: 08/28/22  Patient will be independent with initial HEP.  Goal status: Met 08/27/20  LONG TERM GOALS: Target date: 10/09/22  Patient will be independent with advanced/ongoing HEP to improve outcomes and carryover.  Goal status: MET  2.  Patient will report 75% improvement in L shoulder pain to improve QOL.   Baseline: 6/10 Goal status: Met 08/28/22  3.  Patient to improve L shoulder AROM to [facility] without pain provocation to allow for increased ease of ADLs.  Goal status: Progressing 09/04/22  4.  Patient will demonstrate improved functional UE strength as demonstrated by 5/5. Baseline: 2+, 3+ Goal status: NOT MET  5.  Patient will report 33 on FOTO (patient outcome measure)  to demonstrate improved functional ability.  Baseline: 62 Goal status: progressed 66 09/11/22  PLAN:  PT FREQUENCY: 2x/week  PT DURATION: 12 weeks  PLANNED INTERVENTIONS: Therapeutic exercises, Therapeutic activity, Neuromuscular re-education, Balance training, Gait training, Patient/Family education, Self Care, Joint mobilization, Electrical stimulation, Cryotherapy, Moist heat, Vasopneumatic device, Ultrasound, Ionotophoresis 4mg /ml Dexamethasone, and Manual therapy  PHYSICAL THERAPY DISCHARGE SUMMARY  Visits from Start of Care: 10    Patient agrees to discharge. Patient goals were partially met. Patient is being discharged due to being pleased with the current functional level.   PLAN FOR NEXT SESSION: D/C PT  Cassie Freer, DPT Grayce Sessions, PTA 09/11/2022, 11:45 AM

## 2022-09-26 ENCOUNTER — Other Ambulatory Visit: Payer: Self-pay | Admitting: Internal Medicine

## 2022-10-01 ENCOUNTER — Ambulatory Visit (INDEPENDENT_AMBULATORY_CARE_PROVIDER_SITE_OTHER): Payer: Medicare Other | Admitting: Internal Medicine

## 2022-10-01 ENCOUNTER — Encounter: Payer: Self-pay | Admitting: Internal Medicine

## 2022-10-01 VITALS — BP 120/68 | HR 65 | Temp 98.6°F | Ht 72.0 in | Wt 164.0 lb

## 2022-10-01 DIAGNOSIS — M1009 Idiopathic gout, multiple sites: Secondary | ICD-10-CM | POA: Diagnosis not present

## 2022-10-01 DIAGNOSIS — N184 Chronic kidney disease, stage 4 (severe): Secondary | ICD-10-CM

## 2022-10-01 DIAGNOSIS — I2510 Atherosclerotic heart disease of native coronary artery without angina pectoris: Secondary | ICD-10-CM | POA: Diagnosis not present

## 2022-10-01 DIAGNOSIS — Z634 Disappearance and death of family member: Secondary | ICD-10-CM

## 2022-10-01 DIAGNOSIS — E1151 Type 2 diabetes mellitus with diabetic peripheral angiopathy without gangrene: Secondary | ICD-10-CM | POA: Diagnosis not present

## 2022-10-01 DIAGNOSIS — R270 Ataxia, unspecified: Secondary | ICD-10-CM

## 2022-10-01 DIAGNOSIS — F4321 Adjustment disorder with depressed mood: Secondary | ICD-10-CM

## 2022-10-01 DIAGNOSIS — I5032 Chronic diastolic (congestive) heart failure: Secondary | ICD-10-CM

## 2022-10-01 DIAGNOSIS — E1159 Type 2 diabetes mellitus with other circulatory complications: Secondary | ICD-10-CM

## 2022-10-01 MED ORDER — AMLODIPINE BESYLATE 5 MG PO TABS: 5.0000 mg | ORAL_TABLET | Freq: Every day | ORAL | 3 refills | Status: DC

## 2022-10-01 MED ORDER — ONETOUCH VERIO FLEX SYSTEM W/DEVICE KIT
PACK | 0 refills | Status: AC
Start: 2022-10-01 — End: ?

## 2022-10-01 MED ORDER — ONETOUCH ULTRASOFT LANCETS MISC
12 refills | Status: AC
Start: 2022-10-01 — End: ?

## 2022-10-01 MED ORDER — GLUCOSE BLOOD VI STRP
ORAL_STRIP | 12 refills | Status: AC
Start: 2022-10-01 — End: ?

## 2022-10-01 NOTE — Patient Instructions (Signed)
Hold Pravastatin x 2 weeks 

## 2022-10-01 NOTE — Assessment & Plan Note (Signed)
Cont on Colchicine, Medrol pack prn ?

## 2022-10-01 NOTE — Assessment & Plan Note (Signed)
C/o cold and numb hands - long time; PVD and neuropathy 

## 2022-10-01 NOTE — Progress Notes (Signed)
Subjective:  Patient ID: Harold Pittman, male    DOB: [birth year]  Age: 82 y.o. MRN: [id number]  CC: Referral (Pt referral needed)   HPI AVETT REINECK presents for CHF, DM, CRF, ataxia, anemia Dtr died in 08/19/2022- brain aneurism Son died in 2020/10/18 - CVA  Outpatient Medications Prior to Visit  Medication Sig Dispense Refill   allopurinol (ZYLOPRIM) 100 MG tablet Take 0.5 tablets (50 mg total) by mouth daily. 90 tablet 1   amLODipine (NORVASC) 10 MG tablet Take 0.5 tablets (5 mg total) by mouth daily. 90 tablet 1   aspirin 81 MG EC tablet Take 1 tablet (81 mg total) by mouth daily. 100 tablet 3   B Complex-Folic Acid (B COMPLEX PLUS) TABS Take 1 tablet by mouth daily. 100 tablet 3   Blood Glucose Monitoring Suppl (ONETOUCH VERIO FLEX SYSTEM) w/Device KIT USE TO MONITOR BLOOD SUGAR 1 kit 0   carvedilol (COREG) 25 MG tablet Take 1 tablet (25 mg total) by mouth 2 (two) times daily with a meal. 180 tablet 3   Cholecalciferol (VITAMIN D3) 50 MCG (2000 UT) capsule Take 1 capsule (2,000 Units total) by mouth daily. 100 capsule 3   colchicine 0.6 MG tablet Take 1 tablet (0.6 mg total) by mouth 2 (two) times daily. 180 tablet 3   famotidine (PEPCID) 40 MG tablet TAKE 1 TABLET BY MOUTH DAILY 90 tablet 3   ferrous sulfate 325 (65 FE) MG tablet Take 325 mg by mouth at bedtime.     omeprazole (PRILOSEC) 40 MG capsule Take 1 capsule (40 mg total) by mouth daily. 90 capsule 3   pravastatin (PRAVACHOL) 80 MG tablet Take 1 tablet (80 mg total) by mouth at bedtime. 90 tablet 3   repaglinide (PRANDIN) 1 MG tablet TAKE 1TABLET BY MOUTH BEFORE OR WITH DINNER 90 tablet 3   torsemide (DEMADEX) 100 MG tablet TAKE 1 TABLET BY MOUTH DAILY 90 tablet 1   cyclobenzaprine (FLEXERIL) 10 MG tablet Take 0.5 tablets (5 mg total) by mouth 2 (two) times daily as needed for muscle spasms. 10 tablet 0   glucose blood (ONETOUCH VERIO) test strip CHECK BLOOD SUGAR 1 TO 2  TIMES DAILY AS DIRECTED 200 strip 3    HYDROcodone-acetaminophen (NORCO) 5-325 MG tablet Take 0.5-1 tablets by mouth every 6 (six) hours as needed for severe pain. 5 tablet 0   Lancets (ONETOUCH DELICA PLUS LANCET30G) MISC USE AS DIRECTED 200 each 3   Prednisol Ace-Moxiflox-Bromfen 1-0.5-0.075 % SUSP Place 1 drop into the right eye 4 (four) times daily.     predniSONE (DELTASONE) 10 MG tablet 3 tabs by mouth per day for 3 days,2tabs per day for 3 days,1tab per day for 3 days 18 tablet 0   insulin detemir (LEVEMIR) 100 UNIT/ML injection Inject 0.1 mLs (10 Units total) into the skin daily for 7 days. 0.7 mL 0   No facility-administered medications prior to visit.    ROS: Review of Systems  Constitutional:  Positive for fatigue. Negative for appetite change and unexpected weight change.  HENT:  Negative for congestion, nosebleeds, sneezing, sore throat and trouble swallowing.   Eyes:  Negative for itching and visual disturbance.  Respiratory:  Negative for cough.   Cardiovascular:  Negative for chest pain, palpitations and leg swelling.  Gastrointestinal:  Negative for abdominal distention, blood in stool, diarrhea and nausea.  Genitourinary:  Negative for frequency and hematuria.  Musculoskeletal:  Positive for arthralgias and gait problem. Negative for back pain, joint  swelling and neck pain.  Skin:  Negative for rash.  Neurological:  Positive for weakness. Negative for dizziness, tremors and speech difficulty.  Psychiatric/Behavioral:  Negative for agitation, dysphoric mood, sleep disturbance and suicidal ideas. The patient is not nervous/anxious.     Objective:  Ht 6' (1.829 m)   BMI 22.24 kg/m   BP Readings from Last 3 Encounters:  08/01/22 120/68  07/16/22 118/64  07/16/22 118/64    Wt Readings from Last 3 Encounters:  08/01/22 164 lb (74.4 kg)  07/16/22 165 lb (74.8 kg)  07/16/22 165 lb (74.8 kg)    Physical Exam Constitutional:      General: He is not in acute distress.    Appearance: Normal appearance. He  is well-developed.     Comments: NAD  Eyes:     Conjunctiva/sclera: Conjunctivae normal.     Pupils: Pupils are equal, round, and reactive to light.  Neck:     Thyroid: No thyromegaly.     Vascular: No JVD.  Cardiovascular:     Rate and Rhythm: Normal rate and regular rhythm.     Heart sounds: Normal heart sounds. No murmur heard.    No friction rub. No gallop.  Pulmonary:     Effort: Pulmonary effort is normal. No respiratory distress.     Breath sounds: Normal breath sounds. No wheezing or rales.  Chest:     Chest wall: No tenderness.  Abdominal:     General: Bowel sounds are normal. There is no distension.     Palpations: Abdomen is soft. There is no mass.     Tenderness: There is no abdominal tenderness. There is no guarding or rebound.  Musculoskeletal:        General: Tenderness present. Normal range of motion.     Cervical back: Normal range of motion.     Right lower leg: No edema.     Left lower leg: No edema.  Lymphadenopathy:     Cervical: No cervical adenopathy.  Skin:    General: Skin is warm and dry.     Findings: No rash.  Neurological:     Mental Status: He is alert and oriented to person, place, and time. Mental status is at baseline.     Cranial Nerves: No cranial nerve deficit.     Motor: No abnormal muscle tone.     Coordination: Coordination normal.     Gait: Gait normal.     Deep Tendon Reflexes: Reflexes are normal and symmetric.  Psychiatric:        Behavior: Behavior normal.        Thought Content: Thought content normal.        Judgment: Judgment normal.   Using a cane No edema In a w/c Ataxic   Lab Results  Component Value Date   WBC 6.1 05/02/2022   HGB 11.5 (L) 05/02/2022   HCT 33.5 (L) 05/02/2022   PLT 198.0 05/02/2022   GLUCOSE 66 (L) 08/01/2022   CHOL 120 07/21/2019   TRIG 68.0 07/21/2019   HDL 31.70 (L) 07/21/2019   LDLCALC 75 07/21/2019   ALT 20 08/01/2022   AST 26 08/01/2022   NA 148 (H) 08/01/2022   K 4.0 08/01/2022    CL 108 08/01/2022   CREATININE 2.05 (H) 08/01/2022   BUN 32 (H) 08/01/2022   CO2 31 08/01/2022   TSH 0.70 08/01/2022   PSA 8.92 (H) 04/22/2019   INR 1.35 07/23/2015   HGBA1C 5.5 08/01/2022   MICROALBUR 38.7 (H) 11/24/2013  VAS [place] LOWER EXTREMITY VENOUS (DVT) (ONLY [facility] & [facility])  Result Date: 01/26/2022  Lower Venous DVT Study Patient Name:  JERREN FLINCHBAUGH  Date of Exam:   01/26/2022 Medical Rec #: [id number]        Accession #:    [id number] Date of Birth: Apr 19, 1940         Patient Gender: M Patient Age:   12 years Exam Location:  [facility] Procedure:      VAS [place] LOWER EXTREMITY VENOUS (DVT) Referring Phys: COOPER ROBBINS --------------------------------------------------------------------------------  Indications: Edema.  Comparison Study: No prior study Performing Technologist: Gertie Fey MHA, RDMS, RVT, RDCS  Examination Guidelines: A complete evaluation includes B-mode imaging, spectral Doppler, color Doppler, and power Doppler as needed of all accessible portions of each vessel. Bilateral testing is considered an integral part of a complete examination. Limited examinations for reoccurring indications may be performed as noted. The reflux portion of the exam is performed with the patient in reverse Trendelenburg.  +---------+---------------+---------+-----------+----------+--------------+ RIGHT    CompressibilityPhasicitySpontaneityPropertiesThrombus Aging +---------+---------------+---------+-----------+----------+--------------+ CFV      Full           Yes      Yes                                 +---------+---------------+---------+-----------+----------+--------------+ SFJ      Full                                                        +---------+---------------+---------+-----------+----------+--------------+ FV Prox  Full                                                         +---------+---------------+---------+-----------+----------+--------------+ FV Mid   Full                                                        +---------+---------------+---------+-----------+----------+--------------+ FV DistalFull                                                        +---------+---------------+---------+-----------+----------+--------------+ PFV      Full                                                        +---------+---------------+---------+-----------+----------+--------------+ POP      Full           Yes      Yes                                 +---------+---------------+---------+-----------+----------+--------------+ PTV  Full                                                        +---------+---------------+---------+-----------+----------+--------------+ PERO     Full                                                        +---------+---------------+---------+-----------+----------+--------------+   +---------+---------------+---------+-----------+----------+--------------+ LEFT     CompressibilityPhasicitySpontaneityPropertiesThrombus Aging +---------+---------------+---------+-----------+----------+--------------+ CFV      Full           Yes      Yes                                 +---------+---------------+---------+-----------+----------+--------------+ SFJ      Full                                                        +---------+---------------+---------+-----------+----------+--------------+ FV Prox  Full                                                        +---------+---------------+---------+-----------+----------+--------------+ FV Mid   Full                                                        +---------+---------------+---------+-----------+----------+--------------+ FV DistalFull                                                         +---------+---------------+---------+-----------+----------+--------------+ PFV      Full                                                        +---------+---------------+---------+-----------+----------+--------------+ POP      Full           Yes      Yes                                 +---------+---------------+---------+-----------+----------+--------------+ PTV      Full                                                        +---------+---------------+---------+-----------+----------+--------------+  PERO     Full                                                        +---------+---------------+---------+-----------+----------+--------------+     Summary: RIGHT: - There is no evidence of deep vein thrombosis in the lower extremity.  - No cystic structure found in the popliteal fossa.  LEFT: - There is no evidence of deep vein thrombosis in the lower extremity.  - No cystic structure found in the popliteal fossa.  *See table(s) above for measurements and observations. Electronically signed by Servando Snare MD on 01/26/2022 at 5:28:02 PM.    Final    CT Chest Wo Contrast  Result Date: 01/26/2022 CLINICAL DATA:  82 year old male with history of chest pain radiating into the upper back. EXAM: CT CHEST WITHOUT CONTRAST TECHNIQUE: Multidetector CT imaging of the chest was performed following the standard protocol without IV contrast. RADIATION DOSE REDUCTION: This exam was performed according to the departmental dose-optimization program which includes automated exposure control, adjustment of the mA and/or kV according to patient size and/or use of iterative reconstruction technique. COMPARISON:  No priors. FINDINGS: Cardiovascular: Heart size is normal. There is no significant pericardial fluid, thickening or pericardial calcification. There is aortic atherosclerosis, as well as atherosclerosis of the great vessels of the mediastinum and the coronary arteries, including calcified  atherosclerotic plaque in the left main, left anterior descending, left circumflex and right coronary arteries. Status post median sternotomy for CABG including LIMA to the LAD. Mediastinum/Nodes: No pathologically enlarged mediastinal or hilar lymph nodes. Please note that accurate exclusion of hilar adenopathy is limited on noncontrast CT scans. Esophagus is unremarkable in appearance. No axillary lymphadenopathy. Lungs/Pleura: No acute consolidative airspace disease. No pleural effusions. Cluster of micro nodularity in the basal aspect of the right lower lobe, strongly favored to be benign areas of mucoid impaction within terminal bronchioles. No other larger more suspicious appearing pulmonary nodules or masses are noted. Diffuse bronchial wall thickening with mild centrilobular and paraseptal emphysema. Upper Abdomen: Aortic atherosclerosis. Calcified gallstones lying dependently in the gallbladder. Multiple low-attenuation lesions in the visualized liver, incompletely characterized on today's noncontrast CT examination, but statistically likely to represent cysts (no imaging follow-up is recommended). Incompletely imaged lesion in the upper pole of the right kidney which is complex in appearance with some faint internal calcifications. Additional 1.6 cm high attenuation lesion in the upper pole of the right kidney also noted. 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 Musculoskeletal: Median sternotomy wires. In the soft tissues of the upper anterior left chest wall adjacent to the first costochondral junction and inferior to the medial left clavicle (axial image 23 of series 3 and coronal image 59 of series 5) there is a partially calcified high density lesion measuring 3.1 x 2.4 x 1.9 cm. There are no aggressive appearing lytic or blastic lesions noted in the visualized portions of the skeleton.  IMPRESSION: 1. No acute findings in the thorax to account for the patient's symptoms. 2. Unusual partially calcified soft tissue lesion located between the undersurface of the medial left clavicle in the adjacent left first costochondral junction. This is of uncertain etiology and significance, but is favored to represent a benign area of heterotopic ossification. Given  the patient's advanced age, repeat noncontrast chest CT is suggested in 3-6 months to ensure the stability of this finding. 3. Aortic atherosclerosis, in addition to left main and three-vessel coronary artery disease. Status post median sternotomy for CABG including LIMA to the LAD. 4. Diffuse bronchial wall thickening with mild centrilobular and paraseptal emphysema; imaging findings suggestive of underlying COPD. 5. Cholelithiasis. 6. Additional incidental findings, as above. Aortic Atherosclerosis (ICD10-I70.0) and Emphysema (ICD10-J43.9). Electronically Signed   By: Trudie Reed M.D.   On: 01/26/2022 09:30   DG Chest 2 View  Result Date: 01/25/2022 CLINICAL DATA:  Chest pain EXAM: CHEST - 2 VIEW COMPARISON:  05/15/2021 FINDINGS: Stable cardiomediastinal contours status post sternotomy and CABG. No focal airspace consolidation, pleural effusion, or pneumothorax. IMPRESSION: No active cardiopulmonary disease. Electronically Signed   By: Duanne Guess D.O.   On: 01/25/2022 16:04    Assessment & Plan:   Problem List Items Addressed This Visit     Gout attack - Primary    Cont on Colchicine, Medrol pack prn      Coronary artery disease involving native coronary artery of native heart without angina pectoris    Cont w/ASA, Pravachol, Coreg  No angina      DM (diabetes mellitus), type 2 with peripheral vascular complications (HCC)    C/o cold and numb hands - long time; PVD and neuropathy      (HFpEF) heart failure with preserved ejection fraction (HCC)    Cont on Coreg, Hydralazine, Lasix, Irbesartan  F/u w/Dr Excell Seltzer       CKD (chronic kidney disease) stage 4, GFR 15-29 ml/min (HCC)    Hydrate well Monitor GFR      Ataxia    Pt had PT at home         No orders of the defined types were placed in this encounter.     Follow-up: No follow-ups on file.  Sonda Primes, MD

## 2022-10-01 NOTE — Assessment & Plan Note (Signed)
Cont on Coreg, Hydralazine, Lasix, Irbesartan  F/u w/Dr Excell Seltzer

## 2022-10-01 NOTE — Assessment & Plan Note (Signed)
Hydrate well ?Monitor GFR ?

## 2022-10-01 NOTE — Assessment & Plan Note (Signed)
Cont w/ASA, Pravachol, Coreg  No angina 

## 2022-10-01 NOTE — Assessment & Plan Note (Signed)
Dtr died in May 2024 - brain aneurism Son died in 04-16-20- CVA Discussed

## 2022-10-01 NOTE — Assessment & Plan Note (Signed)
Check A1c Cont on Prandin 

## 2022-10-01 NOTE — Assessment & Plan Note (Addendum)
Pt had PT at home  Weak legs - hold Pravastatin x 2 weeks

## 2022-10-15 ENCOUNTER — Telehealth: Payer: Self-pay | Admitting: Internal Medicine

## 2022-10-15 NOTE — Telephone Encounter (Signed)
Patient's son called to check on the status of the patient's referral for physical therapy. He was concerned since it was put in last month. Best callback for Harold Pittman is [phone].

## 2022-10-16 ENCOUNTER — Telehealth: Payer: Self-pay | Admitting: Internal Medicine

## 2022-10-16 NOTE — Telephone Encounter (Signed)
Pt son called and would like a referral sent back to [facility] at [facility] Please advise.  [address] [place], [place] [address] [phone]

## 2022-10-18 ENCOUNTER — Ambulatory Visit: Payer: Medicare Other | Attending: Internal Medicine | Admitting: Physical Therapy

## 2022-10-18 ENCOUNTER — Encounter: Payer: Self-pay | Admitting: Physical Therapy

## 2022-10-18 DIAGNOSIS — R262 Difficulty in walking, not elsewhere classified: Secondary | ICD-10-CM | POA: Insufficient documentation

## 2022-10-18 DIAGNOSIS — M6281 Muscle weakness (generalized): Secondary | ICD-10-CM | POA: Insufficient documentation

## 2022-10-18 DIAGNOSIS — M25512 Pain in left shoulder: Secondary | ICD-10-CM | POA: Insufficient documentation

## 2022-10-18 DIAGNOSIS — R296 Repeated falls: Secondary | ICD-10-CM | POA: Insufficient documentation

## 2022-10-18 DIAGNOSIS — R270 Ataxia, unspecified: Secondary | ICD-10-CM | POA: Insufficient documentation

## 2022-10-18 DIAGNOSIS — R2689 Other abnormalities of gait and mobility: Secondary | ICD-10-CM | POA: Diagnosis not present

## 2022-10-18 DIAGNOSIS — M25612 Stiffness of left shoulder, not elsewhere classified: Secondary | ICD-10-CM | POA: Diagnosis not present

## 2022-10-18 NOTE — Therapy (Signed)
OUTPATIENT PHYSICAL THERAPY LOWER EXTREMITY EVALUATION   Patient Name: Harold Pittman MRN: [id number] DOB:1940/09/27, 82 y.o., male Today's Date: 10/18/2022  END OF SESSION:  PT End of Session - 10/18/22 1013     Visit Number 1    Date for PT Re-Evaluation 01/18/23    Authorization Type [employer] [place]    PT Start Time 1010    PT Stop Time 1100    PT Time Calculation (min) 50 min    Activity Tolerance Patient tolerated treatment well    Behavior During Therapy WFL for tasks assessed/performed             Past Medical History:  Diagnosis Date   Anemia    low iron   Anxiety state, unspecified    Blood in stool    Chronic airway obstruction, not elsewhere classified    Coronary artery disease    Coronary atherosclerosis of artery bypass graft    Dyspnea    ED (erectile dysfunction)    Esophageal reflux    Gout, unspecified    Osteoarthrosis, unspecified whether generalized or localized, unspecified site    Other and unspecified hyperlipidemia    Other specified disorder of rectum and anus    Personal history of tobacco use, presenting hazards to health    Sleep apnea    does not use cpap   Type II or unspecified type diabetes mellitus without mention of complication, not stated as uncontrolled    Unspecified essential hypertension    Past Surgical History:  Procedure Laterality Date   BIOPSY  09/26/2017   Procedure: BIOPSY;  Surgeon: Meryl Dare, MD;  Location: [facility] ENDOSCOPY;  Service: Endoscopy;;   BIOPSY  02/03/2018   Procedure: BIOPSY;  Surgeon: Lemar Lofty., MD;  Location: [facility] ENDOSCOPY;  Service: Gastroenterology;;   COLONOSCOPY WITH PROPOFOL N/A 09/26/2017   Procedure: COLONOSCOPY WITH PROPOFOL;  Surgeon: Meryl Dare, MD;  Location: [facility] ENDOSCOPY;  Service: Endoscopy;  Laterality: N/A;   CORONARY ARTERY BYPASS GRAFT  97   LIMA to LAD, sequential saphenous vein graft to the first and second diagonal, sequential saphenous vein graft to the  intermediate OM and circumflex and SVG to RCA   ESOPHAGOGASTRODUODENOSCOPY (EGD) WITH PROPOFOL N/A 09/26/2017   Procedure: ESOPHAGOGASTRODUODENOSCOPY (EGD) WITH PROPOFOL;  Surgeon: Meryl Dare, MD;  Location: [facility] ENDOSCOPY;  Service: Endoscopy;  Laterality: N/A;   ESOPHAGOGASTRODUODENOSCOPY (EGD) WITH PROPOFOL N/A 02/03/2018   Procedure: ESOPHAGOGASTRODUODENOSCOPY (EGD);  Surgeon: Lemar Lofty., MD;  Location: [facility] ENDOSCOPY;  Service: Gastroenterology;  Laterality: N/A;   KNEE SURGERY     BILATERAL   POLYPECTOMY  09/26/2017   Procedure: POLYPECTOMY;  Surgeon: Meryl Dare, MD;  Location: [facility] ENDOSCOPY;  Service: Endoscopy;;   ROTATOR CUFF REPAIR     SUBMUCOSAL INJECTION  02/03/2018   Procedure: SUBMUCOSAL INJECTION;  Surgeon: Lemar Lofty., MD;  Location: [facility] ENDOSCOPY;  Service: Gastroenterology;;   Patient Active Problem List   Diagnosis Date Noted   Acute gouty arthritis 03/28/2022   Gout flare 01/28/2022   Peripheral neuropathy 01/15/2022   Ataxia 01/15/2022   Type 2 diabetes mellitus (A1c 6.0 on 05/03/2021) with steroid-induced hyperglycemia 05/16/2021   CKD (chronic kidney disease) stage 4, GFR 15-29 ml/min (HCC) 05/15/2021   Olecranon bursitis, right elbow 05/15/2021   Tremor 05/15/2021   Elbow pain, left 12/05/2020   Constipation 12/05/2020   Atherosclerosis of aorta (HCC) 12/05/2020   Chest pain, atypical 12/05/2020   CTS (carpal tunnel syndrome) 03/24/2020   Grief at  loss of child 03/24/2020   Cholelithiasis 09/16/2019   Nausea 09/02/2019   Weight loss 09/02/2019   Elevated troponin 01/12/2019   Hypertensive urgency 01/10/2019   Well adult exam 10/20/2018   Balanitis 10/20/2018   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/27/2017   Adenoma of stomach 12/25/2017   Abnormal findings on esophagogastroduodenoscopy (EGD) 12/25/2017   Sebaceous cyst 10/04/2017   Iron deficiency anemia    Benign neoplasm of sigmoid colon    Benign neoplasm of  cecum    Benign neoplasm of transverse colon    Positive occult stool blood test 09/24/2017   (HFpEF) heart failure with preserved ejection fraction (HCC) 09/24/2017   Hypokalemia 08/17/2015   Generalized weakness 07/22/2015   Gait disorder 03/18/2014   Low back pain 01/12/2014   History of colon polyps 12/17/2013   DM (diabetes mellitus), type 2 with peripheral vascular complications (HCC) 09/16/2013   Diarrhea 10/08/2012   Edema 10/08/2011   DOE (dyspnea on exertion) 06/29/2011   LATERAL EPICONDYLITIS, LEFT 02/20/2010   TOBACCO USE, QUIT 02/23/2009   Cough 02/16/2009   Hearing loss 10/15/2008   ERECTILE DYSFUNCTION 04/30/2008   Coronary artery disease involving native coronary artery of native heart without angina pectoris 04/30/2008   COPD (chronic obstructive pulmonary disease) (HCC) 05/21/2007   Anxiety state 05/20/2007   Insomnia 03/04/2007   Hyperlipidemia LDL goal <70 02/01/2007   Gout attack 02/01/2007   Essential hypertension 02/01/2007   GERD 02/01/2007   Osteoarthritis 02/01/2007    PCP: Plotnikov, MD  REFERRING PROVIDER: Plotnikov, MD  REFERRING DIAG: Ataxic gait  THERAPY DIAG:  Muscle weakness (generalized)  Stiffness of left shoulder, not elsewhere classified  Acute pain of left shoulder  Difficulty in walking, not elsewhere classified  Repeated falls  Rationale for Evaluation and Treatment: Rehabilitation  ONSET DATE: 10/15/22  SUBJECTIVE:   SUBJECTIVE STATEMENT: Patient reports two falls in the past 6 months one shoulder injury that he was seen here for , he reports some continued pain and difficulty reaching behind to get wallet.  He reports that he has been having more difficulty walking, reports that over the past two months he has had to use an electric cart to shop  PERTINENT HISTORY: See above PAIN:  Are you having pain? Yes: NPRS scale: 0/10 Pain location: left shoulder upper arm Pain description: sharp Aggravating factors: reaching  back to get wallet 6/10 Relieving factors: rest not moving no pain  PRECAUTIONS: Fall  RED FLAGS: None   WEIGHT BEARING RESTRICTIONS: No  FALLS:  Has patient fallen in last 6 months? Yes. Number of falls 2  LIVING ENVIRONMENT: Lives with: lives alone Lives in: House/apartment Stairs: No Has following equipment at home: Single point cane, Environmental consultant - 4 wheeled, Marine scientist  OCCUPATION: retired Psychologist, occupational  PLOF: Independent  PATIENT GOALS: walk better, have better balance, reach wallet  NEXT MD VISIT: none  OBJECTIVE:   DIAGNOSTIC FINDINGS: none  COGNITION: Overall cognitive status: Within functional limits for tasks assessed     SENSATION: WFL  POSTURE: rounded shoulders and forward head  PALPATION: Non tender  LOWER EXTREMITY ROM: WFL's except ankle DF was unable to get to neutral and had very poor control of lateral motions  UPPER EXTREMITY ROM:  left shoulder flexion 140, abduction 130, ER 60, IR 20 degrees with IR painful  SHOULDER MMT:  flexion/abduction 3/5, ER 3+/5, IR 3+/5 pain   LOWER EXTREMITY MMT:  MMT Right eval Left eval  Hip flexion 4- 4-  Hip extension  4- 4-  Hip abduction 3+ 3+  Hip adduction    Hip internal rotation    Hip external rotation    Knee flexion 4- 4-  Knee extension 4- 4-  Ankle dorsiflexion 1 1  Ankle plantarflexion 2 2  Ankle inversion 3+ 3+  Ankle eversion 3+ 3+   (Blank rows = not tested) FUNCTIONAL TESTS:  5 times sit to stand: 50 seconds had to use hands after multiple attmepts Timed up and go (TUG): 32 seconds with holding wall Berg Balance Scale: 27/56  GAIT: Distance walked: 100 feet Assistive device utilized: Single point cane Level of assistance: SBA Comments: slow, tends to use walls and furniture   TODAY'S TREATMENT:                                                                                                                              DATE:     PATIENT EDUCATION:  Education details:  POC/HEP Person educated: Patient Education method: Programmer, multimedia, Facilities manager, Verbal cues, and Handouts Education comprehension: verbalized understanding  HOME EXERCISE PROGRAM: Access Code: [email or web address] URL: [email or web address] Date: 10/18/2022 Prepared by: Stacie Glaze  Exercises - Standing Hip Abduction with Counter Support  - 1 x daily - 7 x weekly - 2 sets - 10 reps - 3 hold - Standing March with Counter Support  - 1 x daily - 7 x weekly - 2 sets - 10 reps - 3 hold - Heel Raises with Counter Support  - 1 x daily - 7 x weekly - 2 sets - 10 reps - 3 hold  ASSESSMENT:  CLINICAL IMPRESSION: Patient is a 82 y.o. male who was seen today for physical therapy evaluation and treatment for poor gait, repeated falls and the left shoulder stiffness and weakness.  He has had 2 falls recently, reports that over the past month or two he has had to use an electric cart for shopping, legs are very weak, has very poor balance, has to use hands to get up from sitting and tends to use walls for balance   OBJECTIVE IMPAIRMENTS: Abnormal gait, cardiopulmonary status limiting activity, decreased activity tolerance, decreased balance, decreased endurance, decreased mobility, difficulty walking, decreased ROM, decreased strength, increased muscle spasms, impaired flexibility, and pain.   REHAB POTENTIAL: Good  CLINICAL DECISION MAKING: Stable/uncomplicated  EVALUATION COMPLEXITY: Low   GOALS: Goals reviewed with patient? Yes  SHORT TERM GOALS: Target date: 11/01/22 Independent with initial HEP Goal status: INITIAL  LONG TERM GOALS: Target date: 01/18/23  Independent with advanced HEP Goal status: INITIAL  2.  Increase Berg balance test score to 40/56 Goal status: INITIAL  3.  Decrease TUG time to 19 seconds Goal status: INITIAL  4.  Increase left shoulder IR to 60 degrees Goal status: INITIAL  5.  Increase LE strength to 4/5 Goal status: INITIAL  PLAN:  PT  FREQUENCY: 1-2x/week  PT DURATION: 12 weeks  PLANNED INTERVENTIONS: Therapeutic exercises, Therapeutic activity, Neuromuscular re-education, Balance training, Gait  training, Patient/Family education, Self Care, Joint mobilization, Stair training, and Manual therapy  PLAN FOR NEXT SESSION: start balance and LE strength, conitnue to look at and try to help left shoulder function   Esra Frankowski W, PT 10/18/2022, 10:13 AM

## 2022-10-22 ENCOUNTER — Other Ambulatory Visit: Payer: Self-pay | Admitting: Internal Medicine

## 2022-10-23 ENCOUNTER — Encounter: Payer: Self-pay | Admitting: Physical Therapy

## 2022-10-23 ENCOUNTER — Ambulatory Visit: Payer: Medicare Other | Admitting: Physical Therapy

## 2022-10-23 DIAGNOSIS — R2689 Other abnormalities of gait and mobility: Secondary | ICD-10-CM | POA: Diagnosis not present

## 2022-10-23 DIAGNOSIS — R262 Difficulty in walking, not elsewhere classified: Secondary | ICD-10-CM | POA: Diagnosis not present

## 2022-10-23 DIAGNOSIS — M6281 Muscle weakness (generalized): Secondary | ICD-10-CM

## 2022-10-23 DIAGNOSIS — M25512 Pain in left shoulder: Secondary | ICD-10-CM | POA: Diagnosis not present

## 2022-10-23 DIAGNOSIS — R270 Ataxia, unspecified: Secondary | ICD-10-CM | POA: Diagnosis not present

## 2022-10-23 DIAGNOSIS — R296 Repeated falls: Secondary | ICD-10-CM | POA: Diagnosis not present

## 2022-10-23 DIAGNOSIS — M25612 Stiffness of left shoulder, not elsewhere classified: Secondary | ICD-10-CM

## 2022-10-23 NOTE — Therapy (Signed)
OUTPATIENT PHYSICAL THERAPY LOWER EXTREMITY TREATMENT   Patient Name: Harold Pittman MRN: [id number] DOB:1940-09-14, 82 y.o., male Today's Date: 10/23/2022  END OF SESSION:  PT End of Session - 10/23/22 0931     Visit Number 2    Date for PT Re-Evaluation 01/18/23    PT Start Time 0930    PT Stop Time 1015    PT Time Calculation (min) 45 min    Activity Tolerance Patient tolerated treatment well    Behavior During Therapy WFL for tasks assessed/performed             Past Medical History:  Diagnosis Date   Anemia    low iron   Anxiety state, unspecified    Blood in stool    Chronic airway obstruction, not elsewhere classified    Coronary artery disease    Coronary atherosclerosis of artery bypass graft    Dyspnea    ED (erectile dysfunction)    Esophageal reflux    Gout, unspecified    Osteoarthrosis, unspecified whether generalized or localized, unspecified site    Other and unspecified hyperlipidemia    Other specified disorder of rectum and anus    Personal history of tobacco use, presenting hazards to health    Sleep apnea    does not use cpap   Type II or unspecified type diabetes mellitus without mention of complication, not stated as uncontrolled    Unspecified essential hypertension    Past Surgical History:  Procedure Laterality Date   BIOPSY  09/26/2017   Procedure: BIOPSY;  Surgeon: Meryl Dare, MD;  Location: [facility] ENDOSCOPY;  Service: Endoscopy;;   BIOPSY  02/03/2018   Procedure: BIOPSY;  Surgeon: Lemar Lofty., MD;  Location: [facility] ENDOSCOPY;  Service: Gastroenterology;;   COLONOSCOPY WITH PROPOFOL N/A 09/26/2017   Procedure: COLONOSCOPY WITH PROPOFOL;  Surgeon: Meryl Dare, MD;  Location: [facility] ENDOSCOPY;  Service: Endoscopy;  Laterality: N/A;   CORONARY ARTERY BYPASS GRAFT  97   LIMA to LAD, sequential saphenous vein graft to the first and second diagonal, sequential saphenous vein graft to the intermediate OM and circumflex and SVG to  RCA   ESOPHAGOGASTRODUODENOSCOPY (EGD) WITH PROPOFOL N/A 09/26/2017   Procedure: ESOPHAGOGASTRODUODENOSCOPY (EGD) WITH PROPOFOL;  Surgeon: Meryl Dare, MD;  Location: [facility] ENDOSCOPY;  Service: Endoscopy;  Laterality: N/A;   ESOPHAGOGASTRODUODENOSCOPY (EGD) WITH PROPOFOL N/A 02/03/2018   Procedure: ESOPHAGOGASTRODUODENOSCOPY (EGD);  Surgeon: Lemar Lofty., MD;  Location: [facility] ENDOSCOPY;  Service: Gastroenterology;  Laterality: N/A;   KNEE SURGERY     BILATERAL   POLYPECTOMY  09/26/2017   Procedure: POLYPECTOMY;  Surgeon: Meryl Dare, MD;  Location: [facility] ENDOSCOPY;  Service: Endoscopy;;   ROTATOR CUFF REPAIR     SUBMUCOSAL INJECTION  02/03/2018   Procedure: SUBMUCOSAL INJECTION;  Surgeon: Lemar Lofty., MD;  Location: [facility] ENDOSCOPY;  Service: Gastroenterology;;   Patient Active Problem List   Diagnosis Date Noted   Acute gouty arthritis 03/28/2022   Gout flare 01/28/2022   Peripheral neuropathy 01/15/2022   Ataxia 01/15/2022   Type 2 diabetes mellitus (A1c 6.0 on 05/03/2021) with steroid-induced hyperglycemia 05/16/2021   CKD (chronic kidney disease) stage 4, GFR 15-29 ml/min (HCC) 05/15/2021   Olecranon bursitis, right elbow 05/15/2021   Tremor 05/15/2021   Elbow pain, left 12/05/2020   Constipation 12/05/2020   Atherosclerosis of aorta (HCC) 12/05/2020   Chest pain, atypical 12/05/2020   CTS (carpal tunnel syndrome) 03/24/2020   Grief at loss of child 03/24/2020   Cholelithiasis  09/16/2019   Nausea 09/02/2019   Weight loss 09/02/2019   Elevated troponin 01/12/2019   Hypertensive urgency 01/10/2019   Well adult exam 10/20/2018   Balanitis 10/20/2018   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/27/2017   Adenoma of stomach 12/25/2017   Abnormal findings on esophagogastroduodenoscopy (EGD) 12/25/2017   Sebaceous cyst 10/04/2017   Iron deficiency anemia    Benign neoplasm of sigmoid colon    Benign neoplasm of cecum    Benign neoplasm of transverse  colon    Positive occult stool blood test 09/24/2017   (HFpEF) heart failure with preserved ejection fraction (HCC) 09/24/2017   Hypokalemia 08/17/2015   Generalized weakness 07/22/2015   Gait disorder 03/18/2014   Low back pain 01/12/2014   History of colon polyps 12/17/2013   DM (diabetes mellitus), type 2 with peripheral vascular complications (HCC) 09/16/2013   Diarrhea 10/08/2012   Edema 10/08/2011   DOE (dyspnea on exertion) 06/29/2011   LATERAL EPICONDYLITIS, LEFT 02/20/2010   TOBACCO USE, QUIT 02/23/2009   Cough 02/16/2009   Hearing loss 10/15/2008   ERECTILE DYSFUNCTION 04/30/2008   Coronary artery disease involving native coronary artery of native heart without angina pectoris 04/30/2008   COPD (chronic obstructive pulmonary disease) (HCC) 05/21/2007   Anxiety state 05/20/2007   Insomnia 03/04/2007   Hyperlipidemia LDL goal <70 02/01/2007   Gout attack 02/01/2007   Essential hypertension 02/01/2007   GERD 02/01/2007   Osteoarthritis 02/01/2007    PCP: Plotnikov, MD  REFERRING PROVIDER: Plotnikov, MD  REFERRING DIAG: Ataxic gait  THERAPY DIAG:  Muscle weakness (generalized)  Stiffness of left shoulder, not elsewhere classified  Acute pain of left shoulder  Difficulty in walking, not elsewhere classified  Repeated falls  Rationale for Evaluation and Treatment: Rehabilitation  ONSET DATE: 10/15/22  SUBJECTIVE:   SUBJECTIVE STATEMENT: Doing ok  PERTINENT HISTORY: See above PAIN:  Are you having pain? Yes: NPRS scale: 0/10 Pain location: left shoulder upper arm Pain description: sharp Aggravating factors: reaching back to get wallet 6/10 Relieving factors: rest not moving no pain  PRECAUTIONS: Fall  RED FLAGS: None   WEIGHT BEARING RESTRICTIONS: No  FALLS:  Has patient fallen in last 6 months? Yes. Number of falls 2  LIVING ENVIRONMENT: Lives with: lives alone Lives in: House/apartment Stairs: No Has following equipment at home: Single  point cane, Environmental consultant - 4 wheeled, Marine scientist  OCCUPATION: retired Psychologist, occupational  PLOF: Independent  PATIENT GOALS: walk better, have better balance, reach wallet  NEXT MD VISIT: none  OBJECTIVE:   DIAGNOSTIC FINDINGS: none  COGNITION: Overall cognitive status: Within functional limits for tasks assessed     SENSATION: WFL  POSTURE: rounded shoulders and forward head  PALPATION: Non tender  LOWER EXTREMITY ROM: WFL's except ankle DF was unable to get to neutral and had very poor control of lateral motions  UPPER EXTREMITY ROM:  left shoulder flexion 140, abduction 130, ER 60, IR 20 degrees with IR painful  SHOULDER MMT:  flexion/abduction 3/5, ER 3+/5, IR 3+/5 pain   LOWER EXTREMITY MMT:  MMT Right eval Left eval  Hip flexion 4- 4-  Hip extension 4- 4-  Hip abduction 3+ 3+  Hip adduction    Hip internal rotation    Hip external rotation    Knee flexion 4- 4-  Knee extension 4- 4-  Ankle dorsiflexion 1 1  Ankle plantarflexion 2 2  Ankle inversion 3+ 3+  Ankle eversion 3+ 3+   (Blank rows = not tested) FUNCTIONAL TESTS:  5 times sit to stand: 50 seconds had to use hands after multiple attmepts Timed up and go (TUG): 32 seconds with holding wall Berg Balance Scale: 27/56  GAIT: Distance walked: 100 feet Assistive device utilized: Single point cane Level of assistance: SBA Comments: slow, tends to use walls and furniture   TODAY'S TREATMENT:                                                                                                                              DATE:   10/23/22 NuStep L 5 x 6 min HS curls 25lb 2x10  Leg ext 10lb 2x10 S2S elevated surface with UE use 2x8 weak Supine   Supine bridges x10  LE on [place] bridges, oblq, K2C  LUE PROM  AAROM 3lb WaTE shoulder flex x10   LUE flex x5, 1lb x5  LUE WE IR 1lb x5 each Stading shoulder abd  PATIENT EDUCATION:  Education details: POC/HEP Person educated: Patient Education method:  Programmer, multimedia, Facilities manager, Verbal cues, and Handouts Education comprehension: verbalized understanding  HOME EXERCISE PROGRAM: Access Code: [email or web address] URL: [email or web address] Date: 10/18/2022 Prepared by: Stacie Glaze  Exercises - Standing Hip Abduction with Counter Support  - 1 x daily - 7 x weekly - 2 sets - 10 reps - 3 hold - Standing March with Counter Support  - 1 x daily - 7 x weekly - 2 sets - 10 reps - 3 hold - Heel Raises with Counter Support  - 1 x daily - 7 x weekly - 2 sets - 10 reps - 3 hold  ASSESSMENT:  CLINICAL IMPRESSION: Patient is a 82 y.o. male who was seen today for physical therapy  treatment for poor gait, repeated falls and the left shoulder stiffness and weakness. Session consisted of LE and Ue strengthening. Pt is very weak int he posterior chain with sit to stands. Cues not to allow LE to push against mat. No issue with curls and extentions. Good L shoulder PROM. L shoulder is weak and limited actively with all interventions.  OBJECTIVE IMPAIRMENTS: Abnormal gait, cardiopulmonary status limiting activity, decreased activity tolerance, decreased balance, decreased endurance, decreased mobility, difficulty walking, decreased ROM, decreased strength, increased muscle spasms, impaired flexibility, and pain.   REHAB POTENTIAL: Good  CLINICAL DECISION MAKING: Stable/uncomplicated  EVALUATION COMPLEXITY: Low   GOALS: Goals reviewed with patient? Yes  SHORT TERM GOALS: Target date: 11/01/22 Independent with initial HEP Goal status: INITIAL  LONG TERM GOALS: Target date: 01/18/23  Independent with advanced HEP Goal status: INITIAL  2.  Increase Berg balance test score to 40/56 Goal status: INITIAL  3.  Decrease TUG time to 19 seconds Goal status: INITIAL  4.  Increase left shoulder IR to 60 degrees Goal status: INITIAL  5.  Increase LE strength to 4/5 Goal status: INITIAL  PLAN:  PT FREQUENCY: 1-2x/week  PT DURATION: 12  weeks  PLANNED INTERVENTIONS: Therapeutic exercises, Therapeutic activity, Neuromuscular re-education, Balance training, Gait training, Patient/Family education, Self  Care, Joint mobilization, Stair training, and Manual therapy  PLAN FOR NEXT SESSION: start balance and LE strength, conitnue to look at and try to help left shoulder function   Grayce Sessions, PTA 10/23/2022, 9:32 AM

## 2022-10-29 ENCOUNTER — Other Ambulatory Visit: Payer: Self-pay | Admitting: *Deleted

## 2022-10-29 MED ORDER — PRAVASTATIN SODIUM 80 MG PO TABS: 80.0000 mg | ORAL_TABLET | Freq: Every day | ORAL | 3 refills | Status: DC

## 2022-10-30 ENCOUNTER — Encounter: Payer: Self-pay | Admitting: Physical Therapy

## 2022-10-30 ENCOUNTER — Ambulatory Visit: Payer: Medicare Other | Admitting: Physical Therapy

## 2022-10-30 DIAGNOSIS — M6281 Muscle weakness (generalized): Secondary | ICD-10-CM

## 2022-10-30 DIAGNOSIS — M25612 Stiffness of left shoulder, not elsewhere classified: Secondary | ICD-10-CM

## 2022-10-30 DIAGNOSIS — R270 Ataxia, unspecified: Secondary | ICD-10-CM | POA: Diagnosis not present

## 2022-10-30 DIAGNOSIS — R262 Difficulty in walking, not elsewhere classified: Secondary | ICD-10-CM | POA: Diagnosis not present

## 2022-10-30 DIAGNOSIS — R296 Repeated falls: Secondary | ICD-10-CM

## 2022-10-30 DIAGNOSIS — M25512 Pain in left shoulder: Secondary | ICD-10-CM

## 2022-10-30 DIAGNOSIS — R2689 Other abnormalities of gait and mobility: Secondary | ICD-10-CM | POA: Diagnosis not present

## 2022-10-30 NOTE — Therapy (Signed)
OUTPATIENT PHYSICAL THERAPY LOWER EXTREMITY TREATMENT   Patient Name: Harold Pittman MRN: [id number] DOB:12-12-1940, 82 y.o., male Today's Date: 10/30/2022  END OF SESSION:  PT End of Session - 10/30/22 1144     Visit Number 3    Date for PT Re-Evaluation 01/18/23    PT Start Time 1145    PT Stop Time 1230    PT Time Calculation (min) 45 min    Activity Tolerance Patient tolerated treatment well    Behavior During Therapy WFL for tasks assessed/performed             Past Medical History:  Diagnosis Date   Anemia    low iron   Anxiety state, unspecified    Blood in stool    Chronic airway obstruction, not elsewhere classified    Coronary artery disease    Coronary atherosclerosis of artery bypass graft    Dyspnea    ED (erectile dysfunction)    Esophageal reflux    Gout, unspecified    Osteoarthrosis, unspecified whether generalized or localized, unspecified site    Other and unspecified hyperlipidemia    Other specified disorder of rectum and anus    Personal history of tobacco use, presenting hazards to health    Sleep apnea    does not use cpap   Type II or unspecified type diabetes mellitus without mention of complication, not stated as uncontrolled    Unspecified essential hypertension    Past Surgical History:  Procedure Laterality Date   BIOPSY  09/26/2017   Procedure: BIOPSY;  Surgeon: Meryl Dare, MD;  Location: [facility] ENDOSCOPY;  Service: Endoscopy;;   BIOPSY  02/03/2018   Procedure: BIOPSY;  Surgeon: Lemar Lofty., MD;  Location: [facility] ENDOSCOPY;  Service: Gastroenterology;;   COLONOSCOPY WITH PROPOFOL N/A 09/26/2017   Procedure: COLONOSCOPY WITH PROPOFOL;  Surgeon: Meryl Dare, MD;  Location: [facility] ENDOSCOPY;  Service: Endoscopy;  Laterality: N/A;   CORONARY ARTERY BYPASS GRAFT  97   LIMA to LAD, sequential saphenous vein graft to the first and second diagonal, sequential saphenous vein graft to the intermediate OM and circumflex and SVG to  RCA   ESOPHAGOGASTRODUODENOSCOPY (EGD) WITH PROPOFOL N/A 09/26/2017   Procedure: ESOPHAGOGASTRODUODENOSCOPY (EGD) WITH PROPOFOL;  Surgeon: Meryl Dare, MD;  Location: [facility] ENDOSCOPY;  Service: Endoscopy;  Laterality: N/A;   ESOPHAGOGASTRODUODENOSCOPY (EGD) WITH PROPOFOL N/A 02/03/2018   Procedure: ESOPHAGOGASTRODUODENOSCOPY (EGD);  Surgeon: Lemar Lofty., MD;  Location: [facility] ENDOSCOPY;  Service: Gastroenterology;  Laterality: N/A;   KNEE SURGERY     BILATERAL   POLYPECTOMY  09/26/2017   Procedure: POLYPECTOMY;  Surgeon: Meryl Dare, MD;  Location: [facility] ENDOSCOPY;  Service: Endoscopy;;   ROTATOR CUFF REPAIR     SUBMUCOSAL INJECTION  02/03/2018   Procedure: SUBMUCOSAL INJECTION;  Surgeon: Lemar Lofty., MD;  Location: [facility] ENDOSCOPY;  Service: Gastroenterology;;   Patient Active Problem List   Diagnosis Date Noted   Acute gouty arthritis 03/28/2022   Gout flare 01/28/2022   Peripheral neuropathy 01/15/2022   Ataxia 01/15/2022   Type 2 diabetes mellitus (A1c 6.0 on 05/03/2021) with steroid-induced hyperglycemia 05/16/2021   CKD (chronic kidney disease) stage 4, GFR 15-29 ml/min (HCC) 05/15/2021   Olecranon bursitis, right elbow 05/15/2021   Tremor 05/15/2021   Elbow pain, left 12/05/2020   Constipation 12/05/2020   Atherosclerosis of aorta (HCC) 12/05/2020   Chest pain, atypical 12/05/2020   CTS (carpal tunnel syndrome) 03/24/2020   Grief at loss of child 03/24/2020   Cholelithiasis  09/16/2019   Nausea 09/02/2019   Weight loss 09/02/2019   Elevated troponin 01/12/2019   Hypertensive urgency 01/10/2019   Well adult exam 10/20/2018   Balanitis 10/20/2018   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/27/2017   Adenoma of stomach 12/25/2017   Abnormal findings on esophagogastroduodenoscopy (EGD) 12/25/2017   Sebaceous cyst 10/04/2017   Iron deficiency anemia    Benign neoplasm of sigmoid colon    Benign neoplasm of cecum    Benign neoplasm of transverse  colon    Positive occult stool blood test 09/24/2017   (HFpEF) heart failure with preserved ejection fraction (HCC) 09/24/2017   Hypokalemia 08/17/2015   Generalized weakness 07/22/2015   Gait disorder 03/18/2014   Low back pain 01/12/2014   History of colon polyps 12/17/2013   DM (diabetes mellitus), type 2 with peripheral vascular complications (HCC) 09/16/2013   Diarrhea 10/08/2012   Edema 10/08/2011   DOE (dyspnea on exertion) 06/29/2011   LATERAL EPICONDYLITIS, LEFT 02/20/2010   TOBACCO USE, QUIT 02/23/2009   Cough 02/16/2009   Hearing loss 10/15/2008   ERECTILE DYSFUNCTION 04/30/2008   Coronary artery disease involving native coronary artery of native heart without angina pectoris 04/30/2008   COPD (chronic obstructive pulmonary disease) (HCC) 05/21/2007   Anxiety state 05/20/2007   Insomnia 03/04/2007   Hyperlipidemia LDL goal <70 02/01/2007   Gout attack 02/01/2007   Essential hypertension 02/01/2007   GERD 02/01/2007   Osteoarthritis 02/01/2007    PCP: Plotnikov, MD  REFERRING PROVIDER: Plotnikov, MD  REFERRING DIAG: Ataxic gait  THERAPY DIAG:  Muscle weakness (generalized)  Stiffness of left shoulder, not elsewhere classified  Acute pain of left shoulder  Difficulty in walking, not elsewhere classified  Repeated falls  Rationale for Evaluation and Treatment: Rehabilitation  ONSET DATE: 10/15/22  SUBJECTIVE:   SUBJECTIVE STATEMENT: "I feel good"  PERTINENT HISTORY: See above PAIN:  Are you having pain? Yes: NPRS scale: 0/10 Pain location: left shoulder upper arm Pain description: sharp Aggravating factors: reaching back to get wallet 6/10 Relieving factors: rest not moving no pain  PRECAUTIONS: Fall  RED FLAGS: None   WEIGHT BEARING RESTRICTIONS: No  FALLS:  Has patient fallen in last 6 months? Yes. Number of falls 2  LIVING ENVIRONMENT: Lives with: lives alone Lives in: House/apartment Stairs: No Has following equipment at home:  Single point cane, Environmental consultant - 4 wheeled, Marine scientist  OCCUPATION: retired Psychologist, occupational  PLOF: Independent  PATIENT GOALS: walk better, have better balance, reach wallet  NEXT MD VISIT: none  OBJECTIVE:   DIAGNOSTIC FINDINGS: none  COGNITION: Overall cognitive status: Within functional limits for tasks assessed     SENSATION: WFL  POSTURE: rounded shoulders and forward head  PALPATION: Non tender  LOWER EXTREMITY ROM: WFL's except ankle DF was unable to get to neutral and had very poor control of lateral motions  UPPER EXTREMITY ROM:  left shoulder flexion 140, abduction 130, ER 60, IR 20 degrees with IR painful  SHOULDER MMT:  flexion/abduction 3/5, ER 3+/5, IR 3+/5 pain   LOWER EXTREMITY MMT:  MMT Right eval Left eval  Hip flexion 4- 4-  Hip extension 4- 4-  Hip abduction 3+ 3+  Hip adduction    Hip internal rotation    Hip external rotation    Knee flexion 4- 4-  Knee extension 4- 4-  Ankle dorsiflexion 1 1  Ankle plantarflexion 2 2  Ankle inversion 3+ 3+  Ankle eversion 3+ 3+   (Blank rows = not tested) FUNCTIONAL TESTS:  5 times sit to stand: 50 seconds had to use hands after multiple attmepts Timed up and go (TUG): 32 seconds with holding wall Berg Balance Scale: 27/56  GAIT: Distance walked: 100 feet Assistive device utilized: Single point cane Level of assistance: SBA Comments: slow, tends to use walls and furniture   TODAY'S TREATMENT:                                                                                                                              DATE:   10/30/22 Bike L 3 x 6 min Leg press 20lb 2x12 Standing shoulder Ext 5b 2x10  S2S elevated surface  with focus on anterior weight shifts, not allowing LE to push against table. X10 x5  HS curls 35lb 2x12 Leg Ext 10lb 2x12 Alt 4 in box taps  Attempted sanding march with 2lb cuff and SPC multiple LOB when lifting LLE   10/23/22 NuStep L 5 x 6 min HS curls 25lb 2x10  Leg ext  10lb 2x10 S2S elevated surface with UE use 2x8 weak Supine   Supine bridges x10  LE on Pball bridges, oblq, K2C  LUE PROM  AAROM 3lb WaTE shoulder flex x10   LUE flex x5, 1lb x5  LUE WE IR 1lb x5 each Stading shoulder abd  PATIENT EDUCATION:  Education details: POC/HEP Person educated: Patient Education method: Programmer, multimedia, Facilities manager, Verbal cues, and Handouts Education comprehension: verbalized understanding  HOME EXERCISE PROGRAM: Access Code: [email or web address] URL: [email or web address] Date: 10/18/2022 Prepared by: Stacie Glaze  Exercises - Standing Hip Abduction with Counter Support  - 1 x daily - 7 x weekly - 2 sets - 10 reps - 3 hold - Standing March with Counter Support  - 1 x daily - 7 x weekly - 2 sets - 10 reps - 3 hold - Heel Raises with Counter Support  - 1 x daily - 7 x weekly - 2 sets - 10 reps - 3 hold  ASSESSMENT:  CLINICAL IMPRESSION: Patient is a 82 y.o. male who was seen today for physical therapy  treatment for poor gait, repeated falls and the left shoulder stiffness and weakness. Session consisted of Le strength and balance. Pt is very weak int he posterior chain with sit to stands, time spent on safe tranfers of sit to stands. Cues not to allow LE to push against mat. No issue with curls and extentions. Pt had difficult time with alt box taps and standing marches. LOB x 1 with at box taps needing mod assist to correct.   OBJECTIVE IMPAIRMENTS: Abnormal gait, cardiopulmonary status limiting activity, decreased activity tolerance, decreased balance, decreased endurance, decreased mobility, difficulty walking, decreased ROM, decreased strength, increased muscle spasms, impaired flexibility, and pain.   REHAB POTENTIAL: Good  CLINICAL DECISION MAKING: Stable/uncomplicated  EVALUATION COMPLEXITY: Low   GOALS: Goals reviewed with patient? Yes  SHORT TERM GOALS: Target date: 11/01/22 Independent with initial HEP Goal status: INITIAL  LONG  TERM GOALS: Target date:  01/18/23  Independent with advanced HEP Goal status: INITIAL  2.  Increase Berg balance test score to 40/56 Goal status: INITIAL  3.  Decrease TUG time to 19 seconds Goal status: INITIAL  4.  Increase left shoulder IR to 60 degrees Goal status: INITIAL  5.  Increase LE strength to 4/5 Goal status: INITIAL  PLAN:  PT FREQUENCY: 1-2x/week  PT DURATION: 12 weeks  PLANNED INTERVENTIONS: Therapeutic exercises, Therapeutic activity, Neuromuscular re-education, Balance training, Gait training, Patient/Family education, Self Care, Joint mobilization, Stair training, and Manual therapy  PLAN FOR NEXT SESSION: start balance and LE strength, conitnue to look at and try to help left shoulder function   Grayce Sessions, PTA 10/30/2022, 11:48 AM

## 2022-11-01 ENCOUNTER — Encounter: Payer: Self-pay | Admitting: Physical Therapy

## 2022-11-01 ENCOUNTER — Ambulatory Visit: Payer: Medicare Other | Admitting: Physical Therapy

## 2022-11-01 DIAGNOSIS — R2689 Other abnormalities of gait and mobility: Secondary | ICD-10-CM | POA: Diagnosis not present

## 2022-11-01 DIAGNOSIS — M6281 Muscle weakness (generalized): Secondary | ICD-10-CM | POA: Diagnosis not present

## 2022-11-01 DIAGNOSIS — M25512 Pain in left shoulder: Secondary | ICD-10-CM | POA: Diagnosis not present

## 2022-11-01 DIAGNOSIS — R262 Difficulty in walking, not elsewhere classified: Secondary | ICD-10-CM

## 2022-11-01 DIAGNOSIS — M25612 Stiffness of left shoulder, not elsewhere classified: Secondary | ICD-10-CM

## 2022-11-01 DIAGNOSIS — R296 Repeated falls: Secondary | ICD-10-CM | POA: Diagnosis not present

## 2022-11-01 DIAGNOSIS — R270 Ataxia, unspecified: Secondary | ICD-10-CM | POA: Diagnosis not present

## 2022-11-01 NOTE — Therapy (Signed)
OUTPATIENT PHYSICAL THERAPY LOWER EXTREMITY TREATMENT   Patient Name: Harold Pittman MRN: [id number] DOB:1941/03/16, 82 y.o., male Today's Date: 11/01/2022  END OF SESSION:  PT End of Session - 11/01/22 1145     Visit Number 4    Date for PT Re-Evaluation 01/18/23    PT Start Time 1145    PT Stop Time 1230    PT Time Calculation (min) 45 min    Activity Tolerance Patient tolerated treatment well    Behavior During Therapy WFL for tasks assessed/performed             Past Medical History:  Diagnosis Date   Anemia    low iron   Anxiety state, unspecified    Blood in stool    Chronic airway obstruction, not elsewhere classified    Coronary artery disease    Coronary atherosclerosis of artery bypass graft    Dyspnea    ED (erectile dysfunction)    Esophageal reflux    Gout, unspecified    Osteoarthrosis, unspecified whether generalized or localized, unspecified site    Other and unspecified hyperlipidemia    Other specified disorder of rectum and anus    Personal history of tobacco use, presenting hazards to health    Sleep apnea    does not use cpap   Type II or unspecified type diabetes mellitus without mention of complication, not stated as uncontrolled    Unspecified essential hypertension    Past Surgical History:  Procedure Laterality Date   BIOPSY  09/26/2017   Procedure: BIOPSY;  Surgeon: Meryl Dare, MD;  Location: [facility] ENDOSCOPY;  Service: Endoscopy;;   BIOPSY  02/03/2018   Procedure: BIOPSY;  Surgeon: Lemar Lofty., MD;  Location: [facility] ENDOSCOPY;  Service: Gastroenterology;;   COLONOSCOPY WITH PROPOFOL N/A 09/26/2017   Procedure: COLONOSCOPY WITH PROPOFOL;  Surgeon: Meryl Dare, MD;  Location: [facility] ENDOSCOPY;  Service: Endoscopy;  Laterality: N/A;   CORONARY ARTERY BYPASS GRAFT  97   LIMA to LAD, sequential saphenous vein graft to the first and second diagonal, sequential saphenous vein graft to the intermediate OM and circumflex and SVG to  RCA   ESOPHAGOGASTRODUODENOSCOPY (EGD) WITH PROPOFOL N/A 09/26/2017   Procedure: ESOPHAGOGASTRODUODENOSCOPY (EGD) WITH PROPOFOL;  Surgeon: Meryl Dare, MD;  Location: [facility] ENDOSCOPY;  Service: Endoscopy;  Laterality: N/A;   ESOPHAGOGASTRODUODENOSCOPY (EGD) WITH PROPOFOL N/A 02/03/2018   Procedure: ESOPHAGOGASTRODUODENOSCOPY (EGD);  Surgeon: Lemar Lofty., MD;  Location: [facility] ENDOSCOPY;  Service: Gastroenterology;  Laterality: N/A;   KNEE SURGERY     BILATERAL   POLYPECTOMY  09/26/2017   Procedure: POLYPECTOMY;  Surgeon: Meryl Dare, MD;  Location: [facility] ENDOSCOPY;  Service: Endoscopy;;   ROTATOR CUFF REPAIR     SUBMUCOSAL INJECTION  02/03/2018   Procedure: SUBMUCOSAL INJECTION;  Surgeon: Lemar Lofty., MD;  Location: [facility] ENDOSCOPY;  Service: Gastroenterology;;   Patient Active Problem List   Diagnosis Date Noted   Acute gouty arthritis 03/28/2022   Gout flare 01/28/2022   Peripheral neuropathy 01/15/2022   Ataxia 01/15/2022   Type 2 diabetes mellitus (A1c 6.0 on 05/03/2021) with steroid-induced hyperglycemia 05/16/2021   CKD (chronic kidney disease) stage 4, GFR 15-29 ml/min (HCC) 05/15/2021   Olecranon bursitis, right elbow 05/15/2021   Tremor 05/15/2021   Elbow pain, left 12/05/2020   Constipation 12/05/2020   Atherosclerosis of aorta (HCC) 12/05/2020   Chest pain, atypical 12/05/2020   CTS (carpal tunnel syndrome) 03/24/2020   Grief at loss of child 03/24/2020   Cholelithiasis  09/16/2019   Nausea 09/02/2019   Weight loss 09/02/2019   Elevated troponin 01/12/2019   Hypertensive urgency 01/10/2019   Well adult exam 10/20/2018   Balanitis 10/20/2018   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/27/2017   Adenoma of stomach 12/25/2017   Abnormal findings on esophagogastroduodenoscopy (EGD) 12/25/2017   Sebaceous cyst 10/04/2017   Iron deficiency anemia    Benign neoplasm of sigmoid colon    Benign neoplasm of cecum    Benign neoplasm of transverse  colon    Positive occult stool blood test 09/24/2017   (HFpEF) heart failure with preserved ejection fraction (HCC) 09/24/2017   Hypokalemia 08/17/2015   Generalized weakness 07/22/2015   Gait disorder 03/18/2014   Low back pain 01/12/2014   History of colon polyps 12/17/2013   DM (diabetes mellitus), type 2 with peripheral vascular complications (HCC) 09/16/2013   Diarrhea 10/08/2012   Edema 10/08/2011   DOE (dyspnea on exertion) 06/29/2011   LATERAL EPICONDYLITIS, LEFT 02/20/2010   TOBACCO USE, QUIT 02/23/2009   Cough 02/16/2009   Hearing loss 10/15/2008   ERECTILE DYSFUNCTION 04/30/2008   Coronary artery disease involving native coronary artery of native heart without angina pectoris 04/30/2008   COPD (chronic obstructive pulmonary disease) (HCC) 05/21/2007   Anxiety state 05/20/2007   Insomnia 03/04/2007   Hyperlipidemia LDL goal <70 02/01/2007   Gout attack 02/01/2007   Essential hypertension 02/01/2007   GERD 02/01/2007   Osteoarthritis 02/01/2007    PCP: Plotnikov, MD  REFERRING PROVIDER: Plotnikov, MD  REFERRING DIAG: Ataxic gait  THERAPY DIAG:  Muscle weakness (generalized)  Stiffness of left shoulder, not elsewhere classified  Acute pain of left shoulder  Difficulty in walking, not elsewhere classified  Repeated falls  Rationale for Evaluation and Treatment: Rehabilitation  ONSET DATE: 10/15/22  SUBJECTIVE:   SUBJECTIVE STATEMENT: "I feel good"  PERTINENT HISTORY: See above PAIN:  Are you having pain? Yes: NPRS scale: 0/10 Pain location: left shoulder upper arm Pain description: sharp Aggravating factors: reaching back to get wallet 6/10 Relieving factors: rest not moving no pain  PRECAUTIONS: Fall  RED FLAGS: None   WEIGHT BEARING RESTRICTIONS: No  FALLS:  Has patient fallen in last 6 months? Yes. Number of falls 2  LIVING ENVIRONMENT: Lives with: lives alone Lives in: House/apartment Stairs: No Has following equipment at home:  Single point cane, Environmental consultant - 4 wheeled, Marine scientist  OCCUPATION: retired Psychologist, occupational  PLOF: Independent  PATIENT GOALS: walk better, have better balance, reach wallet  NEXT MD VISIT: none  OBJECTIVE:   DIAGNOSTIC FINDINGS: none  COGNITION: Overall cognitive status: Within functional limits for tasks assessed     SENSATION: WFL  POSTURE: rounded shoulders and forward head  PALPATION: Non tender  LOWER EXTREMITY ROM: WFL's except ankle DF was unable to get to neutral and had very poor control of lateral motions  UPPER EXTREMITY ROM:  left shoulder flexion 140, abduction 130, ER 60, IR 20 degrees with IR painful  SHOULDER MMT:  flexion/abduction 3/5, ER 3+/5, IR 3+/5 pain   LOWER EXTREMITY MMT:  MMT Right eval Left eval  Hip flexion 4- 4-  Hip extension 4- 4-  Hip abduction 3+ 3+  Hip adduction    Hip internal rotation    Hip external rotation    Knee flexion 4- 4-  Knee extension 4- 4-  Ankle dorsiflexion 1 1  Ankle plantarflexion 2 2  Ankle inversion 3+ 3+  Ankle eversion 3+ 3+   (Blank rows = not tested) FUNCTIONAL TESTS:  5 times sit to stand: 50 seconds had to use hands after multiple attmepts Timed up and go (TUG): 32 seconds with holding wall Berg Balance Scale: 27/56  GAIT: Distance walked: 100 feet Assistive device utilized: Single point cane Level of assistance: SBA Comments: slow, tends to use walls and furniture   TODAY'S TREATMENT:                                                                                                                              DATE:   11/01/22 Bike L4 x6 min HS curls 35lb 2x12 Leg Ext 10lb 2x12 Side step in // bars Tried gait in // frequent LOB Leg press 20lb 2x10 Alt 2in box taps w/ SPC 4in step up HHA x 1    10/30/22 Bike L 3 x 6 min Leg press 20lb 2x12 Standing shoulder Ext 5b 2x10  S2S elevated surface  with focus on anterior weight shifts, not allowing LE to push against table. X10 x5  HS curls  35lb 2x12 Leg Ext 10lb 2x12 Alt 4 in box taps  Attempted sanding march with 2lb cuff and SPC multiple LOB when lifting LLE   10/23/22 NuStep L 5 x 6 min HS curls 25lb 2x10  Leg ext 10lb 2x10 S2S elevated surface with UE use 2x8 weak Supine   Supine bridges x10  LE on Pball bridges, oblq, K2C  LUE PROM  AAROM 3lb WaTE shoulder flex x10   LUE flex x5, 1lb x5  LUE WE IR 1lb x5 each Stading shoulder abd  PATIENT EDUCATION:  Education details: POC/HEP Person educated: Patient Education method: Programmer, multimedia, Facilities manager, Verbal cues, and Handouts Education comprehension: verbalized understanding  HOME EXERCISE PROGRAM: Access Code: [email or web address] URL: [email or web address] Date: 10/18/2022 Prepared by: Stacie Glaze  Exercises - Standing Hip Abduction with Counter Support  - 1 x daily - 7 x weekly - 2 sets - 10 reps - 3 hold - Standing March with Counter Support  - 1 x daily - 7 x weekly - 2 sets - 10 reps - 3 hold - Heel Raises with Counter Support  - 1 x daily - 7 x weekly - 2 sets - 10 reps - 3 hold  ASSESSMENT:  CLINICAL IMPRESSION: Patient is a 82 y.o. male who was seen today for physical therapy  treatment for poor gait, repeated falls and the left shoulder stiffness and weakness. Session consisted of LE strength and balance. No issues completing machine level interventions. Pt is very weak on leg press. Pt is very unstable with gait cues needed to pick up feet. UE uses needed when attempting gait in // bars. Pt had difficult time with alt box taps. Pt tends to have a difficult time lifting his LLE with activities and mobility.   OBJECTIVE IMPAIRMENTS: Abnormal gait, cardiopulmonary status limiting activity, decreased activity tolerance, decreased balance, decreased endurance, decreased mobility, difficulty walking, decreased ROM, decreased strength, increased muscle spasms, impaired flexibility, and pain.   REHAB  POTENTIAL: Good  CLINICAL DECISION MAKING:  Stable/uncomplicated  EVALUATION COMPLEXITY: Low   GOALS: Goals reviewed with patient? Yes  SHORT TERM GOALS: Target date: 11/01/22 Independent with initial HEP Goal status: Met 11/01/22  LONG TERM GOALS: Target date: 01/18/23  Independent with advanced HEP Goal status: INITIAL  2.  Increase Berg balance test score to 40/56 Goal status: INITIAL  3.  Decrease TUG time to 19 seconds Goal status: INITIAL  4.  Increase left shoulder IR to 60 degrees Goal status: INITIAL  5.  Increase LE strength to 4/5 Goal status: INITIAL  PLAN:  PT FREQUENCY: 1-2x/week  PT DURATION: 12 weeks  PLANNED INTERVENTIONS: Therapeutic exercises, Therapeutic activity, Neuromuscular re-education, Balance training, Gait training, Patient/Family education, Self Care, Joint mobilization, Stair training, and Manual therapy  PLAN FOR NEXT SESSION: start balance and LE strength, conitnue to look at and try to help left shoulder function   Grayce Sessions, PTA 11/01/2022, 11:46 AM

## 2022-11-05 NOTE — Therapy (Signed)
OUTPATIENT PHYSICAL THERAPY LOWER EXTREMITY TREATMENT   Patient Name: Harold Pittman MRN: [id number] DOB:06-15-1940, 82 y.o., male Today's Date: 11/06/2022  END OF SESSION:  PT End of Session - 11/06/22 1147     Visit Number 5    Date for PT Re-Evaluation 01/18/23    PT Start Time 1145    PT Stop Time 1230    PT Time Calculation (min) 45 min    Activity Tolerance Patient tolerated treatment well    Behavior During Therapy WFL for tasks assessed/performed              Past Medical History:  Diagnosis Date   Anemia    low iron   Anxiety state, unspecified    Blood in stool    Chronic airway obstruction, not elsewhere classified    Coronary artery disease    Coronary atherosclerosis of artery bypass graft    Dyspnea    ED (erectile dysfunction)    Esophageal reflux    Gout, unspecified    Osteoarthrosis, unspecified whether generalized or localized, unspecified site    Other and unspecified hyperlipidemia    Other specified disorder of rectum and anus    Personal history of tobacco use, presenting hazards to health    Sleep apnea    does not use cpap   Type II or unspecified type diabetes mellitus without mention of complication, not stated as uncontrolled    Unspecified essential hypertension    Past Surgical History:  Procedure Laterality Date   BIOPSY  09/26/2017   Procedure: BIOPSY;  Surgeon: Meryl Dare, MD;  Location: [facility] ENDOSCOPY;  Service: Endoscopy;;   BIOPSY  02/03/2018   Procedure: BIOPSY;  Surgeon: Lemar Lofty., MD;  Location: [facility] ENDOSCOPY;  Service: Gastroenterology;;   COLONOSCOPY WITH PROPOFOL N/A 09/26/2017   Procedure: COLONOSCOPY WITH PROPOFOL;  Surgeon: Meryl Dare, MD;  Location: [facility] ENDOSCOPY;  Service: Endoscopy;  Laterality: N/A;   CORONARY ARTERY BYPASS GRAFT  97   LIMA to LAD, sequential saphenous vein graft to the first and second diagonal, sequential saphenous vein graft to the intermediate OM and circumflex and SVG to  RCA   ESOPHAGOGASTRODUODENOSCOPY (EGD) WITH PROPOFOL N/A 09/26/2017   Procedure: ESOPHAGOGASTRODUODENOSCOPY (EGD) WITH PROPOFOL;  Surgeon: Meryl Dare, MD;  Location: [facility] ENDOSCOPY;  Service: Endoscopy;  Laterality: N/A;   ESOPHAGOGASTRODUODENOSCOPY (EGD) WITH PROPOFOL N/A 02/03/2018   Procedure: ESOPHAGOGASTRODUODENOSCOPY (EGD);  Surgeon: Lemar Lofty., MD;  Location: [facility] ENDOSCOPY;  Service: Gastroenterology;  Laterality: N/A;   KNEE SURGERY     BILATERAL   POLYPECTOMY  09/26/2017   Procedure: POLYPECTOMY;  Surgeon: Meryl Dare, MD;  Location: [facility] ENDOSCOPY;  Service: Endoscopy;;   ROTATOR CUFF REPAIR     SUBMUCOSAL INJECTION  02/03/2018   Procedure: SUBMUCOSAL INJECTION;  Surgeon: Lemar Lofty., MD;  Location: [facility] ENDOSCOPY;  Service: Gastroenterology;;   Patient Active Problem List   Diagnosis Date Noted   Acute gouty arthritis 03/28/2022   Gout flare 01/28/2022   Peripheral neuropathy 01/15/2022   Ataxia 01/15/2022   Type 2 diabetes mellitus (A1c 6.0 on 05/03/2021) with steroid-induced hyperglycemia 05/16/2021   CKD (chronic kidney disease) stage 4, GFR 15-29 ml/min (HCC) 05/15/2021   Olecranon bursitis, right elbow 05/15/2021   Tremor 05/15/2021   Elbow pain, left 12/05/2020   Constipation 12/05/2020   Atherosclerosis of aorta (HCC) 12/05/2020   Chest pain, atypical 12/05/2020   CTS (carpal tunnel syndrome) 03/24/2020   Grief at loss of child 03/24/2020  Cholelithiasis 09/16/2019   Nausea 09/02/2019   Weight loss 09/02/2019   Elevated troponin 01/12/2019   Hypertensive urgency 01/10/2019   Well adult exam 10/20/2018   Balanitis 10/20/2018   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/27/2017   Adenoma of stomach 12/25/2017   Abnormal findings on esophagogastroduodenoscopy (EGD) 12/25/2017   Sebaceous cyst 10/04/2017   Iron deficiency anemia    Benign neoplasm of sigmoid colon    Benign neoplasm of cecum    Benign neoplasm of transverse  colon    Positive occult stool blood test 09/24/2017   (HFpEF) heart failure with preserved ejection fraction (HCC) 09/24/2017   Hypokalemia 08/17/2015   Generalized weakness 07/22/2015   Gait disorder 03/18/2014   Low back pain 01/12/2014   History of colon polyps 12/17/2013   DM (diabetes mellitus), type 2 with peripheral vascular complications (HCC) 09/16/2013   Diarrhea 10/08/2012   Edema 10/08/2011   DOE (dyspnea on exertion) 06/29/2011   LATERAL EPICONDYLITIS, LEFT 02/20/2010   TOBACCO USE, QUIT 02/23/2009   Cough 02/16/2009   Hearing loss 10/15/2008   ERECTILE DYSFUNCTION 04/30/2008   Coronary artery disease involving native coronary artery of native heart without angina pectoris 04/30/2008   COPD (chronic obstructive pulmonary disease) (HCC) 05/21/2007   Anxiety state 05/20/2007   Insomnia 03/04/2007   Hyperlipidemia LDL goal <70 02/01/2007   Gout attack 02/01/2007   Essential hypertension 02/01/2007   GERD 02/01/2007   Osteoarthritis 02/01/2007    PCP: Plotnikov, MD  REFERRING PROVIDER: Plotnikov, MD  REFERRING DIAG: Ataxic gait  THERAPY DIAG:  Muscle weakness (generalized)  Difficulty in walking, not elsewhere classified  Other abnormalities of gait and mobility  Repeated falls  Stiffness of left shoulder, not elsewhere classified  Rationale for Evaluation and Treatment: Rehabilitation  ONSET DATE: 10/15/22  SUBJECTIVE:   SUBJECTIVE STATEMENT: I am alright, got a new cane   PERTINENT HISTORY: See above PAIN:  Are you having pain? Yes: NPRS scale: 0/10 Pain location: left shoulder upper arm Pain description: sharp Aggravating factors: reaching back to get wallet 6/10 Relieving factors: rest not moving no pain  PRECAUTIONS: Fall  RED FLAGS: None   WEIGHT BEARING RESTRICTIONS: No  FALLS:  Has patient fallen in last 6 months? Yes. Number of falls 2  LIVING ENVIRONMENT: Lives with: lives alone Lives in: House/apartment Stairs: No Has  following equipment at home: Single point cane, Environmental consultant - 4 wheeled, Marine scientist  OCCUPATION: retired Psychologist, occupational  PLOF: Independent  PATIENT GOALS: walk better, have better balance, reach wallet  NEXT MD VISIT: none  OBJECTIVE:   DIAGNOSTIC FINDINGS: none  COGNITION: Overall cognitive status: Within functional limits for tasks assessed     SENSATION: WFL  POSTURE: rounded shoulders and forward head  PALPATION: Non tender  LOWER EXTREMITY ROM: WFL's except ankle DF was unable to get to neutral and had very poor control of lateral motions  UPPER EXTREMITY ROM:  left shoulder flexion 140, abduction 130, ER 60, IR 20 degrees with IR painful  SHOULDER MMT:  flexion/abduction 3/5, ER 3+/5, IR 3+/5 pain   LOWER EXTREMITY MMT:  MMT Right eval Left eval  Hip flexion 4- 4-  Hip extension 4- 4-  Hip abduction 3+ 3+  Hip adduction    Hip internal rotation    Hip external rotation    Knee flexion 4- 4-  Knee extension 4- 4-  Ankle dorsiflexion 1 1  Ankle plantarflexion 2 2  Ankle inversion 3+ 3+  Ankle eversion 3+ 3+   (  Blank rows = not tested) FUNCTIONAL TESTS:  5 times sit to stand: 50 seconds had to use hands after multiple attmepts Timed up and go (TUG): 32 seconds with holding wall Berg Balance Scale: 27/56  GAIT: Distance walked: 100 feet Assistive device utilized: Single point cane Level of assistance: SBA Comments: slow, tends to use walls and furniture   TODAY'S TREATMENT:                                                                                                                              DATE:   11/06/22 NuStep L5 x65mins  HS curls 35lb 2x12 Leg Ext 10lb 2x12 STS 4x5 from elevated mat table, with 3s holds once her gets all the way up  Marching in bars Hip abd in bars Step ups 4" at stairs    11/01/22 Bike L4 x6 min HS curls 35lb 2x12 Leg Ext 10lb 2x12 Side step in // bars Tried gait in // frequent LOB Leg press 20lb 2x10 Alt 2in box  taps w/ SPC 4in step up HHA x 1    10/30/22 Bike L 3 x 6 min Leg press 20lb 2x12 Standing shoulder Ext 5b 2x10  S2S elevated surface  with focus on anterior weight shifts, not allowing LE to push against table. X10 x5  HS curls 35lb 2x12 Leg Ext 10lb 2x12 Alt 4 in box taps  Attempted sanding march with 2lb cuff and SPC multiple LOB when lifting LLE   10/23/22 NuStep L 5 x 6 min HS curls 25lb 2x10  Leg ext 10lb 2x10 S2S elevated surface with UE use 2x8 weak Supine   Supine bridges x10  LE on Pball bridges, oblq, K2C  LUE PROM  AAROM 3lb WaTE shoulder flex x10   LUE flex x5, 1lb x5  LUE WE IR 1lb x5 each Stading shoulder abd  PATIENT EDUCATION:  Education details: POC/HEP Person educated: Patient Education method: Programmer, multimedia, Facilities manager, Verbal cues, and Handouts Education comprehension: verbalized understanding  HOME EXERCISE PROGRAM: Access Code: [email or web address] URL: [email or web address] Date: 10/18/2022 Prepared by: Stacie Glaze  Exercises - Standing Hip Abduction with Counter Support  - 1 x daily - 7 x weekly - 2 sets - 10 reps - 3 hold - Standing March with Counter Support  - 1 x daily - 7 x weekly - 2 sets - 10 reps - 3 hold - Heel Raises with Counter Support  - 1 x daily - 7 x weekly - 2 sets - 10 reps - 3 hold  ASSESSMENT:  CLINICAL IMPRESSION: Patient is a 82 y.o. male who was seen today for physical therapy  treatment for poor gait, repeated falls and the left shoulder stiffness and weakness. Session consisted of LE strength and balance. He walked in with a quad cane today but tripped 2x walking from the lobby to the gym. Able to catch himself but still presents as high fall risk and does not clear his toes. Did well with  sit to stands from elevated mat able and pushing through legs. He was able to do some marching in the bars without UE hold, CGA. Advised that maybe a walker may be a better option for him for right now because of how off balance  he is.    OBJECTIVE IMPAIRMENTS: Abnormal gait, cardiopulmonary status limiting activity, decreased activity tolerance, decreased balance, decreased endurance, decreased mobility, difficulty walking, decreased ROM, decreased strength, increased muscle spasms, impaired flexibility, and pain.   REHAB POTENTIAL: Good  CLINICAL DECISION MAKING: Stable/uncomplicated  EVALUATION COMPLEXITY: Low   GOALS: Goals reviewed with patient? Yes  SHORT TERM GOALS: Target date: 11/01/22 Independent with initial HEP Goal status: Met 11/01/22  LONG TERM GOALS: Target date: 01/18/23  Independent with advanced HEP Goal status: INITIAL  2.  Increase Berg balance test score to 40/56 Goal status: INITIAL  3.  Decrease TUG time to 19 seconds Goal status: INITIAL  4.  Increase left shoulder IR to 60 degrees Goal status: INITIAL  5.  Increase LE strength to 4/5 Goal status: INITIAL  PLAN:  PT FREQUENCY: 1-2x/week  PT DURATION: 12 weeks  PLANNED INTERVENTIONS: Therapeutic exercises, Therapeutic activity, Neuromuscular re-education, Balance training, Gait training, Patient/Family education, Self Care, Joint mobilization, Stair training, and Manual therapy  PLAN FOR NEXT SESSION: start balance and LE strength, conitnue to look at and try to help left shoulder function   Cassie Freer, PT 11/06/2022, 12:30 PM

## 2022-11-06 ENCOUNTER — Ambulatory Visit: Payer: Medicare Other

## 2022-11-06 DIAGNOSIS — R296 Repeated falls: Secondary | ICD-10-CM | POA: Diagnosis not present

## 2022-11-06 DIAGNOSIS — R262 Difficulty in walking, not elsewhere classified: Secondary | ICD-10-CM

## 2022-11-06 DIAGNOSIS — M25612 Stiffness of left shoulder, not elsewhere classified: Secondary | ICD-10-CM

## 2022-11-06 DIAGNOSIS — R2689 Other abnormalities of gait and mobility: Secondary | ICD-10-CM | POA: Diagnosis not present

## 2022-11-06 DIAGNOSIS — M25512 Pain in left shoulder: Secondary | ICD-10-CM | POA: Diagnosis not present

## 2022-11-06 DIAGNOSIS — R270 Ataxia, unspecified: Secondary | ICD-10-CM | POA: Diagnosis not present

## 2022-11-06 DIAGNOSIS — M6281 Muscle weakness (generalized): Secondary | ICD-10-CM

## 2022-11-08 ENCOUNTER — Encounter: Payer: Self-pay | Admitting: Physical Therapy

## 2022-11-08 ENCOUNTER — Ambulatory Visit: Payer: Medicare Other | Attending: Internal Medicine | Admitting: Physical Therapy

## 2022-11-08 DIAGNOSIS — R2689 Other abnormalities of gait and mobility: Secondary | ICD-10-CM | POA: Diagnosis not present

## 2022-11-08 DIAGNOSIS — R296 Repeated falls: Secondary | ICD-10-CM | POA: Diagnosis not present

## 2022-11-08 DIAGNOSIS — R262 Difficulty in walking, not elsewhere classified: Secondary | ICD-10-CM | POA: Diagnosis not present

## 2022-11-08 DIAGNOSIS — M6281 Muscle weakness (generalized): Secondary | ICD-10-CM | POA: Insufficient documentation

## 2022-11-08 NOTE — Therapy (Signed)
OUTPATIENT PHYSICAL THERAPY LOWER EXTREMITY TREATMENT   Patient Name: Harold Pittman MRN: [id number] DOB:[birth year], 82 y.o., male Today's Date: 11/08/2022  END OF SESSION:  PT End of Session - 11/08/22 1100     Visit Number 6    Date for PT Re-Evaluation 01/18/23    PT Start Time 1100    PT Stop Time 1145    PT Time Calculation (min) 45 min    Activity Tolerance Patient tolerated treatment well    Behavior During Therapy WFL for tasks assessed/performed              Past Medical History:  Diagnosis Date   Anemia    low iron   Anxiety state, unspecified    Blood in stool    Chronic airway obstruction, not elsewhere classified    Coronary artery disease    Coronary atherosclerosis of artery bypass graft    Dyspnea    ED (erectile dysfunction)    Esophageal reflux    Gout, unspecified    Osteoarthrosis, unspecified whether generalized or localized, unspecified site    Other and unspecified hyperlipidemia    Other specified disorder of rectum and anus    Personal history of tobacco use, presenting hazards to health    Sleep apnea    does not use cpap   Type II or unspecified type diabetes mellitus without mention of complication, not stated as uncontrolled    Unspecified essential hypertension    Past Surgical History:  Procedure Laterality Date   BIOPSY  09/26/2017   Procedure: BIOPSY;  Surgeon: Meryl Dare, MD;  Location: [facility] ENDOSCOPY;  Service: Endoscopy;;   BIOPSY  02/03/2018   Procedure: BIOPSY;  Surgeon: Lemar Lofty., MD;  Location: [facility] ENDOSCOPY;  Service: Gastroenterology;;   COLONOSCOPY WITH PROPOFOL N/A 09/26/2017   Procedure: COLONOSCOPY WITH PROPOFOL;  Surgeon: Meryl Dare, MD;  Location: [facility] ENDOSCOPY;  Service: Endoscopy;  Laterality: N/A;   CORONARY ARTERY BYPASS GRAFT  97   LIMA to LAD, sequential saphenous vein graft to the first and second diagonal, sequential saphenous vein graft to the intermediate OM and circumflex and SVG to  RCA   ESOPHAGOGASTRODUODENOSCOPY (EGD) WITH PROPOFOL N/A 09/26/2017   Procedure: ESOPHAGOGASTRODUODENOSCOPY (EGD) WITH PROPOFOL;  Surgeon: Meryl Dare, MD;  Location: [facility] ENDOSCOPY;  Service: Endoscopy;  Laterality: N/A;   ESOPHAGOGASTRODUODENOSCOPY (EGD) WITH PROPOFOL N/A 02/03/2018   Procedure: ESOPHAGOGASTRODUODENOSCOPY (EGD);  Surgeon: Lemar Lofty., MD;  Location: [facility] ENDOSCOPY;  Service: Gastroenterology;  Laterality: N/A;   KNEE SURGERY     BILATERAL   POLYPECTOMY  09/26/2017   Procedure: POLYPECTOMY;  Surgeon: Meryl Dare, MD;  Location: [facility] ENDOSCOPY;  Service: Endoscopy;;   ROTATOR CUFF REPAIR     SUBMUCOSAL INJECTION  02/03/2018   Procedure: SUBMUCOSAL INJECTION;  Surgeon: Lemar Lofty., MD;  Location: [facility] ENDOSCOPY;  Service: Gastroenterology;;   Patient Active Problem List   Diagnosis Date Noted   Acute gouty arthritis 03/28/2022   Gout flare 01/28/2022   Peripheral neuropathy 01/15/2022   Ataxia 01/15/2022   Type 2 diabetes mellitus (A1c 6.0 on 05/03/2021) with steroid-induced hyperglycemia 05/16/2021   CKD (chronic kidney disease) stage 4, GFR 15-29 ml/min (HCC) 05/15/2021   Olecranon bursitis, right elbow 05/15/2021   Tremor 05/15/2021   Elbow pain, left 12/05/2020   Constipation 12/05/2020   Atherosclerosis of aorta (HCC) 12/05/2020   Chest pain, atypical 12/05/2020   CTS (carpal tunnel syndrome) 03/24/2020   Grief at loss of child 03/24/2020  Cholelithiasis 09/16/2019   Nausea 09/02/2019   Weight loss 09/02/2019   Elevated troponin 01/12/2019   Hypertensive urgency 01/10/2019   Well adult exam 10/20/2018   Balanitis 10/20/2018   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/27/2017   Adenoma of stomach 12/25/2017   Abnormal findings on esophagogastroduodenoscopy (EGD) 12/25/2017   Sebaceous cyst 10/04/2017   Iron deficiency anemia    Benign neoplasm of sigmoid colon    Benign neoplasm of cecum    Benign neoplasm of transverse  colon    Positive occult stool blood test 09/24/2017   (HFpEF) heart failure with preserved ejection fraction (HCC) 09/24/2017   Hypokalemia 08/17/2015   Generalized weakness 07/22/2015   Gait disorder 03/18/2014   Low back pain 01/12/2014   History of colon polyps 12/17/2013   DM (diabetes mellitus), type 2 with peripheral vascular complications (HCC) 09/16/2013   Diarrhea 10/08/2012   Edema 10/08/2011   DOE (dyspnea on exertion) 06/29/2011   LATERAL EPICONDYLITIS, LEFT 02/20/2010   TOBACCO USE, QUIT 02/23/2009   Cough 02/16/2009   Hearing loss 10/15/2008   ERECTILE DYSFUNCTION 04/30/2008   Coronary artery disease involving native coronary artery of native heart without angina pectoris 04/30/2008   COPD (chronic obstructive pulmonary disease) (HCC) 05/21/2007   Anxiety state 05/20/2007   Insomnia 03/04/2007   Hyperlipidemia LDL goal <70 02/01/2007   Gout attack 02/01/2007   Essential hypertension 02/01/2007   GERD 02/01/2007   Osteoarthritis 02/01/2007    PCP: Posey Rea, MD  REFERRING PROVIDER: Plotnikov, MD  REFERRING DIAG: Ataxic gait  THERAPY DIAG:  Muscle weakness (generalized)  Difficulty in walking, not elsewhere classified  Repeated falls  Rationale for Evaluation and Treatment: Rehabilitation  ONSET DATE: 10/15/22  SUBJECTIVE:   SUBJECTIVE STATEMENT: "I feel good"   PERTINENT HISTORY: See above PAIN:  Are you having pain? Yes: NPRS scale: 0/10 Pain location: left shoulder upper arm Pain description: sharp Aggravating factors: reaching back to get wallet 6/10 Relieving factors: rest not moving no pain  PRECAUTIONS: Fall  RED FLAGS: None   WEIGHT BEARING RESTRICTIONS: No  FALLS:  Has patient fallen in last 6 months? Yes. Number of falls 2  LIVING ENVIRONMENT: Lives with: lives alone Lives in: House/apartment Stairs: No Has following equipment at home: Single point cane, Environmental consultant - 4 wheeled, Marine scientist  OCCUPATION: retired  Psychologist, occupational  PLOF: Independent  PATIENT GOALS: walk better, have better balance, reach wallet  NEXT MD VISIT: none  OBJECTIVE:   DIAGNOSTIC FINDINGS: none  COGNITION: Overall cognitive status: Within functional limits for tasks assessed     SENSATION: WFL  POSTURE: rounded shoulders and forward head  PALPATION: Non tender  LOWER EXTREMITY ROM: WFL's except ankle DF was unable to get to neutral and had very poor control of lateral motions  UPPER EXTREMITY ROM:  left shoulder flexion 140, abduction 130, ER 60, IR 20 degrees with IR painful  SHOULDER MMT:  flexion/abduction 3/5, ER 3+/5, IR 3+/5 pain   LOWER EXTREMITY MMT:  MMT Right eval Left eval  Hip flexion 4- 4-  Hip extension 4- 4-  Hip abduction 3+ 3+  Hip adduction    Hip internal rotation    Hip external rotation    Knee flexion 4- 4-  Knee extension 4- 4-  Ankle dorsiflexion 1 1  Ankle plantarflexion 2 2  Ankle inversion 3+ 3+  Ankle eversion 3+ 3+   (Blank rows = not tested) FUNCTIONAL TESTS:  5 times sit to stand: 50 seconds had to use hands  after multiple attmepts Timed up and go (TUG): 32 seconds with holding wall Berg Balance Scale: 27/56  GAIT: Distance walked: 100 feet Assistive device utilized: Single point cane Level of assistance: SBA Comments: slow, tends to use walls and furniture   TODAY'S TREATMENT:                                                                                                                              DATE:   11/08/22 NuStep L5 x63mins  HS curls 35lb 2x12 Leg Ext 10lb 2x12  STS x10,x5 from elevated mat table, with 3s holds once her gets all the way up  Standing march 3lb cuff 2x10 in RW Side step at mat table  Leg press 30lb 3x12  11/06/22 NuStep L5 x20mins  HS curls 35lb 2x12 Leg Ext 10lb 2x12 STS 4x5 from elevated mat table, with 3s holds once her gets all the way up  Marching in bars Hip abd in bars Step ups 4" at stairs    11/01/22 Bike L4 x6 min HS  curls 35lb 2x12 Leg Ext 10lb 2x12 Side step in // bars Tried gait in // frequent LOB Leg press 20lb 2x10 Alt 2in box taps w/ SPC 4in step up HHA x 1    10/30/22 Bike L 3 x 6 min Leg press 20lb 2x12 Standing shoulder Ext 5b 2x10  S2S elevated surface  with focus on anterior weight shifts, not allowing LE to push against table. X10 x5  HS curls 35lb 2x12 Leg Ext 10lb 2x12 Alt 4 in box taps  Attempted sanding march with 2lb cuff and SPC multiple LOB when lifting LLE   10/23/22 NuStep L 5 x 6 min HS curls 25lb 2x10  Leg ext 10lb 2x10 S2S elevated surface with UE use 2x8 weak Supine   Supine bridges x10  LE on Pball bridges, oblq, K2C  LUE PROM  AAROM 3lb WaTE shoulder flex x10   LUE flex x5, 1lb x5  LUE WE IR 1lb x5 each Stading shoulder abd  PATIENT EDUCATION:  Education details: POC/HEP Person educated: Patient Education method: Programmer, multimedia, Facilities manager, Verbal cues, and Handouts Education comprehension: verbalized understanding  HOME EXERCISE PROGRAM: Access Code: [email or web address] URL: [email or web address] Date: 10/18/2022 Prepared by: Stacie Glaze  Exercises - Standing Hip Abduction with Counter Support  - 1 x daily - 7 x weekly - 2 sets - 10 reps - 3 hold - Standing March with Counter Support  - 1 x daily - 7 x weekly - 2 sets - 10 reps - 3 hold - Heel Raises with Counter Support  - 1 x daily - 7 x weekly - 2 sets - 10 reps - 3 hold  ASSESSMENT:  CLINICAL IMPRESSION: Patient is a 82 y.o. male who was seen today for physical therapy  treatment for poor gait, repeated falls and the left shoulder stiffness and weakness. Session consisted of LE strength and balance. Pt enters ambulating with RW, appeared to be  more stable compared to [facility]. Did well with sit to stands from elevated mat able and pushing through legs, LE does push against table at times as he fatigues. Good knee elevation noted with stand march. No pain during session.  OBJECTIVE  IMPAIRMENTS: Abnormal gait, cardiopulmonary status limiting activity, decreased activity tolerance, decreased balance, decreased endurance, decreased mobility, difficulty walking, decreased ROM, decreased strength, increased muscle spasms, impaired flexibility, and pain.   REHAB POTENTIAL: Good  CLINICAL DECISION MAKING: Stable/uncomplicated  EVALUATION COMPLEXITY: Low   GOALS: Goals reviewed with patient? Yes  SHORT TERM GOALS: Target date: 11/01/22 Independent with initial HEP Goal status: Met 11/01/22  LONG TERM GOALS: Target date: 01/18/23  Independent with advanced HEP Goal status: INITIAL  2.  Increase Berg balance test score to 40/56 Goal status: INITIAL  3.  Decrease TUG time to 19 seconds Goal status: INITIAL  4.  Increase left shoulder IR to 60 degrees Goal status: INITIAL  5.  Increase LE strength to 4/5 Goal status: INITIAL  PLAN:  PT FREQUENCY: 1-2x/week  PT DURATION: 12 weeks  PLANNED INTERVENTIONS: Therapeutic exercises, Therapeutic activity, Neuromuscular re-education, Balance training, Gait training, Patient/Family education, Self Care, Joint mobilization, Stair training, and Manual therapy  PLAN FOR NEXT SESSION: start balance and LE strength, conitnue to look at and try to help left shoulder function   Grayce Sessions, PTA 11/08/2022, 11:00 AM

## 2022-11-13 ENCOUNTER — Ambulatory Visit: Payer: Medicare Other | Admitting: Physical Therapy

## 2022-11-13 ENCOUNTER — Encounter: Payer: Self-pay | Admitting: Physical Therapy

## 2022-11-13 DIAGNOSIS — M6281 Muscle weakness (generalized): Secondary | ICD-10-CM

## 2022-11-13 DIAGNOSIS — R2689 Other abnormalities of gait and mobility: Secondary | ICD-10-CM | POA: Diagnosis not present

## 2022-11-13 DIAGNOSIS — R296 Repeated falls: Secondary | ICD-10-CM

## 2022-11-13 DIAGNOSIS — R262 Difficulty in walking, not elsewhere classified: Secondary | ICD-10-CM

## 2022-11-13 NOTE — Therapy (Signed)
OUTPATIENT PHYSICAL THERAPY LOWER EXTREMITY TREATMENT   Patient Name: Harold Pittman MRN: [id number] DOB:08-02-1940, 82 y.o., male Today's Date: 11/13/2022  END OF SESSION:  PT End of Session - 11/13/22 1143     Visit Number 7    Date for PT Re-Evaluation 01/18/23    PT Start Time 1140    PT Stop Time 1225    PT Time Calculation (min) 45 min    Activity Tolerance Patient tolerated treatment well    Behavior During Therapy WFL for tasks assessed/performed              Past Medical History:  Diagnosis Date   Anemia    low iron   Anxiety state, unspecified    Blood in stool    Chronic airway obstruction, not elsewhere classified    Coronary artery disease    Coronary atherosclerosis of artery bypass graft    Dyspnea    ED (erectile dysfunction)    Esophageal reflux    Gout, unspecified    Osteoarthrosis, unspecified whether generalized or localized, unspecified site    Other and unspecified hyperlipidemia    Other specified disorder of rectum and anus    Personal history of tobacco use, presenting hazards to health    Sleep apnea    does not use cpap   Type II or unspecified type diabetes mellitus without mention of complication, not stated as uncontrolled    Unspecified essential hypertension    Past Surgical History:  Procedure Laterality Date   BIOPSY  09/26/2017   Procedure: BIOPSY;  Surgeon: Meryl Dare, MD;  Location: [facility] ENDOSCOPY;  Service: Endoscopy;;   BIOPSY  02/03/2018   Procedure: BIOPSY;  Surgeon: Lemar Lofty., MD;  Location: [facility] ENDOSCOPY;  Service: Gastroenterology;;   COLONOSCOPY WITH PROPOFOL N/A 09/26/2017   Procedure: COLONOSCOPY WITH PROPOFOL;  Surgeon: Meryl Dare, MD;  Location: [facility] ENDOSCOPY;  Service: Endoscopy;  Laterality: N/A;   CORONARY ARTERY BYPASS GRAFT  97   LIMA to LAD, sequential saphenous vein graft to the first and second diagonal, sequential saphenous vein graft to the intermediate OM and circumflex and SVG to  RCA   ESOPHAGOGASTRODUODENOSCOPY (EGD) WITH PROPOFOL N/A 09/26/2017   Procedure: ESOPHAGOGASTRODUODENOSCOPY (EGD) WITH PROPOFOL;  Surgeon: Meryl Dare, MD;  Location: [facility] ENDOSCOPY;  Service: Endoscopy;  Laterality: N/A;   ESOPHAGOGASTRODUODENOSCOPY (EGD) WITH PROPOFOL N/A 02/03/2018   Procedure: ESOPHAGOGASTRODUODENOSCOPY (EGD);  Surgeon: Lemar Lofty., MD;  Location: [facility] ENDOSCOPY;  Service: Gastroenterology;  Laterality: N/A;   KNEE SURGERY     BILATERAL   POLYPECTOMY  09/26/2017   Procedure: POLYPECTOMY;  Surgeon: Meryl Dare, MD;  Location: [facility] ENDOSCOPY;  Service: Endoscopy;;   ROTATOR CUFF REPAIR     SUBMUCOSAL INJECTION  02/03/2018   Procedure: SUBMUCOSAL INJECTION;  Surgeon: Lemar Lofty., MD;  Location: [facility] ENDOSCOPY;  Service: Gastroenterology;;   Patient Active Problem List   Diagnosis Date Noted   Acute gouty arthritis 03/28/2022   Gout flare 01/28/2022   Peripheral neuropathy 01/15/2022   Ataxia 01/15/2022   Type 2 diabetes mellitus (A1c 6.0 on 05/03/2021) with steroid-induced hyperglycemia 05/16/2021   CKD (chronic kidney disease) stage 4, GFR 15-29 ml/min (HCC) 05/15/2021   Olecranon bursitis, right elbow 05/15/2021   Tremor 05/15/2021   Elbow pain, left 12/05/2020   Constipation 12/05/2020   Atherosclerosis of aorta (HCC) 12/05/2020   Chest pain, atypical 12/05/2020   CTS (carpal tunnel syndrome) 03/24/2020   Grief at loss of child 03/24/2020  Cholelithiasis 09/16/2019   Nausea 09/02/2019   Weight loss 09/02/2019   Elevated troponin 01/12/2019   Hypertensive urgency 01/10/2019   Well adult exam 10/20/2018   Balanitis 10/20/2018   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/27/2017   Adenoma of stomach 12/25/2017   Abnormal findings on esophagogastroduodenoscopy (EGD) 12/25/2017   Sebaceous cyst 10/04/2017   Iron deficiency anemia    Benign neoplasm of sigmoid colon    Benign neoplasm of cecum    Benign neoplasm of transverse  colon    Positive occult stool blood test 09/24/2017   (HFpEF) heart failure with preserved ejection fraction (HCC) 09/24/2017   Hypokalemia 08/17/2015   Generalized weakness 07/22/2015   Gait disorder 03/18/2014   Low back pain 01/12/2014   History of colon polyps 12/17/2013   DM (diabetes mellitus), type 2 with peripheral vascular complications (HCC) 09/16/2013   Diarrhea 10/08/2012   Edema 10/08/2011   DOE (dyspnea on exertion) 06/29/2011   LATERAL EPICONDYLITIS, LEFT 02/20/2010   TOBACCO USE, QUIT 02/23/2009   Cough 02/16/2009   Hearing loss 10/15/2008   ERECTILE DYSFUNCTION 04/30/2008   Coronary artery disease involving native coronary artery of native heart without angina pectoris 04/30/2008   COPD (chronic obstructive pulmonary disease) (HCC) 05/21/2007   Anxiety state 05/20/2007   Insomnia 03/04/2007   Hyperlipidemia LDL goal <70 02/01/2007   Gout attack 02/01/2007   Essential hypertension 02/01/2007   GERD 02/01/2007   Osteoarthritis 02/01/2007    PCP: Posey Rea, MD  REFERRING PROVIDER: Plotnikov, MD  REFERRING DIAG: Ataxic gait  THERAPY DIAG:  Muscle weakness (generalized)  Repeated falls  Difficulty in walking, not elsewhere classified  Rationale for Evaluation and Treatment: Rehabilitation  ONSET DATE: 10/15/22  SUBJECTIVE:   SUBJECTIVE STATEMENT: "I feel good"   PERTINENT HISTORY: See above PAIN:  Are you having pain? Yes: NPRS scale: 0/10 Pain location: left shoulder upper arm Pain description: sharp Aggravating factors: reaching back to get wallet 6/10 Relieving factors: rest not moving no pain  PRECAUTIONS: Fall  RED FLAGS: None   WEIGHT BEARING RESTRICTIONS: No  FALLS:  Has patient fallen in last 6 months? Yes. Number of falls 2  LIVING ENVIRONMENT: Lives with: lives alone Lives in: House/apartment Stairs: No Has following equipment at home: Single point cane, Environmental consultant - 4 wheeled, Marine scientist  OCCUPATION: retired  Psychologist, occupational  PLOF: Independent  PATIENT GOALS: walk better, have better balance, reach wallet  NEXT MD VISIT: none  OBJECTIVE:   DIAGNOSTIC FINDINGS: none  COGNITION: Overall cognitive status: Within functional limits for tasks assessed     SENSATION: WFL  POSTURE: rounded shoulders and forward head  PALPATION: Non tender  LOWER EXTREMITY ROM: WFL's except ankle DF was unable to get to neutral and had very poor control of lateral motions  UPPER EXTREMITY ROM:  left shoulder flexion 140, abduction 130, ER 60, IR 20 degrees with IR painful  SHOULDER MMT:  flexion/abduction 3/5, ER 3+/5, IR 3+/5 pain   LOWER EXTREMITY MMT:  MMT Right eval Left eval  Hip flexion 4- 4-  Hip extension 4- 4-  Hip abduction 3+ 3+  Hip adduction    Hip internal rotation    Hip external rotation    Knee flexion 4- 4-  Knee extension 4- 4-  Ankle dorsiflexion 1 1  Ankle plantarflexion 2 2  Ankle inversion 3+ 3+  Ankle eversion 3+ 3+   (Blank rows = not tested) FUNCTIONAL TESTS:  5 times sit to stand: 50 seconds had to use hands  after multiple attmepts Timed up and go (TUG): 32 seconds with holding wall Berg Balance Scale: 27/56  GAIT: Distance walked: 100 feet Assistive device utilized: Single point cane Level of assistance: SBA Comments: slow, tends to use walls and furniture   TODAY'S TREATMENT:                                                                                                                              DATE 11/13/22 NuStep L5 x 7 min HS curls 35lb 2x15 Leg Ext 10lb 2x15 S2S elevated mat 2x10 Standing march & abd 3lb cuff 2x10 in RW  11/08/22 NuStep L5 x64mins  HS curls 35lb 2x12 Leg Ext 10lb 2x12  STS x10,x5 from elevated mat table, with 3s holds once her gets all the way up  Standing march 3lb cuff 2x10 in RW Side step at mat table  Leg press 30lb 3x12  11/06/22 NuStep L5 x35mins  HS curls 35lb 2x12 Leg Ext 10lb 2x12 STS 4x5 from elevated mat table, with  3s holds once her gets all the way up  Marching in bars Hip abd in bars Step ups 4" at stairs    11/01/22 Bike L4 x6 min HS curls 35lb 2x12 Leg Ext 10lb 2x12 Side step in // bars Tried gait in // frequent LOB Leg press 20lb 2x10 Alt 2in box taps w/ SPC 4in step up HHA x 1    10/30/22 Bike L 3 x 6 min Leg press 20lb 2x12 Standing shoulder Ext 5b 2x10  S2S elevated surface  with focus on anterior weight shifts, not allowing LE to push against table. X10 x5  HS curls 35lb 2x12 Leg Ext 10lb 2x12 Alt 4 in box taps  Attempted sanding march with 2lb cuff and SPC multiple LOB when lifting LLE   10/23/22 NuStep L 5 x 6 min HS curls 25lb 2x10  Leg ext 10lb 2x10 S2S elevated surface with UE use 2x8 weak Supine   Supine bridges x10  LE on Pball bridges, oblq, K2C  LUE PROM  AAROM 3lb WaTE shoulder flex x10   LUE flex x5, 1lb x5  LUE WE IR 1lb x5 each Stading shoulder abd  PATIENT EDUCATION:  Education details: POC/HEP Person educated: Patient Education method: Programmer, multimedia, Facilities manager, Verbal cues, and Handouts Education comprehension: verbalized understanding  HOME EXERCISE PROGRAM: Access Code: [email or web address] URL: [email or web address] Date: 10/18/2022 Prepared by: Stacie Glaze  Exercises - Standing Hip Abduction with Counter Support  - 1 x daily - 7 x weekly - 2 sets - 10 reps - 3 hold - Standing March with Counter Support  - 1 x daily - 7 x weekly - 2 sets - 10 reps - 3 hold - Heel Raises with Counter Support  - 1 x daily - 7 x weekly - 2 sets - 10 reps - 3 hold  ASSESSMENT:  CLINICAL IMPRESSION: Patient is a 82 y.o. male who was seen today for physical therapy  treatment for  poor gait, repeated falls and the left shoulder stiffness and weakness. Session consisted of LE strength and balance. Again t enters ambulating with RW, appeared to be more stable compared to [facility]. Did well with sit to stands from elevated mat able and pushing through legs. Verbal  and tactile cues needed not too externally rotate LLE with his abduction. CGA to SBA needed with side steps. No pain during session.  OBJECTIVE IMPAIRMENTS: Abnormal gait, cardiopulmonary status limiting activity, decreased activity tolerance, decreased balance, decreased endurance, decreased mobility, difficulty walking, decreased ROM, decreased strength, increased muscle spasms, impaired flexibility, and pain.   REHAB POTENTIAL: Good  CLINICAL DECISION MAKING: Stable/uncomplicated  EVALUATION COMPLEXITY: Low   GOALS: Goals reviewed with patient? Yes  SHORT TERM GOALS: Target date: 11/01/22 Independent with initial HEP Goal status: Met 11/01/22  LONG TERM GOALS: Target date: 01/18/23  Independent with advanced HEP Goal status: INITIAL  2.  Increase Berg balance test score to 40/56 Goal status: INITIAL  3.  Decrease TUG time to 19 seconds Goal status: INITIAL  4.  Increase left shoulder IR to 60 degrees Goal status: INITIAL  5.  Increase LE strength to 4/5 Goal status: INITIAL  PLAN:  PT FREQUENCY: 1-2x/week  PT DURATION: 12 weeks  PLANNED INTERVENTIONS: Therapeutic exercises, Therapeutic activity, Neuromuscular re-education, Balance training, Gait training, Patient/Family education, Self Care, Joint mobilization, Stair training, and Manual therapy  PLAN FOR NEXT SESSION: start balance and LE strength, conitnue to look at and try to help left shoulder function   Grayce Sessions, PTA 11/13/2022, 11:44 AM

## 2022-11-15 ENCOUNTER — Ambulatory Visit: Payer: Medicare Other

## 2022-11-20 ENCOUNTER — Ambulatory Visit: Payer: Medicare Other

## 2022-11-20 DIAGNOSIS — R262 Difficulty in walking, not elsewhere classified: Secondary | ICD-10-CM | POA: Diagnosis not present

## 2022-11-20 DIAGNOSIS — R2689 Other abnormalities of gait and mobility: Secondary | ICD-10-CM

## 2022-11-20 DIAGNOSIS — M6281 Muscle weakness (generalized): Secondary | ICD-10-CM | POA: Diagnosis not present

## 2022-11-20 DIAGNOSIS — R296 Repeated falls: Secondary | ICD-10-CM | POA: Diagnosis not present

## 2022-11-20 NOTE — Therapy (Signed)
OUTPATIENT PHYSICAL THERAPY LOWER EXTREMITY TREATMENT   Patient Name: Harold Pittman MRN: [id number] DOB:05-Jun-1940, 82 y.o., male Today's Date: 11/20/2022  END OF SESSION:  PT End of Session - 11/20/22 1046     Visit Number 8    Date for PT Re-Evaluation 01/18/23    PT Start Time 1050    PT Stop Time 1135    PT Time Calculation (min) 45 min    Activity Tolerance Patient tolerated treatment well    Behavior During Therapy WFL for tasks assessed/performed               Past Medical History:  Diagnosis Date   Anemia    low iron   Anxiety state, unspecified    Blood in stool    Chronic airway obstruction, not elsewhere classified    Coronary artery disease    Coronary atherosclerosis of artery bypass graft    Dyspnea    ED (erectile dysfunction)    Esophageal reflux    Gout, unspecified    Osteoarthrosis, unspecified whether generalized or localized, unspecified site    Other and unspecified hyperlipidemia    Other specified disorder of rectum and anus    Personal history of tobacco use, presenting hazards to health    Sleep apnea    does not use cpap   Type II or unspecified type diabetes mellitus without mention of complication, not stated as uncontrolled    Unspecified essential hypertension    Past Surgical History:  Procedure Laterality Date   BIOPSY  09/26/2017   Procedure: BIOPSY;  Surgeon: Meryl Dare, MD;  Location: [facility] ENDOSCOPY;  Service: Endoscopy;;   BIOPSY  02/03/2018   Procedure: BIOPSY;  Surgeon: Lemar Lofty., MD;  Location: [facility] ENDOSCOPY;  Service: Gastroenterology;;   COLONOSCOPY WITH PROPOFOL N/A 09/26/2017   Procedure: COLONOSCOPY WITH PROPOFOL;  Surgeon: Meryl Dare, MD;  Location: [facility] ENDOSCOPY;  Service: Endoscopy;  Laterality: N/A;   CORONARY ARTERY BYPASS GRAFT  97   LIMA to LAD, sequential saphenous vein graft to the first and second diagonal, sequential saphenous vein graft to the intermediate OM and circumflex and SVG  to RCA   ESOPHAGOGASTRODUODENOSCOPY (EGD) WITH PROPOFOL N/A 09/26/2017   Procedure: ESOPHAGOGASTRODUODENOSCOPY (EGD) WITH PROPOFOL;  Surgeon: Meryl Dare, MD;  Location: [facility] ENDOSCOPY;  Service: Endoscopy;  Laterality: N/A;   ESOPHAGOGASTRODUODENOSCOPY (EGD) WITH PROPOFOL N/A 02/03/2018   Procedure: ESOPHAGOGASTRODUODENOSCOPY (EGD);  Surgeon: Lemar Lofty., MD;  Location: [facility] ENDOSCOPY;  Service: Gastroenterology;  Laterality: N/A;   KNEE SURGERY     BILATERAL   POLYPECTOMY  09/26/2017   Procedure: POLYPECTOMY;  Surgeon: Meryl Dare, MD;  Location: [facility] ENDOSCOPY;  Service: Endoscopy;;   ROTATOR CUFF REPAIR     SUBMUCOSAL INJECTION  02/03/2018   Procedure: SUBMUCOSAL INJECTION;  Surgeon: Lemar Lofty., MD;  Location: [facility] ENDOSCOPY;  Service: Gastroenterology;;   Patient Active Problem List   Diagnosis Date Noted   Acute gouty arthritis 03/28/2022   Gout flare 01/28/2022   Peripheral neuropathy 01/15/2022   Ataxia 01/15/2022   Type 2 diabetes mellitus (A1c 6.0 on 05/03/2021) with steroid-induced hyperglycemia 05/16/2021   CKD (chronic kidney disease) stage 4, GFR 15-29 ml/min (HCC) 05/15/2021   Olecranon bursitis, right elbow 05/15/2021   Tremor 05/15/2021   Elbow pain, left 12/05/2020   Constipation 12/05/2020   Atherosclerosis of aorta (HCC) 12/05/2020   Chest pain, atypical 12/05/2020   CTS (carpal tunnel syndrome) 03/24/2020   Grief at loss of child 03/24/2020  Cholelithiasis 09/16/2019   Nausea 09/02/2019   Weight loss 09/02/2019   Elevated troponin 01/12/2019   Hypertensive urgency 01/10/2019   Well adult exam 10/20/2018   Balanitis 10/20/2018   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/27/2017   Adenoma of stomach 12/25/2017   Abnormal findings on esophagogastroduodenoscopy (EGD) 12/25/2017   Sebaceous cyst 10/04/2017   Iron deficiency anemia    Benign neoplasm of sigmoid colon    Benign neoplasm of cecum    Benign neoplasm of  transverse colon    Positive occult stool blood test 09/24/2017   (HFpEF) heart failure with preserved ejection fraction (HCC) 09/24/2017   Hypokalemia 08/17/2015   Generalized weakness 07/22/2015   Gait disorder 03/18/2014   Low back pain 01/12/2014   History of colon polyps 12/17/2013   DM (diabetes mellitus), type 2 with peripheral vascular complications (HCC) 09/16/2013   Diarrhea 10/08/2012   Edema 10/08/2011   DOE (dyspnea on exertion) 06/29/2011   LATERAL EPICONDYLITIS, LEFT 02/20/2010   TOBACCO USE, QUIT 02/23/2009   Cough 02/16/2009   Hearing loss 10/15/2008   ERECTILE DYSFUNCTION 04/30/2008   Coronary artery disease involving native coronary artery of native heart without angina pectoris 04/30/2008   COPD (chronic obstructive pulmonary disease) (HCC) 05/21/2007   Anxiety state 05/20/2007   Insomnia 03/04/2007   Hyperlipidemia LDL goal <70 02/01/2007   Gout attack 02/01/2007   Essential hypertension 02/01/2007   GERD 02/01/2007   Osteoarthritis 02/01/2007    PCP: Plotnikov, MD  REFERRING PROVIDER: Plotnikov, MD  REFERRING DIAG: Ataxic gait  THERAPY DIAG:  Muscle weakness (generalized)  Repeated falls  Difficulty in walking, not elsewhere classified  Other abnormalities of gait and mobility  Rationale for Evaluation and Treatment: Rehabilitation  ONSET DATE: 10/15/22  SUBJECTIVE:   SUBJECTIVE STATEMENT: Doing great, walking better with walker.   PERTINENT HISTORY: See above PAIN:  Are you having pain? Yes: NPRS scale: 0/10 Pain location: left shoulder upper arm Pain description: sharp Aggravating factors: reaching back to get wallet 6/10 Relieving factors: rest not moving no pain  PRECAUTIONS: Fall  RED FLAGS: None   WEIGHT BEARING RESTRICTIONS: No  FALLS:  Has patient fallen in last 6 months? Yes. Number of falls 2  LIVING ENVIRONMENT: Lives with: lives alone Lives in: House/apartment Stairs: No Has following equipment at home: Single  point cane, Environmental consultant - 4 wheeled, Marine scientist  OCCUPATION: retired Psychologist, occupational  PLOF: Independent  PATIENT GOALS: walk better, have better balance, reach wallet  NEXT MD VISIT: none  OBJECTIVE:   DIAGNOSTIC FINDINGS: none  COGNITION: Overall cognitive status: Within functional limits for tasks assessed     SENSATION: WFL  POSTURE: rounded shoulders and forward head  PALPATION: Non tender  LOWER EXTREMITY ROM: WFL's except ankle DF was unable to get to neutral and had very poor control of lateral motions  UPPER EXTREMITY ROM:  left shoulder flexion 140, abduction 130, ER 60, IR 20 degrees with IR painful  SHOULDER MMT:  flexion/abduction 3/5, ER 3+/5, IR 3+/5 pain   LOWER EXTREMITY MMT:  MMT Right eval Left eval  Hip flexion 4- 4-  Hip extension 4- 4-  Hip abduction 3+ 3+  Hip adduction    Hip internal rotation    Hip external rotation    Knee flexion 4- 4-  Knee extension 4- 4-  Ankle dorsiflexion 1 1  Ankle plantarflexion 2 2  Ankle inversion 3+ 3+  Ankle eversion 3+ 3+   (Blank rows = not tested) FUNCTIONAL TESTS:  5  times sit to stand: 50 seconds had to use hands after multiple attmepts Timed up and go (TUG): 32 seconds with holding wall Berg Balance Scale: 27/56  GAIT: Distance walked: 100 feet Assistive device utilized: Single point cane Level of assistance: SBA Comments: slow, tends to use walls and furniture   TODAY'S TREATMENT:                                                                                                                              DATE 11/20/22 NuStep L5x  3# marching 20 reps alt x2 3# hip abd 2x10 STS 2x10 Leg ext 10 2x15 HS curls 35# 2x15 Leg press 30# 2x12 In RW 4" box taps    11/13/22 NuStep L5 x 7 min HS curls 35lb 2x15 Leg Ext 10lb 2x15 S2S elevated mat 2x10 Standing march & abd 3lb cuff 2x10 in RW  11/08/22 NuStep L5 x50mins  HS curls 35lb 2x12 Leg Ext 10lb 2x12  STS x10,x5 from elevated mat table,  with 3s holds once her gets all the way up  Standing march 3lb cuff 2x10 in RW Side step at mat table  Leg press 30lb 3x12  11/06/22 NuStep L5 x54mins  HS curls 35lb 2x12 Leg Ext 10lb 2x12 STS 4x5 from elevated mat table, with 3s holds once her gets all the way up  Marching in bars Hip abd in bars Step ups 4" at stairs    11/01/22 Bike L4 x6 min HS curls 35lb 2x12 Leg Ext 10lb 2x12 Side step in // bars Tried gait in // frequent LOB Leg press 20lb 2x10 Alt 2in box taps w/ SPC 4in step up HHA x 1    10/30/22 Bike L 3 x 6 min Leg press 20lb 2x12 Standing shoulder Ext 5b 2x10  S2S elevated surface  with focus on anterior weight shifts, not allowing LE to push against table. X10 x5  HS curls 35lb 2x12 Leg Ext 10lb 2x12 Alt 4 in box taps  Attempted sanding march with 2lb cuff and SPC multiple LOB when lifting LLE   10/23/22 NuStep L 5 x 6 min HS curls 25lb 2x10  Leg ext 10lb 2x10 S2S elevated surface with UE use 2x8 weak Supine   Supine bridges x10  LE on Pball bridges, oblq, K2C  LUE PROM  AAROM 3lb WaTE shoulder flex x10   LUE flex x5, 1lb x5  LUE WE IR 1lb x5 each Stading shoulder abd  PATIENT EDUCATION:  Education details: POC/HEP Person educated: Patient Education method: Programmer, multimedia, Facilities manager, Verbal cues, and Handouts Education comprehension: verbalized understanding  HOME EXERCISE PROGRAM: Access Code: [email or web address] URL: [email or web address] Date: 10/18/2022 Prepared by: Stacie Glaze  Exercises - Standing Hip Abduction with Counter Support  - 1 x daily - 7 x weekly - 2 sets - 10 reps - 3 hold - Standing March with Counter Support  - 1 x daily - 7 x weekly - 2 sets - 10 reps -  3 hold - Heel Raises with Counter Support  - 1 x daily - 7 x weekly - 2 sets - 10 reps - 3 hold  ASSESSMENT:  CLINICAL IMPRESSION: Patient is a 82 y.o. male who was seen today for physical therapy  treatment for poor gait, repeated falls and the left shoulder  stiffness and weakness. Session consisted of LE strength and balance. Is ambulating with RW, appears to be more stable compared to [facility]. Still has some occasional toe catching on the R foot. Did well with sit to stands today, able to do without legs hitting table.   OBJECTIVE IMPAIRMENTS: Abnormal gait, cardiopulmonary status limiting activity, decreased activity tolerance, decreased balance, decreased endurance, decreased mobility, difficulty walking, decreased ROM, decreased strength, increased muscle spasms, impaired flexibility, and pain.   REHAB POTENTIAL: Good  CLINICAL DECISION MAKING: Stable/uncomplicated  EVALUATION COMPLEXITY: Low   GOALS: Goals reviewed with patient? Yes  SHORT TERM GOALS: Target date: 11/01/22 Independent with initial HEP Goal status: Met 11/01/22  LONG TERM GOALS: Target date: 01/18/23  Independent with advanced HEP Goal status: INITIAL  2.  Increase Berg balance test score to 40/56 Goal status: INITIAL  3.  Decrease TUG time to 19 seconds Goal status: INITIAL  4.  Increase left shoulder IR to 60 degrees Goal status: INITIAL  5.  Increase LE strength to 4/5 Goal status: INITIAL  PLAN:  PT FREQUENCY: 1-2x/week  PT DURATION: 12 weeks  PLANNED INTERVENTIONS: Therapeutic exercises, Therapeutic activity, Neuromuscular re-education, Balance training, Gait training, Patient/Family education, Self Care, Joint mobilization, Stair training, and Manual therapy  PLAN FOR NEXT SESSION: balance and LE strengthening, conitnue to look at and try to help left shoulder function   Cassie Freer, PT 11/20/2022, 11:33 AM

## 2022-11-22 ENCOUNTER — Ambulatory Visit: Payer: Medicare Other | Admitting: Physical Therapy

## 2022-11-22 ENCOUNTER — Encounter: Payer: Self-pay | Admitting: Physical Therapy

## 2022-11-22 DIAGNOSIS — R262 Difficulty in walking, not elsewhere classified: Secondary | ICD-10-CM | POA: Diagnosis not present

## 2022-11-22 DIAGNOSIS — R296 Repeated falls: Secondary | ICD-10-CM | POA: Diagnosis not present

## 2022-11-22 DIAGNOSIS — M6281 Muscle weakness (generalized): Secondary | ICD-10-CM

## 2022-11-22 DIAGNOSIS — R2689 Other abnormalities of gait and mobility: Secondary | ICD-10-CM | POA: Diagnosis not present

## 2022-11-22 NOTE — Therapy (Signed)
OUTPATIENT PHYSICAL THERAPY LOWER EXTREMITY TREATMENT   Patient Name: Harold Pittman MRN: [id number] DOB:[birth year], 82 y.o., male Today's Date: 11/22/2022  END OF SESSION:  PT End of Session - 11/22/22 1140     Visit Number 9    Date for PT Re-Evaluation 01/18/23    PT Start Time 1140    PT Stop Time 1225    PT Time Calculation (min) 45 min    Activity Tolerance Patient tolerated treatment well    Behavior During Therapy WFL for tasks assessed/performed               Past Medical History:  Diagnosis Date   Anemia    low iron   Anxiety state, unspecified    Blood in stool    Chronic airway obstruction, not elsewhere classified    Coronary artery disease    Coronary atherosclerosis of artery bypass graft    Dyspnea    ED (erectile dysfunction)    Esophageal reflux    Gout, unspecified    Osteoarthrosis, unspecified whether generalized or localized, unspecified site    Other and unspecified hyperlipidemia    Other specified disorder of rectum and anus    Personal history of tobacco use, presenting hazards to health    Sleep apnea    does not use cpap   Type II or unspecified type diabetes mellitus without mention of complication, not stated as uncontrolled    Unspecified essential hypertension    Past Surgical History:  Procedure Laterality Date   BIOPSY  09/26/2017   Procedure: BIOPSY;  Surgeon: Meryl Dare, MD;  Location: [facility] ENDOSCOPY;  Service: Endoscopy;;   BIOPSY  02/03/2018   Procedure: BIOPSY;  Surgeon: Lemar Lofty., MD;  Location: [facility] ENDOSCOPY;  Service: Gastroenterology;;   COLONOSCOPY WITH PROPOFOL N/A 09/26/2017   Procedure: COLONOSCOPY WITH PROPOFOL;  Surgeon: Meryl Dare, MD;  Location: [facility] ENDOSCOPY;  Service: Endoscopy;  Laterality: N/A;   CORONARY ARTERY BYPASS GRAFT  97   LIMA to LAD, sequential saphenous vein graft to the first and second diagonal, sequential saphenous vein graft to the intermediate OM and circumflex and SVG  to RCA   ESOPHAGOGASTRODUODENOSCOPY (EGD) WITH PROPOFOL N/A 09/26/2017   Procedure: ESOPHAGOGASTRODUODENOSCOPY (EGD) WITH PROPOFOL;  Surgeon: Meryl Dare, MD;  Location: [facility] ENDOSCOPY;  Service: Endoscopy;  Laterality: N/A;   ESOPHAGOGASTRODUODENOSCOPY (EGD) WITH PROPOFOL N/A 02/03/2018   Procedure: ESOPHAGOGASTRODUODENOSCOPY (EGD);  Surgeon: Lemar Lofty., MD;  Location: [facility] ENDOSCOPY;  Service: Gastroenterology;  Laterality: N/A;   KNEE SURGERY     BILATERAL   POLYPECTOMY  09/26/2017   Procedure: POLYPECTOMY;  Surgeon: Meryl Dare, MD;  Location: [facility] ENDOSCOPY;  Service: Endoscopy;;   ROTATOR CUFF REPAIR     SUBMUCOSAL INJECTION  02/03/2018   Procedure: SUBMUCOSAL INJECTION;  Surgeon: Lemar Lofty., MD;  Location: [facility] ENDOSCOPY;  Service: Gastroenterology;;   Patient Active Problem List   Diagnosis Date Noted   Acute gouty arthritis 03/28/2022   Gout flare 01/28/2022   Peripheral neuropathy 01/15/2022   Ataxia 01/15/2022   Type 2 diabetes mellitus (A1c 6.0 on 05/03/2021) with steroid-induced hyperglycemia 05/16/2021   CKD (chronic kidney disease) stage 4, GFR 15-29 ml/min (HCC) 05/15/2021   Olecranon bursitis, right elbow 05/15/2021   Tremor 05/15/2021   Elbow pain, left 12/05/2020   Constipation 12/05/2020   Atherosclerosis of aorta (HCC) 12/05/2020   Chest pain, atypical 12/05/2020   CTS (carpal tunnel syndrome) 03/24/2020   Grief at loss of child 03/24/2020  Cholelithiasis 09/16/2019   Nausea 09/02/2019   Weight loss 09/02/2019   Elevated troponin 01/12/2019   Hypertensive urgency 01/10/2019   Well adult exam 10/20/2018   Balanitis 10/20/2018   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/27/2017   Adenoma of stomach 12/25/2017   Abnormal findings on esophagogastroduodenoscopy (EGD) 12/25/2017   Sebaceous cyst 10/04/2017   Iron deficiency anemia    Benign neoplasm of sigmoid colon    Benign neoplasm of cecum    Benign neoplasm of  transverse colon    Positive occult stool blood test 09/24/2017   (HFpEF) heart failure with preserved ejection fraction (HCC) 09/24/2017   Hypokalemia 08/17/2015   Generalized weakness 07/22/2015   Gait disorder 03/18/2014   Low back pain 01/12/2014   History of colon polyps 12/17/2013   DM (diabetes mellitus), type 2 with peripheral vascular complications (HCC) 09/16/2013   Diarrhea 10/08/2012   Edema 10/08/2011   DOE (dyspnea on exertion) 06/29/2011   LATERAL EPICONDYLITIS, LEFT 02/20/2010   TOBACCO USE, QUIT 02/23/2009   Cough 02/16/2009   Hearing loss 10/15/2008   ERECTILE DYSFUNCTION 04/30/2008   Coronary artery disease involving native coronary artery of native heart without angina pectoris 04/30/2008   COPD (chronic obstructive pulmonary disease) (HCC) 05/21/2007   Anxiety state 05/20/2007   Insomnia 03/04/2007   Hyperlipidemia LDL goal <70 02/01/2007   Gout attack 02/01/2007   Essential hypertension 02/01/2007   GERD 02/01/2007   Osteoarthritis 02/01/2007    PCP: Posey Rea, MD  REFERRING PROVIDER: Plotnikov, MD  REFERRING DIAG: Ataxic gait  THERAPY DIAG:  Muscle weakness (generalized)  Difficulty in walking, not elsewhere classified  Repeated falls  Rationale for Evaluation and Treatment: Rehabilitation  ONSET DATE: 10/15/22  SUBJECTIVE:   SUBJECTIVE STATEMENT: "I feel good"  PERTINENT HISTORY: See above PAIN:  Are you having pain? Yes: NPRS scale: 0/10 Pain location: left shoulder upper arm Pain description: sharp Aggravating factors: reaching back to get wallet 6/10 Relieving factors: rest not moving no pain  PRECAUTIONS: Fall  RED FLAGS: None   WEIGHT BEARING RESTRICTIONS: No  FALLS:  Has patient fallen in last 6 months? Yes. Number of falls 2  LIVING ENVIRONMENT: Lives with: lives alone Lives in: House/apartment Stairs: No Has following equipment at home: Single point cane, Environmental consultant - 4 wheeled, Marine scientist  OCCUPATION: retired  Psychologist, occupational  PLOF: Independent  PATIENT GOALS: walk better, have better balance, reach wallet  NEXT MD VISIT: none  OBJECTIVE:   DIAGNOSTIC FINDINGS: none  COGNITION: Overall cognitive status: Within functional limits for tasks assessed     SENSATION: WFL  POSTURE: rounded shoulders and forward head  PALPATION: Non tender  LOWER EXTREMITY ROM: WFL's except ankle DF was unable to get to neutral and had very poor control of lateral motions  UPPER EXTREMITY ROM:  left shoulder flexion 140, abduction 130, ER 60, IR 20 degrees with IR painful  SHOULDER MMT:  flexion/abduction 3/5, ER 3+/5, IR 3+/5 pain   LOWER EXTREMITY MMT:  MMT Right eval Left eval  Hip flexion 4- 4-  Hip extension 4- 4-  Hip abduction 3+ 3+  Hip adduction    Hip internal rotation    Hip external rotation    Knee flexion 4- 4-  Knee extension 4- 4-  Ankle dorsiflexion 1 1  Ankle plantarflexion 2 2  Ankle inversion 3+ 3+  Ankle eversion 3+ 3+   (Blank rows = not tested) FUNCTIONAL TESTS:  5 times sit to stand: 50 seconds had to use hands after  multiple attmepts Timed up and go (TUG): 32 seconds with holding wall Berg Balance Scale: 27/56  GAIT: Distance walked: 100 feet Assistive device utilized: Single point cane Level of assistance: SBA Comments: slow, tends to use walls and furniture   TODAY'S TREATMENT:                                                                                                                              DATE 815/24 NuStep L5x  HS curls 35# 2x15 Leg ext 10 2x15 Leg press 30# 2x12, heel raises 30lb 2x15 STS 2x10 Side step at mat table  Alt 4 in box taps CGA to SBA 2x5  11/20/22 NuStep L5x  3# marching 20 reps alt x2 3# hip abd 2x10 STS 2x10 Leg ext 10 2x15 HS curls 35# 2x15 Leg press 30# 2x12 In RW 4" box taps    11/13/22 NuStep L5 x 7 min HS curls 35lb 2x15 Leg Ext 10lb 2x15 S2S elevated mat 2x10 Standing march & abd 3lb cuff 2x10 in  RW  11/08/22 NuStep L5 x25mins  HS curls 35lb 2x12 Leg Ext 10lb 2x12  STS x10,x5 from elevated mat table, with 3s holds once her gets all the way up  Standing march 3lb cuff 2x10 in RW Side step at mat table  Leg press 30lb 3x12  11/06/22 NuStep L5 x37mins  HS curls 35lb 2x12 Leg Ext 10lb 2x12 STS 4x5 from elevated mat table, with 3s holds once her gets all the way up  Marching in bars Hip abd in bars Step ups 4" at stairs    11/01/22 Bike L4 x6 min HS curls 35lb 2x12 Leg Ext 10lb 2x12 Side step in // bars Tried gait in // frequent LOB Leg press 20lb 2x10 Alt 2in box taps w/ SPC 4in step up HHA x 1    10/30/22 Bike L 3 x 6 min Leg press 20lb 2x12 Standing shoulder Ext 5b 2x10  S2S elevated surface  with focus on anterior weight shifts, not allowing LE to push against table. X10 x5  HS curls 35lb 2x12 Leg Ext 10lb 2x12 Alt 4 in box taps  Attempted sanding march with 2lb cuff and SPC multiple LOB when lifting LLE   10/23/22 NuStep L 5 x 6 min HS curls 25lb 2x10  Leg ext 10lb 2x10 S2S elevated surface with UE use 2x8 weak Supine   Supine bridges x10  LE on Pball bridges, oblq, K2C  LUE PROM  AAROM 3lb WaTE shoulder flex x10   LUE flex x5, 1lb x5  LUE WE IR 1lb x5 each Stading shoulder abd  PATIENT EDUCATION:  Education details: POC/HEP Person educated: Patient Education method: Programmer, multimedia, Facilities manager, Verbal cues, and Handouts Education comprehension: verbalized understanding  HOME EXERCISE PROGRAM: Access Code: [email or web address] URL: [email or web address] Date: 10/18/2022 Prepared by: Stacie Glaze  Exercises - Standing Hip Abduction with Counter Support  - 1 x daily - 7 x weekly - 2 sets -  10 reps - 3 hold - Standing March with Counter Support  - 1 x daily - 7 x weekly - 2 sets - 10 reps - 3 hold - Heel Raises with Counter Support  - 1 x daily - 7 x weekly - 2 sets - 10 reps - 3 hold  ASSESSMENT:  CLINICAL IMPRESSION: Patient is a 82  y.o. male who was seen today for physical therapy  treatment for poor gait, repeated falls and the left shoulder stiffness and weakness. Session consisted of LE strength and balance. Is ambulating with RW, appears to be more stable compared to [facility]. Still has some occasional toe catching on the R foot. Did well with sit to stands today, able to do without legs hitting table. Added alt box taps without AD requiring CGA to min guard to regain balance.  OBJECTIVE IMPAIRMENTS: Abnormal gait, cardiopulmonary status limiting activity, decreased activity tolerance, decreased balance, decreased endurance, decreased mobility, difficulty walking, decreased ROM, decreased strength, increased muscle spasms, impaired flexibility, and pain.   REHAB POTENTIAL: Good  CLINICAL DECISION MAKING: Stable/uncomplicated  EVALUATION COMPLEXITY: Low   GOALS: Goals reviewed with patient? Yes  SHORT TERM GOALS: Target date: 11/01/22 Independent with initial HEP Goal status: Met 11/01/22  LONG TERM GOALS: Target date: 01/18/23  Independent with advanced HEP Goal status: INITIAL  2.  Increase Berg balance test score to 40/56 Goal status: INITIAL  3.  Decrease TUG time to 19 seconds Goal status: INITIAL  4.  Increase left shoulder IR to 60 degrees Goal status: INITIAL  5.  Increase LE strength to 4/5 Goal status: INITIAL  PLAN:  PT FREQUENCY: 1-2x/week  PT DURATION: 12 weeks  PLANNED INTERVENTIONS: Therapeutic exercises, Therapeutic activity, Neuromuscular re-education, Balance training, Gait training, Patient/Family education, Self Care, Joint mobilization, Stair training, and Manual therapy  PLAN FOR NEXT SESSION: balance and LE strengthening, conitnue to look at and try to help left shoulder function   Grayce Sessions, PTA 11/22/2022, 11:40 AM

## 2022-11-23 ENCOUNTER — Other Ambulatory Visit: Payer: Self-pay

## 2022-11-23 DIAGNOSIS — D472 Monoclonal gammopathy: Secondary | ICD-10-CM

## 2022-11-26 ENCOUNTER — Other Ambulatory Visit: Payer: Medicare Other

## 2022-11-27 ENCOUNTER — Ambulatory Visit: Payer: Medicare Other | Admitting: Physical Therapy

## 2022-11-27 ENCOUNTER — Encounter: Payer: Self-pay | Admitting: Physical Therapy

## 2022-11-27 DIAGNOSIS — R2689 Other abnormalities of gait and mobility: Secondary | ICD-10-CM | POA: Diagnosis not present

## 2022-11-27 DIAGNOSIS — R296 Repeated falls: Secondary | ICD-10-CM | POA: Diagnosis not present

## 2022-11-27 DIAGNOSIS — R262 Difficulty in walking, not elsewhere classified: Secondary | ICD-10-CM

## 2022-11-27 DIAGNOSIS — M6281 Muscle weakness (generalized): Secondary | ICD-10-CM | POA: Diagnosis not present

## 2022-11-27 NOTE — Therapy (Addendum)
OUTPATIENT PHYSICAL THERAPY LOWER EXTREMITY TREATMENT  Progress Note Reporting Period 10/18/22 to 11/27/22  See note below for Objective Data and Assessment of Progress/Goals.     Patient Name: Harold Pittman MRN: [id number] DOB:[birth year], 82 y.o., male Today's Date: 11/27/2022  END OF SESSION:  PT End of Session - 11/27/22 1145     Visit Number 10    Date for PT Re-Evaluation 01/18/23    PT Start Time 1145    PT Stop Time 1230    PT Time Calculation (min) 45 min    Activity Tolerance Patient tolerated treatment well    Behavior During Therapy WFL for tasks assessed/performed               Past Medical History:  Diagnosis Date   Anemia    low iron   Anxiety state, unspecified    Blood in stool    Chronic airway obstruction, not elsewhere classified    Coronary artery disease    Coronary atherosclerosis of artery bypass graft    Dyspnea    ED (erectile dysfunction)    Esophageal reflux    Gout, unspecified    Osteoarthrosis, unspecified whether generalized or localized, unspecified site    Other and unspecified hyperlipidemia    Other specified disorder of rectum and anus    Personal history of tobacco use, presenting hazards to health    Sleep apnea    does not use cpap   Type II or unspecified type diabetes mellitus without mention of complication, not stated as uncontrolled    Unspecified essential hypertension    Past Surgical History:  Procedure Laterality Date   BIOPSY  09/26/2017   Procedure: BIOPSY;  Surgeon: Meryl Dare, MD;  Location: [facility] ENDOSCOPY;  Service: Endoscopy;;   BIOPSY  02/03/2018   Procedure: BIOPSY;  Surgeon: Lemar Lofty., MD;  Location: [facility] ENDOSCOPY;  Service: Gastroenterology;;   COLONOSCOPY WITH PROPOFOL N/A 09/26/2017   Procedure: COLONOSCOPY WITH PROPOFOL;  Surgeon: Meryl Dare, MD;  Location: [facility] ENDOSCOPY;  Service: Endoscopy;  Laterality: N/A;   CORONARY ARTERY BYPASS GRAFT  97   LIMA to LAD, sequential  saphenous vein graft to the first and second diagonal, sequential saphenous vein graft to the intermediate OM and circumflex and SVG to RCA   ESOPHAGOGASTRODUODENOSCOPY (EGD) WITH PROPOFOL N/A 09/26/2017   Procedure: ESOPHAGOGASTRODUODENOSCOPY (EGD) WITH PROPOFOL;  Surgeon: Meryl Dare, MD;  Location: [facility] ENDOSCOPY;  Service: Endoscopy;  Laterality: N/A;   ESOPHAGOGASTRODUODENOSCOPY (EGD) WITH PROPOFOL N/A 02/03/2018   Procedure: ESOPHAGOGASTRODUODENOSCOPY (EGD);  Surgeon: Lemar Lofty., MD;  Location: [facility] ENDOSCOPY;  Service: Gastroenterology;  Laterality: N/A;   KNEE SURGERY     BILATERAL   POLYPECTOMY  09/26/2017   Procedure: POLYPECTOMY;  Surgeon: Meryl Dare, MD;  Location: [facility] ENDOSCOPY;  Service: Endoscopy;;   ROTATOR CUFF REPAIR     SUBMUCOSAL INJECTION  02/03/2018   Procedure: SUBMUCOSAL INJECTION;  Surgeon: Lemar Lofty., MD;  Location: [facility] ENDOSCOPY;  Service: Gastroenterology;;   Patient Active Problem List   Diagnosis Date Noted   Acute gouty arthritis 03/28/2022   Gout flare 01/28/2022   Peripheral neuropathy 01/15/2022   Ataxia 01/15/2022   Type 2 diabetes mellitus (A1c 6.0 on 05/03/2021) with steroid-induced hyperglycemia 05/16/2021   CKD (chronic kidney disease) stage 4, GFR 15-29 ml/min (HCC) 05/15/2021   Olecranon bursitis, right elbow 05/15/2021   Tremor 05/15/2021   Elbow pain, left 12/05/2020   Constipation 12/05/2020   Atherosclerosis of aorta (HCC) 12/05/2020  Chest pain, atypical 12/05/2020   CTS (carpal tunnel syndrome) 03/24/2020   Grief at loss of child 03/24/2020   Cholelithiasis 09/16/2019   Nausea 09/02/2019   Weight loss 09/02/2019   Elevated troponin 01/12/2019   Hypertensive urgency 01/10/2019   Well adult exam 10/20/2018   Balanitis 10/20/2018   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/27/2017   Adenoma of stomach 12/25/2017   Abnormal findings on esophagogastroduodenoscopy (EGD) 12/25/2017   Sebaceous cyst  10/04/2017   Iron deficiency anemia    Benign neoplasm of sigmoid colon    Benign neoplasm of cecum    Benign neoplasm of transverse colon    Positive occult stool blood test 09/24/2017   (HFpEF) heart failure with preserved ejection fraction (HCC) 09/24/2017   Hypokalemia 08/17/2015   Generalized weakness 07/22/2015   Gait disorder 03/18/2014   Low back pain 01/12/2014   History of colon polyps 12/17/2013   DM (diabetes mellitus), type 2 with peripheral vascular complications (HCC) 09/16/2013   Diarrhea 10/08/2012   Edema 10/08/2011   DOE (dyspnea on exertion) 06/29/2011   LATERAL EPICONDYLITIS, LEFT 02/20/2010   TOBACCO USE, QUIT 02/23/2009   Cough 02/16/2009   Hearing loss 10/15/2008   ERECTILE DYSFUNCTION 04/30/2008   Coronary artery disease involving native coronary artery of native heart without angina pectoris 04/30/2008   COPD (chronic obstructive pulmonary disease) (HCC) 05/21/2007   Anxiety state 05/20/2007   Insomnia 03/04/2007   Hyperlipidemia LDL goal <70 02/01/2007   Gout attack 02/01/2007   Essential hypertension 02/01/2007   GERD 02/01/2007   Osteoarthritis 02/01/2007    PCP: Posey Rea, MD  REFERRING PROVIDER: Plotnikov, MD  REFERRING DIAG: Ataxic gait  THERAPY DIAG:  Muscle weakness (generalized)  Difficulty in walking, not elsewhere classified  Repeated falls  Rationale for Evaluation and Treatment: Rehabilitation  ONSET DATE: 10/15/22  SUBJECTIVE:   SUBJECTIVE STATEMENT: "I feel good""  PERTINENT HISTORY: See above PAIN:  Are you having pain? Yes: NPRS scale: 0/10 Pain location: left shoulder upper arm Pain description: sharp Aggravating factors: reaching back to get wallet 6/10 Relieving factors: rest not moving no pain  PRECAUTIONS: Fall  RED FLAGS: None   WEIGHT BEARING RESTRICTIONS: No  FALLS:  Has patient fallen in last 6 months? Yes. Number of falls 2  LIVING ENVIRONMENT: Lives with: lives alone Lives in:  House/apartment Stairs: No Has following equipment at home: Single point cane, Environmental consultant - 4 wheeled, Marine scientist  OCCUPATION: retired Psychologist, occupational  PLOF: Independent  PATIENT GOALS: walk better, have better balance, reach wallet  NEXT MD VISIT: none  OBJECTIVE:   DIAGNOSTIC FINDINGS: none  COGNITION: Overall cognitive status: Within functional limits for tasks assessed     SENSATION: WFL  POSTURE: rounded shoulders and forward head  PALPATION: Non tender  LOWER EXTREMITY ROM: WFL's except ankle DF was unable to get to neutral and had very poor control of lateral motions  UPPER EXTREMITY ROM:  left shoulder flexion 140, abduction 130, ER 60, IR 20 degrees with IR painful  SHOULDER MMT:  flexion/abduction 3/5, ER 3+/5, IR 3+/5 pain   LOWER EXTREMITY MMT:  MMT Right eval Left eval Right 11/27/22 Left 11/27/22  Hip flexion 4- 4- 4 4+  Hip extension 4- 4- 4 4  Hip abduction 3+ 3+ 4+ 4+  Hip adduction      Hip internal rotation      Hip external rotation      Knee flexion 4- 4- 4 4  Knee extension 4- 4- 4+ 4+  Ankle dorsiflexion 1 1    Ankle plantarflexion 2 2    Ankle inversion 3+ 3+    Ankle eversion 3+ 3+     (Blank rows = not tested) FUNCTIONAL TESTS:  5 times sit to stand: 50 seconds had to use hands after multiple attmepts Timed up and go (TUG): 32 seconds with holding wall Berg Balance Scale: 27/56  GAIT: Distance walked: 100 feet Assistive device utilized: Single point cane Level of assistance: SBA Comments: slow, tends to use walls and furniture   TODAY'S TREATMENT:                                                                                                                              DATE 11/27/22 NuStep L5 x 6 min Checked Goals Leg press 40lb 2x12 4in step ups x5 each 1 rail  815/24 NuStep L5x  HS curls 35# 2x15 Leg ext 10 2x15 Leg press 30# 2x12, heel raises 30lb 2x15 STS 2x10 Side step at mat table  Alt 4 in box taps CGA to  SBA 2x5  11/20/22 NuStep L5x  3# marching 20 reps alt x2 3# hip abd 2x10 STS 2x10 Leg ext 10 2x15 HS curls 35# 2x15 Leg press 30# 2x12 In RW 4" box taps    11/13/22 NuStep L5 x 7 min HS curls 35lb 2x15 Leg Ext 10lb 2x15 S2S elevated mat 2x10 Standing march & abd 3lb cuff 2x10 in RW  11/08/22 NuStep L5 x2mins  HS curls 35lb 2x12 Leg Ext 10lb 2x12  STS x10,x5 from elevated mat table, with 3s holds once her gets all the way up  Standing march 3lb cuff 2x10 in RW Side step at mat table  Leg press 30lb 3x12  11/06/22 NuStep L5 x55mins  HS curls 35lb 2x12 Leg Ext 10lb 2x12 STS 4x5 from elevated mat table, with 3s holds once her gets all the way up  Marching in bars Hip abd in bars Step ups 4" at stairs    PATIENT EDUCATION:  Education details: POC/HEP Person educated: Patient Education method: Programmer, multimedia, Facilities manager, Verbal cues, and Handouts Education comprehension: verbalized understanding  HOME EXERCISE PROGRAM: Access Code: [email or web address] URL: [email or web address] Date: 10/18/2022 Prepared by: Stacie Glaze  Exercises - Standing Hip Abduction with Counter Support  - 1 x daily - 7 x weekly - 2 sets - 10 reps - 3 hold - Standing March with Counter Support  - 1 x daily - 7 x weekly - 2 sets - 10 reps - 3 hold - Heel Raises with Counter Support  - 1 x daily - 7 x weekly - 2 sets - 10 reps - 3 hold  ASSESSMENT:  CLINICAL IMPRESSION: Patient is a 82 y.o. male who was seen today for physical therapy  treatment for poor gait, repeated falls and the left shoulder stiffness and weakness. He has progressed towards goals increasing his LE strength as well as slightly increasing her BERG score. Pt has  very little ankle strategies to aid with balance. PF/DF are very weak. SBA needed with step ups     OBJECTIVE IMPAIRMENTS: Abnormal gait, cardiopulmonary status limiting activity, decreased activity tolerance, decreased balance, decreased endurance,  decreased mobility, difficulty walking, decreased ROM, decreased strength, increased muscle spasms, impaired flexibility, and pain.   REHAB POTENTIAL: Good  CLINICAL DECISION MAKING: Stable/uncomplicated  EVALUATION COMPLEXITY: Low   GOALS: Goals reviewed with patient? Yes  SHORT TERM GOALS: Target date: 11/01/22 Independent with initial HEP Goal status: Met 11/01/22  LONG TERM GOALS: Target date: 01/18/23  Independent with advanced HEP Goal status: INITIAL  2.  Increase Berg balance test score to 40/56 Goal status: 29 Progression  11/27/22  3.  Decrease TUG time to 19 seconds Goal status: 14.07 Met 11/27/22 w/ RW  4.  Increase left shoulder IR to 60 degrees Goal status: 25 deg ongoing 11/27/22  5.  Increase LE strength to 4/5 Goal status: Met 11/27/42  PLAN:  PT FREQUENCY: 1-2x/week  PT DURATION: 12 weeks  PLANNED INTERVENTIONS: Therapeutic exercises, Therapeutic activity, Neuromuscular re-education, Balance training, Gait training, Patient/Family education, Self Care, Joint mobilization, Stair training, and Manual therapy  PLAN FOR NEXT SESSION: balance and LE strengthening, conitnue to look at and try to help left shoulder function   Grayce Sessions, PTA 11/27/2022, 11:45 AM

## 2022-11-29 ENCOUNTER — Encounter: Payer: Self-pay | Admitting: Physical Therapy

## 2022-11-29 ENCOUNTER — Telehealth: Payer: Self-pay | Admitting: Internal Medicine

## 2022-11-29 ENCOUNTER — Ambulatory Visit: Payer: Medicare Other | Admitting: Physical Therapy

## 2022-11-29 DIAGNOSIS — R296 Repeated falls: Secondary | ICD-10-CM

## 2022-11-29 DIAGNOSIS — M6281 Muscle weakness (generalized): Secondary | ICD-10-CM | POA: Diagnosis not present

## 2022-11-29 DIAGNOSIS — R262 Difficulty in walking, not elsewhere classified: Secondary | ICD-10-CM | POA: Diagnosis not present

## 2022-11-29 DIAGNOSIS — R2689 Other abnormalities of gait and mobility: Secondary | ICD-10-CM | POA: Diagnosis not present

## 2022-11-29 MED ORDER — AMLODIPINE BESYLATE 5 MG PO TABS: 5.0000 mg | ORAL_TABLET | Freq: Every day | ORAL | 2 refills | Status: DC

## 2022-11-29 NOTE — Telephone Encounter (Signed)
Sent refill to [facility] 

## 2022-11-29 NOTE — Therapy (Signed)
OUTPATIENT PHYSICAL THERAPY LOWER EXTREMITY TREATMENT    Patient Name: Harold Pittman MRN: [id number] DOB:30-Mar-1941, 82 y.o., male Today's Date: 11/29/2022  END OF SESSION:  PT End of Session - 11/29/22 1143     Visit Number 11    Date for PT Re-Evaluation 01/18/23    Authorization Type [employer] [place]    PT Start Time 1145    PT Stop Time 1230    PT Time Calculation (min) 45 min    Activity Tolerance Patient tolerated treatment well    Behavior During Therapy WFL for tasks assessed/performed               Past Medical History:  Diagnosis Date   Anemia    low iron   Anxiety state, unspecified    Blood in stool    Chronic airway obstruction, not elsewhere classified    Coronary artery disease    Coronary atherosclerosis of artery bypass graft    Dyspnea    ED (erectile dysfunction)    Esophageal reflux    Gout, unspecified    Osteoarthrosis, unspecified whether generalized or localized, unspecified site    Other and unspecified hyperlipidemia    Other specified disorder of rectum and anus    Personal history of tobacco use, presenting hazards to health    Sleep apnea    does not use cpap   Type II or unspecified type diabetes mellitus without mention of complication, not stated as uncontrolled    Unspecified essential hypertension    Past Surgical History:  Procedure Laterality Date   BIOPSY  09/26/2017   Procedure: BIOPSY;  Surgeon: Meryl Dare, MD;  Location: [facility] ENDOSCOPY;  Service: Endoscopy;;   BIOPSY  02/03/2018   Procedure: BIOPSY;  Surgeon: Lemar Lofty., MD;  Location: [facility] ENDOSCOPY;  Service: Gastroenterology;;   COLONOSCOPY WITH PROPOFOL N/A 09/26/2017   Procedure: COLONOSCOPY WITH PROPOFOL;  Surgeon: Meryl Dare, MD;  Location: [facility] ENDOSCOPY;  Service: Endoscopy;  Laterality: N/A;   CORONARY ARTERY BYPASS GRAFT  97   LIMA to LAD, sequential saphenous vein graft to the first and second diagonal, sequential saphenous vein graft to the  intermediate OM and circumflex and SVG to RCA   ESOPHAGOGASTRODUODENOSCOPY (EGD) WITH PROPOFOL N/A 09/26/2017   Procedure: ESOPHAGOGASTRODUODENOSCOPY (EGD) WITH PROPOFOL;  Surgeon: Meryl Dare, MD;  Location: [facility] ENDOSCOPY;  Service: Endoscopy;  Laterality: N/A;   ESOPHAGOGASTRODUODENOSCOPY (EGD) WITH PROPOFOL N/A 02/03/2018   Procedure: ESOPHAGOGASTRODUODENOSCOPY (EGD);  Surgeon: Lemar Lofty., MD;  Location: [facility] ENDOSCOPY;  Service: Gastroenterology;  Laterality: N/A;   KNEE SURGERY     BILATERAL   POLYPECTOMY  09/26/2017   Procedure: POLYPECTOMY;  Surgeon: Meryl Dare, MD;  Location: [facility] ENDOSCOPY;  Service: Endoscopy;;   ROTATOR CUFF REPAIR     SUBMUCOSAL INJECTION  02/03/2018   Procedure: SUBMUCOSAL INJECTION;  Surgeon: Lemar Lofty., MD;  Location: [facility] ENDOSCOPY;  Service: Gastroenterology;;   Patient Active Problem List   Diagnosis Date Noted   Acute gouty arthritis 03/28/2022   Gout flare 01/28/2022   Peripheral neuropathy 01/15/2022   Ataxia 01/15/2022   Type 2 diabetes mellitus (A1c 6.0 on 05/03/2021) with steroid-induced hyperglycemia 05/16/2021   CKD (chronic kidney disease) stage 4, GFR 15-29 ml/min (HCC) 05/15/2021   Olecranon bursitis, right elbow 05/15/2021   Tremor 05/15/2021   Elbow pain, left 12/05/2020   Constipation 12/05/2020   Atherosclerosis of aorta (HCC) 12/05/2020   Chest pain, atypical 12/05/2020   CTS (carpal tunnel syndrome) 03/24/2020  Grief at loss of child 03/24/2020   Cholelithiasis 09/16/2019   Nausea 09/02/2019   Weight loss 09/02/2019   Elevated troponin 01/12/2019   Hypertensive urgency 01/10/2019   Well adult exam 10/20/2018   Balanitis 10/20/2018   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/27/2017   Adenoma of stomach 12/25/2017   Abnormal findings on esophagogastroduodenoscopy (EGD) 12/25/2017   Sebaceous cyst 10/04/2017   Iron deficiency anemia    Benign neoplasm of sigmoid colon    Benign neoplasm of  cecum    Benign neoplasm of transverse colon    Positive occult stool blood test 09/24/2017   (HFpEF) heart failure with preserved ejection fraction (HCC) 09/24/2017   Hypokalemia 08/17/2015   Generalized weakness 07/22/2015   Gait disorder 03/18/2014   Low back pain 01/12/2014   History of colon polyps 12/17/2013   DM (diabetes mellitus), type 2 with peripheral vascular complications (HCC) 09/16/2013   Diarrhea 10/08/2012   Edema 10/08/2011   DOE (dyspnea on exertion) 06/29/2011   LATERAL EPICONDYLITIS, LEFT 02/20/2010   TOBACCO USE, QUIT 02/23/2009   Cough 02/16/2009   Hearing loss 10/15/2008   ERECTILE DYSFUNCTION 04/30/2008   Coronary artery disease involving native coronary artery of native heart without angina pectoris 04/30/2008   COPD (chronic obstructive pulmonary disease) (HCC) 05/21/2007   Anxiety state 05/20/2007   Insomnia 03/04/2007   Hyperlipidemia LDL goal <70 02/01/2007   Gout attack 02/01/2007   Essential hypertension 02/01/2007   GERD 02/01/2007   Osteoarthritis 02/01/2007    PCP: Posey Rea, MD  REFERRING PROVIDER: Plotnikov, MD  REFERRING DIAG: Ataxic gait  THERAPY DIAG:  Muscle weakness (generalized)  Difficulty in walking, not elsewhere classified  Repeated falls  Rationale for Evaluation and Treatment: Rehabilitation  ONSET DATE: 10/15/22  SUBJECTIVE:   SUBJECTIVE STATEMENT: "I feel good""  PERTINENT HISTORY: See above PAIN:  Are you having pain? Yes: NPRS scale: 0/10 Pain location: left shoulder upper arm Pain description: sharp Aggravating factors: reaching back to get wallet 6/10 Relieving factors: rest not moving no pain  PRECAUTIONS: Fall  RED FLAGS: None   WEIGHT BEARING RESTRICTIONS: No  FALLS:  Has patient fallen in last 6 months? Yes. Number of falls 2  LIVING ENVIRONMENT: Lives with: lives alone Lives in: House/apartment Stairs: No Has following equipment at home: Single point cane, Environmental consultant - 4 wheeled, Buyer, retail  OCCUPATION: retired Psychologist, occupational  PLOF: Independent  PATIENT GOALS: walk better, have better balance, reach wallet  NEXT MD VISIT: none  OBJECTIVE:   DIAGNOSTIC FINDINGS: none  COGNITION: Overall cognitive status: Within functional limits for tasks assessed     SENSATION: WFL  POSTURE: rounded shoulders and forward head  PALPATION: Non tender  LOWER EXTREMITY ROM: WFL's except ankle DF was unable to get to neutral and had very poor control of lateral motions  UPPER EXTREMITY ROM:  left shoulder flexion 140, abduction 130, ER 60, IR 20 degrees with IR painful  SHOULDER MMT:  flexion/abduction 3/5, ER 3+/5, IR 3+/5 pain   LOWER EXTREMITY MMT:  MMT Right eval Left eval Right 11/27/22 Left 11/27/22  Hip flexion 4- 4- 4 4+  Hip extension 4- 4- 4 4  Hip abduction 3+ 3+ 4+ 4+  Hip adduction      Hip internal rotation      Hip external rotation      Knee flexion 4- 4- 4 4  Knee extension 4- 4- 4+ 4+  Ankle dorsiflexion 1 1    Ankle plantarflexion 2 2  Ankle inversion 3+ 3+    Ankle eversion 3+ 3+     (Blank rows = not tested) FUNCTIONAL TESTS:  5 times sit to stand: 50 seconds had to use hands after multiple attmepts Timed up and go (TUG): 32 seconds with holding wall Berg Balance Scale: 27/56  GAIT: Distance walked: 100 feet Assistive device utilized: Single point cane Level of assistance: SBA Comments: slow, tends to use walls and furniture   TODAY'S TREATMENT:                                                                                                                              DATE 11/29/22 NuStep L5 x 6 min HS curls 45lb 2x12 Leg Ext 15lb 2x10 Standing reaching outside BOS, then standing marches 2x10 CGA  S2S 2x10 Side step at mat table  Alt 4in box taps 2x5 CGA  Leg press 40lb 3x12  11/27/22 NuStep L5 x 6 min Checked Goals Leg press 40lb 2x12 4in step ups x5 each 1 rail  815/24 NuStep L5x  HS curls 35# 2x15 Leg ext 10  2x15 Leg press 30# 2x12, heel raises 30lb 2x15 STS 2x10 Side step at mat table  Alt 4 in box taps CGA to SBA 2x5  11/20/22 NuStep L5x  3# marching 20 reps alt x2 3# hip abd 2x10 STS 2x10 Leg ext 10 2x15 HS curls 35# 2x15 Leg press 30# 2x12 In RW 4" box taps    11/13/22 NuStep L5 x 7 min HS curls 35lb 2x15 Leg Ext 10lb 2x15 S2S elevated mat 2x10 Standing march & abd 3lb cuff 2x10 in RW  11/08/22 NuStep L5 x68mins  HS curls 35lb 2x12 Leg Ext 10lb 2x12  STS x10,x5 from elevated mat table, with 3s holds once her gets all the way up  Standing march 3lb cuff 2x10 in RW Side step at mat table  Leg press 30lb 3x12  11/06/22 NuStep L5 x31mins  HS curls 35lb 2x12 Leg Ext 10lb 2x12 STS 4x5 from elevated mat table, with 3s holds once her gets all the way up  Marching in bars Hip abd in bars Step ups 4" at stairs    PATIENT EDUCATION:  Education details: POC/HEP Person educated: Patient Education method: Programmer, multimedia, Facilities manager, Verbal cues, and Handouts Education comprehension: verbalized understanding  HOME EXERCISE PROGRAM: Access Code: [email or web address] URL: [email or web address] Date: 10/18/2022 Prepared by: Stacie Glaze  Exercises - Standing Hip Abduction with Counter Support  - 1 x daily - 7 x weekly - 2 sets - 10 reps - 3 hold - Standing March with Counter Support  - 1 x daily - 7 x weekly - 2 sets - 10 reps - 3 hold - Heel Raises with Counter Support  - 1 x daily - 7 x weekly - 2 sets - 10 reps - 3 hold  ASSESSMENT:  CLINICAL IMPRESSION: Patient is a 82 y.o. male who was seen today for physical therapy  treatment for  poor gait, repeated falls and the left shoulder stiffness and weakness. Tried more intervention in standing without AD, overall pt did well but did have some instability. CGA needed with alt box taps. Constant cues needed not to drag LE wit side steps . Pt has very little ankle strategies to aid with balance. PF/DF are very  weak.   OBJECTIVE IMPAIRMENTS: Abnormal gait, cardiopulmonary status limiting activity, decreased activity tolerance, decreased balance, decreased endurance, decreased mobility, difficulty walking, decreased ROM, decreased strength, increased muscle spasms, impaired flexibility, and pain.   REHAB POTENTIAL: Good  CLINICAL DECISION MAKING: Stable/uncomplicated  EVALUATION COMPLEXITY: Low   GOALS: Goals reviewed with patient? Yes  SHORT TERM GOALS: Target date: 11/01/22 Independent with initial HEP Goal status: Met 11/01/22  LONG TERM GOALS: Target date: 01/18/23  Independent with advanced HEP Goal status: INITIAL  2.  Increase Berg balance test score to 40/56 Goal status: 29 Progression  11/27/22  3.  Decrease TUG time to 19 seconds Goal status: 14.07 Met 11/27/22 w/ RW  4.  Increase left shoulder IR to 60 degrees Goal status: 25 deg ongoing 11/27/22  5.  Increase LE strength to 4/5 Goal status: Met 11/27/42  PLAN:  PT FREQUENCY: 1-2x/week  PT DURATION: 12 weeks  PLANNED INTERVENTIONS: Therapeutic exercises, Therapeutic activity, Neuromuscular re-education, Balance training, Gait training, Patient/Family education, Self Care, Joint mobilization, Stair training, and Manual therapy  PLAN FOR NEXT SESSION: balance and LE strengthening, conitnue to look at and try to help left shoulder function   Grayce Sessions, PTA 11/29/2022, 11:44 AM

## 2022-11-29 NOTE — Telephone Encounter (Signed)
Prescription Request  11/29/2022  LOV: 10/01/2022  What is the name of the medication or equipment? Amlodopine 5mg .  Have you contacted your pharmacy to request a refill? Yes   Which pharmacy would you like this sent to?  [id number] [place] Otto, [place] - [phone] AT [facility] OF [facility] [address] [place] [address] Phone: [phone] Fax: [phone]  Patient notified that their request is being sent to the clinical staff for review and that they should receive a response within 2 business days.   Please advise at Mobile [phone] (mobile)

## 2022-12-03 ENCOUNTER — Inpatient Hospital Stay: Payer: Medicare Other | Admitting: Hematology

## 2022-12-03 NOTE — Therapy (Signed)
OUTPATIENT PHYSICAL THERAPY LOWER EXTREMITY TREATMENT    Patient Name: Harold Pittman MRN: [id number] DOB:12-02-1940, 82 y.o., male Today's Date: 12/04/2022  END OF SESSION:  PT End of Session - 12/04/22 1145     Visit Number 12    Date for PT Re-Evaluation 01/18/23    Authorization Type [employer] [place]    PT Start Time 1145    PT Stop Time 1230    PT Time Calculation (min) 45 min    Activity Tolerance Patient tolerated treatment well    Behavior During Therapy WFL for tasks assessed/performed                Past Medical History:  Diagnosis Date   Anemia    low iron   Anxiety state, unspecified    Blood in stool    Chronic airway obstruction, not elsewhere classified    Coronary artery disease    Coronary atherosclerosis of artery bypass graft    Dyspnea    ED (erectile dysfunction)    Esophageal reflux    Gout, unspecified    Osteoarthrosis, unspecified whether generalized or localized, unspecified site    Other and unspecified hyperlipidemia    Other specified disorder of rectum and anus    Personal history of tobacco use, presenting hazards to health    Sleep apnea    does not use cpap   Type II or unspecified type diabetes mellitus without mention of complication, not stated as uncontrolled    Unspecified essential hypertension    Past Surgical History:  Procedure Laterality Date   BIOPSY  09/26/2017   Procedure: BIOPSY;  Surgeon: Meryl Dare, MD;  Location: [facility] ENDOSCOPY;  Service: Endoscopy;;   BIOPSY  02/03/2018   Procedure: BIOPSY;  Surgeon: Lemar Lofty., MD;  Location: [facility] ENDOSCOPY;  Service: Gastroenterology;;   COLONOSCOPY WITH PROPOFOL N/A 09/26/2017   Procedure: COLONOSCOPY WITH PROPOFOL;  Surgeon: Meryl Dare, MD;  Location: [facility] ENDOSCOPY;  Service: Endoscopy;  Laterality: N/A;   CORONARY ARTERY BYPASS GRAFT  97   LIMA to LAD, sequential saphenous vein graft to the first and second diagonal, sequential saphenous vein graft to the  intermediate OM and circumflex and SVG to RCA   ESOPHAGOGASTRODUODENOSCOPY (EGD) WITH PROPOFOL N/A 09/26/2017   Procedure: ESOPHAGOGASTRODUODENOSCOPY (EGD) WITH PROPOFOL;  Surgeon: Meryl Dare, MD;  Location: [facility] ENDOSCOPY;  Service: Endoscopy;  Laterality: N/A;   ESOPHAGOGASTRODUODENOSCOPY (EGD) WITH PROPOFOL N/A 02/03/2018   Procedure: ESOPHAGOGASTRODUODENOSCOPY (EGD);  Surgeon: Lemar Lofty., MD;  Location: [facility] ENDOSCOPY;  Service: Gastroenterology;  Laterality: N/A;   KNEE SURGERY     BILATERAL   POLYPECTOMY  09/26/2017   Procedure: POLYPECTOMY;  Surgeon: Meryl Dare, MD;  Location: [facility] ENDOSCOPY;  Service: Endoscopy;;   ROTATOR CUFF REPAIR     SUBMUCOSAL INJECTION  02/03/2018   Procedure: SUBMUCOSAL INJECTION;  Surgeon: Lemar Lofty., MD;  Location: [facility] ENDOSCOPY;  Service: Gastroenterology;;   Patient Active Problem List   Diagnosis Date Noted   Acute gouty arthritis 03/28/2022   Gout flare 01/28/2022   Peripheral neuropathy 01/15/2022   Ataxia 01/15/2022   Type 2 diabetes mellitus (A1c 6.0 on 05/03/2021) with steroid-induced hyperglycemia 05/16/2021   CKD (chronic kidney disease) stage 4, GFR 15-29 ml/min (HCC) 05/15/2021   Olecranon bursitis, right elbow 05/15/2021   Tremor 05/15/2021   Elbow pain, left 12/05/2020   Constipation 12/05/2020   Atherosclerosis of aorta (HCC) 12/05/2020   Chest pain, atypical 12/05/2020   CTS (carpal tunnel syndrome) 03/24/2020  Grief at loss of child 03/24/2020   Cholelithiasis 09/16/2019   Nausea 09/02/2019   Weight loss 09/02/2019   Elevated troponin 01/12/2019   Hypertensive urgency 01/10/2019   Well adult exam 10/20/2018   Balanitis 10/20/2018   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/27/2017   Adenoma of stomach 12/25/2017   Abnormal findings on esophagogastroduodenoscopy (EGD) 12/25/2017   Sebaceous cyst 10/04/2017   Iron deficiency anemia    Benign neoplasm of sigmoid colon    Benign neoplasm of  cecum    Benign neoplasm of transverse colon    Positive occult stool blood test 09/24/2017   (HFpEF) heart failure with preserved ejection fraction (HCC) 09/24/2017   Hypokalemia 08/17/2015   Generalized weakness 07/22/2015   Gait disorder 03/18/2014   Low back pain 01/12/2014   History of colon polyps 12/17/2013   DM (diabetes mellitus), type 2 with peripheral vascular complications (HCC) 09/16/2013   Diarrhea 10/08/2012   Edema 10/08/2011   DOE (dyspnea on exertion) 06/29/2011   LATERAL EPICONDYLITIS, LEFT 02/20/2010   TOBACCO USE, QUIT 02/23/2009   Cough 02/16/2009   Hearing loss 10/15/2008   ERECTILE DYSFUNCTION 04/30/2008   Coronary artery disease involving native coronary artery of native heart without angina pectoris 04/30/2008   COPD (chronic obstructive pulmonary disease) (HCC) 05/21/2007   Anxiety state 05/20/2007   Insomnia 03/04/2007   Hyperlipidemia LDL goal <70 02/01/2007   Gout attack 02/01/2007   Essential hypertension 02/01/2007   GERD 02/01/2007   Osteoarthritis 02/01/2007    PCP: Posey Rea, MD  REFERRING PROVIDER: Plotnikov, MD  REFERRING DIAG: Ataxic gait  THERAPY DIAG:  Muscle weakness (generalized)  Difficulty in walking, not elsewhere classified  Other abnormalities of gait and mobility  Rationale for Evaluation and Treatment: Rehabilitation  ONSET DATE: 10/15/22  SUBJECTIVE:   SUBJECTIVE STATEMENT: "I feel real good"   PERTINENT HISTORY: See above PAIN:  Are you having pain? Yes: NPRS scale: 0/10 Pain location: left shoulder upper arm Pain description: sharp Aggravating factors: reaching back to get wallet 6/10 Relieving factors: rest not moving no pain  PRECAUTIONS: Fall  RED FLAGS: None   WEIGHT BEARING RESTRICTIONS: No  FALLS:  Has patient fallen in last 6 months? Yes. Number of falls 2  LIVING ENVIRONMENT: Lives with: lives alone Lives in: House/apartment Stairs: No Has following equipment at home: Single point cane,  Environmental consultant - 4 wheeled, Marine scientist  OCCUPATION: retired Psychologist, occupational  PLOF: Independent  PATIENT GOALS: walk better, have better balance, reach wallet  NEXT MD VISIT: none  OBJECTIVE:   DIAGNOSTIC FINDINGS: none  COGNITION: Overall cognitive status: Within functional limits for tasks assessed     SENSATION: WFL  POSTURE: rounded shoulders and forward head  PALPATION: Non tender  LOWER EXTREMITY ROM: WFL's except ankle DF was unable to get to neutral and had very poor control of lateral motions  UPPER EXTREMITY ROM:  left shoulder flexion 140, abduction 130, ER 60, IR 20 degrees with IR painful  SHOULDER MMT:  flexion/abduction 3/5, ER 3+/5, IR 3+/5 pain   LOWER EXTREMITY MMT:  MMT Right eval Left eval Right 11/27/22 Left 11/27/22  Hip flexion 4- 4- 4 4+  Hip extension 4- 4- 4 4  Hip abduction 3+ 3+ 4+ 4+  Hip adduction      Hip internal rotation      Hip external rotation      Knee flexion 4- 4- 4 4  Knee extension 4- 4- 4+ 4+  Ankle dorsiflexion 1 1  Ankle plantarflexion 2 2    Ankle inversion 3+ 3+    Ankle eversion 3+ 3+     (Blank rows = not tested) FUNCTIONAL TESTS:  5 times sit to stand: 50 seconds had to use hands after multiple attmepts Timed up and go (TUG): 32 seconds with holding wall Berg Balance Scale: 27/56  GAIT: Distance walked: 100 feet Assistive device utilized: Single point cane Level of assistance: SBA Comments: slow, tends to use walls and furniture   TODAY'S TREATMENT:                                                                                                                              DATE 12/04/22 NuStep L5 x98mins  Leg ext 15# 2x12 HS curls 35# 2x12 Ankle 4 way with yellow band x10 STS 2x10 Alt box taps 4" CGA Step ups 4" holding on to railings  Leg press 40# 2x12     11/29/22 NuStep L5 x 6 min HS curls 45lb 2x12 Leg Ext 15lb 2x10 Standing reaching outside BOS, then standing marches 2x10 CGA  S2S 2x10 Side  step at mat table  Alt 4in box taps 2x5 CGA  Leg press 40lb 3x12  11/27/22 NuStep L5 x 6 min Checked Goals Leg press 40lb 2x12 4in step ups x5 each 1 rail  815/24 NuStep L5x  HS curls 35# 2x15 Leg ext 10 2x15 Leg press 30# 2x12, heel raises 30lb 2x15 STS 2x10 Side step at mat table  Alt 4 in box taps CGA to SBA 2x5  11/20/22 NuStep L5x  3# marching 20 reps alt x2 3# hip abd 2x10 STS 2x10 Leg ext 10 2x15 HS curls 35# 2x15 Leg press 30# 2x12 In RW 4" box taps    11/13/22 NuStep L5 x 7 min HS curls 35lb 2x15 Leg Ext 10lb 2x15 S2S elevated mat 2x10 Standing march & abd 3lb cuff 2x10 in RW  11/08/22 NuStep L5 x58mins  HS curls 35lb 2x12 Leg Ext 10lb 2x12  STS x10,x5 from elevated mat table, with 3s holds once her gets all the way up  Standing march 3lb cuff 2x10 in RW Side step at mat table  Leg press 30lb 3x12  11/06/22 NuStep L5 x88mins  HS curls 35lb 2x12 Leg Ext 10lb 2x12 STS 4x5 from elevated mat table, with 3s holds once her gets all the way up  Marching in bars Hip abd in bars Step ups 4" at stairs    PATIENT EDUCATION:  Education details: POC/HEP Person educated: Patient Education method: Programmer, multimedia, Facilities manager, Verbal cues, and Handouts Education comprehension: verbalized understanding  HOME EXERCISE PROGRAM: Access Code: [email or web address] URL: [email or web address] Date: 10/18/2022 Prepared by: Stacie Glaze  Exercises - Standing Hip Abduction with Counter Support  - 1 x daily - 7 x weekly - 2 sets - 10 reps - 3 hold - Standing March with Counter Support  - 1 x daily - 7 x weekly - 2 sets -  10 reps - 3 hold - Heel Raises with Counter Support  - 1 x daily - 7 x weekly - 2 sets - 10 reps - 3 hold  ASSESSMENT:  CLINICAL IMPRESSION: Patient is a 82 y.o. male who was seen today for physical therapy  treatment for poor gait, repeated falls and the left shoulder stiffness and weakness. CGA needed with alt box taps and 2HHA for  step ups. Constant cues needed not to drag feet but he does have very weak dorsiflexors. Did some ankle strengthening, he is only able to demonstrate minimal ankle DF but does well with other directions.    OBJECTIVE IMPAIRMENTS: Abnormal gait, cardiopulmonary status limiting activity, decreased activity tolerance, decreased balance, decreased endurance, decreased mobility, difficulty walking, decreased ROM, decreased strength, increased muscle spasms, impaired flexibility, and pain.   REHAB POTENTIAL: Good  CLINICAL DECISION MAKING: Stable/uncomplicated  EVALUATION COMPLEXITY: Low   GOALS: Goals reviewed with patient? Yes  SHORT TERM GOALS: Target date: 11/01/22 Independent with initial HEP Goal status: Met 11/01/22  LONG TERM GOALS: Target date: 01/18/23  Independent with advanced HEP Goal status: INITIAL  2.  Increase Berg balance test score to 40/56 Goal status: 29 Progression  11/27/22  3.  Decrease TUG time to 19 seconds Goal status: 14.07 Met 11/27/22 w/ RW  4.  Increase left shoulder IR to 60 degrees Goal status: 25 deg ongoing 11/27/22  5.  Increase LE strength to 4/5 Goal status: Met 11/27/42  PLAN:  PT FREQUENCY: 1-2x/week  PT DURATION: 12 weeks  PLANNED INTERVENTIONS: Therapeutic exercises, Therapeutic activity, Neuromuscular re-education, Balance training, Gait training, Patient/Family education, Self Care, Joint mobilization, Stair training, and Manual therapy  PLAN FOR NEXT SESSION: balance and LE strengthening, conitnue to look at and try to help left shoulder function   Cassie Freer, PT 12/04/2022, 12:28 PM

## 2022-12-04 ENCOUNTER — Ambulatory Visit: Payer: Medicare Other

## 2022-12-04 DIAGNOSIS — R296 Repeated falls: Secondary | ICD-10-CM | POA: Diagnosis not present

## 2022-12-04 DIAGNOSIS — M6281 Muscle weakness (generalized): Secondary | ICD-10-CM

## 2022-12-04 DIAGNOSIS — R2689 Other abnormalities of gait and mobility: Secondary | ICD-10-CM

## 2022-12-04 DIAGNOSIS — R262 Difficulty in walking, not elsewhere classified: Secondary | ICD-10-CM

## 2022-12-05 NOTE — Therapy (Signed)
OUTPATIENT PHYSICAL THERAPY LOWER EXTREMITY TREATMENT    Patient Name: Harold Pittman MRN: [id number] DOB:02-Nov-1940, 82 y.o., male Today's Date: 12/06/2022  END OF SESSION:  PT End of Session - 12/06/22 1141     Visit Number 13    Date for PT Re-Evaluation 01/18/23    Authorization Type [employer] [place]    PT Start Time 1140    PT Stop Time 1225    PT Time Calculation (min) 45 min    Activity Tolerance Patient tolerated treatment well    Behavior During Therapy WFL for tasks assessed/performed                 Past Medical History:  Diagnosis Date   Anemia    low iron   Anxiety state, unspecified    Blood in stool    Chronic airway obstruction, not elsewhere classified    Coronary artery disease    Coronary atherosclerosis of artery bypass graft    Dyspnea    ED (erectile dysfunction)    Esophageal reflux    Gout, unspecified    Osteoarthrosis, unspecified whether generalized or localized, unspecified site    Other and unspecified hyperlipidemia    Other specified disorder of rectum and anus    Personal history of tobacco use, presenting hazards to health    Sleep apnea    does not use cpap   Type II or unspecified type diabetes mellitus without mention of complication, not stated as uncontrolled    Unspecified essential hypertension    Past Surgical History:  Procedure Laterality Date   BIOPSY  09/26/2017   Procedure: BIOPSY;  Surgeon: Meryl Dare, MD;  Location: [facility] ENDOSCOPY;  Service: Endoscopy;;   BIOPSY  02/03/2018   Procedure: BIOPSY;  Surgeon: Lemar Lofty., MD;  Location: [facility] ENDOSCOPY;  Service: Gastroenterology;;   COLONOSCOPY WITH PROPOFOL N/A 09/26/2017   Procedure: COLONOSCOPY WITH PROPOFOL;  Surgeon: Meryl Dare, MD;  Location: [facility] ENDOSCOPY;  Service: Endoscopy;  Laterality: N/A;   CORONARY ARTERY BYPASS GRAFT  97   LIMA to LAD, sequential saphenous vein graft to the first and second diagonal, sequential saphenous vein graft to the  intermediate OM and circumflex and SVG to RCA   ESOPHAGOGASTRODUODENOSCOPY (EGD) WITH PROPOFOL N/A 09/26/2017   Procedure: ESOPHAGOGASTRODUODENOSCOPY (EGD) WITH PROPOFOL;  Surgeon: Meryl Dare, MD;  Location: [facility] ENDOSCOPY;  Service: Endoscopy;  Laterality: N/A;   ESOPHAGOGASTRODUODENOSCOPY (EGD) WITH PROPOFOL N/A 02/03/2018   Procedure: ESOPHAGOGASTRODUODENOSCOPY (EGD);  Surgeon: Lemar Lofty., MD;  Location: [facility] ENDOSCOPY;  Service: Gastroenterology;  Laterality: N/A;   KNEE SURGERY     BILATERAL   POLYPECTOMY  09/26/2017   Procedure: POLYPECTOMY;  Surgeon: Meryl Dare, MD;  Location: [facility] ENDOSCOPY;  Service: Endoscopy;;   ROTATOR CUFF REPAIR     SUBMUCOSAL INJECTION  02/03/2018   Procedure: SUBMUCOSAL INJECTION;  Surgeon: Lemar Lofty., MD;  Location: [facility] ENDOSCOPY;  Service: Gastroenterology;;   Patient Active Problem List   Diagnosis Date Noted   Acute gouty arthritis 03/28/2022   Gout flare 01/28/2022   Peripheral neuropathy 01/15/2022   Ataxia 01/15/2022   Type 2 diabetes mellitus (A1c 6.0 on 05/03/2021) with steroid-induced hyperglycemia 05/16/2021   CKD (chronic kidney disease) stage 4, GFR 15-29 ml/min (HCC) 05/15/2021   Olecranon bursitis, right elbow 05/15/2021   Tremor 05/15/2021   Elbow pain, left 12/05/2020   Constipation 12/05/2020   Atherosclerosis of aorta (HCC) 12/05/2020   Chest pain, atypical 12/05/2020   CTS (carpal tunnel syndrome)  03/24/2020   Grief at loss of child 03/24/2020   Cholelithiasis 09/16/2019   Nausea 09/02/2019   Weight loss 09/02/2019   Elevated troponin 01/12/2019   Hypertensive urgency 01/10/2019   Well adult exam 10/20/2018   Balanitis 10/20/2018   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/27/2017   Adenoma of stomach 12/25/2017   Abnormal findings on esophagogastroduodenoscopy (EGD) 12/25/2017   Sebaceous cyst 10/04/2017   Iron deficiency anemia    Benign neoplasm of sigmoid colon    Benign neoplasm of  cecum    Benign neoplasm of transverse colon    Positive occult stool blood test 09/24/2017   (HFpEF) heart failure with preserved ejection fraction (HCC) 09/24/2017   Hypokalemia 08/17/2015   Generalized weakness 07/22/2015   Gait disorder 03/18/2014   Low back pain 01/12/2014   History of colon polyps 12/17/2013   DM (diabetes mellitus), type 2 with peripheral vascular complications (HCC) 09/16/2013   Diarrhea 10/08/2012   Edema 10/08/2011   DOE (dyspnea on exertion) 06/29/2011   LATERAL EPICONDYLITIS, LEFT 02/20/2010   TOBACCO USE, QUIT 02/23/2009   Cough 02/16/2009   Hearing loss 10/15/2008   ERECTILE DYSFUNCTION 04/30/2008   Coronary artery disease involving native coronary artery of native heart without angina pectoris 04/30/2008   COPD (chronic obstructive pulmonary disease) (HCC) 05/21/2007   Anxiety state 05/20/2007   Insomnia 03/04/2007   Hyperlipidemia LDL goal <70 02/01/2007   Gout attack 02/01/2007   Essential hypertension 02/01/2007   GERD 02/01/2007   Osteoarthritis 02/01/2007    PCP: Posey Rea, MD  REFERRING PROVIDER: Plotnikov, MD  REFERRING DIAG: Ataxic gait  THERAPY DIAG:  Muscle weakness (generalized)  Difficulty in walking, not elsewhere classified  Other abnormalities of gait and mobility  Rationale for Evaluation and Treatment: Rehabilitation  ONSET DATE: 10/15/22  SUBJECTIVE:   SUBJECTIVE STATEMENT: I am alright   PERTINENT HISTORY: See above PAIN:  Are you having pain? Yes: NPRS scale: 0/10 Pain location: left shoulder upper arm Pain description: sharp Aggravating factors: reaching back to get wallet 6/10 Relieving factors: rest not moving no pain  PRECAUTIONS: Fall  RED FLAGS: None   WEIGHT BEARING RESTRICTIONS: No  FALLS:  Has patient fallen in last 6 months? Yes. Number of falls 2  LIVING ENVIRONMENT: Lives with: lives alone Lives in: House/apartment Stairs: No Has following equipment at home: Single point cane,  Environmental consultant - 4 wheeled, Marine scientist  OCCUPATION: retired Psychologist, occupational  PLOF: Independent  PATIENT GOALS: walk better, have better balance, reach wallet  NEXT MD VISIT: none  OBJECTIVE:   DIAGNOSTIC FINDINGS: none  COGNITION: Overall cognitive status: Within functional limits for tasks assessed     SENSATION: WFL  POSTURE: rounded shoulders and forward head  PALPATION: Non tender  LOWER EXTREMITY ROM: WFL's except ankle DF was unable to get to neutral and had very poor control of lateral motions  UPPER EXTREMITY ROM:  left shoulder flexion 140, abduction 130, ER 60, IR 20 degrees with IR painful  SHOULDER MMT:  flexion/abduction 3/5, ER 3+/5, IR 3+/5 pain   LOWER EXTREMITY MMT:  MMT Right eval Left eval Right 11/27/22 Left 11/27/22  Hip flexion 4- 4- 4 4+  Hip extension 4- 4- 4 4  Hip abduction 3+ 3+ 4+ 4+  Hip adduction      Hip internal rotation      Hip external rotation      Knee flexion 4- 4- 4 4  Knee extension 4- 4- 4+ 4+  Ankle dorsiflexion 1 1  Ankle plantarflexion 2 2    Ankle inversion 3+ 3+    Ankle eversion 3+ 3+     (Blank rows = not tested) FUNCTIONAL TESTS:  5 times sit to stand: 50 seconds had to use hands after multiple attmepts Timed up and go (TUG): 32 seconds with holding wall Berg Balance Scale: 27/56  GAIT: Distance walked: 100 feet Assistive device utilized: Single point cane Level of assistance: SBA Comments: slow, tends to use walls and furniture   TODAY'S TREATMENT:                                                                                                                              DATE 12/06/22 NuStep L5 x63mins  Standing march & abd 3lb cuff 2x10 in RW Calf raises holding RW 2x12 STS 2x10 Side steps along mat CGA Leg ext 15# 3x10 HS curls 35# 3x10  12/04/22 NuStep L5 x21mins  Leg ext 15# 2x12 HS curls 35# 2x12 Ankle 4 way with yellow band x10 STS 2x10 Alt box taps 4" CGA Step ups 4" holding on to railings   Leg press 40# 2x12     11/29/22 NuStep L5 x 6 min HS curls 45lb 2x12 Leg Ext 15lb 2x10 Standing reaching outside BOS, then standing marches 2x10 CGA  S2S 2x10 Side step at mat table  Alt 4in box taps 2x5 CGA  Leg press 40lb 3x12  11/27/22 NuStep L5 x 6 min Checked Goals Leg press 40lb 2x12 4in step ups x5 each 1 rail  815/24 NuStep L5x  HS curls 35# 2x15 Leg ext 10 2x15 Leg press 30# 2x12, heel raises 30lb 2x15 STS 2x10 Side step at mat table  Alt 4 in box taps CGA to SBA 2x5  11/20/22 NuStep L5x  3# marching 20 reps alt x2 3# hip abd 2x10 STS 2x10 Leg ext 10 2x15 HS curls 35# 2x15 Leg press 30# 2x12 In RW 4" box taps    11/13/22 NuStep L5 x 7 min HS curls 35lb 2x15 Leg Ext 10lb 2x15 S2S elevated mat 2x10 Standing march & abd 3lb cuff 2x10 in RW  11/08/22 NuStep L5 x89mins  HS curls 35lb 2x12 Leg Ext 10lb 2x12  STS x10,x5 from elevated mat table, with 3s holds once her gets all the way up  Standing march 3lb cuff 2x10 in RW Side step at mat table  Leg press 30lb 3x12  11/06/22 NuStep L5 x7mins  HS curls 35lb 2x12 Leg Ext 10lb 2x12 STS 4x5 from elevated mat table, with 3s holds once her gets all the way up  Marching in bars Hip abd in bars Step ups 4" at stairs    PATIENT EDUCATION:  Education details: POC/HEP Person educated: Patient Education method: Programmer, multimedia, Facilities manager, Verbal cues, and Handouts Education comprehension: verbalized understanding  HOME EXERCISE PROGRAM: Access Code: [email or web address] URL: [email or web address] Date: 10/18/2022 Prepared by: Stacie Glaze  Exercises - Standing Hip Abduction with Counter Support  -  1 x daily - 7 x weekly - 2 sets - 10 reps - 3 hold - Standing March with Counter Support  - 1 x daily - 7 x weekly - 2 sets - 10 reps - 3 hold - Heel Raises with Counter Support  - 1 x daily - 7 x weekly - 2 sets - 10 reps - 3 hold  ASSESSMENT:  CLINICAL IMPRESSION: Patient is a 82 y.o.  male who was seen today for physical therapy  treatment for poor gait and repeated falls. Patient reports he feels like he is doing a whole lot better. However, he still is unstable and presents as a fall risk. Does really well with STS today, able to demonstrate without UE use and no LOB. CGA needed with side steps, 2x LOB backwards on to mat table. Has poor foot clearance.     OBJECTIVE IMPAIRMENTS: Abnormal gait, cardiopulmonary status limiting activity, decreased activity tolerance, decreased balance, decreased endurance, decreased mobility, difficulty walking, decreased ROM, decreased strength, increased muscle spasms, impaired flexibility, and pain.   REHAB POTENTIAL: Good  CLINICAL DECISION MAKING: Stable/uncomplicated  EVALUATION COMPLEXITY: Low   GOALS: Goals reviewed with patient? Yes  SHORT TERM GOALS: Target date: 11/01/22 Independent with initial HEP Goal status: Met 11/01/22  LONG TERM GOALS: Target date: 01/18/23  Independent with advanced HEP Goal status: INITIAL  2.  Increase Berg balance test score to 40/56 Goal status: 29 Progression  11/27/22  3.  Decrease TUG time to 19 seconds Goal status: 14.07 Met 11/27/22 w/ RW  4.  Increase left shoulder IR to 60 degrees Goal status: 25 deg ongoing 11/27/22  5.  Increase LE strength to 4/5 Goal status: Met 11/27/42  PLAN:  PT FREQUENCY: 1-2x/week  PT DURATION: 12 weeks  PLANNED INTERVENTIONS: Therapeutic exercises, Therapeutic activity, Neuromuscular re-education, Balance training, Gait training, Patient/Family education, Self Care, Joint mobilization, Stair training, and Manual therapy  PLAN FOR NEXT SESSION: balance and LE strengthening, conitnue to look at and try to help left shoulder function   Cassie Freer, PT 12/06/2022, 12:20 PM

## 2022-12-06 ENCOUNTER — Ambulatory Visit: Payer: Medicare Other

## 2022-12-06 DIAGNOSIS — R296 Repeated falls: Secondary | ICD-10-CM | POA: Diagnosis not present

## 2022-12-06 DIAGNOSIS — M6281 Muscle weakness (generalized): Secondary | ICD-10-CM

## 2022-12-06 DIAGNOSIS — R2689 Other abnormalities of gait and mobility: Secondary | ICD-10-CM | POA: Diagnosis not present

## 2022-12-06 DIAGNOSIS — R262 Difficulty in walking, not elsewhere classified: Secondary | ICD-10-CM

## 2022-12-12 ENCOUNTER — Ambulatory Visit: Payer: Medicare Other | Admitting: Physical Therapy

## 2022-12-18 ENCOUNTER — Encounter: Payer: Self-pay | Admitting: Physical Therapy

## 2022-12-18 ENCOUNTER — Ambulatory Visit: Payer: Medicare Other | Attending: Family Medicine | Admitting: Physical Therapy

## 2022-12-18 DIAGNOSIS — R262 Difficulty in walking, not elsewhere classified: Secondary | ICD-10-CM | POA: Insufficient documentation

## 2022-12-18 DIAGNOSIS — R2689 Other abnormalities of gait and mobility: Secondary | ICD-10-CM | POA: Insufficient documentation

## 2022-12-18 DIAGNOSIS — R296 Repeated falls: Secondary | ICD-10-CM | POA: Insufficient documentation

## 2022-12-18 DIAGNOSIS — M6281 Muscle weakness (generalized): Secondary | ICD-10-CM | POA: Insufficient documentation

## 2022-12-18 NOTE — Therapy (Signed)
OUTPATIENT PHYSICAL THERAPY LOWER EXTREMITY TREATMENT    Patient Name: Harold Pittman MRN: [id number] DOB:May 21, 1940, 82 y.o., male Today's Date: 12/18/2022  END OF SESSION:  PT End of Session - 12/18/22 1143     Visit Number 14    Date for PT Re-Evaluation 01/18/23    PT Start Time 1145    PT Stop Time 1230    PT Time Calculation (min) 45 min    Activity Tolerance Patient tolerated treatment well    Behavior During Therapy WFL for tasks assessed/performed                 Past Medical History:  Diagnosis Date   Anemia    low iron   Anxiety state, unspecified    Blood in stool    Chronic airway obstruction, not elsewhere classified    Coronary artery disease    Coronary atherosclerosis of artery bypass graft    Dyspnea    ED (erectile dysfunction)    Esophageal reflux    Gout, unspecified    Osteoarthrosis, unspecified whether generalized or localized, unspecified site    Other and unspecified hyperlipidemia    Other specified disorder of rectum and anus    Personal history of tobacco use, presenting hazards to health    Sleep apnea    does not use cpap   Type II or unspecified type diabetes mellitus without mention of complication, not stated as uncontrolled    Unspecified essential hypertension    Past Surgical History:  Procedure Laterality Date   BIOPSY  09/26/2017   Procedure: BIOPSY;  Surgeon: Meryl Dare, MD;  Location: [facility] ENDOSCOPY;  Service: Endoscopy;;   BIOPSY  02/03/2018   Procedure: BIOPSY;  Surgeon: Lemar Lofty., MD;  Location: [facility] ENDOSCOPY;  Service: Gastroenterology;;   COLONOSCOPY WITH PROPOFOL N/A 09/26/2017   Procedure: COLONOSCOPY WITH PROPOFOL;  Surgeon: Meryl Dare, MD;  Location: [facility] ENDOSCOPY;  Service: Endoscopy;  Laterality: N/A;   CORONARY ARTERY BYPASS GRAFT  97   LIMA to LAD, sequential saphenous vein graft to the first and second diagonal, sequential saphenous vein graft to the intermediate OM and circumflex and  SVG to RCA   ESOPHAGOGASTRODUODENOSCOPY (EGD) WITH PROPOFOL N/A 09/26/2017   Procedure: ESOPHAGOGASTRODUODENOSCOPY (EGD) WITH PROPOFOL;  Surgeon: Meryl Dare, MD;  Location: [facility] ENDOSCOPY;  Service: Endoscopy;  Laterality: N/A;   ESOPHAGOGASTRODUODENOSCOPY (EGD) WITH PROPOFOL N/A 02/03/2018   Procedure: ESOPHAGOGASTRODUODENOSCOPY (EGD);  Surgeon: Lemar Lofty., MD;  Location: [facility] ENDOSCOPY;  Service: Gastroenterology;  Laterality: N/A;   KNEE SURGERY     BILATERAL   POLYPECTOMY  09/26/2017   Procedure: POLYPECTOMY;  Surgeon: Meryl Dare, MD;  Location: [facility] ENDOSCOPY;  Service: Endoscopy;;   ROTATOR CUFF REPAIR     SUBMUCOSAL INJECTION  02/03/2018   Procedure: SUBMUCOSAL INJECTION;  Surgeon: Lemar Lofty., MD;  Location: [facility] ENDOSCOPY;  Service: Gastroenterology;;   Patient Active Problem List   Diagnosis Date Noted   Acute gouty arthritis 03/28/2022   Gout flare 01/28/2022   Peripheral neuropathy 01/15/2022   Ataxia 01/15/2022   Type 2 diabetes mellitus (A1c 6.0 on 05/03/2021) with steroid-induced hyperglycemia 05/16/2021   CKD (chronic kidney disease) stage 4, GFR 15-29 ml/min (HCC) 05/15/2021   Olecranon bursitis, right elbow 05/15/2021   Tremor 05/15/2021   Elbow pain, left 12/05/2020   Constipation 12/05/2020   Atherosclerosis of aorta (HCC) 12/05/2020   Chest pain, atypical 12/05/2020   CTS (carpal tunnel syndrome) 03/24/2020   Grief at loss of  child 03/24/2020   Cholelithiasis 09/16/2019   Nausea 09/02/2019   Weight loss 09/02/2019   Elevated troponin 01/12/2019   Hypertensive urgency 01/10/2019   Well adult exam 10/20/2018   Balanitis 10/20/2018   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/27/2017   Adenoma of stomach 12/25/2017   Abnormal findings on esophagogastroduodenoscopy (EGD) 12/25/2017   Sebaceous cyst 10/04/2017   Iron deficiency anemia    Benign neoplasm of sigmoid colon    Benign neoplasm of cecum    Benign neoplasm of  transverse colon    Positive occult stool blood test 09/24/2017   (HFpEF) heart failure with preserved ejection fraction (HCC) 09/24/2017   Hypokalemia 08/17/2015   Generalized weakness 07/22/2015   Gait disorder 03/18/2014   Low back pain 01/12/2014   History of colon polyps 12/17/2013   DM (diabetes mellitus), type 2 with peripheral vascular complications (HCC) 09/16/2013   Diarrhea 10/08/2012   Edema 10/08/2011   DOE (dyspnea on exertion) 06/29/2011   LATERAL EPICONDYLITIS, LEFT 02/20/2010   TOBACCO USE, QUIT 02/23/2009   Cough 02/16/2009   Hearing loss 10/15/2008   ERECTILE DYSFUNCTION 04/30/2008   Coronary artery disease involving native coronary artery of native heart without angina pectoris 04/30/2008   COPD (chronic obstructive pulmonary disease) (HCC) 05/21/2007   Anxiety state 05/20/2007   Insomnia 03/04/2007   Hyperlipidemia LDL goal <70 02/01/2007   Gout attack 02/01/2007   Essential hypertension 02/01/2007   GERD 02/01/2007   Osteoarthritis 02/01/2007    PCP: Posey Rea, MD  REFERRING PROVIDER: Plotnikov, MD  REFERRING DIAG: Ataxic gait  THERAPY DIAG:  Muscle weakness (generalized)  Difficulty in walking, not elsewhere classified  Repeated falls  Rationale for Evaluation and Treatment: Rehabilitation  ONSET DATE: 10/15/22  SUBJECTIVE:   SUBJECTIVE STATEMENT: "All right"  PERTINENT HISTORY: See above PAIN:  Are you having pain? Yes: NPRS scale: 0/10 Pain location: left shoulder upper arm Pain description: sharp Aggravating factors: reaching back to get wallet 6/10 Relieving factors: rest not moving no pain  PRECAUTIONS: Fall  RED FLAGS: None   WEIGHT BEARING RESTRICTIONS: No  FALLS:  Has patient fallen in last 6 months? Yes. Number of falls 2  LIVING ENVIRONMENT: Lives with: lives alone Lives in: House/apartment Stairs: No Has following equipment at home: Single point cane, Environmental consultant - 4 wheeled, Marine scientist  OCCUPATION: retired  Psychologist, occupational  PLOF: Independent  PATIENT GOALS: walk better, have better balance, reach wallet  NEXT MD VISIT: none  OBJECTIVE:   DIAGNOSTIC FINDINGS: none  COGNITION: Overall cognitive status: Within functional limits for tasks assessed     SENSATION: WFL  POSTURE: rounded shoulders and forward head  PALPATION: Non tender  LOWER EXTREMITY ROM: WFL's except ankle DF was unable to get to neutral and had very poor control of lateral motions  UPPER EXTREMITY ROM:  left shoulder flexion 140, abduction 130, ER 60, IR 20 degrees with IR painful  SHOULDER MMT:  flexion/abduction 3/5, ER 3+/5, IR 3+/5 pain   LOWER EXTREMITY MMT:  MMT Right eval Left eval Right 11/27/22 Left 11/27/22  Hip flexion 4- 4- 4 4+  Hip extension 4- 4- 4 4  Hip abduction 3+ 3+ 4+ 4+  Hip adduction      Hip internal rotation      Hip external rotation      Knee flexion 4- 4- 4 4  Knee extension 4- 4- 4+ 4+  Ankle dorsiflexion 1 1    Ankle plantarflexion 2 2    Ankle inversion 3+ 3+  Ankle eversion 3+ 3+     (Blank rows = not tested) FUNCTIONAL TESTS:  5 times sit to stand: 50 seconds had to use hands after multiple attmepts Timed up and go (TUG): 32 seconds with holding wall Berg Balance Scale: 27/56  GAIT: Distance walked: 100 feet Assistive device utilized: Single point cane Level of assistance: SBA Comments: slow, tends to use walls and furniture   TODAY'S TREATMENT:                                                                                                                              DATE 12/18/22 NuStep L5 x41mins  Leg ext 15# 3x10 HS curls 35# 3x10 S2S OHP yellow ball 2x10 Alt 4 in box taps  Standing ball catch  Side steps along mat CGA Shoulder Ext green 2x10  12/06/22 NuStep L5 x55mins  Standing march & abd 3lb cuff 2x10 in RW Calf raises holding RW 2x12 STS 2x10 Side steps along mat CGA Leg ext 15# 3x10 HS curls 35# 3x10  12/04/22 NuStep L5 x65mins  Leg ext 15#  2x12 HS curls 35# 2x12 Ankle 4 way with yellow band x10 STS 2x10 Alt box taps 4" CGA Step ups 4" holding on to railings  Leg press 40# 2x12     11/29/22 NuStep L5 x 6 min HS curls 45lb 2x12 Leg Ext 15lb 2x10 Standing reaching outside BOS, then standing marches 2x10 CGA  S2S 2x10 Side step at mat table  Alt 4in box taps 2x5 CGA  Leg press 40lb 3x12  11/27/22 NuStep L5 x 6 min Checked Goals Leg press 40lb 2x12 4in step ups x5 each 1 rail  815/24 NuStep L5x  HS curls 35# 2x15 Leg ext 10 2x15 Leg press 30# 2x12, heel raises 30lb 2x15 STS 2x10 Side step at mat table  Alt 4 in box taps CGA to SBA 2x5  11/20/22 NuStep L5x  3# marching 20 reps alt x2 3# hip abd 2x10 STS 2x10 Leg ext 10 2x15 HS curls 35# 2x15 Leg press 30# 2x12 In RW 4" box taps     PATIENT EDUCATION:  Education details: POC/HEP Person educated: Patient Education method: Programmer, multimedia, Facilities manager, Verbal cues, and Handouts Education comprehension: verbalized understanding  HOME EXERCISE PROGRAM: Access Code: [email or web address] URL: [email or web address] Date: 10/18/2022 Prepared by: Stacie Glaze  Exercises - Standing Hip Abduction with Counter Support  - 1 x daily - 7 x weekly - 2 sets - 10 reps - 3 hold - Standing March with Counter Support  - 1 x daily - 7 x weekly - 2 sets - 10 reps - 3 hold - Heel Raises with Counter Support  - 1 x daily - 7 x weekly - 2 sets - 10 reps - 3 hold  ASSESSMENT:  CLINICAL IMPRESSION: Patient is a 82 y.o. male who was seen today for physical therapy treatment for poor gait and repeated falls. Patient reports he feels like he is doing  a whole lot better. However, he still is unstable and presents as a fall risk. Does really well with STS OHP today, but does allow the back of legs to push against table at times. CGA needed with alt box taps due to pt instability. Has poor foot clearance with side steps .     OBJECTIVE IMPAIRMENTS: Abnormal  gait, cardiopulmonary status limiting activity, decreased activity tolerance, decreased balance, decreased endurance, decreased mobility, difficulty walking, decreased ROM, decreased strength, increased muscle spasms, impaired flexibility, and pain.   REHAB POTENTIAL: Good  CLINICAL DECISION MAKING: Stable/uncomplicated  EVALUATION COMPLEXITY: Low   GOALS: Goals reviewed with patient? Yes  SHORT TERM GOALS: Target date: 11/01/22 Independent with initial HEP Goal status: Met 11/01/22  LONG TERM GOALS: Target date: 01/18/23  Independent with advanced HEP Goal status: INITIAL  2.  Increase Berg balance test score to 40/56 Goal status: 29 Progression  11/27/22  3.  Decrease TUG time to 19 seconds Goal status: 14.07 Met 11/27/22 w/ RW  4.  Increase left shoulder IR to 60 degrees Goal status: 25 deg ongoing 11/27/22  5.  Increase LE strength to 4/5 Goal status: Met 11/27/42  PLAN:  PT FREQUENCY: 1-2x/week  PT DURATION: 12 weeks  PLANNED INTERVENTIONS: Therapeutic exercises, Therapeutic activity, Neuromuscular re-education, Balance training, Gait training, Patient/Family education, Self Care, Joint mobilization, Stair training, and Manual therapy  PLAN FOR NEXT SESSION: balance and LE strengthening, conitnue to look at and try to help left shoulder function   Grayce Sessions, PTA 12/18/2022, 11:44 AM

## 2022-12-20 ENCOUNTER — Ambulatory Visit: Payer: Medicare Other

## 2022-12-25 ENCOUNTER — Ambulatory Visit: Payer: Medicare Other | Admitting: Physical Therapy

## 2022-12-25 ENCOUNTER — Encounter: Payer: Self-pay | Admitting: Physical Therapy

## 2022-12-25 DIAGNOSIS — R2689 Other abnormalities of gait and mobility: Secondary | ICD-10-CM | POA: Diagnosis not present

## 2022-12-25 DIAGNOSIS — M6281 Muscle weakness (generalized): Secondary | ICD-10-CM | POA: Diagnosis not present

## 2022-12-25 DIAGNOSIS — R262 Difficulty in walking, not elsewhere classified: Secondary | ICD-10-CM | POA: Diagnosis not present

## 2022-12-25 DIAGNOSIS — R296 Repeated falls: Secondary | ICD-10-CM | POA: Diagnosis not present

## 2022-12-25 NOTE — Therapy (Signed)
OUTPATIENT PHYSICAL THERAPY LOWER EXTREMITY TREATMENT    Patient Name: Harold Pittman MRN: [id number] DOB:[birth year], 82 y.o., male Today's Date: 12/25/2022  END OF SESSION:  PT End of Session - 12/25/22 1141     Visit Number 15    Date for PT Re-Evaluation 01/18/23    PT Start Time 1144    PT Stop Time 1229    PT Time Calculation (min) 45 min    Activity Tolerance Patient tolerated treatment well    Behavior During Therapy WFL for tasks assessed/performed                  Past Medical History:  Diagnosis Date   Anemia    low iron   Anxiety state, unspecified    Blood in stool    Chronic airway obstruction, not elsewhere classified    Coronary artery disease    Coronary atherosclerosis of artery bypass graft    Dyspnea    ED (erectile dysfunction)    Esophageal reflux    Gout, unspecified    Osteoarthrosis, unspecified whether generalized or localized, unspecified site    Other and unspecified hyperlipidemia    Other specified disorder of rectum and anus    Personal history of tobacco use, presenting hazards to health    Sleep apnea    does not use cpap   Type II or unspecified type diabetes mellitus without mention of complication, not stated as uncontrolled    Unspecified essential hypertension    Past Surgical History:  Procedure Laterality Date   BIOPSY  09/26/2017   Procedure: BIOPSY;  Surgeon: Meryl Dare, MD;  Location: [facility] ENDOSCOPY;  Service: Endoscopy;;   BIOPSY  02/03/2018   Procedure: BIOPSY;  Surgeon: Lemar Lofty., MD;  Location: [facility] ENDOSCOPY;  Service: Gastroenterology;;   COLONOSCOPY WITH PROPOFOL N/A 09/26/2017   Procedure: COLONOSCOPY WITH PROPOFOL;  Surgeon: Meryl Dare, MD;  Location: [facility] ENDOSCOPY;  Service: Endoscopy;  Laterality: N/A;   CORONARY ARTERY BYPASS GRAFT  97   LIMA to LAD, sequential saphenous vein graft to the first and second diagonal, sequential saphenous vein graft to the intermediate OM and circumflex  and SVG to RCA   ESOPHAGOGASTRODUODENOSCOPY (EGD) WITH PROPOFOL N/A 09/26/2017   Procedure: ESOPHAGOGASTRODUODENOSCOPY (EGD) WITH PROPOFOL;  Surgeon: Meryl Dare, MD;  Location: [facility] ENDOSCOPY;  Service: Endoscopy;  Laterality: N/A;   ESOPHAGOGASTRODUODENOSCOPY (EGD) WITH PROPOFOL N/A 02/03/2018   Procedure: ESOPHAGOGASTRODUODENOSCOPY (EGD);  Surgeon: Lemar Lofty., MD;  Location: [facility] ENDOSCOPY;  Service: Gastroenterology;  Laterality: N/A;   KNEE SURGERY     BILATERAL   POLYPECTOMY  09/26/2017   Procedure: POLYPECTOMY;  Surgeon: Meryl Dare, MD;  Location: [facility] ENDOSCOPY;  Service: Endoscopy;;   ROTATOR CUFF REPAIR     SUBMUCOSAL INJECTION  02/03/2018   Procedure: SUBMUCOSAL INJECTION;  Surgeon: Lemar Lofty., MD;  Location: [facility] ENDOSCOPY;  Service: Gastroenterology;;   Patient Active Problem List   Diagnosis Date Noted   Acute gouty arthritis 03/28/2022   Gout flare 01/28/2022   Peripheral neuropathy 01/15/2022   Ataxia 01/15/2022   Type 2 diabetes mellitus (A1c 6.0 on 05/03/2021) with steroid-induced hyperglycemia 05/16/2021   CKD (chronic kidney disease) stage 4, GFR 15-29 ml/min (HCC) 05/15/2021   Olecranon bursitis, right elbow 05/15/2021   Tremor 05/15/2021   Elbow pain, left 12/05/2020   Constipation 12/05/2020   Atherosclerosis of aorta (HCC) 12/05/2020   Chest pain, atypical 12/05/2020   CTS (carpal tunnel syndrome) 03/24/2020   Grief at loss  of child 03/24/2020   Cholelithiasis 09/16/2019   Nausea 09/02/2019   Weight loss 09/02/2019   Elevated troponin 01/12/2019   Hypertensive urgency 01/10/2019   Well adult exam 10/20/2018   Balanitis 10/20/2018   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/27/2017   Adenoma of stomach 12/25/2017   Abnormal findings on esophagogastroduodenoscopy (EGD) 12/25/2017   Sebaceous cyst 10/04/2017   Iron deficiency anemia    Benign neoplasm of sigmoid colon    Benign neoplasm of cecum    Benign neoplasm of  transverse colon    Positive occult stool blood test 09/24/2017   (HFpEF) heart failure with preserved ejection fraction (HCC) 09/24/2017   Hypokalemia 08/17/2015   Generalized weakness 07/22/2015   Gait disorder 03/18/2014   Low back pain 01/12/2014   History of colon polyps 12/17/2013   DM (diabetes mellitus), type 2 with peripheral vascular complications (HCC) 09/16/2013   Diarrhea 10/08/2012   Edema 10/08/2011   DOE (dyspnea on exertion) 06/29/2011   LATERAL EPICONDYLITIS, LEFT 02/20/2010   TOBACCO USE, QUIT 02/23/2009   Cough 02/16/2009   Hearing loss 10/15/2008   ERECTILE DYSFUNCTION 04/30/2008   Coronary artery disease involving native coronary artery of native heart without angina pectoris 04/30/2008   COPD (chronic obstructive pulmonary disease) (HCC) 05/21/2007   Anxiety state 05/20/2007   Insomnia 03/04/2007   Hyperlipidemia LDL goal <70 02/01/2007   Gout attack 02/01/2007   Essential hypertension 02/01/2007   GERD 02/01/2007   Osteoarthritis 02/01/2007    PCP: Posey Rea, MD  REFERRING PROVIDER: Plotnikov, MD  REFERRING DIAG: Ataxic gait  THERAPY DIAG:  Muscle weakness (generalized)  Difficulty in walking, not elsewhere classified  Repeated falls  Rationale for Evaluation and Treatment: Rehabilitation  ONSET DATE: 10/15/22  SUBJECTIVE:   SUBJECTIVE STATEMENT: "I feel good" "I fell Thursday evening, was just walking and just fell. I didn't hurt myself"Sone come helped him up  PERTINENT HISTORY: See above PAIN:  Are you having pain? Yes: NPRS scale: 0/10 Pain location: left shoulder upper arm Pain description: sharp Aggravating factors: reaching back to get wallet 6/10 Relieving factors: rest not moving no pain  PRECAUTIONS: Fall  RED FLAGS: None   WEIGHT BEARING RESTRICTIONS: No  FALLS:  Has patient fallen in last 6 months? Yes. Number of falls 2  LIVING ENVIRONMENT: Lives with: lives alone Lives in: House/apartment Stairs: No Has  following equipment at home: Single point cane, Environmental consultant - 4 wheeled, Marine scientist  OCCUPATION: retired Psychologist, occupational  PLOF: Independent  PATIENT GOALS: walk better, have better balance, reach wallet  NEXT MD VISIT: none  OBJECTIVE:   DIAGNOSTIC FINDINGS: none  COGNITION: Overall cognitive status: Within functional limits for tasks assessed     SENSATION: WFL  POSTURE: rounded shoulders and forward head  PALPATION: Non tender  LOWER EXTREMITY ROM: WFL's except ankle DF was unable to get to neutral and had very poor control of lateral motions  UPPER EXTREMITY ROM:  left shoulder flexion 140, abduction 130, ER 60, IR 20 degrees with IR painful  SHOULDER MMT:  flexion/abduction 3/5, ER 3+/5, IR 3+/5 pain   LOWER EXTREMITY MMT:  MMT Right eval Left eval Right 11/27/22 Left 11/27/22  Hip flexion 4- 4- 4 4+  Hip extension 4- 4- 4 4  Hip abduction 3+ 3+ 4+ 4+  Hip adduction      Hip internal rotation      Hip external rotation      Knee flexion 4- 4- 4 4  Knee extension 4- 4- 4+  4+  Ankle dorsiflexion 1 1    Ankle plantarflexion 2 2    Ankle inversion 3+ 3+    Ankle eversion 3+ 3+     (Blank rows = not tested) FUNCTIONAL TESTS:  5 times sit to stand: 50 seconds had to use hands after multiple attmepts Timed up and go (TUG): 32 seconds with holding wall Berg Balance Scale: 27/56  GAIT: Distance walked: 100 feet Assistive device utilized: Single point cane Level of assistance: SBA Comments: slow, tends to use walls and furniture   TODAY'S TREATMENT:                                                                                                                              DATE 12/25/22 NuStep L5 x7 min 20 lb resisted Gait forward int resistance x3 CGA, attempted backwards walking x1 stopped due to pt inability to control eccentric phase Leg Curls 35lb 2x12 Leg Ext 10lb 2x12 6in step ups 1 rail x 10 each  12/20/22 NuStep Leg ext HS curls  Step up on airex in  bars  3# hip abd and ext  3# marching  3# calf raises Step ups     12/18/22 NuStep L5 x90mins  Leg ext 15# 3x10 HS curls 35# 3x10 S2S OHP yellow ball 2x10 Alt 4 in box taps  Standing ball catch  Side steps along mat CGA Shoulder Ext green 2x10  12/06/22 NuStep L5 x49mins  Standing march & abd 3lb cuff 2x10 in RW Calf raises holding RW 2x12 STS 2x10 Side steps along mat CGA Leg ext 15# 3x10 HS curls 35# 3x10  12/04/22 NuStep L5 x67mins  Leg ext 15# 2x12 HS curls 35# 2x12 Ankle 4 way with yellow band x10 STS 2x10 Alt box taps 4" CGA Step ups 4" holding on to railings  Leg press 40# 2x12     11/29/22 NuStep L5 x 6 min HS curls 45lb 2x12 Leg Ext 15lb 2x10 Standing reaching outside BOS, then standing marches 2x10 CGA  S2S 2x10 Side step at mat table  Alt 4in box taps 2x5 CGA  Leg press 40lb 3x12  11/27/22 NuStep L5 x 6 min Checked Goals Leg press 40lb 2x12 4in step ups x5 each 1 rail  815/24 NuStep L5x  HS curls 35# 2x15 Leg ext 10 2x15 Leg press 30# 2x12, heel raises 30lb 2x15 STS 2x10 Side step at mat table  Alt 4 in box taps CGA to SBA 2x5  11/20/22 NuStep L5x  3# marching 20 reps alt x2 3# hip abd 2x10 STS 2x10 Leg ext 10 2x15 HS curls 35# 2x15 Leg press 30# 2x12 In RW 4" box taps     PATIENT EDUCATION:  Education details: POC/HEP Person educated: Patient Education method: Programmer, multimedia, Facilities manager, Verbal cues, and Handouts Education comprehension: verbalized understanding  HOME EXERCISE PROGRAM: Access Code: [email or web address] URL: [email or web address] Date: 10/18/2022 Prepared by: Stacie Glaze  Exercises - Standing Hip Abduction with Counter Support  -  1 x daily - 7 x weekly - 2 sets - 10 reps - 3 hold - Standing March with Counter Support  - 1 x daily - 7 x weekly - 2 sets - 10 reps - 3 hold - Heel Raises with Counter Support  - 1 x daily - 7 x weekly - 2 sets - 10 reps - 3 hold  ASSESSMENT:  CLINICAL  IMPRESSION: Patient is a 82 y.o. male who was seen today for physical therapy treatment for poor gait and repeated falls. Patient reports he feels like he is doing a whole lot better, despite fall last week. He had a few occasions of his legs to pushing against table with sit to stands. Close CGA needed with resisted gait intervention. Pt unable to control the eccentric phase of backwards walking with resisted gait.    OBJECTIVE IMPAIRMENTS: Abnormal gait, cardiopulmonary status limiting activity, decreased activity tolerance, decreased balance, decreased endurance, decreased mobility, difficulty walking, decreased ROM, decreased strength, increased muscle spasms, impaired flexibility, and pain.   REHAB POTENTIAL: Good  CLINICAL DECISION MAKING: Stable/uncomplicated  EVALUATION COMPLEXITY: Low   GOALS: Goals reviewed with patient? Yes  SHORT TERM GOALS: Target date: 11/01/22 Independent with initial HEP Goal status: Met 11/01/22  LONG TERM GOALS: Target date: 01/18/23  Independent with advanced HEP Goal status: INITIAL  2.  Increase Berg balance test score to 40/56 Goal status: 29 Progression  11/27/22  3.  Decrease TUG time to 19 seconds Goal status: 14.07 Met 11/27/22 w/ RW  4.  Increase left shoulder IR to 60 degrees Goal status: 25 deg ongoing 11/27/22  5.  Increase LE strength to 4/5 Goal status: Met 11/27/42  PLAN:  PT FREQUENCY: 1-2x/week  PT DURATION: 12 weeks  PLANNED INTERVENTIONS: Therapeutic exercises, Therapeutic activity, Neuromuscular re-education, Balance training, Gait training, Patient/Family education, Self Care, Joint mobilization, Stair training, and Manual therapy  PLAN FOR NEXT SESSION: balance and LE strengthening, conitnue to look at and try to help left shoulder function   Grayce Sessions, PTA 12/25/2022, 11:49 AM

## 2022-12-26 NOTE — Therapy (Signed)
OUTPATIENT PHYSICAL THERAPY LOWER EXTREMITY TREATMENT    Patient Name: Harold Pittman MRN: [id number] DOB:06-15-1940, 82 y.o., male Today's Date: 12/27/2022  END OF SESSION:  PT End of Session - 12/27/22 1143     Visit Number 16    Date for PT Re-Evaluation 01/18/23    PT Start Time 1145    PT Stop Time 1230    PT Time Calculation (min) 45 min    Activity Tolerance Patient tolerated treatment well    Behavior During Therapy WFL for tasks assessed/performed                   Past Medical History:  Diagnosis Date   Anemia    low iron   Anxiety state, unspecified    Blood in stool    Chronic airway obstruction, not elsewhere classified    Coronary artery disease    Coronary atherosclerosis of artery bypass graft    Dyspnea    ED (erectile dysfunction)    Esophageal reflux    Gout, unspecified    Osteoarthrosis, unspecified whether generalized or localized, unspecified site    Other and unspecified hyperlipidemia    Other specified disorder of rectum and anus    Personal history of tobacco use, presenting hazards to health    Sleep apnea    does not use cpap   Type II or unspecified type diabetes mellitus without mention of complication, not stated as uncontrolled    Unspecified essential hypertension    Past Surgical History:  Procedure Laterality Date   BIOPSY  09/26/2017   Procedure: BIOPSY;  Surgeon: Meryl Dare, MD;  Location: [facility] ENDOSCOPY;  Service: Endoscopy;;   BIOPSY  02/03/2018   Procedure: BIOPSY;  Surgeon: Lemar Lofty., MD;  Location: [facility] ENDOSCOPY;  Service: Gastroenterology;;   COLONOSCOPY WITH PROPOFOL N/A 09/26/2017   Procedure: COLONOSCOPY WITH PROPOFOL;  Surgeon: Meryl Dare, MD;  Location: [facility] ENDOSCOPY;  Service: Endoscopy;  Laterality: N/A;   CORONARY ARTERY BYPASS GRAFT  97   LIMA to LAD, sequential saphenous vein graft to the first and second diagonal, sequential saphenous vein graft to the intermediate OM and circumflex  and SVG to RCA   ESOPHAGOGASTRODUODENOSCOPY (EGD) WITH PROPOFOL N/A 09/26/2017   Procedure: ESOPHAGOGASTRODUODENOSCOPY (EGD) WITH PROPOFOL;  Surgeon: Meryl Dare, MD;  Location: [facility] ENDOSCOPY;  Service: Endoscopy;  Laterality: N/A;   ESOPHAGOGASTRODUODENOSCOPY (EGD) WITH PROPOFOL N/A 02/03/2018   Procedure: ESOPHAGOGASTRODUODENOSCOPY (EGD);  Surgeon: Lemar Lofty., MD;  Location: [facility] ENDOSCOPY;  Service: Gastroenterology;  Laterality: N/A;   KNEE SURGERY     BILATERAL   POLYPECTOMY  09/26/2017   Procedure: POLYPECTOMY;  Surgeon: Meryl Dare, MD;  Location: [facility] ENDOSCOPY;  Service: Endoscopy;;   ROTATOR CUFF REPAIR     SUBMUCOSAL INJECTION  02/03/2018   Procedure: SUBMUCOSAL INJECTION;  Surgeon: Lemar Lofty., MD;  Location: [facility] ENDOSCOPY;  Service: Gastroenterology;;   Patient Active Problem List   Diagnosis Date Noted   Acute gouty arthritis 03/28/2022   Gout flare 01/28/2022   Peripheral neuropathy 01/15/2022   Ataxia 01/15/2022   Type 2 diabetes mellitus (A1c 6.0 on 05/03/2021) with steroid-induced hyperglycemia 05/16/2021   CKD (chronic kidney disease) stage 4, GFR 15-29 ml/min (HCC) 05/15/2021   Olecranon bursitis, right elbow 05/15/2021   Tremor 05/15/2021   Elbow pain, left 12/05/2020   Constipation 12/05/2020   Atherosclerosis of aorta (HCC) 12/05/2020   Chest pain, atypical 12/05/2020   CTS (carpal tunnel syndrome) 03/24/2020   Grief at  loss of child 03/24/2020   Cholelithiasis 09/16/2019   Nausea 09/02/2019   Weight loss 09/02/2019   Elevated troponin 01/12/2019   Hypertensive urgency 01/10/2019   Well adult exam 10/20/2018   Balanitis 10/20/2018   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/27/2017   Adenoma of stomach 12/25/2017   Abnormal findings on esophagogastroduodenoscopy (EGD) 12/25/2017   Sebaceous cyst 10/04/2017   Iron deficiency anemia    Benign neoplasm of sigmoid colon    Benign neoplasm of cecum    Benign neoplasm of  transverse colon    Positive occult stool blood test 09/24/2017   (HFpEF) heart failure with preserved ejection fraction (HCC) 09/24/2017   Hypokalemia 08/17/2015   Generalized weakness 07/22/2015   Gait disorder 03/18/2014   Low back pain 01/12/2014   History of colon polyps 12/17/2013   DM (diabetes mellitus), type 2 with peripheral vascular complications (HCC) 09/16/2013   Diarrhea 10/08/2012   Edema 10/08/2011   DOE (dyspnea on exertion) 06/29/2011   LATERAL EPICONDYLITIS, LEFT 02/20/2010   TOBACCO USE, QUIT 02/23/2009   Cough 02/16/2009   Hearing loss 10/15/2008   ERECTILE DYSFUNCTION 04/30/2008   Coronary artery disease involving native coronary artery of native heart without angina pectoris 04/30/2008   COPD (chronic obstructive pulmonary disease) (HCC) 05/21/2007   Anxiety state 05/20/2007   Insomnia 03/04/2007   Hyperlipidemia LDL goal <70 02/01/2007   Gout attack 02/01/2007   Essential hypertension 02/01/2007   GERD 02/01/2007   Osteoarthritis 02/01/2007    PCP: Plotnikov, MD  REFERRING PROVIDER: Plotnikov, MD  REFERRING DIAG: Ataxic gait  THERAPY DIAG:  Muscle weakness (generalized)  Difficulty in walking, not elsewhere classified  Repeated falls  Other abnormalities of gait and mobility  Rationale for Evaluation and Treatment: Rehabilitation  ONSET DATE: 10/15/22  SUBJECTIVE:   SUBJECTIVE STATEMENT: No more falls, after the one last Sunday. I fell while turning, my nephew came and helped me.   PERTINENT HISTORY: See above PAIN:  Are you having pain? Yes: NPRS scale: 0/10 Pain location: left shoulder upper arm Pain description: sharp Aggravating factors: reaching back to get wallet 6/10 Relieving factors: rest not moving no pain  PRECAUTIONS: Fall  RED FLAGS: None   WEIGHT BEARING RESTRICTIONS: No  FALLS:  Has patient fallen in last 6 months? Yes. Number of falls 2  LIVING ENVIRONMENT: Lives with: lives alone Lives in:  House/apartment Stairs: No Has following equipment at home: Single point cane, Environmental consultant - 4 wheeled, Marine scientist  OCCUPATION: retired Psychologist, occupational  PLOF: Independent  PATIENT GOALS: walk better, have better balance, reach wallet  NEXT MD VISIT: none  OBJECTIVE:   DIAGNOSTIC FINDINGS: none  COGNITION: Overall cognitive status: Within functional limits for tasks assessed     SENSATION: WFL  POSTURE: rounded shoulders and forward head  PALPATION: Non tender  LOWER EXTREMITY ROM: WFL's except ankle DF was unable to get to neutral and had very poor control of lateral motions  UPPER EXTREMITY ROM:  left shoulder flexion 140, abduction 130, ER 60, IR 20 degrees with IR painful  SHOULDER MMT:  flexion/abduction 3/5, ER 3+/5, IR 3+/5 pain   LOWER EXTREMITY MMT:  MMT Right eval Left eval Right 11/27/22 Left 11/27/22  Hip flexion 4- 4- 4 4+  Hip extension 4- 4- 4 4  Hip abduction 3+ 3+ 4+ 4+  Hip adduction      Hip internal rotation      Hip external rotation      Knee flexion 4- 4- 4 4  Knee extension 4- 4- 4+ 4+  Ankle dorsiflexion 1 1    Ankle plantarflexion 2 2    Ankle inversion 3+ 3+    Ankle eversion 3+ 3+     (Blank rows = not tested) FUNCTIONAL TESTS:  5 times sit to stand: 50 seconds had to use hands after multiple attmepts Timed up and go (TUG): 32 seconds with holding wall Berg Balance Scale: 27/56  GAIT: Distance walked: 100 feet Assistive device utilized: Single point cane Level of assistance: SBA Comments: slow, tends to use walls and furniture   TODAY'S TREATMENT:                                                                                                                              DATE 12/27/22 NuStep L5 x61mins Leg ext 10# 3x12 HS curls 35# 3x12 3# hip abd and ext 2x10 3# marching 2x10 3# calf raises 2x10 STS 2x8 Straight arms pulls 10# 2x10    12/25/22 NuStep L5 x7 min 20 lb resisted Gait forward int resistance x3 CGA, attempted  backwards walking x1 stopped due to pt inability to control eccentric phase Leg Curls 35lb 2x12 Leg Ext 10lb 2x12 6in step ups 1 rail x 10 each   12/18/22 NuStep L5 x53mins  Leg ext 15# 3x10 HS curls 35# 3x10 S2S OHP yellow ball 2x10 Alt 4 in box taps  Standing ball catch  Side steps along mat CGA Shoulder Ext green 2x10  12/06/22 NuStep L5 x14mins  Standing march & abd 3lb cuff 2x10 in RW Calf raises holding RW 2x12 STS 2x10 Side steps along mat CGA Leg ext 15# 3x10 HS curls 35# 3x10  12/04/22 NuStep L5 x50mins  Leg ext 15# 2x12 HS curls 35# 2x12 Ankle 4 way with yellow band x10 STS 2x10 Alt box taps 4" CGA Step ups 4" holding on to railings  Leg press 40# 2x12     11/29/22 NuStep L5 x 6 min HS curls 45lb 2x12 Leg Ext 15lb 2x10 Standing reaching outside BOS, then standing marches 2x10 CGA  S2S 2x10 Side step at mat table  Alt 4in box taps 2x5 CGA  Leg press 40lb 3x12  11/27/22 NuStep L5 x 6 min Checked Goals Leg press 40lb 2x12 4in step ups x5 each 1 rail  815/24 NuStep L5x  HS curls 35# 2x15 Leg ext 10 2x15 Leg press 30# 2x12, heel raises 30lb 2x15 STS 2x10 Side step at mat table  Alt 4 in box taps CGA to SBA 2x5  11/20/22 NuStep L5x  3# marching 20 reps alt x2 3# hip abd 2x10 STS 2x10 Leg ext 10 2x15 HS curls 35# 2x15 Leg press 30# 2x12 In RW 4" box taps     PATIENT EDUCATION:  Education details: POC/HEP Person educated: Patient Education method: Programmer, multimedia, Facilities manager, Verbal cues, and Handouts Education comprehension: verbalized understanding  HOME EXERCISE PROGRAM: Access Code: [email or web address] URL: [email or web address] Date: 10/18/2022 Prepared by: Stacie Glaze  Exercises - Standing Hip Abduction with Counter Support  - 1 x daily - 7 x weekly - 2 sets - 10 reps - 3 hold - Standing March with Counter Support  - 1 x daily - 7 x weekly - 2 sets - 10 reps - 3 hold - Heel Raises with Counter Support  - 1 x  daily - 7 x weekly - 2 sets - 10 reps - 3 hold  ASSESSMENT:  CLINICAL IMPRESSION: Patient is a 82 y.o. male who was seen today for physical therapy treatment for poor gait and repeated falls. Does well with STS today. Still tends to drag his toes, which puts his at risk for tripping. Weak dorsiflexors.    OBJECTIVE IMPAIRMENTS: Abnormal gait, cardiopulmonary status limiting activity, decreased activity tolerance, decreased balance, decreased endurance, decreased mobility, difficulty walking, decreased ROM, decreased strength, increased muscle spasms, impaired flexibility, and pain.   REHAB POTENTIAL: Good  CLINICAL DECISION MAKING: Stable/uncomplicated  EVALUATION COMPLEXITY: Low   GOALS: Goals reviewed with patient? Yes  SHORT TERM GOALS: Target date: 11/01/22 Independent with initial HEP Goal status: Met 11/01/22  LONG TERM GOALS: Target date: 01/18/23  Independent with advanced HEP Goal status: INITIAL  2.  Increase Berg balance test score to 40/56 Goal status: 29 Progression  11/27/22  3.  Decrease TUG time to 19 seconds Goal status: 14.07 Met 11/27/22 w/ RW  4.  Increase left shoulder IR to 60 degrees Goal status: 25 deg ongoing 11/27/22  5.  Increase LE strength to 4/5 Goal status: Met 11/27/42  PLAN:  PT FREQUENCY: 1-2x/week  PT DURATION: 12 weeks  PLANNED INTERVENTIONS: Therapeutic exercises, Therapeutic activity, Neuromuscular re-education, Balance training, Gait training, Patient/Family education, Self Care, Joint mobilization, Stair training, and Manual therapy  PLAN FOR NEXT SESSION: balance and LE strengthening, can try step ups on airex in bars    [employer], PT 12/27/2022, 12:30 PM

## 2022-12-27 ENCOUNTER — Other Ambulatory Visit: Payer: Self-pay | Admitting: Internal Medicine

## 2022-12-27 ENCOUNTER — Ambulatory Visit: Payer: Medicare Other

## 2022-12-27 DIAGNOSIS — R262 Difficulty in walking, not elsewhere classified: Secondary | ICD-10-CM | POA: Diagnosis not present

## 2022-12-27 DIAGNOSIS — M6281 Muscle weakness (generalized): Secondary | ICD-10-CM | POA: Diagnosis not present

## 2022-12-27 DIAGNOSIS — R296 Repeated falls: Secondary | ICD-10-CM

## 2022-12-27 DIAGNOSIS — R2689 Other abnormalities of gait and mobility: Secondary | ICD-10-CM

## 2023-01-01 ENCOUNTER — Ambulatory Visit: Payer: Medicare Other | Admitting: Physical Therapy

## 2023-01-01 ENCOUNTER — Encounter: Payer: Self-pay | Admitting: Physical Therapy

## 2023-01-01 DIAGNOSIS — R296 Repeated falls: Secondary | ICD-10-CM

## 2023-01-01 DIAGNOSIS — M6281 Muscle weakness (generalized): Secondary | ICD-10-CM | POA: Diagnosis not present

## 2023-01-01 DIAGNOSIS — R262 Difficulty in walking, not elsewhere classified: Secondary | ICD-10-CM

## 2023-01-01 DIAGNOSIS — R2689 Other abnormalities of gait and mobility: Secondary | ICD-10-CM | POA: Diagnosis not present

## 2023-01-01 NOTE — Therapy (Signed)
OUTPATIENT PHYSICAL THERAPY LOWER EXTREMITY TREATMENT    Patient Name: Harold Pittman MRN: [id number] DOB:[birth year], 82 y.o., male Today's Date: 01/01/2023  END OF SESSION:  PT End of Session - 01/01/23 1146     Visit Number 17    Date for PT Re-Evaluation 01/18/23    PT Start Time 1145    PT Stop Time 1230    PT Time Calculation (min) 45 min    Activity Tolerance Patient tolerated treatment well    Behavior During Therapy WFL for tasks assessed/performed                   Past Medical History:  Diagnosis Date   Anemia    low iron   Anxiety state, unspecified    Blood in stool    Chronic airway obstruction, not elsewhere classified    Coronary artery disease    Coronary atherosclerosis of artery bypass graft    Dyspnea    ED (erectile dysfunction)    Esophageal reflux    Gout, unspecified    Osteoarthrosis, unspecified whether generalized or localized, unspecified site    Other and unspecified hyperlipidemia    Other specified disorder of rectum and anus    Personal history of tobacco use, presenting hazards to health    Sleep apnea    does not use cpap   Type II or unspecified type diabetes mellitus without mention of complication, not stated as uncontrolled    Unspecified essential hypertension    Past Surgical History:  Procedure Laterality Date   BIOPSY  09/26/2017   Procedure: BIOPSY;  Surgeon: Meryl Dare, MD;  Location: [facility] ENDOSCOPY;  Service: Endoscopy;;   BIOPSY  02/03/2018   Procedure: BIOPSY;  Surgeon: Lemar Lofty., MD;  Location: [facility] ENDOSCOPY;  Service: Gastroenterology;;   COLONOSCOPY WITH PROPOFOL N/A 09/26/2017   Procedure: COLONOSCOPY WITH PROPOFOL;  Surgeon: Meryl Dare, MD;  Location: [facility] ENDOSCOPY;  Service: Endoscopy;  Laterality: N/A;   CORONARY ARTERY BYPASS GRAFT  97   LIMA to LAD, sequential saphenous vein graft to the first and second diagonal, sequential saphenous vein graft to the intermediate OM and circumflex  and SVG to RCA   ESOPHAGOGASTRODUODENOSCOPY (EGD) WITH PROPOFOL N/A 09/26/2017   Procedure: ESOPHAGOGASTRODUODENOSCOPY (EGD) WITH PROPOFOL;  Surgeon: Meryl Dare, MD;  Location: [facility] ENDOSCOPY;  Service: Endoscopy;  Laterality: N/A;   ESOPHAGOGASTRODUODENOSCOPY (EGD) WITH PROPOFOL N/A 02/03/2018   Procedure: ESOPHAGOGASTRODUODENOSCOPY (EGD);  Surgeon: Lemar Lofty., MD;  Location: [facility] ENDOSCOPY;  Service: Gastroenterology;  Laterality: N/A;   KNEE SURGERY     BILATERAL   POLYPECTOMY  09/26/2017   Procedure: POLYPECTOMY;  Surgeon: Meryl Dare, MD;  Location: [facility] ENDOSCOPY;  Service: Endoscopy;;   ROTATOR CUFF REPAIR     SUBMUCOSAL INJECTION  02/03/2018   Procedure: SUBMUCOSAL INJECTION;  Surgeon: Lemar Lofty., MD;  Location: [facility] ENDOSCOPY;  Service: Gastroenterology;;   Patient Active Problem List   Diagnosis Date Noted   Acute gouty arthritis 03/28/2022   Gout flare 01/28/2022   Peripheral neuropathy 01/15/2022   Ataxia 01/15/2022   Type 2 diabetes mellitus (A1c 6.0 on 05/03/2021) with steroid-induced hyperglycemia 05/16/2021   CKD (chronic kidney disease) stage 4, GFR 15-29 ml/min (HCC) 05/15/2021   Olecranon bursitis, right elbow 05/15/2021   Tremor 05/15/2021   Elbow pain, left 12/05/2020   Constipation 12/05/2020   Atherosclerosis of aorta (HCC) 12/05/2020   Chest pain, atypical 12/05/2020   CTS (carpal tunnel syndrome) 03/24/2020   Grief at  loss of child 03/24/2020   Cholelithiasis 09/16/2019   Nausea 09/02/2019   Weight loss 09/02/2019   Elevated troponin 01/12/2019   Hypertensive urgency 01/10/2019   Well adult exam 10/20/2018   Balanitis 10/20/2018   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/27/2017   Adenoma of stomach 12/25/2017   Abnormal findings on esophagogastroduodenoscopy (EGD) 12/25/2017   Sebaceous cyst 10/04/2017   Iron deficiency anemia    Benign neoplasm of sigmoid colon    Benign neoplasm of cecum    Benign neoplasm of  transverse colon    Positive occult stool blood test 09/24/2017   (HFpEF) heart failure with preserved ejection fraction (HCC) 09/24/2017   Hypokalemia 08/17/2015   Generalized weakness 07/22/2015   Gait disorder 03/18/2014   Low back pain 01/12/2014   History of colon polyps 12/17/2013   DM (diabetes mellitus), type 2 with peripheral vascular complications (HCC) 09/16/2013   Diarrhea 10/08/2012   Edema 10/08/2011   DOE (dyspnea on exertion) 06/29/2011   LATERAL EPICONDYLITIS, LEFT 02/20/2010   TOBACCO USE, QUIT 02/23/2009   Cough 02/16/2009   Hearing loss 10/15/2008   ERECTILE DYSFUNCTION 04/30/2008   Coronary artery disease involving native coronary artery of native heart without angina pectoris 04/30/2008   COPD (chronic obstructive pulmonary disease) (HCC) 05/21/2007   Anxiety state 05/20/2007   Insomnia 03/04/2007   Hyperlipidemia LDL goal <70 02/01/2007   Gout attack 02/01/2007   Essential hypertension 02/01/2007   GERD 02/01/2007   Osteoarthritis 02/01/2007    PCP: Posey Rea, MD  REFERRING PROVIDER: Plotnikov, MD  REFERRING DIAG: Ataxic gait  THERAPY DIAG:  Muscle weakness (generalized)  Difficulty in walking, not elsewhere classified  Repeated falls  Rationale for Evaluation and Treatment: Rehabilitation  ONSET DATE: 10/15/22  SUBJECTIVE:   SUBJECTIVE STATEMENT: "Im fine "  PERTINENT HISTORY: See above PAIN:  Are you having pain? Yes: NPRS scale: 0/10 Pain location: left shoulder upper arm Pain description: sharp Aggravating factors: reaching back to get wallet 6/10 Relieving factors: rest not moving no pain  PRECAUTIONS: Fall  RED FLAGS: None   WEIGHT BEARING RESTRICTIONS: No  FALLS:  Has patient fallen in last 6 months? Yes. Number of falls 2  LIVING ENVIRONMENT: Lives with: lives alone Lives in: House/apartment Stairs: No Has following equipment at home: Single point cane, Environmental consultant - 4 wheeled, Marine scientist  OCCUPATION: retired  Psychologist, occupational  PLOF: Independent  PATIENT GOALS: walk better, have better balance, reach wallet  NEXT MD VISIT: none  OBJECTIVE:   DIAGNOSTIC FINDINGS: none  COGNITION: Overall cognitive status: Within functional limits for tasks assessed     SENSATION: WFL  POSTURE: rounded shoulders and forward head  PALPATION: Non tender  LOWER EXTREMITY ROM: WFL's except ankle DF was unable to get to neutral and had very poor control of lateral motions  UPPER EXTREMITY ROM:  left shoulder flexion 140, abduction 130, ER 60, IR 20 degrees with IR painful  SHOULDER MMT:  flexion/abduction 3/5, ER 3+/5, IR 3+/5 pain   LOWER EXTREMITY MMT:  MMT Right eval Left eval Right 11/27/22 Left 11/27/22  Hip flexion 4- 4- 4 4+  Hip extension 4- 4- 4 4  Hip abduction 3+ 3+ 4+ 4+  Hip adduction      Hip internal rotation      Hip external rotation      Knee flexion 4- 4- 4 4  Knee extension 4- 4- 4+ 4+  Ankle dorsiflexion 1 1    Ankle plantarflexion 2 2    Ankle  inversion 3+ 3+    Ankle eversion 3+ 3+     (Blank rows = not tested) FUNCTIONAL TESTS:  5 times sit to stand: 50 seconds had to use hands after multiple attmepts Timed up and go (TUG): 32 seconds with holding wall Berg Balance Scale: 27/56  GAIT: Distance walked: 100 feet Assistive device utilized: Single point cane Level of assistance: SBA Comments: slow, tends to use walls and furniture   TODAY'S TREATMENT:                                                                                                                              DATE 01/01/23 Bike L 2.5 x 7 min Resisted gait 10lb forward and side steps x3 each  HS curls 45lb 2x10 Leg Ext 15lb 2x15 6in step ups 1 rail x 10 each S2S 2x10  12/27/22 NuStep L5 x58mins Leg ext 10# 3x12 HS curls 35# 3x12 3# hip abd and ext 2x10 3# marching 2x10 3# calf raises 2x10 STS 2x8 Straight arms pulls 10# 2x10    12/25/22 NuStep L5 x7 min 20 lb resisted Gait forward int  resistance x3 CGA, attempted backwards walking x1 stopped due to pt inability to control eccentric phase Leg Curls 35lb 2x12 Leg Ext 10lb 2x12 6in step ups 1 rail x 10 each   12/18/22 NuStep L5 x51mins  Leg ext 15# 3x10 HS curls 35# 3x10 S2S OHP yellow ball 2x10 Alt 4 in box taps  Standing ball catch  Side steps along mat CGA Shoulder Ext green 2x10  12/06/22 NuStep L5 x53mins  Standing march & abd 3lb cuff 2x10 in RW Calf raises holding RW 2x12 STS 2x10 Side steps along mat CGA Leg ext 15# 3x10 HS curls 35# 3x10  12/04/22 NuStep L5 x49mins  Leg ext 15# 2x12 HS curls 35# 2x12 Ankle 4 way with yellow band x10 STS 2x10 Alt box taps 4" CGA Step ups 4" holding on to railings  Leg press 40# 2x12     11/29/22 NuStep L5 x 6 min HS curls 45lb 2x12 Leg Ext 15lb 2x10 Standing reaching outside BOS, then standing marches 2x10 CGA  S2S 2x10 Side step at mat table  Alt 4in box taps 2x5 CGA  Leg press 40lb 3x12  11/27/22 NuStep L5 x 6 min Checked Goals Leg press 40lb 2x12 4in step ups x5 each 1 rail  815/24 NuStep L5x  HS curls 35# 2x15 Leg ext 10 2x15 Leg press 30# 2x12, heel raises 30lb 2x15 STS 2x10 Side step at mat table  Alt 4 in box taps CGA to SBA 2x5  11/20/22 NuStep L5x  3# marching 20 reps alt x2 3# hip abd 2x10 STS 2x10 Leg ext 10 2x15 HS curls 35# 2x15 Leg press 30# 2x12 In RW 4" box taps     PATIENT EDUCATION:  Education details: POC/HEP Person educated: Patient Education method: Programmer, multimedia, Facilities manager, Verbal cues, and Handouts Education comprehension: verbalized understanding  HOME EXERCISE PROGRAM: Access Code: [email or web address] URL: [email or web address] Date: 10/18/2022 Prepared by: Stacie Glaze  Exercises - Standing Hip Abduction with Counter Support  - 1 x daily - 7 x weekly - 2 sets - 10 reps - 3 hold - Standing March with Counter Support  - 1 x daily - 7 x weekly - 2 sets - 10 reps - 3 hold - Heel Raises  with Counter Support  - 1 x daily - 7 x weekly - 2 sets - 10 reps - 3 hold  ASSESSMENT:  CLINICAL IMPRESSION: Patient is a 82 y.o. male who was seen today for physical therapy treatment for poor gait and repeated falls. Does well with resisted gait today requiring CGA for security.  Still tends to drag his toes with gait and holds RW too far out front, which puts his at risk for tripping.   OBJECTIVE IMPAIRMENTS: Abnormal gait, cardiopulmonary status limiting activity, decreased activity tolerance, decreased balance, decreased endurance, decreased mobility, difficulty walking, decreased ROM, decreased strength, increased muscle spasms, impaired flexibility, and pain.   REHAB POTENTIAL: Good  CLINICAL DECISION MAKING: Stable/uncomplicated  EVALUATION COMPLEXITY: Low   GOALS: Goals reviewed with patient? Yes  SHORT TERM GOALS: Target date: 11/01/22 Independent with initial HEP Goal status: Met 11/01/22  LONG TERM GOALS: Target date: 01/18/23  Independent with advanced HEP Goal status: INITIAL  2.  Increase Berg balance test score to 40/56 Goal status: 29 Progression  11/27/22  3.  Decrease TUG time to 19 seconds Goal status: 14.07 Met 11/27/22 w/ RW  4.  Increase left shoulder IR to 60 degrees Goal status: 25 deg ongoing 11/27/22  5.  Increase LE strength to 4/5 Goal status: Met 11/27/42  PLAN:  PT FREQUENCY: 1-2x/week  PT DURATION: 12 weeks  PLANNED INTERVENTIONS: Therapeutic exercises, Therapeutic activity, Neuromuscular re-education, Balance training, Gait training, Patient/Family education, Self Care, Joint mobilization, Stair training, and Manual therapy  PLAN FOR NEXT SESSION: balance and LE strengthening, can try step ups on airex in bars    Grayce Sessions, PTA 01/01/2023, 11:47 AM

## 2023-01-02 ENCOUNTER — Telehealth: Payer: Self-pay | Admitting: Hematology

## 2023-01-02 NOTE — Telephone Encounter (Signed)
Unable to leave a message regarding scheduled appointment times/date

## 2023-01-03 ENCOUNTER — Encounter: Payer: Self-pay | Admitting: Physical Therapy

## 2023-01-03 ENCOUNTER — Ambulatory Visit: Payer: Medicare Other | Admitting: Physical Therapy

## 2023-01-03 DIAGNOSIS — R2689 Other abnormalities of gait and mobility: Secondary | ICD-10-CM | POA: Diagnosis not present

## 2023-01-03 DIAGNOSIS — M6281 Muscle weakness (generalized): Secondary | ICD-10-CM

## 2023-01-03 DIAGNOSIS — R296 Repeated falls: Secondary | ICD-10-CM

## 2023-01-03 DIAGNOSIS — R262 Difficulty in walking, not elsewhere classified: Secondary | ICD-10-CM

## 2023-01-03 NOTE — Therapy (Signed)
OUTPATIENT PHYSICAL THERAPY LOWER EXTREMITY TREATMENT    Patient Name: Harold Pittman MRN: [id number] DOB:[birth year], 82 y.o., male Today's Date: 01/03/2023  END OF SESSION:  PT End of Session - 01/03/23 1143     Visit Number 18    Date for PT Re-Evaluation 01/18/23    PT Start Time 1145    PT Stop Time 1230    PT Time Calculation (min) 45 min    Activity Tolerance Patient tolerated treatment well    Behavior During Therapy WFL for tasks assessed/performed                   Past Medical History:  Diagnosis Date   Anemia    low iron   Anxiety state, unspecified    Blood in stool    Chronic airway obstruction, not elsewhere classified    Coronary artery disease    Coronary atherosclerosis of artery bypass graft    Dyspnea    ED (erectile dysfunction)    Esophageal reflux    Gout, unspecified    Osteoarthrosis, unspecified whether generalized or localized, unspecified site    Other and unspecified hyperlipidemia    Other specified disorder of rectum and anus    Personal history of tobacco use, presenting hazards to health    Sleep apnea    does not use cpap   Type II or unspecified type diabetes mellitus without mention of complication, not stated as uncontrolled    Unspecified essential hypertension    Past Surgical History:  Procedure Laterality Date   BIOPSY  09/26/2017   Procedure: BIOPSY;  Surgeon: Meryl Dare, MD;  Location: [facility] ENDOSCOPY;  Service: Endoscopy;;   BIOPSY  02/03/2018   Procedure: BIOPSY;  Surgeon: Lemar Lofty., MD;  Location: [facility] ENDOSCOPY;  Service: Gastroenterology;;   COLONOSCOPY WITH PROPOFOL N/A 09/26/2017   Procedure: COLONOSCOPY WITH PROPOFOL;  Surgeon: Meryl Dare, MD;  Location: [facility] ENDOSCOPY;  Service: Endoscopy;  Laterality: N/A;   CORONARY ARTERY BYPASS GRAFT  97   LIMA to LAD, sequential saphenous vein graft to the first and second diagonal, sequential saphenous vein graft to the intermediate OM and circumflex  and SVG to RCA   ESOPHAGOGASTRODUODENOSCOPY (EGD) WITH PROPOFOL N/A 09/26/2017   Procedure: ESOPHAGOGASTRODUODENOSCOPY (EGD) WITH PROPOFOL;  Surgeon: Meryl Dare, MD;  Location: [facility] ENDOSCOPY;  Service: Endoscopy;  Laterality: N/A;   ESOPHAGOGASTRODUODENOSCOPY (EGD) WITH PROPOFOL N/A 02/03/2018   Procedure: ESOPHAGOGASTRODUODENOSCOPY (EGD);  Surgeon: Lemar Lofty., MD;  Location: [facility] ENDOSCOPY;  Service: Gastroenterology;  Laterality: N/A;   KNEE SURGERY     BILATERAL   POLYPECTOMY  09/26/2017   Procedure: POLYPECTOMY;  Surgeon: Meryl Dare, MD;  Location: [facility] ENDOSCOPY;  Service: Endoscopy;;   ROTATOR CUFF REPAIR     SUBMUCOSAL INJECTION  02/03/2018   Procedure: SUBMUCOSAL INJECTION;  Surgeon: Lemar Lofty., MD;  Location: [facility] ENDOSCOPY;  Service: Gastroenterology;;   Patient Active Problem List   Diagnosis Date Noted   Acute gouty arthritis 03/28/2022   Gout flare 01/28/2022   Peripheral neuropathy 01/15/2022   Ataxia 01/15/2022   Type 2 diabetes mellitus (A1c 6.0 on 05/03/2021) with steroid-induced hyperglycemia 05/16/2021   CKD (chronic kidney disease) stage 4, GFR 15-29 ml/min (HCC) 05/15/2021   Olecranon bursitis, right elbow 05/15/2021   Tremor 05/15/2021   Elbow pain, left 12/05/2020   Constipation 12/05/2020   Atherosclerosis of aorta (HCC) 12/05/2020   Chest pain, atypical 12/05/2020   CTS (carpal tunnel syndrome) 03/24/2020   Grief at  loss of child 03/24/2020   Cholelithiasis 09/16/2019   Nausea 09/02/2019   Weight loss 09/02/2019   Elevated troponin 01/12/2019   Hypertensive urgency 01/10/2019   Well adult exam 10/20/2018   Balanitis 10/20/2018   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/27/2017   Adenoma of stomach 12/25/2017   Abnormal findings on esophagogastroduodenoscopy (EGD) 12/25/2017   Sebaceous cyst 10/04/2017   Iron deficiency anemia    Benign neoplasm of sigmoid colon    Benign neoplasm of cecum    Benign neoplasm of  transverse colon    Positive occult stool blood test 09/24/2017   (HFpEF) heart failure with preserved ejection fraction (HCC) 09/24/2017   Hypokalemia 08/17/2015   Generalized weakness 07/22/2015   Gait disorder 03/18/2014   Low back pain 01/12/2014   History of colon polyps 12/17/2013   DM (diabetes mellitus), type 2 with peripheral vascular complications (HCC) 09/16/2013   Diarrhea 10/08/2012   Edema 10/08/2011   DOE (dyspnea on exertion) 06/29/2011   LATERAL EPICONDYLITIS, LEFT 02/20/2010   TOBACCO USE, QUIT 02/23/2009   Cough 02/16/2009   Hearing loss 10/15/2008   ERECTILE DYSFUNCTION 04/30/2008   Coronary artery disease involving native coronary artery of native heart without angina pectoris 04/30/2008   COPD (chronic obstructive pulmonary disease) (HCC) 05/21/2007   Anxiety state 05/20/2007   Insomnia 03/04/2007   Hyperlipidemia LDL goal <70 02/01/2007   Gout attack 02/01/2007   Essential hypertension 02/01/2007   GERD 02/01/2007   Osteoarthritis 02/01/2007    PCP: Posey Rea, MD  REFERRING PROVIDER: Plotnikov, MD  REFERRING DIAG: Ataxic gait  THERAPY DIAG:  Muscle weakness (generalized)  Difficulty in walking, not elsewhere classified  Repeated falls  Rationale for Evaluation and Treatment: Rehabilitation  ONSET DATE: 10/15/22  SUBJECTIVE:   SUBJECTIVE STATEMENT: "I feel good"  PERTINENT HISTORY: See above PAIN:  Are you having pain? Yes: NPRS scale: 0/10 Pain location: left shoulder upper arm Pain description: sharp Aggravating factors: reaching back to get wallet 6/10 Relieving factors: rest not moving no pain  PRECAUTIONS: Fall  RED FLAGS: None   WEIGHT BEARING RESTRICTIONS: No  FALLS:  Has patient fallen in last 6 months? Yes. Number of falls 2  LIVING ENVIRONMENT: Lives with: lives alone Lives in: House/apartment Stairs: No Has following equipment at home: Single point cane, Environmental consultant - 4 wheeled, Marine scientist  OCCUPATION: retired  Psychologist, occupational  PLOF: Independent  PATIENT GOALS: walk better, have better balance, reach wallet  NEXT MD VISIT: none  OBJECTIVE:   DIAGNOSTIC FINDINGS: none  COGNITION: Overall cognitive status: Within functional limits for tasks assessed     SENSATION: WFL  POSTURE: rounded shoulders and forward head  PALPATION: Non tender  LOWER EXTREMITY ROM: WFL's except ankle DF was unable to get to neutral and had very poor control of lateral motions  UPPER EXTREMITY ROM:  left shoulder flexion 140, abduction 130, ER 60, IR 20 degrees with IR painful  SHOULDER MMT:  flexion/abduction 3/5, ER 3+/5, IR 3+/5 pain   LOWER EXTREMITY MMT:  MMT Right eval Left eval Right 11/27/22 Left 11/27/22  Hip flexion 4- 4- 4 4+  Hip extension 4- 4- 4 4  Hip abduction 3+ 3+ 4+ 4+  Hip adduction      Hip internal rotation      Hip external rotation      Knee flexion 4- 4- 4 4  Knee extension 4- 4- 4+ 4+  Ankle dorsiflexion 1 1    Ankle plantarflexion 2 2    Ankle  inversion 3+ 3+    Ankle eversion 3+ 3+     (Blank rows = not tested) FUNCTIONAL TESTS:  5 times sit to stand: 50 seconds had to use hands after multiple attmepts Timed up and go (TUG): 32 seconds with holding wall Berg Balance Scale: 27/56  GAIT: Distance walked: 100 feet Assistive device utilized: Single point cane Level of assistance: SBA Comments: slow, tends to use walls and furniture   TODAY'S TREATMENT:                                                                                                                              DATE 01/03/23 Bike L 2.5 x 7 min Resisted gait 10lb forward and side steps x3 each  HS curls 45lb 2x10 Leg Ext 10lb 2x10  6in step ups 1 rail x 10 each Alt 2in box taps 01/01/23 Bike L 2.5 x 7 min Resisted gait 10lb forward and side steps x3 each  HS curls 45lb 2x10 Leg Ext 15lb 2x15 6in step ups 1 rail x 10 each S2S 2x10  12/27/22 NuStep L5 x6mins Leg ext 10# 3x12 HS curls 35# 3x12 3#  hip abd and ext 2x10 3# marching 2x10 3# calf raises 2x10 STS 2x8 Straight arms pulls 10# 2x10    12/25/22 NuStep L5 x7 min 20 lb resisted Gait forward int resistance x3 CGA, attempted backwards walking x1 stopped due to pt inability to control eccentric phase Leg Curls 35lb 2x12 Leg Ext 10lb 2x12 6in step ups 1 rail x 10 each   12/18/22 NuStep L5 x49mins  Leg ext 15# 3x10 HS curls 35# 3x10 S2S OHP yellow ball 2x10 Alt 4 in box taps  Standing ball catch  Side steps along mat CGA Shoulder Ext green 2x10  12/06/22 NuStep L5 x8mins  Standing march & abd 3lb cuff 2x10 in RW Calf raises holding RW 2x12 STS 2x10 Side steps along mat CGA Leg ext 15# 3x10 HS curls 35# 3x10  12/04/22 NuStep L5 x50mins  Leg ext 15# 2x12 HS curls 35# 2x12 Ankle 4 way with yellow band x10 STS 2x10 Alt box taps 4" CGA Step ups 4" holding on to railings  Leg press 40# 2x12     11/29/22 NuStep L5 x 6 min HS curls 45lb 2x12 Leg Ext 15lb 2x10 Standing reaching outside BOS, then standing marches 2x10 CGA  S2S 2x10 Side step at mat table  Alt 4in box taps 2x5 CGA  Leg press 40lb 3x12  11/27/22 NuStep L5 x 6 min Checked Goals Leg press 40lb 2x12 4in step ups x5 each 1 rail  815/24 NuStep L5x  HS curls 35# 2x15 Leg ext 10 2x15 Leg press 30# 2x12, heel raises 30lb 2x15 STS 2x10 Side step at mat table  Alt 4 in box taps CGA to SBA 2x5  11/20/22 NuStep L5x  3# marching 20 reps alt x2 3# hip abd 2x10 STS 2x10 Leg ext 10 2x15 HS  curls 35# 2x15 Leg press 30# 2x12 In RW 4" box taps     PATIENT EDUCATION:  Education details: POC/HEP Person educated: Patient Education method: Programmer, multimedia, Facilities manager, Verbal cues, and Handouts Education comprehension: verbalized understanding  HOME EXERCISE PROGRAM: Access Code: [email or web address] URL: [email or web address] Date: 10/18/2022 Prepared by: Stacie Glaze  Exercises - Standing Hip Abduction with Counter Support   - 1 x daily - 7 x weekly - 2 sets - 10 reps - 3 hold - Standing March with Counter Support  - 1 x daily - 7 x weekly - 2 sets - 10 reps - 3 hold - Heel Raises with Counter Support  - 1 x daily - 7 x weekly - 2 sets - 10 reps - 3 hold  ASSESSMENT:  CLINICAL IMPRESSION: Patient is a 82 y.o. male who was seen today for physical therapy treatment for poor gait and repeated falls. Again did well with resisted gait today requiring CGA for security.  Still tends to drag his toes with gait and holds RW too far out front, which puts his at risk for tripping. Cue for ROM needed with HS curls and doe RW safety. No pain during session.   OBJECTIVE IMPAIRMENTS: Abnormal gait, cardiopulmonary status limiting activity, decreased activity tolerance, decreased balance, decreased endurance, decreased mobility, difficulty walking, decreased ROM, decreased strength, increased muscle spasms, impaired flexibility, and pain.   REHAB POTENTIAL: Good  CLINICAL DECISION MAKING: Stable/uncomplicated  EVALUATION COMPLEXITY: Low   GOALS: Goals reviewed with patient? Yes  SHORT TERM GOALS: Target date: 11/01/22 Independent with initial HEP Goal status: Met 11/01/22  LONG TERM GOALS: Target date: 01/18/23  Independent with advanced HEP Goal status: INITIAL  2.  Increase Berg balance test score to 40/56 Goal status: 31 Progression  01/03/23  3.  Decrease TUG time to 19 seconds Goal status: 14.07 Met 11/27/22 w/ RW  4.  Increase left shoulder IR to 60 degrees Goal status: 33 deg ongoing 01/03/23  5.  Increase LE strength to 4/5 Goal status: Met 11/27/42  PLAN:  PT FREQUENCY: 1-2x/week  PT DURATION: 12 weeks  PLANNED INTERVENTIONS: Therapeutic exercises, Therapeutic activity, Neuromuscular re-education, Balance training, Gait training, Patient/Family education, Self Care, Joint mobilization, Stair training, and Manual therapy  PLAN FOR NEXT SESSION: balance and LE strengthening, can try step ups on airex  in bars    Grayce Sessions, PTA 01/03/2023, 11:44 AM

## 2023-01-08 ENCOUNTER — Encounter: Payer: Self-pay | Admitting: Physical Therapy

## 2023-01-08 ENCOUNTER — Ambulatory Visit: Payer: Medicare Other | Attending: Family Medicine | Admitting: Physical Therapy

## 2023-01-08 DIAGNOSIS — R262 Difficulty in walking, not elsewhere classified: Secondary | ICD-10-CM

## 2023-01-08 DIAGNOSIS — R296 Repeated falls: Secondary | ICD-10-CM

## 2023-01-08 DIAGNOSIS — M6281 Muscle weakness (generalized): Secondary | ICD-10-CM | POA: Diagnosis not present

## 2023-01-08 NOTE — Therapy (Signed)
OUTPATIENT PHYSICAL THERAPY LOWER EXTREMITY TREATMENT    Patient Name: Harold Pittman MRN: [id number] DOB:1940-06-16, 82 y.o., male Today's Date: 01/08/2023  END OF SESSION:  PT End of Session - 01/08/23 1147     Visit Number 19    Date for PT Re-Evaluation 01/18/23    PT Start Time 1147    PT Stop Time 1230    PT Time Calculation (min) 43 min    Activity Tolerance Patient tolerated treatment well    Behavior During Therapy WFL for tasks assessed/performed                   Past Medical History:  Diagnosis Date   Anemia    low iron   Anxiety state, unspecified    Blood in stool    Chronic airway obstruction, not elsewhere classified    Coronary artery disease    Coronary atherosclerosis of artery bypass graft    Dyspnea    ED (erectile dysfunction)    Esophageal reflux    Gout, unspecified    Osteoarthrosis, unspecified whether generalized or localized, unspecified site    Other and unspecified hyperlipidemia    Other specified disorder of rectum and anus    Personal history of tobacco use, presenting hazards to health    Sleep apnea    does not use cpap   Type II or unspecified type diabetes mellitus without mention of complication, not stated as uncontrolled    Unspecified essential hypertension    Past Surgical History:  Procedure Laterality Date   BIOPSY  09/26/2017   Procedure: BIOPSY;  Surgeon: Meryl Dare, MD;  Location: [facility] ENDOSCOPY;  Service: Endoscopy;;   BIOPSY  02/03/2018   Procedure: BIOPSY;  Surgeon: Lemar Lofty., MD;  Location: [facility] ENDOSCOPY;  Service: Gastroenterology;;   COLONOSCOPY WITH PROPOFOL N/A 09/26/2017   Procedure: COLONOSCOPY WITH PROPOFOL;  Surgeon: Meryl Dare, MD;  Location: [facility] ENDOSCOPY;  Service: Endoscopy;  Laterality: N/A;   CORONARY ARTERY BYPASS GRAFT  97   LIMA to LAD, sequential saphenous vein graft to the first and second diagonal, sequential saphenous vein graft to the intermediate OM and circumflex  and SVG to RCA   ESOPHAGOGASTRODUODENOSCOPY (EGD) WITH PROPOFOL N/A 09/26/2017   Procedure: ESOPHAGOGASTRODUODENOSCOPY (EGD) WITH PROPOFOL;  Surgeon: Meryl Dare, MD;  Location: [facility] ENDOSCOPY;  Service: Endoscopy;  Laterality: N/A;   ESOPHAGOGASTRODUODENOSCOPY (EGD) WITH PROPOFOL N/A 02/03/2018   Procedure: ESOPHAGOGASTRODUODENOSCOPY (EGD);  Surgeon: Lemar Lofty., MD;  Location: [facility] ENDOSCOPY;  Service: Gastroenterology;  Laterality: N/A;   KNEE SURGERY     BILATERAL   POLYPECTOMY  09/26/2017   Procedure: POLYPECTOMY;  Surgeon: Meryl Dare, MD;  Location: [facility] ENDOSCOPY;  Service: Endoscopy;;   ROTATOR CUFF REPAIR     SUBMUCOSAL INJECTION  02/03/2018   Procedure: SUBMUCOSAL INJECTION;  Surgeon: Lemar Lofty., MD;  Location: [facility] ENDOSCOPY;  Service: Gastroenterology;;   Patient Active Problem List   Diagnosis Date Noted   Acute gouty arthritis 03/28/2022   Gout flare 01/28/2022   Peripheral neuropathy 01/15/2022   Ataxia 01/15/2022   Type 2 diabetes mellitus (A1c 6.0 on 05/03/2021) with steroid-induced hyperglycemia 05/16/2021   CKD (chronic kidney disease) stage 4, GFR 15-29 ml/min (HCC) 05/15/2021   Olecranon bursitis, right elbow 05/15/2021   Tremor 05/15/2021   Elbow pain, left 12/05/2020   Constipation 12/05/2020   Atherosclerosis of aorta (HCC) 12/05/2020   Chest pain, atypical 12/05/2020   CTS (carpal tunnel syndrome) 03/24/2020   Grief at  loss of child 03/24/2020   Cholelithiasis 09/16/2019   Nausea 09/02/2019   Weight loss 09/02/2019   Elevated troponin 01/12/2019   Hypertensive urgency 01/10/2019   Well adult exam 10/20/2018   Balanitis 10/20/2018   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/27/2017   Adenoma of stomach 12/25/2017   Abnormal findings on esophagogastroduodenoscopy (EGD) 12/25/2017   Sebaceous cyst 10/04/2017   Iron deficiency anemia    Benign neoplasm of sigmoid colon    Benign neoplasm of cecum    Benign neoplasm of  transverse colon    Positive occult stool blood test 09/24/2017   (HFpEF) heart failure with preserved ejection fraction (HCC) 09/24/2017   Hypokalemia 08/17/2015   Generalized weakness 07/22/2015   Gait disorder 03/18/2014   Low back pain 01/12/2014   History of colon polyps 12/17/2013   DM (diabetes mellitus), type 2 with peripheral vascular complications (HCC) 09/16/2013   Diarrhea 10/08/2012   Edema 10/08/2011   DOE (dyspnea on exertion) 06/29/2011   LATERAL EPICONDYLITIS, LEFT 02/20/2010   TOBACCO USE, QUIT 02/23/2009   Cough 02/16/2009   Hearing loss 10/15/2008   ERECTILE DYSFUNCTION 04/30/2008   Coronary artery disease involving native coronary artery of native heart without angina pectoris 04/30/2008   COPD (chronic obstructive pulmonary disease) (HCC) 05/21/2007   Anxiety state 05/20/2007   Insomnia 03/04/2007   Hyperlipidemia LDL goal <70 02/01/2007   Gout attack 02/01/2007   Essential hypertension 02/01/2007   GERD 02/01/2007   Osteoarthritis 02/01/2007    PCP: Posey Rea, MD  REFERRING PROVIDER: Plotnikov, MD  REFERRING DIAG: Ataxic gait  THERAPY DIAG:  Muscle weakness (generalized)  Difficulty in walking, not elsewhere classified  Repeated falls  Rationale for Evaluation and Treatment: Rehabilitation  ONSET DATE: 10/15/22  SUBJECTIVE:   SUBJECTIVE STATEMENT: "I feel good right now" PERTINENT HISTORY: See above PAIN:  Are you having pain? Yes: NPRS scale: 0/10 Pain location: left shoulder upper arm Pain description: sharp Aggravating factors: reaching back to get wallet 6/10 Relieving factors: rest not moving no pain  PRECAUTIONS: Fall  RED FLAGS: None   WEIGHT BEARING RESTRICTIONS: No  FALLS:  Has patient fallen in last 6 months? Yes. Number of falls 2  LIVING ENVIRONMENT: Lives with: lives alone Lives in: House/apartment Stairs: No Has following equipment at home: Single point cane, Environmental consultant - 4 wheeled, Marine scientist  OCCUPATION:  retired Psychologist, occupational  PLOF: Independent  PATIENT GOALS: walk better, have better balance, reach wallet  NEXT MD VISIT: none  OBJECTIVE:   DIAGNOSTIC FINDINGS: none  COGNITION: Overall cognitive status: Within functional limits for tasks assessed     SENSATION: WFL  POSTURE: rounded shoulders and forward head  PALPATION: Non tender  LOWER EXTREMITY ROM: WFL's except ankle DF was unable to get to neutral and had very poor control of lateral motions  UPPER EXTREMITY ROM:  left shoulder flexion 140, abduction 130, ER 60, IR 20 degrees with IR painful  SHOULDER MMT:  flexion/abduction 3/5, ER 3+/5, IR 3+/5 pain   LOWER EXTREMITY MMT:  MMT Right eval Left eval Right 11/27/22 Left 11/27/22  Hip flexion 4- 4- 4 4+  Hip extension 4- 4- 4 4  Hip abduction 3+ 3+ 4+ 4+  Hip adduction      Hip internal rotation      Hip external rotation      Knee flexion 4- 4- 4 4  Knee extension 4- 4- 4+ 4+  Ankle dorsiflexion 1 1    Ankle plantarflexion 2 2  Ankle inversion 3+ 3+    Ankle eversion 3+ 3+     (Blank rows = not tested) FUNCTIONAL TESTS:  5 times sit to stand: 50 seconds had to use hands after multiple attmepts Timed up and go (TUG): 32 seconds with holding wall Berg Balance Scale: 27/56  GAIT: Distance walked: 100 feet Assistive device utilized: Single point cane Level of assistance: SBA Comments: slow, tends to use walls and furniture   TODAY'S TREATMENT:                                                                                                                              DATE 01/08/23 NuStep L5 x 7 min HS curls 35lb 2x12 Leg Ext 10lb 2x12 Gait 38ft CGA no AD Backwards walking  Alt 4in box taps 2x10 Leg press 40lb 2x10  01/03/23 Bike L 2.5 x 7 min Resisted gait 10lb forward and side steps x3 each  HS curls 45lb 2x10 Leg Ext 10lb 2x10  6in step ups 1 rail x 10 each Alt 2in box taps 01/01/23 Bike L 2.5 x 7 min Resisted gait 10lb forward and side steps  x3 each  HS curls 45lb 2x10 Leg Ext 15lb 2x15 6in step ups 1 rail x 10 each S2S 2x10  12/27/22 NuStep L5 x25mins Leg ext 10# 3x12 HS curls 35# 3x12 3# hip abd and ext 2x10 3# marching 2x10 3# calf raises 2x10 STS 2x8 Straight arms pulls 10# 2x10    12/25/22 NuStep L5 x7 min 20 lb resisted Gait forward int resistance x3 CGA, attempted backwards walking x1 stopped due to pt inability to control eccentric phase Leg Curls 35lb 2x12 Leg Ext 10lb 2x12 6in step ups 1 rail x 10 each   PATIENT EDUCATION:  Education details: POC/HEP Person educated: Patient Education method: Programmer, multimedia, Facilities manager, Verbal cues, and Handouts Education comprehension: verbalized understanding  HOME EXERCISE PROGRAM: Access Code: [email or web address] URL: [email or web address] Date: 10/18/2022 Prepared by: Stacie Glaze  Exercises - Standing Hip Abduction with Counter Support  - 1 x daily - 7 x weekly - 2 sets - 10 reps - 3 hold - Standing March with Counter Support  - 1 x daily - 7 x weekly - 2 sets - 10 reps - 3 hold - Heel Raises with Counter Support  - 1 x daily - 7 x weekly - 2 sets - 10 reps - 3 hold  ASSESSMENT:  CLINICAL IMPRESSION: Patient is a 82 y.o. male who was seen today for physical therapy treatment for poor gait and repeated falls. Progressed pt to gait without AD, pt did required CGA to complete gait trial. No pain reported during session.  Still tends to drag his toes with gait and holds RW too far out front, which puts his at risk for tripping. Cue for ROM needed with HS curls and doe RW safety. No pain during session.   OBJECTIVE IMPAIRMENTS: Abnormal gait, cardiopulmonary status limiting activity, decreased activity tolerance, decreased balance,  decreased endurance, decreased mobility, difficulty walking, decreased ROM, decreased strength, increased muscle spasms, impaired flexibility, and pain.   REHAB POTENTIAL: Good  CLINICAL DECISION MAKING:  Stable/uncomplicated  EVALUATION COMPLEXITY: Low   GOALS: Goals reviewed with patient? Yes  SHORT TERM GOALS: Target date: 11/01/22 Independent with initial HEP Goal status: Met 11/01/22  LONG TERM GOALS: Target date: 01/18/23  Independent with advanced HEP Goal status: INITIAL  2.  Increase Berg balance test score to 40/56 Goal status: 31 Progression  01/03/23  3.  Decrease TUG time to 19 seconds Goal status: 14.07 Met 11/27/22 w/ RW  4.  Increase left shoulder IR to 60 degrees Goal status: 33 deg ongoing 01/03/23  5.  Increase LE strength to 4/5 Goal status: Met 11/27/42  PLAN:  PT FREQUENCY: 1-2x/week  PT DURATION: 12 weeks  PLANNED INTERVENTIONS: Therapeutic exercises, Therapeutic activity, Neuromuscular re-education, Balance training, Gait training, Patient/Family education, Self Care, Joint mobilization, Stair training, and Manual therapy  PLAN FOR NEXT SESSION: balance and LE strengthening, can try step ups on airex in bars    Grayce Sessions, PTA 01/08/2023, 11:47 AM

## 2023-01-10 ENCOUNTER — Ambulatory Visit: Payer: Medicare Other | Admitting: Physical Therapy

## 2023-01-10 ENCOUNTER — Encounter: Payer: Self-pay | Admitting: Physical Therapy

## 2023-01-10 DIAGNOSIS — M6281 Muscle weakness (generalized): Secondary | ICD-10-CM | POA: Diagnosis not present

## 2023-01-10 DIAGNOSIS — R262 Difficulty in walking, not elsewhere classified: Secondary | ICD-10-CM | POA: Diagnosis not present

## 2023-01-10 DIAGNOSIS — R296 Repeated falls: Secondary | ICD-10-CM

## 2023-01-10 NOTE — Therapy (Signed)
OUTPATIENT PHYSICAL THERAPY LOWER EXTREMITY TREATMENT Progress Note Reporting Period 11/29/22 to 01/10/23 for visits 11-20  See note below for Objective Data and Assessment of Progress/Goals.       Patient Name: Harold Pittman MRN: [id number] DOB:05-05-1940, 82 y.o., male Today's Date: 01/10/2023  END OF SESSION:  PT End of Session - 01/10/23 1143     Visit Number 20    Date for PT Re-Evaluation 01/18/23    PT Start Time 1144    PT Stop Time 1230    PT Time Calculation (min) 46 min    Activity Tolerance Patient tolerated treatment well    Behavior During Therapy WFL for tasks assessed/performed                   Past Medical History:  Diagnosis Date   Anemia    low iron   Anxiety state, unspecified    Blood in stool    Chronic airway obstruction, not elsewhere classified    Coronary artery disease    Coronary atherosclerosis of artery bypass graft    Dyspnea    ED (erectile dysfunction)    Esophageal reflux    Gout, unspecified    Osteoarthrosis, unspecified whether generalized or localized, unspecified site    Other and unspecified hyperlipidemia    Other specified disorder of rectum and anus    Personal history of tobacco use, presenting hazards to health    Sleep apnea    does not use cpap   Type II or unspecified type diabetes mellitus without mention of complication, not stated as uncontrolled    Unspecified essential hypertension    Past Surgical History:  Procedure Laterality Date   BIOPSY  09/26/2017   Procedure: BIOPSY;  Surgeon: Meryl Dare, MD;  Location: [facility] ENDOSCOPY;  Service: Endoscopy;;   BIOPSY  02/03/2018   Procedure: BIOPSY;  Surgeon: Lemar Lofty., MD;  Location: [facility] ENDOSCOPY;  Service: Gastroenterology;;   COLONOSCOPY WITH PROPOFOL N/A 09/26/2017   Procedure: COLONOSCOPY WITH PROPOFOL;  Surgeon: Meryl Dare, MD;  Location: [facility] ENDOSCOPY;  Service: Endoscopy;  Laterality: N/A;   CORONARY ARTERY BYPASS GRAFT  97   LIMA  to LAD, sequential saphenous vein graft to the first and second diagonal, sequential saphenous vein graft to the intermediate OM and circumflex and SVG to RCA   ESOPHAGOGASTRODUODENOSCOPY (EGD) WITH PROPOFOL N/A 09/26/2017   Procedure: ESOPHAGOGASTRODUODENOSCOPY (EGD) WITH PROPOFOL;  Surgeon: Meryl Dare, MD;  Location: [facility] ENDOSCOPY;  Service: Endoscopy;  Laterality: N/A;   ESOPHAGOGASTRODUODENOSCOPY (EGD) WITH PROPOFOL N/A 02/03/2018   Procedure: ESOPHAGOGASTRODUODENOSCOPY (EGD);  Surgeon: Lemar Lofty., MD;  Location: [facility] ENDOSCOPY;  Service: Gastroenterology;  Laterality: N/A;   KNEE SURGERY     BILATERAL   POLYPECTOMY  09/26/2017   Procedure: POLYPECTOMY;  Surgeon: Meryl Dare, MD;  Location: [facility] ENDOSCOPY;  Service: Endoscopy;;   ROTATOR CUFF REPAIR     SUBMUCOSAL INJECTION  02/03/2018   Procedure: SUBMUCOSAL INJECTION;  Surgeon: Lemar Lofty., MD;  Location: [facility] ENDOSCOPY;  Service: Gastroenterology;;   Patient Active Problem List   Diagnosis Date Noted   Acute gouty arthritis 03/28/2022   Gout flare 01/28/2022   Peripheral neuropathy 01/15/2022   Ataxia 01/15/2022   Type 2 diabetes mellitus (A1c 6.0 on 05/03/2021) with steroid-induced hyperglycemia 05/16/2021   CKD (chronic kidney disease) stage 4, GFR 15-29 ml/min (HCC) 05/15/2021   Olecranon bursitis, right elbow 05/15/2021   Tremor 05/15/2021   Elbow pain, left 12/05/2020   Constipation 12/05/2020  Atherosclerosis of aorta (HCC) 12/05/2020   Chest pain, atypical 12/05/2020   CTS (carpal tunnel syndrome) 03/24/2020   Grief at loss of child 03/24/2020   Cholelithiasis 09/16/2019   Nausea 09/02/2019   Weight loss 09/02/2019   Elevated troponin 01/12/2019   Hypertensive urgency 01/10/2019   Well adult exam 10/20/2018   Balanitis 10/20/2018   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/27/2017   Adenoma of stomach 12/25/2017   Abnormal findings on esophagogastroduodenoscopy (EGD) 12/25/2017    Sebaceous cyst 10/04/2017   Iron deficiency anemia    Benign neoplasm of sigmoid colon    Benign neoplasm of cecum    Benign neoplasm of transverse colon    Positive occult stool blood test 09/24/2017   (HFpEF) heart failure with preserved ejection fraction (HCC) 09/24/2017   Hypokalemia 08/17/2015   Generalized weakness 07/22/2015   Gait disorder 03/18/2014   Low back pain 01/12/2014   History of colon polyps 12/17/2013   DM (diabetes mellitus), type 2 with peripheral vascular complications (HCC) 09/16/2013   Diarrhea 10/08/2012   Edema 10/08/2011   DOE (dyspnea on exertion) 06/29/2011   LATERAL EPICONDYLITIS, LEFT 02/20/2010   TOBACCO USE, QUIT 02/23/2009   Cough 02/16/2009   Hearing loss 10/15/2008   ERECTILE DYSFUNCTION 04/30/2008   Coronary artery disease involving native coronary artery of native heart without angina pectoris 04/30/2008   COPD (chronic obstructive pulmonary disease) (HCC) 05/21/2007   Anxiety state 05/20/2007   Insomnia 03/04/2007   Hyperlipidemia LDL goal <70 02/01/2007   Gout attack 02/01/2007   Essential hypertension 02/01/2007   GERD 02/01/2007   Osteoarthritis 02/01/2007    PCP: Posey Rea, MD  REFERRING PROVIDER: Plotnikov, MD  REFERRING DIAG: Ataxic gait  THERAPY DIAG:  Muscle weakness (generalized)  Difficulty in walking, not elsewhere classified  Repeated falls  Rationale for Evaluation and Treatment: Rehabilitation  ONSET DATE: 10/15/22  SUBJECTIVE:   SUBJECTIVE STATEMENT: "I feel good man" PERTINENT HISTORY: See above PAIN:  Are you having pain? Yes: NPRS scale: 0/10 Pain location: left shoulder upper arm Pain description: sharp Aggravating factors: reaching back to get wallet 6/10 Relieving factors: rest not moving no pain  PRECAUTIONS: Fall  RED FLAGS: None   WEIGHT BEARING RESTRICTIONS: No  FALLS:  Has patient fallen in last 6 months? Yes. Number of falls 2  LIVING ENVIRONMENT: Lives with: lives alone Lives  in: House/apartment Stairs: No Has following equipment at home: Single point cane, Environmental consultant - 4 wheeled, Marine scientist  OCCUPATION: retired Psychologist, occupational  PLOF: Independent  PATIENT GOALS: walk better, have better balance, reach wallet  NEXT MD VISIT: none  OBJECTIVE:   DIAGNOSTIC FINDINGS: none  COGNITION: Overall cognitive status: Within functional limits for tasks assessed     SENSATION: WFL  POSTURE: rounded shoulders and forward head  PALPATION: Non tender  LOWER EXTREMITY ROM: WFL's except ankle DF was unable to get to neutral and had very poor control of lateral motions  UPPER EXTREMITY ROM:  left shoulder flexion 140, abduction 130, ER 60, IR 20 degrees with IR painful  SHOULDER MMT:  flexion/abduction 3/5, ER 3+/5, IR 3+/5 pain   LOWER EXTREMITY MMT:  MMT Right eval Left eval Right 11/27/22 Left 11/27/22  Hip flexion 4- 4- 4 4+  Hip extension 4- 4- 4 4  Hip abduction 3+ 3+ 4+ 4+  Hip adduction      Hip internal rotation      Hip external rotation      Knee flexion 4- 4- 4 4  Knee extension 4- 4- 4+ 4+  Ankle dorsiflexion 1 1    Ankle plantarflexion 2 2    Ankle inversion 3+ 3+    Ankle eversion 3+ 3+     (Blank rows = not tested) FUNCTIONAL TESTS:  5 times sit to stand: 50 seconds had to use hands after multiple attmepts Timed up and go (TUG): 32 seconds with holding wall Berg Balance Scale: 27/56  GAIT: Distance walked: 100 feet Assistive device utilized: Single point cane Level of assistance: SBA Comments: slow, tends to use walls and furniture   TODAY'S TREATMENT:                                                                                                                              DATE 01/10/23 NuStep L5 x 7 min Leg Press 50lb 3x12, SL 5lb x10 each  HS curls 35lb 2x15 Leg Ext 10lb 2x15  Gait 8ft CGA no AD Gait 139ft CGA no AD Alt 4in box taps   01/08/23 NuStep L5 x 7 min HS curls 35lb 2x12 Leg Ext 10lb 2x12 Gait 70ft CGA no  AD Backwards walking  Alt 4in box taps 2x10 Leg press 40lb 2x10  01/03/23 Bike L 2.5 x 7 min Resisted gait 10lb forward and side steps x3 each  HS curls 45lb 2x10 Leg Ext 10lb 2x10  6in step ups 1 rail x 10 each Alt 2in box taps 01/01/23 Bike L 2.5 x 7 min Resisted gait 10lb forward and side steps x3 each  HS curls 45lb 2x10 Leg Ext 15lb 2x15 6in step ups 1 rail x 10 each S2S 2x10  12/27/22 NuStep L5 x74mins Leg ext 10# 3x12 HS curls 35# 3x12 3# hip abd and ext 2x10 3# marching 2x10 3# calf raises 2x10 STS 2x8 Straight arms pulls 10# 2x10    12/25/22 NuStep L5 x7 min 20 lb resisted Gait forward int resistance x3 CGA, attempted backwards walking x1 stopped due to pt inability to control eccentric phase Leg Curls 35lb 2x12 Leg Ext 10lb 2x12 6in step ups 1 rail x 10 each   PATIENT EDUCATION:  Education details: POC/HEP Person educated: Patient Education method: Programmer, multimedia, Facilities manager, Verbal cues, and Handouts Education comprehension: verbalized understanding  HOME EXERCISE PROGRAM: Access Code: [email or web address] URL: [email or web address] Date: 10/18/2022 Prepared by: Stacie Glaze  Exercises - Standing Hip Abduction with Counter Support  - 1 x daily - 7 x weekly - 2 sets - 10 reps - 3 hold - Standing March with Counter Support  - 1 x daily - 7 x weekly - 2 sets - 10 reps - 3 hold - Heel Raises with Counter Support  - 1 x daily - 7 x weekly - 2 sets - 10 reps - 3 hold  ASSESSMENT:  CLINICAL IMPRESSION: Patient is a 82 y.o. male who was seen today for physical therapy treatment for poor gait and repeated falls. He enters doing well overall without pain. He is pleased with  his current functional status and reports no limitations at home. He continues to use RW for mobility. OBJECTIVE IMPAIRMENTS: Abnormal gait, cardiopulmonary status limiting activity, decreased activity tolerance, decreased balance, decreased endurance, decreased mobility, difficulty  walking, decreased ROM, decreased strength, increased muscle spasms, impaired flexibility, and pain.   REHAB POTENTIAL: Good  CLINICAL DECISION MAKING: Stable/uncomplicated  EVALUATION COMPLEXITY: Low   GOALS: Goals reviewed with patient? Yes  SHORT TERM GOALS: Target date: 11/01/22 Independent with initial HEP Goal status: Met 11/01/22  LONG TERM GOALS: Target date: 01/18/23  Independent with advanced HEP Goal status: INITIAL  2.  Increase Berg balance test score to 40/56 Goal status: 31 Progression  01/03/23  3.  Decrease TUG time to 19 seconds Goal status: 14.07 Met 11/27/22 w/ RW  4.  Increase left shoulder IR to 60 degrees Goal status: 33 deg ongoing 01/03/23  5.  Increase LE strength to 4/5 Goal status: Met 11/27/42  PLAN:  PT FREQUENCY: 1-2x/week  PT DURATION: 12 weeks  PLANNED INTERVENTIONS: Therapeutic exercises, Therapeutic activity, Neuromuscular re-education, Balance training, Gait training, Patient/Family education, Self Care, Joint mobilization, Stair training, and Manual therapy  PLAN FOR NEXT SESSION: balance and LE strengthening, can try step ups on airex in bars    Grayce Sessions, PTA 01/10/2023, 11:44 AM

## 2023-01-13 NOTE — Progress Notes (Signed)
This encounter was created in error - please disregard.

## 2023-01-14 ENCOUNTER — Telehealth: Payer: Self-pay | Admitting: Internal Medicine

## 2023-01-14 NOTE — Telephone Encounter (Signed)
Prescription Request  01/14/2023  LOV: 10/01/2022  What is the name of the medication or equipment? carvedilol  Have you contacted your pharmacy to request a refill? Yes   Which pharmacy would you like this sent to?  [facility] #[id number] [place], [place] - [phone] AT [facility] OF [facility] & [address] [place] [address] Phone: [phone] Fax: [phone]     Patient notified that their request is being sent to the clinical staff for review and that they should receive a response within 2 business days.   Please advise at Mobile [phone] (mobile)

## 2023-01-15 NOTE — Telephone Encounter (Signed)
Patient has enough medication at his local pharmacy for 1 year. I advised the patient to call his pharmacy and request a refill. He gave a verbal understanding.

## 2023-01-22 ENCOUNTER — Inpatient Hospital Stay: Payer: Medicare Other | Attending: Internal Medicine

## 2023-01-28 ENCOUNTER — Telehealth: Payer: Self-pay | Admitting: Internal Medicine

## 2023-01-28 ENCOUNTER — Other Ambulatory Visit: Payer: Self-pay

## 2023-01-28 MED ORDER — PRAVASTATIN SODIUM 80 MG PO TABS: 80.0000 mg | ORAL_TABLET | Freq: Every day | ORAL | 3 refills | Status: DC

## 2023-01-28 MED ORDER — OMEPRAZOLE 40 MG PO CPDR: 40.0000 mg | DELAYED_RELEASE_CAPSULE | Freq: Every day | ORAL | 3 refills | Status: DC

## 2023-01-28 NOTE — Telephone Encounter (Signed)
Medication has been sent into pt's pharmacy.

## 2023-01-28 NOTE — Telephone Encounter (Signed)
Prescription Request  01/28/2023  LOV: 10/01/2022  What is the name of the medication or equipment? Omeprazole and prevastatin   Have you contacted your pharmacy to request a refill? Yes   Which pharmacy would you like this sent to?  [facility] #[id number] [place], [place] - [phone] AT [facility] OF [facility] & [address] [place] [address] [place] [address] Phone: [phone] Fax: [phone]     Patient notified that their request is being sent to the clinical staff for review and that they should receive a response within 2 business days.   Please advise at Mobile [phone] (mobile)

## 2023-02-01 ENCOUNTER — Inpatient Hospital Stay: Payer: Medicare Other | Admitting: Hematology

## 2023-02-14 DIAGNOSIS — K080 Exfoliation of teeth due to systemic causes: Secondary | ICD-10-CM | POA: Diagnosis not present

## 2023-02-20 ENCOUNTER — Telehealth: Payer: Self-pay | Admitting: Internal Medicine

## 2023-02-20 NOTE — Telephone Encounter (Signed)
Prescription Request  02/20/2023  LOV: 10/01/2022  What is the name of the medication or equipment? famotodine  Have you contacted your pharmacy to request a refill? Yes   Which pharmacy would you like this sent to?  [facility] #[id number] [place], [place] - [phone] AT [facility] OF [facility] & [address] [place] [address] Phone: [phone] Fax: [phone]  [facility] - [place], [place] - [address] [facility] [address] [facility] AT [address] [facility] [place] [address] Phone: [phone] Fax: [phone]    Patient notified that their request is being sent to the clinical staff for review and that they should receive a response within 2 business days.   Please advise at Mobile [phone] (mobile)

## 2023-02-21 ENCOUNTER — Other Ambulatory Visit: Payer: Self-pay

## 2023-02-21 MED ORDER — FAMOTIDINE 40 MG PO TABS: 40.0000 mg | ORAL_TABLET | Freq: Every day | ORAL | 3 refills | Status: DC

## 2023-03-05 ENCOUNTER — Encounter: Payer: Self-pay | Admitting: Internal Medicine

## 2023-03-05 ENCOUNTER — Ambulatory Visit (INDEPENDENT_AMBULATORY_CARE_PROVIDER_SITE_OTHER): Payer: Medicare Other | Admitting: Internal Medicine

## 2023-03-05 VITALS — BP 118/80 | HR 60 | Temp 98.2°F | Ht 72.0 in | Wt 158.0 lb

## 2023-03-05 DIAGNOSIS — I700 Atherosclerosis of aorta: Secondary | ICD-10-CM

## 2023-03-05 DIAGNOSIS — D631 Anemia in chronic kidney disease: Secondary | ICD-10-CM | POA: Diagnosis not present

## 2023-03-05 DIAGNOSIS — E1151 Type 2 diabetes mellitus with diabetic peripheral angiopathy without gangrene: Secondary | ICD-10-CM

## 2023-03-05 DIAGNOSIS — R269 Unspecified abnormalities of gait and mobility: Secondary | ICD-10-CM | POA: Diagnosis not present

## 2023-03-05 DIAGNOSIS — N1832 Chronic kidney disease, stage 3b: Secondary | ICD-10-CM

## 2023-03-05 DIAGNOSIS — D649 Anemia, unspecified: Secondary | ICD-10-CM | POA: Insufficient documentation

## 2023-03-05 DIAGNOSIS — R531 Weakness: Secondary | ICD-10-CM

## 2023-03-05 DIAGNOSIS — N184 Chronic kidney disease, stage 4 (severe): Secondary | ICD-10-CM | POA: Diagnosis not present

## 2023-03-05 DIAGNOSIS — R270 Ataxia, unspecified: Secondary | ICD-10-CM | POA: Diagnosis not present

## 2023-03-05 LAB — COMPREHENSIVE METABOLIC PANEL
ALT: 28 U/L (ref 0–53)
AST: 30 U/L (ref 0–37)
Albumin: 4.2 g/dL (ref 3.5–5.2)
Alkaline Phosphatase: 107 U/L (ref 39–117)
BUN: 34 mg/dL — ABNORMAL HIGH (ref 6–23)
CO2: 32 meq/L (ref 19–32)
Calcium: 9.4 mg/dL (ref 8.4–10.5)
Chloride: 107 meq/L (ref 96–112)
Creatinine, Ser: 2.28 mg/dL — ABNORMAL HIGH (ref 0.40–1.50)
GFR: 26.18 mL/min — ABNORMAL LOW (ref >60.00)
Glucose, Bld: 124 mg/dL — ABNORMAL HIGH (ref 70–99)
Potassium: 4.1 meq/L (ref 3.5–5.1)
Sodium: 147 meq/L — ABNORMAL HIGH (ref 135–145)
Total Bilirubin: 0.6 mg/dL (ref 0.2–1.2)
Total Protein: 6.5 g/dL (ref 6.0–8.3)

## 2023-03-05 LAB — CBC WITH DIFFERENTIAL/PLATELET
Basophils Absolute: 0 10*3/uL (ref 0.0–0.1)
Basophils Relative: 0.5 % (ref 0.0–3.0)
Eosinophils Absolute: 0.3 10*3/uL (ref 0.0–0.7)
Eosinophils Relative: 4.8 % (ref 0.0–5.0)
HCT: 34 % — ABNORMAL LOW (ref 39.0–52.0)
Hemoglobin: 11.2 g/dL — ABNORMAL LOW (ref 13.0–17.0)
Lymphocytes Relative: 22.7 % (ref 12.0–46.0)
Lymphs Abs: 1.2 10*3/uL (ref 0.7–4.0)
MCHC: 33.1 g/dL (ref 30.0–36.0)
MCV: 87.5 fL (ref 78.0–100.0)
Monocytes Absolute: 0.7 10*3/uL (ref 0.1–1.0)
Monocytes Relative: 12.7 % — ABNORMAL HIGH (ref 3.0–12.0)
Neutro Abs: 3.3 10*3/uL (ref 1.4–7.7)
Neutrophils Relative %: 59.3 % (ref 43.0–77.0)
Platelets: 172 10*3/uL (ref 150.0–400.0)
RBC: 3.88 Mil/uL — ABNORMAL LOW (ref 4.22–5.81)
RDW: 16.3 % — ABNORMAL HIGH (ref 11.5–15.5)
WBC: 5.5 10*3/uL (ref 4.0–10.5)

## 2023-03-05 LAB — HEMOGLOBIN A1C: Hgb A1c MFr Bld: 5.5 % (ref 4.6–6.5)

## 2023-03-05 NOTE — Progress Notes (Signed)
Subjective:  Patient ID: Harold Pittman, male    DOB: Apr 05, 1941  Age: 82 y.o. MRN: [id number]  CC: Extremity Weakness (Pt states he is still having weakness in his legs even after PT. Pt asked that he be sent to a new PT office. Pt is also asking to discuss his medications and review if he can stop some or even have some dosages lowered if possible and his health permits. )   HPI Harold Pittman presents for Weakness, fatigue, achy joints  Outpatient Medications Prior to Visit  Medication Sig Dispense Refill   allopurinol (ZYLOPRIM) 100 MG tablet Take 0.5 tablets (50 mg total) by mouth daily. 90 tablet 1   amLODipine (NORVASC) 5 MG tablet Take 1 tablet (5 mg total) by mouth daily. 90 tablet 2   aspirin 81 MG EC tablet Take 1 tablet (81 mg total) by mouth daily. 100 tablet 3   B Complex-Folic Acid (B COMPLEX PLUS) TABS Take 1 tablet by mouth daily. 100 tablet 3   Blood Glucose Monitoring Suppl (ONETOUCH VERIO FLEX SYSTEM) w/Device KIT CHECK BLOOD SUGARS 2 TIMES A DAY. 1 kit 0   carvedilol (COREG) 25 MG tablet TAKE 1 TABLET BY MOUTH 2 TIMES DAILY WITH A MEAL 180 tablet 3   Cholecalciferol (VITAMIN D3) 50 MCG (2000 UT) capsule Take 1 capsule (2,000 Units total) by mouth daily. 100 capsule 3   colchicine 0.6 MG tablet Take 1 tablet (0.6 mg total) by mouth 2 (two) times daily. 180 tablet 3   famotidine (PEPCID) 40 MG tablet Take 1 tablet (40 mg total) by mouth daily. 90 tablet 3   ferrous sulfate 325 (65 FE) MG tablet Take 325 mg by mouth at bedtime.     glucose blood test strip Use as instructed 100 each 12   Lancets (ONETOUCH ULTRASOFT) lancets Use as instructed 100 each 12   omeprazole (PRILOSEC) 40 MG capsule Take 1 capsule (40 mg total) by mouth daily. 90 capsule 3   pravastatin (PRAVACHOL) 80 MG tablet Take 1 tablet (80 mg total) by mouth at bedtime. 90 tablet 3   repaglinide (PRANDIN) 1 MG tablet TAKE 1TABLET BY MOUTH BEFORE OR WITH DINNER 90 tablet 3   torsemide (DEMADEX) 100 MG  tablet TAKE 1 TABLET BY MOUTH DAILY (Patient taking differently: Take 100 mg by mouth daily. Take 1/2 tablet daily) 90 tablet 1   No facility-administered medications prior to visit.    ROS: Review of Systems  Constitutional:  Positive for fatigue. Negative for appetite change and unexpected weight change.  HENT:  Negative for congestion, nosebleeds, sneezing, sore throat and trouble swallowing.   Eyes:  Negative for itching and visual disturbance.  Respiratory:  Negative for cough.   Cardiovascular:  Negative for chest pain, palpitations and leg swelling.  Gastrointestinal:  Negative for abdominal distention, blood in stool, diarrhea and nausea.  Genitourinary:  Negative for frequency and hematuria.  Musculoskeletal:  Positive for arthralgias. Negative for back pain, gait problem, joint swelling and neck pain.  Skin:  Negative for rash and wound.  Neurological:  Positive for weakness. Negative for dizziness, tremors and speech difficulty.  Psychiatric/Behavioral:  Negative for agitation, dysphoric mood and sleep disturbance. The patient is not nervous/anxious.     Objective:  BP 118/80 (BP Location: Right Arm, Patient Position: Sitting, Cuff Size: Normal)   Pulse 60   Temp 98.2 F (36.8 C) (Oral)   Ht 6' (1.829 m)   Wt 158 lb (71.7 kg)   SpO2 97%  BMI 21.43 kg/m   BP Readings from Last 3 Encounters:  03/05/23 118/80  10/01/22 120/68  08/01/22 120/68    Wt Readings from Last 3 Encounters:  03/05/23 158 lb (71.7 kg)  10/01/22 164 lb (74.4 kg)  08/01/22 164 lb (74.4 kg)    Physical Exam Constitutional:      General: He is not in acute distress.    Appearance: Normal appearance. He is well-developed.     Comments: NAD  Eyes:     Conjunctiva/sclera: Conjunctivae normal.     Pupils: Pupils are equal, round, and reactive to light.  Neck:     Thyroid: No thyromegaly.     Vascular: No JVD.  Cardiovascular:     Rate and Rhythm: Normal rate and regular rhythm.     Heart  sounds: Normal heart sounds. No murmur heard.    No friction rub. No gallop.  Pulmonary:     Effort: Pulmonary effort is normal. No respiratory distress.     Breath sounds: Normal breath sounds. No wheezing or rales.  Chest:     Chest wall: No tenderness.  Abdominal:     General: Bowel sounds are normal. There is no distension.     Palpations: Abdomen is soft. There is no mass.     Tenderness: There is no abdominal tenderness. There is no guarding or rebound.  Musculoskeletal:        General: No tenderness. Normal range of motion.     Cervical back: Normal range of motion.  Lymphadenopathy:     Cervical: No cervical adenopathy.  Skin:    General: Skin is warm and dry.     Findings: No rash.  Neurological:     Mental Status: He is alert and oriented to person, place, and time.     Cranial Nerves: No cranial nerve deficit.     Motor: Weakness present. No abnormal muscle tone.     Coordination: Coordination abnormal.     Gait: Gait abnormal.     Deep Tendon Reflexes: Reflexes are normal and symmetric.  Psychiatric:        Behavior: Behavior normal.        Thought Content: Thought content normal.        Judgment: Judgment normal.   In a w/c Weak legs  Lab Results  Component Value Date   WBC 6.1 05/02/2022   HGB 11.5 (L) 05/02/2022   HCT 33.5 (L) 05/02/2022   PLT 198.0 05/02/2022   GLUCOSE 66 (L) 08/01/2022   CHOL 120 07/21/2019   TRIG 68.0 07/21/2019   HDL 31.70 (L) 07/21/2019   LDLCALC 75 07/21/2019   ALT 20 08/01/2022   AST 26 08/01/2022   NA 148 (H) 08/01/2022   K 4.0 08/01/2022   CL 108 08/01/2022   CREATININE 2.05 (H) 08/01/2022   BUN 32 (H) 08/01/2022   CO2 31 08/01/2022   TSH 0.70 08/01/2022   PSA 8.92 (H) 04/22/2019   INR 1.35 07/23/2015   HGBA1C 5.5 08/01/2022   MICROALBUR 38.7 (H) 11/24/2013    VAS [place] LOWER EXTREMITY VENOUS (DVT) (ONLY [facility] & [facility])  Result Date: 01/26/2022  Lower Venous DVT Study Patient Name:  Harold Pittman  Date of Exam:    01/26/2022 Medical Rec #: [id number]        Accession #:    [id number] Date of Birth: 12-16-1940         Patient Gender: M Patient Age:   63 years Exam Location:  [facility] Procedure:  VAS [place] LOWER EXTREMITY VENOUS (DVT) Referring Phys: COOPER ROBBINS --------------------------------------------------------------------------------  Indications: Edema.  Comparison Study: No prior study Performing Technologist: Gertie Fey MHA, RDMS, RVT, RDCS  Examination Guidelines: A complete evaluation includes B-mode imaging, spectral Doppler, color Doppler, and power Doppler as needed of all accessible portions of each vessel. Bilateral testing is considered an integral part of a complete examination. Limited examinations for reoccurring indications may be performed as noted. The reflux portion of the exam is performed with the patient in reverse Trendelenburg.  +---------+---------------+---------+-----------+----------+--------------+ RIGHT    CompressibilityPhasicitySpontaneityPropertiesThrombus Aging +---------+---------------+---------+-----------+----------+--------------+ CFV      Full           Yes      Yes                                 +---------+---------------+---------+-----------+----------+--------------+ SFJ      Full                                                        +---------+---------------+---------+-----------+----------+--------------+ FV Prox  Full                                                        +---------+---------------+---------+-----------+----------+--------------+ FV Mid   Full                                                        +---------+---------------+---------+-----------+----------+--------------+ FV DistalFull                                                        +---------+---------------+---------+-----------+----------+--------------+ PFV      Full                                                         +---------+---------------+---------+-----------+----------+--------------+ POP      Full           Yes      Yes                                 +---------+---------------+---------+-----------+----------+--------------+ PTV      Full                                                        +---------+---------------+---------+-----------+----------+--------------+ PERO     Full                                                        +---------+---------------+---------+-----------+----------+--------------+   +---------+---------------+---------+-----------+----------+--------------+  LEFT     CompressibilityPhasicitySpontaneityPropertiesThrombus Aging +---------+---------------+---------+-----------+----------+--------------+ CFV      Full           Yes      Yes                                 +---------+---------------+---------+-----------+----------+--------------+ SFJ      Full                                                        +---------+---------------+---------+-----------+----------+--------------+ FV Prox  Full                                                        +---------+---------------+---------+-----------+----------+--------------+ FV Mid   Full                                                        +---------+---------------+---------+-----------+----------+--------------+ FV DistalFull                                                        +---------+---------------+---------+-----------+----------+--------------+ PFV      Full                                                        +---------+---------------+---------+-----------+----------+--------------+ POP      Full           Yes      Yes                                 +---------+---------------+---------+-----------+----------+--------------+ PTV      Full                                                         +---------+---------------+---------+-----------+----------+--------------+ PERO     Full                                                        +---------+---------------+---------+-----------+----------+--------------+     Summary: RIGHT: - There is no evidence of deep vein thrombosis in the lower extremity.  - No cystic structure found in the popliteal fossa.  LEFT: - There is no evidence of deep vein thrombosis in the lower extremity.  - No  cystic structure found in the popliteal fossa.  *See table(s) above for measurements and observations. Electronically signed by Lemar Livings MD on 01/26/2022 at 5:28:02 PM.    Final    CT Chest Wo Contrast  Result Date: 01/26/2022 CLINICAL DATA:  82 year old male with history of chest pain radiating into the upper back. EXAM: CT CHEST WITHOUT CONTRAST TECHNIQUE: Multidetector CT imaging of the chest was performed following the standard protocol without IV contrast. RADIATION DOSE REDUCTION: This exam was performed according to the departmental dose-optimization program which includes automated exposure control, adjustment of the mA and/or kV according to patient size and/or use of iterative reconstruction technique. COMPARISON:  No priors. FINDINGS: Cardiovascular: Heart size is normal. There is no significant pericardial fluid, thickening or pericardial calcification. There is aortic atherosclerosis, as well as atherosclerosis of the great vessels of the mediastinum and the coronary arteries, including calcified atherosclerotic plaque in the left main, left anterior descending, left circumflex and right coronary arteries. Status post median sternotomy for CABG including LIMA to the LAD. Mediastinum/Nodes: No pathologically enlarged mediastinal or hilar lymph nodes. Please note that accurate exclusion of hilar adenopathy is limited on noncontrast CT scans. Esophagus is unremarkable in appearance. No axillary lymphadenopathy. Lungs/Pleura: No acute consolidative  airspace disease. No pleural effusions. Cluster of micro nodularity in the basal aspect of the right lower lobe, strongly favored to be benign areas of mucoid impaction within terminal bronchioles. No other larger more suspicious appearing pulmonary nodules or masses are noted. Diffuse bronchial wall thickening with mild centrilobular and paraseptal emphysema. Upper Abdomen: Aortic atherosclerosis. Calcified gallstones lying dependently in the gallbladder. Multiple low-attenuation lesions in the visualized liver, incompletely characterized on today's noncontrast CT examination, but statistically likely to represent cysts (no imaging follow-up is recommended). Incompletely imaged lesion in the upper pole of the right kidney which is complex in appearance with some faint internal calcifications. Additional 1.6 cm high attenuation lesion in the upper pole of the right kidney also noted. 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 Musculoskeletal: Median sternotomy wires. In the soft tissues of the upper anterior left chest wall adjacent to the first costochondral junction and inferior to the medial left clavicle (axial image 23 of series 3 and coronal image 59 of series 5) there is a partially calcified high density lesion measuring 3.1 x 2.4 x 1.9 cm. There are no aggressive appearing lytic or blastic lesions noted in the visualized portions of the skeleton. IMPRESSION: 1. No acute findings in the thorax to account for the patient's symptoms. 2. Unusual partially calcified soft tissue lesion located between the undersurface of the medial left clavicle in the adjacent left first costochondral junction. This is of uncertain etiology and significance, but is favored to represent a benign area of heterotopic ossification. Given the patient's advanced age, repeat noncontrast chest CT is suggested in 3-6 months to ensure  the stability of this finding. 3. Aortic atherosclerosis, in addition to left main and three-vessel coronary artery disease. Status post median sternotomy for CABG including LIMA to the LAD. 4. Diffuse bronchial wall thickening with mild centrilobular and paraseptal emphysema; imaging findings suggestive of underlying COPD. 5. Cholelithiasis. 6. Additional incidental findings, as above. Aortic Atherosclerosis (ICD10-I70.0) and Emphysema (ICD10-J43.9). Electronically Signed   By: Trudie Reed M.D.   On: 01/26/2022 09:30   DG Chest 2 View  Result Date: 01/25/2022 CLINICAL DATA:  Chest pain EXAM: CHEST - 2 VIEW  COMPARISON:  05/15/2021 FINDINGS: Stable cardiomediastinal contours status post sternotomy and CABG. No focal airspace consolidation, pleural effusion, or pneumothorax. IMPRESSION: No active cardiopulmonary disease. Electronically Signed   By: Duanne Guess D.O.   On: 01/25/2022 16:04    Assessment & Plan:   Problem List Items Addressed This Visit     DM (diabetes mellitus), type 2 with peripheral vascular complications (HCC)    Cold and numb hands - long time; PVD and neuropathy. Pt saw Dr Arbutus Leas [facility]       Relevant Orders   CBC with Differential/Platelet   Comprehensive metabolic panel   Iron, TIBC and Ferritin Panel   Hemoglobin A1c   Gait disorder    Barefoot shoes      Relevant Orders   Ambulatory referral to Home Health   Generalized weakness    Cold and numb hands - long time; PVD and neuropathy. Pt saw Dr Arbutus Leas [facility]      Atherosclerosis of aorta [facility])    On diet      CKD (chronic kidney disease) stage 4, GFR 15-29 ml/min (HCC)    Hydrate well Monitor GFR      Ataxia - Primary    Cold and numb hands - long time; PVD and neuropathy. Pt saw Dr Arbutus Leas [facility] Try bare feet shoes      Relevant Orders   Ambulatory referral to Home Health   CBC with Differential/Platelet   Comprehensive metabolic panel   Iron, TIBC and Ferritin Panel    Anemia    Check CBC      Relevant Orders   CBC with Differential/Platelet   Comprehensive metabolic panel   Iron, TIBC and Ferritin Panel      No orders of the defined types were placed in this encounter.     Follow-up: Return in about 2 months (around 05/05/2023) for a follow-up visit.  Sonda Primes, MD

## 2023-03-05 NOTE — Assessment & Plan Note (Signed)
Cold and numb hands - long time; PVD and neuropathy. Pt saw Dr Arbutus Leas [facility] Try bare feet shoes

## 2023-03-05 NOTE — Patient Instructions (Signed)
Barefoot shoes

## 2023-03-05 NOTE — Assessment & Plan Note (Signed)
Cold and numb hands - long time; PVD and neuropathy. Pt saw Dr Arbutus Leas [facility]

## 2023-03-05 NOTE — Assessment & Plan Note (Signed)
Barefoot shoes

## 2023-03-05 NOTE — Assessment & Plan Note (Signed)
Hydrate well ?Monitor GFR ?

## 2023-03-05 NOTE — Assessment & Plan Note (Signed)
On diet

## 2023-03-05 NOTE — Telephone Encounter (Signed)
error 

## 2023-03-05 NOTE — Assessment & Plan Note (Signed)
Check CBC 

## 2023-03-06 ENCOUNTER — Other Ambulatory Visit: Payer: Self-pay | Admitting: Internal Medicine

## 2023-03-06 LAB — IRON,TIBC AND FERRITIN PANEL
%SAT: 31 % (ref 20–48)
Ferritin: 135 ng/mL (ref 24–380)
Iron: 93 ug/dL (ref 50–180)
TIBC: 299 ug/dL (ref 250–425)

## 2023-03-09 DIAGNOSIS — K219 Gastro-esophageal reflux disease without esophagitis: Secondary | ICD-10-CM | POA: Diagnosis not present

## 2023-03-09 DIAGNOSIS — E1122 Type 2 diabetes mellitus with diabetic chronic kidney disease: Secondary | ICD-10-CM | POA: Diagnosis not present

## 2023-03-09 DIAGNOSIS — K8020 Calculus of gallbladder without cholecystitis without obstruction: Secondary | ICD-10-CM | POA: Diagnosis not present

## 2023-03-09 DIAGNOSIS — E1151 Type 2 diabetes mellitus with diabetic peripheral angiopathy without gangrene: Secondary | ICD-10-CM | POA: Diagnosis not present

## 2023-03-09 DIAGNOSIS — I130 Hypertensive heart and chronic kidney disease with heart failure and stage 1 through stage 4 chronic kidney disease, or unspecified chronic kidney disease: Secondary | ICD-10-CM | POA: Diagnosis not present

## 2023-03-09 DIAGNOSIS — G5600 Carpal tunnel syndrome, unspecified upper limb: Secondary | ICD-10-CM | POA: Diagnosis not present

## 2023-03-09 DIAGNOSIS — E785 Hyperlipidemia, unspecified: Secondary | ICD-10-CM | POA: Diagnosis not present

## 2023-03-09 DIAGNOSIS — M545 Low back pain, unspecified: Secondary | ICD-10-CM | POA: Diagnosis not present

## 2023-03-09 DIAGNOSIS — F411 Generalized anxiety disorder: Secondary | ICD-10-CM | POA: Diagnosis not present

## 2023-03-09 DIAGNOSIS — E1142 Type 2 diabetes mellitus with diabetic polyneuropathy: Secondary | ICD-10-CM | POA: Diagnosis not present

## 2023-03-09 DIAGNOSIS — R634 Abnormal weight loss: Secondary | ICD-10-CM | POA: Diagnosis not present

## 2023-03-09 DIAGNOSIS — G4700 Insomnia, unspecified: Secondary | ICD-10-CM | POA: Diagnosis not present

## 2023-03-09 DIAGNOSIS — N184 Chronic kidney disease, stage 4 (severe): Secondary | ICD-10-CM | POA: Diagnosis not present

## 2023-03-09 DIAGNOSIS — M1990 Unspecified osteoarthritis, unspecified site: Secondary | ICD-10-CM | POA: Diagnosis not present

## 2023-03-09 DIAGNOSIS — M1030 Gout due to renal impairment, unspecified site: Secondary | ICD-10-CM | POA: Diagnosis not present

## 2023-03-09 DIAGNOSIS — H9193 Unspecified hearing loss, bilateral: Secondary | ICD-10-CM | POA: Diagnosis not present

## 2023-03-09 DIAGNOSIS — I700 Atherosclerosis of aorta: Secondary | ICD-10-CM | POA: Diagnosis not present

## 2023-03-09 DIAGNOSIS — J439 Emphysema, unspecified: Secondary | ICD-10-CM | POA: Diagnosis not present

## 2023-03-09 DIAGNOSIS — I2510 Atherosclerotic heart disease of native coronary artery without angina pectoris: Secondary | ICD-10-CM | POA: Diagnosis not present

## 2023-03-09 DIAGNOSIS — K5900 Constipation, unspecified: Secondary | ICD-10-CM | POA: Diagnosis not present

## 2023-03-09 DIAGNOSIS — D631 Anemia in chronic kidney disease: Secondary | ICD-10-CM | POA: Diagnosis not present

## 2023-03-09 DIAGNOSIS — E1136 Type 2 diabetes mellitus with diabetic cataract: Secondary | ICD-10-CM | POA: Diagnosis not present

## 2023-03-09 DIAGNOSIS — Z951 Presence of aortocoronary bypass graft: Secondary | ICD-10-CM | POA: Diagnosis not present

## 2023-03-09 DIAGNOSIS — D509 Iron deficiency anemia, unspecified: Secondary | ICD-10-CM | POA: Diagnosis not present

## 2023-03-09 DIAGNOSIS — I5030 Unspecified diastolic (congestive) heart failure: Secondary | ICD-10-CM | POA: Diagnosis not present

## 2023-03-11 NOTE — Progress Notes (Signed)
This encounter was created in error - please disregard.

## 2023-03-12 DIAGNOSIS — E1151 Type 2 diabetes mellitus with diabetic peripheral angiopathy without gangrene: Secondary | ICD-10-CM | POA: Diagnosis not present

## 2023-03-12 DIAGNOSIS — K5900 Constipation, unspecified: Secondary | ICD-10-CM | POA: Diagnosis not present

## 2023-03-12 DIAGNOSIS — E1142 Type 2 diabetes mellitus with diabetic polyneuropathy: Secondary | ICD-10-CM | POA: Diagnosis not present

## 2023-03-12 DIAGNOSIS — I700 Atherosclerosis of aorta: Secondary | ICD-10-CM | POA: Diagnosis not present

## 2023-03-12 DIAGNOSIS — E1122 Type 2 diabetes mellitus with diabetic chronic kidney disease: Secondary | ICD-10-CM | POA: Diagnosis not present

## 2023-03-12 DIAGNOSIS — I2510 Atherosclerotic heart disease of native coronary artery without angina pectoris: Secondary | ICD-10-CM | POA: Diagnosis not present

## 2023-03-12 DIAGNOSIS — K219 Gastro-esophageal reflux disease without esophagitis: Secondary | ICD-10-CM | POA: Diagnosis not present

## 2023-03-12 DIAGNOSIS — I5030 Unspecified diastolic (congestive) heart failure: Secondary | ICD-10-CM | POA: Diagnosis not present

## 2023-03-12 DIAGNOSIS — M1030 Gout due to renal impairment, unspecified site: Secondary | ICD-10-CM | POA: Diagnosis not present

## 2023-03-12 DIAGNOSIS — E785 Hyperlipidemia, unspecified: Secondary | ICD-10-CM | POA: Diagnosis not present

## 2023-03-12 DIAGNOSIS — D631 Anemia in chronic kidney disease: Secondary | ICD-10-CM | POA: Diagnosis not present

## 2023-03-12 DIAGNOSIS — I130 Hypertensive heart and chronic kidney disease with heart failure and stage 1 through stage 4 chronic kidney disease, or unspecified chronic kidney disease: Secondary | ICD-10-CM | POA: Diagnosis not present

## 2023-03-12 DIAGNOSIS — E1136 Type 2 diabetes mellitus with diabetic cataract: Secondary | ICD-10-CM | POA: Diagnosis not present

## 2023-03-12 DIAGNOSIS — N184 Chronic kidney disease, stage 4 (severe): Secondary | ICD-10-CM | POA: Diagnosis not present

## 2023-03-12 DIAGNOSIS — J439 Emphysema, unspecified: Secondary | ICD-10-CM | POA: Diagnosis not present

## 2023-03-12 DIAGNOSIS — K8020 Calculus of gallbladder without cholecystitis without obstruction: Secondary | ICD-10-CM | POA: Diagnosis not present

## 2023-03-18 DIAGNOSIS — I700 Atherosclerosis of aorta: Secondary | ICD-10-CM | POA: Diagnosis not present

## 2023-03-18 DIAGNOSIS — E1142 Type 2 diabetes mellitus with diabetic polyneuropathy: Secondary | ICD-10-CM | POA: Diagnosis not present

## 2023-03-18 DIAGNOSIS — I2510 Atherosclerotic heart disease of native coronary artery without angina pectoris: Secondary | ICD-10-CM | POA: Diagnosis not present

## 2023-03-18 DIAGNOSIS — E1122 Type 2 diabetes mellitus with diabetic chronic kidney disease: Secondary | ICD-10-CM | POA: Diagnosis not present

## 2023-03-18 DIAGNOSIS — I130 Hypertensive heart and chronic kidney disease with heart failure and stage 1 through stage 4 chronic kidney disease, or unspecified chronic kidney disease: Secondary | ICD-10-CM | POA: Diagnosis not present

## 2023-03-18 DIAGNOSIS — J439 Emphysema, unspecified: Secondary | ICD-10-CM | POA: Diagnosis not present

## 2023-03-18 DIAGNOSIS — E1136 Type 2 diabetes mellitus with diabetic cataract: Secondary | ICD-10-CM | POA: Diagnosis not present

## 2023-03-18 DIAGNOSIS — K219 Gastro-esophageal reflux disease without esophagitis: Secondary | ICD-10-CM | POA: Diagnosis not present

## 2023-03-18 DIAGNOSIS — K8020 Calculus of gallbladder without cholecystitis without obstruction: Secondary | ICD-10-CM | POA: Diagnosis not present

## 2023-03-18 DIAGNOSIS — I5030 Unspecified diastolic (congestive) heart failure: Secondary | ICD-10-CM | POA: Diagnosis not present

## 2023-03-18 DIAGNOSIS — N184 Chronic kidney disease, stage 4 (severe): Secondary | ICD-10-CM | POA: Diagnosis not present

## 2023-03-18 DIAGNOSIS — K5900 Constipation, unspecified: Secondary | ICD-10-CM | POA: Diagnosis not present

## 2023-03-18 DIAGNOSIS — M1030 Gout due to renal impairment, unspecified site: Secondary | ICD-10-CM | POA: Diagnosis not present

## 2023-03-18 DIAGNOSIS — E1151 Type 2 diabetes mellitus with diabetic peripheral angiopathy without gangrene: Secondary | ICD-10-CM | POA: Diagnosis not present

## 2023-03-18 DIAGNOSIS — E785 Hyperlipidemia, unspecified: Secondary | ICD-10-CM | POA: Diagnosis not present

## 2023-03-18 DIAGNOSIS — D631 Anemia in chronic kidney disease: Secondary | ICD-10-CM | POA: Diagnosis not present

## 2023-03-20 DIAGNOSIS — I110 Hypertensive heart disease with heart failure: Secondary | ICD-10-CM | POA: Diagnosis not present

## 2023-03-21 ENCOUNTER — Telehealth: Payer: Self-pay | Admitting: Internal Medicine

## 2023-03-21 DIAGNOSIS — E1151 Type 2 diabetes mellitus with diabetic peripheral angiopathy without gangrene: Secondary | ICD-10-CM | POA: Diagnosis not present

## 2023-03-21 DIAGNOSIS — I2510 Atherosclerotic heart disease of native coronary artery without angina pectoris: Secondary | ICD-10-CM | POA: Diagnosis not present

## 2023-03-21 DIAGNOSIS — K219 Gastro-esophageal reflux disease without esophagitis: Secondary | ICD-10-CM | POA: Diagnosis not present

## 2023-03-21 DIAGNOSIS — K8020 Calculus of gallbladder without cholecystitis without obstruction: Secondary | ICD-10-CM | POA: Diagnosis not present

## 2023-03-21 DIAGNOSIS — E1122 Type 2 diabetes mellitus with diabetic chronic kidney disease: Secondary | ICD-10-CM | POA: Diagnosis not present

## 2023-03-21 DIAGNOSIS — J439 Emphysema, unspecified: Secondary | ICD-10-CM | POA: Diagnosis not present

## 2023-03-21 DIAGNOSIS — N184 Chronic kidney disease, stage 4 (severe): Secondary | ICD-10-CM | POA: Diagnosis not present

## 2023-03-21 DIAGNOSIS — I5030 Unspecified diastolic (congestive) heart failure: Secondary | ICD-10-CM | POA: Diagnosis not present

## 2023-03-21 DIAGNOSIS — E1142 Type 2 diabetes mellitus with diabetic polyneuropathy: Secondary | ICD-10-CM | POA: Diagnosis not present

## 2023-03-21 DIAGNOSIS — K5900 Constipation, unspecified: Secondary | ICD-10-CM | POA: Diagnosis not present

## 2023-03-21 DIAGNOSIS — E1136 Type 2 diabetes mellitus with diabetic cataract: Secondary | ICD-10-CM | POA: Diagnosis not present

## 2023-03-21 DIAGNOSIS — M1030 Gout due to renal impairment, unspecified site: Secondary | ICD-10-CM | POA: Diagnosis not present

## 2023-03-21 DIAGNOSIS — E785 Hyperlipidemia, unspecified: Secondary | ICD-10-CM | POA: Diagnosis not present

## 2023-03-21 DIAGNOSIS — D631 Anemia in chronic kidney disease: Secondary | ICD-10-CM | POA: Diagnosis not present

## 2023-03-21 DIAGNOSIS — I700 Atherosclerosis of aorta: Secondary | ICD-10-CM | POA: Diagnosis not present

## 2023-03-21 DIAGNOSIS — I130 Hypertensive heart and chronic kidney disease with heart failure and stage 1 through stage 4 chronic kidney disease, or unspecified chronic kidney disease: Secondary | ICD-10-CM | POA: Diagnosis not present

## 2023-03-21 NOTE — Telephone Encounter (Signed)
Caller [employer]:stephanie from [facility]   Phone Number:[phone]   Needs Verbal orders for what service & frequency:ot 1 time a week for two weeks, skip a week, then 1 time a week for 4 weeks

## 2023-03-24 NOTE — Telephone Encounter (Signed)
Okay. Thank you.

## 2023-03-26 DIAGNOSIS — K8020 Calculus of gallbladder without cholecystitis without obstruction: Secondary | ICD-10-CM | POA: Diagnosis not present

## 2023-03-26 DIAGNOSIS — I2510 Atherosclerotic heart disease of native coronary artery without angina pectoris: Secondary | ICD-10-CM | POA: Diagnosis not present

## 2023-03-26 DIAGNOSIS — K5900 Constipation, unspecified: Secondary | ICD-10-CM | POA: Diagnosis not present

## 2023-03-26 DIAGNOSIS — E1136 Type 2 diabetes mellitus with diabetic cataract: Secondary | ICD-10-CM | POA: Diagnosis not present

## 2023-03-26 DIAGNOSIS — I130 Hypertensive heart and chronic kidney disease with heart failure and stage 1 through stage 4 chronic kidney disease, or unspecified chronic kidney disease: Secondary | ICD-10-CM | POA: Diagnosis not present

## 2023-03-26 DIAGNOSIS — E1142 Type 2 diabetes mellitus with diabetic polyneuropathy: Secondary | ICD-10-CM | POA: Diagnosis not present

## 2023-03-26 DIAGNOSIS — I700 Atherosclerosis of aorta: Secondary | ICD-10-CM | POA: Diagnosis not present

## 2023-03-26 DIAGNOSIS — N184 Chronic kidney disease, stage 4 (severe): Secondary | ICD-10-CM | POA: Diagnosis not present

## 2023-03-26 DIAGNOSIS — M1030 Gout due to renal impairment, unspecified site: Secondary | ICD-10-CM | POA: Diagnosis not present

## 2023-03-26 DIAGNOSIS — J439 Emphysema, unspecified: Secondary | ICD-10-CM | POA: Diagnosis not present

## 2023-03-26 DIAGNOSIS — D631 Anemia in chronic kidney disease: Secondary | ICD-10-CM | POA: Diagnosis not present

## 2023-03-26 DIAGNOSIS — I5030 Unspecified diastolic (congestive) heart failure: Secondary | ICD-10-CM | POA: Diagnosis not present

## 2023-03-26 DIAGNOSIS — E785 Hyperlipidemia, unspecified: Secondary | ICD-10-CM | POA: Diagnosis not present

## 2023-03-26 DIAGNOSIS — E1151 Type 2 diabetes mellitus with diabetic peripheral angiopathy without gangrene: Secondary | ICD-10-CM | POA: Diagnosis not present

## 2023-03-26 DIAGNOSIS — E1122 Type 2 diabetes mellitus with diabetic chronic kidney disease: Secondary | ICD-10-CM | POA: Diagnosis not present

## 2023-03-26 DIAGNOSIS — K219 Gastro-esophageal reflux disease without esophagitis: Secondary | ICD-10-CM | POA: Diagnosis not present

## 2023-03-26 NOTE — Telephone Encounter (Signed)
LDVM stephanie from [facility]     Phone [phone]   Verbal orders for OT 1 time a week for two weeks, skip a week, then 1 time a week for 4 weeks

## 2023-03-27 DIAGNOSIS — E1142 Type 2 diabetes mellitus with diabetic polyneuropathy: Secondary | ICD-10-CM | POA: Diagnosis not present

## 2023-03-27 DIAGNOSIS — E1136 Type 2 diabetes mellitus with diabetic cataract: Secondary | ICD-10-CM | POA: Diagnosis not present

## 2023-03-27 DIAGNOSIS — I700 Atherosclerosis of aorta: Secondary | ICD-10-CM | POA: Diagnosis not present

## 2023-03-27 DIAGNOSIS — E785 Hyperlipidemia, unspecified: Secondary | ICD-10-CM | POA: Diagnosis not present

## 2023-03-27 DIAGNOSIS — K8020 Calculus of gallbladder without cholecystitis without obstruction: Secondary | ICD-10-CM | POA: Diagnosis not present

## 2023-03-27 DIAGNOSIS — I5030 Unspecified diastolic (congestive) heart failure: Secondary | ICD-10-CM | POA: Diagnosis not present

## 2023-03-27 DIAGNOSIS — E1122 Type 2 diabetes mellitus with diabetic chronic kidney disease: Secondary | ICD-10-CM | POA: Diagnosis not present

## 2023-03-27 DIAGNOSIS — I2510 Atherosclerotic heart disease of native coronary artery without angina pectoris: Secondary | ICD-10-CM | POA: Diagnosis not present

## 2023-03-27 DIAGNOSIS — I130 Hypertensive heart and chronic kidney disease with heart failure and stage 1 through stage 4 chronic kidney disease, or unspecified chronic kidney disease: Secondary | ICD-10-CM | POA: Diagnosis not present

## 2023-03-27 DIAGNOSIS — E1151 Type 2 diabetes mellitus with diabetic peripheral angiopathy without gangrene: Secondary | ICD-10-CM | POA: Diagnosis not present

## 2023-03-27 DIAGNOSIS — D631 Anemia in chronic kidney disease: Secondary | ICD-10-CM | POA: Diagnosis not present

## 2023-03-27 DIAGNOSIS — K219 Gastro-esophageal reflux disease without esophagitis: Secondary | ICD-10-CM | POA: Diagnosis not present

## 2023-03-27 DIAGNOSIS — J439 Emphysema, unspecified: Secondary | ICD-10-CM | POA: Diagnosis not present

## 2023-03-27 DIAGNOSIS — M1030 Gout due to renal impairment, unspecified site: Secondary | ICD-10-CM | POA: Diagnosis not present

## 2023-03-27 DIAGNOSIS — K5900 Constipation, unspecified: Secondary | ICD-10-CM | POA: Diagnosis not present

## 2023-03-27 DIAGNOSIS — N184 Chronic kidney disease, stage 4 (severe): Secondary | ICD-10-CM | POA: Diagnosis not present

## 2023-04-04 DIAGNOSIS — I5030 Unspecified diastolic (congestive) heart failure: Secondary | ICD-10-CM

## 2023-04-04 DIAGNOSIS — N184 Chronic kidney disease, stage 4 (severe): Secondary | ICD-10-CM

## 2023-04-04 DIAGNOSIS — E1122 Type 2 diabetes mellitus with diabetic chronic kidney disease: Secondary | ICD-10-CM

## 2023-04-04 DIAGNOSIS — I700 Atherosclerosis of aorta: Secondary | ICD-10-CM

## 2023-04-04 DIAGNOSIS — M1030 Gout due to renal impairment, unspecified site: Secondary | ICD-10-CM

## 2023-04-04 DIAGNOSIS — E1136 Type 2 diabetes mellitus with diabetic cataract: Secondary | ICD-10-CM | POA: Diagnosis not present

## 2023-04-04 DIAGNOSIS — I130 Hypertensive heart and chronic kidney disease with heart failure and stage 1 through stage 4 chronic kidney disease, or unspecified chronic kidney disease: Secondary | ICD-10-CM | POA: Diagnosis not present

## 2023-04-04 DIAGNOSIS — D631 Anemia in chronic kidney disease: Secondary | ICD-10-CM

## 2023-04-04 DIAGNOSIS — I2510 Atherosclerotic heart disease of native coronary artery without angina pectoris: Secondary | ICD-10-CM

## 2023-04-04 DIAGNOSIS — E1151 Type 2 diabetes mellitus with diabetic peripheral angiopathy without gangrene: Secondary | ICD-10-CM | POA: Diagnosis not present

## 2023-04-04 DIAGNOSIS — J439 Emphysema, unspecified: Secondary | ICD-10-CM

## 2023-04-04 DIAGNOSIS — E1142 Type 2 diabetes mellitus with diabetic polyneuropathy: Secondary | ICD-10-CM | POA: Diagnosis not present

## 2023-04-05 ENCOUNTER — Telehealth: Payer: Self-pay

## 2023-04-05 DIAGNOSIS — E1142 Type 2 diabetes mellitus with diabetic polyneuropathy: Secondary | ICD-10-CM | POA: Diagnosis not present

## 2023-04-05 DIAGNOSIS — E1151 Type 2 diabetes mellitus with diabetic peripheral angiopathy without gangrene: Secondary | ICD-10-CM | POA: Diagnosis not present

## 2023-04-05 DIAGNOSIS — M1030 Gout due to renal impairment, unspecified site: Secondary | ICD-10-CM | POA: Diagnosis not present

## 2023-04-05 DIAGNOSIS — E785 Hyperlipidemia, unspecified: Secondary | ICD-10-CM | POA: Diagnosis not present

## 2023-04-05 DIAGNOSIS — E1136 Type 2 diabetes mellitus with diabetic cataract: Secondary | ICD-10-CM | POA: Diagnosis not present

## 2023-04-05 DIAGNOSIS — I700 Atherosclerosis of aorta: Secondary | ICD-10-CM | POA: Diagnosis not present

## 2023-04-05 DIAGNOSIS — J439 Emphysema, unspecified: Secondary | ICD-10-CM | POA: Diagnosis not present

## 2023-04-05 DIAGNOSIS — K219 Gastro-esophageal reflux disease without esophagitis: Secondary | ICD-10-CM | POA: Diagnosis not present

## 2023-04-05 DIAGNOSIS — D631 Anemia in chronic kidney disease: Secondary | ICD-10-CM | POA: Diagnosis not present

## 2023-04-05 DIAGNOSIS — I130 Hypertensive heart and chronic kidney disease with heart failure and stage 1 through stage 4 chronic kidney disease, or unspecified chronic kidney disease: Secondary | ICD-10-CM | POA: Diagnosis not present

## 2023-04-05 DIAGNOSIS — I5030 Unspecified diastolic (congestive) heart failure: Secondary | ICD-10-CM | POA: Diagnosis not present

## 2023-04-05 DIAGNOSIS — E1122 Type 2 diabetes mellitus with diabetic chronic kidney disease: Secondary | ICD-10-CM | POA: Diagnosis not present

## 2023-04-05 DIAGNOSIS — I2510 Atherosclerotic heart disease of native coronary artery without angina pectoris: Secondary | ICD-10-CM | POA: Diagnosis not present

## 2023-04-05 DIAGNOSIS — K5900 Constipation, unspecified: Secondary | ICD-10-CM | POA: Diagnosis not present

## 2023-04-05 DIAGNOSIS — K8020 Calculus of gallbladder without cholecystitis without obstruction: Secondary | ICD-10-CM | POA: Diagnosis not present

## 2023-04-05 DIAGNOSIS — N184 Chronic kidney disease, stage 4 (severe): Secondary | ICD-10-CM | POA: Diagnosis not present

## 2023-04-05 NOTE — Telephone Encounter (Signed)
Copied from CRM [id number]. Topic: Clinical - Medication Question >> Apr 05, 2023 12:48 PM Melissa C wrote:  Reason for CRM: PT called to let MD know that patient presented today with high BP 170/82- this was a one time thing, no adverse symptoms. Patient had ham for the holiday which could be the culprit. If this becomes a trend, Home Health PT will call again to let you know

## 2023-04-08 DIAGNOSIS — J439 Emphysema, unspecified: Secondary | ICD-10-CM | POA: Diagnosis not present

## 2023-04-08 DIAGNOSIS — K5900 Constipation, unspecified: Secondary | ICD-10-CM | POA: Diagnosis not present

## 2023-04-08 DIAGNOSIS — D509 Iron deficiency anemia, unspecified: Secondary | ICD-10-CM | POA: Diagnosis not present

## 2023-04-08 DIAGNOSIS — H9193 Unspecified hearing loss, bilateral: Secondary | ICD-10-CM | POA: Diagnosis not present

## 2023-04-08 DIAGNOSIS — M1030 Gout due to renal impairment, unspecified site: Secondary | ICD-10-CM | POA: Diagnosis not present

## 2023-04-08 DIAGNOSIS — I700 Atherosclerosis of aorta: Secondary | ICD-10-CM | POA: Diagnosis not present

## 2023-04-08 DIAGNOSIS — M545 Low back pain, unspecified: Secondary | ICD-10-CM | POA: Diagnosis not present

## 2023-04-08 DIAGNOSIS — I2510 Atherosclerotic heart disease of native coronary artery without angina pectoris: Secondary | ICD-10-CM | POA: Diagnosis not present

## 2023-04-08 DIAGNOSIS — E1142 Type 2 diabetes mellitus with diabetic polyneuropathy: Secondary | ICD-10-CM | POA: Diagnosis not present

## 2023-04-08 DIAGNOSIS — G5600 Carpal tunnel syndrome, unspecified upper limb: Secondary | ICD-10-CM | POA: Diagnosis not present

## 2023-04-08 DIAGNOSIS — E1136 Type 2 diabetes mellitus with diabetic cataract: Secondary | ICD-10-CM | POA: Diagnosis not present

## 2023-04-08 DIAGNOSIS — I130 Hypertensive heart and chronic kidney disease with heart failure and stage 1 through stage 4 chronic kidney disease, or unspecified chronic kidney disease: Secondary | ICD-10-CM | POA: Diagnosis not present

## 2023-04-08 DIAGNOSIS — K8020 Calculus of gallbladder without cholecystitis without obstruction: Secondary | ICD-10-CM | POA: Diagnosis not present

## 2023-04-08 DIAGNOSIS — I5030 Unspecified diastolic (congestive) heart failure: Secondary | ICD-10-CM | POA: Diagnosis not present

## 2023-04-08 DIAGNOSIS — E1122 Type 2 diabetes mellitus with diabetic chronic kidney disease: Secondary | ICD-10-CM | POA: Diagnosis not present

## 2023-04-08 DIAGNOSIS — E1151 Type 2 diabetes mellitus with diabetic peripheral angiopathy without gangrene: Secondary | ICD-10-CM | POA: Diagnosis not present

## 2023-04-08 DIAGNOSIS — F411 Generalized anxiety disorder: Secondary | ICD-10-CM | POA: Diagnosis not present

## 2023-04-08 DIAGNOSIS — Z951 Presence of aortocoronary bypass graft: Secondary | ICD-10-CM | POA: Diagnosis not present

## 2023-04-08 DIAGNOSIS — D631 Anemia in chronic kidney disease: Secondary | ICD-10-CM | POA: Diagnosis not present

## 2023-04-08 DIAGNOSIS — N184 Chronic kidney disease, stage 4 (severe): Secondary | ICD-10-CM | POA: Diagnosis not present

## 2023-04-08 DIAGNOSIS — R634 Abnormal weight loss: Secondary | ICD-10-CM | POA: Diagnosis not present

## 2023-04-08 DIAGNOSIS — K219 Gastro-esophageal reflux disease without esophagitis: Secondary | ICD-10-CM | POA: Diagnosis not present

## 2023-04-08 DIAGNOSIS — E785 Hyperlipidemia, unspecified: Secondary | ICD-10-CM | POA: Diagnosis not present

## 2023-04-08 DIAGNOSIS — M1990 Unspecified osteoarthritis, unspecified site: Secondary | ICD-10-CM | POA: Diagnosis not present

## 2023-04-08 DIAGNOSIS — G4700 Insomnia, unspecified: Secondary | ICD-10-CM | POA: Diagnosis not present

## 2023-04-08 NOTE — Telephone Encounter (Signed)
Noted! Thank you

## 2023-04-16 DIAGNOSIS — K5900 Constipation, unspecified: Secondary | ICD-10-CM | POA: Diagnosis not present

## 2023-04-16 DIAGNOSIS — E1142 Type 2 diabetes mellitus with diabetic polyneuropathy: Secondary | ICD-10-CM | POA: Diagnosis not present

## 2023-04-16 DIAGNOSIS — N184 Chronic kidney disease, stage 4 (severe): Secondary | ICD-10-CM | POA: Diagnosis not present

## 2023-04-16 DIAGNOSIS — E1122 Type 2 diabetes mellitus with diabetic chronic kidney disease: Secondary | ICD-10-CM | POA: Diagnosis not present

## 2023-04-16 DIAGNOSIS — E1151 Type 2 diabetes mellitus with diabetic peripheral angiopathy without gangrene: Secondary | ICD-10-CM | POA: Diagnosis not present

## 2023-04-16 DIAGNOSIS — E1136 Type 2 diabetes mellitus with diabetic cataract: Secondary | ICD-10-CM | POA: Diagnosis not present

## 2023-04-16 DIAGNOSIS — E785 Hyperlipidemia, unspecified: Secondary | ICD-10-CM | POA: Diagnosis not present

## 2023-04-16 DIAGNOSIS — J439 Emphysema, unspecified: Secondary | ICD-10-CM | POA: Diagnosis not present

## 2023-04-16 DIAGNOSIS — D631 Anemia in chronic kidney disease: Secondary | ICD-10-CM | POA: Diagnosis not present

## 2023-04-16 DIAGNOSIS — I5030 Unspecified diastolic (congestive) heart failure: Secondary | ICD-10-CM | POA: Diagnosis not present

## 2023-04-16 DIAGNOSIS — I130 Hypertensive heart and chronic kidney disease with heart failure and stage 1 through stage 4 chronic kidney disease, or unspecified chronic kidney disease: Secondary | ICD-10-CM | POA: Diagnosis not present

## 2023-04-16 DIAGNOSIS — I2510 Atherosclerotic heart disease of native coronary artery without angina pectoris: Secondary | ICD-10-CM | POA: Diagnosis not present

## 2023-04-16 DIAGNOSIS — I700 Atherosclerosis of aorta: Secondary | ICD-10-CM | POA: Diagnosis not present

## 2023-04-16 DIAGNOSIS — K8020 Calculus of gallbladder without cholecystitis without obstruction: Secondary | ICD-10-CM | POA: Diagnosis not present

## 2023-04-16 DIAGNOSIS — K219 Gastro-esophageal reflux disease without esophagitis: Secondary | ICD-10-CM | POA: Diagnosis not present

## 2023-04-16 DIAGNOSIS — M1030 Gout due to renal impairment, unspecified site: Secondary | ICD-10-CM | POA: Diagnosis not present

## 2023-04-17 DIAGNOSIS — M1030 Gout due to renal impairment, unspecified site: Secondary | ICD-10-CM | POA: Diagnosis not present

## 2023-04-17 DIAGNOSIS — J439 Emphysema, unspecified: Secondary | ICD-10-CM | POA: Diagnosis not present

## 2023-04-17 DIAGNOSIS — E785 Hyperlipidemia, unspecified: Secondary | ICD-10-CM | POA: Diagnosis not present

## 2023-04-17 DIAGNOSIS — N184 Chronic kidney disease, stage 4 (severe): Secondary | ICD-10-CM | POA: Diagnosis not present

## 2023-04-17 DIAGNOSIS — I700 Atherosclerosis of aorta: Secondary | ICD-10-CM | POA: Diagnosis not present

## 2023-04-17 DIAGNOSIS — K8020 Calculus of gallbladder without cholecystitis without obstruction: Secondary | ICD-10-CM | POA: Diagnosis not present

## 2023-04-17 DIAGNOSIS — K5900 Constipation, unspecified: Secondary | ICD-10-CM | POA: Diagnosis not present

## 2023-04-17 DIAGNOSIS — I2510 Atherosclerotic heart disease of native coronary artery without angina pectoris: Secondary | ICD-10-CM | POA: Diagnosis not present

## 2023-04-17 DIAGNOSIS — D631 Anemia in chronic kidney disease: Secondary | ICD-10-CM | POA: Diagnosis not present

## 2023-04-17 DIAGNOSIS — I130 Hypertensive heart and chronic kidney disease with heart failure and stage 1 through stage 4 chronic kidney disease, or unspecified chronic kidney disease: Secondary | ICD-10-CM | POA: Diagnosis not present

## 2023-04-17 DIAGNOSIS — K219 Gastro-esophageal reflux disease without esophagitis: Secondary | ICD-10-CM | POA: Diagnosis not present

## 2023-04-17 DIAGNOSIS — E1142 Type 2 diabetes mellitus with diabetic polyneuropathy: Secondary | ICD-10-CM | POA: Diagnosis not present

## 2023-04-17 DIAGNOSIS — I5030 Unspecified diastolic (congestive) heart failure: Secondary | ICD-10-CM | POA: Diagnosis not present

## 2023-04-17 DIAGNOSIS — E1136 Type 2 diabetes mellitus with diabetic cataract: Secondary | ICD-10-CM | POA: Diagnosis not present

## 2023-04-17 DIAGNOSIS — E1151 Type 2 diabetes mellitus with diabetic peripheral angiopathy without gangrene: Secondary | ICD-10-CM | POA: Diagnosis not present

## 2023-04-17 DIAGNOSIS — E1122 Type 2 diabetes mellitus with diabetic chronic kidney disease: Secondary | ICD-10-CM | POA: Diagnosis not present

## 2023-04-23 ENCOUNTER — Other Ambulatory Visit: Payer: Self-pay | Admitting: Internal Medicine

## 2023-04-23 NOTE — Telephone Encounter (Signed)
 Copied from CRM [id number]. Topic: Clinical - Medication Refill >> Apr 23, 2023  4:38 PM Corin V wrote: Most Recent Primary Care Visit:  Provider: GARALD KARLYNN GAILS  Department: [facility]  Visit Type: OFFICE VISIT  Date: 03/05/2023  Medication: colchicine  0.6 MG tablet  Has the patient contacted their pharmacy? Yes (Agent: If no, request that the patient contact the pharmacy for the refill. If patient does not wish to contact the pharmacy document the reason why and proceed with request.) (Agent: If yes, when and what did the pharmacy advise?)  Is this the correct pharmacy for this prescription? Yes If no, delete pharmacy and type the correct one.  This is the patient's preferred pharmacy:  [facility] #[id number] [place], [place] - [phone] AT [facility] OF [facility] & [address] [place] [address] Phone: [phone] Fax: [phone]   Has the prescription been filled recently? No- patient was   Is the patient out of the medication? Yes  Has the patient been seen for an appointment in the last year OR does the patient have an upcoming appointment? Yes  Can we respond through MyChart? No- call son  Agent: Please be advised that Rx refills may take up to 3 business days. We ask that you follow-up with your pharmacy.

## 2023-04-24 ENCOUNTER — Other Ambulatory Visit: Payer: Self-pay | Admitting: Internal Medicine

## 2023-04-24 ENCOUNTER — Other Ambulatory Visit: Payer: Self-pay

## 2023-05-03 DIAGNOSIS — K8020 Calculus of gallbladder without cholecystitis without obstruction: Secondary | ICD-10-CM | POA: Diagnosis not present

## 2023-05-03 DIAGNOSIS — E1142 Type 2 diabetes mellitus with diabetic polyneuropathy: Secondary | ICD-10-CM | POA: Diagnosis not present

## 2023-05-03 DIAGNOSIS — K219 Gastro-esophageal reflux disease without esophagitis: Secondary | ICD-10-CM | POA: Diagnosis not present

## 2023-05-03 DIAGNOSIS — K5900 Constipation, unspecified: Secondary | ICD-10-CM | POA: Diagnosis not present

## 2023-05-03 DIAGNOSIS — N184 Chronic kidney disease, stage 4 (severe): Secondary | ICD-10-CM | POA: Diagnosis not present

## 2023-05-03 DIAGNOSIS — E1136 Type 2 diabetes mellitus with diabetic cataract: Secondary | ICD-10-CM | POA: Diagnosis not present

## 2023-05-03 DIAGNOSIS — I5030 Unspecified diastolic (congestive) heart failure: Secondary | ICD-10-CM | POA: Diagnosis not present

## 2023-05-03 DIAGNOSIS — E785 Hyperlipidemia, unspecified: Secondary | ICD-10-CM | POA: Diagnosis not present

## 2023-05-03 DIAGNOSIS — E1151 Type 2 diabetes mellitus with diabetic peripheral angiopathy without gangrene: Secondary | ICD-10-CM | POA: Diagnosis not present

## 2023-05-03 DIAGNOSIS — E1122 Type 2 diabetes mellitus with diabetic chronic kidney disease: Secondary | ICD-10-CM | POA: Diagnosis not present

## 2023-05-03 DIAGNOSIS — I700 Atherosclerosis of aorta: Secondary | ICD-10-CM | POA: Diagnosis not present

## 2023-05-03 DIAGNOSIS — I2510 Atherosclerotic heart disease of native coronary artery without angina pectoris: Secondary | ICD-10-CM | POA: Diagnosis not present

## 2023-05-03 DIAGNOSIS — M1030 Gout due to renal impairment, unspecified site: Secondary | ICD-10-CM | POA: Diagnosis not present

## 2023-05-03 DIAGNOSIS — J439 Emphysema, unspecified: Secondary | ICD-10-CM | POA: Diagnosis not present

## 2023-05-03 DIAGNOSIS — I130 Hypertensive heart and chronic kidney disease with heart failure and stage 1 through stage 4 chronic kidney disease, or unspecified chronic kidney disease: Secondary | ICD-10-CM | POA: Diagnosis not present

## 2023-05-03 DIAGNOSIS — D631 Anemia in chronic kidney disease: Secondary | ICD-10-CM | POA: Diagnosis not present

## 2023-05-04 ENCOUNTER — Other Ambulatory Visit: Payer: Self-pay | Admitting: Internal Medicine

## 2023-05-14 NOTE — Progress Notes (Addendum)
 This encounter was created in error - please disregard.  No answer x3.  Left a detailed message for patient to call office back to reschedule.  Medical screening examination/treatment/procedure(s) were performed by non-physician practitioner and as supervising physician I was immediately available for consultation/collaboration.  I agree with above. Karlynn Noel, MD

## 2023-07-15 ENCOUNTER — Other Ambulatory Visit: Payer: Self-pay | Admitting: Internal Medicine

## 2023-07-16 ENCOUNTER — Ambulatory Visit (INDEPENDENT_AMBULATORY_CARE_PROVIDER_SITE_OTHER)

## 2023-07-16 ENCOUNTER — Ambulatory Visit (INDEPENDENT_AMBULATORY_CARE_PROVIDER_SITE_OTHER): Admitting: Internal Medicine

## 2023-07-16 ENCOUNTER — Encounter: Payer: Self-pay | Admitting: Internal Medicine

## 2023-07-16 VITALS — BP 122/68 | HR 60 | Temp 97.6°F | Ht 72.0 in | Wt 162.0 lb

## 2023-07-16 DIAGNOSIS — E1159 Type 2 diabetes mellitus with other circulatory complications: Secondary | ICD-10-CM | POA: Diagnosis not present

## 2023-07-16 DIAGNOSIS — R270 Ataxia, unspecified: Secondary | ICD-10-CM

## 2023-07-16 DIAGNOSIS — I2510 Atherosclerotic heart disease of native coronary artery without angina pectoris: Secondary | ICD-10-CM | POA: Diagnosis not present

## 2023-07-16 DIAGNOSIS — R531 Weakness: Secondary | ICD-10-CM | POA: Diagnosis not present

## 2023-07-16 DIAGNOSIS — N184 Chronic kidney disease, stage 4 (severe): Secondary | ICD-10-CM

## 2023-07-16 DIAGNOSIS — M48061 Spinal stenosis, lumbar region without neurogenic claudication: Secondary | ICD-10-CM | POA: Diagnosis not present

## 2023-07-16 DIAGNOSIS — M47816 Spondylosis without myelopathy or radiculopathy, lumbar region: Secondary | ICD-10-CM | POA: Diagnosis not present

## 2023-07-16 DIAGNOSIS — M1009 Idiopathic gout, multiple sites: Secondary | ICD-10-CM | POA: Diagnosis not present

## 2023-07-16 DIAGNOSIS — M5137 Other intervertebral disc degeneration, lumbosacral region with discogenic back pain only: Secondary | ICD-10-CM | POA: Diagnosis not present

## 2023-07-16 DIAGNOSIS — M545 Low back pain, unspecified: Secondary | ICD-10-CM | POA: Diagnosis not present

## 2023-07-16 DIAGNOSIS — G603 Idiopathic progressive neuropathy: Secondary | ICD-10-CM

## 2023-07-16 LAB — CBC WITH DIFFERENTIAL/PLATELET
Basophils Absolute: 0 10*3/uL (ref 0.0–0.1)
Basophils Relative: 0.4 % (ref 0.0–3.0)
Eosinophils Absolute: 0.4 10*3/uL (ref 0.0–0.7)
Eosinophils Relative: 5.9 % — ABNORMAL HIGH (ref 0.0–5.0)
HCT: 36.1 % — ABNORMAL LOW (ref 39.0–52.0)
Hemoglobin: 12.2 g/dL — ABNORMAL LOW (ref 13.0–17.0)
Lymphocytes Relative: 22.3 % (ref 12.0–46.0)
Lymphs Abs: 1.5 10*3/uL (ref 0.7–4.0)
MCHC: 33.7 g/dL (ref 30.0–36.0)
MCV: 86.6 fl (ref 78.0–100.0)
Monocytes Absolute: 0.8 10*3/uL (ref 0.1–1.0)
Monocytes Relative: 11.2 % (ref 3.0–12.0)
Neutro Abs: 4.2 10*3/uL (ref 1.4–7.7)
Neutrophils Relative %: 60.2 % (ref 43.0–77.0)
Platelets: 197 10*3/uL (ref 150.0–400.0)
RBC: 4.16 Mil/uL — ABNORMAL LOW (ref 4.22–5.81)
RDW: 15.5 % (ref 11.5–15.5)
WBC: 6.9 10*3/uL (ref 4.0–10.5)

## 2023-07-16 LAB — COMPREHENSIVE METABOLIC PANEL WITH GFR
ALT: 23 U/L (ref 0–53)
AST: 25 U/L (ref 0–37)
Albumin: 4.6 g/dL (ref 3.5–5.2)
Alkaline Phosphatase: 109 U/L (ref 39–117)
BUN: 40 mg/dL — ABNORMAL HIGH (ref 6–23)
CO2: 29 meq/L (ref 19–32)
Calcium: 9.7 mg/dL (ref 8.4–10.5)
Chloride: 105 meq/L (ref 96–112)
Creatinine, Ser: 2.4 mg/dL — ABNORMAL HIGH (ref 0.40–1.50)
GFR: 24.56 mL/min — ABNORMAL LOW (ref >60.00)
Glucose, Bld: 117 mg/dL — ABNORMAL HIGH (ref 70–99)
Potassium: 4.6 meq/L (ref 3.5–5.1)
Sodium: 144 meq/L (ref 135–145)
Total Bilirubin: 0.6 mg/dL (ref 0.2–1.2)
Total Protein: 7.3 g/dL (ref 6.0–8.3)

## 2023-07-16 LAB — HEMOGLOBIN A1C: Hgb A1c MFr Bld: 5.5 % (ref 4.6–6.5)

## 2023-07-16 MED ORDER — ACETAMINOPHEN-CODEINE 300-30 MG PO TABS: 1.0000 | ORAL_TABLET | Freq: Four times a day (QID) | ORAL | 1 refills | Status: AC | PRN

## 2023-07-16 NOTE — Assessment & Plan Note (Signed)
Cont on Colchicine, Medrol pack prn ?

## 2023-07-16 NOTE — Assessment & Plan Note (Signed)
Hydrate well ?Monitor GFR ?

## 2023-07-16 NOTE — Assessment & Plan Note (Addendum)
 Will try T#3 prn  Potential benefits of a long term opioids use as well as potential risks (i.e. addiction risk, apnea etc) and complications (i.e. Somnolence, constipation and others) were explained to the patient and were aknowledged.

## 2023-07-16 NOTE — Assessment & Plan Note (Signed)
 Doing worse No radicular symptoms T#3 prn Potential benefits of opioids use as well as potential risks (i.e. addiction risk, apnea etc) and complications (i.e. Somnolence, constipation and others) were explained to the patient and were aknowledged.

## 2023-07-16 NOTE — Assessment & Plan Note (Signed)
In a w/c 

## 2023-07-16 NOTE — Assessment & Plan Note (Signed)
Cont w/ASA, Pravachol, Coreg  No angina 

## 2023-07-16 NOTE — Progress Notes (Signed)
 Subjective:  Patient ID: Harold Pittman, male    DOB: 1940-12-17  Age: 83 y.o. MRN: [id number]  CC: Follow-up (F/u have some questions,  pain on lower back)   HPI Harold Pittman presents for OA, LBP - worse, HTN, CAD  Outpatient Medications Prior to Visit  Medication Sig Dispense Refill   allopurinol (ZYLOPRIM) 100 MG tablet TAKE 1/2 TABLET BY MOUTH EVERY DAY 90 tablet 1   amLODipine (NORVASC) 5 MG tablet Take 1 tablet (5 mg total) by mouth daily. 90 tablet 2   aspirin 81 MG EC tablet Take 1 tablet (81 mg total) by mouth daily. 100 tablet 3   B Complex-Folic Acid (B COMPLEX PLUS) TABS Take 1 tablet by mouth daily. 100 tablet 3   Blood Glucose Monitoring Suppl (ONETOUCH VERIO FLEX SYSTEM) w/Device KIT CHECK BLOOD SUGARS 2 TIMES A DAY. 1 kit 0   carvedilol (COREG) 25 MG tablet TAKE 1 TABLET BY MOUTH 2 TIMES DAILY WITH A MEAL 180 tablet 3   Cholecalciferol (VITAMIN D3) 50 MCG (2000 UT) capsule Take 1 capsule (2,000 Units total) by mouth daily. 100 capsule 3   colchicine 0.6 MG tablet TAKE 1 TABLET(0.6 MG) BY MOUTH DAILY 90 tablet 3   famotidine (PEPCID) 40 MG tablet Take 1 tablet (40 mg total) by mouth daily. 90 tablet 3   ferrous sulfate 325 (65 FE) MG tablet Take 325 mg by mouth at bedtime.     glucose blood test strip Use as instructed 100 each 12   Lancets (ONETOUCH ULTRASOFT) lancets Use as instructed 100 each 12   omeprazole (PRILOSEC) 40 MG capsule Take 1 capsule (40 mg total) by mouth daily. 90 capsule 3   pravastatin (PRAVACHOL) 80 MG tablet Take 1 tablet (80 mg total) by mouth at bedtime. 90 tablet 3   repaglinide (PRANDIN) 1 MG tablet TAKE 1 TABLET BY MOUTH BEFORE OR WITH DINNER 90 tablet 3   torsemide (DEMADEX) 100 MG tablet TAKE 1 TABLET BY MOUTH DAILY 90 tablet 1   No facility-administered medications prior to visit.    ROS: Review of Systems  Constitutional:  Negative for appetite change, fatigue and unexpected weight change.  HENT:  Negative for congestion,  nosebleeds, sneezing, sore throat and trouble swallowing.   Eyes:  Negative for itching and visual disturbance.  Respiratory:  Negative for cough.   Cardiovascular:  Negative for chest pain, palpitations and leg swelling.  Gastrointestinal:  Negative for abdominal distention, blood in stool, diarrhea and nausea.  Genitourinary:  Negative for frequency and hematuria.  Musculoskeletal:  Positive for arthralgias, back pain and gait problem. Negative for joint swelling and neck pain.  Skin:  Negative for rash.  Neurological:  Positive for weakness. Negative for dizziness, tremors and speech difficulty.  Psychiatric/Behavioral:  Negative for agitation, dysphoric mood, sleep disturbance and suicidal ideas. The patient is not nervous/anxious.     Objective:  BP 122/68 (BP Location: Left Arm, Patient Position: Sitting)   Pulse 60   Temp 97.6 F (36.4 C) (Temporal)   Ht 6' (1.829 m)   Wt 158 lb (71.7 kg)   SpO2 98%   BMI 21.43 kg/m   BP Readings from Last 3 Encounters:  07/16/23 122/68  03/05/23 118/80  10/01/22 120/68    Wt Readings from Last 3 Encounters:  07/16/23 158 lb (71.7 kg)  03/05/23 158 lb (71.7 kg)  10/01/22 164 lb (74.4 kg)    Physical Exam Constitutional:      General: He is not in  acute distress.    Appearance: He is well-developed.     Comments: NAD  Eyes:     Conjunctiva/sclera: Conjunctivae normal.     Pupils: Pupils are equal, round, and reactive to light.  Neck:     Thyroid: No thyromegaly.     Vascular: No JVD.  Cardiovascular:     Rate and Rhythm: Normal rate and regular rhythm.     Heart sounds: Normal heart sounds. No murmur heard.    No friction rub. No gallop.  Pulmonary:     Effort: Pulmonary effort is normal. No respiratory distress.     Breath sounds: Normal breath sounds. No wheezing or rales.  Chest:     Chest wall: No tenderness.  Abdominal:     General: Bowel sounds are normal. There is no distension.     Palpations: Abdomen is soft.  There is no mass.     Tenderness: There is no abdominal tenderness. There is no guarding or rebound.  Musculoskeletal:        General: No tenderness. Normal range of motion.     Cervical back: Normal range of motion.  Lymphadenopathy:     Cervical: No cervical adenopathy.  Skin:    General: Skin is warm and dry.     Findings: No rash.  Neurological:     Mental Status: He is alert and oriented to person, place, and time.     Cranial Nerves: No cranial nerve deficit.     Motor: No abnormal muscle tone.     Coordination: Coordination normal.     Gait: Gait normal.     Deep Tendon Reflexes: Reflexes are normal and symmetric.  Psychiatric:        Behavior: Behavior normal.        Thought Content: Thought content normal.        Judgment: Judgment normal.    In a w/c  Lab Results  Component Value Date   WBC 5.5 03/05/2023   HGB 11.2 (L) 03/05/2023   HCT 34.0 (L) 03/05/2023   PLT 172.0 03/05/2023   GLUCOSE 124 (H) 03/05/2023   CHOL 120 07/21/2019   TRIG 68.0 07/21/2019   HDL 31.70 (L) 07/21/2019   LDLCALC 75 07/21/2019   ALT 28 03/05/2023   AST 30 03/05/2023   NA 147 (H) 03/05/2023   K 4.1 03/05/2023   CL 107 03/05/2023   CREATININE 2.28 (H) 03/05/2023   BUN 34 (H) 03/05/2023   CO2 32 03/05/2023   TSH 0.70 08/01/2022   PSA 8.92 (H) 04/22/2019   INR 1.35 07/23/2015   HGBA1C 5.5 03/05/2023   MICROALBUR 38.7 (H) 11/24/2013    VAS [place] LOWER EXTREMITY VENOUS (DVT) (ONLY [facility] & [facility]) Result Date: 01/26/2022  Lower Venous DVT Study Patient Name:  Harold Pittman  Date of Exam:   01/26/2022 Medical Rec #: [id number]        Accession #:    [id number] Date of Birth: May 29, 1940         Patient Gender: M Patient Age:   82 years Exam Location:  [facility] Procedure:      VAS [place] LOWER EXTREMITY VENOUS (DVT) Referring Phys: COOPER ROBBINS --------------------------------------------------------------------------------  Indications: Edema.  Comparison Study: No prior study Performing  Technologist: Gertie Fey MHA, RDMS, RVT, RDCS  Examination Guidelines: A complete evaluation includes B-mode imaging, spectral Doppler, color Doppler, and power Doppler as needed of all accessible portions of each vessel. Bilateral testing is considered an integral part of a complete examination.  Limited examinations for reoccurring indications may be performed as noted. The reflux portion of the exam is performed with the patient in reverse Trendelenburg.  +---------+---------------+---------+-----------+----------+--------------+ RIGHT    CompressibilityPhasicitySpontaneityPropertiesThrombus Aging +---------+---------------+---------+-----------+----------+--------------+ CFV      Full           Yes      Yes                                 +---------+---------------+---------+-----------+----------+--------------+ SFJ      Full                                                        +---------+---------------+---------+-----------+----------+--------------+ FV Prox  Full                                                        +---------+---------------+---------+-----------+----------+--------------+ FV Mid   Full                                                        +---------+---------------+---------+-----------+----------+--------------+ FV DistalFull                                                        +---------+---------------+---------+-----------+----------+--------------+ PFV      Full                                                        +---------+---------------+---------+-----------+----------+--------------+ POP      Full           Yes      Yes                                 +---------+---------------+---------+-----------+----------+--------------+ PTV      Full                                                        +---------+---------------+---------+-----------+----------+--------------+ PERO     Full                                                         +---------+---------------+---------+-----------+----------+--------------+   +---------+---------------+---------+-----------+----------+--------------+ LEFT     CompressibilityPhasicitySpontaneityPropertiesThrombus Aging +---------+---------------+---------+-----------+----------+--------------+ CFV      Full  Yes      Yes                                 +---------+---------------+---------+-----------+----------+--------------+ SFJ      Full                                                        +---------+---------------+---------+-----------+----------+--------------+ FV Prox  Full                                                        +---------+---------------+---------+-----------+----------+--------------+ FV Mid   Full                                                        +---------+---------------+---------+-----------+----------+--------------+ FV DistalFull                                                        +---------+---------------+---------+-----------+----------+--------------+ PFV      Full                                                        +---------+---------------+---------+-----------+----------+--------------+ POP      Full           Yes      Yes                                 +---------+---------------+---------+-----------+----------+--------------+ PTV      Full                                                        +---------+---------------+---------+-----------+----------+--------------+ PERO     Full                                                        +---------+---------------+---------+-----------+----------+--------------+     Summary: RIGHT: - There is no evidence of deep vein thrombosis in the lower extremity.  - No cystic structure found in the popliteal fossa.  LEFT: - There is no evidence of deep vein thrombosis in the lower extremity.  - No cystic  structure found in the popliteal fossa.  *See table(s) above for measurements and observations. Electronically signed by Lemar Livings MD on 01/26/2022 at 5:28:02  PM.    Final    CT Chest Wo Contrast Result Date: 01/26/2022 CLINICAL DATA:  83 year old male with history of chest pain radiating into the upper back. EXAM: CT CHEST WITHOUT CONTRAST TECHNIQUE: Multidetector CT imaging of the chest was performed following the standard protocol without IV contrast. RADIATION DOSE REDUCTION: This exam was performed according to the departmental dose-optimization program which includes automated exposure control, adjustment of the mA and/or kV according to patient size and/or use of iterative reconstruction technique. COMPARISON:  No priors. FINDINGS: Cardiovascular: Heart size is normal. There is no significant pericardial fluid, thickening or pericardial calcification. There is aortic atherosclerosis, as well as atherosclerosis of the great vessels of the mediastinum and the coronary arteries, including calcified atherosclerotic plaque in the left main, left anterior descending, left circumflex and right coronary arteries. Status post median sternotomy for CABG including LIMA to the LAD. Mediastinum/Nodes: No pathologically enlarged mediastinal or hilar lymph nodes. Please note that accurate exclusion of hilar adenopathy is limited on noncontrast CT scans. Esophagus is unremarkable in appearance. No axillary lymphadenopathy. Lungs/Pleura: No acute consolidative airspace disease. No pleural effusions. Cluster of micro nodularity in the basal aspect of the right lower lobe, strongly favored to be benign areas of mucoid impaction within terminal bronchioles. No other larger more suspicious appearing pulmonary nodules or masses are noted. Diffuse bronchial wall thickening with mild centrilobular and paraseptal emphysema. Upper Abdomen: Aortic atherosclerosis. Calcified gallstones lying dependently in the gallbladder.  Multiple low-attenuation lesions in the visualized liver, incompletely characterized on today's noncontrast CT examination, but statistically likely to represent cysts (no imaging follow-up is recommended). Incompletely imaged lesion in the upper pole of the right kidney which is complex in appearance with some faint internal calcifications. Additional 1.6 cm high attenuation lesion in the upper pole of the right kidney also noted. 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 Musculoskeletal: Median sternotomy wires. In the soft tissues of the upper anterior left chest wall adjacent to the first costochondral junction and inferior to the medial left clavicle (axial image 23 of series 3 and coronal image 59 of series 5) there is a partially calcified high density lesion measuring 3.1 x 2.4 x 1.9 cm. There are no aggressive appearing lytic or blastic lesions noted in the visualized portions of the skeleton. IMPRESSION: 1. No acute findings in the thorax to account for the patient's symptoms. 2. Unusual partially calcified soft tissue lesion located between the undersurface of the medial left clavicle in the adjacent left first costochondral junction. This is of uncertain etiology and significance, but is favored to represent a benign area of heterotopic ossification. Given the patient's advanced age, repeat noncontrast chest CT is suggested in 3-6 months to ensure the stability of this finding. 3. Aortic atherosclerosis, in addition to left main and three-vessel coronary artery disease. Status post median sternotomy for CABG including LIMA to the LAD. 4. Diffuse bronchial wall thickening with mild centrilobular and paraseptal emphysema; imaging findings suggestive of underlying COPD. 5. Cholelithiasis. 6. Additional incidental findings, as above. Aortic Atherosclerosis (ICD10-I70.0) and Emphysema (ICD10-J43.9).  Electronically Signed   By: Trudie Reed M.D.   On: 01/26/2022 09:30   DG Chest 2 View Result Date: 01/25/2022 CLINICAL DATA:  Chest pain EXAM: CHEST - 2 VIEW COMPARISON:  05/15/2021 FINDINGS: Stable cardiomediastinal contours status post sternotomy and CABG. No focal airspace consolidation, pleural effusion, or pneumothorax. IMPRESSION: No active cardiopulmonary disease. Electronically Signed  By: Duanne Guess D.O.   On: 01/25/2022 16:04    Assessment & Plan:   Problem List Items Addressed This Visit     Gout attack   Cont on Colchicine, Medrol pack prn      Coronary artery disease involving native coronary artery of native heart without angina pectoris   Cont w/ASA, Pravachol, Coreg  No angina      Low back pain - Primary   Doing worse No radicular symptoms T#3 prn Potential benefits of opioids use as well as potential risks (i.e. addiction risk, apnea etc) and complications (i.e. Somnolence, constipation and others) were explained to the patient and were aknowledged.      Relevant Medications   acetaminophen-codeine (TYLENOL #3) 300-30 MG tablet   Other Relevant Orders   DG Lumbar Spine 2-3 Views   Generalized weakness   Cold and numb hands - long time; PVD and neuropathy. 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 addiction risk, apnea etc) and complications (i.e. Somnolence, constipation and others) were explained to  the patient and were aknowledged.      Ataxia   In a w/c      Relevant Orders   CBC with Differential/Platelet      Meds ordered this encounter  Medications   acetaminophen-codeine (TYLENOL #3) 300-30 MG tablet    Sig: Take 1 tablet by mouth every 6 (six) hours as needed for severe pain (pain score 7-10).    Dispense:  20 tablet    Refill:  1      Follow-up: Return in about 3 months (around 10/15/2023) for a follow-up visit.  Sonda Primes, MD

## 2023-07-16 NOTE — Assessment & Plan Note (Signed)
Check A1c Cont on Prandin 

## 2023-07-16 NOTE — Assessment & Plan Note (Signed)
 Cold and numb hands - long time; PVD and neuropathy. Pt saw Dr Arbutus Leas [facility]

## 2023-08-15 ENCOUNTER — Ambulatory Visit

## 2023-08-15 VITALS — Ht 72.0 in | Wt 162.0 lb

## 2023-08-15 DIAGNOSIS — E1151 Type 2 diabetes mellitus with diabetic peripheral angiopathy without gangrene: Secondary | ICD-10-CM | POA: Diagnosis not present

## 2023-08-15 DIAGNOSIS — Z0000 Encounter for general adult medical examination without abnormal findings: Secondary | ICD-10-CM

## 2023-08-15 NOTE — Patient Instructions (Addendum)
 Harold Pittman , Thank you for taking time to come for your Medicare Wellness Visit. I appreciate your ongoing commitment to your health goals. Please review the following plan we discussed and let me know if I can assist you in the future.   Referrals/Orders/Follow-Ups/Clinician Recommendations: Aim for 30 minutes of exercise or brisk walking, 6-8 glasses of water, and 5 servings of fruits and vegetables each day. Educated and advised on getting the Tdap (Tetenus) and Covid vaccines in 2025.  Ordered a Diabetes Urine UCR test at lab (1st floor).  This is a list of the screening recommended for you and due dates:  Health Maintenance  Topic Date Due   DTaP/Tdap/Td vaccine (1 - Tdap) 11/25/2013   Yearly kidney health urinalysis for diabetes  11/25/2014   Complete foot exam   09/19/2017   COVID-19 Vaccine (5 - 2024-25 season) 12/09/2022   Eye exam for diabetics  03/21/2023   Flu Shot  11/08/2023   Hemoglobin A1C  01/15/2024   Yearly kidney function blood test for diabetes  07/15/2024   Medicare Annual Wellness Visit  08/14/2024   Pneumonia Vaccine  Completed   HPV Vaccine  Aged Out   Meningitis B Vaccine  Aged Out   Zoster (Shingles) Vaccine  Discontinued    Advanced directives: (Copy Requested) Please bring a copy of your health care power of attorney and living will to the office to be added to your chart at your convenience. You can mail to [facility] [address] [place], [place] [address] or email to [email or web address]  Next Medicare Annual Wellness Visit scheduled for next year: Yes  Have you seen your provider in the last 6 months (3 months if uncontrolled diabetes)? Yes- next appt 10/2023

## 2023-08-15 NOTE — Progress Notes (Cosign Needed Addendum)
 Subjective:   Harold Pittman is a 83 y.o. who presents for a Medicare Wellness preventive visit.  Visit Complete: Virtual I connected with  Chalice Colt Madariaga on 08/15/23 by a audio enabled telemedicine application and verified that I am speaking with the correct person using two identifiers.  Patient Location: Home  Provider Location: Office/Clinic  I discussed the limitations of evaluation and management by telemedicine. The patient expressed understanding and agreed to proceed.  Vital Signs: Because this visit was a virtual/telehealth visit, some criteria may be missing or patient reported. Any vitals not documented were not able to be obtained and vitals that have been documented are patient reported.  VideoDeclined- This patient declined Librarian, academic. Therefore the visit was completed with audio only.  Persons Participating in Visit: Patient.  AWV Questionnaire: No: Patient Medicare AWV questionnaire was not completed prior to this visit.  Cardiac Risk Factors include: advanced age (>18men, >74 women);dyslipidemia;diabetes mellitus;hypertension;male gender     Objective:    Today's Vitals   08/15/23 1105  Weight: 162 lb (73.5 kg)  Height: 6' (1.829 m)   Body mass index is 21.97 kg/m.     08/15/2023   10:54 AM 10/18/2022   10:13 AM 05/08/2022   11:17 AM 01/27/2022    9:30 PM 05/23/2021    9:39 AM 05/17/2021   12:46 AM 11/29/2020    2:33 PM  Advanced Directives  Does Patient Have a Medical Advance Directive? Yes No Yes No Yes Yes No  Type of Estate agent of [place];Living will  Healthcare Power of [place];Living will   Healthcare Power of Attorney   Does patient want to make changes to medical advance directive?      No - Patient declined   Copy of Healthcare Power of Attorney in Chart? No - copy requested  No - copy requested   No - copy requested   Would patient like information on creating a medical advance  directive?    No - Patient declined       Current Medications (verified) Outpatient Encounter Medications as of 08/15/2023  Medication Sig   acetaminophen -codeine  (TYLENOL  #3) 300-30 MG tablet Take 1 tablet by mouth every 6 (six) hours as needed for severe pain (pain score 7-10).   allopurinol  (ZYLOPRIM ) 100 MG tablet TAKE 1/2 TABLET BY MOUTH EVERY DAY   amLODipine  (NORVASC ) 5 MG tablet Take 1 tablet (5 mg total) by mouth daily. (Patient taking differently: Take 10 mg by mouth daily. Pt says bottle says 10mg )   aspirin  81 MG EC tablet Take 1 tablet (81 mg total) by mouth daily.   B Complex-Folic Acid  (B COMPLEX PLUS) TABS Take 1 tablet by mouth daily.   Blood Glucose Monitoring Suppl (ONETOUCH VERIO FLEX SYSTEM) w/Device KIT CHECK BLOOD SUGARS 2 TIMES A DAY.   carvedilol  (COREG ) 25 MG tablet TAKE 1 TABLET BY MOUTH 2 TIMES DAILY WITH A MEAL   Cholecalciferol  (VITAMIN D3) 50 MCG (2000 UT) capsule Take 1 capsule (2,000 Units total) by mouth daily.   colchicine  0.6 MG tablet TAKE 1 TABLET(0.6 MG) BY MOUTH DAILY   famotidine  (PEPCID ) 40 MG tablet Take 1 tablet (40 mg total) by mouth daily.   ferrous sulfate  325 (65 FE) MG tablet Take 325 mg by mouth at bedtime.   glucose blood test strip Use as instructed   Lancets (ONETOUCH ULTRASOFT) lancets Use as instructed   omeprazole  (PRILOSEC) 40 MG capsule Take 1 capsule (40 mg total) by mouth daily.  pravastatin  (PRAVACHOL ) 80 MG tablet Take 1 tablet (80 mg total) by mouth at bedtime.   repaglinide  (PRANDIN ) 1 MG tablet TAKE 1 TABLET BY MOUTH BEFORE OR WITH DINNER   torsemide  (DEMADEX ) 100 MG tablet TAKE 1 TABLET BY MOUTH DAILY   No facility-administered encounter medications on file as of 08/15/2023.    Allergies (verified) Benazepril  and Gabapentin    History: Past Medical History:  Diagnosis Date   Anemia    low iron   Anxiety state, unspecified    Blood in stool    Chronic airway obstruction, not elsewhere classified    Coronary artery  disease    Coronary atherosclerosis of artery bypass graft    Dyspnea    ED (erectile dysfunction)    Esophageal reflux    Gout, unspecified    Osteoarthrosis, unspecified whether generalized or localized, unspecified site    Other and unspecified hyperlipidemia    Other specified disorder of rectum and anus    Personal history of tobacco use, presenting hazards to health    Sleep apnea    does not use cpap   Type II or unspecified type diabetes mellitus without mention of complication, not stated as uncontrolled    Unspecified essential hypertension    Past Surgical History:  Procedure Laterality Date   BIOPSY  09/26/2017   Procedure: BIOPSY;  Surgeon: Asencion Blacksmith, MD;  Location: [facility] ENDOSCOPY;  Service: Endoscopy;;   BIOPSY  02/03/2018   Procedure: BIOPSY;  Surgeon: Normie Becton., MD;  Location[facility] ENDOSCOPY;  Service: Gastroenterology;;   COLONOSCOPY WITH PROPOFOL  N/A 09/26/2017   Procedure: COLONOSCOPY WITH PROPOFOL ;  Surgeon: Asencion Blacksmith, MD;  Location: [facility] ENDOSCOPY;  Service: Endoscopy;  Laterality: N/A;   CORONARY ARTERY BYPASS GRAFT  97   LIMA to LAD, sequential saphenous vein graft to the first and second diagonal, sequential saphenous vein graft to the intermediate OM and circumflex and SVG to RCA   ESOPHAGOGASTRODUODENOSCOPY (EGD) WITH PROPOFOL  N/A 09/26/2017   Procedure: ESOPHAGOGASTRODUODENOSCOPY (EGD) WITH PROPOFOL ;  Surgeon: Asencion Blacksmith, MD;  Location: [facility] ENDOSCOPY;  Service: Endoscopy;  Laterality: N/A;   ESOPHAGOGASTRODUODENOSCOPY (EGD) WITH PROPOFOL  N/A 02/03/2018   Procedure: ESOPHAGOGASTRODUODENOSCOPY (EGD);  Surgeon: Normie Becton., MD;  Location: [facility] ENDOSCOPY;  Service: Gastroenterology;  Laterality: N/A;   KNEE SURGERY     BILATERAL   POLYPECTOMY  09/26/2017   Procedure: POLYPECTOMY;  Surgeon: Asencion Blacksmith, MD;  Location: [facility] ENDOSCOPY;  Service: Endoscopy;;   ROTATOR CUFF REPAIR     SUBMUCOSAL INJECTION  02/03/2018    Procedure: SUBMUCOSAL INJECTION;  Surgeon: Normie Becton., MD;  Location: [facility] ENDOSCOPY;  Service: Gastroenterology;;   Family History  Problem Relation Age of Onset   Heart disease Mother 34       CAD   Mental illness Father        Alzheimer's   Depression Sister    Heart disease Sister    Stroke Son    Hypertension Other    Coronary artery disease Other        Male 1st degree relative <50   Colon cancer Neg Hx    Stomach cancer Neg Hx    Esophageal cancer Neg Hx    Liver disease Neg Hx    Pancreatic cancer Neg Hx    Rectal cancer Neg Hx    Social History   Socioeconomic History   Marital status: Widowed    Spouse name: Not on file   Number of children: Not on file  Years of education: Not on file   Highest education level: Not on file  Occupational History   Occupation: RETIRED    Comment: Barrister's clerk - welder  Tobacco Use   Smoking status: Never    Passive exposure: Never   Smokeless tobacco: Never   Tobacco comments:    Stopped 1997  Vaping Use   Vaping status: Never Used  Substance and Sexual Activity   Alcohol use: No    Comment: Stopped 1997   Drug use: No   Sexual activity: Not Currently  Other Topics Concern   Not on file  Social History Narrative   Patient does not get regular exercise-does sit exercises Tuesday one hr   Daily Caffeine use - 6      Wife decease September 14, 2005;       Social Drivers of Health   Financial Resource Strain: Low Risk  (08/15/2023)   Overall Financial Resource Strain (CARDIA)    Difficulty of Paying Living Expenses: Not hard at all  Food Insecurity: No Food Insecurity (08/15/2023)   Hunger Vital Sign    Worried About Running Out of Food in the Last Year: Never true    Ran Out of Food in the Last Year: Never true  Transportation Needs: No Transportation Needs (08/15/2023)   PRAPARE - Administrator, Civil Service (Medical): No    Lack of Transportation (Non-Medical): No  Physical Activity: Insufficiently  Active (08/15/2023)   Exercise Vital Sign    Days of Exercise per Week: 3 days    Minutes of Exercise per Session: 20 min  Stress: No Stress Concern Present (08/15/2023)   [employer] of Occupational Health - Occupational Stress Questionnaire    Feeling of Stress : Not at all  Social Connections: Moderately Isolated (08/15/2023)   Social Connection and Isolation Panel [NHANES]    Frequency of Communication with Friends and Family: More than three times a week    Frequency of Social Gatherings with Friends and Family: More than three times a week    Attends Religious Services: More than 4 times per year    Active Member of [employer] or Organizations: No    Attends Banker Meetings: Never    Marital Status: Widowed    Tobacco Counseling Counseling given: No Tobacco comments: Stopped 1997    Clinical Intake:  Pre-visit preparation completed: Yes  Pain : No/denies pain     BMI - recorded: 21.97 Nutritional Status: BMI of 19-24  Normal Nutritional Risks: None Diabetes: Yes CBG done?: No Did pt. bring in CBG monitor from home?: No  Lab Results  Component Value Date   HGBA1C 5.5 07/16/2023   HGBA1C 5.5 03/05/2023   HGBA1C 5.5 08/01/2022     How often do you need to have someone help you when you read instructions, pamphlets, or other written materials from your doctor or pharmacy?: 1 - Never  Interpreter Needed?: No  Information entered by :: Kandy Orris, CMA   Activities of Daily Living     08/15/2023   11:07 AM  In your present state of health, do you have any difficulty performing the following activities:  Hearing? 0  Vision? 0  Difficulty concentrating or making decisions? 0  Walking or climbing stairs? 0  Dressing or bathing? 0  Doing errands, shopping? 0  Preparing Food and eating ? N  Using the Toilet? N  In the past six months, have you accidently leaked urine? Y  Do you have problems with loss of  bowel control? N  Managing your  Medications? N  Managing your Finances? N  Housekeeping or managing your Housekeeping? N    Patient Care Team: Plotnikov, Oakley Bellman, MD as PCP - Jan Mcgill, MD as PCP - Cardiology (Cardiology) Candyce Champagne, MD as Consulting Physician (General Surgery) Mansouraty, Albino Alu., MD as Consulting Physician (Gastroenterology) Candi Chafe, Pearletha Bouche, MD as Consulting Physician (Ophthalmology)  Indicate any recent Medical Services you may have received from other than [facility] providers in the past year (date may be approximate).     Assessment:    This is a routine wellness examination for Jhonny.  Hearing/Vision screen Hearing Screening - Comments:: Slight hearing concerns - pt declines referral to Audiologist Vision Screening - Comments:: Wears rx glasses - up to date with routine eye exams    Goals Addressed               This Visit's Progress     Patient Stated (pt-stated)        Patient stated he wants to continue walking       Depression Screen     08/15/2023   11:09 AM 03/05/2023    2:21 PM 10/01/2022    3:30 PM 08/01/2022   10:59 AM 07/16/2022   10:42 AM 05/08/2022   11:10 AM 05/02/2022   10:29 AM  PHQ 2/9 Scores  PHQ - 2 Score 0 0 0 0 0 0 0  PHQ- 9 Score 1     0 0    Fall Risk     08/15/2023   11:13 AM 03/05/2023    2:21 PM 10/01/2022    3:30 PM 08/01/2022   10:58 AM 07/16/2022   10:42 AM  Fall Risk   Falls in the past year? 0 0 0 1 1  Number falls in past yr: 0 0 0 0 0  Injury with Fall? 0 0 0 1 1  Comment    bruised shoulder   Risk for fall due to : No Fall Risks No Fall Risks No Fall Risks Impaired balance/gait History of fall(s)  Follow up Falls prevention discussed;Falls evaluation completed Falls evaluation completed Falls evaluation completed Falls evaluation completed Falls evaluation completed    MEDICARE RISK AT HOME:  Medicare Risk at Home Any stairs in or around the home?: No If so, are there any without handrails?: No Home free of  loose throw rugs in walkways, pet beds, electrical cords, etc?: Yes Adequate lighting in your home to reduce risk of falls?: Yes Life alert?: No Use of a cane, walker or w/c?: Yes (cane/walker) Grab bars in the bathroom?: Yes Shower chair or bench in shower?: Yes Elevated toilet seat or a handicapped toilet?: Yes  TIMED UP AND GO:  Was the test performed?  No  Cognitive Function: 6CIT completed    11/02/2015   10:02 AM  MMSE - Mini Mental State Exam  Not completed: --        08/15/2023   11:13 AM 05/08/2022   11:19 AM 09/16/2019    9:23 AM  6CIT Screen  What Year? 0 points 0 points 0 points  What month? 0 points 0 points 0 points  What time? 0 points 0 points 0 points  Count back from 20 0 points 0 points 0 points  Months in reverse 4 points 4 points 0 points  Repeat phrase 2 points 2 points 4 points  Total Score 6 points 6 points 4 points    Immunizations Immunization History  Administered Date(s)  Administered   Fluad Quad(high Dose 65+) 01/27/2020, 05/03/2021   Influenza Split 03/15/2011, 02/08/2012   Influenza Whole 03/04/2006, 03/04/2007, 03/15/2009, 12/14/2009   Influenza, High Dose Seasonal PF 03/16/2013, 12/14/2014, 01/30/2016, 12/31/2016, 01/08/2018, 01/06/2019, 01/02/2022   Influenza,inj,Quad PF,6+ Mos 12/17/2013   PFIZER Comirnaty(Gray Top)Covid-19 Tri-Sucrose Vaccine 01/12/2022   PFIZER(Purple Top)SARS-COV-2 Vaccination 05/14/2019, 06/04/2019, 01/27/2020   Pneumococcal Conjugate-13 09/16/2013   Pneumococcal Polysaccharide-23 12/20/2004, 03/15/2011   Tetanus 11/24/2013    Screening Tests Health Maintenance  Topic Date Due   DTaP/Tdap/Td (1 - Tdap) 11/25/2013   Diabetic kidney evaluation - Urine ACR  11/25/2014   FOOT EXAM  09/19/2017   COVID-19 Vaccine (5 - 2024-25 season) 12/09/2022   OPHTHALMOLOGY EXAM  03/21/2023   INFLUENZA VACCINE  11/08/2023   HEMOGLOBIN A1C  01/15/2024   Diabetic kidney evaluation - eGFR measurement  07/15/2024   Medicare Annual  Wellness (AWV)  08/14/2024   Pneumonia Vaccine 22+ Years old  Completed   HPV VACCINES  Aged Out   Meningococcal B Vaccine  Aged Out   Zoster Vaccines- Shingrix  Discontinued    Health Maintenance  Health Maintenance Due  Topic Date Due   DTaP/Tdap/Td (1 - Tdap) 11/25/2013   Diabetic kidney evaluation - Urine ACR  11/25/2014   FOOT EXAM  09/19/2017   COVID-19 Vaccine (5 - 2024-25 season) 12/09/2022   OPHTHALMOLOGY EXAM  03/21/2023   Health Maintenance Items Addressed: UACR (Urine Albumin:Creatinine Ratio)  Additional Screening:  Vision Screening: Recommended annual ophthalmology exams for early detection of glaucoma and other disorders of the eye.  Dental Screening: Recommended annual dental exams for proper oral hygiene  Community Resource Referral / Chronic Care Management: CRR required this visit?  No   CCM required this visit?  No     Plan:     I have personally reviewed and noted the following in the patient's chart:   Medical and social history Use of alcohol, tobacco or illicit drugs  Current medications and supplements including opioid prescriptions. Patient is not currently taking opioid prescriptions. Functional ability and status Nutritional status Physical activity Advanced directives List of other physicians Hospitalizations, surgeries, and ER visits in previous 12 months Vitals Screenings to include cognitive, depression, and falls Referrals and appointments  In addition, I have reviewed and discussed with patient certain preventive protocols, quality metrics, and best practice recommendations. A written personalized care plan for preventive services as well as general preventive health recommendations were provided to patient.     Patria Bookbinder, CMA   08/15/2023   After Visit Summary: (MyChart) Due to this being a telephonic visit, the after visit summary with patients personalized plan was offered to patient via MyChart   Notes: Nothing  significant to report at this time.  Medical screening examination/treatment/procedure(s) were performed by non-physician practitioner and as supervising physician I was immediately available for consultation/collaboration.  I agree with above. Adelaide Holy, MD

## 2023-08-29 DIAGNOSIS — K080 Exfoliation of teeth due to systemic causes: Secondary | ICD-10-CM | POA: Diagnosis not present

## 2023-10-15 ENCOUNTER — Ambulatory Visit: Admitting: Internal Medicine

## 2023-10-15 ENCOUNTER — Other Ambulatory Visit: Payer: Self-pay | Admitting: Internal Medicine

## 2023-10-15 DIAGNOSIS — K080 Exfoliation of teeth due to systemic causes: Secondary | ICD-10-CM | POA: Diagnosis not present

## 2023-10-17 ENCOUNTER — Other Ambulatory Visit: Payer: Self-pay | Admitting: Internal Medicine

## 2023-10-21 ENCOUNTER — Ambulatory Visit (INDEPENDENT_AMBULATORY_CARE_PROVIDER_SITE_OTHER)

## 2023-10-21 ENCOUNTER — Encounter: Payer: Self-pay | Admitting: Internal Medicine

## 2023-10-21 ENCOUNTER — Ambulatory Visit (INDEPENDENT_AMBULATORY_CARE_PROVIDER_SITE_OTHER): Admitting: Internal Medicine

## 2023-10-21 VITALS — BP 110/60 | HR 56 | Temp 97.9°F | Ht 72.0 in | Wt 169.0 lb

## 2023-10-21 DIAGNOSIS — M6281 Muscle weakness (generalized): Secondary | ICD-10-CM | POA: Insufficient documentation

## 2023-10-21 DIAGNOSIS — I10 Essential (primary) hypertension: Secondary | ICD-10-CM

## 2023-10-21 DIAGNOSIS — M1009 Idiopathic gout, multiple sites: Secondary | ICD-10-CM

## 2023-10-21 DIAGNOSIS — E1151 Type 2 diabetes mellitus with diabetic peripheral angiopathy without gangrene: Secondary | ICD-10-CM

## 2023-10-21 DIAGNOSIS — N184 Chronic kidney disease, stage 4 (severe): Secondary | ICD-10-CM | POA: Diagnosis not present

## 2023-10-21 DIAGNOSIS — M47812 Spondylosis without myelopathy or radiculopathy, cervical region: Secondary | ICD-10-CM | POA: Diagnosis not present

## 2023-10-21 DIAGNOSIS — M4802 Spinal stenosis, cervical region: Secondary | ICD-10-CM | POA: Diagnosis not present

## 2023-10-21 LAB — COMPREHENSIVE METABOLIC PANEL WITH GFR
ALT: 26 U/L (ref 0–53)
AST: 30 U/L (ref 0–37)
Albumin: 4.8 g/dL (ref 3.5–5.2)
Alkaline Phosphatase: 130 U/L — ABNORMAL HIGH (ref 39–117)
BUN: 37 mg/dL — ABNORMAL HIGH (ref 6–23)
CO2: 30 meq/L (ref 19–32)
Calcium: 9.7 mg/dL (ref 8.4–10.5)
Chloride: 105 meq/L (ref 96–112)
Creatinine, Ser: 2.46 mg/dL — ABNORMAL HIGH (ref 0.40–1.50)
GFR: 23.8 mL/min — ABNORMAL LOW (ref >60.00)
Glucose, Bld: 76 mg/dL (ref 70–99)
Potassium: 3.9 meq/L (ref 3.5–5.1)
Sodium: 143 meq/L (ref 135–145)
Total Bilirubin: 0.6 mg/dL (ref 0.2–1.2)
Total Protein: 7.5 g/dL (ref 6.0–8.3)

## 2023-10-21 LAB — CBC WITH DIFFERENTIAL/PLATELET
Basophils Absolute: 0 K/uL (ref 0.0–0.1)
Basophils Relative: 0.6 % (ref 0.0–3.0)
Eosinophils Absolute: 0.3 K/uL (ref 0.0–0.7)
Eosinophils Relative: 6.1 % — ABNORMAL HIGH (ref 0.0–5.0)
HCT: 35.7 % — ABNORMAL LOW (ref 39.0–52.0)
Hemoglobin: 12.1 g/dL — ABNORMAL LOW (ref 13.0–17.0)
Lymphocytes Relative: 23.2 % (ref 12.0–46.0)
Lymphs Abs: 1.3 K/uL (ref 0.7–4.0)
MCHC: 33.8 g/dL (ref 30.0–36.0)
MCV: 85.8 fl (ref 78.0–100.0)
Monocytes Absolute: 0.7 K/uL (ref 0.1–1.0)
Monocytes Relative: 11.7 % (ref 3.0–12.0)
Neutro Abs: 3.4 K/uL (ref 1.4–7.7)
Neutrophils Relative %: 58.4 % (ref 43.0–77.0)
Platelets: 194 K/uL (ref 150.0–400.0)
RBC: 4.16 Mil/uL — ABNORMAL LOW (ref 4.22–5.81)
RDW: 15.1 % (ref 11.5–15.5)
WBC: 5.8 K/uL (ref 4.0–10.5)

## 2023-10-21 LAB — HEMOGLOBIN A1C: Hgb A1c MFr Bld: 6 % (ref 4.6–6.5)

## 2023-10-21 LAB — VITAMIN B12: Vitamin B-12: 944 pg/mL — ABNORMAL HIGH (ref 211–911)

## 2023-10-21 LAB — SEDIMENTATION RATE: Sed Rate: 10 mm/h (ref 0–20)

## 2023-10-21 LAB — VITAMIN D 25 HYDROXY (VIT D DEFICIENCY, FRACTURES): VITD: 41.67 ng/mL (ref 30.00–100.00)

## 2023-10-21 LAB — T4, FREE: Free T4: 1.08 ng/dL (ref 0.60–1.60)

## 2023-10-21 NOTE — Assessment & Plan Note (Signed)
Hydrate well ?Monitor GFR ?

## 2023-10-21 NOTE — Assessment & Plan Note (Signed)
No relapse 

## 2023-10-21 NOTE — Assessment & Plan Note (Signed)
 On Amlodipine

## 2023-10-21 NOTE — Progress Notes (Signed)
 Subjective:  Patient ID: Harold Pittman, male    DOB: December 14, 1940  Age: 83 y.o. MRN: [id number]  CC: Medical Management of Chronic Issues (3 MNTH F/U)   HPI Harold Pittman presents for hands stiffness, pain, numbness x months. C/o dropping things, can't button his shirt C/o R inner knee swelling - no pain Weak legs - not new F/u on LBP - resolved  Outpatient Medications Prior to Visit  Medication Sig Dispense Refill   acetaminophen -codeine  (TYLENOL  #3) 300-30 MG tablet Take 1 tablet by mouth every 6 (six) hours as needed for severe pain (pain score 7-10). 20 tablet 1   allopurinol  (ZYLOPRIM ) 100 MG tablet TAKE 1/2 TABLET BY MOUTH EVERY DAY 90 tablet 1   amLODipine  (NORVASC ) 5 MG tablet Take 1 tablet (5 mg total) by mouth daily. (Patient taking differently: Take 10 mg by mouth daily. Pt says bottle says 10mg ) 90 tablet 2   aspirin  81 MG EC tablet Take 1 tablet (81 mg total) by mouth daily. 100 tablet 3   B Complex-Folic Acid  (B COMPLEX PLUS) TABS Take 1 tablet by mouth daily. 100 tablet 3   Blood Glucose Monitoring Suppl (ONETOUCH VERIO FLEX SYSTEM) w/Device KIT CHECK BLOOD SUGARS 2 TIMES A DAY. 1 kit 0   carvedilol  (COREG ) 25 MG tablet TAKE 1 TABLET BY MOUTH 2 TIMES DAILY WITH A MEAL 180 tablet 3   Cholecalciferol  (VITAMIN D3) 50 MCG (2000 UT) capsule Take 1 capsule (2,000 Units total) by mouth daily. 100 capsule 3   colchicine  0.6 MG tablet TAKE 1 TABLET(0.6 MG) BY MOUTH DAILY 90 tablet 3   famotidine  (PEPCID ) 40 MG tablet Take 1 tablet (40 mg total) by mouth daily. 90 tablet 3   ferrous sulfate  325 (65 FE) MG tablet Take 325 mg by mouth at bedtime.     glucose blood test strip Use as instructed 100 each 12   Lancets (ONETOUCH ULTRASOFT) lancets Use as instructed 100 each 12   omeprazole  (PRILOSEC) 40 MG capsule Take 1 capsule (40 mg total) by mouth daily. 90 capsule 3   pravastatin  (PRAVACHOL ) 80 MG tablet Take 1 tablet (80 mg total) by mouth at bedtime. 90 tablet 3   repaglinide   (PRANDIN ) 1 MG tablet TAKE 1 TABLET BY MOUTH BEFORE OR WITH DINNER 90 tablet 3   torsemide  (DEMADEX ) 100 MG tablet TAKE 1 TABLET BY MOUTH DAILY 90 tablet 1   No facility-administered medications prior to visit.    ROS: Review of Systems  Constitutional:  Negative for appetite change, fatigue and unexpected weight change.  HENT:  Negative for congestion, nosebleeds, sneezing, sore throat and trouble swallowing.   Eyes:  Negative for itching and visual disturbance.  Respiratory:  Negative for cough.   Cardiovascular:  Negative for chest pain, palpitations and leg swelling.  Gastrointestinal:  Negative for abdominal distention, blood in stool, diarrhea and nausea.  Genitourinary:  Negative for frequency and hematuria.  Musculoskeletal:  Positive for gait problem. Negative for arthralgias, back pain, joint swelling, myalgias, neck pain and neck stiffness.  Skin:  Negative for rash.  Neurological:  Positive for weakness. Negative for dizziness, tremors and speech difficulty.  Hematological:  Does not bruise/bleed easily.  Psychiatric/Behavioral:  Negative for agitation, dysphoric mood, sleep disturbance and suicidal ideas. The patient is not nervous/anxious.     Objective:  BP 110/60   Pulse (!) 56   Temp 97.9 F (36.6 C) (Oral)   Ht 6' (1.829 m)   Wt 169 lb (76.7 kg)  SpO2 97%   BMI 22.92 kg/m   BP Readings from Last 3 Encounters:  10/21/23 110/60  07/16/23 122/68  03/05/23 118/80    Wt Readings from Last 3 Encounters:  10/21/23 169 lb (76.7 kg)  08/15/23 162 lb (73.5 kg)  07/16/23 162 lb (73.5 kg)    Physical Exam Constitutional:      General: He is not in acute distress.    Appearance: Normal appearance. He is well-developed.     Comments: NAD  Eyes:     Conjunctiva/sclera: Conjunctivae normal.     Pupils: Pupils are equal, round, and reactive to light.  Neck:     Thyroid : No thyromegaly.     Vascular: No JVD.  Cardiovascular:     Rate and Rhythm: Normal rate  and regular rhythm.     Heart sounds: Normal heart sounds. No murmur heard.    No friction rub. No gallop.  Pulmonary:     Effort: Pulmonary effort is normal. No respiratory distress.     Breath sounds: Normal breath sounds. No wheezing or rales.  Chest:     Chest wall: No tenderness.  Abdominal:     General: Bowel sounds are normal. There is no distension.     Palpations: Abdomen is soft. There is no mass.     Tenderness: There is no abdominal tenderness. There is no guarding or rebound.  Musculoskeletal:        General: No swelling or tenderness. Normal range of motion.     Cervical back: Normal range of motion.     Right lower leg: No edema.     Left lower leg: No edema.  Lymphadenopathy:     Cervical: No cervical adenopathy.  Skin:    General: Skin is warm and dry.     Findings: No bruising or rash.  Neurological:     Mental Status: He is alert and oriented to person, place, and time.     Cranial Nerves: No cranial nerve deficit.     Motor: Weakness present. No abnormal muscle tone.     Coordination: Coordination normal.     Gait: Gait abnormal.     Deep Tendon Reflexes: Reflexes are normal and symmetric.  Psychiatric:        Behavior: Behavior normal.        Thought Content: Thought content normal.        Judgment: Judgment normal.    In a w/c Can walk w/cane Hands w/ weak grip B, muscle atrophy, upper arms are ok B   Lab Results  Component Value Date   WBC 6.9 07/16/2023   HGB 12.2 (L) 07/16/2023   HCT 36.1 (L) 07/16/2023   PLT 197.0 07/16/2023   GLUCOSE 117 (H) 07/16/2023   CHOL 120 07/21/2019   TRIG 68.0 07/21/2019   HDL 31.70 (L) 07/21/2019   LDLCALC 75 07/21/2019   ALT 23 07/16/2023   AST 25 07/16/2023   NA 144 07/16/2023   K 4.6 07/16/2023   CL 105 07/16/2023   CREATININE 2.40 (H) 07/16/2023   BUN 40 (H) 07/16/2023   CO2 29 07/16/2023   TSH 0.70 08/01/2022   PSA 8.92 (H) 04/22/2019   INR 1.35 07/23/2015   HGBA1C 5.5 07/16/2023   MICROALBUR  57.7 (H) 03/22/2010    VAS US  LOWER EXTREMITY VENOUS (DVT) (ONLY [facility] & [facility]) Result Date: 01/26/2022  Lower Venous DVT Study Patient Name:  Harold Pittman  Date of Exam:   01/26/2022 Medical Rec #: [id number]  Accession #:    [id number] Date of Birth: [birth year]         Patient Gender: M Patient Age:   39 years Exam Location:  [facility] Procedure:      VAS US  LOWER EXTREMITY VENOUS (DVT) Referring Phys: COOPER ROBBINS --------------------------------------------------------------------------------  Indications: Edema.  Comparison Study: No prior study Performing Technologist: Rosaline Fujisawa MHA, RDMS, RVT, RDCS  Examination Guidelines: A complete evaluation includes B-mode imaging, spectral Doppler, color Doppler, and power Doppler as needed of all accessible portions of each vessel. Bilateral testing is considered an integral part of a complete examination. Limited examinations for reoccurring indications may be performed as noted. The reflux portion of the exam is performed with the patient in reverse Trendelenburg.  +---------+---------------+---------+-----------+----------+--------------+ RIGHT    CompressibilityPhasicitySpontaneityPropertiesThrombus Aging +---------+---------------+---------+-----------+----------+--------------+ CFV      Full           Yes      Yes                                 +---------+---------------+---------+-----------+----------+--------------+ SFJ      Full                                                        +---------+---------------+---------+-----------+----------+--------------+ FV Prox  Full                                                        +---------+---------------+---------+-----------+----------+--------------+ FV Mid   Full                                                        +---------+---------------+---------+-----------+----------+--------------+ FV DistalFull                                                         +---------+---------------+---------+-----------+----------+--------------+ PFV      Full                                                        +---------+---------------+---------+-----------+----------+--------------+ POP      Full           Yes      Yes                                 +---------+---------------+---------+-----------+----------+--------------+ PTV      Full                                                        +---------+---------------+---------+-----------+----------+--------------+  PERO     Full                                                        +---------+---------------+---------+-----------+----------+--------------+   +---------+---------------+---------+-----------+----------+--------------+ LEFT     CompressibilityPhasicitySpontaneityPropertiesThrombus Aging +---------+---------------+---------+-----------+----------+--------------+ CFV      Full           Yes      Yes                                 +---------+---------------+---------+-----------+----------+--------------+ SFJ      Full                                                        +---------+---------------+---------+-----------+----------+--------------+ FV Prox  Full                                                        +---------+---------------+---------+-----------+----------+--------------+ FV Mid   Full                                                        +---------+---------------+---------+-----------+----------+--------------+ FV DistalFull                                                        +---------+---------------+---------+-----------+----------+--------------+ PFV      Full                                                        +---------+---------------+---------+-----------+----------+--------------+ POP      Full           Yes      Yes                                  +---------+---------------+---------+-----------+----------+--------------+ PTV      Full                                                        +---------+---------------+---------+-----------+----------+--------------+ PERO     Full                                                        +---------+---------------+---------+-----------+----------+--------------+  Summary: RIGHT: - There is no evidence of deep vein thrombosis in the lower extremity.  - No cystic structure found in the popliteal fossa.  LEFT: - There is no evidence of deep vein thrombosis in the lower extremity.  - No cystic structure found in the popliteal fossa.  *See table(s) above for measurements and observations. Electronically signed by Penne Colorado MD on 01/26/2022 at 5:28:02 PM.    Final    CT Chest Wo Contrast Result Date: 01/26/2022 CLINICAL DATA:  83 year old male with history of chest pain radiating into the upper back. EXAM: CT CHEST WITHOUT CONTRAST TECHNIQUE: Multidetector CT imaging of the chest was performed following the standard protocol without IV contrast. RADIATION DOSE REDUCTION: This exam was performed according to the departmental dose-optimization program which includes automated exposure control, adjustment of the mA and/or kV according to patient size and/or use of iterative reconstruction technique. COMPARISON:  No priors. FINDINGS: Cardiovascular: Heart size is normal. There is no significant pericardial fluid, thickening or pericardial calcification. There is aortic atherosclerosis, as well as atherosclerosis of the great vessels of the mediastinum and the coronary arteries, including calcified atherosclerotic plaque in the left main, left anterior descending, left circumflex and right coronary arteries. Status post median sternotomy for CABG including LIMA to the LAD. Mediastinum/Nodes: No pathologically enlarged mediastinal or hilar lymph nodes. Please note that accurate exclusion of hilar  adenopathy is limited on noncontrast CT scans. Esophagus is unremarkable in appearance. No axillary lymphadenopathy. Lungs/Pleura: No acute consolidative airspace disease. No pleural effusions. Cluster of micro nodularity in the basal aspect of the right lower lobe, strongly favored to be benign areas of mucoid impaction within terminal bronchioles. No other larger more suspicious appearing pulmonary nodules or masses are noted. Diffuse bronchial wall thickening with mild centrilobular and paraseptal emphysema. Upper Abdomen: Aortic atherosclerosis. Calcified gallstones lying dependently in the gallbladder. Multiple low-attenuation lesions in the visualized liver, incompletely characterized on today's noncontrast CT examination, but statistically likely to represent cysts (no imaging follow-up is recommended). Incompletely imaged lesion in the upper pole of the right kidney which is complex in appearance with some faint internal calcifications. Additional 1.6 cm high attenuation lesion in the upper pole of the right kidney also noted. 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 Musculoskeletal: Median sternotomy wires. In the soft tissues of the upper anterior left chest wall adjacent to the first costochondral junction and inferior to the medial left clavicle (axial image 23 of series 3 and coronal image 59 of series 5) there is a partially calcified high density lesion measuring 3.1 x 2.4 x 1.9 cm. There are no aggressive appearing lytic or blastic lesions noted in the visualized portions of the skeleton. IMPRESSION: 1. No acute findings in the thorax to account for the patient's symptoms. 2. Unusual partially calcified soft tissue lesion located between the undersurface of the medial left clavicle in the adjacent left first costochondral junction. This is of uncertain etiology and significance, but is favored  to represent a benign area of heterotopic ossification. Given the patient's advanced age, repeat noncontrast chest CT is suggested in 3-6 months to ensure the stability of this finding. 3. Aortic atherosclerosis, in addition to left main and three-vessel coronary artery disease. Status post median sternotomy for CABG including LIMA to the LAD. 4. Diffuse bronchial wall thickening with mild centrilobular and paraseptal emphysema; imaging findings suggestive of underlying COPD. 5. Cholelithiasis. 6. Additional incidental findings,  as above. Aortic Atherosclerosis (ICD10-I70.0) and Emphysema (ICD10-J43.9). Electronically Signed   By: Toribio Aye M.D.   On: 01/26/2022 09:30   DG Chest 2 View Result Date: 01/25/2022 CLINICAL DATA:  Chest pain EXAM: CHEST - 2 VIEW COMPARISON:  05/15/2021 FINDINGS: Stable cardiomediastinal contours status post sternotomy and CABG. No focal airspace consolidation, pleural effusion, or pneumothorax. IMPRESSION: No active cardiopulmonary disease. Electronically Signed   By: Mabel Converse D.O.   On: 01/25/2022 16:04    Assessment & Plan:   Problem List Items Addressed This Visit     Gout attack   No relapse      Relevant Orders   Comprehensive metabolic panel with GFR   CBC with Differential/Platelet   RPR   Sedimentation rate   T4, free   Vitamin B12   VITAMIN D  25 Hydroxy (Vit-D Deficiency, Fractures)   Hemoglobin A1c   Protein electrophoresis, serum   Aldolase   Essential hypertension   On Amlodipine       Relevant Orders   Comprehensive metabolic panel with GFR   CBC with Differential/Platelet   RPR   Sedimentation rate   T4, free   Vitamin B12   VITAMIN D  25 Hydroxy (Vit-D Deficiency, Fractures)   Hemoglobin A1c   Protein electrophoresis, serum   Aldolase   CKD (chronic kidney disease) stage 4, GFR 15-29 ml/min (HCC)   Hydrate well Monitor GFR      Relevant Orders   HIV antibody (with reflex)   Hand muscle weakness - Primary    Worse Hands w/ weak grip B, muscle atrophy, upper arms are ok B C spine X ray Doubt CTS 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     Follow-up: Return in about 2 months (around 12/22/2023) for a follow-up visit.  Marolyn Noel, MD

## 2023-10-21 NOTE — Assessment & Plan Note (Addendum)
 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

## 2023-10-22 ENCOUNTER — Ambulatory Visit: Payer: Self-pay | Admitting: Internal Medicine

## 2023-10-23 LAB — PROTEIN ELECTROPHORESIS, SERUM
Abnormal Protein Band1: 0.6 g/dL — ABNORMAL HIGH
Albumin ELP: 4.4 g/dL (ref 3.8–4.8)
Alpha 1: 0.3 g/dL (ref 0.2–0.3)
Alpha 2: 0.7 g/dL (ref 0.5–0.9)
Beta 2: 0.3 g/dL (ref 0.2–0.5)
Beta Globulin: 0.4 g/dL (ref 0.4–0.6)
Gamma Globulin: 0.9 g/dL (ref 0.8–1.7)
Total Protein: 7.1 g/dL (ref 6.1–8.1)

## 2023-10-23 LAB — HIV ANTIBODY (ROUTINE TESTING W REFLEX): HIV 1&2 Ab, 4th Generation: NONREACTIVE

## 2023-10-23 LAB — ALDOLASE: Aldolase: 7.3 U/L (ref <=8.1)

## 2023-10-23 LAB — RPR: RPR Ser Ql: NONREACTIVE

## 2023-10-23 NOTE — Progress Notes (Signed)
 Assessment/Plan:   1.  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  The weakness and paresthesias certainly could be due, at least in part, to this.  We will do an EMG to make sure that there is not coexisting median mononeuropathy.   2.    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  He was already using a walker years ago and is not today, so perhaps this is why the exam looks worse (because he is using a cane and is stumbling much more with a cane than he did with the walker).  Nonetheless, I told him that I would like to go ahead and do an MRI of the brain to make sure that we are not missing anything.  Discussed with patient that the MRI in this age group often will show hardening of the arteries/cerebral small vessel disease and atrophy of the brain.  Discussed that this is not uncommon and would be an incidental finding in this case.  We are ordering the MRI to make sure that we are not missing other nonincidental findings.  He expressed understanding.  We also discussed that he needs to get back to using his walker at all times.  He agreed with this.    - We will also do MRI of the lumbar spine.  I think that the proximal leg weakness is really part of a lumbar radiculopathy issue, but he does not really complain about back issues, so I want to make sure that is the case.  EMG will also be completed.  Between the EMG and MRI lumbar spine, I am hoping we determine an etiology.   3.  Renal insufficiency  - Patient previously with myoclonus due to gabapentin  in the face of renal insufficiency.  This resolved with discontinuation of gabapentin .  Subjective:   Harold Pittman was seen today in the neurology clinic today.  His son supplements history.  He was referred for hand weakness and paresthesias and dropping objects.  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  Today, the patient states that he is having difficulty buttoning his shirts.  Hand dexterity is poor.  I can't do anything with my hands.  Its been going on for a year.  The middle 3 fingers are numb on both sides.  No neck pain.  The feet are ok and aren't numb.  His diabetes is under good control, with his last A1c being 6.0.  He does have chronic renal disease, with his last creatinine being 2.46.  Patient reports that he feels his right leg is weaker than the left.  That has been going on for a while.  He is unsteady, but his last fall was several months ago.    ALLERGIES:   Allergies  Allergen Reactions   Benazepril  Shortness Of Breath    Cough, wheezing   Gabapentin  Other (See Comments)    myoclonus    CURRENT MEDICATIONS:  Current Outpatient Medications  Medication Instructions   acetaminophen -codeine  (TYLENOL  #3) 300-30 MG tablet 1 tablet, Oral, Every 6 hours PRN   allopurinol  (ZYLOPRIM ) 50 mg, Oral, Daily   amLODipine  (NORVASC ) 5 MG tablet TAKE 1 TABLET(5 MG) BY MOUTH DAILY   aspirin  EC 81 mg, Oral, Daily   B Complex-Folic Acid  (B COMPLEX PLUS) TABS 1  tablet, Oral, Daily   Blood Glucose Monitoring Suppl (ONETOUCH VERIO FLEX SYSTEM) w/Device KIT CHECK BLOOD SUGARS 2 TIMES A DAY.   carvedilol  (COREG ) 25 mg, Oral, 2 times daily with meals   colchicine  0.6 MG tablet TAKE 1 TABLET(0.6 MG) BY MOUTH DAILY   famotidine  (PEPCID ) 40 mg, Oral, Daily   ferrous sulfate  325 mg, Daily at bedtime   glucose blood test strip Use as instructed   Lancets (ONETOUCH ULTRASOFT) lancets Use as instructed   omeprazole  (PRILOSEC) 40 mg, Oral, Daily   pravastatin  (PRAVACHOL ) 80 mg, Oral, Daily at bedtime   repaglinide  (PRANDIN ) 1 MG tablet TAKE 1 TABLET BY MOUTH BEFORE OR WITH DINNER   torsemide  (DEMADEX ) 100 mg, Oral, Daily   Vitamin D3 2,000 Units, Oral, Daily    Objective:   PHYSICAL EXAMINATION:    VITALS:    Vitals:   10/28/23 1029  BP: 124/76  Pulse: 68  SpO2: 98%  Weight: 171 lb 6.4 oz (77.7 kg)     GEN:  The patient appears stated age and is in NAD. HEENT:  Normocephalic, atraumatic.  The mucous membranes are moist. The superficial temporal arteries are without ropiness or tenderness. CV:  RRR Lungs:  CTAB Neck/HEME:  There are no carotid bruits bilaterally.  Neurological examination:  Orientation: The patient is alert and oriented x3.  Cranial nerves: There is good facial symmetry.  Extraocular muscles are intact. The visual fields are full to confrontational testing. The speech is fluent and clear. Soft palate rises symmetrically and there is no tongue deviation. Hearing is intact to conversational tone. Sensation: Sensation is intact to light touch throughout (facial, trunk, extremities).  Pinprick is decreased in a stocking distribution on the right leg, but not on the left.  Vibration is absent at the bilateral big toe and decreased at the ankle. There is no extinction with double simultaneous stimulation.  Motor: Strength is 5/5 in the bilateral upper and left lower extremities.  Strength in the hip flexors on the right is 4/5.  Strength elsewhere in the right lower extremity is 5/5.  He does have slight decreased grip strength bilaterally.  There is some wasting over the first dorsal interossei of the hand as well as the bilateral APB.  Shoulder shrug is equal and symmetric.  There is no pronator drift. Deep tendon reflexes: Deep tendon reflexes are 2/4 at the bilateral biceps, triceps, brachioradialis, 0-1 at the bilateral patella and achilles. Plantar responses are downgoing bilaterally.  Movement examination: Tone: There is normal tone in the bilateral upper extremities.  The tone in the lower extremities is normal.  Abnormal movements: None Coordination:  There is no decremation with RAM's, although there is some slowness Gait and Station: The patient pushes off to arise.  He  is a very ataxic with his cane and has a bit of R foot drop.  He is very unsteady. I have reviewed and interpreted the following labs independently   Chemistry      Component Value Date/Time   NA 143 10/21/2023 1442   NA 143 01/02/2021 1034   K 3.9 10/21/2023 1442   CL 105 10/21/2023 1442   CO2 30 10/21/2023 1442   BUN 37 (H) 10/21/2023 1442   BUN 49 (H) 01/02/2021 1034   CREATININE 2.46 (H) 10/21/2023 1442   CREATININE 2.24 (H) 11/20/2021 1325   CREATININE 1.76 (H) 11/18/2019 0831      Component Value Date/Time   CALCIUM 9.7 10/21/2023 1442   ALKPHOS 130 (H) 10/21/2023 1442  AST 30 10/21/2023 1442   AST 19 11/20/2021 1325   ALT 26 10/21/2023 1442   ALT 13 11/20/2021 1325   BILITOT 0.6 10/21/2023 1442   BILITOT 0.7 11/20/2021 1325      Lab Results  Component Value Date   TSH 0.70 08/01/2022   Lab Results  Component Value Date   WBC 5.8 10/21/2023   HGB 12.1 (L) 10/21/2023   HCT 35.7 (L) 10/21/2023   MCV 85.8 10/21/2023   PLT 194.0 10/21/2023      Total time spent on today's visit was 43 minutes, including both face-to-face time and nonface-to-face time.  Time included that spent on review of records (prior notes available to me/labs/imaging if pertinent), discussing treatment and goals, answering patient's questions and coordinating care.  Cc:  Plotnikov, Aleksei V, MD

## 2023-10-25 NOTE — Telephone Encounter (Signed)
 1st attempt to reach pt to inform him of Dr. Alexandra advice.

## 2023-10-26 ENCOUNTER — Other Ambulatory Visit: Payer: Self-pay | Admitting: Internal Medicine

## 2023-10-28 ENCOUNTER — Encounter: Payer: Self-pay | Admitting: Neurology

## 2023-10-28 ENCOUNTER — Ambulatory Visit: Admitting: Neurology

## 2023-10-28 ENCOUNTER — Other Ambulatory Visit: Payer: Self-pay

## 2023-10-28 VITALS — BP 124/76 | HR 68 | Ht 72.0 in | Wt 171.4 lb

## 2023-10-28 DIAGNOSIS — E1142 Type 2 diabetes mellitus with diabetic polyneuropathy: Secondary | ICD-10-CM | POA: Diagnosis not present

## 2023-10-28 DIAGNOSIS — R202 Paresthesia of skin: Secondary | ICD-10-CM

## 2023-10-28 DIAGNOSIS — R270 Ataxia, unspecified: Secondary | ICD-10-CM | POA: Diagnosis not present

## 2023-10-28 DIAGNOSIS — N289 Disorder of kidney and ureter, unspecified: Secondary | ICD-10-CM

## 2023-10-28 DIAGNOSIS — M6281 Muscle weakness (generalized): Secondary | ICD-10-CM

## 2023-10-28 NOTE — Patient Instructions (Signed)
ELECTROMYOGRAM AND NERVE CONDUCTION STUDIES (EMG/NCS) INSTRUCTIONS  How to Prepare The neurologist conducting the EMG will need to know if you have certain medical conditions. Tell the neurologist and other EMG lab personnel if you: . Have a pacemaker or any other electrical medical device . Take blood-thinning medications . Have hemophilia, a blood-clotting disorder that causes prolonged bleeding Bathing Take a shower or bath shortly before your exam in order to remove oils from your skin. Don't apply lotions or creams before the exam.  What to Expect You'll likely be asked to change into a hospital gown for the procedure and lie down on an examination table. The following explanations can help you understand what will happen during the exam.  . Electrodes. The neurologist or a technician places surface electrodes at various locations on your skin depending on where you're experiencing symptoms. Or the neurologist may insert needle electrodes at different sites depending on your symptoms.  . Sensations. The electrodes will at times transmit a tiny electrical current that you may feel as a twinge or spasm. The needle electrode may cause discomfort or pain that usually ends shortly after the needle is removed. If you are concerned about discomfort or pain, you may want to talk to the neurologist about taking a short break during the exam.  . Instructions. During the needle EMG, the neurologist will assess whether there is any spontaneous electrical activity when the muscle is at rest - activity that isn't present in healthy muscle tissue - and the degree of activity when you slightly contract the muscle.  He or she will give you instructions on resting and contracting a muscle at appropriate times. Depending on what muscles and nerves the neurologist is examining, he or she may ask you to change positions during the exam.  After your EMG You may experience some temporary, minor bruising where the  needle electrode was inserted into your muscle. This bruising should fade within several days. If it persists, contact your primary care doctor.    

## 2023-11-01 ENCOUNTER — Other Ambulatory Visit: Payer: Self-pay | Admitting: Internal Medicine

## 2023-11-15 ENCOUNTER — Other Ambulatory Visit: Payer: Self-pay | Admitting: Internal Medicine

## 2023-11-18 ENCOUNTER — Other Ambulatory Visit: Payer: Self-pay | Admitting: Internal Medicine

## 2023-12-24 ENCOUNTER — Ambulatory Visit (INDEPENDENT_AMBULATORY_CARE_PROVIDER_SITE_OTHER): Admitting: Neurology

## 2023-12-24 ENCOUNTER — Ambulatory Visit: Payer: Self-pay | Admitting: Neurology

## 2023-12-24 DIAGNOSIS — R202 Paresthesia of skin: Secondary | ICD-10-CM

## 2023-12-24 DIAGNOSIS — G5603 Carpal tunnel syndrome, bilateral upper limbs: Secondary | ICD-10-CM

## 2023-12-24 DIAGNOSIS — G629 Polyneuropathy, unspecified: Secondary | ICD-10-CM

## 2023-12-24 NOTE — Procedures (Signed)
 [facility]  [address], [address]  [place], [place] [address] Tel: [phone] Fax: [phone] Test Date:  12/24/2023  Patient: Harold Pittman DOB: July 27, 1940 Physician: Venetia Potters, MD  Sex: Male Height: 6' 0 Ref Phys: Asberry Schneider, DO  ID#: [id number]   Technician:    History: This is an 83 year old male with weakness, paresthesias, and gait abnormality.  NCV & EMG Findings: Extensive electrodiagnostic evaluation of the right upper and lower limbs shows: Right sural, superficial peroneal, median, and ulnar sensory responses are absent. Right radial sensory response shows reduced amplitude (3 V). Right peroneal/fibular (EDB), tibial (AH), and median (APB) motor responses are absent. Right peroneal/fibular (TA) and ulnar (ADM) show reduced amplitude (TA 0.77, ADM 6.5 mV). Right H reflex is absent. Chronic motor axon loss changes WITH accompanying active denervation changes are seen in the right tibialis anterior muscle. Chronic motor axon loss changes WITHOUT accompanying active denervation changes are seen in the right medial head of gastrocnemius, short head of biceps femoris, and first dorsal interosseous muscles. No motor units are seen in the right abductor pollicis brevis muscle.  Neuromuscular Ultrasound Findings: High frequency (4.0-16.0 MHz) B-mode, nonvascular ultrasound of the bilateral upper limbs shows: Cross sectional areas (CSA) of bilateral median (wrist to forearm) are increased at the wrist bilaterally (right 17.4, left 15.5 mm2). Wrist to forearm ratios of bilateral median nerves is increased (right 3.41, left 2.87). No other obvious lesion involving the adjacent bone or tendon is identified. No definite vascular abnormalities.  Impression: This is an abnormal study. 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 Ultrasonographic evidence of  bilateral median mononeuropathy at or distal to the wrist, consistent with carpal tunnel syndrome. Severity is difficult to grade due to #1 above. No definitive electrodiagnostic evidence of a right cervical (C5-T1) or right lumbosacral (L3-S1) motor radiculopathy.   ___________________________ Venetia Potters, MD    NCS+ Motor Nerve Results    Latency Amplitude F-Lat Segment Distance CV Comment  Site (ms) Norm (mV) Norm (ms)  (cm) (m/s) Norm   Right Fibular (EDB) Motor  Ankle *NR  < 6.0 *NR  > 2.5        Bel fib head *NR - *NR -  Bel fib head-Ankle - *NR  > 40   Pop fossa *NR - *NR -  Pop fossa-Bel fib head - *NR -   Right Fibular (TA) Motor  Fib head 2.1  < 4.5 *0.77  > 3.0        Pop fossa *NR  < 6.7 *NR -  Pop fossa-Fib head - *NR  > 40   Right Median (APB) Motor  Wrist *NR  < 4.0 *NR  > 5.0        Elbow *NR - *NR -  Elbow-Wrist - *NR  > 50   Right Tibial (AH) Motor  Ankle *NR  < 6.0 *NR  > 4.0        Knee *NR - *NR -  Knee-Ankle - *NR  > 40   Right Ulnar (ADM) Motor  Wrist 2.6  < 3.1 *6.5  > 7.0        Bel elbow 7.6 - 5.9 -  Bel elbow-Wrist 26 52  > 50   Ab elbow 9.6 - 5.8 -  Ab elbow-Bel elbow 10 50 -    Sensory Sites    Neg Peak Lat Amplitude (O-P) Segment Distance  Velocity Comment  Site (ms) Norm (V) Norm  (cm) (ms)   Right Median Sensory  Wrist-Dig II *NR  < 3.8 *NR  > 10 Wrist-Dig II 13    Right Radial Sensory  Forearm-Wrist 2.5  < 2.8 *3  > 10 Forearm-Wrist 10    Right Superficial Fibular Sensory  14 cm-Ankle *NR  < 4.6 *NR  > 3 14 cm-Ankle 14    Right Sural Sensory  Calf-Lat mall *NR  < 4.6 *NR  > 3 Calf-Lat mall 14    Right Ulnar Sensory  Wrist-Dig V *NR  < 3.2 *NR  > 5 Wrist-Dig V 11     H-Reflex Results    M-Lat H Lat H Neg Amp H-M Lat  Site (ms) (ms) Norm (mV) (ms)  Right Tibial H-Reflex  Pop fossa 5.0 --  < 35.0 -- --   EMG+   Side Muscle Ins.Act Fibs Fasc Recrt Amp Dur Poly Activation Comment  Right Tib ant Nml *1+ Nml *SMU *2+ *2+ *1+ Nml N/A   Right Gastroc MH Nml Nml Nml *2- *1+ *1+ *1+ Nml N/A  Right Rectus fem Nml Nml Nml Nml Nml Nml Nml Nml N/A  Right Biceps fem SH Nml Nml Nml *1- *1+ *1+ *1+ Nml N/A  Right Gluteus med Nml Nml Nml Nml Nml Nml Nml Nml N/A  Right Gluteus max Nml Nml Nml Nml Nml Nml Nml Nml N/A  Right L5 PSP Nml Nml Nml Nml Nml Nml Nml Nml N/A  Right FDI Nml Nml Nml *1- *1+ *1+ Nml Nml N/A  Right EIP Nml Nml Nml Nml Nml Nml Nml Nml N/A  Right APB Nml Nml Nml *None *- *- *- *2- *ATR  Right Pronator teres Nml Nml Nml Nml Nml Nml Nml Nml N/A  Right Biceps Nml Nml Nml Nml Nml Nml Nml Nml N/A  Right Triceps lat hd Nml Nml Nml Nml Nml Nml Nml Nml N/A  Right Deltoid Nml Nml Nml Nml Nml Nml Nml Nml N/A   Nerve Measurements   Site Area Segment Area Ratio Mobility Vascularity Comment   mm Norm   Norm     Left Median  Wrist *15.5  < 13.0         Forearm 5.4  < 10.7  Wrist - Forearm *2.87  < 1.50      Right Median  Wrist *17.4  < 13.0         Forearm 5.1  < 10.7  Wrist - Forearm *3.41  < 1.50          Waveforms:  Motor             Sensory             H-Reflex

## 2023-12-30 ENCOUNTER — Encounter: Payer: Self-pay | Admitting: Neurology

## 2024-01-04 ENCOUNTER — Ambulatory Visit
Admission: RE | Admit: 2024-01-04 | Discharge: 2024-01-04 | Disposition: A | Discharge status: Home / Self Care | Source: Ambulatory Visit | Attending: Neurology | Admitting: Neurology

## 2024-01-04 ENCOUNTER — Ambulatory Visit
Admission: RE | Admit: 2024-01-04 | Discharge: 2024-01-04 | Disposition: A | Source: Ambulatory Visit | Attending: Neurology | Admitting: Neurology

## 2024-01-04 DIAGNOSIS — E1142 Type 2 diabetes mellitus with diabetic polyneuropathy: Secondary | ICD-10-CM

## 2024-01-04 DIAGNOSIS — I6381 Other cerebral infarction due to occlusion or stenosis of small artery: Secondary | ICD-10-CM | POA: Diagnosis not present

## 2024-01-04 DIAGNOSIS — M4726 Other spondylosis with radiculopathy, lumbar region: Secondary | ICD-10-CM | POA: Diagnosis not present

## 2024-01-04 DIAGNOSIS — M48061 Spinal stenosis, lumbar region without neurogenic claudication: Secondary | ICD-10-CM | POA: Diagnosis not present

## 2024-01-04 DIAGNOSIS — M5116 Intervertebral disc disorders with radiculopathy, lumbar region: Secondary | ICD-10-CM | POA: Diagnosis not present

## 2024-01-04 DIAGNOSIS — R270 Ataxia, unspecified: Secondary | ICD-10-CM

## 2024-01-06 ENCOUNTER — Ambulatory Visit: Payer: Self-pay | Admitting: Neurology

## 2024-01-06 NOTE — Progress Notes (Signed)
 Let pt know that MRI brain shows old strokes and age related changes.  MRI lumbar spine with degenerative and arthritic changes.  However, the MRI lumbar spine also caught the kidneys and that showed some complex cysts on the kidneys and further testing is recommended with dedicated renal mass MRI protocol.  Tell him that this is far out of my field of expertise but that we will order this but likely will need followed up on by others/pcp.  Please order this if patient agreeable with dx of renal mass

## 2024-01-10 NOTE — Progress Notes (Signed)
Called and left patient a voicemail message 

## 2024-01-22 NOTE — Progress Notes (Signed)
 Called and left patient voicemail

## 2024-01-24 ENCOUNTER — Other Ambulatory Visit: Payer: Self-pay

## 2024-01-24 DIAGNOSIS — Q6102 Congenital multiple renal cysts: Secondary | ICD-10-CM

## 2024-01-24 DIAGNOSIS — R9389 Abnormal findings on diagnostic imaging of other specified body structures: Secondary | ICD-10-CM

## 2024-01-24 NOTE — Addendum Note (Signed)
 Addended by: MERCER CREE C on: 01/24/2024 09:44 AM   Modules accepted: Orders

## 2024-01-24 NOTE — Progress Notes (Signed)
 I was able to reach patients son and he agreed to MRI order has been put in

## 2024-02-05 ENCOUNTER — Encounter: Payer: Self-pay | Admitting: Neurology

## 2024-02-09 ENCOUNTER — Ambulatory Visit
Admission: RE | Admit: 2024-02-09 | Discharge: 2024-02-09 | Disposition: A | Discharge status: Home / Self Care | Source: Ambulatory Visit | Attending: Neurology | Admitting: Neurology

## 2024-02-09 DIAGNOSIS — R9389 Abnormal findings on diagnostic imaging of other specified body structures: Secondary | ICD-10-CM

## 2024-02-09 DIAGNOSIS — N281 Cyst of kidney, acquired: Secondary | ICD-10-CM | POA: Diagnosis not present

## 2024-02-09 DIAGNOSIS — Q6102 Congenital multiple renal cysts: Secondary | ICD-10-CM

## 2024-02-10 ENCOUNTER — Ambulatory Visit: Payer: Self-pay | Admitting: Neurology

## 2024-02-12 DIAGNOSIS — H26493 Other secondary cataract, bilateral: Secondary | ICD-10-CM | POA: Diagnosis not present

## 2024-02-12 DIAGNOSIS — H10413 Chronic giant papillary conjunctivitis, bilateral: Secondary | ICD-10-CM | POA: Diagnosis not present

## 2024-02-12 DIAGNOSIS — H04123 Dry eye syndrome of bilateral lacrimal glands: Secondary | ICD-10-CM | POA: Diagnosis not present

## 2024-02-12 LAB — OPHTHALMOLOGY REPORT-SCANNED

## 2024-02-13 ENCOUNTER — Other Ambulatory Visit: Payer: Self-pay | Admitting: Internal Medicine

## 2024-02-18 DIAGNOSIS — H26491 Other secondary cataract, right eye: Secondary | ICD-10-CM | POA: Diagnosis not present

## 2024-03-04 ENCOUNTER — Encounter: Payer: Self-pay | Admitting: Internal Medicine

## 2024-03-04 ENCOUNTER — Ambulatory Visit: Admitting: Internal Medicine

## 2024-03-04 VITALS — BP 146/82 | HR 62 | Ht 72.0 in | Wt 169.8 lb

## 2024-03-04 DIAGNOSIS — I2510 Atherosclerotic heart disease of native coronary artery without angina pectoris: Secondary | ICD-10-CM

## 2024-03-04 DIAGNOSIS — Z1152 Encounter for screening for COVID-19: Secondary | ICD-10-CM | POA: Diagnosis not present

## 2024-03-04 DIAGNOSIS — N184 Chronic kidney disease, stage 4 (severe): Secondary | ICD-10-CM | POA: Diagnosis not present

## 2024-03-04 DIAGNOSIS — E1122 Type 2 diabetes mellitus with diabetic chronic kidney disease: Secondary | ICD-10-CM

## 2024-03-04 DIAGNOSIS — E1151 Type 2 diabetes mellitus with diabetic peripheral angiopathy without gangrene: Secondary | ICD-10-CM

## 2024-03-04 DIAGNOSIS — R269 Unspecified abnormalities of gait and mobility: Secondary | ICD-10-CM

## 2024-03-04 DIAGNOSIS — N281 Cyst of kidney, acquired: Secondary | ICD-10-CM | POA: Diagnosis not present

## 2024-03-04 LAB — COMPREHENSIVE METABOLIC PANEL WITH GFR
ALT: 18 U/L (ref 0–53)
AST: 22 U/L (ref 0–37)
Albumin: 4.4 g/dL (ref 3.5–5.2)
Alkaline Phosphatase: 93 U/L (ref 39–117)
BUN: 39 mg/dL — ABNORMAL HIGH (ref 6–23)
CO2: 30 meq/L (ref 19–32)
Calcium: 9.5 mg/dL (ref 8.4–10.5)
Chloride: 105 meq/L (ref 96–112)
Creatinine, Ser: 2.96 mg/dL — ABNORMAL HIGH (ref 0.40–1.50)
GFR: 19.01 mL/min — ABNORMAL LOW (ref >60.00)
Glucose, Bld: 113 mg/dL — ABNORMAL HIGH (ref 70–99)
Potassium: 4.5 meq/L (ref 3.5–5.1)
Sodium: 143 meq/L (ref 135–145)
Total Bilirubin: 0.5 mg/dL (ref 0.2–1.2)
Total Protein: 7.4 g/dL (ref 6.0–8.3)

## 2024-03-04 LAB — HEMOGLOBIN A1C: Hgb A1c MFr Bld: 5.6 % (ref 4.6–6.5)

## 2024-03-04 LAB — URIC ACID: Uric Acid, Serum: 7.5 mg/dL (ref 4.0–7.8)

## 2024-03-04 LAB — POC COVID19 BINAXNOW: SARS Coronavirus 2 Ag: NEGATIVE

## 2024-03-04 NOTE — Assessment & Plan Note (Addendum)
 Pt saw Dr Tat, had EMG, MRI Neuropathy Use a walker

## 2024-03-04 NOTE — Assessment & Plan Note (Signed)
Cont w/ASA, Pravachol, Coreg  No angina 

## 2024-03-04 NOTE — Assessment & Plan Note (Signed)
 MRI:  Multiple bilateral renal cysts, some hemorrhagic/proteinaceous and a few minimally septated (Bosniak I-II), as before. A few of the previously noted cysts have involuted or developed interval hemorrhage, benign.

## 2024-03-04 NOTE — Assessment & Plan Note (Signed)
 2025 MRI:  Multiple bilateral renal cysts, some hemorrhagic/proteinaceous and a few minimally septated (Bosniak I-II), as before. A few of the previously noted cysts have involuted or developed interval hemorrhage, benign.

## 2024-03-04 NOTE — Assessment & Plan Note (Signed)
 Cold and numb hands - long time; PVD and neuropathy. Pt saw Dr Arbutus Leas [facility]

## 2024-03-04 NOTE — Progress Notes (Signed)
 Subjective:  Patient ID: Harold Pittman, male    DOB: Jan 16, 1941  Age: 83 y.o. MRN: [id number]  CC: Results (Discuss findings of MRI)   HPI Harold Pittman presents for URI sx's - better F/u abn LS MRI, kidney cysts, gout F/u on neuropathy and gait disorder He is here with his son Harold Pittman  Outpatient Medications Prior to Visit  Medication Sig Dispense Refill   acetaminophen -codeine  (TYLENOL  #3) 300-30 MG tablet Take 1 tablet by mouth every 6 (six) hours as needed for severe pain (pain score 7-10). 20 tablet 1   allopurinol  (ZYLOPRIM ) 100 MG tablet TAKE 1/2 TABLET BY MOUTH EVERY DAY 90 tablet 1   amLODipine  (NORVASC ) 5 MG tablet TAKE 1 TABLET(5 MG) BY MOUTH DAILY 90 tablet 2   aspirin  81 MG EC tablet Take 1 tablet (81 mg total) by mouth daily. 100 tablet 3   B Complex-Folic Acid  (B COMPLEX PLUS) TABS Take 1 tablet by mouth daily. 100 tablet 3   Blood Glucose Monitoring Suppl (ONETOUCH VERIO FLEX SYSTEM) w/Device KIT CHECK BLOOD SUGARS 2 TIMES A DAY. 1 kit 0   carvedilol  (COREG ) 25 MG tablet TAKE 1 TABLET BY MOUTH 2 TIMES DAILY WITH A MEAL 180 tablet 3   Cholecalciferol  (VITAMIN D3) 50 MCG (2000 UT) capsule Take 1 capsule (2,000 Units total) by mouth daily. 100 capsule 3   colchicine  0.6 MG tablet TAKE 1 TABLET(0.6 MG) BY MOUTH DAILY 90 tablet 3   famotidine  (PEPCID ) 40 MG tablet TAKE 1 TABLET BY MOUTH DAILY 90 tablet 3   ferrous sulfate  325 (65 FE) MG tablet Take 325 mg by mouth at bedtime.     glucose blood test strip Use as instructed 100 each 12   Lancets (ONETOUCH ULTRASOFT) lancets Use as instructed 100 each 12   omeprazole  (PRILOSEC) 40 MG capsule TAKE ONE CAPSULE BY MOUTH DAILY 90 capsule 3   pravastatin  (PRAVACHOL ) 80 MG tablet TAKE 1 TABLET BY MOUTH AT BEDTIME 90 tablet 3   repaglinide  (PRANDIN ) 1 MG tablet TAKE 1 TABLET BY MOUTH BEFORE OR WITH DINNER 90 tablet 3   torsemide  (DEMADEX ) 100 MG tablet TAKE 1 TABLET BY MOUTH DAILY 90 tablet 1   No facility-administered medications  prior to visit.    ROS: Review of Systems  Constitutional:  Negative for appetite change, fatigue and unexpected weight change.  HENT:  Negative for congestion, nosebleeds, sneezing, sore throat and trouble swallowing.   Eyes:  Negative for itching and visual disturbance.  Respiratory:  Negative for cough.   Cardiovascular:  Negative for chest pain, palpitations and leg swelling.  Gastrointestinal:  Negative for abdominal distention, blood in stool, diarrhea and nausea.  Genitourinary:  Negative for frequency and hematuria.  Musculoskeletal:  Negative for back pain, gait problem, joint swelling and neck pain.  Skin:  Negative for rash.  Neurological:  Positive for weakness. Negative for dizziness, tremors and speech difficulty.  Psychiatric/Behavioral:  Negative for agitation, dysphoric mood, sleep disturbance and suicidal ideas. The patient is not nervous/anxious.     Objective:  BP (!) 146/82   Pulse 62   Ht 6' (1.829 m)   Wt 169 lb 12.8 oz (77 kg)   SpO2 97%   BMI 23.03 kg/m   BP Readings from Last 3 Encounters:  03/04/24 (!) 146/82  10/28/23 124/76  10/21/23 110/60    Wt Readings from Last 3 Encounters:  03/04/24 169 lb 12.8 oz (77 kg)  10/28/23 171 lb 6.4 oz (77.7 kg)  10/21/23 169  lb (76.7 kg)    Physical Exam Constitutional:      General: He is not in acute distress.    Appearance: He is well-developed.     Comments: NAD  Eyes:     Conjunctiva/sclera: Conjunctivae normal.     Pupils: Pupils are equal, round, and reactive to light.  Neck:     Thyroid : No thyromegaly.     Vascular: No JVD.  Cardiovascular:     Rate and Rhythm: Normal rate and regular rhythm.     Heart sounds: Normal heart sounds. No murmur heard.    No friction rub. No gallop.  Pulmonary:     Effort: Pulmonary effort is normal. No respiratory distress.     Breath sounds: Normal breath sounds. No wheezing or rales.  Chest:     Chest wall: No tenderness.  Abdominal:     General: Bowel  sounds are normal. There is no distension.     Palpations: Abdomen is soft. There is no mass.     Tenderness: There is no abdominal tenderness. There is no guarding or rebound.  Musculoskeletal:        General: No tenderness. Normal range of motion.     Cervical back: Normal range of motion.  Lymphadenopathy:     Cervical: No cervical adenopathy.  Skin:    General: Skin is warm and dry.     Findings: No rash.  Neurological:     Mental Status: He is alert and oriented to person, place, and time.     Cranial Nerves: No cranial nerve deficit.     Motor: Weakness present. No abnormal muscle tone.     Coordination: Coordination normal.     Gait: Gait abnormal.     Deep Tendon Reflexes: Reflexes are normal and symmetric.  Psychiatric:        Behavior: Behavior normal.        Thought Content: Thought content normal.        Judgment: Judgment normal.    In a wheelchair  Lab Results  Component Value Date   WBC 5.8 10/21/2023   HGB 12.1 (L) 10/21/2023   HCT 35.7 (L) 10/21/2023   PLT 194.0 10/21/2023   GLUCOSE 113 (H) 03/04/2024   CHOL 120 07/21/2019   TRIG 68.0 07/21/2019   HDL 31.70 (L) 07/21/2019   LDLCALC 75 07/21/2019   ALT 18 03/04/2024   AST 22 03/04/2024   NA 143 03/04/2024   K 4.5 03/04/2024   CL 105 03/04/2024   CREATININE 2.96 (H) 03/04/2024   BUN 39 (H) 03/04/2024   CO2 30 03/04/2024   TSH 0.70 08/01/2022   PSA 8.92 (H) 04/22/2019   INR 1.35 07/23/2015   HGBA1C 5.6 03/04/2024   MICROALBUR 57.7 (H) 03/22/2010    MR ABDOMEN WO CONTRAST Result Date: 02/09/2024 CLINICAL DATA:  Follow-up renal cysts EXAM: MRI ABDOMEN WITHOUT CONTRAST TECHNIQUE: Multiplanar multisequence MR imaging was performed without the administration of intravenous contrast. COMPARISON:  MRI abdomen dated 03/31/2021 FINDINGS: Lower chest: No acute findings. Hepatobiliary: Innumerable scattered subcentimeter T2 hyperintense foci in the partially imaged liver, likely cysts. No bile duct dilation.  Cholelithiasis. Pancreas: No mass, inflammatory changes, or other parenchymal abnormality identified. Spleen:  Within normal limits in size and appearance. Adrenals/Urinary Tract: No adrenal nodules. Again seen are multiple bilateral cysts, including a few hemorrhagic/proteinaceous and septated measuring up to 5.4 x 3.8 cm in the anterior interpolar right kidney (8:19), unchanged. A few of the previously noted cysts have involuted, for example a  1.0 cm previously hemorrhagic cyst in the anteromedial interpolar right kidney now measures 0.6 cm (8:14) and a posterior lower pole left renal cyst measuring 1.4 cm now measures 0.7 cm (8:17) and demonstrates intrinsic T1 signal (9:20) and restricted diffusion (6:64). Stomach/Bowel: Visualized portions within the abdomen are unremarkable. Vascular/Lymphatic: No pathologically enlarged lymph nodes identified. No abdominal aortic aneurysm demonstrated. Other:  None. Musculoskeletal: No suspicious bone lesions identified. IMPRESSION: 1. Multiple bilateral renal cysts, some hemorrhagic/proteinaceous and a few minimally septated (Bosniak I-II), as before. A few of the previously noted cysts have involuted or developed interval hemorrhage, benign. 2. Cholelithiasis. Electronically Signed   By: Limin  Xu M.D.   On: 02/09/2024 09:53    Assessment & Plan:   Problem List Items Addressed This Visit     CKD (chronic kidney disease) stage 4, GFR 15-29 ml/min (HCC) - Primary   MRI:  Multiple bilateral renal cysts, some hemorrhagic/proteinaceous and a few minimally septated (Bosniak I-II), as before. A few of the previously noted cysts have involuted or developed interval hemorrhage, benign.       Relevant Orders   Immunofixation electrophoresis   Comprehensive metabolic panel with GFR (Completed)   Coronary artery disease involving native coronary artery of native heart without angina pectoris   Cont w/ASA, Pravachol , Coreg   No angina      DM (diabetes mellitus),  type 2 with peripheral vascular complications (HCC)   Cold and numb hands - long time; PVD and neuropathy. Pt saw Dr Evonnie [facility]       Relevant Orders   Comprehensive metabolic panel with GFR (Completed)   Uric acid (Completed)   Hemoglobin A1c (Completed)   Gait disorder   Pt saw Dr Tat, had EMG, MRI Neuropathy Use a walker       Relevant Orders   Immunofixation electrophoresis   Comprehensive metabolic panel with GFR (Completed)   Uric acid (Completed)   Renal cyst   2025 MRI:  Multiple bilateral renal cysts, some hemorrhagic/proteinaceous and a few minimally septated (Bosniak I-II), as before. A few of the previously noted cysts have involuted or developed interval hemorrhage, benign.      Other Visit Diagnoses       Encounter for screening for COVID-19       Relevant Orders   POC COVID-19 (Completed)         No orders of the defined types were placed in this encounter.     Follow-up: Return in about 3 months (around 06/04/2024) for a follow-up visit.  Marolyn Noel, MD

## 2024-03-10 LAB — IMMUNOFIXATION ELECTROPHORESIS
IgG (Immunoglobin G), Serum: 952 mg/dL (ref 600–1540)
IgM, Serum: 66 mg/dL (ref 50–300)
Immunoglobulin A: 112 mg/dL (ref 70–320)

## 2024-03-11 ENCOUNTER — Ambulatory Visit: Payer: Self-pay | Admitting: Internal Medicine

## 2024-04-14 ENCOUNTER — Other Ambulatory Visit: Payer: Self-pay | Admitting: Internal Medicine

## 2024-04-17 ENCOUNTER — Other Ambulatory Visit: Payer: Self-pay | Admitting: Internal Medicine

## 2024-08-17 ENCOUNTER — Ambulatory Visit
